# Patient Record
Sex: Female | Born: 1935 | Race: White | Hispanic: No | Smoking: Former smoker
Health system: Southern US, Community
[De-identification: ages and names within clinical notes are randomized; demographics above are authoritative.]

## PROBLEM LIST (undated history)

## (undated) DIAGNOSIS — C801 Malignant (primary) neoplasm, unspecified: Secondary | ICD-10-CM

## (undated) DIAGNOSIS — M1711 Unilateral primary osteoarthritis, right knee: Secondary | ICD-10-CM

## (undated) DIAGNOSIS — M1990 Unspecified osteoarthritis, unspecified site: Secondary | ICD-10-CM

## (undated) DIAGNOSIS — J45909 Unspecified asthma, uncomplicated: Secondary | ICD-10-CM

## (undated) DIAGNOSIS — R05 Cough: Secondary | ICD-10-CM

## (undated) DIAGNOSIS — K5790 Diverticulosis of intestine, part unspecified, without perforation or abscess without bleeding: Secondary | ICD-10-CM

## (undated) DIAGNOSIS — G4730 Sleep apnea, unspecified: Secondary | ICD-10-CM

## (undated) DIAGNOSIS — K589 Irritable bowel syndrome without diarrhea: Secondary | ICD-10-CM

## (undated) DIAGNOSIS — M1712 Unilateral primary osteoarthritis, left knee: Secondary | ICD-10-CM

## (undated) DIAGNOSIS — H269 Unspecified cataract: Secondary | ICD-10-CM

## (undated) DIAGNOSIS — M47816 Spondylosis without myelopathy or radiculopathy, lumbar region: Secondary | ICD-10-CM

## (undated) DIAGNOSIS — R053 Chronic cough: Secondary | ICD-10-CM

## (undated) DIAGNOSIS — C8300 Small cell B-cell lymphoma, unspecified site: Secondary | ICD-10-CM

## (undated) HISTORY — DX: Unilateral primary osteoarthritis, right knee: M17.11

## (undated) HISTORY — DX: Cough: R05

## (undated) HISTORY — PX: KNEE ARTHROSCOPY: SUR90

## (undated) HISTORY — DX: Spondylosis without myelopathy or radiculopathy, lumbar region: M47.816

## (undated) HISTORY — DX: Unilateral primary osteoarthritis, left knee: M17.12

## (undated) HISTORY — PX: TONSILLECTOMY AND ADENOIDECTOMY: SUR1326

## (undated) HISTORY — DX: Unspecified asthma, uncomplicated: J45.909

## (undated) HISTORY — DX: Unspecified osteoarthritis, unspecified site: M19.90

## (undated) HISTORY — DX: Chronic cough: R05.3

## (undated) HISTORY — PX: EYE SURGERY: SHX253

## (undated) HISTORY — DX: Irritable bowel syndrome, unspecified: K58.9

## (undated) HISTORY — DX: Diverticulosis of intestine, part unspecified, without perforation or abscess without bleeding: K57.90

## (undated) HISTORY — DX: Small cell B-cell lymphoma, unspecified site: C83.00

## (undated) HISTORY — DX: Unspecified cataract: H26.9

---

## 1982-08-03 HISTORY — PX: ABDOMINAL HYSTERECTOMY: SHX81

## 2002-08-03 DIAGNOSIS — C8300 Small cell B-cell lymphoma, unspecified site: Secondary | ICD-10-CM

## 2002-08-03 HISTORY — DX: Small cell B-cell lymphoma, unspecified site: C83.00

## 2011-08-26 DIAGNOSIS — IMO0002 Reserved for concepts with insufficient information to code with codable children: Secondary | ICD-10-CM | POA: Diagnosis not present

## 2011-08-26 DIAGNOSIS — IMO0001 Reserved for inherently not codable concepts without codable children: Secondary | ICD-10-CM | POA: Diagnosis not present

## 2011-09-09 DIAGNOSIS — IMO0001 Reserved for inherently not codable concepts without codable children: Secondary | ICD-10-CM | POA: Diagnosis not present

## 2011-09-09 DIAGNOSIS — IMO0002 Reserved for concepts with insufficient information to code with codable children: Secondary | ICD-10-CM | POA: Diagnosis not present

## 2011-10-01 DIAGNOSIS — E7800 Pure hypercholesterolemia, unspecified: Secondary | ICD-10-CM | POA: Diagnosis not present

## 2011-10-01 DIAGNOSIS — R32 Unspecified urinary incontinence: Secondary | ICD-10-CM | POA: Diagnosis not present

## 2011-10-01 DIAGNOSIS — I10 Essential (primary) hypertension: Secondary | ICD-10-CM | POA: Diagnosis not present

## 2011-10-01 DIAGNOSIS — C8299 Follicular lymphoma, unspecified, extranodal and solid organ sites: Secondary | ICD-10-CM | POA: Diagnosis not present

## 2011-10-12 DIAGNOSIS — M25569 Pain in unspecified knee: Secondary | ICD-10-CM | POA: Diagnosis not present

## 2011-10-12 DIAGNOSIS — M19049 Primary osteoarthritis, unspecified hand: Secondary | ICD-10-CM | POA: Diagnosis not present

## 2011-10-12 DIAGNOSIS — IMO0002 Reserved for concepts with insufficient information to code with codable children: Secondary | ICD-10-CM | POA: Diagnosis not present

## 2011-10-19 DIAGNOSIS — IMO0002 Reserved for concepts with insufficient information to code with codable children: Secondary | ICD-10-CM | POA: Diagnosis not present

## 2011-10-19 DIAGNOSIS — IMO0001 Reserved for inherently not codable concepts without codable children: Secondary | ICD-10-CM | POA: Diagnosis not present

## 2011-10-21 DIAGNOSIS — M25569 Pain in unspecified knee: Secondary | ICD-10-CM | POA: Diagnosis not present

## 2011-10-21 DIAGNOSIS — IMO0002 Reserved for concepts with insufficient information to code with codable children: Secondary | ICD-10-CM | POA: Diagnosis not present

## 2011-10-21 DIAGNOSIS — M79609 Pain in unspecified limb: Secondary | ICD-10-CM | POA: Diagnosis not present

## 2011-10-21 DIAGNOSIS — Z87898 Personal history of other specified conditions: Secondary | ICD-10-CM | POA: Diagnosis not present

## 2011-10-21 DIAGNOSIS — M25869 Other specified joint disorders, unspecified knee: Secondary | ICD-10-CM | POA: Diagnosis not present

## 2011-10-22 DIAGNOSIS — IMO0002 Reserved for concepts with insufficient information to code with codable children: Secondary | ICD-10-CM | POA: Diagnosis not present

## 2011-10-22 DIAGNOSIS — M23302 Other meniscus derangements, unspecified lateral meniscus, unspecified knee: Secondary | ICD-10-CM | POA: Diagnosis not present

## 2011-10-22 DIAGNOSIS — M2240 Chondromalacia patellae, unspecified knee: Secondary | ICD-10-CM | POA: Diagnosis not present

## 2011-10-26 DIAGNOSIS — M23329 Other meniscus derangements, posterior horn of medial meniscus, unspecified knee: Secondary | ICD-10-CM | POA: Diagnosis not present

## 2011-10-30 DIAGNOSIS — IMO0002 Reserved for concepts with insufficient information to code with codable children: Secondary | ICD-10-CM | POA: Diagnosis not present

## 2011-10-30 DIAGNOSIS — K219 Gastro-esophageal reflux disease without esophagitis: Secondary | ICD-10-CM | POA: Diagnosis not present

## 2011-10-30 DIAGNOSIS — J449 Chronic obstructive pulmonary disease, unspecified: Secondary | ICD-10-CM | POA: Diagnosis not present

## 2011-10-30 DIAGNOSIS — S83289A Other tear of lateral meniscus, current injury, unspecified knee, initial encounter: Secondary | ICD-10-CM | POA: Diagnosis not present

## 2011-10-30 DIAGNOSIS — Z87891 Personal history of nicotine dependence: Secondary | ICD-10-CM | POA: Diagnosis not present

## 2011-10-30 DIAGNOSIS — Z79899 Other long term (current) drug therapy: Secondary | ICD-10-CM | POA: Diagnosis not present

## 2011-10-30 DIAGNOSIS — G4733 Obstructive sleep apnea (adult) (pediatric): Secondary | ICD-10-CM | POA: Diagnosis not present

## 2011-10-30 DIAGNOSIS — M2240 Chondromalacia patellae, unspecified knee: Secondary | ICD-10-CM | POA: Diagnosis not present

## 2011-10-30 DIAGNOSIS — I10 Essential (primary) hypertension: Secondary | ICD-10-CM | POA: Diagnosis not present

## 2011-10-30 DIAGNOSIS — E119 Type 2 diabetes mellitus without complications: Secondary | ICD-10-CM | POA: Diagnosis not present

## 2011-10-30 DIAGNOSIS — Z888 Allergy status to other drugs, medicaments and biological substances status: Secondary | ICD-10-CM | POA: Diagnosis not present

## 2011-10-30 DIAGNOSIS — Z8701 Personal history of pneumonia (recurrent): Secondary | ICD-10-CM | POA: Diagnosis not present

## 2011-10-30 DIAGNOSIS — I4510 Unspecified right bundle-branch block: Secondary | ICD-10-CM | POA: Diagnosis not present

## 2011-10-30 DIAGNOSIS — Z882 Allergy status to sulfonamides status: Secondary | ICD-10-CM | POA: Diagnosis not present

## 2011-10-30 DIAGNOSIS — Z9221 Personal history of antineoplastic chemotherapy: Secondary | ICD-10-CM | POA: Diagnosis not present

## 2011-10-30 DIAGNOSIS — Z7982 Long term (current) use of aspirin: Secondary | ICD-10-CM | POA: Diagnosis not present

## 2011-10-30 DIAGNOSIS — H269 Unspecified cataract: Secondary | ICD-10-CM | POA: Diagnosis not present

## 2011-10-30 DIAGNOSIS — C8589 Other specified types of non-Hodgkin lymphoma, extranodal and solid organ sites: Secondary | ICD-10-CM | POA: Diagnosis not present

## 2011-10-30 DIAGNOSIS — B029 Zoster without complications: Secondary | ICD-10-CM | POA: Diagnosis not present

## 2011-11-06 DIAGNOSIS — S83289A Other tear of lateral meniscus, current injury, unspecified knee, initial encounter: Secondary | ICD-10-CM | POA: Diagnosis not present

## 2011-11-06 DIAGNOSIS — M23349 Other meniscus derangements, anterior horn of lateral meniscus, unspecified knee: Secondary | ICD-10-CM | POA: Diagnosis not present

## 2011-11-06 DIAGNOSIS — IMO0002 Reserved for concepts with insufficient information to code with codable children: Secondary | ICD-10-CM | POA: Diagnosis not present

## 2011-11-06 DIAGNOSIS — Z6841 Body Mass Index (BMI) 40.0 and over, adult: Secondary | ICD-10-CM | POA: Diagnosis not present

## 2011-11-06 DIAGNOSIS — M2240 Chondromalacia patellae, unspecified knee: Secondary | ICD-10-CM | POA: Diagnosis not present

## 2011-11-06 DIAGNOSIS — M23329 Other meniscus derangements, posterior horn of medial meniscus, unspecified knee: Secondary | ICD-10-CM | POA: Diagnosis not present

## 2011-11-06 DIAGNOSIS — I10 Essential (primary) hypertension: Secondary | ICD-10-CM | POA: Diagnosis not present

## 2011-11-06 DIAGNOSIS — J449 Chronic obstructive pulmonary disease, unspecified: Secondary | ICD-10-CM | POA: Diagnosis not present

## 2011-11-06 DIAGNOSIS — G4733 Obstructive sleep apnea (adult) (pediatric): Secondary | ICD-10-CM | POA: Diagnosis not present

## 2011-11-06 DIAGNOSIS — M23359 Other meniscus derangements, posterior horn of lateral meniscus, unspecified knee: Secondary | ICD-10-CM | POA: Diagnosis not present

## 2011-11-07 DIAGNOSIS — I10 Essential (primary) hypertension: Secondary | ICD-10-CM | POA: Diagnosis not present

## 2011-11-07 DIAGNOSIS — S83289A Other tear of lateral meniscus, current injury, unspecified knee, initial encounter: Secondary | ICD-10-CM | POA: Diagnosis not present

## 2011-11-07 DIAGNOSIS — G4733 Obstructive sleep apnea (adult) (pediatric): Secondary | ICD-10-CM | POA: Diagnosis not present

## 2011-11-07 DIAGNOSIS — IMO0002 Reserved for concepts with insufficient information to code with codable children: Secondary | ICD-10-CM | POA: Diagnosis not present

## 2011-11-07 DIAGNOSIS — J449 Chronic obstructive pulmonary disease, unspecified: Secondary | ICD-10-CM | POA: Diagnosis not present

## 2011-11-07 DIAGNOSIS — M2240 Chondromalacia patellae, unspecified knee: Secondary | ICD-10-CM | POA: Diagnosis not present

## 2011-11-09 DIAGNOSIS — Z4789 Encounter for other orthopedic aftercare: Secondary | ICD-10-CM | POA: Diagnosis not present

## 2011-11-09 DIAGNOSIS — Z9981 Dependence on supplemental oxygen: Secondary | ICD-10-CM | POA: Diagnosis not present

## 2011-11-09 DIAGNOSIS — C8589 Other specified types of non-Hodgkin lymphoma, extranodal and solid organ sites: Secondary | ICD-10-CM | POA: Diagnosis not present

## 2011-11-09 DIAGNOSIS — IMO0001 Reserved for inherently not codable concepts without codable children: Secondary | ICD-10-CM | POA: Diagnosis not present

## 2011-11-09 DIAGNOSIS — M1710 Unilateral primary osteoarthritis, unspecified knee: Secondary | ICD-10-CM | POA: Diagnosis not present

## 2011-11-11 DIAGNOSIS — M1710 Unilateral primary osteoarthritis, unspecified knee: Secondary | ICD-10-CM | POA: Diagnosis not present

## 2011-11-11 DIAGNOSIS — C8589 Other specified types of non-Hodgkin lymphoma, extranodal and solid organ sites: Secondary | ICD-10-CM | POA: Diagnosis not present

## 2011-11-11 DIAGNOSIS — Z4789 Encounter for other orthopedic aftercare: Secondary | ICD-10-CM | POA: Diagnosis not present

## 2011-11-11 DIAGNOSIS — Z9981 Dependence on supplemental oxygen: Secondary | ICD-10-CM | POA: Diagnosis not present

## 2011-11-11 DIAGNOSIS — IMO0001 Reserved for inherently not codable concepts without codable children: Secondary | ICD-10-CM | POA: Diagnosis not present

## 2011-11-13 DIAGNOSIS — Z87891 Personal history of nicotine dependence: Secondary | ICD-10-CM | POA: Diagnosis not present

## 2011-11-13 DIAGNOSIS — S8990XA Unspecified injury of unspecified lower leg, initial encounter: Secondary | ICD-10-CM | POA: Diagnosis not present

## 2011-11-13 DIAGNOSIS — Z87898 Personal history of other specified conditions: Secondary | ICD-10-CM | POA: Diagnosis not present

## 2011-11-13 DIAGNOSIS — E538 Deficiency of other specified B group vitamins: Secondary | ICD-10-CM | POA: Diagnosis not present

## 2011-11-13 DIAGNOSIS — R0609 Other forms of dyspnea: Secondary | ICD-10-CM | POA: Diagnosis not present

## 2011-11-13 DIAGNOSIS — A399 Meningococcal infection, unspecified: Secondary | ICD-10-CM | POA: Diagnosis not present

## 2011-11-13 DIAGNOSIS — R0902 Hypoxemia: Secondary | ICD-10-CM | POA: Diagnosis not present

## 2011-11-13 DIAGNOSIS — Z4789 Encounter for other orthopedic aftercare: Secondary | ICD-10-CM | POA: Diagnosis not present

## 2011-11-13 DIAGNOSIS — M1990 Unspecified osteoarthritis, unspecified site: Secondary | ICD-10-CM | POA: Diagnosis present

## 2011-11-13 DIAGNOSIS — E785 Hyperlipidemia, unspecified: Secondary | ICD-10-CM | POA: Diagnosis present

## 2011-11-13 DIAGNOSIS — Z90710 Acquired absence of both cervix and uterus: Secondary | ICD-10-CM | POA: Diagnosis not present

## 2011-11-13 DIAGNOSIS — K219 Gastro-esophageal reflux disease without esophagitis: Secondary | ICD-10-CM | POA: Diagnosis present

## 2011-11-13 DIAGNOSIS — R0989 Other specified symptoms and signs involving the circulatory and respiratory systems: Secondary | ICD-10-CM | POA: Diagnosis not present

## 2011-11-13 DIAGNOSIS — I10 Essential (primary) hypertension: Secondary | ICD-10-CM | POA: Diagnosis present

## 2011-11-13 DIAGNOSIS — G9340 Encephalopathy, unspecified: Secondary | ICD-10-CM | POA: Diagnosis not present

## 2011-11-13 DIAGNOSIS — E559 Vitamin D deficiency, unspecified: Secondary | ICD-10-CM | POA: Diagnosis present

## 2011-11-13 DIAGNOSIS — Z8614 Personal history of Methicillin resistant Staphylococcus aureus infection: Secondary | ICD-10-CM | POA: Diagnosis not present

## 2011-11-13 DIAGNOSIS — R4182 Altered mental status, unspecified: Secondary | ICD-10-CM | POA: Diagnosis not present

## 2011-11-13 DIAGNOSIS — M1710 Unilateral primary osteoarthritis, unspecified knee: Secondary | ICD-10-CM | POA: Diagnosis not present

## 2011-11-13 DIAGNOSIS — R319 Hematuria, unspecified: Secondary | ICD-10-CM | POA: Diagnosis present

## 2011-11-13 DIAGNOSIS — G4733 Obstructive sleep apnea (adult) (pediatric): Secondary | ICD-10-CM | POA: Diagnosis present

## 2011-11-13 DIAGNOSIS — Z9981 Dependence on supplemental oxygen: Secondary | ICD-10-CM | POA: Diagnosis not present

## 2011-11-13 DIAGNOSIS — Z96659 Presence of unspecified artificial knee joint: Secondary | ICD-10-CM | POA: Diagnosis not present

## 2011-11-13 DIAGNOSIS — R5381 Other malaise: Secondary | ICD-10-CM | POA: Diagnosis not present

## 2011-11-13 DIAGNOSIS — M25569 Pain in unspecified knee: Secondary | ICD-10-CM | POA: Diagnosis not present

## 2011-11-13 DIAGNOSIS — Z86010 Personal history of colonic polyps: Secondary | ICD-10-CM | POA: Diagnosis not present

## 2011-11-13 DIAGNOSIS — IMO0001 Reserved for inherently not codable concepts without codable children: Secondary | ICD-10-CM | POA: Diagnosis not present

## 2011-11-13 DIAGNOSIS — R5383 Other fatigue: Secondary | ICD-10-CM | POA: Diagnosis not present

## 2011-11-13 DIAGNOSIS — R509 Fever, unspecified: Secondary | ICD-10-CM | POA: Diagnosis not present

## 2011-11-13 DIAGNOSIS — L4050 Arthropathic psoriasis, unspecified: Secondary | ICD-10-CM | POA: Diagnosis present

## 2011-11-13 DIAGNOSIS — J189 Pneumonia, unspecified organism: Secondary | ICD-10-CM | POA: Diagnosis not present

## 2011-11-13 DIAGNOSIS — Z9079 Acquired absence of other genital organ(s): Secondary | ICD-10-CM | POA: Diagnosis not present

## 2011-11-13 DIAGNOSIS — C8589 Other specified types of non-Hodgkin lymphoma, extranodal and solid organ sites: Secondary | ICD-10-CM | POA: Diagnosis not present

## 2011-11-17 DIAGNOSIS — H547 Unspecified visual loss: Secondary | ICD-10-CM | POA: Diagnosis not present

## 2011-11-17 DIAGNOSIS — I10 Essential (primary) hypertension: Secondary | ICD-10-CM | POA: Diagnosis not present

## 2011-11-17 DIAGNOSIS — R269 Unspecified abnormalities of gait and mobility: Secondary | ICD-10-CM | POA: Diagnosis not present

## 2011-11-17 DIAGNOSIS — M6281 Muscle weakness (generalized): Secondary | ICD-10-CM | POA: Diagnosis not present

## 2011-11-17 DIAGNOSIS — J189 Pneumonia, unspecified organism: Secondary | ICD-10-CM | POA: Diagnosis not present

## 2011-11-17 DIAGNOSIS — R32 Unspecified urinary incontinence: Secondary | ICD-10-CM | POA: Diagnosis not present

## 2011-11-17 DIAGNOSIS — H9190 Unspecified hearing loss, unspecified ear: Secondary | ICD-10-CM | POA: Diagnosis not present

## 2011-11-17 DIAGNOSIS — M1710 Unilateral primary osteoarthritis, unspecified knee: Secondary | ICD-10-CM | POA: Diagnosis not present

## 2011-11-18 DIAGNOSIS — M6281 Muscle weakness (generalized): Secondary | ICD-10-CM | POA: Diagnosis not present

## 2011-11-18 DIAGNOSIS — J189 Pneumonia, unspecified organism: Secondary | ICD-10-CM | POA: Diagnosis not present

## 2011-11-18 DIAGNOSIS — R269 Unspecified abnormalities of gait and mobility: Secondary | ICD-10-CM | POA: Diagnosis not present

## 2011-11-18 DIAGNOSIS — I10 Essential (primary) hypertension: Secondary | ICD-10-CM | POA: Diagnosis not present

## 2011-11-18 DIAGNOSIS — R32 Unspecified urinary incontinence: Secondary | ICD-10-CM | POA: Diagnosis not present

## 2011-11-18 DIAGNOSIS — M1710 Unilateral primary osteoarthritis, unspecified knee: Secondary | ICD-10-CM | POA: Diagnosis not present

## 2011-11-19 DIAGNOSIS — Z79899 Other long term (current) drug therapy: Secondary | ICD-10-CM | POA: Diagnosis not present

## 2011-11-19 DIAGNOSIS — R609 Edema, unspecified: Secondary | ICD-10-CM | POA: Diagnosis not present

## 2011-11-20 DIAGNOSIS — M1710 Unilateral primary osteoarthritis, unspecified knee: Secondary | ICD-10-CM | POA: Diagnosis not present

## 2011-11-20 DIAGNOSIS — M6281 Muscle weakness (generalized): Secondary | ICD-10-CM | POA: Diagnosis not present

## 2011-11-20 DIAGNOSIS — I10 Essential (primary) hypertension: Secondary | ICD-10-CM | POA: Diagnosis not present

## 2011-11-20 DIAGNOSIS — R269 Unspecified abnormalities of gait and mobility: Secondary | ICD-10-CM | POA: Diagnosis not present

## 2011-11-20 DIAGNOSIS — R32 Unspecified urinary incontinence: Secondary | ICD-10-CM | POA: Diagnosis not present

## 2011-11-20 DIAGNOSIS — J189 Pneumonia, unspecified organism: Secondary | ICD-10-CM | POA: Diagnosis not present

## 2011-11-23 DIAGNOSIS — I10 Essential (primary) hypertension: Secondary | ICD-10-CM | POA: Diagnosis not present

## 2011-11-23 DIAGNOSIS — R32 Unspecified urinary incontinence: Secondary | ICD-10-CM | POA: Diagnosis not present

## 2011-11-23 DIAGNOSIS — M6281 Muscle weakness (generalized): Secondary | ICD-10-CM | POA: Diagnosis not present

## 2011-11-23 DIAGNOSIS — J189 Pneumonia, unspecified organism: Secondary | ICD-10-CM | POA: Diagnosis not present

## 2011-11-23 DIAGNOSIS — M1710 Unilateral primary osteoarthritis, unspecified knee: Secondary | ICD-10-CM | POA: Diagnosis not present

## 2011-11-23 DIAGNOSIS — R269 Unspecified abnormalities of gait and mobility: Secondary | ICD-10-CM | POA: Diagnosis not present

## 2011-11-25 DIAGNOSIS — I10 Essential (primary) hypertension: Secondary | ICD-10-CM | POA: Diagnosis not present

## 2011-11-25 DIAGNOSIS — R269 Unspecified abnormalities of gait and mobility: Secondary | ICD-10-CM | POA: Diagnosis not present

## 2011-11-25 DIAGNOSIS — J189 Pneumonia, unspecified organism: Secondary | ICD-10-CM | POA: Diagnosis not present

## 2011-11-25 DIAGNOSIS — R32 Unspecified urinary incontinence: Secondary | ICD-10-CM | POA: Diagnosis not present

## 2011-11-25 DIAGNOSIS — M1710 Unilateral primary osteoarthritis, unspecified knee: Secondary | ICD-10-CM | POA: Diagnosis not present

## 2011-11-25 DIAGNOSIS — M6281 Muscle weakness (generalized): Secondary | ICD-10-CM | POA: Diagnosis not present

## 2011-11-26 DIAGNOSIS — Z09 Encounter for follow-up examination after completed treatment for conditions other than malignant neoplasm: Secondary | ICD-10-CM | POA: Diagnosis not present

## 2011-11-26 DIAGNOSIS — M25569 Pain in unspecified knee: Secondary | ICD-10-CM | POA: Diagnosis not present

## 2011-11-27 DIAGNOSIS — M1710 Unilateral primary osteoarthritis, unspecified knee: Secondary | ICD-10-CM | POA: Diagnosis not present

## 2011-11-27 DIAGNOSIS — R269 Unspecified abnormalities of gait and mobility: Secondary | ICD-10-CM | POA: Diagnosis not present

## 2011-11-27 DIAGNOSIS — R32 Unspecified urinary incontinence: Secondary | ICD-10-CM | POA: Diagnosis not present

## 2011-11-27 DIAGNOSIS — I10 Essential (primary) hypertension: Secondary | ICD-10-CM | POA: Diagnosis not present

## 2011-11-27 DIAGNOSIS — J189 Pneumonia, unspecified organism: Secondary | ICD-10-CM | POA: Diagnosis not present

## 2011-11-27 DIAGNOSIS — M6281 Muscle weakness (generalized): Secondary | ICD-10-CM | POA: Diagnosis not present

## 2011-11-30 DIAGNOSIS — M6281 Muscle weakness (generalized): Secondary | ICD-10-CM | POA: Diagnosis not present

## 2011-11-30 DIAGNOSIS — J189 Pneumonia, unspecified organism: Secondary | ICD-10-CM | POA: Diagnosis not present

## 2011-11-30 DIAGNOSIS — I10 Essential (primary) hypertension: Secondary | ICD-10-CM | POA: Diagnosis not present

## 2011-11-30 DIAGNOSIS — R269 Unspecified abnormalities of gait and mobility: Secondary | ICD-10-CM | POA: Diagnosis not present

## 2011-11-30 DIAGNOSIS — R32 Unspecified urinary incontinence: Secondary | ICD-10-CM | POA: Diagnosis not present

## 2011-11-30 DIAGNOSIS — M1710 Unilateral primary osteoarthritis, unspecified knee: Secondary | ICD-10-CM | POA: Diagnosis not present

## 2011-12-01 DIAGNOSIS — R269 Unspecified abnormalities of gait and mobility: Secondary | ICD-10-CM | POA: Diagnosis not present

## 2011-12-01 DIAGNOSIS — M6281 Muscle weakness (generalized): Secondary | ICD-10-CM | POA: Diagnosis not present

## 2011-12-01 DIAGNOSIS — I10 Essential (primary) hypertension: Secondary | ICD-10-CM | POA: Diagnosis not present

## 2011-12-01 DIAGNOSIS — R32 Unspecified urinary incontinence: Secondary | ICD-10-CM | POA: Diagnosis not present

## 2011-12-01 DIAGNOSIS — J189 Pneumonia, unspecified organism: Secondary | ICD-10-CM | POA: Diagnosis not present

## 2011-12-01 DIAGNOSIS — M1710 Unilateral primary osteoarthritis, unspecified knee: Secondary | ICD-10-CM | POA: Diagnosis not present

## 2011-12-02 DIAGNOSIS — M1710 Unilateral primary osteoarthritis, unspecified knee: Secondary | ICD-10-CM | POA: Diagnosis not present

## 2011-12-02 DIAGNOSIS — J189 Pneumonia, unspecified organism: Secondary | ICD-10-CM | POA: Diagnosis not present

## 2011-12-02 DIAGNOSIS — M6281 Muscle weakness (generalized): Secondary | ICD-10-CM | POA: Diagnosis not present

## 2011-12-02 DIAGNOSIS — R269 Unspecified abnormalities of gait and mobility: Secondary | ICD-10-CM | POA: Diagnosis not present

## 2011-12-02 DIAGNOSIS — R32 Unspecified urinary incontinence: Secondary | ICD-10-CM | POA: Diagnosis not present

## 2011-12-02 DIAGNOSIS — I10 Essential (primary) hypertension: Secondary | ICD-10-CM | POA: Diagnosis not present

## 2011-12-03 DIAGNOSIS — I10 Essential (primary) hypertension: Secondary | ICD-10-CM | POA: Diagnosis not present

## 2011-12-03 DIAGNOSIS — D649 Anemia, unspecified: Secondary | ICD-10-CM | POA: Diagnosis not present

## 2011-12-03 DIAGNOSIS — J189 Pneumonia, unspecified organism: Secondary | ICD-10-CM | POA: Diagnosis not present

## 2011-12-04 DIAGNOSIS — R32 Unspecified urinary incontinence: Secondary | ICD-10-CM | POA: Diagnosis not present

## 2011-12-04 DIAGNOSIS — R269 Unspecified abnormalities of gait and mobility: Secondary | ICD-10-CM | POA: Diagnosis not present

## 2011-12-04 DIAGNOSIS — J189 Pneumonia, unspecified organism: Secondary | ICD-10-CM | POA: Diagnosis not present

## 2011-12-04 DIAGNOSIS — M1710 Unilateral primary osteoarthritis, unspecified knee: Secondary | ICD-10-CM | POA: Diagnosis not present

## 2011-12-04 DIAGNOSIS — I10 Essential (primary) hypertension: Secondary | ICD-10-CM | POA: Diagnosis not present

## 2011-12-04 DIAGNOSIS — M6281 Muscle weakness (generalized): Secondary | ICD-10-CM | POA: Diagnosis not present

## 2011-12-05 DIAGNOSIS — J189 Pneumonia, unspecified organism: Secondary | ICD-10-CM | POA: Diagnosis not present

## 2011-12-05 DIAGNOSIS — M1710 Unilateral primary osteoarthritis, unspecified knee: Secondary | ICD-10-CM | POA: Diagnosis not present

## 2011-12-05 DIAGNOSIS — I10 Essential (primary) hypertension: Secondary | ICD-10-CM | POA: Diagnosis not present

## 2011-12-05 DIAGNOSIS — R269 Unspecified abnormalities of gait and mobility: Secondary | ICD-10-CM | POA: Diagnosis not present

## 2011-12-05 DIAGNOSIS — M6281 Muscle weakness (generalized): Secondary | ICD-10-CM | POA: Diagnosis not present

## 2011-12-05 DIAGNOSIS — R32 Unspecified urinary incontinence: Secondary | ICD-10-CM | POA: Diagnosis not present

## 2011-12-08 DIAGNOSIS — M6281 Muscle weakness (generalized): Secondary | ICD-10-CM | POA: Diagnosis not present

## 2011-12-08 DIAGNOSIS — J189 Pneumonia, unspecified organism: Secondary | ICD-10-CM | POA: Diagnosis not present

## 2011-12-08 DIAGNOSIS — R269 Unspecified abnormalities of gait and mobility: Secondary | ICD-10-CM | POA: Diagnosis not present

## 2011-12-08 DIAGNOSIS — M1710 Unilateral primary osteoarthritis, unspecified knee: Secondary | ICD-10-CM | POA: Diagnosis not present

## 2011-12-08 DIAGNOSIS — I10 Essential (primary) hypertension: Secondary | ICD-10-CM | POA: Diagnosis not present

## 2011-12-08 DIAGNOSIS — R32 Unspecified urinary incontinence: Secondary | ICD-10-CM | POA: Diagnosis not present

## 2011-12-10 DIAGNOSIS — I10 Essential (primary) hypertension: Secondary | ICD-10-CM | POA: Diagnosis not present

## 2011-12-10 DIAGNOSIS — M6281 Muscle weakness (generalized): Secondary | ICD-10-CM | POA: Diagnosis not present

## 2011-12-10 DIAGNOSIS — M1710 Unilateral primary osteoarthritis, unspecified knee: Secondary | ICD-10-CM | POA: Diagnosis not present

## 2011-12-10 DIAGNOSIS — J189 Pneumonia, unspecified organism: Secondary | ICD-10-CM | POA: Diagnosis not present

## 2011-12-10 DIAGNOSIS — R32 Unspecified urinary incontinence: Secondary | ICD-10-CM | POA: Diagnosis not present

## 2011-12-10 DIAGNOSIS — R269 Unspecified abnormalities of gait and mobility: Secondary | ICD-10-CM | POA: Diagnosis not present

## 2011-12-14 DIAGNOSIS — M6281 Muscle weakness (generalized): Secondary | ICD-10-CM | POA: Diagnosis not present

## 2011-12-14 DIAGNOSIS — M1710 Unilateral primary osteoarthritis, unspecified knee: Secondary | ICD-10-CM | POA: Diagnosis not present

## 2011-12-14 DIAGNOSIS — J189 Pneumonia, unspecified organism: Secondary | ICD-10-CM | POA: Diagnosis not present

## 2011-12-14 DIAGNOSIS — R32 Unspecified urinary incontinence: Secondary | ICD-10-CM | POA: Diagnosis not present

## 2011-12-14 DIAGNOSIS — R269 Unspecified abnormalities of gait and mobility: Secondary | ICD-10-CM | POA: Diagnosis not present

## 2011-12-14 DIAGNOSIS — I10 Essential (primary) hypertension: Secondary | ICD-10-CM | POA: Diagnosis not present

## 2011-12-15 DIAGNOSIS — M1710 Unilateral primary osteoarthritis, unspecified knee: Secondary | ICD-10-CM | POA: Diagnosis not present

## 2011-12-15 DIAGNOSIS — R269 Unspecified abnormalities of gait and mobility: Secondary | ICD-10-CM | POA: Diagnosis not present

## 2011-12-15 DIAGNOSIS — M6281 Muscle weakness (generalized): Secondary | ICD-10-CM | POA: Diagnosis not present

## 2011-12-15 DIAGNOSIS — R32 Unspecified urinary incontinence: Secondary | ICD-10-CM | POA: Diagnosis not present

## 2011-12-15 DIAGNOSIS — J189 Pneumonia, unspecified organism: Secondary | ICD-10-CM | POA: Diagnosis not present

## 2011-12-15 DIAGNOSIS — I10 Essential (primary) hypertension: Secondary | ICD-10-CM | POA: Diagnosis not present

## 2011-12-17 DIAGNOSIS — M1710 Unilateral primary osteoarthritis, unspecified knee: Secondary | ICD-10-CM | POA: Diagnosis not present

## 2011-12-17 DIAGNOSIS — I10 Essential (primary) hypertension: Secondary | ICD-10-CM | POA: Diagnosis not present

## 2011-12-17 DIAGNOSIS — R32 Unspecified urinary incontinence: Secondary | ICD-10-CM | POA: Diagnosis not present

## 2011-12-17 DIAGNOSIS — M6281 Muscle weakness (generalized): Secondary | ICD-10-CM | POA: Diagnosis not present

## 2011-12-17 DIAGNOSIS — J189 Pneumonia, unspecified organism: Secondary | ICD-10-CM | POA: Diagnosis not present

## 2011-12-17 DIAGNOSIS — R269 Unspecified abnormalities of gait and mobility: Secondary | ICD-10-CM | POA: Diagnosis not present

## 2011-12-22 DIAGNOSIS — M6281 Muscle weakness (generalized): Secondary | ICD-10-CM | POA: Diagnosis not present

## 2011-12-22 DIAGNOSIS — R32 Unspecified urinary incontinence: Secondary | ICD-10-CM | POA: Diagnosis not present

## 2011-12-22 DIAGNOSIS — M1710 Unilateral primary osteoarthritis, unspecified knee: Secondary | ICD-10-CM | POA: Diagnosis not present

## 2011-12-22 DIAGNOSIS — J189 Pneumonia, unspecified organism: Secondary | ICD-10-CM | POA: Diagnosis not present

## 2011-12-22 DIAGNOSIS — I10 Essential (primary) hypertension: Secondary | ICD-10-CM | POA: Diagnosis not present

## 2011-12-22 DIAGNOSIS — R269 Unspecified abnormalities of gait and mobility: Secondary | ICD-10-CM | POA: Diagnosis not present

## 2011-12-24 DIAGNOSIS — M6281 Muscle weakness (generalized): Secondary | ICD-10-CM | POA: Diagnosis not present

## 2011-12-24 DIAGNOSIS — R269 Unspecified abnormalities of gait and mobility: Secondary | ICD-10-CM | POA: Diagnosis not present

## 2011-12-24 DIAGNOSIS — J189 Pneumonia, unspecified organism: Secondary | ICD-10-CM | POA: Diagnosis not present

## 2011-12-24 DIAGNOSIS — M1710 Unilateral primary osteoarthritis, unspecified knee: Secondary | ICD-10-CM | POA: Diagnosis not present

## 2011-12-24 DIAGNOSIS — I10 Essential (primary) hypertension: Secondary | ICD-10-CM | POA: Diagnosis not present

## 2011-12-24 DIAGNOSIS — R32 Unspecified urinary incontinence: Secondary | ICD-10-CM | POA: Diagnosis not present

## 2011-12-29 DIAGNOSIS — M1710 Unilateral primary osteoarthritis, unspecified knee: Secondary | ICD-10-CM | POA: Diagnosis not present

## 2011-12-29 DIAGNOSIS — R32 Unspecified urinary incontinence: Secondary | ICD-10-CM | POA: Diagnosis not present

## 2011-12-29 DIAGNOSIS — M6281 Muscle weakness (generalized): Secondary | ICD-10-CM | POA: Diagnosis not present

## 2011-12-29 DIAGNOSIS — I10 Essential (primary) hypertension: Secondary | ICD-10-CM | POA: Diagnosis not present

## 2011-12-29 DIAGNOSIS — J189 Pneumonia, unspecified organism: Secondary | ICD-10-CM | POA: Diagnosis not present

## 2011-12-29 DIAGNOSIS — R269 Unspecified abnormalities of gait and mobility: Secondary | ICD-10-CM | POA: Diagnosis not present

## 2011-12-31 DIAGNOSIS — J189 Pneumonia, unspecified organism: Secondary | ICD-10-CM | POA: Diagnosis not present

## 2011-12-31 DIAGNOSIS — M6281 Muscle weakness (generalized): Secondary | ICD-10-CM | POA: Diagnosis not present

## 2011-12-31 DIAGNOSIS — M1710 Unilateral primary osteoarthritis, unspecified knee: Secondary | ICD-10-CM | POA: Diagnosis not present

## 2011-12-31 DIAGNOSIS — I10 Essential (primary) hypertension: Secondary | ICD-10-CM | POA: Diagnosis not present

## 2011-12-31 DIAGNOSIS — R269 Unspecified abnormalities of gait and mobility: Secondary | ICD-10-CM | POA: Diagnosis not present

## 2011-12-31 DIAGNOSIS — R32 Unspecified urinary incontinence: Secondary | ICD-10-CM | POA: Diagnosis not present

## 2012-01-01 DIAGNOSIS — C8299 Follicular lymphoma, unspecified, extranodal and solid organ sites: Secondary | ICD-10-CM | POA: Diagnosis not present

## 2012-01-05 DIAGNOSIS — J189 Pneumonia, unspecified organism: Secondary | ICD-10-CM | POA: Diagnosis not present

## 2012-01-05 DIAGNOSIS — R609 Edema, unspecified: Secondary | ICD-10-CM | POA: Diagnosis not present

## 2012-01-12 DIAGNOSIS — Z803 Family history of malignant neoplasm of breast: Secondary | ICD-10-CM | POA: Diagnosis not present

## 2012-01-12 DIAGNOSIS — Z1231 Encounter for screening mammogram for malignant neoplasm of breast: Secondary | ICD-10-CM | POA: Diagnosis not present

## 2012-01-15 DIAGNOSIS — H259 Unspecified age-related cataract: Secondary | ICD-10-CM | POA: Diagnosis not present

## 2012-01-15 DIAGNOSIS — H52209 Unspecified astigmatism, unspecified eye: Secondary | ICD-10-CM | POA: Diagnosis not present

## 2012-02-22 DIAGNOSIS — D485 Neoplasm of uncertain behavior of skin: Secondary | ICD-10-CM | POA: Diagnosis not present

## 2012-02-22 DIAGNOSIS — Z85828 Personal history of other malignant neoplasm of skin: Secondary | ICD-10-CM | POA: Diagnosis not present

## 2012-02-22 DIAGNOSIS — I789 Disease of capillaries, unspecified: Secondary | ICD-10-CM | POA: Diagnosis not present

## 2012-02-22 DIAGNOSIS — C44319 Basal cell carcinoma of skin of other parts of face: Secondary | ICD-10-CM | POA: Diagnosis not present

## 2012-03-14 DIAGNOSIS — C44319 Basal cell carcinoma of skin of other parts of face: Secondary | ICD-10-CM | POA: Diagnosis not present

## 2012-03-28 DIAGNOSIS — H65499 Other chronic nonsuppurative otitis media, unspecified ear: Secondary | ICD-10-CM | POA: Diagnosis not present

## 2012-03-29 DIAGNOSIS — M1710 Unilateral primary osteoarthritis, unspecified knee: Secondary | ICD-10-CM | POA: Diagnosis not present

## 2012-03-29 DIAGNOSIS — M25569 Pain in unspecified knee: Secondary | ICD-10-CM | POA: Diagnosis not present

## 2012-03-31 DIAGNOSIS — C8299 Follicular lymphoma, unspecified, extranodal and solid organ sites: Secondary | ICD-10-CM | POA: Diagnosis not present

## 2012-04-13 ENCOUNTER — Telehealth: Payer: Self-pay | Admitting: Oncology

## 2012-04-13 NOTE — Telephone Encounter (Signed)
LVOM for pt return call abt appt with Dr. Truett Perna

## 2012-04-21 DIAGNOSIS — I10 Essential (primary) hypertension: Secondary | ICD-10-CM | POA: Diagnosis not present

## 2012-04-21 DIAGNOSIS — J40 Bronchitis, not specified as acute or chronic: Secondary | ICD-10-CM | POA: Diagnosis not present

## 2012-05-05 ENCOUNTER — Telehealth: Payer: Self-pay | Admitting: Oncology

## 2012-05-05 NOTE — Telephone Encounter (Signed)
C/D 05/05/12 for appt 06/17/12

## 2012-06-14 DIAGNOSIS — H906 Mixed conductive and sensorineural hearing loss, bilateral: Secondary | ICD-10-CM | POA: Diagnosis not present

## 2012-06-14 DIAGNOSIS — H7290 Unspecified perforation of tympanic membrane, unspecified ear: Secondary | ICD-10-CM | POA: Diagnosis not present

## 2012-06-16 ENCOUNTER — Telehealth: Payer: Self-pay | Admitting: Oncology

## 2012-06-16 NOTE — Telephone Encounter (Signed)
LVOM for pt to return call in re to r/s NP appt.

## 2012-06-17 ENCOUNTER — Ambulatory Visit: Payer: Self-pay | Admitting: Oncology

## 2012-06-17 ENCOUNTER — Ambulatory Visit: Payer: Self-pay

## 2012-06-17 ENCOUNTER — Telehealth: Payer: Self-pay | Admitting: Oncology

## 2012-06-17 DIAGNOSIS — M1710 Unilateral primary osteoarthritis, unspecified knee: Secondary | ICD-10-CM | POA: Diagnosis not present

## 2012-06-17 DIAGNOSIS — IMO0002 Reserved for concepts with insufficient information to code with codable children: Secondary | ICD-10-CM | POA: Diagnosis not present

## 2012-06-17 NOTE — Telephone Encounter (Signed)
S/W pt in re NP appt 11/26 @ 1:30 w/Dr. Truett Perna Calendar mailed out.

## 2012-06-21 DIAGNOSIS — R0602 Shortness of breath: Secondary | ICD-10-CM | POA: Diagnosis not present

## 2012-06-21 DIAGNOSIS — D649 Anemia, unspecified: Secondary | ICD-10-CM | POA: Diagnosis not present

## 2012-06-21 DIAGNOSIS — R609 Edema, unspecified: Secondary | ICD-10-CM | POA: Diagnosis not present

## 2012-06-23 DIAGNOSIS — M1710 Unilateral primary osteoarthritis, unspecified knee: Secondary | ICD-10-CM | POA: Diagnosis not present

## 2012-06-23 DIAGNOSIS — M25569 Pain in unspecified knee: Secondary | ICD-10-CM | POA: Diagnosis not present

## 2012-06-23 DIAGNOSIS — M6281 Muscle weakness (generalized): Secondary | ICD-10-CM | POA: Diagnosis not present

## 2012-06-23 DIAGNOSIS — R269 Unspecified abnormalities of gait and mobility: Secondary | ICD-10-CM | POA: Diagnosis not present

## 2012-06-27 ENCOUNTER — Encounter: Payer: Self-pay | Admitting: *Deleted

## 2012-06-27 ENCOUNTER — Ambulatory Visit: Payer: Self-pay

## 2012-06-27 ENCOUNTER — Ambulatory Visit: Payer: Self-pay | Admitting: Oncology

## 2012-06-27 DIAGNOSIS — R0602 Shortness of breath: Secondary | ICD-10-CM | POA: Diagnosis not present

## 2012-06-28 ENCOUNTER — Ambulatory Visit (HOSPITAL_BASED_OUTPATIENT_CLINIC_OR_DEPARTMENT_OTHER): Payer: Medicare Other | Admitting: Lab

## 2012-06-28 ENCOUNTER — Ambulatory Visit: Payer: Medicare Other

## 2012-06-28 ENCOUNTER — Ambulatory Visit (HOSPITAL_BASED_OUTPATIENT_CLINIC_OR_DEPARTMENT_OTHER): Payer: Medicare Other | Admitting: Oncology

## 2012-06-28 ENCOUNTER — Encounter: Payer: Self-pay | Admitting: Oncology

## 2012-06-28 VITALS — BP 136/70 | HR 79 | Temp 97.7°F | Resp 22 | Ht 66.5 in | Wt 292.6 lb

## 2012-06-28 DIAGNOSIS — R609 Edema, unspecified: Secondary | ICD-10-CM

## 2012-06-28 DIAGNOSIS — M1710 Unilateral primary osteoarthritis, unspecified knee: Secondary | ICD-10-CM

## 2012-06-28 DIAGNOSIS — C880 Waldenstrom macroglobulinemia: Secondary | ICD-10-CM | POA: Diagnosis not present

## 2012-06-28 DIAGNOSIS — G4730 Sleep apnea, unspecified: Secondary | ICD-10-CM

## 2012-06-28 DIAGNOSIS — C8589 Other specified types of non-Hodgkin lymphoma, extranodal and solid organ sites: Secondary | ICD-10-CM

## 2012-06-28 NOTE — Progress Notes (Signed)
[facility] New Patient Consult   Referring MD: Azalee Weimer 78 y.o.  03-16-1931    Reason for Referral: Low-grade non-Hodgkin's lymphoma     HPI: She reports developing a left neck mass in 2004. 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 She was found to have stage IV disease based on bone marrow involvement with low-grade lymphoma. She was initially observed and had progressive disease in the left submandibular gland. She was treated with rituximab for 4 doses between July 2005 and August 2005. There was no significant response. She was subsequently treated with CVP/rituximab in 2007, additional rituximab in 2009, and fludarabine for rituximab resistance in August of 2009 with a transient response.  She was referred to Dr. Greggory Stallion at [facility] and treatment was recommended with pentostatin, Cytoxan, and rituximab. She was treated with this regimen between March of 2010 and may of 2010 for 5 cycles. She developed significant fatigue and a high serum [name] level and elevated serum viscosity.  There is been a durable clinical response following the chemotherapy in 2010. There is a persistently elevated serum M spike and mildly elevated serum viscosity in the 2.0-2.2 range. She remains asymptomatic aside from chronic fatigue.  Ms. Hannay recently relocated to [facility] and now resides with her husband at [facility] home. She denies fever, night sweats, and new palpable lymphadenopathy. There is persistent slight fullness in the left submandibular region. She reports developing diffuse "edema" over the past few weeks. She has been evaluated by cardiology and was placed on furosemide with improvement.  Past Medical History  Diagnosis Date  . Malignant lymphoplasmacytic lymphoma 2004  . Hypertension   . Arthritis-left knee, now followed by Dr. Lequita Halt   . Hypercholesteremia   . IBS (irritable bowel syndrome)   . Diverticulosis of intestine     H/O   .      G0 P0  .     Pneumonia in April 2013  .     Sleep apnea-maintained on CPAP  Past Surgical History  Procedure Date  . Tonsillectomy and adenoidectomy childhood  . Abdominal hysterectomy 1984    TAH    .     Left knee arthroscopy                                                                                       April 2013  Family history: Her sister had breast cancer at age 92. A maternal aunt had breast cancer at age 6 and died at age 17 of breast cancer. No other family history of cancer.  Current outpatient prescriptions:aspirin 81 MG tablet, Take 81 mg by mouth daily., Disp: , Rfl: ;  budesonide-formoterol (SYMBICORT) 80-4.5 MCG/ACT inhaler, Inhale 2 puffs into the lungs 2 (two) times daily., Disp: , Rfl: ;  cyanocobalamin 1000 MCG tablet, Take 100 mcg by mouth daily., Disp: , Rfl: ;  fluticasone (FLONASE) 50 MCG/ACT nasal spray, Place 1 spray into the nose 2 (two) times daily. Each nostril, Disp: , Rfl:  hydrochlorothiazide (HYDRODIURIL) 25 MG tablet, Take 25 mg by mouth daily., Disp: , Rfl: ;  Multiple Vitamin (MULTIVITAMIN) capsule, Take 1  capsule by mouth daily. With Vitamin D 4000, Disp: , Rfl: ;  oxybutynin (DITROPAN) 5 MG tablet, Take 5 mg by mouth 2 (two) times daily., Disp: , Rfl: ;  potassium chloride (K-DUR) 10 MEQ tablet, Take 10 mEq by mouth daily., Disp: , Rfl:  Probiotic Product (PROBIOTIC DAILY PO), Take 1 capsule by mouth daily., Disp: , Rfl:   Allergies:  Allergies  Allergen Reactions  . Codeine Other (See Comments)    Crazy-nightmares  . Erythromycin Other (See Comments)    GI upset  . Sulfa Antibiotics Other (See Comments)    GI upset    Social History: She lives with her husband at [facility], she is a homemaker, she quit smoking cigarettes in 1981, rare alcohol use, she was transfused following chemotherapy, no risk factor for HIV or  ROS:   Positives include: Chronic exertional dyspnea, cough productive of a yellow sputum for the past 10 years, chronic  diarrhea occurring every 4-5 days, recent leg edema-improved with furosemide  A complete ROS was otherwise negative.  Physical Exam:  Blood pressure 136/70, pulse 79, temperature 97.7 F (36.5 C), temperature source Oral, resp. rate 22, height 5' 6.5" (1.689 m), weight 292 lb 9.6 oz (132.722 kg).  HEENT: Oropharynx without visible mass, neck without mass Lungs: Clear bilaterally Cardiac: Regular rate and rhythm Abdomen: No hepatosplenomegaly  Vascular: Trace edema at the left greater than right lower leg Lymph nodes: No supraclavicular, axillary, or inguinal nodes. Slight prominence of the left compared the right submandibular gland and slight fullness posterior to the left subareolar gland without a discrete palpable lymph nodes Neurologic: Alert and oriented, the motor exam appears intact throughout Skin: No rash    LAB:  CBC 03/31/2012-hemoglobin 11, platelets 348,000, white count 7.7, ANC 5.1   06/21/2012-hemoglobin 11.2, platelet 230,000, white count 3.4   CMP 03/31/2012-BUN 19, creatinine 0.8, total protein 9.1, albumin 4.2, LDH 399 (454-098) May 31st 2013-[name] 2730, serum M spike 1.7 immunofixation with an [name] monoclonal kappa protein, serum viscosity 2.0 (1.6-1.9)   Assessment/Plan:   1. Low grade B-cell non-Hodgkin's lymphoma, initially diagnosed in 2004 as a low-grade marginal zone lymphoma and most recently termed as lymphoplasmacytic lymphoma based on a high serum viscosity and an [name] kappa serum M spike.    -Treated with multiple systemic therapies, most recently pentostatin/Cytoxan/rituximab in 2010  2. Left knee arthritis-now followed by Dr.Aluisio  3. Sleep apnea  4. Recent "edema" improved with furosemide, now being evaluated by cardiology   Disposition:   There is no clinical evidence for progression of the non-Hodgkin's lymphoma. Ms. Bowdoin appears asymptomatic from the lymphoma at present. We will continue an observation approach. We obtained an [name]  level and serum viscosity today. She will return for an office and lab visit in 4 months.  Ms. Vanamburg will contact us in the interim for new symptoms.  Kline Bulthuis 06/28/2012, 6:39 PM

## 2012-06-28 NOTE — Progress Notes (Signed)
Checked in new pt with no financial concerns. °

## 2012-07-01 ENCOUNTER — Telehealth: Payer: Self-pay | Admitting: *Deleted

## 2012-07-01 NOTE — Telephone Encounter (Signed)
Stable at 2770 Was 2730 in May

## 2012-07-01 NOTE — Telephone Encounter (Signed)
Message copied by Wandalee Ferdinand on Fri Jul 01, 2012  3:22 PM ------      Message from: Thornton Papas B      Created: Wed Jun 29, 2012  8:03 PM       Please call patient, [name] is stable

## 2012-07-02 LAB — IGM: IgM, Serum: 2770 mg/dL — ABNORMAL HIGH (ref 52–322)

## 2012-07-02 LAB — VISCOSITY, SERUM: Viscosity, Serum: 2.1 rel to H2O — ABNORMAL HIGH (ref 1.5–1.9)

## 2012-07-05 DIAGNOSIS — R269 Unspecified abnormalities of gait and mobility: Secondary | ICD-10-CM | POA: Diagnosis not present

## 2012-07-05 DIAGNOSIS — M25569 Pain in unspecified knee: Secondary | ICD-10-CM | POA: Diagnosis not present

## 2012-07-05 DIAGNOSIS — M6281 Muscle weakness (generalized): Secondary | ICD-10-CM | POA: Diagnosis not present

## 2012-07-05 DIAGNOSIS — M1710 Unilateral primary osteoarthritis, unspecified knee: Secondary | ICD-10-CM | POA: Diagnosis not present

## 2012-07-06 DIAGNOSIS — IMO0002 Reserved for concepts with insufficient information to code with codable children: Secondary | ICD-10-CM | POA: Diagnosis not present

## 2012-07-06 DIAGNOSIS — M1710 Unilateral primary osteoarthritis, unspecified knee: Secondary | ICD-10-CM | POA: Diagnosis not present

## 2012-07-08 DIAGNOSIS — M6281 Muscle weakness (generalized): Secondary | ICD-10-CM | POA: Diagnosis not present

## 2012-07-08 DIAGNOSIS — M25569 Pain in unspecified knee: Secondary | ICD-10-CM | POA: Diagnosis not present

## 2012-07-08 DIAGNOSIS — M1710 Unilateral primary osteoarthritis, unspecified knee: Secondary | ICD-10-CM | POA: Diagnosis not present

## 2012-07-08 DIAGNOSIS — R269 Unspecified abnormalities of gait and mobility: Secondary | ICD-10-CM | POA: Diagnosis not present

## 2012-07-12 DIAGNOSIS — R269 Unspecified abnormalities of gait and mobility: Secondary | ICD-10-CM | POA: Diagnosis not present

## 2012-07-12 DIAGNOSIS — M25569 Pain in unspecified knee: Secondary | ICD-10-CM | POA: Diagnosis not present

## 2012-07-12 DIAGNOSIS — M1710 Unilateral primary osteoarthritis, unspecified knee: Secondary | ICD-10-CM | POA: Diagnosis not present

## 2012-07-12 DIAGNOSIS — M6281 Muscle weakness (generalized): Secondary | ICD-10-CM | POA: Diagnosis not present

## 2012-07-13 DIAGNOSIS — M1710 Unilateral primary osteoarthritis, unspecified knee: Secondary | ICD-10-CM | POA: Diagnosis not present

## 2012-07-13 DIAGNOSIS — IMO0002 Reserved for concepts with insufficient information to code with codable children: Secondary | ICD-10-CM | POA: Diagnosis not present

## 2012-07-15 DIAGNOSIS — M6281 Muscle weakness (generalized): Secondary | ICD-10-CM | POA: Diagnosis not present

## 2012-07-15 DIAGNOSIS — M1710 Unilateral primary osteoarthritis, unspecified knee: Secondary | ICD-10-CM | POA: Diagnosis not present

## 2012-07-15 DIAGNOSIS — R269 Unspecified abnormalities of gait and mobility: Secondary | ICD-10-CM | POA: Diagnosis not present

## 2012-07-15 DIAGNOSIS — M25569 Pain in unspecified knee: Secondary | ICD-10-CM | POA: Diagnosis not present

## 2012-07-21 DIAGNOSIS — IMO0002 Reserved for concepts with insufficient information to code with codable children: Secondary | ICD-10-CM | POA: Diagnosis not present

## 2012-07-21 DIAGNOSIS — M1710 Unilateral primary osteoarthritis, unspecified knee: Secondary | ICD-10-CM | POA: Diagnosis not present

## 2012-08-04 DIAGNOSIS — M25569 Pain in unspecified knee: Secondary | ICD-10-CM | POA: Diagnosis not present

## 2012-08-04 DIAGNOSIS — R269 Unspecified abnormalities of gait and mobility: Secondary | ICD-10-CM | POA: Diagnosis not present

## 2012-08-04 DIAGNOSIS — M1710 Unilateral primary osteoarthritis, unspecified knee: Secondary | ICD-10-CM | POA: Diagnosis not present

## 2012-08-04 DIAGNOSIS — M6281 Muscle weakness (generalized): Secondary | ICD-10-CM | POA: Diagnosis not present

## 2012-08-08 DIAGNOSIS — M6281 Muscle weakness (generalized): Secondary | ICD-10-CM | POA: Diagnosis not present

## 2012-08-08 DIAGNOSIS — M25569 Pain in unspecified knee: Secondary | ICD-10-CM | POA: Diagnosis not present

## 2012-08-08 DIAGNOSIS — R269 Unspecified abnormalities of gait and mobility: Secondary | ICD-10-CM | POA: Diagnosis not present

## 2012-08-08 DIAGNOSIS — M1710 Unilateral primary osteoarthritis, unspecified knee: Secondary | ICD-10-CM | POA: Diagnosis not present

## 2012-08-11 DIAGNOSIS — M6281 Muscle weakness (generalized): Secondary | ICD-10-CM | POA: Diagnosis not present

## 2012-08-11 DIAGNOSIS — M1710 Unilateral primary osteoarthritis, unspecified knee: Secondary | ICD-10-CM | POA: Diagnosis not present

## 2012-08-11 DIAGNOSIS — M25569 Pain in unspecified knee: Secondary | ICD-10-CM | POA: Diagnosis not present

## 2012-08-11 DIAGNOSIS — R269 Unspecified abnormalities of gait and mobility: Secondary | ICD-10-CM | POA: Diagnosis not present

## 2012-08-15 DIAGNOSIS — M25569 Pain in unspecified knee: Secondary | ICD-10-CM | POA: Diagnosis not present

## 2012-08-15 DIAGNOSIS — M6281 Muscle weakness (generalized): Secondary | ICD-10-CM | POA: Diagnosis not present

## 2012-08-15 DIAGNOSIS — M1710 Unilateral primary osteoarthritis, unspecified knee: Secondary | ICD-10-CM | POA: Diagnosis not present

## 2012-08-15 DIAGNOSIS — R269 Unspecified abnormalities of gait and mobility: Secondary | ICD-10-CM | POA: Diagnosis not present

## 2012-08-18 DIAGNOSIS — M6281 Muscle weakness (generalized): Secondary | ICD-10-CM | POA: Diagnosis not present

## 2012-08-18 DIAGNOSIS — M1710 Unilateral primary osteoarthritis, unspecified knee: Secondary | ICD-10-CM | POA: Diagnosis not present

## 2012-08-18 DIAGNOSIS — M25569 Pain in unspecified knee: Secondary | ICD-10-CM | POA: Diagnosis not present

## 2012-08-18 DIAGNOSIS — R269 Unspecified abnormalities of gait and mobility: Secondary | ICD-10-CM | POA: Diagnosis not present

## 2012-08-22 DIAGNOSIS — M25569 Pain in unspecified knee: Secondary | ICD-10-CM | POA: Diagnosis not present

## 2012-08-22 DIAGNOSIS — M6281 Muscle weakness (generalized): Secondary | ICD-10-CM | POA: Diagnosis not present

## 2012-08-22 DIAGNOSIS — M1710 Unilateral primary osteoarthritis, unspecified knee: Secondary | ICD-10-CM | POA: Diagnosis not present

## 2012-08-22 DIAGNOSIS — R269 Unspecified abnormalities of gait and mobility: Secondary | ICD-10-CM | POA: Diagnosis not present

## 2012-08-25 DIAGNOSIS — N318 Other neuromuscular dysfunction of bladder: Secondary | ICD-10-CM | POA: Diagnosis not present

## 2012-08-25 DIAGNOSIS — Z1331 Encounter for screening for depression: Secondary | ICD-10-CM | POA: Diagnosis not present

## 2012-08-25 DIAGNOSIS — R197 Diarrhea, unspecified: Secondary | ICD-10-CM | POA: Diagnosis not present

## 2012-08-25 DIAGNOSIS — I10 Essential (primary) hypertension: Secondary | ICD-10-CM | POA: Diagnosis not present

## 2012-09-01 DIAGNOSIS — IMO0002 Reserved for concepts with insufficient information to code with codable children: Secondary | ICD-10-CM | POA: Diagnosis not present

## 2012-09-01 DIAGNOSIS — M1710 Unilateral primary osteoarthritis, unspecified knee: Secondary | ICD-10-CM | POA: Diagnosis not present

## 2012-09-13 DIAGNOSIS — M79609 Pain in unspecified limb: Secondary | ICD-10-CM | POA: Diagnosis not present

## 2012-09-19 DIAGNOSIS — M79609 Pain in unspecified limb: Secondary | ICD-10-CM | POA: Diagnosis not present

## 2012-09-22 DIAGNOSIS — M79609 Pain in unspecified limb: Secondary | ICD-10-CM | POA: Diagnosis not present

## 2012-09-26 DIAGNOSIS — J45909 Unspecified asthma, uncomplicated: Secondary | ICD-10-CM | POA: Diagnosis not present

## 2012-09-26 DIAGNOSIS — R509 Fever, unspecified: Secondary | ICD-10-CM | POA: Diagnosis not present

## 2012-09-26 DIAGNOSIS — R059 Cough, unspecified: Secondary | ICD-10-CM | POA: Diagnosis not present

## 2012-09-26 DIAGNOSIS — R05 Cough: Secondary | ICD-10-CM | POA: Diagnosis not present

## 2012-10-05 DIAGNOSIS — R197 Diarrhea, unspecified: Secondary | ICD-10-CM | POA: Diagnosis not present

## 2012-10-20 ENCOUNTER — Telehealth: Payer: Self-pay | Admitting: Oncology

## 2012-10-20 NOTE — Telephone Encounter (Signed)
Gave pt appt for lab and MD on 10/24/12

## 2012-10-24 ENCOUNTER — Other Ambulatory Visit (HOSPITAL_BASED_OUTPATIENT_CLINIC_OR_DEPARTMENT_OTHER): Payer: Medicare Other | Admitting: Lab

## 2012-10-24 ENCOUNTER — Ambulatory Visit (HOSPITAL_BASED_OUTPATIENT_CLINIC_OR_DEPARTMENT_OTHER): Payer: Medicare Other | Admitting: Oncology

## 2012-10-24 ENCOUNTER — Telehealth: Payer: Self-pay | Admitting: Oncology

## 2012-10-24 VITALS — BP 138/78 | HR 78 | Temp 96.0°F | Resp 18 | Ht 66.5 in | Wt 290.2 lb

## 2012-10-24 DIAGNOSIS — C880 Waldenstrom macroglobulinemia: Secondary | ICD-10-CM

## 2012-10-24 DIAGNOSIS — M1710 Unilateral primary osteoarthritis, unspecified knee: Secondary | ICD-10-CM

## 2012-10-24 DIAGNOSIS — C8589 Other specified types of non-Hodgkin lymphoma, extranodal and solid organ sites: Secondary | ICD-10-CM

## 2012-10-24 DIAGNOSIS — G4730 Sleep apnea, unspecified: Secondary | ICD-10-CM

## 2012-10-24 LAB — CBC WITH DIFFERENTIAL/PLATELET
BASO%: 0.5 % (ref 0.0–2.0)
Basophils Absolute: 0 10*3/uL (ref 0.0–0.1)
EOS%: 2.2 % (ref 0.0–7.0)
Eosinophils Absolute: 0.1 10*3/uL (ref 0.0–0.5)
HCT: 32.2 % — ABNORMAL LOW (ref 34.8–46.6)
HGB: 11.3 g/dL — ABNORMAL LOW (ref 11.6–15.9)
LYMPH%: 35.9 % (ref 14.0–49.7)
MCH: 31.3 pg (ref 25.1–34.0)
MCHC: 35 g/dL (ref 31.5–36.0)
MCV: 89.3 fL (ref 79.5–101.0)
MONO#: 0.2 10*3/uL (ref 0.1–0.9)
MONO%: 4.1 % (ref 0.0–14.0)
NEUT#: 2.5 10*3/uL (ref 1.5–6.5)
NEUT%: 57.3 % (ref 38.4–76.8)
Platelets: 226 10*3/uL (ref 145–400)
RBC: 3.61 10*6/uL — ABNORMAL LOW (ref 3.70–5.45)
RDW: 18 % — ABNORMAL HIGH (ref 11.2–14.5)
WBC: 4.4 10*3/uL (ref 3.9–10.3)
lymph#: 1.6 10*3/uL (ref 0.9–3.3)

## 2012-10-24 LAB — COMPREHENSIVE METABOLIC PANEL (CC13)
ALT: 15 U/L (ref 0–55)
AST: 13 U/L (ref 5–34)
Albumin: 3.2 g/dL — ABNORMAL LOW (ref 3.5–5.0)
Alkaline Phosphatase: 74 U/L (ref 40–150)
BUN: 12.6 mg/dL (ref 7.0–26.0)
CO2: 26 mEq/L (ref 22–29)
Calcium: 9.7 mg/dL (ref 8.4–10.4)
Chloride: 107 mEq/L (ref 98–107)
Creatinine: 0.8 mg/dL (ref 0.6–1.1)
Glucose: 125 mg/dl — ABNORMAL HIGH (ref 70–99)
Potassium: 3.8 mEq/L (ref 3.5–5.1)
Sodium: 143 mEq/L (ref 136–145)
Total Bilirubin: 0.72 mg/dL (ref 0.20–1.20)
Total Protein: 7.8 g/dL (ref 6.4–8.3)

## 2012-10-24 NOTE — Telephone Encounter (Signed)
gv and printed app schedule for Sept

## 2012-10-24 NOTE — Progress Notes (Signed)
   [facility]    OFFICE PROGRESS NOTE   INTERVAL HISTORY:   She returns as scheduled. She reports an episode of "bronchitis "approximately 2 weeks ago treated by Dr. Pete Glatter. Mild exertional dyspnea and malaise. No fever, night sweats, or palpable lymph nodes. The left knee arthritis has progressed.  Objective:  Vital signs in last 24 hours:  Blood pressure 138/78, pulse 78, temperature 96 F (35.6 C), temperature source Oral, resp. rate 18, height 5' 6.5" (1.689 m), weight 290 lb 3.2 oz (131.634 kg).    HEENT: Neck without mass, oropharynx without visible mass, slight prominence of the left compared to the right submandibular gland Lymphatics: No cervical, supraclavicular, axillary, or inguinal node Resp: End inspiratory coarse rhonchi at the left posterior base, no respiratory distress Cardio: Regular rate and rhythm GI: No hepatosplenomegaly Vascular: No leg edema   Lab Results:  Lab Results  Component Value Date   WBC 4.4 10/24/2012   HGB 11.3* 10/24/2012   HCT 32.2* 10/24/2012   MCV 89.3 10/24/2012   PLT 226 10/24/2012   ANC 2.5    Medications: I have reviewed the patient's current medications.  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 -Treated with multiple systemic therapies, most recently pentostatin/Cytoxan/rituximab in 2010   2. Left knee arthritis-now followed by Dr.Aluisio   3. Sleep apnea   Disposition:  There is no clinical evidence for progression of the non-Hodgkin's lymphoma. The hemoglobin is stable. We will followup on the serum [name] level and serum viscosity from today. She will return for an office and lab visit in 6 months. Ms. Brasington will contact us in the interim for new symptoms.   Thornton Papas, MD  10/24/2012  2:26  PM

## 2012-10-26 LAB — VISCOSITY, SERUM: Viscosity, Serum: 2.1 rel to H2O — ABNORMAL HIGH (ref 1.5–1.9)

## 2012-10-26 LAB — IGM: IgM, Serum: 2940 mg/dL — ABNORMAL HIGH (ref 52–322)

## 2012-10-31 ENCOUNTER — Telehealth: Payer: Self-pay | Admitting: *Deleted

## 2012-10-31 NOTE — Telephone Encounter (Signed)
Pt's husband notified of results and confirmed f/u appt 04/27/13.  Stated he would inform wife of results.

## 2012-10-31 NOTE — Telephone Encounter (Signed)
Message copied by Gala Romney on Mon Oct 31, 2012  4:06 PM ------      Message from: Ladene Artist      Created: Fri Oct 28, 2012  6:23 PM       Please call patient [name] and viscosity are stable, f/u as scheduled ------

## 2012-11-03 DIAGNOSIS — IMO0002 Reserved for concepts with insufficient information to code with codable children: Secondary | ICD-10-CM | POA: Diagnosis not present

## 2012-11-03 DIAGNOSIS — M1710 Unilateral primary osteoarthritis, unspecified knee: Secondary | ICD-10-CM | POA: Diagnosis not present

## 2012-11-29 DIAGNOSIS — D539 Nutritional anemia, unspecified: Secondary | ICD-10-CM | POA: Diagnosis not present

## 2012-11-29 DIAGNOSIS — R059 Cough, unspecified: Secondary | ICD-10-CM | POA: Diagnosis not present

## 2012-11-29 DIAGNOSIS — R5383 Other fatigue: Secondary | ICD-10-CM | POA: Diagnosis not present

## 2012-11-29 DIAGNOSIS — R5381 Other malaise: Secondary | ICD-10-CM | POA: Diagnosis not present

## 2012-11-29 DIAGNOSIS — Z79899 Other long term (current) drug therapy: Secondary | ICD-10-CM | POA: Diagnosis not present

## 2012-11-29 DIAGNOSIS — R05 Cough: Secondary | ICD-10-CM | POA: Diagnosis not present

## 2012-12-12 DIAGNOSIS — H2510 Age-related nuclear cataract, unspecified eye: Secondary | ICD-10-CM | POA: Diagnosis not present

## 2012-12-12 DIAGNOSIS — H5200 Hypermetropia, unspecified eye: Secondary | ICD-10-CM | POA: Diagnosis not present

## 2012-12-12 DIAGNOSIS — H33059 Total retinal detachment, unspecified eye: Secondary | ICD-10-CM | POA: Diagnosis not present

## 2012-12-14 ENCOUNTER — Telehealth: Payer: Self-pay | Admitting: Internal Medicine

## 2012-12-14 ENCOUNTER — Ambulatory Visit (INDEPENDENT_AMBULATORY_CARE_PROVIDER_SITE_OTHER): Payer: Medicare Other | Admitting: Internal Medicine

## 2012-12-14 ENCOUNTER — Encounter: Payer: Self-pay | Admitting: *Deleted

## 2012-12-14 VITALS — BP 140/70 | HR 78 | Ht 66.0 in | Wt 285.0 lb

## 2012-12-14 DIAGNOSIS — R059 Cough, unspecified: Secondary | ICD-10-CM | POA: Diagnosis not present

## 2012-12-14 DIAGNOSIS — R05 Cough: Secondary | ICD-10-CM | POA: Diagnosis not present

## 2012-12-14 DIAGNOSIS — R053 Chronic cough: Secondary | ICD-10-CM

## 2012-12-14 NOTE — Patient Instructions (Addendum)
Sign release to get records from [facility]  For office notes, pft Have full PFT breathing test I will talk to Dr Truett Perna about getting just chest ct or chest ct with abdominal CT followup after tests

## 2012-12-14 NOTE — Telephone Encounter (Signed)
Hi Brad  I am doing CT chest for cough in this patient Do you want CT abdomen asl well?  Thanks  Dr. Kalman Shan, M.D., [facility].C.P Pulmonary and Critical Care Medicine Staff Physician [place] [place] [facility]  Pulmonary and Critical Care Pager: [phone], If no answer or between  15:00h - 7:00h: call [phone]  12/14/2012 11:44 AM

## 2012-12-14 NOTE — Progress Notes (Signed)
Subjective:    Patient ID: Natasha Ellis, female    DOB: 1930-12-24, 79 y.o.   MRN: [id number]  PCP Ginette Otto, MD  HPI  Body mass index is 46.02 kg/(m^2).  reports that she quit smoking about 33 years ago. Her smoking use included Cigarettes. She has a 25 pack-year smoking history. She has never used smokeless tobacco.  IOV 12/14/2012  79 year old female.    CHronic cough x 11 years. Insidous onset. COugh worsenend in spring and fall. Quality - has sputum that is variable color yellow and white. Cough is mild but sometimes severe. When bad she is throwing up and gasping for breath. STable course. No associated tickle, gag, clearing of throat. THere is associatd post nasal drip but no acid reflux   Also, dyspnea. Insidious onet. Few to several months. Noticed it after she quit oxgen in sept 2013 when she moved from [place] to [place]. Was using only nocutrnal oxygen.        Cough relevnt hx  #Sinus/ENT/Allergy hx  - reports chronic post nasal drip - has seen ENT for lymphoma in neck  Many years ago but not for cough - denies hx of pollent allergies with sneezin but says she coughs inn pollen time - takes nasal steroids prn  #GI/GERD  - denies GERD  #Misc - Low grage B cell diagnosed 2004. S/p chemo last 3 years ago at [place]. As of 2014: in [place]   #Pulmonary hx  - Pulmonary nodules: IN 2008 and 2009 and 2011 - small stable nodules per CT report [facility]  - OSA - diagnosed sleep apnea . Using CPAP. But got rid of overnight oxygen in sept and an 2013 when she moved to [place] due to logistics   - hx of PFTs n [place]; does not know details  - OF note, she is on symbicort for a diagnosis of "chronic bronchitis"  #Exposure  Smoing:  reports that she quit smoking about 33 years ago. Her smoking use included Cigarettes. She has a 25 pack-year smoking history. She has never used smokeless tobacco.   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 Moved [place] in  Sept 2013   Dr Gretta Cool Reflux Symptom Index (> 13-15 suggestive of LPR cough) 0 -> 5  =  none ->severe problem 12/14/2012   Hoarseness or problem with voice 2  Clearing  Of Throat 2  Excess throat mucus or feeling of post nasal drip 4  Difficulty swallowing food, liquid or tablets 0  Cough after eating or lying down 0  Breathing difficulties or choking episodes 1  Troublesome or annoying cough 4  Sensation of something sticking in throat or lump in throat 0  Heartburn, chest pain, indigestion, or stomach acid coming up 0  TOTAL 13     Past Medical History  Diagnosis Date  . Malignant lymphoplasmacytic lymphoma 2004  . Hypertension   . Arthritis   . Hypercholesteremia   . IBS (irritable bowel syndrome)   . Diverticulosis of intestine     H/O  . DJD (degenerative joint disease) of lumbar spine   . Left knee DJD   . Right knee DJD   . Chronic cough      No family history on file.   History   Social History  . Marital Status: Married    Spouse Name: N/A    Number of Children: N/A  . Years of Education: N/A   Occupational History  . Not on file.  Social History Main Topics  . Smoking status: Former Smoker -- 1.00 packs/day for 25 years    Types: Cigarettes    Quit date: 08/04/1979  . Smokeless tobacco: Never Used  . Alcohol Use: Not on file  . Drug Use: Not on file  . Sexually Active: Not on file   Other Topics Concern  . Not on file   Social History Narrative  . No narrative on file     Allergies  Allergen Reactions  . Codeine Other (See Comments)    Crazy-nightmares  . Erythromycin Other (See Comments)    GI upset  . Lisinopril Cough  . Oxybutynin   . Sulfa Antibiotics Other (See Comments)    GI upset     Outpatient Prescriptions Prior to Visit  Medication Sig Dispense Refill  . aspirin 81 MG tablet Take 81 mg by mouth daily.      . budesonide-formoterol (SYMBICORT) 80-4.5 MCG/ACT inhaler Inhale 2 puffs into the lungs 2 (two) times daily.       . cholestyramine (QUESTRAN) 4 GM/DOSE powder Take 4 g by mouth daily.      . cyanocobalamin 1000 MCG tablet Take 100 mcg by mouth daily.      . fluticasone (FLONASE) 50 MCG/ACT nasal spray Place 1 spray into the nose 2 (two) times daily. Each nostril      . hydrochlorothiazide (HYDRODIURIL) 25 MG tablet Take 25 mg by mouth daily.      . Multiple Vitamin (MULTIVITAMIN) capsule Take 1 capsule by mouth daily. With Vitamin D 4000      . potassium chloride (K-DUR) 10 MEQ tablet Take 10 mEq by mouth daily.      . solifenacin (VESICARE) 5 MG tablet Take 5 mg by mouth daily.       No facility-administered medications prior to visit.      Review of Systems  Constitutional: Negative for fever and unexpected weight change.  HENT: Positive for dental problem. Negative for ear pain, nosebleeds, congestion, sore throat, rhinorrhea, sneezing, trouble swallowing, postnasal drip and sinus pressure.   Eyes: Negative for redness and itching.  Respiratory: Positive for cough and shortness of breath. Negative for chest tightness and wheezing.   Cardiovascular: Negative for palpitations and leg swelling.  Gastrointestinal: Negative for nausea and vomiting.  Genitourinary: Negative for dysuria.  Musculoskeletal: Positive for myalgias, joint swelling and arthralgias.  Skin: Negative for rash.  Neurological: Negative for headaches.  Hematological: Does not bruise/bleed easily.  Psychiatric/Behavioral: Negative for dysphoric mood. The patient is not nervous/anxious.        Objective:   Physical Exam  Vitals reviewed. Constitutional: She is oriented to person, place, and time. She appears well-developed and well-nourished. No distress.  Body mass index is 46.02 kg/(m^2).   HENT:  Head: Normocephalic and atraumatic.  Right Ear: External ear normal.  Left Ear: External ear normal.  Mouth/Throat: Oropharynx is clear and moist. No oropharyngeal exudate.  Eyes: Conjunctivae and EOM are normal. Pupils are  equal, round, and reactive to light. Right eye exhibits no discharge. Left eye exhibits no discharge. No scleral icterus.  Neck: Normal range of motion. Neck supple. No JVD present. No tracheal deviation present. No thyromegaly present.  Cardiovascular: Normal rate, regular rhythm, normal heart sounds and intact distal pulses.  Exam reveals no gallop and no friction rub.   No murmur heard. Pulmonary/Chest: Effort normal and breath sounds normal. No respiratory distress. She has no wheezes. She has no rales. She exhibits no tenderness.  Abdominal: Soft.  Bowel sounds are normal. She exhibits no distension and no mass. There is no tenderness. There is no rebound and no guarding.  Musculoskeletal: Normal range of motion. She exhibits no edema and no tenderness.  Lymphadenopathy:    She has no cervical adenopathy.  Neurological: She is alert and oriented to person, place, and time. She has normal reflexes. No cranial nerve deficit. She exhibits normal muscle tone. Coordination normal.  Skin: Skin is warm and dry. No rash noted. She is not diaphoretic. No erythema. No pallor.  Psychiatric: She has a normal mood and affect. Her behavior is normal. Judgment and thought content normal.          Assessment & Plan:

## 2012-12-18 NOTE — Telephone Encounter (Signed)
No. Not unless she has GI symptoms.  Thanks,  [employer]

## 2012-12-19 ENCOUNTER — Ambulatory Visit (INDEPENDENT_AMBULATORY_CARE_PROVIDER_SITE_OTHER): Payer: Medicare Other | Admitting: Internal Medicine

## 2012-12-19 DIAGNOSIS — R0602 Shortness of breath: Secondary | ICD-10-CM | POA: Diagnosis not present

## 2012-12-19 LAB — PULMONARY FUNCTION TEST
DL/VA % pred: 64 %
DL/VA: 3.23 ml/min/mmHg/L
DLCO unc % pred: 56 %
DLCO unc: 15.29 ml/min/mmHg
FEF 25-75 Post: 1.1 L/sec
FEF 25-75 Pre: 1.12 L/sec
FEF2575-%Change-Post: -1 %
FEF2575-%Pred-Post: 71 %
FEF2575-%Pred-Pre: 72 %
FEV1-%Change-Post: 0 %
FEV1-%Pred-Post: 84 %
FEV1-%Pred-Pre: 84 %
FEV1-Post: 1.8 L
FEV1-Pre: 1.81 L
FEV1FVC-%Change-Post: 3 %
FEV1FVC-%Pred-Pre: 96 %
FEV6-%Change-Post: -3 %
FEV6-%Pred-Post: 89 %
FEV6-%Pred-Pre: 91 %
FEV6-Post: 2.43 L
FEV6-Pre: 2.5 L
FEV6FVC-%Change-Post: 1 %
FEV6FVC-%Pred-Post: 104 %
FEV6FVC-%Pred-Pre: 103 %
FVC-%Change-Post: -4 %
FVC-%Pred-Post: 84 %
FVC-%Pred-Pre: 88 %
FVC-Post: 2.43 L
Post FEV1/FVC ratio: 74 %
Post FEV6/FVC ratio: 100 %
Pre FEV1/FVC ratio: 71 %
Pre FEV6/FVC Ratio: 99 %
RV % pred: 102 %
RV: 2.54 L
TLC % pred: 89 %
TLC: 4.8 L

## 2012-12-19 NOTE — Progress Notes (Signed)
PFT done today. 

## 2012-12-19 NOTE — Telephone Encounter (Signed)
She does not have gi symptioms but given her hx of lymphoma she was wondering if you would want ct abdomen for lymphoma. If not, I will just get a chest ct  Thanks  Dr. Kalman Shan, M.D., [facility].C.P Pulmonary and Critical Care Medicine Staff Physician [place] [place] Pulmonary and Critical Care Pager: [phone], If no answer or between  15:00h - 7:00h: call [phone]  12/19/2012 3:42 PM

## 2012-12-22 ENCOUNTER — Other Ambulatory Visit: Payer: Self-pay | Admitting: Geriatric Medicine

## 2012-12-22 DIAGNOSIS — Z0000 Encounter for general adult medical examination without abnormal findings: Secondary | ICD-10-CM | POA: Diagnosis not present

## 2012-12-22 DIAGNOSIS — D649 Anemia, unspecified: Secondary | ICD-10-CM | POA: Diagnosis not present

## 2012-12-22 DIAGNOSIS — Z1331 Encounter for screening for depression: Secondary | ICD-10-CM | POA: Diagnosis not present

## 2012-12-26 ENCOUNTER — Telehealth: Payer: Self-pay | Admitting: Internal Medicine

## 2012-12-26 DIAGNOSIS — R053 Chronic cough: Secondary | ICD-10-CM

## 2012-12-26 DIAGNOSIS — R942 Abnormal results of pulmonary function studies: Secondary | ICD-10-CM

## 2012-12-26 DIAGNOSIS — R05 Cough: Secondary | ICD-10-CM

## 2012-12-26 NOTE — Telephone Encounter (Signed)
pft 12/19/12 shows isolated reduced diffusion which is difficult in o2 transfer across lung to blood. Unclear why. Best to sort out with ct chest.  Let her know. Ordered ct  Also, I d/w Dr Truett Perna her oncologist who feels CT abdomen needed only if abdominal symptoms   Dr. Kalman Shan, M.D., [facility].C.P Pulmonary and Critical Care Medicine Staff Physician [facility] [place] Pulmonary and Critical Care Pager: [phone], If no answer or between  15:00h - 7:00h: call [phone]  12/26/2012 11:40 AM

## 2012-12-28 DIAGNOSIS — Z1211 Encounter for screening for malignant neoplasm of colon: Secondary | ICD-10-CM | POA: Diagnosis not present

## 2012-12-29 ENCOUNTER — Other Ambulatory Visit: Payer: Medicare Other

## 2012-12-30 ENCOUNTER — Ambulatory Visit (INDEPENDENT_AMBULATORY_CARE_PROVIDER_SITE_OTHER)
Admission: RE | Admit: 2012-12-30 | Discharge: 2012-12-30 | Disposition: A | Discharge status: Home / Self Care | Payer: Medicare Other | Source: Ambulatory Visit | Attending: Internal Medicine | Admitting: Internal Medicine

## 2012-12-30 DIAGNOSIS — R059 Cough, unspecified: Secondary | ICD-10-CM

## 2012-12-30 DIAGNOSIS — R942 Abnormal results of pulmonary function studies: Secondary | ICD-10-CM

## 2012-12-30 DIAGNOSIS — R05 Cough: Secondary | ICD-10-CM | POA: Diagnosis not present

## 2012-12-30 DIAGNOSIS — R053 Chronic cough: Secondary | ICD-10-CM

## 2013-01-01 ENCOUNTER — Telehealth: Payer: Self-pay | Admitting: Internal Medicine

## 2013-01-01 DIAGNOSIS — I2510 Atherosclerotic heart disease of native coronary artery without angina pectoris: Secondary | ICD-10-CM

## 2013-01-01 DIAGNOSIS — R05 Cough: Secondary | ICD-10-CM | POA: Insufficient documentation

## 2013-01-01 DIAGNOSIS — R053 Chronic cough: Secondary | ICD-10-CM | POA: Insufficient documentation

## 2013-01-01 DIAGNOSIS — I2584 Coronary atherosclerosis due to calcified coronary lesion: Secondary | ICD-10-CM

## 2013-01-01 NOTE — Telephone Encounter (Signed)
I reviewed CT scan of the chest 12/30/2012. Several abnormalities   1. she has evidence of some chronic inflammation in the lungs. I really need CD-ROM of CT scan from [place] and pulmonary notes from [place]. Please have the patient get them to me.   2. she also has calcium deposits in the blood vessels of the heart. So please refer her to [facility] cardiology. I have done an order  3. give followup to see me after pulmonary function test that I ordered at last visit  4. At time of followup I do need the [facility] records    Dr. Kalman Shan, M.D., [facility].C.P Pulmonary and Critical Care Medicine Staff Physician [place] [facility] [place] Pulmonary and Critical Care Pager: [phone], If no answer or between  15:00h - 7:00h: call [phone]  01/01/2013 9:38 PM

## 2013-01-01 NOTE — Assessment & Plan Note (Signed)
Sign release to get records from [facility]  For office notes, pft Have full PFT breathing test I will talk to Dr Sherrill about getting just chest ct or chest ct with abdominal CT followup after tests 

## 2013-01-03 DIAGNOSIS — I10 Essential (primary) hypertension: Secondary | ICD-10-CM | POA: Diagnosis not present

## 2013-01-03 DIAGNOSIS — R0989 Other specified symptoms and signs involving the circulatory and respiratory systems: Secondary | ICD-10-CM | POA: Diagnosis not present

## 2013-01-03 DIAGNOSIS — R0609 Other forms of dyspnea: Secondary | ICD-10-CM | POA: Diagnosis not present

## 2013-01-03 DIAGNOSIS — I2510 Atherosclerotic heart disease of native coronary artery without angina pectoris: Secondary | ICD-10-CM | POA: Diagnosis not present

## 2013-01-03 NOTE — Telephone Encounter (Signed)
[email or web address].Abdulkadir Emmanuel, CMA  

## 2013-01-04 NOTE — Telephone Encounter (Signed)
Spoke with pt and notified of recs per MR She verbalized understanding  She has already had PFT and I have set her up for f/u with MR for 02/02/13 at 3:45 At that time she can sign a release so we can obtain her records from [facility]

## 2013-01-04 NOTE — Telephone Encounter (Signed)
Patient is returning Jennifer's call. [phone]

## 2013-01-04 NOTE — Telephone Encounter (Signed)
Pt called back again and asks that she be called back before 4:30 today as she lives in a retirement home and will be going to dinner by then. Natasha Ellis

## 2013-01-05 DIAGNOSIS — H2589 Other age-related cataract: Secondary | ICD-10-CM | POA: Diagnosis not present

## 2013-01-05 DIAGNOSIS — H2510 Age-related nuclear cataract, unspecified eye: Secondary | ICD-10-CM | POA: Diagnosis not present

## 2013-01-09 ENCOUNTER — Encounter: Payer: Self-pay | Admitting: Internal Medicine

## 2013-01-11 DIAGNOSIS — H9209 Otalgia, unspecified ear: Secondary | ICD-10-CM | POA: Diagnosis not present

## 2013-01-11 DIAGNOSIS — R0989 Other specified symptoms and signs involving the circulatory and respiratory systems: Secondary | ICD-10-CM | POA: Diagnosis not present

## 2013-01-11 DIAGNOSIS — K219 Gastro-esophageal reflux disease without esophagitis: Secondary | ICD-10-CM | POA: Diagnosis not present

## 2013-01-11 DIAGNOSIS — R0609 Other forms of dyspnea: Secondary | ICD-10-CM | POA: Diagnosis not present

## 2013-01-11 DIAGNOSIS — I2510 Atherosclerotic heart disease of native coronary artery without angina pectoris: Secondary | ICD-10-CM | POA: Diagnosis not present

## 2013-01-11 DIAGNOSIS — I10 Essential (primary) hypertension: Secondary | ICD-10-CM | POA: Diagnosis not present

## 2013-01-13 NOTE — Telephone Encounter (Signed)
Why are you sendinng this back to me ? I sent you phone note on 01/01/13 on which you acted and gave patient Ct results. If this was a computer kick back to me you can sign and close it off  Dr. Kalman Shan, M.D., [facility].C.P Pulmonary and Critical Care Medicine Staff Physician [place] [facility] [place] Pulmonary and Critical Care Pager: [phone], If no answer or between  15:00h - 7:00h: call [phone]  01/13/2013 4:00 PM

## 2013-01-13 NOTE — Telephone Encounter (Signed)
Pt advised of PFT results. Please advise of CT results. Carron Curie, CMA

## 2013-01-13 NOTE — Telephone Encounter (Signed)
LMTCBx2. Pt had CT done on 12-30-12, but still need to advise of PFT results I will send message to MR to get CT results in the meantime Please advise on CT results. Carron Curie, CMA

## 2013-01-16 ENCOUNTER — Telehealth: Payer: Self-pay | Admitting: Internal Medicine

## 2013-01-16 ENCOUNTER — Ambulatory Visit (INDEPENDENT_AMBULATORY_CARE_PROVIDER_SITE_OTHER): Payer: Medicare Other | Admitting: Internal Medicine

## 2013-01-16 ENCOUNTER — Encounter: Payer: Self-pay | Admitting: Internal Medicine

## 2013-01-16 VITALS — BP 130/78 | HR 87 | Temp 98.1°F | Ht 66.0 in | Wt 283.2 lb

## 2013-01-16 DIAGNOSIS — R05 Cough: Secondary | ICD-10-CM

## 2013-01-16 DIAGNOSIS — R0989 Other specified symptoms and signs involving the circulatory and respiratory systems: Secondary | ICD-10-CM

## 2013-01-16 DIAGNOSIS — R0609 Other forms of dyspnea: Secondary | ICD-10-CM

## 2013-01-16 DIAGNOSIS — R059 Cough, unspecified: Secondary | ICD-10-CM | POA: Diagnosis not present

## 2013-01-16 DIAGNOSIS — J849 Interstitial pulmonary disease, unspecified: Secondary | ICD-10-CM

## 2013-01-16 DIAGNOSIS — R0600 Dyspnea, unspecified: Secondary | ICD-10-CM

## 2013-01-16 DIAGNOSIS — R053 Chronic cough: Secondary | ICD-10-CM

## 2013-01-16 NOTE — Patient Instructions (Addendum)
#  shortness of breath  - will test for oxygen levels at night on room air while using CPAP  #cough  - I will wait to hear from Dr Natasha Ellis  #Followup - keep 02/02/13 visit  - wait to hear more from me

## 2013-01-16 NOTE — Progress Notes (Signed)
Subjective:    Patient ID: Natasha Ellis, female    DOB: Sep 14, 1930, 79 y.o.   MRN: [id number] PCP Ginette Otto, MD  HPI Body mass index is 46.02 kg/(m^2).  reports that she quit smoking about 33 years ago. Her smoking use included Cigarettes. She has a 25 pack-year smoking history. She has never used smokeless tobacco.  IOV 12/14/2012  79 year old female.    CHronic cough x 11 years. Insidous onset. COugh worsenend in spring and fall. Quality - has sputum that is variable color yellow and white. Cough is mild but sometimes severe. When bad she is throwing up and gasping for breath. STable course. No associated tickle, gag, clearing of throat. THere is associatd post nasal drip but no acid reflux   Also, dyspnea. Insidious onet. Few to several months. Noticed it after she quit oxgen in sept 2013 when she moved from [place]. Was using only nocutrnal oxygen.     Cough relevnt hx  #Sinus/ENT/Allergy hx  - reports chronic post nasal drip - has seen ENT for lymphoma in neck  Many years ago but not for cough - denies hx of pollent allergies with sneezin but says she coughs inn pollen time - takes nasal steroids prn  #GI/GERD  - denies GERD - vehmently denies aspiration  #Misc - Low grage B cell diagnosed 2004. S/p chemo last 3 years ago at [place]. As of 2014: in [place]   #Pulmonary hx  - Pulmonary nodules: IN 2008 and 2009 and 2011 - small stable nodules per CT report at [facility]  - OSA - diagnosed sleep apnea . Using CPAP. But got rid of overnight oxygen in sept and an 2013 when she moved to [place] due to logistics   - hx of PFTs [place] [facility]; does not know details  - OF note, she is on symbicort for a diagnosis of "chronic bronchitis"  #Exposure  Smoing:  reports that she quit smoking about 33 years ago. Her smoking use included Cigarettes. She has a 25 pack-year smoking history. She has never used smokeless tobacco.   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 [place]oved  [place] in Sept 2013   Dr Gretta Cool Reflux Symptom Index (> 13-15 suggestive of LPR cough) 0 -> 5  =  none ->severe problem 12/14/2012   Hoarseness or problem with voice 2  Clearing  Of Throat 2  Excess throat mucus or feeling of post nasal drip 4  Difficulty swallowing food, liquid or tablets 0  Cough after eating or lying down 0  Breathing difficulties or choking episodes 1  Troublesome or annoying cough 4  Sensation of something sticking in throat or lump in throat 0  Heartburn, chest pain, indigestion, or stomach acid coming up 0  TOTAL 13    REC Sign release to get records from [facility]  For office notes, pft Have full PFT breathing test I will talk to Dr Truett Perna about getting just chest ct or chest ct with abdominal CT followup after tests    PFT 12/19/12  - Normal spiro, lung function but DLCO 56%. Will get CT chest   Telephoine call EndMay/Early June 2014:   reviewed CT scan of the chest 12/30/2012. Several abnormalities   1. she has evidence of some chronic inflammation in the lungs. I really need CD-ROM of CT scan from [place] and pulmonary notes from [place]. Please have the patient get them to me.   2. she also has calcium deposits in the blood  vessels of the heart. So please refer her to cardiology. I have done an order  3. At time of followup I do need the [facility] records     OV 01/16/2013  Acute vsiit.   - Says dyspnea getting worse. Dyspnea present for years (last vist she told me months). Was stable. ASt last OV cough was dominant symptoms but last few weeks dyspnea worse and has become dominant symptoms. Now class 2-3 exertiona. RElieved with ret. No orthopnea and  No pnd. REpotedly had cardiac stress test with Dr Mendel Ryder and normal (last few weeks). No recent fever  but did have these in April 2014.  Of note, in feb 2014 she gave up PT at the [facility] home due to lack of energy. Dyspnea is associated with fatigue. In terms of  fatigue: hgb 11gm% in March 2014 and started on Iron. She is on Vit D supplementation and she reports normal thyroid function  In terms of cough: still the same. She does have periodic yellow sputum just as before. Cough x 11 years. She is hesitant to have bronch because of pripor experience when she got MRSA infection in2009 after bronch per report. Denies aspiration  Walk test Walking desaturation test on 01/16/2013 185 feet x 3 laps:  Did NOT desaturate. Able to barely do Only 1 LAP and then stopped due to dyspnea.  Rest pulse ox was 97%, final pulse ox was 95%. HR response 91/min at rest to 106/min at peak exertion.  I spoke to her on 01/25/2013 after getting info from DR Jacquelyne Balint at [facility]: patient he says had normal scan last few years. Patient now continues to have dyspnea though handled cataract surgery well few weeks ago and is scheduled for another one tomorrow [phone]    Review of Systems  Constitutional: Negative for fever and unexpected weight change.  HENT: Positive for postnasal drip. Negative for ear pain, nosebleeds, congestion, sore throat, rhinorrhea, sneezing, trouble swallowing, dental problem and sinus pressure.   Eyes: Negative for redness and itching.  Respiratory: Positive for cough, shortness of breath and wheezing. Negative for chest tightness.   Cardiovascular: Positive for leg swelling. Negative for palpitations.  Gastrointestinal: Negative for nausea and vomiting.  Genitourinary: Negative for dysuria.  Musculoskeletal: Negative for joint swelling.  Skin: Negative for rash.  Neurological: Positive for headaches.  Hematological: Does not bruise/bleed easily.  Psychiatric/Behavioral: Negative for dysphoric mood. The patient is not nervous/anxious.        Objective:   Physical Exam  Physical Exam  Vitals reviewed. Constitutional: She is oriented to person, place, and time. She appears well-developed and well-nourished. No distress.  Body mass index is 46.02  kg/(m^2).   HENT:  Head: Normocephalic and atraumatic.  Right Ear: External ear normal.  Left Ear: External ear normal.  Mouth/Throat: Oropharynx is clear and moist. No oropharyngeal exudate.  Eyes: Conjunctivae and EOM are normal. Pupils are equal, round, and reactive to light. Right eye exhibits no discharge. Left eye exhibits no discharge. No scleral icterus.  Neck: Normal range of motion. Neck supple. No JVD present. No tracheal deviation present. No thyromegaly present.  Cardiovascular: Normal rate, regular rhythm, normal heart sounds and intact distal pulses.  Exam reveals no gallop and no friction rub.   No murmur heard. Pulmonary/Chest: Effort normal and breath sounds normal. No respiratory distress. She has no wheezes. She has no rales. She exhibits no tenderness.  Abdominal: Soft. Bowel sounds are normal. She exhibits no distension and no mass. There is no  tenderness. There is no rebound and no guarding.  Musculoskeletal: Normal range of motion. She exhibits no edema and no tenderness.  Lymphadenopathy:    She has no cervical adenopathy.  Neurological: She is alert and oriented to person, place, and time. She has normal reflexes. No cranial nerve deficit. She exhibits normal muscle tone. Coordination normal.  Skin: Skin is warm and dry. No rash noted. She is not diaphoretic. No erythema. No pallor.  Psychiatric: She has a normal mood and affect. Her behavior is normal. Judgment and thought content normal.             Assessment & Plan:

## 2013-01-16 NOTE — Telephone Encounter (Signed)
Spoke with pt. appt scheduled at 4. Nothing further was needed

## 2013-01-16 NOTE — Telephone Encounter (Signed)
Is she willing to see Lucius Conn another MD this week ? I would love to see her but schedule is overbooked already.  Might open up for Friday but not sure right now. My preference is she be seen by another MD tthis week. I think is important she be seen this week next 1-2 days however  Dr. Kalman Shan, M.D., [facility].C.P Pulmonary and Critical Care Medicine Staff Physician [facility] Pulmonary and Critical Care Pager: [phone], If no answer or between  15:00h - 7:00h: call [phone]  01/16/2013 11:54 AM

## 2013-01-16 NOTE — Telephone Encounter (Signed)
Talk to Philipp Deputy and see if you can overbook her towards end of day. No double booking before 3pm    Dr. Kalman Shan, M.D., [facility].C.P Pulmonary and Critical Care Medicine Staff Physician [facility]monary and Critical Care Pager: [phone], If no answer or between  15:00h - 7:00h: call [phone]  01/16/2013 12:33 PM

## 2013-01-16 NOTE — Telephone Encounter (Signed)
Patient has been scheduled with TP 01/18/13 at 10a--asks to check with MR one more time... Patient stressed that she is very uneasy about seeing TP and would like MR to really consider seeing her today if at all possible. I have explained to her that she MR is booked up but she is requesting to be seen by her regular physician that knows her and not TP. Pt states that if she has to be seen by TP then she will do that but wanted MR to know that she would much rather see him.   Please advise if there is anything you can work out. Thanks.

## 2013-01-16 NOTE — Telephone Encounter (Signed)
Pt c/o increased sob with very little exertion. Denies all other symptoms. Pt reports that Albuterol is not working at all to help give relief to symptoms.  Patient is wanting to be seen either today or at some point this week--explained to pt that MR is booked up today but that she could see another physician in our practice.  Upcoming appt with MR 02/02/13 at 345p but patient does not feel she can wait that long. Patient would like to know Dr Deeann Cree recs or see him today it AT ALL POSSIBLE.  Please advise Dr Marchelle Gearing. Thanks.

## 2013-01-18 ENCOUNTER — Ambulatory Visit: Payer: Medicare Other | Admitting: Adult Health

## 2013-01-23 ENCOUNTER — Telehealth: Payer: Self-pay | Admitting: Internal Medicine

## 2013-01-23 ENCOUNTER — Encounter: Payer: Self-pay | Admitting: Internal Medicine

## 2013-01-23 ENCOUNTER — Other Ambulatory Visit: Payer: Self-pay

## 2013-01-23 DIAGNOSIS — Z1231 Encounter for screening mammogram for malignant neoplasm of breast: Secondary | ICD-10-CM

## 2013-01-23 NOTE — Telephone Encounter (Signed)
Called, spoke with pt. Order was placed for ONO on RA with cpap on 01/16/13. Pt hasn't heard anything yet about having this test done. I have called Melissa with [facility] to check on this.

## 2013-01-24 NOTE — Telephone Encounter (Signed)
Pt is calling back again from  [phone]

## 2013-01-24 NOTE — Telephone Encounter (Signed)
Natasha Ellis is checking on this and will call back

## 2013-01-24 NOTE — Telephone Encounter (Signed)
i called Natasha Ellis and she stated she will have tc right back. Will await call back

## 2013-01-25 ENCOUNTER — Telehealth: Payer: Self-pay | Admitting: Internal Medicine

## 2013-01-25 ENCOUNTER — Other Ambulatory Visit: Payer: Self-pay | Admitting: *Deleted

## 2013-01-25 DIAGNOSIS — R05 Cough: Secondary | ICD-10-CM

## 2013-01-25 DIAGNOSIS — R059 Cough, unspecified: Secondary | ICD-10-CM

## 2013-01-25 DIAGNOSIS — R0600 Dyspnea, unspecified: Secondary | ICD-10-CM | POA: Insufficient documentation

## 2013-01-25 MED ORDER — LEVOFLOXACIN 500 MG PO TABS: 500.0000 mg | ORAL_TABLET | Freq: Every day | ORAL | Status: DC

## 2013-01-25 NOTE — Assessment & Plan Note (Signed)
Suspect cough is due to pulmonary infiltrates. She is denying aspiration or infectious symptoms so I really do not know what these infitlrates are. I gather ct few years ago at [facility] was normal. So this infiltrate is new relatively. I d/w her on 01/25/2013 and agreed on empiric levaquin and autoimmune workup and rOv

## 2013-01-25 NOTE — Assessment & Plan Note (Signed)
Probably multifactorial from obesity, pulmnar infiltrate, and deconditioning. Will see how this improves with Rx plan in cough section

## 2013-01-25 NOTE — Telephone Encounter (Signed)
Order received and signed by MR and was faxed back to [facility]

## 2013-01-25 NOTE — Telephone Encounter (Signed)
Natasha Ellis states that the order for ONO was received, but they require MR to sign another form before they will do the test Will await the fax and then give to JC to have MR sign

## 2013-01-25 NOTE — Telephone Encounter (Signed)
Spoke with pt and notified of recs per MR She verbalized understanding Rx was sent to pharm and pt to come in this wk for labs

## 2013-01-25 NOTE — Telephone Encounter (Signed)
Sending to triage but you can send this to [facility] for actioin today. Spoke tp patient  Send in    take levaquin 500mg  once daily  X 5 days  I have ordered auutoimmune lab work, please have her do that   Dr. Kalman Shan, M.D., [facility].C.P Pulmonary and Critical Care Medicine Staff Physician [facility] Pulmonary and Critical Care Pager: [phone], If no answer or between  15:00h - 7:00h: call [phone]  01/25/2013 10:26 AM

## 2013-01-26 DIAGNOSIS — H2589 Other age-related cataract: Secondary | ICD-10-CM | POA: Diagnosis not present

## 2013-01-26 DIAGNOSIS — H2510 Age-related nuclear cataract, unspecified eye: Secondary | ICD-10-CM | POA: Diagnosis not present

## 2013-01-30 ENCOUNTER — Ambulatory Visit: Payer: Medicare Other

## 2013-01-30 DIAGNOSIS — R0989 Other specified symptoms and signs involving the circulatory and respiratory systems: Secondary | ICD-10-CM | POA: Diagnosis not present

## 2013-01-30 DIAGNOSIS — R05 Cough: Secondary | ICD-10-CM | POA: Diagnosis not present

## 2013-01-30 DIAGNOSIS — J849 Interstitial pulmonary disease, unspecified: Secondary | ICD-10-CM

## 2013-01-30 DIAGNOSIS — R0600 Dyspnea, unspecified: Secondary | ICD-10-CM

## 2013-01-30 DIAGNOSIS — R059 Cough, unspecified: Secondary | ICD-10-CM | POA: Diagnosis not present

## 2013-01-30 DIAGNOSIS — R053 Chronic cough: Secondary | ICD-10-CM

## 2013-01-30 DIAGNOSIS — J8410 Pulmonary fibrosis, unspecified: Secondary | ICD-10-CM | POA: Diagnosis not present

## 2013-01-30 DIAGNOSIS — R0609 Other forms of dyspnea: Secondary | ICD-10-CM | POA: Diagnosis not present

## 2013-01-30 LAB — CBC WITH DIFFERENTIAL/PLATELET
Basophils Absolute: 0 10*3/uL (ref 0.0–0.1)
Basophils Relative: 0.4 % (ref 0.0–3.0)
Eosinophils Absolute: 0.2 10*3/uL (ref 0.0–0.7)
Eosinophils Relative: 3 % (ref 0.0–5.0)
HCT: 32.4 % — ABNORMAL LOW (ref 36.0–46.0)
Hemoglobin: 11.3 g/dL — ABNORMAL LOW (ref 12.0–15.0)
Lymphocytes Relative: 36.1 % (ref 12.0–46.0)
Lymphs Abs: 2.1 10*3/uL (ref 0.7–4.0)
MCHC: 34.8 g/dL (ref 30.0–36.0)
MCV: 90.3 fl (ref 78.0–100.0)
Monocytes Absolute: 0.3 10*3/uL (ref 0.1–1.0)
Monocytes Relative: 4.7 % (ref 3.0–12.0)
Neutro Abs: 3.3 10*3/uL (ref 1.4–7.7)
Neutrophils Relative %: 55.8 % (ref 43.0–77.0)
Platelets: 313 10*3/uL (ref 150.0–400.0)
RBC: 3.59 Mil/uL — ABNORMAL LOW (ref 3.87–5.11)
RDW: 17.2 % — ABNORMAL HIGH (ref 11.5–14.6)
WBC: 5.9 10*3/uL (ref 4.5–10.5)

## 2013-01-30 LAB — BASIC METABOLIC PANEL
BUN: 16 mg/dL (ref 6–23)
CO2: 25 mEq/L (ref 19–32)
Calcium: 9.8 mg/dL (ref 8.4–10.5)
Chloride: 104 mEq/L (ref 96–112)
Creatinine, Ser: 0.9 mg/dL (ref 0.4–1.2)
GFR: 68.42 mL/min (ref >60.00)
Glucose, Bld: 95 mg/dL (ref 70–99)
Potassium: 3.9 mEq/L (ref 3.5–5.1)
Sodium: 140 mEq/L (ref 135–145)

## 2013-01-30 LAB — CK: Total CK: 21 U/L (ref 7–177)

## 2013-01-30 LAB — SEDIMENTATION RATE: Sed Rate: 17 mm/hr (ref 0–22)

## 2013-01-31 LAB — RHEUMATOID FACTOR: Rhuematoid fact SerPl-aCnc: <10 IU/mL (ref <=14)

## 2013-01-31 LAB — ANCA SCREEN W REFLEX TITER
Atypical p-ANCA Screen: NEGATIVE
c-ANCA Screen: NEGATIVE
p-ANCA Screen: NEGATIVE

## 2013-01-31 LAB — ANA: Anti Nuclear Antibody(ANA): NEGATIVE

## 2013-01-31 LAB — CYCLIC CITRUL PEPTIDE ANTIBODY, IGG: Cyclic Citrullin Peptide Ab: <2 U/mL (ref 0.0–5.0)

## 2013-02-01 LAB — ALDOLASE: Aldolase: 4.4 U/L (ref <=8.1)

## 2013-02-02 ENCOUNTER — Ambulatory Visit (INDEPENDENT_AMBULATORY_CARE_PROVIDER_SITE_OTHER): Payer: Medicare Other | Admitting: Internal Medicine

## 2013-02-02 ENCOUNTER — Ambulatory Visit (INDEPENDENT_AMBULATORY_CARE_PROVIDER_SITE_OTHER)
Admission: RE | Admit: 2013-02-02 | Discharge: 2013-02-02 | Disposition: A | Discharge status: Home / Self Care | Payer: Medicare Other | Source: Ambulatory Visit | Attending: Internal Medicine | Admitting: Internal Medicine

## 2013-02-02 ENCOUNTER — Encounter: Payer: Self-pay | Admitting: Internal Medicine

## 2013-02-02 VITALS — BP 140/68 | HR 71 | Temp 97.7°F | Ht 66.0 in | Wt 285.0 lb

## 2013-02-02 DIAGNOSIS — R05 Cough: Secondary | ICD-10-CM

## 2013-02-02 DIAGNOSIS — R0602 Shortness of breath: Secondary | ICD-10-CM | POA: Diagnosis not present

## 2013-02-02 DIAGNOSIS — R059 Cough, unspecified: Secondary | ICD-10-CM | POA: Diagnosis not present

## 2013-02-02 DIAGNOSIS — R0989 Other specified symptoms and signs involving the circulatory and respiratory systems: Secondary | ICD-10-CM | POA: Diagnosis not present

## 2013-02-02 DIAGNOSIS — R053 Chronic cough: Secondary | ICD-10-CM

## 2013-02-02 DIAGNOSIS — J988 Other specified respiratory disorders: Secondary | ICD-10-CM | POA: Diagnosis not present

## 2013-02-02 DIAGNOSIS — R0600 Dyspnea, unspecified: Secondary | ICD-10-CM

## 2013-02-02 DIAGNOSIS — R0609 Other forms of dyspnea: Secondary | ICD-10-CM | POA: Diagnosis not present

## 2013-02-02 DIAGNOSIS — J398 Other specified diseases of upper respiratory tract: Secondary | ICD-10-CM | POA: Diagnosis not present

## 2013-02-02 NOTE — Progress Notes (Signed)
Subjective:    Patient ID: Natasha Ellis, female    DOB: 1930/09/05, 79 y.o.   MRN: [id number] PCP Ginette Otto, MD  HPI  Body mass index is 46.02 kg/(m^2).  reports that she quit smoking about 33 years ago. Her smoking use included Cigarettes. She has a 25 pack-year smoking history. She has never used smokeless tobacco.  IOV 12/14/2012  79 year old female.    CHronic cough x 11 years. Insidous onset. COugh worsenend in spring and fall. Quality - has sputum that is variable color yellow and white. Cough is mild but sometimes severe. When bad she is throwing up and gasping for breath. STable course. No associated tickle, gag, clearing of throat. THere is associatd post nasal drip but no acid reflux   Also, dyspnea. Insidious onet. Few to several months. Noticed it after she quit oxgen in sept 2013 when she moved from [place]. Was using only nocutrnal oxygen.     Cough relevnt hx  #Sinus/ENT/Allergy hx  - reports chronic post nasal drip - has seen ENT for lymphoma in neck  Many years ago but not for cough - denies hx of pollent allergies with sneezin but says she coughs inn pollen time - takes nasal steroids prn  #GI/GERD  - denies GERD - vehmently denies aspiration  #Misc - Low grage B cell diagnosed 2004. S/p chemo last 3 years ago at [place]. As of 2014: in [place]   #Pulmonary hx  - Pulmonary nodules: IN 2008 and 2009 and 2011 - small stable nodules per CT report at [facility]  - OSA - diagnosed sleep apnea . Using CPAP. But got rid of overnight oxygen in sept and an 2013 when she moved to [place] due to logistics   - hx of PFTs n [facility]; does not know details  - OF note, she is on symbicort for a diagnosis of "chronic bronchitis"  #Exposure  Smoing:  reports that she quit smoking about 33 years ago. Her smoking use included Cigarettes. She has a 25 pack-year smoking history. She has never used smokeless tobacco.   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  [place]oved [place] in Sept 2013     REC Sign release to get records from [facility]  For office notes, pft Have full PFT breathing test I will talk to Dr Truett Perna about getting just chest ct or chest ct with abdominal CT followup after tests    PFT 12/19/12  - Normal spiro, lung function but DLCO 56%. Will get CT chest   Telephoine call EndMay/Early June 2014:   reviewed CT scan of the chest 12/30/2012. Several abnormalities   1. she has evidence of some chronic inflammation in the lungs. I really need CD-ROM of CT scan from [place] and pulmonary notes from [place]. Please have the patient get them to me.   2. she also has calcium deposits in the blood vessels of the heart. So please refer her to cardiology. I have done an order  3. At time of followup I do need the [facility] records      Acute vsiit. 01/16/13  - Says dyspnea getting worse. Dyspnea present for years (last vist she told me months). Was stable. ASt last OV cough was dominant symptoms but last few weeks dyspnea worse and has become dominant symptoms. Now class 2-3 exertiona. RElieved with ret. No orthopnea and  No pnd. REpotedly had cardiac stress test with Dr Mendel Ryder and normal (last few weeks). No recent fever  but did have these in April 2014.  Of note, in feb 2014 she gave up PT at the [facility] home due to lack of energy. Dyspnea is associated with fatigue. In terms of fatigue: hgb 11gm% in March 2014 and started on Iron. She is on Vit D supplementation and she reports normal thyroid function  In terms of cough: still the same. She does have periodic yellow sputum just as before. Cough x 11 years. She is hesitant to have bronch because of pripor experience when she got MRSA infection in2009 after bronch per report. Denies aspiration  Walk test Walking desaturation test on6/16/14  185 feet x 3 laps:  Did NOT desaturate. Able to barely do Only 1 LAP and then stopped due to dyspnea.  Rest pulse ox was  97%, final pulse ox was 95%. HR response 91/min at rest to 106/min at peak exertion.  EMail from Dr Jacquelyne Balint at [facility] 01/25/13   -spirometry - mostly saw her for her OSA and recurrent bronchitis;   - last seen by myself in 2012;   - was hospitalized in April 2014 for 3 days due to pneumonia and managed by her PCP following knee surgery.  CXR was clear by June 2013.  - She had normal spirometry in 2012 - do not believe we did PFTs as dyspnea was never a prominent complaint.  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  I had maintained her on symbicort as it appeared to control the episodic bronchospasm.  However, she is probably at risk for opportunistic infections.    - Last CT chest was 2011 and did not reveal any parenchymal changes except for minimal scarring of LLL.    - She  is a remote tobacco user  I spoke to her on 01/25/13  after getting info from DR Jacquelyne Balint at [facility]:  he says she  had normal scan last few years. Patient now continues to have dyspnea though handled cataract surgery well few weeks ago and is scheduled for another one tomorrow 01/2613  REC   #shortness of breath  - will test for oxygen levels at night on room air while using CPAP  #cough  - I will wait to hear from Dr Jacquelyne Balint  #Followup - keep 02/02/13 visit  - wait to hear more from me   Telephone call 01/25/13 Tke levaquin 500mg  once daily  X 5 days. I have ordered auutoimmune lab work, please have her do that     OV 02/02/2013 FU cough and dyspnea  - since antibiotic Rx cough and dyspnea 25% better but still with residual symptoms. SHe is freustrated by her symptomatology. Objectively no change in RSI cough score. Labs 01/30/13:  SEd rate 17. Autoimmune panel - negative. ONO test is still pending.    CXR today  Dg Chest 2 View  02/02/2013   *RADIOLOGY REPORT*  Clinical Data: History of chronic coughing and  pulmonary infiltrates.  CHEST - 2 VIEW  Comparison: 09/26/2012.  CT 12/30/2012.  Findings: Cardiac silhouette is upper range of normal size. Ectasia and nonaneurysmal calcification of the thoracic aorta are seen. Mediastinal and hilar contours appear stable.  There is chronic elevation of the right hemidiaphragm with slight increased markings in the right medial base.  No consolidation or pleural effusion is seen.  Tiny area of vague nodularity projects over the anterior aspect of the right third rib on the PA image appears unchanged. This could reflect vessel  on end or a small granuloma.  There is some central peribronchial thickening with slight increase in perihilar markings.  Flattening of the diaphragm on lateral images seen consistent L of hyperinflation.  Minimal degenerative spondylosis is present.  There is also syndesmophyte formation in the spine.  There is osteopenic appearance of bones. Haziness over costophrenic angle region on PA image most likely is secondary to superimposed soft tissues.  IMPRESSION: Flattening of diaphragm on lateral image with overall hyperinflation configuration.  Chronic elevation of right hemidiaphragm with slight chronic increased markings in right medial base.  Stable tiny area of nodularity over anterior aspect of right third rib.  Central peribronchial thickening.  Slight increased perihilar markings.  No consolidation or pleural effusion is seen.   Original Report Authenticated By: Onalee Hua Call       Dr Gretta Cool Reflux Symptom Index (> 13-15 suggestive of LPR cough) 0 -> 5  =  none ->severe problem 12/14/2012  02/02/2013   Hoarseness or problem with voice 2 0  Clearing  Of Throat 2 0  Excess throat mucus or feeling of post nasal drip 4 4  Difficulty swallowing food, liquid or tablets 0 0  Cough after eating or lying down 0 2  Breathing difficulties or choking episodes 1 0  Troublesome or annoying cough 4 4  Sensation of something sticking in throat or lump in  throat 0 4  Heartburn, chest pain, indigestion, or stomach acid coming up 0 0  TOTAL 13 14       Kouffman Reflux v Neurogenic Cough Differentiator Reflux 02/02/2013   Do you awaken from a sound sleep coughing violently?                            With trouble breathing? Yes  Do you have choking episodes when you cannot  Get enough air, gasping for air ?              Yes  Do you usually cough when you lie down into  The bed, or when you just lie down to rest ?                          no  Do you usually cough after meals or eating?         no  Do you cough when (or after) you bend over?    Yes  GERD SCORE  3  Kouffman Reflux v Neurogenic Cough Differentiator Neurogenic  Do you more-or-less cough all day long? yes  Does change of temperature make you cough? no  Does laughing or chuckling cause you to cough? yes  Do fumes (perfume, automobile fumes, burned  Toast, etc.,) cause you to cough ?      Cannot smell  Does speaking, singing, or talking on the phone cause you to cough   ?               yes  Neurogenic/Airway score 3     Review of Systems  Constitutional: Negative for fever and unexpected weight change.  HENT: Positive for postnasal drip. Negative for ear pain, nosebleeds, congestion, sore throat, rhinorrhea, sneezing, trouble swallowing, dental problem and sinus pressure.   Eyes: Negative for redness and itching.  Respiratory: Negative for cough, chest tightness, shortness of breath and wheezing.   Cardiovascular: Positive for leg swelling. Negative for palpitations.  Gastrointestinal: Negative for nausea and vomiting.  Genitourinary: Negative for  dysuria.  Musculoskeletal: Negative for joint swelling.  Skin: Negative for rash.  Neurological: Negative for headaches.  Hematological: Does not bruise/bleed easily.  Psychiatric/Behavioral: Negative for dysphoric mood. The patient is not nervous/anxious.        Objective:   Physical Exam   Physical Exam  Vitals  reviewed. Constitutional: She is oriented to person, place, and time. She appears well-developed and well-nourished. No distress.  Body mass index is 46.02 kg/(m^2).   HENT:  Head: Normocephalic and atraumatic.  Right Ear: External ear normal.  Left Ear: External ear normal.  Mouth/Throat: Oropharynx is clear and moist. No oropharyngeal exudate.  Eyes: Conjunctivae and EOM are normal. Pupils are equal, round, and reactive to light. Right eye exhibits no discharge. Left eye exhibits no discharge. No scleral icterus.  Neck: Normal range of motion. Neck supple. No JVD present. No tracheal deviation present. No thyromegaly present.  Cardiovascular: Normal rate, regular rhythm, normal heart sounds and intact distal pulses.  Exam reveals no gallop and no friction rub.   No murmur heard. Pulmonary/Chest: Effort normal and breath sounds normal. No respiratory distress. She has no wheezes. She has ? rales. She exhibits no tenderness.  Abdominal: Soft. Bowel sounds are normal. She exhibits no distension and no mass. There is no tenderness. There is no rebound and no guarding.  Musculoskeletal: Normal range of motion. She exhibits no edema and no tenderness.  Lymphadenopathy:    She has no cervical adenopathy.  Neurological: She is alert and oriented to person, place, and time. She has normal reflexes. No cranial nerve deficit. She exhibits normal muscle tone. Coordination normal.  Skin: Skin is warm and dry. No rash noted. She is not diaphoretic. No erythema. No pallor.  Psychiatric: She has a normal mood and affect. Her behavior is normal. Judgment and thought content normal.             Assessment & Plan:

## 2013-02-02 NOTE — Patient Instructions (Addendum)
Pleaes have CXR today; will call next week with results Depending on cxr results have to time next scan of chest Sign release to get records from Dr Roselee Nova Fax; [phone] Meanwile, try sample Pulmicort 1 puff twice daily for your cough Will await ONO results Follwoup depending on xray results

## 2013-02-03 NOTE — Assessment & Plan Note (Signed)
Nuc med stress test per her hx negative. She does not desat with exertion. Suspet obesity/deconditoning playing a role. However, cannot proceed with dyspnea workup without ensuring infitlrates clear up. FO now will aawai ONO on rooma ir

## 2013-02-03 NOTE — Assessment & Plan Note (Addendum)
Cough some better after Rx of infiltrates with antibiotics. Cannot make headway into Rx of long standing one decade old chronic cough without clarance of infiltrates. Unclear cause for infiltrate. ESR and autoimmune negative. She denies aspiration. Clinically responded somewhat to antibiotics. CXR today not revealing. Will trial pulmicort and repeat scan in a fe wmonths and reassess

## 2013-02-06 ENCOUNTER — Ambulatory Visit
Admission: RE | Admit: 2013-02-06 | Discharge: 2013-02-06 | Disposition: A | Discharge status: Home / Self Care | Payer: Medicare Other | Source: Ambulatory Visit

## 2013-02-06 DIAGNOSIS — Z1231 Encounter for screening mammogram for malignant neoplasm of breast: Secondary | ICD-10-CM

## 2013-02-06 LAB — SJOGRENS SYNDROME-A EXTRACTABLE NUCLEAR ANTIBODY: SSA (Ro) (ENA) Antibody, IgG: <1 AI (ref <1.0)

## 2013-02-06 LAB — MPO/PR-3 (ANCA) ANTIBODIES
Myeloperoxidase Abs: <1 AI (ref <1.0)
Serine Protease 3: <1 AI (ref <1.0)

## 2013-02-06 LAB — SJOGRENS SYNDROME-B EXTRACTABLE NUCLEAR ANTIBODY: SSB (La) (ENA) Antibody, IgG: <1 AI (ref <1.0)

## 2013-02-06 LAB — ANTI-SCLERODERMA ANTIBODY: Scleroderma (Scl-70) (ENA) Antibody, IgG: <1 AI (ref <1.0)

## 2013-02-07 ENCOUNTER — Telehealth: Payer: Self-pay | Admitting: Internal Medicine

## 2013-02-07 DIAGNOSIS — J189 Pneumonia, unspecified organism: Secondary | ICD-10-CM

## 2013-02-07 NOTE — Telephone Encounter (Signed)
Let her know that cxr early July 2014 did not reveal much in way of pneumonia. So she needs to finish CT scan mid-aug 2014 and then see me. Please set up fu. I have ordered CT   Dr. Kalman Shan, M.D., [facility].C.P Pulmonary and Critical Care Medicine Staff Physician [facility] [place] [facility] and Critical [facility] Pager: [phone], If no answer or between  15:00h - 7:00h: call [phone]  02/07/2013 1:29 PM

## 2013-02-07 NOTE — Telephone Encounter (Signed)
Returning call can be reached at [phone].Natasha Ellis

## 2013-02-07 NOTE — Telephone Encounter (Signed)
lmomtcb x1 for pt 

## 2013-02-07 NOTE — Telephone Encounter (Signed)
LM with a female to have pt call back.

## 2013-02-08 NOTE — Telephone Encounter (Signed)
Spoke with pt and given MR recommendations to have repeat CT in mid August and cxr results from July.  Pt verbalized understanding and will wait to hear form [facility] regarding date of Ct.  PT also wants to let MR know that she tried Pulmicort for 4 days but started having eye swelling and cough became worse.  She went back to Symbicort 80.

## 2013-02-08 NOTE — Telephone Encounter (Signed)
Ok that is fine to go back to symbicort   Dr. Kalman Shan, M.D., [facility].C.P Pulmonary and Critical Care Medicine Staff Physician [place] [facility] [place] [facility] and Critical Care Pager: [phone], If no answer or between  15:00h - 7:00h: call [phone]  02/08/2013 11:18 AM

## 2013-02-15 ENCOUNTER — Telehealth: Payer: Self-pay | Admitting: Internal Medicine

## 2013-02-15 DIAGNOSIS — G4733 Obstructive sleep apnea (adult) (pediatric): Secondary | ICD-10-CM

## 2013-02-15 NOTE — Telephone Encounter (Signed)
ONO 02/02/13 shows pulse ox < 88% fro 5h and out of total test tme of 7h.SEVERE DESATS.  She needs to a sleep Doc. Who is hers? She is using CPAP without o2 and this is not correct  Please refer her to one of our sleep docs. I think this is why she is fatigued  Dr. Kalman Shan, M.D., [facility].C.P Pulmonary and Critical Care Medicine Staff Physician [place] [facility] [place] [facility]monary and Critical Care Pager: [phone], If no answer or between  15:00h - 7:00h: call [phone]  02/15/2013 5:44 PM

## 2013-02-21 ENCOUNTER — Telehealth: Payer: Self-pay | Admitting: Internal Medicine

## 2013-02-21 DIAGNOSIS — I10 Essential (primary) hypertension: Secondary | ICD-10-CM | POA: Diagnosis not present

## 2013-02-21 DIAGNOSIS — C4491 Basal cell carcinoma of skin, unspecified: Secondary | ICD-10-CM | POA: Diagnosis not present

## 2013-02-21 DIAGNOSIS — R05 Cough: Secondary | ICD-10-CM | POA: Diagnosis not present

## 2013-02-21 DIAGNOSIS — R350 Frequency of micturition: Secondary | ICD-10-CM | POA: Diagnosis not present

## 2013-02-21 DIAGNOSIS — D649 Anemia, unspecified: Secondary | ICD-10-CM | POA: Diagnosis not present

## 2013-02-21 DIAGNOSIS — R059 Cough, unspecified: Secondary | ICD-10-CM | POA: Diagnosis not present

## 2013-02-21 DIAGNOSIS — R509 Fever, unspecified: Secondary | ICD-10-CM | POA: Diagnosis not present

## 2013-02-21 NOTE — Telephone Encounter (Signed)
rec'd records from [facility]. Forward 8 pages to Dr. Marchelle Gearing

## 2013-02-22 ENCOUNTER — Ambulatory Visit: Payer: Medicare Other | Admitting: Cardiology

## 2013-02-22 NOTE — Telephone Encounter (Signed)
I spoke with the pt and advised of results. Pt wants to speak with her PCP before setting up a sleep doc appt. Do you want me to go ahead and order the pt oxygen? She had her ONO on 02-02-13 so we will need to order within 30 days. Please advise. Carron Curie, CMA

## 2013-02-23 DIAGNOSIS — R509 Fever, unspecified: Secondary | ICD-10-CM | POA: Diagnosis not present

## 2013-03-01 NOTE — Telephone Encounter (Signed)
Pt has an appt with sleep doctor, Dr. Earl Gala at [facility].  I advised the pt that I will send order for oxygen to [facility]. Carron Curie, CMA

## 2013-03-01 NOTE — Telephone Encounter (Signed)
Pt states that she hasn't rec'd O2 concentrator for her cpap.  Pt states this was ordered last week.  Pt states that [facility] is really awful when trying to get things sent to her.  Pt asks for [place] to help & can be reached at [phone].  Antionette Fairy

## 2013-03-01 NOTE — Telephone Encounter (Signed)
Change home care to [facility] or [facility]  I still think she needs to sleep doc regardless of what her pcp Ginette Otto, MD says. She is welcome to check with him though   Dr. Kalman Shan, M.D., [facility].C.P Pulmonary and Critical Care Medicine Staff Physician [place] [facility] [place] Pulmonary and Critical Care Pager: [phone], If no answer or between  15:00h - 7:00h: call [phone]  03/01/2013 3:34 PM

## 2013-03-02 ENCOUNTER — Encounter: Payer: Self-pay | Admitting: Internal Medicine

## 2013-03-02 ENCOUNTER — Encounter: Payer: Self-pay | Admitting: Cardiology

## 2013-03-02 DIAGNOSIS — G4733 Obstructive sleep apnea (adult) (pediatric): Secondary | ICD-10-CM | POA: Diagnosis not present

## 2013-03-03 ENCOUNTER — Telehealth: Payer: Self-pay | Admitting: Internal Medicine

## 2013-03-03 NOTE — Telephone Encounter (Signed)
Order faxed to [facility] per patient request for o2. Pt was on o2 when she lived in [place] with [facility]. Pt moved to [facility] on 04/08/12 and has not used o2 since she has been here. Pt was on o2 with [facility] for 31 months and has reached the capped out period with Medicare, therefore, [facility] will not be able to pick pt's o2 order up because of the medicare issue.  Called [facility] since they supplied patient with o2 in [place]. Since patient has been in [place] since 04/08/12 and hasn't used o2 with her CPAP,  according to [facility], Medicare requires that patient will have to re-qualify for o2 with CPAP by sending patient to have a CPAP Titration Study in the sleep lab and have sleep lab titrate patient on cpap and show that even with cpap pt will need o2. Once this has been done, o2 can be re-started, however, pt must continue to obtain o2 from [facility].  Called and spoke with patient and she stated that she saw a sleep physician yesterday at [facility], Dr. Theressa Millard. Pt stated that she would contact him and advise him of the above and to order her a cpap titration study to re-qualify her for the o2. Advised patient to call me if she had any other questions. [name]

## 2013-03-03 NOTE — Telephone Encounter (Signed)
Per Bjorn Loser will need send to MR as an FYI for the process

## 2013-03-08 ENCOUNTER — Other Ambulatory Visit: Payer: Self-pay

## 2013-03-20 ENCOUNTER — Other Ambulatory Visit: Payer: Medicare Other

## 2013-03-20 DIAGNOSIS — D235 Other benign neoplasm of skin of trunk: Secondary | ICD-10-CM | POA: Diagnosis not present

## 2013-03-20 DIAGNOSIS — L570 Actinic keratosis: Secondary | ICD-10-CM | POA: Diagnosis not present

## 2013-03-20 DIAGNOSIS — D485 Neoplasm of uncertain behavior of skin: Secondary | ICD-10-CM | POA: Diagnosis not present

## 2013-03-20 DIAGNOSIS — D1801 Hemangioma of skin and subcutaneous tissue: Secondary | ICD-10-CM | POA: Diagnosis not present

## 2013-03-20 DIAGNOSIS — Z85828 Personal history of other malignant neoplasm of skin: Secondary | ICD-10-CM | POA: Diagnosis not present

## 2013-03-23 DIAGNOSIS — G4733 Obstructive sleep apnea (adult) (pediatric): Secondary | ICD-10-CM | POA: Diagnosis not present

## 2013-03-30 ENCOUNTER — Ambulatory Visit (INDEPENDENT_AMBULATORY_CARE_PROVIDER_SITE_OTHER): Payer: Medicare Other | Admitting: Internal Medicine

## 2013-03-30 ENCOUNTER — Telehealth: Payer: Self-pay | Admitting: Internal Medicine

## 2013-03-30 ENCOUNTER — Encounter: Payer: Self-pay | Admitting: Internal Medicine

## 2013-03-30 ENCOUNTER — Ambulatory Visit (INDEPENDENT_AMBULATORY_CARE_PROVIDER_SITE_OTHER)
Admission: RE | Admit: 2013-03-30 | Discharge: 2013-03-30 | Disposition: A | Discharge status: Home / Self Care | Payer: Medicare Other | Source: Ambulatory Visit | Attending: Internal Medicine | Admitting: Internal Medicine

## 2013-03-30 ENCOUNTER — Other Ambulatory Visit (INDEPENDENT_AMBULATORY_CARE_PROVIDER_SITE_OTHER): Payer: Medicare Other

## 2013-03-30 VITALS — BP 122/72 | HR 83 | Temp 98.2°F | Ht 67.0 in | Wt 284.6 lb

## 2013-03-30 DIAGNOSIS — R0989 Other specified symptoms and signs involving the circulatory and respiratory systems: Secondary | ICD-10-CM

## 2013-03-30 DIAGNOSIS — R0609 Other forms of dyspnea: Secondary | ICD-10-CM | POA: Diagnosis not present

## 2013-03-30 DIAGNOSIS — J479 Bronchiectasis, uncomplicated: Secondary | ICD-10-CM | POA: Diagnosis not present

## 2013-03-30 DIAGNOSIS — R0600 Dyspnea, unspecified: Secondary | ICD-10-CM

## 2013-03-30 DIAGNOSIS — J189 Pneumonia, unspecified organism: Secondary | ICD-10-CM | POA: Diagnosis not present

## 2013-03-30 LAB — BRAIN NATRIURETIC PEPTIDE: Pro B Natriuretic peptide (BNP): 70 pg/mL (ref 0.0–100.0)

## 2013-03-30 MED ORDER — FUROSEMIDE 20 MG PO TABS: 20.0000 mg | ORAL_TABLET | Freq: Every day | ORAL | Status: DC

## 2013-03-30 NOTE — Progress Notes (Signed)
Subjective:    Patient ID: Natasha Ellis, female    DOB: [birth year], 79 y.o.   MRN: [id number]  HPI Body mass index is 46.02 kg/(m^2).  reports that she quit smoking about 33 years ago. Her smoking use included Cigarettes. She has a 25 pack-year smoking history. She has never used smokeless tobacco.  IOV 12/14/2012  79 year old female.    CHronic cough x 11 years. Insidous onset. COugh worsenend in spring and fall. Quality - has sputum that is variable color yellow and white. Cough is mild but sometimes severe. When bad she is throwing up and gasping for breath. STable course. No associated tickle, gag, clearing of throat. THere is associatd post nasal drip but no acid reflux   Also, dyspnea. Insidious onet. Few to several months. Noticed it after she quit oxgen in sept 2013 when she moved from [place] to [place]. Was using only nocutrnal oxygen.     Cough relevnt hx  #Sinus/ENT/Allergy hx  - reports chronic post nasal drip - has seen ENT for lymphoma in neck  Many years ago but not for cough - denies hx of pollent allergies with sneezin but says she coughs inn pollen time - takes nasal steroids prn  #GI/GERD  - denies GERD - vehmently denies aspiration  #Misc - [place] gr[place] B cell diagnosed 2004. S/p chemo last 3 years ago at [facility]. As of 2014: in [place]   #Pulmonary hx  - Pulmonary nodules: IN 2008 and 2009 and 2011 - small stable nodules per CT report at [facility]  - OSA - diagnosed sleep apnea . Using CPAP. But got rid of overnight oxygen in sept and an 2013 when she moved to [place] due to logistics   - hx of PFTs n [place]; does not know details  - OF note, she is on symbicort for a diagnosis of "chronic bronchitis"  #Exposure  Smoing:  reports that she quit smoking about 33 years ago. Her smoking use included Cigarettes. She has a 25 pack-year smoking history. She has never used smokeless tobacco.   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 Moved from [place] in Sept  2013     REC Sign release to get records from [facility]  For office notes, pft Have full PFT breathing test I will talk to Dr Truett Perna about getting just chest ct or chest ct with abdominal CT followup after tests    PFT 12/19/12  - Normal spiro, lung function but DLCO 56%. Will get CT chest   Telephoine call EndMay/Early June 2014:   reviewed CT scan of the chest 12/30/2012. Several abnormalities   1. she has evidence of some chronic inflammation in the lungs. I really need CD-ROM of CT scan from [place] and pulmonary notes from [place]. Please have the patient get them to me.   2. she also has calcium deposits in the blood vessels of the heart. So please refer her to cardiology. I have done an order  3. At time of followup I do need the [facility] records      Acute vsiit. 01/16/13  - Says dyspnea getting worse. Dyspnea present for years (last vist she told me months). Was stable. ASt last OV cough was dominant symptoms but last few weeks dyspnea worse and has become dominant symptoms. Now class 2-3 exertiona. RElieved with ret. No orthopnea and  No pnd. REpotedly had cardiac stress test with Dr Mendel Ryder and normal (last few weeks). No recent fever  but did have these  in April 2014.  Of note, in feb 2014 she gave up PT at the [facility] home due to lack of energy. Dyspnea is associated with fatigue. In terms of fatigue: hgb 11gm% in March 2014 and started on Iron. She is on Vit D supplementation and she reports normal thyroid function  In terms of cough: still the same. She does have periodic yellow sputum just as before. Cough x 11 years. She is hesitant to have bronch because of pripor experience when she got MRSA infection in2009 after bronch per report. Denies aspiration  Walk test Walking desaturation test on6/16/14  185 feet x 3 laps:  Did NOT desaturate. Able to barely do Only 1 LAP and then stopped due to dyspnea.  Rest pulse ox was 97%, final pulse ox was 95%.  HR response 91/min at rest to 106/min at peak exertion.  EMail from Dr Jacquelyne Balint at [facility] 01/25/13   -spirometry - mostly saw her for her OSA and recurrent bronchitis;   - last seen by myself in 2012;   - was hospitalized in April 2014 for 3 days due to pneumonia and managed by her PCP following knee surgery.  CXR was clear by June 2013.  - She had normal spirometry in 2012 - do not believe we did PFTs as dyspnea was never a prominent complaint.  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  I had maintained her on symbicort as it appeared to control the episodic bronchospasm.  However, she is probably at risk for opportunistic infections.    - Last CT chest was 2011 and did not reveal any parenchymal changes except for minimal scarring of LLL.    - She  is a remote tobacco user  I spoke to her on 01/25/13  after getting info from DR Jacquelyne Balint at [facility]:  he says she  had normal scan last few years. Patient now continues to have dyspnea though handled cataract surgery well few weeks ago and is scheduled for another one tomorrow 01/2613  REC   #shortness of breath  - will test for oxygen levels at night on room air while using CPAP  #cough  - I will wait to hear from Dr Jacquelyne Balint  #Followup - keep 02/02/13 visit  - wait to hear more from me   Telephone call 01/25/13 Tke levaquin 500mg  once daily  X 5 days. I have ordered auutoimmune lab work, please have her do that     OV 02/02/2013 FU cough and dyspnea  - since antibiotic Rx cough and dyspnea 25% better but still with residual symptoms. SHe is freustrated by her symptomatology. Objectively no change in RSI cough score. Labs 01/30/13:  SEd rate 17. Autoimmune panel - negative. ONO test is still pending.    CXR today  Dg Chest 2 View  02/02/2013   *RADIOLOGY REPORT*  Clinical Data: History of chronic coughing and pulmonary infiltrates.  CHEST - 2  VIEW  Comparison: 09/26/2012.  CT 12/30/2012.  Findings: Cardiac silhouette is upper range of normal size. Ectasia and nonaneurysmal calcification of the thoracic aorta are seen. Mediastinal and hilar contours appear stable.  There is chronic elevation of the right hemidiaphragm with slight increased markings in the right medial base.  No consolidation or pleural effusion is seen.  Tiny area of vague nodularity projects over the anterior aspect of the right third rib on the PA image appears unchanged. This could reflect vessel on end or a  small granuloma.  There is some central peribronchial thickening with slight increase in perihilar markings.  Flattening of the diaphragm on lateral images seen consistent L of hyperinflation.  Minimal degenerative spondylosis is present.  There is also syndesmophyte formation in the spine.  There is osteopenic appearance of bones. Haziness over costophrenic angle region on PA image most likely is secondary to superimposed soft tissues.  IMPRESSION: Flattening of diaphragm on lateral image with overall hyperinflation configuration.  Chronic elevation of right hemidiaphragm with slight chronic increased markings in right medial base.  Stable tiny area of nodularity over anterior aspect of right third rib.  Central peribronchial thickening.  Slight increased perihilar markings.  No consolidation or pleural effusion is seen.   Original Report Authenticated By: Onalee Hua Call    REC Please have CXR today; will call next week with results Depending on cxr results have to time next scan of chest Sign release to get records from Dr Roselee Nova Fax; [phone] [facility], try sample Pulmicort 1 puff twice daily for your cough Will await ONO results [place] depending on xray results    OV 03/30/2013  She still complaining of persistent dyspnea. is worse. Dyspnea is class III. Walking from walkin 10 feet  brings on dyspnea. She had CT scan of the chest today and this shows  significant clearing of her infiltrates compared to May 2014. She's really suppressed with this result because her dyspnea is worse. She's having chronic cough without any change. She's having mild low sputum that is without any change. Since last visit she has registered with Dr. Earl Gala sleep specialist and oxygen has been added nocturnal CPAP. She is due to start her oxygen tomorrow. She is full of her sense of doom and gloom because of worsening dyspnea. Denies any associated chest pain or fever. No change in chronic venous stasis edema. Denies any hemoptysis or pleuritic chest pain  Review of Systems  Constitutional: Negative for fever and unexpected weight change.  HENT: Negative for ear pain, nosebleeds, congestion, sore throat, rhinorrhea, sneezing, trouble swallowing, dental problem, postnasal drip and sinus pressure.   Eyes: Negative for redness and itching.  Respiratory: Positive for cough and shortness of breath. Negative for chest tightness and wheezing.   Cardiovascular: Positive for leg swelling. Negative for palpitations.  Gastrointestinal: Negative for nausea and vomiting.  Genitourinary: Negative for dysuria.  Musculoskeletal: Negative for joint swelling.  Skin: Negative for rash.  Neurological: Negative for headaches.  Hematological: Does not bruise/bleed easily.  Psychiatric/Behavioral: Negative for dysphoric mood. The patient is not nervous/anxious.        Objective:   Physical Exam  Vitals reviewed. Constitutional: She is oriented to person, place, and time. She appears well-developed and well-nourished. No distress.  Body mass index is 44.56 kg/(m^2). Sitting in wheel chair Flat affect  HENT:  Head: Normocephalic and atraumatic.  Right Ear: External ear normal.  Left Ear: External ear normal.  Mouth/Throat: Oropharynx is clear and moist. No oropharyngeal exudate.  Eyes: Conjunctivae and EOM are normal. Pupils are equal, round, and reactive to light. Right eye  exhibits no discharge. Left eye exhibits no discharge. No scleral icterus.  Neck: Normal range of motion. Neck supple. No JVD present. No tracheal deviation present. No thyromegaly present.  Cardiovascular: Normal rate, regular rhythm, normal heart sounds and intact distal pulses.  Exam reveals no gallop and no friction rub.   No murmur heard. Pulmonary/Chest: Effort normal and breath sounds normal. No respiratory distress. She has no wheezes. She has no  rales. She exhibits no tenderness.  Abdominal: Soft. Bowel sounds are normal. She exhibits no distension and no mass. There is no tenderness. There is no rebound and no guarding.  Musculoskeletal: Normal range of motion. She exhibits no edema and no tenderness.  Chronic venous stasis edema Negative homan  Lymphadenopathy:    She has no cervical adenopathy.  Neurological: She is alert and oriented to person, place, and time. She has normal reflexes. No cranial nerve deficit. She exhibits normal muscle tone. Coordination normal.  Skin: Skin is warm and dry. No rash noted. She is not diaphoretic. No erythema. No pallor.  Psychiatric: She has a normal mood and affect. Her behavior is normal. Judgment and thought content normal.          Assessment & Plan:

## 2013-03-30 NOTE — Patient Instructions (Addendum)
Do blood test D-dimer and BNP IF d-dimer is high, will get VQ scan Start lasix 20mg  once daily in addition to other medications Return mid-next week to report to my NP  - will do blood test chemistries at followup

## 2013-03-30 NOTE — Telephone Encounter (Signed)
xxxx 

## 2013-03-30 NOTE — Telephone Encounter (Signed)
Spoke with the pt She is requesting ct results She is c/o SOB much worse since last month when she was seen and is concerned OV with MR today and 3:30 pm

## 2013-03-30 NOTE — Assessment & Plan Note (Signed)
She still has persistent class III dyspnea despite improvement in her pulmonary infiltrates on CT scan of the chest compared to May 2014. Differential diagnoses at this stage suggests obesity with deconditioning and diastolic dysfunction as the most likely cause of dyspnea. Pulmonary embolism is a low likelihood possibility.  And we'll get BNP and a d-dimer test. If the d-dimer is positive we'll get a VQ scan  I will add Lasix therapy to her hydrochlorothiazide  She should see my nurse practitioner in a week and at the time of followup should get her chemistry check  Of note, we discussed the role of pulmonary stress testing but due to her arthritis she feels that she cannot do this test  > 50% of this > 25 min visit spent in face to face counseling (15 min visit converted to 25 min)

## 2013-03-31 ENCOUNTER — Telehealth: Payer: Self-pay | Admitting: Internal Medicine

## 2013-03-31 DIAGNOSIS — R7989 Other specified abnormal findings of blood chemistry: Secondary | ICD-10-CM

## 2013-03-31 LAB — D-DIMER, QUANTITATIVE (NOT AT ARMC): D-Dimer, Quant: 0.84 ug/mL-FEU — ABNORMAL HIGH (ref 0.00–0.48)

## 2013-03-31 NOTE — Telephone Encounter (Signed)
Called and spoke with patient and advised patient that we were unable to get scan scheduled today, that we will call on Tues to schedule. Pt states that she will be over this way on Thurs. 04/06/13 at 10:45 to see Natasha Ellis and wants to try to get this scheduled sometime Thurs. Wed is not good for the patient. [name]

## 2013-03-31 NOTE — Telephone Encounter (Signed)
lmomtcb x1 for pt  Orders have been placed and will send to [facility] as well

## 2013-03-31 NOTE — Telephone Encounter (Signed)
Attempted to schedule, [facility] Scheduling Dept closed at 3:30 today. Unable to get this scheduled. Will try back on Tues. [name]

## 2013-03-31 NOTE — Telephone Encounter (Signed)
D dimer high  Arrange for VQ scan (CXR and VQ) and Dopler venous leg to rule out PE/DVT. Indicaton  - dyspnea.   Triage pls set it up for early next week or have JC do it   Dr. Kalman Shan, M.D., [facility].C.P Pulmonary and Critical Care Medicine Staff Physician [place] [facility] [place] Pulmonary and Critical Care Pager: [phone], If no answer or between  15:00h - 7:00h: call [phone]  03/31/2013 12:51 PM

## 2013-04-04 ENCOUNTER — Other Ambulatory Visit: Payer: Self-pay | Admitting: Internal Medicine

## 2013-04-04 DIAGNOSIS — R0600 Dyspnea, unspecified: Secondary | ICD-10-CM

## 2013-04-04 NOTE — Telephone Encounter (Signed)
appt 04/06/13 vq scan Tobe Sos

## 2013-04-06 ENCOUNTER — Ambulatory Visit (INDEPENDENT_AMBULATORY_CARE_PROVIDER_SITE_OTHER): Payer: Medicare Other | Admitting: Adult Health

## 2013-04-06 ENCOUNTER — Ambulatory Visit (HOSPITAL_COMMUNITY)
Admission: RE | Admit: 2013-04-06 | Discharge: 2013-04-06 | Disposition: A | Discharge status: Home / Self Care | Payer: Medicare Other | Source: Ambulatory Visit | Attending: Internal Medicine | Admitting: Internal Medicine

## 2013-04-06 ENCOUNTER — Encounter: Payer: Self-pay | Admitting: Adult Health

## 2013-04-06 ENCOUNTER — Encounter (INDEPENDENT_AMBULATORY_CARE_PROVIDER_SITE_OTHER): Payer: Medicare Other

## 2013-04-06 ENCOUNTER — Other Ambulatory Visit (INDEPENDENT_AMBULATORY_CARE_PROVIDER_SITE_OTHER): Payer: Medicare Other

## 2013-04-06 VITALS — BP 110/56 | HR 83 | Temp 97.0°F | Ht 67.0 in | Wt 282.6 lb

## 2013-04-06 DIAGNOSIS — R609 Edema, unspecified: Secondary | ICD-10-CM | POA: Diagnosis not present

## 2013-04-06 DIAGNOSIS — R799 Abnormal finding of blood chemistry, unspecified: Secondary | ICD-10-CM | POA: Diagnosis not present

## 2013-04-06 DIAGNOSIS — Z8572 Personal history of non-Hodgkin lymphomas: Secondary | ICD-10-CM | POA: Diagnosis not present

## 2013-04-06 DIAGNOSIS — M79609 Pain in unspecified limb: Secondary | ICD-10-CM | POA: Diagnosis not present

## 2013-04-06 DIAGNOSIS — R05 Cough: Secondary | ICD-10-CM | POA: Diagnosis not present

## 2013-04-06 DIAGNOSIS — I10 Essential (primary) hypertension: Secondary | ICD-10-CM | POA: Insufficient documentation

## 2013-04-06 DIAGNOSIS — R0989 Other specified symptoms and signs involving the circulatory and respiratory systems: Secondary | ICD-10-CM

## 2013-04-06 DIAGNOSIS — R0609 Other forms of dyspnea: Secondary | ICD-10-CM

## 2013-04-06 DIAGNOSIS — R0600 Dyspnea, unspecified: Secondary | ICD-10-CM

## 2013-04-06 DIAGNOSIS — I700 Atherosclerosis of aorta: Secondary | ICD-10-CM | POA: Insufficient documentation

## 2013-04-06 DIAGNOSIS — R0602 Shortness of breath: Secondary | ICD-10-CM | POA: Diagnosis not present

## 2013-04-06 DIAGNOSIS — R059 Cough, unspecified: Secondary | ICD-10-CM | POA: Insufficient documentation

## 2013-04-06 DIAGNOSIS — J42 Unspecified chronic bronchitis: Secondary | ICD-10-CM | POA: Diagnosis not present

## 2013-04-06 DIAGNOSIS — R7989 Other specified abnormal findings of blood chemistry: Secondary | ICD-10-CM

## 2013-04-06 LAB — BASIC METABOLIC PANEL
BUN: 16 mg/dL (ref 6–23)
CO2: 30 mEq/L (ref 19–32)
Calcium: 9.7 mg/dL (ref 8.4–10.5)
Chloride: 103 mEq/L (ref 96–112)
Creatinine, Ser: 0.8 mg/dL (ref 0.4–1.2)
GFR: 73.35 mL/min (ref >60.00)
Glucose, Bld: 106 mg/dL — ABNORMAL HIGH (ref 70–99)
Potassium: 3.3 mEq/L — ABNORMAL LOW (ref 3.5–5.1)
Sodium: 139 mEq/L (ref 135–145)

## 2013-04-06 MED ORDER — TECHNETIUM TC 99M DIETHYLENETRIAME-PENTAACETIC ACID
41.7000 | Freq: Once | INTRAVENOUS | Status: AC | PRN
  Administered 2013-04-06: 41.7 via INTRAVENOUS

## 2013-04-06 MED ORDER — TECHNETIUM TO 99M ALBUMIN AGGREGATED
5.0000 | Freq: Once | INTRAVENOUS | Status: AC | PRN
  Administered 2013-04-06: 5 via INTRAVENOUS

## 2013-04-06 NOTE — Progress Notes (Signed)
Subjective:    Patient ID: Natasha Ellis, female    DOB: Dec 05, 1930, 79 y.o.   MRN: [id number]  HPI Body mass index is 46.02 kg/(m^2).  reports that she quit smoking about 33 years ago. Her smoking use included Cigarettes. She has a 25 pack-year smoking history. She has never used smokeless tobacco.  IOV 12/14/2012  79 year old female.  CHronic cough x 11 years. Insidous onset. COugh worsenend in spring and fall. Quality - has sputum that is variable color yellow and white. Cough is mild but sometimes severe. When bad she is throwing up and gasping for breath. STable course. No associated tickle, gag, clearing of throat. THere is associatd post nasal drip but no acid reflux   Also, dyspnea. Insidious onet. Few to several months. Noticed it after she quit oxgen in sept 2013 when she moved from [place] to [place]. Was using only nocutrnal oxygen.     Cough relevnt hx  #Sinus/ENT/Allergy hx  - reports chronic post nasal drip - has seen ENT for lymphoma in neck  Many years ago but not for cough - denies hx of pollent allergies with sneezin but says she coughs inn pollen time - takes nasal steroids prn  #GI/GERD  - denies GERD - vehmently denies aspiration  #Misc - Low grage B cell diagnosed 2004. S/p chemo last 3 years ago at [place]. As of 2014: in [place]   #Pulmonary hx  - Pulmonary nodules: IN 2008 and 2009 and 2011 - small stable nodules per CT report at [facility]  - OSA - diagnosed sleep apnea . Using CPAP. But got rid of overnight oxygen in sept and an 2013 when she moved to [place] due to logistics   - hx of PFTs n [place]; does not know details  - OF note, she is on symbicort for a diagnosis of "chronic bronchitis"  #Exposure  Smoing:  reports that she quit smoking about 33 years ago. Her smoking use included Cigarettes. She has a 25 pack-year smoking history. She has never used smokeless tobacco.   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 Moved from [place] in Sept  2013     REC Sign release to get records from [facility]  For office notes, pft Have full PFT breathing test I will talk to Dr Truett Perna about getting just chest ct or chest ct with abdominal CT followup after tests    PFT 12/19/12  - Normal spiro, lung function but DLCO 56%. Will get CT chest   Telephoine call EndMay/Early June 2014:   reviewed CT scan of the chest 12/30/2012. Several abnormalities   1. she has evidence of some chronic inflammation in the lungs. I really need CD-ROM of CT scan from [place] and pulmonary notes from [place]. Please have the patient get them to me.   2. she also has calcium deposits in the blood vessels of the heart. So please refer her to cardiology. I have done an order  3. At time of followup I do need the [facility] records      Acute vsiit. 01/16/13  - Says dyspnea getting worse. Dyspnea present for years (last vist she told me months). Was stable. ASt last OV cough was dominant symptoms but last few weeks dyspnea worse and has become dominant symptoms. Now class 2-3 exertiona. RElieved with ret. No orthopnea and  No pnd. REpotedly had cardiac stress test with Dr Mendel Ryder and normal (last few weeks). No recent fever  but did have these in April  2014.  Of note, in feb 2014 she gave up PT at the [facility] home due to lack of energy. Dyspnea is associated with fatigue. In terms of fatigue: hgb 11gm% in March 2014 and started on Iron. She is on Vit D supplementation and she reports normal thyroid function  In terms of cough: still the same. She does have periodic yellow sputum just as before. Cough x 11 years. She is hesitant to have bronch because of pripor experience when she got MRSA infection in2009 after bronch per report. Denies aspiration  Walk test Walking desaturation test on6/16/14  185 feet x 3 laps:  Did NOT desaturate. Able to barely do Only 1 LAP and then stopped due to dyspnea.  Rest pulse ox was 97%, final pulse ox was 95%.  HR response 91/min at rest to 106/min at peak exertion.  EMail from Dr Jacquelyne Balint at [facility] 01/25/13   -spirometry - mostly saw her for her OSA and recurrent bronchitis;   - last seen by myself in 2012;   - was hospitalized in April 2014 for 3 days due to pneumonia and managed by her PCP following knee surgery.  CXR was clear by June 2013.  - She had normal spirometry in 2012 - do not believe we did PFTs as dyspnea was never a prominent complaint.  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  I had maintained her on symbicort as it appeared to control the episodic bronchospasm.  However, she is probably at risk for opportunistic infections.    - Last CT chest was 2011 and did not reveal any parenchymal changes except for minimal scarring of LLL.    - She  is a remote tobacco user  I spoke to her on 01/25/13  after getting info from DR Jacquelyne Balint at [facility]:  he says she  had normal scan last few years. Patient now continues to have dyspnea though handled cataract surgery well few weeks ago and is scheduled for another one tomorrow 01/2613  REC   #shortness of breath  - will test for oxygen levels at night on room air while using CPAP  #cough  - I will wait to hear from Dr Jacquelyne Balint  #Followup - keep 02/02/13 visit  - wait to hear more from me   Telephone call 01/25/13 Tke levaquin 500mg  once daily  X 5 days. I have ordered auutoimmune lab work, please have her do that     OV 02/02/2013 FU cough and dyspnea  - since antibiotic Rx cough and dyspnea 25% better but still with residual symptoms. SHe is freustrated by her symptomatology. Objectively no change in RSI cough score. Labs 01/30/13:  SEd rate 17. Autoimmune panel - negative. ONO test is still pending.    CXR today  Dg Chest 2 View  02/02/2013   *RADIOLOGY REPORT*  Clinical Data: History of chronic coughing and pulmonary infiltrates.  CHEST - 2  VIEW  Comparison: 09/26/2012.  CT 12/30/2012.  Findings: Cardiac silhouette is upper range of normal size. Ectasia and nonaneurysmal calcification of the thoracic aorta are seen. Mediastinal and hilar contours appear stable.  There is chronic elevation of the right hemidiaphragm with slight increased markings in the right medial base.  No consolidation or pleural effusion is seen.  Tiny area of vague nodularity projects over the anterior aspect of the right third rib on the PA image appears unchanged. This could reflect vessel on end or a small granuloma.  There is some central peribronchial thickening with slight increase in perihilar markings.  Flattening of the diaphragm on lateral images seen consistent L of hyperinflation.  Minimal degenerative spondylosis is present.  There is also syndesmophyte formation in the spine.  There is osteopenic appearance of bones. Haziness over costophrenic angle region on PA image most likely is secondary to superimposed soft tissues.  IMPRESSION: Flattening of diaphragm on lateral image with overall hyperinflation configuration.  Chronic elevation of right hemidiaphragm with slight chronic increased markings in right medial base.  Stable tiny area of nodularity over anterior aspect of right third rib.  Central peribronchial thickening.  Slight increased perihilar markings.  No consolidation or pleural effusion is seen.   Original Report Authenticated By: Onalee Hua Call    REC Please have CXR today; will call next week with results Depending on cxr results have to time next scan of chest Sign release to get records from Dr Roselee Nova Fax; [phone] [facility], try sample Pulmicort 1 puff twice daily for your cough Will await ONO results [place] depending on xray results    OV 03/30/2013  She still complaining of persistent dyspnea. is worse. Dyspnea is class III. Walking from walkin 10 feet  brings on dyspnea. She had CT scan of the chest today and this shows  significant clearing of her infiltrates compared to May 2014. She's really suppressed with this result because her dyspnea is worse. She's having chronic cough without any change. She's having mild low sputum that is without any change. Since last visit she has registered with Dr. Earl Gala sleep specialist and oxygen has been added nocturnal CPAP. She is due to start her oxygen tomorrow. She is full of her sense of doom and gloom because of worsening dyspnea. Denies any associated chest pain or fever. No change in chronic venous stasis edema. Denies any hemoptysis or pleuritic chest pain >>rx lasix 20mg  daily , VQ scan ordered and BNP was nml   04/06/2013 Follow up  Returns for 1 week follow up . Seen last ov with persistent DOE. Started on Lasix 20mg  daily . Labs revealed nml BNP. D Dimer was slightly elevated.  Weight is down 2 lbs with less leg swelling . But no change in dyspnea level. Has DOE with minimal actiivty   She has a VQ scan and doppler set up today.  No n/v/, fever, increased cough, chest pain .  Remains on symbicort Twice daily  .     Review of Systems  Constitutional: Negative for fever and unexpected weight change.  HENT: Negative for ear pain, nosebleeds, congestion, sore throat, rhinorrhea, sneezing, trouble swallowing, dental problem, postnasal drip and sinus pressure.   Eyes: Negative for redness and itching.  Respiratory: Positive for cough and shortness of breath. Negative for chest tightness and wheezing.   Cardiovascular: Positive for leg swelling. Negative for palpitations.  Gastrointestinal: Negative for nausea and vomiting.  Genitourinary: Negative for dysuria.  Musculoskeletal: Negative for joint swelling.  Skin: Negative for rash.  Neurological: Negative for headaches.  Hematological: Does not bruise/bleed easily.  Psychiatric/Behavioral: Negative for dysphoric mood. The patient is not nervous/anxious.        Objective:   Physical Exam  Vitals  reviewed. Constitutional: She is oriented to person, place, and time. She appears well-developed and obese  No distress.  HENT:  Head: Normocephalic and atraumatic.  Right Ear: External ear normal.  Left Ear: External ear normal.  Mouth/Throat: Oropharynx is clear and moist. No oropharyngeal exudate.  Eyes: Conjunctivae and EOM  are normal. Pupils are equal, round, and reactive to light. Right eye exhibits no discharge. Left eye exhibits no discharge. No scleral icterus.  Neck: Normal range of motion. Neck supple. No JVD present. No tracheal deviation present. No thyromegaly present.  Cardiovascular: Normal rate, regular rhythm, normal heart sounds and intact distal pulses.  Exam reveals no gallop and no friction rub.   No murmur heard. Pulmonary/Chest: Effort normal and breath sounds normal. No respiratory distress. She has no wheezes. She has no rales. She exhibits no tenderness.  Abdominal: Soft. Bowel sounds are normal. She exhibits no distension and no mass. There is no tenderness. There is no rebound and no guarding.  Musculoskeletal: Normal range of motion. She exhibits no edema and no tenderness.  Chronic venous stasis edema Negative homan  Lymphadenopathy:    She has no cervical adenopathy.  Neurological: She is alert and oriented to person, place, and time. She has normal reflexes. No cranial nerve deficit. She exhibits normal muscle tone. Coordination normal.  Skin: Skin is warm and dry. No rash noted. She is not diaphoretic. No erythema. No pallor.  Psychiatric: She has a normal mood and affect. Her behavior is normal. Judgment and thought content normal.          Assessment & Plan:

## 2013-04-06 NOTE — Patient Instructions (Addendum)
Continue on current regimen .  Follow up for VQ scan and doppler today as planned  I will call with lab results.  follow up Dr. Marchelle Gearing in 4-6 weeks and As needed

## 2013-04-06 NOTE — Assessment & Plan Note (Signed)
Undergoing workup today with VQ scan , CXR and doppler.  BNP nml .  No significant change with diuresis   Plan  Follow up for VQ, doppler and cxr today as planned  Cont on current regimen  Check bmet today .

## 2013-04-19 ENCOUNTER — Telehealth: Payer: Self-pay | Admitting: Internal Medicine

## 2013-04-19 NOTE — Telephone Encounter (Signed)
Natasha Ellis  Let her know VQ scan 04/07/13 is very low prob for PE which means highly unlikely PE is cause of dyspnea. So, I still do not know why she is dyspneic. She told me she had echo and stress test at [facility] cards by Dr Katrinka Blazing; could you please double check on that and get me Dr Katrinka Blazing latest notes and echo and stress test report  Thanks  Dr. Kalman Shan, M.D., [facility].C.P Pulmonary and Critical Care Medicine Staff Physician [facility] Pager: [phone], If no answer or between  15:00h - 7:00h: call [phone]  04/19/2013 5:27 AM

## 2013-04-20 ENCOUNTER — Encounter (HOSPITAL_COMMUNITY): Payer: Self-pay

## 2013-04-20 ENCOUNTER — Emergency Department (HOSPITAL_COMMUNITY)
Admission: EM | Admit: 2013-04-20 | Discharge: 2013-04-20 | Disposition: A | Discharge status: Home / Self Care | Payer: Medicare Other | Attending: Emergency Medicine | Admitting: Emergency Medicine

## 2013-04-20 ENCOUNTER — Ambulatory Visit (INDEPENDENT_AMBULATORY_CARE_PROVIDER_SITE_OTHER): Payer: Medicare Other | Admitting: Emergency Medicine

## 2013-04-20 ENCOUNTER — Emergency Department (HOSPITAL_COMMUNITY): Payer: Medicare Other

## 2013-04-20 VITALS — BP 170/100 | HR 89 | Temp 97.3°F | Resp 24 | Ht 66.5 in | Wt 282.0 lb

## 2013-04-20 DIAGNOSIS — Z7982 Long term (current) use of aspirin: Secondary | ICD-10-CM | POA: Insufficient documentation

## 2013-04-20 DIAGNOSIS — Z8669 Personal history of other diseases of the nervous system and sense organs: Secondary | ICD-10-CM | POA: Insufficient documentation

## 2013-04-20 DIAGNOSIS — R350 Frequency of micturition: Secondary | ICD-10-CM

## 2013-04-20 DIAGNOSIS — M129 Arthropathy, unspecified: Secondary | ICD-10-CM | POA: Insufficient documentation

## 2013-04-20 DIAGNOSIS — M545 Low back pain, unspecified: Secondary | ICD-10-CM | POA: Diagnosis not present

## 2013-04-20 DIAGNOSIS — M549 Dorsalgia, unspecified: Secondary | ICD-10-CM | POA: Diagnosis not present

## 2013-04-20 DIAGNOSIS — Z87898 Personal history of other specified conditions: Secondary | ICD-10-CM | POA: Diagnosis not present

## 2013-04-20 DIAGNOSIS — Z8639 Personal history of other endocrine, nutritional and metabolic disease: Secondary | ICD-10-CM | POA: Insufficient documentation

## 2013-04-20 DIAGNOSIS — Z87891 Personal history of nicotine dependence: Secondary | ICD-10-CM | POA: Insufficient documentation

## 2013-04-20 DIAGNOSIS — I10 Essential (primary) hypertension: Secondary | ICD-10-CM | POA: Insufficient documentation

## 2013-04-20 DIAGNOSIS — R6889 Other general symptoms and signs: Secondary | ICD-10-CM | POA: Diagnosis not present

## 2013-04-20 DIAGNOSIS — R0602 Shortness of breath: Secondary | ICD-10-CM | POA: Diagnosis not present

## 2013-04-20 DIAGNOSIS — IMO0001 Reserved for inherently not codable concepts without codable children: Secondary | ICD-10-CM | POA: Diagnosis not present

## 2013-04-20 DIAGNOSIS — Z862 Personal history of diseases of the blood and blood-forming organs and certain disorders involving the immune mechanism: Secondary | ICD-10-CM | POA: Insufficient documentation

## 2013-04-20 DIAGNOSIS — M2550 Pain in unspecified joint: Secondary | ICD-10-CM | POA: Diagnosis not present

## 2013-04-20 DIAGNOSIS — K5730 Diverticulosis of large intestine without perforation or abscess without bleeding: Secondary | ICD-10-CM | POA: Diagnosis not present

## 2013-04-20 DIAGNOSIS — K589 Irritable bowel syndrome without diarrhea: Secondary | ICD-10-CM | POA: Insufficient documentation

## 2013-04-20 DIAGNOSIS — R112 Nausea with vomiting, unspecified: Secondary | ICD-10-CM | POA: Diagnosis not present

## 2013-04-20 DIAGNOSIS — K8020 Calculus of gallbladder without cholecystitis without obstruction: Secondary | ICD-10-CM | POA: Diagnosis not present

## 2013-04-20 DIAGNOSIS — Z79899 Other long term (current) drug therapy: Secondary | ICD-10-CM | POA: Insufficient documentation

## 2013-04-20 DIAGNOSIS — J45901 Unspecified asthma with (acute) exacerbation: Secondary | ICD-10-CM | POA: Insufficient documentation

## 2013-04-20 LAB — COMPREHENSIVE METABOLIC PANEL
ALT: 17 U/L (ref 0–35)
AST: 15 U/L (ref 0–37)
Albumin: 3.5 g/dL (ref 3.5–5.2)
Alkaline Phosphatase: 75 U/L (ref 39–117)
BUN: 14 mg/dL (ref 6–23)
CO2: 25 mEq/L (ref 19–32)
Calcium: 9.6 mg/dL (ref 8.4–10.5)
Chloride: 100 mEq/L (ref 96–112)
Creatinine, Ser: 0.71 mg/dL (ref 0.50–1.10)
GFR calc Af Amer: >90 mL/min (ref >90)
GFR calc non Af Amer: 79 mL/min — ABNORMAL LOW (ref >90)
Glucose, Bld: 110 mg/dL — ABNORMAL HIGH (ref 70–99)
Potassium: 3.8 mEq/L (ref 3.5–5.1)
Sodium: 135 mEq/L (ref 135–145)
Total Bilirubin: 0.9 mg/dL (ref 0.3–1.2)
Total Protein: 8.6 g/dL — ABNORMAL HIGH (ref 6.0–8.3)

## 2013-04-20 LAB — POCT UA - MICROSCOPIC ONLY
Casts, Ur, LPF, POC: NEGATIVE
Crystals, Ur, HPF, POC: NEGATIVE
Mucus, UA: NEGATIVE
Yeast, UA: NEGATIVE

## 2013-04-20 LAB — POCT URINALYSIS DIPSTICK
Bilirubin, UA: NEGATIVE
Blood, UA: NEGATIVE
Glucose, UA: NEGATIVE
Ketones, UA: NEGATIVE
Leukocytes, UA: NEGATIVE
Nitrite, UA: NEGATIVE
Protein, UA: 100
Spec Grav, UA: >=1.03
Urobilinogen, UA: 0.2
pH, UA: 6.5

## 2013-04-20 LAB — LIPASE, BLOOD: Lipase: 25 U/L (ref 11–59)

## 2013-04-20 LAB — LACTIC ACID, PLASMA: Lactic Acid, Venous: 0.8 mmol/L (ref 0.5–2.2)

## 2013-04-20 MED ORDER — MORPHINE SULFATE 4 MG/ML IJ SOLN
4.0000 mg | Freq: Once | INTRAMUSCULAR | Status: AC
  Administered 2013-04-20: 4 mg via INTRAVENOUS
  Filled 2013-04-20: qty 1

## 2013-04-20 MED ORDER — ONDANSETRON HCL 4 MG/2ML IJ SOLN
4.0000 mg | Freq: Once | INTRAMUSCULAR | Status: AC
  Administered 2013-04-20: 4 mg via INTRAVENOUS
  Filled 2013-04-20: qty 2

## 2013-04-20 MED ORDER — SODIUM CHLORIDE 0.9 % IV BOLUS (SEPSIS)
1000.0000 mL | Freq: Once | INTRAVENOUS | Status: AC
  Administered 2013-04-20: 1000 mL via INTRAVENOUS

## 2013-04-20 MED ORDER — OXYCODONE-ACETAMINOPHEN 5-325 MG PO TABS: 2.0000 | ORAL_TABLET | Freq: Four times a day (QID) | ORAL | Status: DC | PRN

## 2013-04-20 MED ORDER — DIAZEPAM 5 MG PO TABS: 5.0000 mg | ORAL_TABLET | Freq: Two times a day (BID) | ORAL | Status: DC

## 2013-04-20 MED ORDER — HYDROMORPHONE HCL PF 1 MG/ML IJ SOLN
0.5000 mg | Freq: Once | INTRAMUSCULAR | Status: AC
  Administered 2013-04-20: 0.5 mg via INTRAVENOUS
  Filled 2013-04-20: qty 1

## 2013-04-20 MED ORDER — DIAZEPAM 5 MG PO TABS
5.0000 mg | ORAL_TABLET | Freq: Once | ORAL | Status: AC
  Administered 2013-04-20: 5 mg via ORAL
  Filled 2013-04-20: qty 1

## 2013-04-20 MED ORDER — ONDANSETRON HCL 4 MG PO TABS: 4.0000 mg | ORAL_TABLET | Freq: Four times a day (QID) | ORAL | Status: DC

## 2013-04-20 NOTE — Telephone Encounter (Signed)
I spoke with the pt and advised of results. She states she had some testing done with Dr. Katrinka Blazing but was not sure on exactly what it was so I will call and inquire. Carron Curie, CMA  I called and requested records. Will await fax. Carron Curie, CMA

## 2013-04-20 NOTE — Progress Notes (Signed)
[facility] [address] [place] [address] [phone]  Date:  04/20/2013   Name:  Natasha Ellis   DOB:  04/17/1931   MRN:  [id number]  PCP:  Ginette Otto, MD    Chief Complaint: Urinary Frequency and Back Pain   History of Present Illness:  Natasha Ellis is a 79 y.o. very pleasant female patient who presents with the following:  Sudden onset of low back pain while seated at her computer.  Pain is not radiating and is quite severe.  No history of injury or overuse.  No associated neuro symptoms.  No history of prior back pain. Describes the pain as sharp and located in the l5 region.  Has increased shortness of breath.  Nauseated and vomited in the waiting room.  Poor po intake today.  Had nocturia last night which is very unusual for her.  She has no nausea or vomiting.  No history of AAA.  Non smoker.  No history of kidney stones.  No hematuria.  Pain is not lateralized.  No improvement with over the counter medications or other home remedies. Denies other complaint or health concern today.   Patient Active Problem List   Diagnosis Date Noted  . Dyspnea 01/25/2013  . Chronic cough 01/01/2013    Past Medical History  Diagnosis Date  . Malignant lymphoplasmacytic lymphoma 2004  . Hypertension   . Arthritis   . Hypercholesteremia   . IBS (irritable bowel syndrome)   . Diverticulosis of intestine     H/O  . DJD (degenerative joint disease) of lumbar spine   . Left knee DJD   . Right knee DJD   . Chronic cough   . Asthma   . Cataract     Past Surgical History  Procedure Laterality Date  . Tonsillectomy and adenoidectomy  childhood  . Abdominal hysterectomy  1984    TAH    History  Substance Use Topics  . Smoking status: Former Smoker -- 1.00 packs/day for 25 years    Types: Cigarettes    Quit date: 08/04/1979  . Smokeless tobacco: Never Used  . Alcohol Use: No    History reviewed. No pertinent family history.  Allergies   Allergen Reactions  . Codeine Other (See Comments)    Crazy-nightmares  . Erythromycin Other (See Comments)    GI upset  . Lisinopril Cough  . Oxybutynin   . Sulfa Antibiotics Other (See Comments)    GI upset    Medication list has been reviewed and updated.  Current Outpatient Prescriptions on File Prior to Visit  Medication Sig Dispense Refill  . aspirin 81 MG tablet Take 81 mg by mouth daily.      . budesonide-formoterol (SYMBICORT) 80-4.5 MCG/ACT inhaler Inhale 2 puffs into the lungs 2 (two) times daily.      . cholecalciferol (VITAMIN D) 1000 UNITS tablet Take 1,000 Units by mouth 3 (three) times daily.      . cholestyramine (QUESTRAN) 4 GM/DOSE powder Take 4 g by mouth daily as needed.       . cyanocobalamin 1000 MCG tablet Take 100 mcg by mouth daily.      . ferrous sulfate 325 (65 FE) MG tablet Take 325 mg by mouth daily with breakfast.      . fluticasone (FLONASE) 50 MCG/ACT nasal spray Place 1 spray into the nose 2 (two) times daily as needed. Each nostril      . hydrochlorothiazide (HYDRODIURIL) 25 MG tablet Take  25 mg by mouth daily.      . Multiple Vitamin (MULTIVITAMIN) capsule Take 1 capsule by mouth daily. With Vitamin D 4000      . potassium chloride (K-DUR) 10 MEQ tablet Take 10 mEq by mouth daily.      . furosemide (LASIX) 20 MG tablet Take 1 tablet (20 mg total) by mouth daily.  30 tablet  3   No current facility-administered medications on file prior to visit.    Review of Systems:  As per HPI, otherwise negative.    Physical Examination: Filed Vitals:   04/20/13 1717  BP: 170/100  Pulse: 89  Temp: 97.3 F (36.3 C)  Resp: 24   Filed Vitals:   04/20/13 1717  Height: 5' 6.5" (1.689 m)  Weight: 282 lb (127.914 kg)   Body mass index is 44.84 kg/(m^2). Ideal Body Weight: Weight in (lb) to have BMI = 25: 156.9  GEN: very obese, restless, marked distress.  SOB laying down. Non-toxic, A & O x 3 HEENT: Atraumatic, Normocephalic. Neck supple. No  masses, No LAD. Ears and Nose: No external deformity. CV: RRR, No M/G/R. No JVD. No thrill. No extra heart sounds. PULM: CTA B, no wheezes, crackles, rhonchi. No retractions. No resp. distress. No accessory muscle use. ABD: S, NT, ND, +BS. No rebound. No HSM. EXTR: No c/c/e NEURO Normal gait.  PSYCH: Normally interactive. Conversant. Not depressed or anxious appearing.  Calm demeanor.    Assessment and Plan: Back pain Consider kidney stone, AAA. Too restless and uncooperative to evaluate  To ER.   Signed,  Phillips Odor, MD   Results for orders placed in visit on 04/20/13  POCT URINALYSIS DIPSTICK      Result Value Range   Color, UA yellow     Clarity, UA clear     Glucose, UA neg     Bilirubin, UA neg     Ketones, UA neg     Spec Grav, UA >=1.030     Blood, UA neg     pH, UA 6.5     Protein, UA 100     Urobilinogen, UA 0.2     Nitrite, UA neg     Leukocytes, UA Negative    POCT UA - MICROSCOPIC ONLY      Result Value Range   WBC, Ur, HPF, POC 1-4     RBC, urine, microscopic 2-5     Bacteria, U Microscopic trace     Mucus, UA neg     Epithelial cells, urine per micros 2-6     Crystals, Ur, HPF, POC neg     Casts, Ur, LPF, POC neg     Yeast, UA neg

## 2013-04-20 NOTE — ED Notes (Signed)
Bed: GN56 Expected date: 04/20/13 Expected time: 6:44 PM Means of arrival: Ambulance Comments: Low back pain/ hypertension

## 2013-04-20 NOTE — ED Provider Notes (Signed)
CSN: [id number]     Arrival date & time 04/20/13  1855 History   First MD Initiated Contact with Patient 04/20/13 1909     Chief Complaint  Patient presents with  . Back Pain  . increased urinary frequency    (Consider location/radiation/quality/duration/timing/severity/associated sxs/prior Treatment) HPI Comments: Patient is an 79 year old female history of hypertension, arthritis, hypercholesterolemia, IBS, diverticulosis, degenerative joint disease, asthma, and cataracts presents today with 24 hours of low back pain. She describes the pain as severe, but cannot give quality to the pain. She has tried many different positions, none of which relieve her pain at all. She admits to urinary frequency stating that she got up to urinate 9 times last night. She denies dysuria or urinary urgency. She has no history of AAA. She does not smoke. Per EMS she was hypertensive at 220/120, but in the emergency department her blood pressure was 184/94. She denies headache or weakness. She reports that she feels like she has been getting increasingly more short of breath over the past 3 months.   Patient is a 79 y.o. female presenting with back pain. The history is provided by the patient. No language interpreter was used.  Back Pain Associated symptoms: no abdominal pain and no fever     Past Medical History  Diagnosis Date  . Malignant lymphoplasmacytic lymphoma 2004  . Hypertension   . Arthritis   . Hypercholesteremia   . IBS (irritable bowel syndrome)   . Diverticulosis of intestine     H/O  . DJD (degenerative joint disease) of lumbar spine   . Left knee DJD   . Right knee DJD   . Chronic cough   . Asthma   . Cataract    Past Surgical History  Procedure Laterality Date  . Tonsillectomy and adenoidectomy  childhood  . Abdominal hysterectomy  1984    TAH   History reviewed. No pertinent family history. History  Substance Use Topics  . Smoking status: Former Smoker -- 1.00 packs/day for  25 years    Types: Cigarettes    Quit date: 08/04/1979  . Smokeless tobacco: Never Used  . Alcohol Use: No   OB History   Grav Para Term Preterm Abortions TAB SAB Ect Mult Living                 Review of Systems  Constitutional: Negative for fever and chills.  Respiratory: Positive for shortness of breath.   Gastrointestinal: Positive for nausea and vomiting. Negative for abdominal pain.  Genitourinary: Positive for frequency.  Musculoskeletal: Positive for myalgias, back pain and arthralgias.  All other systems reviewed and are negative.    Allergies  Codeine; Erythromycin; Lisinopril; Oxybutynin; and Sulfa antibiotics  Home Medications   Current Outpatient Rx  Name  Route  Sig  Dispense  Refill  . aspirin 81 MG tablet   Oral   Take 81 mg by mouth daily.         . budesonide-formoterol (SYMBICORT) 80-4.5 MCG/ACT inhaler   Inhalation   Inhale 2 puffs into the lungs 2 (two) times daily.         . cholecalciferol (VITAMIN D) 1000 UNITS tablet   Oral   Take 1,000 Units by mouth 3 (three) times daily.         . cholestyramine (QUESTRAN) 4 GM/DOSE powder   Oral   Take 4 g by mouth daily as needed.          . cyanocobalamin 1000 MCG tablet  Oral   Take 100 mcg by mouth daily.         . ferrous sulfate 325 (65 FE) MG tablet   Oral   Take 325 mg by mouth daily with breakfast.         . fluticasone (FLONASE) 50 MCG/ACT nasal spray   Nasal   Place 1 spray into the nose 2 (two) times daily as needed. Each nostril         . furosemide (LASIX) 20 MG tablet   Oral   Take 1 tablet (20 mg total) by mouth daily.   30 tablet   3   . hydrochlorothiazide (HYDRODIURIL) 25 MG tablet   Oral   Take 25 mg by mouth daily.         . Multiple Vitamin (MULTIVITAMIN) capsule   Oral   Take 1 capsule by mouth daily. With Vitamin D 4000         . potassium chloride (K-DUR) 10 MEQ tablet   Oral   Take 10 mEq by mouth daily.          BP 147/66  Pulse 72   Temp(Src) 98.1 F (36.7 C) (Oral)  Resp 16  SpO2 93% Physical Exam  Nursing note and vitals reviewed. Constitutional: She is oriented to person, place, and time. She appears well-developed and well-nourished. She appears distressed.  HENT:  Head: Normocephalic and atraumatic.  Right Ear: External ear normal.  Left Ear: External ear normal.  Nose: Nose normal.  Mouth/Throat: Oropharynx is clear and moist.  Eyes: Conjunctivae are normal.  Neck: Normal range of motion.  Cardiovascular: Normal rate, regular rhythm, normal heart sounds, intact distal pulses and normal pulses.   Pulses:      Dorsalis pedis pulses are 2+ on the right side, and 2+ on the left side.  Pulmonary/Chest: Effort normal and breath sounds normal. No stridor. No respiratory distress. She has no wheezes. She has no rales.  Abdominal: Soft. Bowel sounds are normal. She exhibits no distension. There is no tenderness. There is no rebound and no guarding.  No pulsatile mass  Musculoskeletal: Normal range of motion.  Tender to palpation along the lumbar spine. No deformity, step-off.  Neurological: She is alert and oriented to person, place, and time. She has normal strength.  Skin: Skin is warm and dry. She is not diaphoretic. No erythema.  Psychiatric: She has a normal mood and affect. Her behavior is normal.    ED Course  Procedures (including critical care time) Labs Review Labs Reviewed  CBC - Abnormal; Notable for the following:    RBC 3.08 (*)    Hemoglobin 11.0 (*)    HCT 29.4 (*)    MCH 35.7 (*)    RDW 23.0 (*)    All other components within normal limits  COMPREHENSIVE METABOLIC PANEL - Abnormal; Notable for the following:    Glucose, Bld 110 (*)    Total Protein 8.6 (*)    GFR calc non Af Amer 79 (*)    All other components within normal limits  LACTIC ACID, PLASMA  LIPASE, BLOOD   Imaging Review Ct Abdomen Pelvis Wo Contrast  04/20/2013   CLINICAL DATA:  Sudden onset of low back pain.  Increased urinary frequency. History of lymphoma.  EXAM: CT ABDOMEN AND PELVIS WITHOUT CONTRAST  TECHNIQUE: Multidetector CT imaging of the abdomen and pelvis was performed following the standard protocol without intravenous contrast.  COMPARISON:  No priors.  FINDINGS: Lung Bases: Extensive bronchial wall thickening throughout  the lower lobes of the lungs bilaterally and the inferior segment of the lingula, where there is some thickening of the peribronchovascular interstitium. Atherosclerotic calcifications within the left main, left anterior descending, left circumflex and right coronary arteries.  Abdomen/Pelvis: Calcified gallstones lying dependently within the gallbladder. No findings to suggest acute cholecystitis at this time. The unenhanced appearance of the liver, pancreas, spleen, right adrenal gland and bilateral kidneys is unremarkable. Within the left adrenal gland there is a 2.0 cm fatty attenuation lesion compatible with an adrenal myelolipoma, similar compared to priors. No abnormal calcifications within the collecting system of either kidney, along the course of either ureter, or within the lumen of the urinary bladder to suggest urinary tract calculi at this time. No hydroureteronephrosis or perinephric stranding to suggest urinary tract obstruction at this time.  Numerous colonic diverticulae noted, most pronounced in the region of the proximal sigmoid colon, without surrounding inflammatory changes to suggest an acute diverticulitis at this time. No significant volume of ascites. No pneumoperitoneum. No pathologic distention of small bowel. No definite pathologic lymphadenopathy identified within the abdomen or pelvis. Status post hysterectomy. Ovaries are not confidently identified may be surgically absent or atrophic.  Musculoskeletal: There are no aggressive appearing lytic or blastic lesions noted in the visualized portions of the skeleton.  IMPRESSION: 1. No acute findings in the abdomen or  pelvis to account for the patient's symptoms. 2. Colonic diverticulosis without evidence to suggest acute diverticulitis at this time. 3. Cholelithiasis without evidence to suggest acute cholecystitis at this time. 4. Extensive atherosclerosis, including left main and 3 vessel coronary artery disease. 5. Probable scarring throughout the lung bases bilaterally related to prior infections. 6. 2.1 cm fatty attenuation left adrenal nodule compatible with a myelolipoma. 7. Additional incidental findings, as above.   Electronically Signed   By: Trudie Reed M.D.   On: 04/20/2013 23:06    MDM   1. Back pain    Patient an 79 year old female who presents with back pain. She was sent to the emergency department from urgent care to rule out aneurysm and kidney stone. A UA was done at urgent care which showed no infection. Labs today are unremarkable. CT of the abdomen and pelvis showed diverticulosis without evidence of diverticulitis, cholelithiasis without evidence of cholecystitis, extensive atherosclerosis, probable scarring throughout the lung bases, and 2.1 cm fatty attenuation left adrenal nodule. These results were discussed with the patient. There is no evidence of aneurysm or dissection. No evidence of stone. No compression fracture. The patient responded well to 0.5 mg of Dilaudid in the emergency department. She will be discharged home with pain medicine instructed followup with her primary care physician. Return instructions given. Vital signs stable for discharge. Dr. Jeraldine Loots evaluated this patient and agrees with plan. Patient / Family / Caregiver informed of clinical course, understand medical decision-making process, and agree with plan.     Mora Bellman, PA-C 04/21/13 0001

## 2013-04-20 NOTE — ED Notes (Signed)
Per EMS, Pt c/o sudden low back pain x 1 day and increased urinary frequency.  Pain score 8/10.  EMS sts Pt was "sent from an Urgent Care to be assessed for kidney stones or an aneurysm."  Pt is hypertensive at 220/130.  A & Ox4.

## 2013-04-21 LAB — CBC
HCT: 29.4 % — ABNORMAL LOW (ref 36.0–46.0)
Hemoglobin: 11 g/dL — ABNORMAL LOW (ref 12.0–15.0)
MCH: 35.7 pg — ABNORMAL HIGH (ref 26.0–34.0)
MCHC: 34.6 g/dL (ref 30.0–36.0)
MCV: 95.5 fL (ref 78.0–100.0)
Platelets: 211 10*3/uL (ref 150–400)
RBC: 3.08 MIL/uL — ABNORMAL LOW (ref 3.87–5.11)
RDW: 23 % — ABNORMAL HIGH (ref 11.5–15.5)
WBC: 6.6 10*3/uL (ref 4.0–10.5)

## 2013-04-21 NOTE — Telephone Encounter (Signed)
Results received and placed on MR desk to review.  Carron Curie, CMA

## 2013-04-24 NOTE — ED Provider Notes (Signed)
  This was a shared visit with a mid-level provided (NP or PA).  Throughout the patient's course I was available for consultation/collaboration.  I saw the ECG (if appropriate), relevant labs and studies - I agree with the interpretation.  On my exam the patient was in no distress.  She was, however, quite uncomfortable appearing.  With her Abdominal and back pain, CT     was performed.  This was reassuring, and the patient had substantial improvement in her condition with analgesics, fluids, and was discharged in stable condition.     Gerhard Munch, MD 04/24/13 [phone]

## 2013-04-26 NOTE — Telephone Encounter (Signed)
Reviewed echo 06/27/12 - shows LVH and grade 2 diastolic dysfunction with LVEF at 65%  Stress test 01/13/13 - post stress EF 77%. PErfusion defect c/w breast artifact. Otherwise normal  THerefore, I think she is dyspneic due to obesity, decodnitioning and diastolic dysfunction. Have her come into discuss   Dr. Kalman Shan, M.D., [facility].C.P Pulmonary and Critical Care Medicine Staff Physician [facility] Pager: [phone], If no answer or between  15:00h - 7:00h: call [phone]  04/26/2013 4:48 PM

## 2013-04-27 ENCOUNTER — Other Ambulatory Visit (HOSPITAL_COMMUNITY): Payer: Self-pay | Admitting: Geriatric Medicine

## 2013-04-27 ENCOUNTER — Other Ambulatory Visit: Payer: Medicare Other | Admitting: Lab

## 2013-04-27 ENCOUNTER — Ambulatory Visit: Payer: Medicare Other | Admitting: Oncology

## 2013-04-27 DIAGNOSIS — M545 Low back pain, unspecified: Secondary | ICD-10-CM

## 2013-04-27 DIAGNOSIS — M5416 Radiculopathy, lumbar region: Secondary | ICD-10-CM

## 2013-04-27 DIAGNOSIS — M549 Dorsalgia, unspecified: Secondary | ICD-10-CM | POA: Diagnosis not present

## 2013-04-27 DIAGNOSIS — K5900 Constipation, unspecified: Secondary | ICD-10-CM | POA: Diagnosis not present

## 2013-04-28 ENCOUNTER — Emergency Department (HOSPITAL_COMMUNITY): Payer: Medicare Other

## 2013-04-28 ENCOUNTER — Ambulatory Visit (HOSPITAL_COMMUNITY)
Admission: RE | Admit: 2013-04-28 | Discharge: 2013-04-28 | Disposition: A | Discharge status: Home / Self Care | Payer: Medicare Other | Source: Ambulatory Visit | Attending: Geriatric Medicine | Admitting: Geriatric Medicine

## 2013-04-28 ENCOUNTER — Other Ambulatory Visit: Payer: Self-pay

## 2013-04-28 ENCOUNTER — Encounter (HOSPITAL_COMMUNITY): Payer: Self-pay | Admitting: *Deleted

## 2013-04-28 ENCOUNTER — Observation Stay (HOSPITAL_COMMUNITY)
Admission: EM | Admit: 2013-04-28 | Discharge: 2013-05-01 | Payer: Medicare Other | Attending: Internal Medicine | Admitting: Internal Medicine

## 2013-04-28 DIAGNOSIS — Z9989 Dependence on other enabling machines and devices: Secondary | ICD-10-CM

## 2013-04-28 DIAGNOSIS — K5730 Diverticulosis of large intestine without perforation or abscess without bleeding: Secondary | ICD-10-CM | POA: Insufficient documentation

## 2013-04-28 DIAGNOSIS — R0602 Shortness of breath: Secondary | ICD-10-CM | POA: Diagnosis not present

## 2013-04-28 DIAGNOSIS — M5416 Radiculopathy, lumbar region: Secondary | ICD-10-CM

## 2013-04-28 DIAGNOSIS — R05 Cough: Secondary | ICD-10-CM | POA: Diagnosis not present

## 2013-04-28 DIAGNOSIS — Z8673 Personal history of transient ischemic attack (TIA), and cerebral infarction without residual deficits: Secondary | ICD-10-CM | POA: Insufficient documentation

## 2013-04-28 DIAGNOSIS — Z90710 Acquired absence of both cervix and uterus: Secondary | ICD-10-CM | POA: Insufficient documentation

## 2013-04-28 DIAGNOSIS — G4733 Obstructive sleep apnea (adult) (pediatric): Secondary | ICD-10-CM

## 2013-04-28 DIAGNOSIS — I2510 Atherosclerotic heart disease of native coronary artery without angina pectoris: Secondary | ICD-10-CM | POA: Insufficient documentation

## 2013-04-28 DIAGNOSIS — R059 Cough, unspecified: Secondary | ICD-10-CM | POA: Diagnosis not present

## 2013-04-28 DIAGNOSIS — M545 Low back pain, unspecified: Secondary | ICD-10-CM | POA: Insufficient documentation

## 2013-04-28 DIAGNOSIS — I771 Stricture of artery: Secondary | ICD-10-CM | POA: Insufficient documentation

## 2013-04-28 DIAGNOSIS — Z87891 Personal history of nicotine dependence: Secondary | ICD-10-CM | POA: Diagnosis not present

## 2013-04-28 DIAGNOSIS — M549 Dorsalgia, unspecified: Secondary | ICD-10-CM | POA: Diagnosis not present

## 2013-04-28 DIAGNOSIS — G589 Mononeuropathy, unspecified: Secondary | ICD-10-CM | POA: Insufficient documentation

## 2013-04-28 DIAGNOSIS — E785 Hyperlipidemia, unspecified: Secondary | ICD-10-CM | POA: Diagnosis not present

## 2013-04-28 DIAGNOSIS — M5380 Other specified dorsopathies, site unspecified: Secondary | ICD-10-CM | POA: Insufficient documentation

## 2013-04-28 DIAGNOSIS — R4182 Altered mental status, unspecified: Secondary | ICD-10-CM

## 2013-04-28 DIAGNOSIS — Q762 Congenital spondylolisthesis: Secondary | ICD-10-CM | POA: Insufficient documentation

## 2013-04-28 DIAGNOSIS — Z87898 Personal history of other specified conditions: Secondary | ICD-10-CM | POA: Diagnosis not present

## 2013-04-28 DIAGNOSIS — M47817 Spondylosis without myelopathy or radiculopathy, lumbosacral region: Secondary | ICD-10-CM | POA: Diagnosis not present

## 2013-04-28 DIAGNOSIS — I10 Essential (primary) hypertension: Secondary | ICD-10-CM | POA: Diagnosis not present

## 2013-04-28 DIAGNOSIS — J45909 Unspecified asthma, uncomplicated: Secondary | ICD-10-CM | POA: Insufficient documentation

## 2013-04-28 DIAGNOSIS — K219 Gastro-esophageal reflux disease without esophagitis: Secondary | ICD-10-CM | POA: Insufficient documentation

## 2013-04-28 DIAGNOSIS — G459 Transient cerebral ischemic attack, unspecified: Principal | ICD-10-CM | POA: Insufficient documentation

## 2013-04-28 DIAGNOSIS — I672 Cerebral atherosclerosis: Secondary | ICD-10-CM | POA: Insufficient documentation

## 2013-04-28 DIAGNOSIS — Z7982 Long term (current) use of aspirin: Secondary | ICD-10-CM | POA: Insufficient documentation

## 2013-04-28 DIAGNOSIS — G319 Degenerative disease of nervous system, unspecified: Secondary | ICD-10-CM | POA: Insufficient documentation

## 2013-04-28 DIAGNOSIS — M48061 Spinal stenosis, lumbar region without neurogenic claudication: Secondary | ICD-10-CM | POA: Diagnosis not present

## 2013-04-28 DIAGNOSIS — I6381 Other cerebral infarction due to occlusion or stenosis of small artery: Secondary | ICD-10-CM | POA: Diagnosis present

## 2013-04-28 LAB — COMPREHENSIVE METABOLIC PANEL
ALT: 29 U/L (ref 0–35)
AST: 38 U/L — ABNORMAL HIGH (ref 0–37)
Albumin: 3.5 g/dL (ref 3.5–5.2)
Alkaline Phosphatase: 72 U/L (ref 39–117)
BUN: 27 mg/dL — ABNORMAL HIGH (ref 6–23)
CO2: 25 mEq/L (ref 19–32)
Calcium: 10.2 mg/dL (ref 8.4–10.5)
Chloride: 93 mEq/L — ABNORMAL LOW (ref 96–112)
Creatinine, Ser: 0.9 mg/dL (ref 0.50–1.10)
GFR calc Af Amer: 68 mL/min — ABNORMAL LOW (ref >90)
GFR calc non Af Amer: 59 mL/min — ABNORMAL LOW (ref >90)
Glucose, Bld: 108 mg/dL — ABNORMAL HIGH (ref 70–99)
Potassium: 3.9 mEq/L (ref 3.5–5.1)
Sodium: 132 mEq/L — ABNORMAL LOW (ref 135–145)
Total Bilirubin: 1 mg/dL (ref 0.3–1.2)
Total Protein: 8.7 g/dL — ABNORMAL HIGH (ref 6.0–8.3)

## 2013-04-28 LAB — CBC
HCT: 36.5 % (ref 36.0–46.0)
Hemoglobin: 12.3 g/dL (ref 12.0–15.0)
MCH: 29.1 pg (ref 26.0–34.0)
MCHC: 33.7 g/dL (ref 30.0–36.0)
MCV: 86.3 fL (ref 78.0–100.0)
Platelets: 302 10*3/uL (ref 150–400)
RBC: 4.23 MIL/uL (ref 3.87–5.11)
WBC: 9.6 10*3/uL (ref 4.0–10.5)

## 2013-04-28 LAB — URINALYSIS, ROUTINE W REFLEX MICROSCOPIC
Glucose, UA: NEGATIVE mg/dL
Hgb urine dipstick: NEGATIVE
Ketones, ur: 40 mg/dL — AB
Leukocytes, UA: NEGATIVE
Nitrite: NEGATIVE
Protein, ur: 100 mg/dL — AB
Specific Gravity, Urine: 1.035 — ABNORMAL HIGH (ref 1.005–1.030)
Urobilinogen, UA: 1 mg/dL (ref 0.0–1.0)
pH: 6 (ref 5.0–8.0)

## 2013-04-28 LAB — DIFFERENTIAL
Lymphocytes Relative: 25 % (ref 12–46)
Lymphs Abs: 2.4 10*3/uL (ref 0.7–4.0)
Monocytes Absolute: 0.6 10*3/uL (ref 0.1–1.0)
Monocytes Relative: 6 % (ref 3–12)
Neutro Abs: 6.6 10*3/uL (ref 1.7–7.7)
Neutrophils Relative %: 69 % (ref 43–77)

## 2013-04-28 LAB — PROTIME-INR
INR: 1.17 (ref 0.00–1.49)
Prothrombin Time: 14.7 seconds (ref 11.6–15.2)

## 2013-04-28 LAB — APTT: aPTT: 26 seconds (ref 24–37)

## 2013-04-28 LAB — POCT I-STAT TROPONIN I: Troponin i, poc: 0.04 ng/mL (ref 0.00–0.08)

## 2013-04-28 LAB — RAPID URINE DRUG SCREEN, HOSP PERFORMED
Amphetamines: NOT DETECTED
Barbiturates: NOT DETECTED
Benzodiazepines: POSITIVE — AB
Cocaine: NOT DETECTED
Opiates: NOT DETECTED
Tetrahydrocannabinol: NOT DETECTED

## 2013-04-28 LAB — GLUCOSE, CAPILLARY: Glucose-Capillary: 105 mg/dL — ABNORMAL HIGH (ref 70–99)

## 2013-04-28 LAB — URINE MICROSCOPIC-ADD ON

## 2013-04-28 LAB — ETHANOL: Alcohol, Ethyl (B): <11 mg/dL (ref 0–11)

## 2013-04-28 LAB — TROPONIN I: Troponin I: <0.3 ng/mL (ref <0.30)

## 2013-04-28 NOTE — ED Provider Notes (Signed)
CSN: [id number]     Arrival date & time 04/28/13  1944 History   First MD Initiated Contact with Patient 04/28/13 2012     Chief Complaint  Patient presents with  . Aphasia  . Altered Mental Status   (Consider location/radiation/quality/duration/timing/severity/associated sxs/prior Treatment) HPI Patient presents with her daughter who relays concerns. Essentially it seems as though over the past days the patient has had intermittent episodes of speech difficulty, disorientation. No report of new falls, fever, cough, vomiting, diarrhea, rash. The patient acknowledges the concerns of the daughter, states that she does not feel right, states that she is having difficulty finding words. No new pain. The patient does have chronic pain in multiple areas, and had MRI earlier today for low back pain. Today, the speech seemed to be more persistently bad approximately 8 hours ago. Patient's daughter states that over the past week the patient has had her medications changed significantly, with dosing of both benzodiazepine and narcotic changed.  Past Medical History  Diagnosis Date  . Malignant lymphoplasmacytic lymphoma 2004  . Hypertension   . Arthritis   . Hypercholesteremia   . IBS (irritable bowel syndrome)   . Diverticulosis of intestine     H/O  . DJD (degenerative joint disease) of lumbar spine   . Left knee DJD   . Right knee DJD   . Chronic cough   . Asthma   . Cataract    Past Surgical History  Procedure Laterality Date  . Tonsillectomy and adenoidectomy  childhood  . Abdominal hysterectomy  1984    TAH   No family history on file. History  Substance Use Topics  . Smoking status: Former Smoker -- 1.00 packs/day for 25 years    Types: Cigarettes    Quit date: 08/04/1979  . Smokeless tobacco: Never Used  . Alcohol Use: No   OB History   Grav Para Term Preterm Abortions TAB SAB Ect Mult Living                 Review of Systems  Constitutional:       Per HPI,  otherwise negative  HENT:       Per HPI, otherwise negative  Respiratory:       Per HPI, otherwise negative  Cardiovascular:       Per HPI, otherwise negative  Gastrointestinal: Negative for vomiting.  Endocrine:       Negative aside from HPI  Genitourinary:       Neg aside from HPI   Musculoskeletal:       Per HPI, otherwise negative  Skin: Negative.   Neurological: Positive for weakness and light-headedness. Negative for syncope and facial asymmetry.    Allergies  Codeine; Erythromycin; Lisinopril; Oxybutynin; and Sulfa antibiotics  Home Medications   Current Outpatient Rx  Name  Route  Sig  Dispense  Refill  . aspirin 81 MG tablet   Oral   Take 81 mg by mouth at bedtime.          . budesonide-formoterol (SYMBICORT) 80-4.5 MCG/ACT inhaler   Inhalation   Inhale 2 puffs into the lungs 2 (two) times daily.         . cholecalciferol (VITAMIN D) 1000 UNITS tablet   Oral   Take 1,000 Units by mouth daily.          . cholestyramine (QUESTRAN) 4 GM/DOSE powder   Oral   Take 4 g by mouth daily as needed.          Marland Kitchen  cyanocobalamin 1000 MCG tablet   Oral   Take 100 mcg by mouth daily.         . diazepam (VALIUM) 5 MG tablet   Oral   Take 1 tablet (5 mg total) by mouth 2 (two) times daily.   10 tablet   0   . ferrous sulfate 325 (65 FE) MG tablet   Oral   Take 325 mg by mouth daily with breakfast.         . fluticasone (FLONASE) 50 MCG/ACT nasal spray   Nasal   Place 1 spray into the nose 2 (two) times daily as needed. Each nostril         . furosemide (LASIX) 20 MG tablet   Oral   Take 1 tablet (20 mg total) by mouth daily.   30 tablet   3   . hydrochlorothiazide (HYDRODIURIL) 25 MG tablet   Oral   Take 25 mg by mouth daily.         . Multiple Vitamin (MULTIVITAMIN) capsule   Oral   Take 1 capsule by mouth daily. With Vitamin D 4000         . ondansetron (ZOFRAN) 4 MG tablet   Oral   Take 1 tablet (4 mg total) by mouth every 6 (six)  hours.   12 tablet   0   . oxyCODONE-acetaminophen (PERCOCET/ROXICET) 5-325 MG per tablet   Oral   Take 2 tablets by mouth every 6 (six) hours as needed for pain.   12 tablet   0   . potassium chloride (K-DUR) 10 MEQ tablet   Oral   Take 10 mEq by mouth daily.          BP 146/69  Pulse 82  Temp(Src) 98.4 F (36.9 C) (Oral)  SpO2 94% Physical Exam  Nursing note and vitals reviewed. Constitutional: She is oriented to person, place, and time. She appears well-developed and well-nourished.  Listless elderly, obese female  HENT:  Head: Normocephalic and atraumatic.  Eyes: Conjunctivae and EOM are normal.  Cardiovascular: Normal rate and regular rhythm.   Pulmonary/Chest: Effort normal and breath sounds normal. No stridor. No respiratory distress.  Abdominal: She exhibits no distension.  Musculoskeletal: She exhibits no edema.  Neurological: She is alert and oriented to person, place, and time. She displays no atrophy and no tremor. No cranial nerve deficit or sensory deficit. She exhibits normal muscle tone. She displays no seizure activity. Coordination normal.  Skin: Skin is warm and dry.  Psychiatric: She has a normal mood and affect. Her speech is delayed. Her speech is not rapid and/or pressured and not slurred. She is slowed.    ED Course  Procedures (including critical care time) Labs Review Labs Reviewed  PROTIME-INR  APTT  ETHANOL  CBC  DIFFERENTIAL  COMPREHENSIVE METABOLIC PANEL  TROPONIN I  URINE RAPID DRUG SCREEN (HOSP PERFORMED)  URINALYSIS, ROUTINE W REFLEX MICROSCOPIC   pulse oximetry 99% room air normal  Imaging Review Mr Lumbar Spine Wo Contrast  04/28/2013   CLINICAL DATA:  79 year old female with severe low back pain radiating to both lower extremities.  EXAM: MRI LUMBAR SPINE WITHOUT CONTRAST  TECHNIQUE: Multiplanar, multisequence MR imaging was performed. No intravenous contrast was administered.  COMPARISON:  CT Abdomen and Pelvis 04/20/2013.  Chest CT 03/30/2013.  FINDINGS: Eleven pairs of ribs demonstrated on 03/30/2013, and absent ribs at T12. By this numbering system, the S1 level is sacralized but there is a full size S1-S2 disc space. The level  of mild grade 1 anterolisthesis with prominent vacuum disc phenomena on 04/20/2013 is L4-L5 (identical vacuum phenomena identified today on series 6, image 7). Correlation with radiographs is recommended prior to any operative intervention.  Stable vertebral height and alignment. Mild anterolisthesis of L4 on L5 measures about 4 mm. No marrow edema or evidence of acute osseous abnormality.  Visualized lower thoracic spinal cord is normal with conus medularis at L1-L2.  Stable visualized abdominal viscera. Visualized paraspinal soft tissues are within normal limits.  T11-T12: Mild posterior element hypertrophy. No definite stenosis.  T12-L1: Anterior disc osteophyte complex. No stenosis.  L1-L2: Mild disc bulge. No significant stenosis.  L2-L3: Mild circumferential disc bulge. Mild epidural lipomatosis. No significant stenosis.  L3-L4: Mild circumferential disc bulge. Moderate facet and ligament flavum hypertrophy. In conjunction with epidural lipomatosis there is mild to moderate spinal stenosis and right greater than left L3 foraminal stenosis.  L4-L5: Grade 1 spondylolisthesis. Mild to moderate circumferential disc bulge. Moderate to severe facet and moderate ligament flavum hypertrophy. Moderate spinal stenosis (series 7, image 17). Mild to moderate right greater than left L4 foraminal stenosis.  L5-S1: Mild circumferential disc osteophyte complex eccentric to the right. Chronic moderate to severe facet hypertrophy on the right, bilateral facet joint ankylosis suspected and better demonstrated on 04/20/2013. No significant stenosis. There is a small left foraminal annular tear of the disc (series 4, image 11).  S1-S2: Full size disc space. No stenosis. Incidental S2-S3 level small Tarlov cyst.  IMPRESSION:  1. Transitional anatomy, with numbering system on this study confirmed by Previous CT chest and CT abdomen and pelvis. Correlation with radiographs is recommended prior to any operative intervention.  2. Mild grade 1 spondylolisthesis at L4-L5 with disc and facet degeneration resulting in moderate spinal stenosis and mild to moderate right greater than left foraminal stenosis.  3. Multifactorial mild to moderate L3-L4 spinal and right greater than left foraminal stenosis.   Electronically Signed   By: Augusto Gamble M.D.   On: 04/28/2013 14:32   Review of the patient's chart demonstrates that today's MRI was largely reassuring.  Additionally, the patient was seen one week ago, and I reviewed that encounter.  10:50 PM No current speech difficulty. Labs reviewed with the patient and her daughter.  Patient will be admitted for further evaluation and management. MDM  No diagnosis found. This elderly female presents with intermittent speech difficulties today's, and intermittent disorientation. Patient has multiple medical problems, and has recently changed medication, including benzodiazepine and narcotics.  Thus, this is a possible etiology, but with her advanced age, CT evidence of prior strokes, no current antiplatelet therapy, there is some concern for TIA.  Patient was admitted for further evaluation and management.    Gerhard Munch, MD 04/28/13 [phone]

## 2013-04-28 NOTE — Telephone Encounter (Signed)
[id number].Jennifer Castillo, CMA  

## 2013-04-29 ENCOUNTER — Observation Stay (HOSPITAL_COMMUNITY): Payer: Medicare Other

## 2013-04-29 DIAGNOSIS — R4182 Altered mental status, unspecified: Secondary | ICD-10-CM | POA: Diagnosis not present

## 2013-04-29 DIAGNOSIS — I10 Essential (primary) hypertension: Secondary | ICD-10-CM

## 2013-04-29 DIAGNOSIS — J45909 Unspecified asthma, uncomplicated: Secondary | ICD-10-CM | POA: Diagnosis not present

## 2013-04-29 DIAGNOSIS — Z9989 Dependence on other enabling machines and devices: Secondary | ICD-10-CM | POA: Diagnosis present

## 2013-04-29 DIAGNOSIS — G459 Transient cerebral ischemic attack, unspecified: Secondary | ICD-10-CM | POA: Diagnosis present

## 2013-04-29 DIAGNOSIS — M549 Dorsalgia, unspecified: Secondary | ICD-10-CM

## 2013-04-29 DIAGNOSIS — K219 Gastro-esophageal reflux disease without esophagitis: Secondary | ICD-10-CM

## 2013-04-29 DIAGNOSIS — G4733 Obstructive sleep apnea (adult) (pediatric): Secondary | ICD-10-CM | POA: Diagnosis present

## 2013-04-29 DIAGNOSIS — E785 Hyperlipidemia, unspecified: Secondary | ICD-10-CM | POA: Diagnosis present

## 2013-04-29 DIAGNOSIS — I6381 Other cerebral infarction due to occlusion or stenosis of small artery: Secondary | ICD-10-CM | POA: Diagnosis present

## 2013-04-29 LAB — LIPID PANEL
Cholesterol: 126 mg/dL (ref 0–200)
HDL: 45 mg/dL (ref >39)
LDL Cholesterol: 62 mg/dL (ref 0–99)
Total CHOL/HDL Ratio: 2.8 RATIO
Triglycerides: 95 mg/dL (ref <150)
VLDL: 19 mg/dL (ref 0–40)

## 2013-04-29 LAB — TSH: TSH: 1.027 u[IU]/mL (ref 0.350–4.500)

## 2013-04-29 LAB — HEMOGLOBIN A1C
Hgb A1c MFr Bld: 5.6 % (ref <5.7)
Mean Plasma Glucose: 114 mg/dL (ref <117)

## 2013-04-29 LAB — GLUCOSE, CAPILLARY
Glucose-Capillary: 83 mg/dL (ref 70–99)
Glucose-Capillary: 81 mg/dL (ref 70–99)
Glucose-Capillary: 80 mg/dL (ref 70–99)
Glucose-Capillary: 85 mg/dL (ref 70–99)

## 2013-04-29 LAB — VITAMIN B12: Vitamin B-12: 1933 pg/mL — ABNORMAL HIGH (ref 211–911)

## 2013-04-29 MED ORDER — HEPARIN SODIUM (PORCINE) 5000 UNIT/ML IJ SOLN
5000.0000 [IU] | Freq: Three times a day (TID) | INTRAMUSCULAR | Status: DC
  Administered 2013-04-29 – 2013-05-01 (×8): 5000 [IU] via SUBCUTANEOUS
  Filled 2013-04-29 (×10): qty 1

## 2013-04-29 MED ORDER — INFLUENZA VAC SPLIT QUAD 0.5 ML IM SUSP
0.5000 mL | INTRAMUSCULAR | Status: AC
  Administered 2013-04-30: 0.5 mL via INTRAMUSCULAR
  Filled 2013-04-29 (×2): qty 0.5

## 2013-04-29 MED ORDER — POTASSIUM CHLORIDE ER 10 MEQ PO TBCR
20.0000 meq | EXTENDED_RELEASE_TABLET | Freq: Every day | ORAL | Status: DC
  Administered 2013-04-29 – 2013-05-01 (×3): 20 meq via ORAL
  Filled 2013-04-29 (×3): qty 2

## 2013-04-29 MED ORDER — BUDESONIDE-FORMOTEROL FUMARATE 80-4.5 MCG/ACT IN AERO
2.0000 | INHALATION_SPRAY | Freq: Two times a day (BID) | RESPIRATORY_TRACT | Status: DC
  Administered 2013-04-29 – 2013-05-01 (×5): 2 via RESPIRATORY_TRACT
  Filled 2013-04-29: qty 6.9

## 2013-04-29 MED ORDER — FUROSEMIDE 20 MG PO TABS
20.0000 mg | ORAL_TABLET | Freq: Every day | ORAL | Status: DC
  Administered 2013-04-29 – 2013-05-01 (×3): 20 mg via ORAL
  Filled 2013-04-29 (×3): qty 1

## 2013-04-29 MED ORDER — VITAMIN B-12 1000 MCG PO TABS
1000.0000 ug | ORAL_TABLET | Freq: Every day | ORAL | Status: DC
  Administered 2013-04-29: 1000 ug via ORAL
  Filled 2013-04-29: qty 1

## 2013-04-29 MED ORDER — CLOPIDOGREL BISULFATE 75 MG PO TABS
75.0000 mg | ORAL_TABLET | Freq: Every day | ORAL | Status: DC
  Administered 2013-04-29 – 2013-05-01 (×3): 75 mg via ORAL
  Filled 2013-04-29 (×4): qty 1

## 2013-04-29 MED ORDER — PANTOPRAZOLE SODIUM 40 MG PO TBEC
40.0000 mg | DELAYED_RELEASE_TABLET | Freq: Every day | ORAL | Status: DC
  Administered 2013-04-29 – 2013-05-01 (×3): 40 mg via ORAL
  Filled 2013-04-29 (×3): qty 1

## 2013-04-29 MED ORDER — SODIUM CHLORIDE 0.9 % IJ SOLN
3.0000 mL | Freq: Two times a day (BID) | INTRAMUSCULAR | Status: DC
  Administered 2013-04-29 – 2013-05-01 (×4): 3 mL via INTRAVENOUS

## 2013-04-29 MED ORDER — FLUTICASONE PROPIONATE 50 MCG/ACT NA SUSP
1.0000 | Freq: Every day | NASAL | Status: DC
Start: 2013-04-29 — End: 2013-05-01
  Administered 2013-04-29 – 2013-05-01 (×3): 1 via NASAL
  Filled 2013-04-29: qty 16

## 2013-04-29 MED ORDER — ACETAMINOPHEN 650 MG RE SUPP
650.0000 mg | Freq: Four times a day (QID) | RECTAL | Status: DC | PRN
  Administered 2013-04-29 (×3): 650 mg via RECTAL
  Filled 2013-04-29 (×3): qty 1

## 2013-04-29 NOTE — Evaluation (Signed)
Occupational Therapy Evaluation Patient Details Name: Natasha Ellis MRN: [id number] DOB: [birth year] Today's Date: 04/29/2013 Time: 8295-6213 OT Time Calculation (min): 22 min  OT Assessment / Plan / Recommendation History of present illness from H&P 9/27: "Natasha Ellis is a 79 y.o. 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 Patient reports that for the last week she had been very restless do to acute lower back pain and that she has found some difficulty finding words. According to the daughter who was at bedside patient was also having intermittent episodes of AMS, confusion and inability to to her usual ADL's. Patient has been recently started on Valium to help with her lower back pain and muscle spasms. Patient denies any chest pain, shortness of breath, fever, chills, nausea/vomiting, abdominal pain, melena, hematochezia or any other acute complaints. Patient was last seen normal around 7:30 PM on 04/27/13. Given to that her symptoms were intermittent no medical attention was seek until 9/26 evening. In the ED patient CT scan did not reveal any acute intracranial abnormalities, but demonstrated wall lacunar infarction. Due to the concerns for TIA/stroke like symptoms triad hospitalist has been called to admit the patient for further evaluation and treatment.   Clinical Impression   This 79 yo female admitted with above. Back pain has been new onset, the only thing the daughter can think of that she might have done was the day before this started she was using a non-electric sweeper pushing it around her house. Pt in too much pain to be able to take care of herself, will need SNF before she can go back to Independent living.    OT Assessment  Patient needs continued OT Services    Follow  Up Recommendations  SNF    Barriers to Discharge Decreased caregiver support    Equipment Recommendations  None recommended by OT       Frequency  Min 2X/week    Precautions / Restrictions Precautions Precautions: Fall Restrictions Weight Bearing Restrictions: No   Pertinent Vitals/Pain 8/10 Bil LEs from buttocks down legs with ambulation (RLE worse than LLE)    ADL  Eating/Feeding: Independent Where Assessed - Eating/Feeding: Chair Grooming: Set up;Supervision/safety Where Assessed - Grooming: Supported sitting Upper Body Bathing: Set up;Supervision/safety Where Assessed - Upper Body Bathing: Supported sitting Lower Body Bathing: +1 Total assistance Where Assessed - Lower Body Bathing: Supported sit to stand Upper Body Dressing: Minimal assistance Where Assessed - Upper Body Dressing: Supported sitting Lower Body Dressing: +1 Total assistance Where Assessed - Lower Body Dressing: Supported sit to Pharmacist, hospital: Minimal Dentist Method: Sit to Barista:  (recliner>to bathroom door and back to recliner) Toileting - Clothing Manipulation and Hygiene: +1 Total assistance Where Assessed - Toileting Clothing Manipulation and Hygiene: Sit to stand from 3-in-1 or toilet Equipment Used: Gait belt;Rolling walker Transfers/Ambulation Related to ADLs: Mod A sit to stand, min A stand to sit, min A with ambulation short distance with RW ADL Comments: Usually turns sideways on bed to don socks. Per daughter she ususally removes her socks by using the other foot and then picks them up with her reacher    OT Diagnosis: Generalized weakness;Acute pain  OT Problem List: Decreased strength;Obesity;Pain;Impaired balance (sitting and/or standing);Decreased activity tolerance;Decreased knowledge of use of DME or AE OT Treatment Interventions: Self-care/ADL training;Patient/family education;DME and/or AE instruction;Balance  training;Therapeutic  activities   OT Goals(Current goals can be found in the care plan section) Acute Rehab OT Goals OT Goal Formulation: With patient Time For Goal Achievement: 05/13/13 Potential to Achieve Goals: Good  Visit Information  Last OT Received On: 04/29/13 Assistance Needed: +1 History of Present Illness: from H&P 9/27: "Natasha Ellis is a 79 y.o. 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 Patient reports that for the last week she had been very restless do to acute lower back pain and that she has found some difficulty finding words. According to the daughter who was at bedside patient was also having intermittent episodes of AMS, confusion and inability to to her usual ADL's. Patient has been recently started on Valium to help with her lower back pain and muscle spasms. Patient denies any chest pain, shortness of breath, fever, chills, nausea/vomiting, abdominal pain, melena, hematochezia or any other acute complaints. Patient was last seen normal around 7:30 PM on 04/27/13. Given to that her symptoms were intermittent no medical attention was seek until 9/26 evening. In the ED patient CT scan did not reveal any acute intracranial abnormalities, but demonstrated wall lacunar infarction. Due to the concerns for TIA/stroke like symptoms triad hospitalist has been called to admit the patient for further evaluation and treatment.       Prior Functioning     Home Living Family/patient expects to be discharged to:: Private residence Living Arrangements: Spouse/significant other (early dementia) Available Help at Discharge: Family;Available 24 hours/day Type of Home: House Home Access: Level entry Home Layout: One level Home Equipment: Walker - 4 wheels;Cane - single point Lexicographer) Additional  Comments: Lives in Independent home at [place]. drives to dining area. Lives with husband, who she assists due to early dementia/alzheimers. She has had more difficulty getting up out of any chair in her home for past two weeks since this onset of low back pain.  Prior Function Level of Independence: Independent Comments: uses cane for past year and half since L knee scope in April 2013. Last 2 weeks have had mroe difficulty than usual.  Communication Communication: HOH Dominant Hand: Right         Vision/Perception Vision - History Patient Visual Report: No change from baseline   Cognition  Cognition Arousal/Alertness: Awake/alert Behavior During Therapy: WFL for tasks assessed/performed Overall Cognitive Status: Within Functional Limits for tasks assessed    Extremity/Trunk Assessment Upper Extremity Assessment Upper Extremity Assessment: Generalized weakness     Mobility Bed Mobility Details for Bed Mobility Assistance: Pt up in recliner upon arrival Transfers Transfers: Sit to Stand;Stand to Sit Sit to Stand: 3: Mod assist;With upper extremity assist;With armrests;From chair/3-in-1 Stand to Sit: 3: Mod assist;With upper extremity assist;With armrests;To chair/3-in-1 Details for Transfer Assistance: VCs for safe hand placememt           End of Session OT - End of Session Equipment Utilized During Treatment: Gait belt;Rolling walker Activity Tolerance: Patient limited by fatigue;Patient limited by pain Patient left: in chair;with call bell/phone within reach;with family/visitor present   Functional Assessment Tool Used: Clincal observation Functional Limitation: Self care Self Care Current Status (W0981): At least 80 percent but less than 100 percent impaired, limited or restricted Self Care Goal Status (X9147): At least 40 percent but less than 60 percent impaired, limited or restricted   Evette Georges [phone] 04/29/2013, 3:54  PM

## 2013-04-29 NOTE — Evaluation (Signed)
Physical Therapy Evaluation Patient Details Name: Natasha Ellis MRN: [id number] DOB: [birth year] Today's Date: 04/29/2013 Time: 1478-2956 PT Time Calculation (min): 37 min  PT Assessment / Plan / Recommendation History of Present Illness  from H&P 9/27: "Natasha Ellis is a 79 y.o. 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 Patient reports that for the last week she had been very restless do to acute lower back pain and that she has found some difficulty finding words. According to the daughter who was at bedside patient was also having intermittent episodes of AMS, confusion and inability to to her usual ADL's. Patient has been recently started on Valium to help with her lower back pain and muscle spasms. Patient denies any chest pain, shortness of breath, fever, chills, nausea/vomiting, abdominal pain, melena, hematochezia or any other acute complaints. Patient was last seen normal around 7:30 PM on 04/27/13. Given to that her symptoms were intermittent no medical attention was seek until 9/26 evening. In the ED patient CT scan did not reveal any acute intracranial abnormalities, but demonstrated wall lacunar infarction. Due to the concerns for TIA/stroke like symptoms triad hospitalist has been called to admit the patient for further evaluation and treatment.  Clinical Impression  Pt presents with generalized weakness, great difficulty with sit to stand and great pain in Low back area with bed mobility, supine flat lying . Pt reports pain is not radiating down either side, just central to Low back area, but has had increased difficulty with moving since this pain. Reports no significant event to cause this (ie, no fall, or back sprain moment) . She did not seem to have difficulty with  speech/language during my assessment, but remembered she did a few days ago. She also reported being at the computer and not remembering how to log on one day.   She presents with generalized weakness of LES and low back pain causing mobility (specifically sit to stand) very difficult and challenging. Pt to benefit from continued therapy here and after acute care to get her back to independent ability with a a cane. May need to go to SNF area at [facility] prior to returning to her [facility] independent apartment. Will see how pt progresses here.      PT Assessment  Patient needs continued PT services    Follow Up Recommendations  SNF    Does the patient have the potential to tolerate intense rehabilitation      Barriers to Discharge        Equipment Recommendations  None recommended by PT (pt states she has RW )    Recommendations for Other Services     Frequency Min 3X/week    Precautions / Restrictions Restrictions Weight Bearing Restrictions: No   Pertinent Vitals/Pain LB pain improved with sitting EOB and in recliner to "tolerable pain level", but when supine on back in bed, pain was much higher, pt not reporting by number.        Mobility  Bed Mobility Bed Mobility: Sit to Supine;Supine to Sit;Sitting - Scoot to Edge of Bed Supine to Sit: 3: Mod assist;With rails;HOB elevated Sitting - Scoot to Edge of Bed: 3: Mod assist Sit to Supine: 3: Mod assist;HOB elevated;With rail Details for Bed Mobility Assistance: pt with great difficulty scooting hips in bed in either direction and with getting LEs back in bed for sit  to supine.  Transfers Transfers: Sit to Stand;Stand to Sit Sit to Stand: 3: Mod assist;From elevated surface;With upper extremity assist;From chair/3-in-1;From bed Stand to Sit: 4: Min assist;With upper extremity assist;With armrests;To chair/3-in-1;To bed Details for Transfer Assistance: PT had to do multiple attempts for sit to stand )3x) with assist  inorder to rise to stadning. Great difficulty. From recliner seemed to be a little easier.  Ambulation/Gait Ambulation/Gait Assistance: 4: Min assist Ambulation Distance (Feet): 4 Feet Assistive device: Rolling walker Ambulation/Gait Assistance Details: took few steps to chair and then back to bed . With wide stande very unsure gait pattern, using RW greatly.     Exercises     PT Diagnosis: Difficulty walking;Generalized weakness  PT Problem List: Decreased strength;Decreased mobility;Decreased knowledge of use of DME;Decreased activity tolerance PT Treatment Interventions: DME instruction;Gait training;Functional mobility training;Therapeutic activities;Therapeutic exercise;Patient/family education     PT Goals(Current goals can be found in the care plan section) Acute Rehab PT Goals Patient Stated Goal: to get home and get stronger.  PT Goal Formulation: With patient Time For Goal Achievement: 05/13/13 Potential to Achieve Goals: Good  Visit Information  Last PT Received On: 04/29/13 Assistance Needed: +1 (may need +2 for walking with f/u chair) History of Present Illness: from H&P 9/27: "Natasha Ellis is a 79 y.o. 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 Patient reports that for the last week she had been very restless do to acute lower back pain and that she has found some difficulty finding words. According to the daughter who was at bedside patient was also having intermittent episodes of AMS, confusion and inability to to her usual ADL's. Patient has been recently started on Valium to help with her lower back pain and muscle spasms. Patient denies any chest pain, shortness of breath, fever, chills, nausea/vomiting, abdominal pain, melena, hematochezia or any other acute  complaints. Patient was last seen normal around 7:30 PM on 04/27/13. Given to that her symptoms were intermittent no medical attention was seek until 9/26 evening. In the ED patient CT scan did not reveal any acute intracranial abnormalities, but demonstrated wall lacunar infarction. Due to the concerns for TIA/stroke like symptoms triad hospitalist has been called to admit the patient for further evaluation and treatment.       Prior Functioning  Home Living Family/patient expects to be discharged to:: Unsure (she lives in indep. apt at [facility], aware may need more) Additional Comments: Lives in Independent home at [place]. drives to dining area. Lives with husband, who she assists due to early dementia/alzheimers. She has had more difficulty getting up out of any chair in her home for past two weeks since this onset of low back pain.  Prior Function Level of Independence: Independent Comments: uses cane for past year and half since L knee scope in April 2013. Last 2 weeks have had mroe difficulty than usual.  Communication Communication: HOH;No difficulties (dtr took hearing aides home due to expensive. )    Cognition  Cognition Arousal/Alertness: Awake/alert Behavior During Therapy: WFL for tasks assessed/performed Overall Cognitive Status: Within Functional Limits for tasks assessed    Extremity/Trunk Assessment Lower Extremity Assessment Lower Extremity Assessment: Generalized weakness (but all able to resist resistance)   Balance    End of Session PT - End of Session Equipment Utilized During Treatment: Gait belt Activity Tolerance: Patient tolerated treatment  well (Felt better with LB pain in recliner, but ECO needed pt in b) Patient left: in bed (back in bed for eco) Nurse Communication: Mobility status  GP Functional Assessment Tool Used: clinical judgement Functional Limitation: Mobility: Walking and moving around Mobility: Walking and Moving Around Current Status  (W0981): At least 40 percent but less than 60 percent impaired, limited or restricted Mobility: Walking and Moving Around Goal Status [phone]): At least 1 percent but less than 20 percent impaired, limited or restricted   Marella Bile 04/29/2013, 9:52 AM Marella Bile, PT Pager: [phone] 04/29/2013

## 2013-04-29 NOTE — Progress Notes (Signed)
Pt is refusing CPAP for tonight. Pt wears CPAP at home but with nasal pillows. She does not want to wear our mask she would like her home mask. Pt family said they would bring her mask in for her to wear tomorrow.

## 2013-04-29 NOTE — Progress Notes (Signed)
*  PRELIMINARY RESULTS* Vascular Ultrasound Carotid Duplex (Doppler) has been completed.  Preliminary findings: Bilateral:  1-39% ICA stenosis.  Vertebral artery flow is antegrade.      [name], Natasha Ellis 04/29/2013, 8:53 AM

## 2013-04-29 NOTE — H&P (Signed)
[facility] History and Physical  Natasha Ellis NFA:[id number] DOB: 12/01/1930 DOA: 04/28/2013  Referring physician: Dr. Jeraldine Loots PCP: Ginette Otto, MD  Specialists: Dr. Katrinka Blazing (cardiology)  Chief Complaint: Aphasia, stroke like symptoms  HPI: Natasha Ellis is a 79 y.o. 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 Patient reports that for the last week she had been very restless do to acute lower back pain and that she has found some difficulty finding words. According to the daughter who was at bedside patient was also having intermittent episodes of AMS, confusion and inability to to her usual ADL's. Patient has been recently started on Valium to help with her lower back pain and muscle spasms. Patient denies any chest pain, shortness of breath, fever, chills, nausea/vomiting, abdominal pain, melena, hematochezia or any other acute complaints. Patient was last seen normal around 7:30 PM on 04/27/13. Given to that her symptoms were intermittent no medical attention was seek until 9/26 evening. In the ED patient CT scan did not reveal any acute intracranial abnormalities, but demonstrated wall lacunar infarction. Due to the concerns for TIA/stroke like symptoms [facility] has been called to admit the patient for further evaluation and treatment.   Review of Systems:  Negative except as otherwise mentioned on history of present illness.  Past Medical History  Diagnosis Date  . Malignant lymphoplasmacytic lymphoma 2004  . Hypertension   . Arthritis   . Hypercholesteremia   . IBS (irritable bowel syndrome)   . Diverticulosis of intestine     H/O  . DJD (degenerative joint disease) of lumbar spine   . Left knee DJD   . Right knee DJD   . Chronic cough   . Asthma   .  Cataract    Past Surgical History  Procedure Laterality Date  . Tonsillectomy and adenoidectomy  childhood  . Abdominal hysterectomy  1984    TAH   Social History:  reports that she quit smoking about 33 years ago. Her smoking use included Cigarettes. She has a 25 pack-year smoking history. She has never used smokeless tobacco. She reports that she does not drink alcohol or use illicit drugs. Patient lives at the [facility] home with her husband (who has dementia), require some mild assistance with activities of daily living at baseline; but both of them [place].  Allergies  Allergen Reactions  . Codeine Other (See Comments)    Crazy-nightmares  . Erythromycin Other (See Comments)    GI upset  . Lisinopril Cough  . Oxybutynin   . Sulfa Antibiotics Other (See Comments)    GI upset   Family history: Better patient's daughter hypertension was the main problem. Patient unable to contribute any further family medical problems.  Prior to Admission medications   Medication Sig Start Date End Date Taking? Authorizing Provider  acetaminophen (TYLENOL) 500 MG tablet Take 500 mg by mouth every 6 (six) hours as needed for pain.   Yes Historical Provider, MD  aspirin 81 MG tablet Take 81 mg by mouth at bedtime.    Yes Historical Provider, MD  budesonide-formoterol (SYMBICORT) 80-4.5 MCG/ACT inhaler Inhale 2 puffs into the lungs 2 (two) times daily.   Yes Historical Provider, MD  cholecalciferol (VITAMIN D) 1000 UNITS tablet Take 1,000 Units by mouth daily.    Yes Historical Provider, MD  cholestyramine (  QUESTRAN) 4 GM/DOSE powder Take 4 g by mouth daily as needed.    Yes Historical Provider, MD  cyanocobalamin 1000 MCG tablet Take 100 mcg by mouth daily.   Yes Historical Provider, MD  ferrous sulfate 325 (65 FE) MG tablet Take 325 mg by mouth daily with breakfast.   Yes Historical Provider, MD  fluticasone (FLONASE) 50 MCG/ACT nasal spray Place 1  spray into the nose 2 (two) times daily as needed. Each nostril   Yes Historical Provider, MD  furosemide (LASIX) 20 MG tablet Take 1 tablet (20 mg total) by mouth daily. 03/30/13  Yes Kalman Shan, MD  ibuprofen (ADVIL,MOTRIN) 200 MG tablet Take 200 mg by mouth every 6 (six) hours as needed for pain.   Yes Historical Provider, MD  Multiple Vitamin (MULTIVITAMIN) capsule Take 1 capsule by mouth daily. With Vitamin D 4000   Yes Historical Provider, MD  potassium chloride (K-DUR) 10 MEQ tablet Take 10 mEq by mouth daily.   Yes Historical Provider, MD   Physical Exam: Filed Vitals:   04/28/13 2012  BP: 146/69  Pulse: 82  Temp: 98.4 F (36.9 C)     General:  No acute distress, alert, awake and oriented x2 (daughter reports that the patient mentation has improve in comparison to her mental status prior to visiting the ED), afebrile, cooperative to examination. Patient was appropriate on her answers but me sitting some details specifically regarding date and timing; she also was taking her time before answering and when asked about reason for that, she said she had some difficulty finding the right words.  Eyes: PERRLA, no nystagmus, extraocular muscles intact  ENT: tongue midline, no uvula deviation, no facial droop; Moist mucous membranes, no erythema or exudate inside her mouth. No ears or nostrils drainage  Neck: supple, no bruits, no JVD  Cardiovascular: S1 and S2, no rubs, no gallops, no murmurs  Respiratory: good air movement bilaterally, no wheezing, no crackles  Abdomen: soft, nontender, nondistended, positive bowel sounds  Skin: no rash, no petechiae.  Musculoskeletal: no joint swelling or erythema, lower extremities with trace edema bilaterally  Psychiatric:  No SI, no hallucinations  Neurologic: Cranial nerves grossly intact, patient alert, awake oriented x2; no pronation drift, upper extremities with 5 out of 5 bilateral strength and good hand grips; right lower  extremity 3-4/5 (according to patient has been going on since she had her acute back pain about week prior to admission;  On the other hand daughter reports that is worse today) and left lower extremity 4/5 (due to poor effort). Normal finger to nose, preserved light touch and pinprick sensation.  Labs on Admission:  Basic Metabolic Panel:  Recent Labs Lab 04/28/13 2017  NA 132*  K 3.9  CL 93*  CO2 25  GLUCOSE 108*  BUN 27*  CREATININE 0.90  CALCIUM 10.2   Liver Function Tests:  Recent Labs Lab 04/28/13 2017  AST 38*  ALT 29  ALKPHOS 72  BILITOT 1.0  PROT 8.7*  ALBUMIN 3.5   CBC:  Recent Labs Lab 04/28/13 2017  WBC 9.6  NEUTROABS 6.6  HGB 12.3  HCT 36.5  MCV 86.3  PLT 302   Cardiac Enzymes:  Recent Labs Lab 04/28/13 2017  TROPONINI <0.30    BNP (last 3 results)  Recent Labs  03/30/13 1631  PROBNP 70.0   CBG:  Recent Labs Lab 04/28/13 2153  GLUCAP 105*    Radiological Exams on Admission: Ct Head Wo Contrast  04/28/2013   CLINICAL DATA:  Altered mental status. Slurred speech.  EXAM: CT HEAD WITHOUT CONTRAST  TECHNIQUE: Contiguous axial images were obtained from the base of the skull through the vertex without intravenous contrast.  COMPARISON:  None.  FINDINGS: The ventricles are normal in configuration. There is ventricular and sulcal enlargement reflecting moderate atrophy. No hydrocephalus.  No parenchymal masses or mass effect. There is a focal area of hypoattenuation along the anterior superior right base a ganglia consistent with an old lacune infarct. Mild periventricular white matter hypoattenuation is noted consistent with chronic microvascular ischemic change. There is no evidence of a recent transcortical infarct.  There are no extra-axial masses or abnormal fluid collections.  There is no intracranial hemorrhage.  Mild maxillary and moderate ethmoid and inferior right frontal sinus mucosal thickening is noted with mild sphenoid sinus mucosal  thickening associated with some dependent secretions. The mastoid air cells are clear.  IMPRESSION: 1. No acute intracranial abnormalities. 2. 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 3. Sinus disease with mucosal thickening involving all sinus cavities.   Electronically Signed   By: Amie Portland   On: 04/28/2013 21:10   Mr Lumbar Spine Wo Contrast  04/28/2013   CLINICAL DATA:  79 year old female with severe low back pain radiating to both lower extremities.  EXAM: MRI LUMBAR SPINE WITHOUT CONTRAST  TECHNIQUE: Multiplanar, multisequence MR imaging was performed. No intravenous contrast was administered.  COMPARISON:  CT Abdomen and Pelvis 04/20/2013. Chest CT 03/30/2013.  FINDINGS: Eleven pairs of ribs demonstrated on 03/30/2013, and absent ribs at T12. By this numbering system, the S1 level is sacralized but there is a full size S1-S2 disc space. The level of mild grade 1 anterolisthesis with prominent vacuum disc phenomena on 04/20/2013 is L4-L5 (identical vacuum phenomena identified today on series 6, image 7). Correlation with radiographs is recommended prior to any operative intervention.  Stable vertebral height and alignment. Mild anterolisthesis of L4 on L5 measures about 4 mm. No marrow edema or evidence of acute osseous abnormality.  Visualized lower thoracic spinal cord is normal with conus medularis at L1-L2.  Stable visualized abdominal viscera. Visualized paraspinal soft tissues are within normal limits.  T11-T12: Mild posterior element hypertrophy. No definite stenosis.  T12-L1: Anterior disc osteophyte complex. No stenosis.  L1-L2: Mild disc bulge. No significant stenosis.  L2-L3: Mild circumferential disc bulge. Mild epidural lipomatosis. No significant stenosis.  L3-L4: Mild circumferential disc bulge. Moderate facet and ligament flavum hypertrophy. In conjunction with epidural lipomatosis there is mild to moderate spinal stenosis and right greater than  left L3 foraminal stenosis.  L4-L5: Grade 1 spondylolisthesis. Mild to moderate circumferential disc bulge. Moderate to severe facet and moderate ligament flavum hypertrophy. Moderate spinal stenosis (series 7, image 17). Mild to moderate right greater than left L4 foraminal stenosis.  L5-S1: Mild circumferential disc osteophyte complex eccentric to the right. Chronic moderate to severe facet hypertrophy on the right, bilateral facet joint ankylosis suspected and better demonstrated on 04/20/2013. No significant stenosis. There is a small left foraminal annular tear of the disc (series 4, image 11).  S1-S2: Full size disc space. No stenosis. Incidental S2-S3 level small Tarlov cyst.  IMPRESSION: 1. Transitional anatomy, with numbering system on this study confirmed by Previous CT chest and CT abdomen and pelvis. Correlation with radiographs is recommended prior to any operative intervention.  2. Mild grade 1 spondylolisthesis at L4-L5 with disc and facet degeneration resulting in moderate spinal stenosis and mild to moderate right greater than left foraminal stenosis.  3. Multifactorial mild to moderate L3-L4 spinal and right greater than left foraminal stenosis.   Electronically Signed   By: Augusto Gamble M.D.   On: 04/28/2013 14:32   Assessment/Plan 1-strokelike symptoms/TIA (transient ischemic attack): Patient with intermittent episodes of dysarthria, aphasia and altered mental status. Risk factors for stroke includes hypertension, hyperlipidemia -Patient will be admitted to telemetry, -Will check 2-D echo, carotid Dopplers, MRI and also TSH, B12 levels -For secondary prevention will substitute aspirating by Plavix -Neurology has been consulted -Will check A1c and lipid panel  2-HTN (hypertension): Currently stable. Will continue home regimen (Lasix).  3-HLD (hyperlipidemia): Currently not using any stockings. Will check lipid panel and based on results will determine appropriate therapy.  4-old Lacunar  infarction: Found on CT at the moment of admission. Age and no recall any symptoms that could be associated with her lacunar infarction. Most like secondary to mitral vascular disease -Continue risk factors modification -Aspirin switch to Plavix -Stroke workup as mentioned above.  5-OSA on CPAP: Continue CPAP at bedtime -When necessary oxygen supplementation throughout the day as needed.  6-GERD (gastroesophageal reflux disease): Continue PPI.  7-Back pain: Patient reports significant improvement after acute therapy with ibuprofen and Tylenol. Will continue when necessary Tylenol and ask physical therapy/occupational therapy to evaluate and provide further recommendations and the patient.  8-Asthma: Currently without wheezing or complaints of shortness of breath. -Continue Symbicort.  DVT prophylaxis: Heparin.   Dr. Leroy Kennedy (neuro-hospitalist)  Code Status: Full Family Communication: daughter at bedside Disposition Plan:observation, telemetry, LOS < 2 midnights  Time spent: 50 minutes  Natasha Ellis [facility] Pager [phone]  If 7PM-7AM, please contact night-coverage [email or web address] Password [facility] 04/29/2013, 12:44 AM

## 2013-04-29 NOTE — Progress Notes (Signed)
*  PRELIMINARY RESULTS* Echocardiogram 2D Echocardiogram has been performed.  Nestor Ramp M 04/29/2013, 11:08 AM

## 2013-04-29 NOTE — Progress Notes (Signed)
I have seen and assessed patient and I agree Dr. Ozella Almond assessment and plan. Carotid Dopplers with no significant ICA stenosis. 2-D echo is pending. MRI of the head was negative. Some clinical improvement oral who is at bedside. Continue Plavix. Continue secondary risk factor modification. Neurology following.

## 2013-04-29 NOTE — Consult Note (Signed)
NEURO HOSPITALIST CONSULT NOTE    Reason for Consult: speech/language difficulty, confusion  HPI:                                                                                                                                          Natasha Ellis is an 79 y.o. female with a past medical history significant for HTN, hypercholesterolemia, obstructive sleep apnea, DJD lumbar spine, arthritis, asthma, admitted to [facility] due to altered mental status and language difficulty. She stated that according to her daughter " I am not not talking right as I used to do". She is currently experiencing significant low back pain which makes this evaluation little bit difficult, but she said that for the past week her speech has been off an on slurred and many times she knows what she wants to say but can not find the right word and thus can not expressed herself appropriately. Family is not at bedside at this moment, but according to her hospital records this has been more noticeable to her daughter who also reported episodes of confusion and trouble performing her ADL. She has been recently started on valium to try to help with her back pain, but doesn't take narcotics and there has been not fever, infection, or falls. Denies HA, vertigo, double vision, difficulty swallowing, or visual impairment. CT brain during this admission revealed no acute abnormality.  Past Medical History  Diagnosis Date  . Malignant lymphoplasmacytic lymphoma 2004  . Hypertension   . Arthritis   . Hypercholesteremia   . IBS (irritable bowel syndrome)   . Diverticulosis of intestine     H/O  . DJD (degenerative joint disease) of lumbar spine   . Left knee DJD   . Right knee DJD   . Chronic cough   . Asthma   . Cataract     Past Surgical History  Procedure Laterality Date  . Tonsillectomy and adenoidectomy  childhood  . Abdominal hysterectomy  1984    TAH    No family history on file.   Social  History:  reports that she quit smoking about 33 years ago. Her smoking use included Cigarettes. She has a 25 pack-year smoking history. She has never used smokeless tobacco. She reports that she does not drink alcohol or use illicit drugs.  Allergies  Allergen Reactions  . Codeine Other (See Comments)    Crazy-nightmares  . Erythromycin Other (See Comments)    GI upset  . Lisinopril Cough  . Oxybutynin   . Sulfa Antibiotics Other (See Comments)    GI upset    MEDICATIONS:  I have reviewed the patient's current medications.   ROS:                                                                                                                                       History obtained from the patient and chart review.  General ROS: negative for - chills, fatigue, fever, night sweats, weight gain or weight loss Psychological ROS: negative for - behavioral disorder, hallucinations, memory difficulties, mood swings or suicidal ideation Ophthalmic ROS: negative for - blurry vision, double vision, eye pain or loss of vision ENT ROS: negative for - epistaxis, nasal discharge, oral lesions, sore throat, tinnitus or vertigo Allergy and Immunology ROS: negative for - hives or itchy/watery eyes Hematological and Lymphatic ROS: negative for - bleeding problems, bruising or swollen lymph nodes Endocrine ROS: negative for - galactorrhea, hair pattern changes, polydipsia/polyuria or temperature intolerance Respiratory ROS: negative for - cough, hemoptysis, shortness of breath or wheezing Cardiovascular ROS: negative for - chest pain, dyspnea on exertion, edema or irregular heartbeat Gastrointestinal ROS: negative for - abdominal pain, diarrhea, hematemesis, nausea/vomiting or stool incontinence Genito-Urinary ROS: negative for - dysuria, hematuria, incontinence or urinary  frequency/urgency Musculoskeletal ROS: negative for - joint swelling or muscular weakness. Chronic low back pain. Neurological ROS: as noted in HPI Dermatological ROS: negative for rash and skin lesion changes   Physical exam: pleasant female in moderate distress due to low back pain. Blood pressure 124/61, pulse 78, temperature 98.2 F (36.8 C), temperature source Oral, resp. rate 19, SpO2 98.00%. Head: normocephalic. Neck: supple, no bruits, no JVD. Cardiac: no murmurs. Lungs: clear. Abdomen: soft, no tender, no mass. Extremities: no edema.   Neurologic Examination: limited due to severe low back pain                                                                                       Mental Status: Alert, oriented, thought content appropriate.  Speech fluent without evidence of aphasia.  Able to follow 3 step commands without difficulty. Cranial Nerves: II: Discs flat bilaterally; Visual fields grossly normal, pupils equal, round, reactive to light and accommodation III,IV, VI: ptosis not present, extra-ocular motions intact bilaterally V,VII: smile symmetric, facial light touch sensation normal bilaterally VIII: hearing normal bilaterally IX,X: gag reflex present XI: bilateral shoulder shrug XII: midline tongue extension Motor: Pain limits full muscle testing, but she seems to be able to move all limbs symmetrically Tone and bulk:normal tone throughout; no atrophy noted Sensory: Pinprick and light touch intact throughout, bilaterally Deep Tendon Reflexes:  Right: Upper Extremity   Left:  Upper extremity   biceps (C-5 to C-6) 2/4   biceps (C-5 to C-6) 2/4 tricep (C7) 2/4    triceps (C7) 2/4 Brachioradialis (C6) 2/4  Brachioradialis (C6) 2/4  Lower Extremity Lower Extremity  quadriceps (L-2 to L-4) 2/4   quadriceps (L-2 to L-4) 2/4 Achilles (S1) 2/4   Achilles (S1) 2/4  Plantars: Right: downgoing   Left: downgoing Cerebellar: normal finger-to-nose,  Can not test  heel-to-shin test due to LB pain. Gait:  Unable to test CV: pulses palpable throughout    No results found for this basename: cbc, bmp, coags, chol, tri, ldl, hga1c    Results for orders placed during the hospital encounter of 04/28/13 (from the past 48 hour(s))  ETHANOL     Status: None   Collection Time    04/28/13  8:17 PM      Result Value Range   Alcohol, Ethyl (B) <11  0 - 11 mg/dL   Comment:            LOWEST DETECTABLE LIMIT FOR     SERUM ALCOHOL IS 11 mg/dL     FOR MEDICAL PURPOSES ONLY  PROTIME-INR     Status: None   Collection Time    04/28/13  8:17 PM      Result Value Range   Prothrombin Time 14.7  11.6 - 15.2 seconds   INR 1.17  0.00 - 1.49  APTT     Status: None   Collection Time    04/28/13  8:17 PM      Result Value Range   aPTT 26  24 - 37 seconds  CBC     Status: None   Collection Time    04/28/13  8:17 PM      Result Value Range   WBC 9.6  4.0 - 10.5 K/uL   Comment: CORRECTED ON 09/26 AT 2117: PREVIOUSLY REPORTED AS 9.2   RBC 4.23  3.87 - 5.11 MIL/uL   Hemoglobin 12.3  12.0 - 15.0 g/dL   HCT 16.1  09.6 - 04.5 %   MCV 86.3  78.0 - 100.0 fL   MCH 29.1  26.0 - 34.0 pg   MCHC 33.7  30.0 - 36.0 g/dL   Comment: CORRECTED FOR INTERFERING SUBSTANCE     PRE-WARMING TECHNIQUE USED   RDW NOT CALCULATED  11.5 - 15.5 %   Platelets 302  150 - 400 K/uL   Comment: CORRECTED ON 09/26 AT 2117: PREVIOUSLY REPORTED AS 277  DIFFERENTIAL     Status: None   Collection Time    04/28/13  8:17 PM      Result Value Range   Neutrophils Relative % 69  43 - 77 %   Neutro Abs 6.6  1.7 - 7.7 K/uL   Lymphocytes Relative 25  12 - 46 %   Lymphs Abs 2.4  0.7 - 4.0 K/uL   Monocytes Relative 6  3 - 12 %   Monocytes Absolute 0.6  0.1 - 1.0 K/uL   Smear Review MORPHOLOGY UNREMARKABLE    COMPREHENSIVE METABOLIC PANEL     Status: Abnormal   Collection Time    04/28/13  8:17 PM      Result Value Range   Sodium 132 (*) 135 - 145 mEq/L   Potassium 3.9  3.5 - 5.1 mEq/L    Chloride 93 (*) 96 - 112 mEq/L   CO2 25  19 - 32 mEq/L   Glucose, Bld 108 (*) 70 - 99 mg/dL   BUN 27 (*)  6 - 23 mg/dL   Creatinine, Ser 9.60  0.50 - 1.10 mg/dL   Calcium 45.4  8.4 - 09.8 mg/dL   Total Protein 8.7 (*) 6.0 - 8.3 g/dL   Albumin 3.5  3.5 - 5.2 g/dL   AST 38 (*) 0 - 37 U/L   ALT 29  0 - 35 U/L   Alkaline Phosphatase 72  39 - 117 U/L   Total Bilirubin 1.0  0.3 - 1.2 mg/dL   GFR calc non Af Amer 59 (*) >90 mL/min   GFR calc Af Amer 68 (*) >90 mL/min   Comment: (NOTE)     The eGFR has been calculated using the CKD EPI equation.     This calculation has not been validated in all clinical situations.     eGFR's persistently <90 mL/min signify possible Chronic Kidney     Disease.  TROPONIN I     Status: None   Collection Time    04/28/13  8:17 PM      Result Value Range   Troponin I <0.30  <0.30 ng/mL   Comment:            Due to the release kinetics of cTnI,     a negative result within the first hours     of the onset of symptoms does not rule out     myocardial infarction with certainty.     If myocardial infarction is still suspected,     repeat the test at appropriate intervals.  POCT I-STAT TROPONIN I     Status: None   Collection Time    04/28/13  9:14 PM      Result Value Range   Troponin i, poc 0.04  0.00 - 0.08 ng/mL   Comment 3            Comment: Due to the release kinetics of cTnI,     a negative result within the first hours     of the onset of symptoms does not rule out     myocardial infarction with certainty.     If myocardial infarction is still suspected,     repeat the test at appropriate intervals.  GLUCOSE, CAPILLARY     Status: Abnormal   Collection Time    04/28/13  9:53 PM      Result Value Range   Glucose-Capillary 105 (*) 70 - 99 mg/dL  URINE RAPID DRUG SCREEN (HOSP PERFORMED)     Status: Abnormal   Collection Time    04/28/13 10:02 PM      Result Value Range   Opiates NONE DETECTED  NONE DETECTED   Cocaine NONE DETECTED  NONE  DETECTED   Benzodiazepines POSITIVE (*) NONE DETECTED   Amphetamines NONE DETECTED  NONE DETECTED   Tetrahydrocannabinol NONE DETECTED  NONE DETECTED   Barbiturates NONE DETECTED  NONE DETECTED   Comment:            DRUG SCREEN FOR MEDICAL PURPOSES     ONLY.  IF CONFIRMATION IS NEEDED     FOR ANY PURPOSE, NOTIFY LAB     WITHIN 5 DAYS.                LOWEST DETECTABLE LIMITS     FOR URINE DRUG SCREEN     Drug Class       Cutoff (ng/mL)     Amphetamine      1000     Barbiturate      200  Benzodiazepine   200     Tricyclics       300     Opiates          300     Cocaine          300     THC              50  URINALYSIS, ROUTINE W REFLEX MICROSCOPIC     Status: Abnormal   Collection Time    04/28/13 10:02 PM      Result Value Range   Color, Urine AMBER (*) YELLOW   Comment: BIOCHEMICALS MAY BE AFFECTED BY COLOR   APPearance CLOUDY (*) CLEAR   Specific Gravity, Urine 1.035 (*) 1.005 - 1.030   pH 6.0  5.0 - 8.0   Glucose, UA NEGATIVE  NEGATIVE mg/dL   Hgb urine dipstick NEGATIVE  NEGATIVE   Bilirubin Urine MODERATE (*) NEGATIVE   Ketones, ur 40 (*) NEGATIVE mg/dL   Protein, ur 161 (*) NEGATIVE mg/dL   Urobilinogen, UA 1.0  0.0 - 1.0 mg/dL   Nitrite NEGATIVE  NEGATIVE   Leukocytes, UA NEGATIVE  NEGATIVE  URINE MICROSCOPIC-ADD ON     Status: Abnormal   Collection Time    04/28/13 10:02 PM      Result Value Range   Squamous Epithelial / LPF FEW (*) RARE   WBC, UA 0-2  <3 WBC/hpf   RBC / HPF 0-2  <3 RBC/hpf   Crystals CA OXALATE CRYSTALS (*) NEGATIVE   Urine-Other MUCOUS PRESENT      Ct Head Wo Contrast  04/28/2013   CLINICAL DATA:  Altered mental status. Slurred speech.  EXAM: CT HEAD WITHOUT CONTRAST  TECHNIQUE: Contiguous axial images were obtained from the base of the skull through the vertex without intravenous contrast.  COMPARISON:  None.  FINDINGS: The ventricles are normal in configuration. There is ventricular and sulcal enlargement reflecting moderate atrophy. No  hydrocephalus.  No parenchymal masses or mass effect. There is a focal area of hypoattenuation along the anterior superior right base a ganglia consistent with an old lacune infarct. Mild periventricular white matter hypoattenuation is noted consistent with chronic microvascular ischemic change. There is no evidence of a recent transcortical infarct.  There are no extra-axial masses or abnormal fluid collections.  There is no intracranial hemorrhage.  Mild maxillary and moderate ethmoid and inferior right frontal sinus mucosal thickening is noted with mild sphenoid sinus mucosal thickening associated with some dependent secretions. The mastoid air cells are clear.  IMPRESSION: 1. No acute intracranial abnormalities. 2. 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 3. Sinus disease with mucosal thickening involving all sinus cavities.   Electronically Signed   By: Amie Portland   On: 04/28/2013 21:10   Mr Lumbar Spine Wo Contrast  04/28/2013   CLINICAL DATA:  79 year old female with severe low back pain radiating to both lower extremities.  EXAM: MRI LUMBAR SPINE WITHOUT CONTRAST  TECHNIQUE: Multiplanar, multisequence MR imaging was performed. No intravenous contrast was administered.  COMPARISON:  CT Abdomen and Pelvis 04/20/2013. Chest CT 03/30/2013.  FINDINGS: Eleven pairs of ribs demonstrated on 03/30/2013, and absent ribs at T12. By this numbering system, the S1 level is sacralized but there is a full size S1-S2 disc space. The level of mild grade 1 anterolisthesis with prominent vacuum disc phenomena on 04/20/2013 is L4-L5 (identical vacuum phenomena identified today on series 6, image 7). Correlation with radiographs is recommended prior to any operative  intervention.  Stable vertebral height and alignment. Mild anterolisthesis of L4 on L5 measures about 4 mm. No marrow edema or evidence of acute osseous abnormality.  Visualized lower thoracic spinal cord is normal with  conus medularis at L1-L2.  Stable visualized abdominal viscera. Visualized paraspinal soft tissues are within normal limits.  T11-T12: Mild posterior element hypertrophy. No definite stenosis.  T12-L1: Anterior disc osteophyte complex. No stenosis.  L1-L2: Mild disc bulge. No significant stenosis.  L2-L3: Mild circumferential disc bulge. Mild epidural lipomatosis. No significant stenosis.  L3-L4: Mild circumferential disc bulge. Moderate facet and ligament flavum hypertrophy. In conjunction with epidural lipomatosis there is mild to moderate spinal stenosis and right greater than left L3 foraminal stenosis.  L4-L5: Grade 1 spondylolisthesis. Mild to moderate circumferential disc bulge. Moderate to severe facet and moderate ligament flavum hypertrophy. Moderate spinal stenosis (series 7, image 17). Mild to moderate right greater than left L4 foraminal stenosis.  L5-S1: Mild circumferential disc osteophyte complex eccentric to the right. Chronic moderate to severe facet hypertrophy on the right, bilateral facet joint ankylosis suspected and better demonstrated on 04/20/2013. No significant stenosis. There is a small left foraminal annular tear of the disc (series 4, image 11).  S1-S2: Full size disc space. No stenosis. Incidental S2-S3 level small Tarlov cyst.  IMPRESSION: 1. Transitional anatomy, with numbering system on this study confirmed by Previous CT chest and CT abdomen and pelvis. Correlation with radiographs is recommended prior to any operative intervention.  2. Mild grade 1 spondylolisthesis at L4-L5 with disc and facet degeneration resulting in moderate spinal stenosis and mild to moderate right greater than left foraminal stenosis.  3. Multifactorial mild to moderate L3-L4 spinal and right greater than left foraminal stenosis.   Electronically Signed   By: Augusto Gamble M.D.   On: 04/28/2013 14:32     Assessment/Plan: 79 y/o with HTN, hyperlipidemia, and recent onset episodic speech/language dysfunction  associated with episodic confusion. Non focal neuro-exam and CT brain without evidence of acute abnormality. Differential include TIA, cognitive dysfunction with episodic language/speech impairment, and less likely ictal dysphasia. Agree with TIA work and further neurological testing depending on these results. Continue plavix. Will follow up.   Wyatt Portela, MD [facility] [phone]  04/29/2013, 5:54 AM

## 2013-04-30 ENCOUNTER — Telehealth: Payer: Self-pay | Admitting: Internal Medicine

## 2013-04-30 DIAGNOSIS — M549 Dorsalgia, unspecified: Secondary | ICD-10-CM | POA: Diagnosis not present

## 2013-04-30 DIAGNOSIS — R4182 Altered mental status, unspecified: Secondary | ICD-10-CM | POA: Diagnosis not present

## 2013-04-30 DIAGNOSIS — I10 Essential (primary) hypertension: Secondary | ICD-10-CM | POA: Diagnosis not present

## 2013-04-30 DIAGNOSIS — G459 Transient cerebral ischemic attack, unspecified: Secondary | ICD-10-CM | POA: Diagnosis not present

## 2013-04-30 LAB — BASIC METABOLIC PANEL
BUN: 19 mg/dL (ref 6–23)
CO2: 26 mEq/L (ref 19–32)
Calcium: 9.2 mg/dL (ref 8.4–10.5)
Chloride: 97 mEq/L (ref 96–112)
Creatinine, Ser: 0.75 mg/dL (ref 0.50–1.10)
GFR calc Af Amer: >90 mL/min (ref >90)
GFR calc non Af Amer: 78 mL/min — ABNORMAL LOW (ref >90)
Glucose, Bld: 90 mg/dL (ref 70–99)
Potassium: 3.6 mEq/L (ref 3.5–5.1)
Sodium: 133 mEq/L — ABNORMAL LOW (ref 135–145)

## 2013-04-30 LAB — GLUCOSE, CAPILLARY
Glucose-Capillary: 87 mg/dL (ref 70–99)
Glucose-Capillary: 84 mg/dL (ref 70–99)
Glucose-Capillary: 94 mg/dL (ref 70–99)
Glucose-Capillary: 101 mg/dL — ABNORMAL HIGH (ref 70–99)

## 2013-04-30 MED ORDER — IBUPROFEN 600 MG PO TABS
600.0000 mg | ORAL_TABLET | Freq: Four times a day (QID) | ORAL | Status: DC | PRN
  Administered 2013-04-30 – 2013-05-01 (×3): 600 mg via ORAL
  Filled 2013-04-30 (×5): qty 1

## 2013-04-30 MED ORDER — AMLODIPINE BESYLATE 5 MG PO TABS
5.0000 mg | ORAL_TABLET | Freq: Every day | ORAL | Status: DC
  Administered 2013-04-30 – 2013-05-01 (×2): 5 mg via ORAL
  Filled 2013-04-30 (×2): qty 1

## 2013-04-30 MED ORDER — ACETAMINOPHEN 325 MG PO TABS
650.0000 mg | ORAL_TABLET | ORAL | Status: DC | PRN
  Administered 2013-04-30: 650 mg via ORAL
  Filled 2013-04-30: qty 2

## 2013-04-30 NOTE — Progress Notes (Signed)
Subjective: Patient has had no further recurrence of symptoms.  At baseline.  On Plavix.  Objective: Current vital signs: BP 138/64  Pulse 81  Temp(Src) 98.2 F (36.8 C) (Oral)  Resp 20  Ht 5\' 6"  (1.676 m)  Wt 122 kg (268 lb 15.4 oz)  BMI 43.43 kg/m2  SpO2 97% Vital signs in last 24 hours: Temp:  [98 F (36.7 C)-98.4 F (36.9 C)] 98.2 F (36.8 C) (09/28 1417) Pulse Rate:  [72-81] 81 (09/28 1417) Resp:  [18-20] 20 (09/28 1417) BP: (138-153)/(62-79) 138/64 mmHg (09/28 1417) SpO2:  [95 %-97 %] 97 % (09/28 1417)  Intake/Output from previous day: 09/27 0701 - 09/28 0700 In: 403 [P.O.:400; I.V.:3] Out: 625 [Urine:625] Intake/Output this shift: Total I/O In: -  Out: 400 [Urine:400] Nutritional status: Cardiac  Neurologic Exam: Mental Status:  Alert, oriented, thought content appropriate. Speech fluent without evidence of aphasia. Able to follow 3 step commands without difficulty.  Cranial Nerves:  II: Discs flat bilaterally; Visual fields grossly normal, pupils equal, round, reactive to light and accommodation  III,IV, VI: ptosis not present, extra-ocular motions intact bilaterally  V,VII: smile symmetric, facial light touch sensation normal bilaterally  VIII: hearing normal bilaterally  IX,X: gag reflex present  XI: bilateral shoulder shrug  XII: midline tongue extension  Motor:  Lifts all extremities against gravity Sensory: Pinprick and light touch intact throughout, bilaterally  Deep Tendon Reflexes:  2+ throughout Plantars:  Right: downgoing  Left: downgoing   Lab Results: Basic Metabolic Panel:  Recent Labs Lab 04/28/13 2017 04/30/13 0550  NA 132* 133*  K 3.9 3.6  CL 93* 97  CO2 25 26  GLUCOSE 108* 90  BUN 27* 19  CREATININE 0.90 0.75  CALCIUM 10.2 9.2    Liver Function Tests:  Recent Labs Lab 04/28/13 2017  AST 38*  ALT 29  ALKPHOS 72  BILITOT 1.0  PROT 8.7*  ALBUMIN 3.5   No results found for this basename: LIPASE, AMYLASE,  in the  last 168 hours No results found for this basename: AMMONIA,  in the last 168 hours  CBC:  Recent Labs Lab 04/28/13 2017  WBC 9.6  NEUTROABS 6.6  HGB 12.3  HCT 36.5  MCV 86.3  PLT 302    Cardiac Enzymes:  Recent Labs Lab 04/28/13 2017  TROPONINI <0.30    Lipid Panel:  Recent Labs Lab 04/29/13 0516  CHOL 126  TRIG 95  HDL 45  CHOLHDL 2.8  VLDL 19  LDLCALC 62    CBG:  Recent Labs Lab 04/29/13 1148 04/29/13 1729 04/29/13 2044 04/30/13 0728 04/30/13 1205  GLUCAP 81 80 85 87 84    Microbiology: No results found for this or any previous visit.  Coagulation Studies:  Recent Labs  04/28/13 2017  LABPROT 14.7  INR 1.17    Imaging: Ct Head Wo Contrast  04/28/2013   CLINICAL DATA:  Altered mental status. Slurred speech.  EXAM: CT HEAD WITHOUT CONTRAST  TECHNIQUE: Contiguous axial images were obtained from the base of the skull through the vertex without intravenous contrast.  COMPARISON:  None.  FINDINGS: The ventricles are normal in configuration. There is ventricular and sulcal enlargement reflecting moderate atrophy. No hydrocephalus.  No parenchymal masses or mass effect. There is a focal area of hypoattenuation along the anterior superior right base a ganglia consistent with an old lacune infarct. Mild periventricular white matter hypoattenuation is noted consistent with chronic microvascular ischemic change. There is no evidence of a recent transcortical infarct.  There  are no extra-axial masses or abnormal fluid collections.  There is no intracranial hemorrhage.  Mild maxillary and moderate ethmoid and inferior right frontal sinus mucosal thickening is noted with mild sphenoid sinus mucosal thickening associated with some dependent secretions. The mastoid air cells are clear.  IMPRESSION: 1. No acute intracranial abnormalities. 2. 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 3. Sinus disease with mucosal thickening  involving all sinus cavities.   Electronically Signed   By: Amie Portland   On: 04/28/2013 21:10   Mr Natasha Ellis Head Wo Contrast  04/29/2013   CLINICAL DATA:  79 year old female with pain. Transient ischemic attack. Altered mental status.  EXAM: MRI HEAD WITHOUT CONTRAST  MRA HEAD WITHOUT CONTRAST  TECHNIQUE: Multiplanar, multiecho pulse sequences of the brain and surrounding structures were obtained without intravenous contrast. Angiographic images of the head were obtained using MRA technique without contrast.  COMPARISON:  Head CT without contrast 04/28/2013.  FINDINGS: MRI HEAD FINDINGS  No restricted diffusion to suggest acute infarction. No midline shift, mass effect, evidence of mass lesion, ventriculomegaly, extra-axial collection or acute intracranial hemorrhage. Cervicomedullary junction and pituitary are within normal limits. Negative visualized cervical spine. Major intracranial vascular flow voids are preserved.  Small chronic lacunar infarct right caudate nucleus. Superimposed small chronic cortically based infarcts in the right superior frontal gyrus (series 5, image 23). Elsewhere only mild nonspecific periventricular white matter T2 and FLAIR hyperintensity. Negative brainstem and cerebellum.  Postoperative changes to the globes. Paranasal sinus inflammatory changes, favor a component of chronic sinusitis. Mastoids are clear and visualized internal auditory structures appear normal. Negative scalp soft tissues. Normal bone marrow signal.  MRA HEAD FINDINGS  Antegrade flow in the posterior circulation with codominant distal vertebral arteries. Normal PICA origins. Patent vertebrobasilar junction with mild fenestration. Tortuous basilar artery with suspected atherosclerosis but no stenosis. Motion artifact at the basilar tip. SCA and right PCA origins are normal. Fetal type left PCA origin. Bilateral PCA branches within normal limits. Right posterior communicating artery diminutive or absent.  Tortuous  distal cervical ICAs. Antegrade flow in both ICA siphons. No ICA stenosis. Normal left posterior communicating artery origin. Patent carotid termini.  Mild motion artifact at the carotid termini. MCA and ACA origins within normal limits. Mildly dominant right ACA A1 segment. Anterior communicating artery within normal limits. Degraded detail of bilateral MCA and ACA branches due to motion. Visualized bilateral MCA and ACA branches are grossly normal.  IMPRESSION: MRI HEAD IMPRESSION  1. No acute intracranial abnormality.  2.  Chronic right MCA territory ischemia.  3. Paranasal sinus inflammatory changes, likely chronic to a degree.  MRA HEAD IMPRESSION  Mild for age intracranial atherosclerosis. Motion artifact affecting detail of the MCA and ACA branches. No intracranial stenosis or major branch occlusion identified.   Electronically Signed   By: Augusto Gamble M.D.   On: 04/29/2013 11:48   Mri Brain Without Contrast  04/29/2013   CLINICAL DATA:  79 year old female with pain. Transient ischemic attack. Altered mental status.  EXAM: MRI HEAD WITHOUT CONTRAST  MRA HEAD WITHOUT CONTRAST  TECHNIQUE: Multiplanar, multiecho pulse sequences of the brain and surrounding structures were obtained without intravenous contrast. Angiographic images of the head were obtained using MRA technique without contrast.  COMPARISON:  Head CT without contrast 04/28/2013.  FINDINGS: MRI HEAD FINDINGS  No restricted diffusion to suggest acute infarction. No midline shift, mass effect, evidence of mass lesion, ventriculomegaly, extra-axial collection or acute intracranial hemorrhage. Cervicomedullary junction and pituitary are within  normal limits. Negative visualized cervical spine. Major intracranial vascular flow voids are preserved.  Small chronic lacunar infarct right caudate nucleus. Superimposed small chronic cortically based infarcts in the right superior frontal gyrus (series 5, image 23). Elsewhere only mild nonspecific  periventricular white matter T2 and FLAIR hyperintensity. Negative brainstem and cerebellum.  Postoperative changes to the globes. Paranasal sinus inflammatory changes, favor a component of chronic sinusitis. Mastoids are clear and visualized internal auditory structures appear normal. Negative scalp soft tissues. Normal bone marrow signal.  MRA HEAD FINDINGS  Antegrade flow in the posterior circulation with codominant distal vertebral arteries. Normal PICA origins. Patent vertebrobasilar junction with mild fenestration. Tortuous basilar artery with suspected atherosclerosis but no stenosis. Motion artifact at the basilar tip. SCA and right PCA origins are normal. Fetal type left PCA origin. Bilateral PCA branches within normal limits. Right posterior communicating artery diminutive or absent.  Tortuous distal cervical ICAs. Antegrade flow in both ICA siphons. No ICA stenosis. Normal left posterior communicating artery origin. Patent carotid termini.  Mild motion artifact at the carotid termini. MCA and ACA origins within normal limits. Mildly dominant right ACA A1 segment. Anterior communicating artery within normal limits. Degraded detail of bilateral MCA and ACA branches due to motion. Visualized bilateral MCA and ACA branches are grossly normal.  IMPRESSION: MRI HEAD IMPRESSION  1. No acute intracranial abnormality.  2.  Chronic right MCA territory ischemia.  3. Paranasal sinus inflammatory changes, likely chronic to a degree.  MRA HEAD IMPRESSION  Mild for age intracranial atherosclerosis. Motion artifact affecting detail of the MCA and ACA branches. No intracranial stenosis or major branch occlusion identified.   Electronically Signed   By: Augusto Gamble M.D.   On: 04/29/2013 11:48    Medications:  I have reviewed the patient's current medications. Scheduled: . amLODipine  5 mg Oral Daily  . budesonide-formoterol  2 puff Inhalation BID  . clopidogrel  75 mg Oral Q breakfast  . fluticasone  1 spray Each  Nare Daily  . furosemide  20 mg Oral Daily  . heparin  5,000 Units Subcutaneous Q8H  . pantoprazole  40 mg Oral Q1200  . potassium chloride  20 mEq Oral Daily  . sodium chloride  3 mL Intravenous Q12H    Assessment/Plan: Patient at baseline.  MRI of the brain reviewed and shows no acute changes.  TIA suspected.  Patient now on Plavix and tolerating well.  Carotid doppler shows no hemodynamically significant stenosis.  Echocardiogram is pending.    Recommendations: 1.  Continue Plavix 2.  Will follow up results of echocardiogram 3.  Therapy    LOS: 2 days   Thana Farr, MD [facility] [phone] 04/30/2013  3:14 PM

## 2013-04-30 NOTE — Progress Notes (Signed)
[facility] PROGRESS NOTE  Natasha Ellis ZOX:[id number] DOB: 1931-01-24 DOA: 04/28/2013 PCP: Ginette Otto, MD  Assessment/Plan: #1 altered mental status/probable TIA Patient had presented with an expressive aphasia. Clinical improvement. CT head negative. MRI of the head negative. Carotid Dopplers with no significant ICA stenosis preliminary readings. 2-D echo is pending. Patient was changed from aspirin to Plavix. Continue Plavix for secondary stroke prevention. PT/OT. Risk factor modification.  #2 hypertension Continue home regimen Lasix. Start low-dose Norvasc 5 mg daily.  #3 hyperlipidemia LDL is 62. Follow.  #4 old CVA Seen on CT an MRI of the head. Patient has been switched to Plavix. Continue risk factor modification.  #5 gastroesophageal reflux disease PPI.  #6 obstructive sleep apnea on CPAP CPAP each bedtime.  #7 back pain Continue ibuprofen and Tylenol as needed. PT/OT.  #8 asthma Stable. Continue Symbicort.  #9 prophylaxis Heparin for DVT prophylaxis. Protonix for GI prophylaxis.   Code Status: Full Family Communication: Updated patient, daughter, husband at bedside. Disposition Plan: Skilled nursing facility when medically stable.   Consultants:  Neurology: Dr. Cyril Mourning 04/29/2013  Procedures:  2-D echo 04/29/2013  Carotid Dopplers 04/29/2013  MRI head 04/29/2013  CT head 04/28/2013  MRI of the L-spine 04/28/2013  Antibiotics:  None  HPI/Subjective: Patient is more alert. Following commands. Per family improvement in mental status.  Objective: Filed Vitals:   04/30/13 0959  BP: 150/62  Pulse: 73  Temp: 98.4 F (36.9 C)  Resp: 19    Intake/Output Summary (Last 24 hours) at 04/30/13 1338 Last data filed at 04/30/13 1200  Gross per 24 hour  Intake    403 ml  Output    850 ml  Net   -447 ml   Filed Weights   04/28/13 2012 04/29/13 1430  Weight: 127.914 kg (282 lb) 122 kg (268 lb 15.4 oz)    Exam:   General:   NAD  Cardiovascular: RRR  Respiratory: CTAB  Abdomen: Soft/NT/ND/+BS  Musculoskeletal: 4/5 bue STRENGTH, 4/5 ble STRENGTH  Data Reviewed: Basic Metabolic Panel:  Recent Labs Lab 04/28/13 2017 04/30/13 0550  NA 132* 133*  K 3.9 3.6  CL 93* 97  CO2 25 26  GLUCOSE 108* 90  BUN 27* 19  CREATININE 0.90 0.75  CALCIUM 10.2 9.2   Liver Function Tests:  Recent Labs Lab 04/28/13 2017  AST 38*  ALT 29  ALKPHOS 72  BILITOT 1.0  PROT 8.7*  ALBUMIN 3.5   No results found for this basename: LIPASE, AMYLASE,  in the last 168 hours No results found for this basename: AMMONIA,  in the last 168 hours CBC:  Recent Labs Lab 04/28/13 2017  WBC 9.6  NEUTROABS 6.6  HGB 12.3  HCT 36.5  MCV 86.3  PLT 302   Cardiac Enzymes:  Recent Labs Lab 04/28/13 2017  TROPONINI <0.30   BNP (last 3 results)  Recent Labs  03/30/13 1631  PROBNP 70.0   CBG:  Recent Labs Lab 04/29/13 1148 04/29/13 1729 04/29/13 2044 04/30/13 0728 04/30/13 1205  GLUCAP 81 80 85 87 84    No results found for this or any previous visit (from the past 240 hour(s)).   Studies: Ct Head Wo Contrast  04/28/2013   CLINICAL DATA:  Altered mental status. Slurred speech.  EXAM: CT HEAD WITHOUT CONTRAST  TECHNIQUE: Contiguous axial images were obtained from the base of the skull through the vertex without intravenous contrast.  COMPARISON:  None.  FINDINGS: The ventricles are normal in configuration. There is ventricular and  sulcal enlargement reflecting moderate atrophy. No hydrocephalus.  No parenchymal masses or mass effect. There is a focal area of hypoattenuation along the anterior superior right base a ganglia consistent with an old lacune infarct. Mild periventricular white matter hypoattenuation is noted consistent with chronic microvascular ischemic change. There is no evidence of a recent transcortical infarct.  There are no extra-axial masses or abnormal fluid collections.  There is no intracranial  hemorrhage.  Mild maxillary and moderate ethmoid and inferior right frontal sinus mucosal thickening is noted with mild sphenoid sinus mucosal thickening associated with some dependent secretions. The mastoid air cells are clear.  IMPRESSION: 1. No acute intracranial abnormalities. 2. 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 3. Sinus disease with mucosal thickening involving all sinus cavities.   Electronically Signed   By: Amie Portland   On: 04/28/2013 21:10   Mr Maxine Glenn Head Wo Contrast  04/29/2013   CLINICAL DATA:  79 year old female with pain. Transient ischemic attack. Altered mental status.  EXAM: MRI HEAD WITHOUT CONTRAST  MRA HEAD WITHOUT CONTRAST  TECHNIQUE: Multiplanar, multiecho pulse sequences of the brain and surrounding structures were obtained without intravenous contrast. Angiographic images of the head were obtained using MRA technique without contrast.  COMPARISON:  Head CT without contrast 04/28/2013.  FINDINGS: MRI HEAD FINDINGS  No restricted diffusion to suggest acute infarction. No midline shift, mass effect, evidence of mass lesion, ventriculomegaly, extra-axial collection or acute intracranial hemorrhage. Cervicomedullary junction and pituitary are within normal limits. Negative visualized cervical spine. Major intracranial vascular flow voids are preserved.  Small chronic lacunar infarct right caudate nucleus. Superimposed small chronic cortically based infarcts in the right superior frontal gyrus (series 5, image 23). Elsewhere only mild nonspecific periventricular white matter T2 and FLAIR hyperintensity. Negative brainstem and cerebellum.  Postoperative changes to the globes. Paranasal sinus inflammatory changes, favor a component of chronic sinusitis. Mastoids are clear and visualized internal auditory structures appear normal. Negative scalp soft tissues. Normal bone marrow signal.  MRA HEAD FINDINGS  Antegrade flow in the posterior circulation  with codominant distal vertebral arteries. Normal PICA origins. Patent vertebrobasilar junction with mild fenestration. Tortuous basilar artery with suspected atherosclerosis but no stenosis. Motion artifact at the basilar tip. SCA and right PCA origins are normal. Fetal type left PCA origin. Bilateral PCA branches within normal limits. Right posterior communicating artery diminutive or absent.  Tortuous distal cervical ICAs. Antegrade flow in both ICA siphons. No ICA stenosis. Normal left posterior communicating artery origin. Patent carotid termini.  Mild motion artifact at the carotid termini. MCA and ACA origins within normal limits. Mildly dominant right ACA A1 segment. Anterior communicating artery within normal limits. Degraded detail of bilateral MCA and ACA branches due to motion. Visualized bilateral MCA and ACA branches are grossly normal.  IMPRESSION: MRI HEAD IMPRESSION  1. No acute intracranial abnormality.  2.  Chronic right MCA territory ischemia.  3. Paranasal sinus inflammatory changes, likely chronic to a degree.  MRA HEAD IMPRESSION  Mild for age intracranial atherosclerosis. Motion artifact affecting detail of the MCA and ACA branches. No intracranial stenosis or major branch occlusion identified.   Electronically Signed   By: Augusto Gamble M.D.   On: 04/29/2013 11:48   Mri Brain Without Contrast  04/29/2013   CLINICAL DATA:  79 year old female with pain. Transient ischemic attack. Altered mental status.  EXAM: MRI HEAD WITHOUT CONTRAST  MRA HEAD WITHOUT CONTRAST  TECHNIQUE: Multiplanar, multiecho pulse sequences of the brain and surrounding structures were  obtained without intravenous contrast. Angiographic images of the head were obtained using MRA technique without contrast.  COMPARISON:  Head CT without contrast 04/28/2013.  FINDINGS: MRI HEAD FINDINGS  No restricted diffusion to suggest acute infarction. No midline shift, mass effect, evidence of mass lesion, ventriculomegaly, extra-axial  collection or acute intracranial hemorrhage. Cervicomedullary junction and pituitary are within normal limits. Negative visualized cervical spine. Major intracranial vascular flow voids are preserved.  Small chronic lacunar infarct right caudate nucleus. Superimposed small chronic cortically based infarcts in the right superior frontal gyrus (series 5, image 23). Elsewhere only mild nonspecific periventricular white matter T2 and FLAIR hyperintensity. Negative brainstem and cerebellum.  Postoperative changes to the globes. Paranasal sinus inflammatory changes, favor a component of chronic sinusitis. Mastoids are clear and visualized internal auditory structures appear normal. Negative scalp soft tissues. Normal bone marrow signal.  MRA HEAD FINDINGS  Antegrade flow in the posterior circulation with codominant distal vertebral arteries. Normal PICA origins. Patent vertebrobasilar junction with mild fenestration. Tortuous basilar artery with suspected atherosclerosis but no stenosis. Motion artifact at the basilar tip. SCA and right PCA origins are normal. Fetal type left PCA origin. Bilateral PCA branches within normal limits. Right posterior communicating artery diminutive or absent.  Tortuous distal cervical ICAs. Antegrade flow in both ICA siphons. No ICA stenosis. Normal left posterior communicating artery origin. Patent carotid termini.  Mild motion artifact at the carotid termini. MCA and ACA origins within normal limits. Mildly dominant right ACA A1 segment. Anterior communicating artery within normal limits. Degraded detail of bilateral MCA and ACA branches due to motion. Visualized bilateral MCA and ACA branches are grossly normal.  IMPRESSION: MRI HEAD IMPRESSION  1. No acute intracranial abnormality.  2.  Chronic right MCA territory ischemia.  3. Paranasal sinus inflammatory changes, likely chronic to a degree.  MRA HEAD IMPRESSION  Mild for age intracranial atherosclerosis. Motion artifact affecting  detail of the MCA and ACA branches. No intracranial stenosis or major branch occlusion identified.   Electronically Signed   By: Augusto Gamble M.D.   On: 04/29/2013 11:48    Scheduled Meds: . budesonide-formoterol  2 puff Inhalation BID  . clopidogrel  75 mg Oral Q breakfast  . fluticasone  1 spray Each Nare Daily  . furosemide  20 mg Oral Daily  . heparin  5,000 Units Subcutaneous Q8H  . pantoprazole  40 mg Oral Q1200  . potassium chloride  20 mEq Oral Daily  . sodium chloride  3 mL Intravenous Q12H   Continuous Infusions:   Principal Problem:   TIA (transient ischemic attack) Active Problems:   HTN (hypertension)   HLD (hyperlipidemia)   Lacunar infarction   OSA on CPAP   GERD (gastroesophageal reflux disease)   Back pain   Asthma    Time spent: > 35 mins    [facility]  [facility] Pager [phone]. If 7PM-7AM, please contact night-coverage at [email or web address], password [facility] 04/30/2013, 1:38 PM  LOS: 2 days

## 2013-04-30 NOTE — Progress Notes (Signed)
Clinical Social Work Department BRIEF PSYCHOSOCIAL ASSESSMENT 04/30/2013  Patient:  Natasha Ellis, Natasha Ellis     Account Number:  [id number]     Admit date:  04/28/2013  Clinical Social Worker:  Doroteo Glassman  Date/Time:  04/30/2013 03:56 PM  Referred by:  Physician  Date Referred:  04/30/2013 Referred for  SNF Placement   Other Referral:   Interview type:  Patient Other interview type:   Pt's daughter and husband at bedside    PSYCHOSOCIAL DATA Living Status:  HUSBAND Admitted from facility:   Level of care:   Primary support name:  Kearney Hard Primary support relationship to patient:  SPOUSE Degree of support available:   strong    CURRENT CONCERNS Current Concerns  Post-Acute Placement   Other Concerns:    SOCIAL WORK ASSESSMENT / PLAN Met with Pt to discuss d/c plan.  Pt lives at the independent living section of [facility].    Discussed with Pt PT's recommendation and how her OBS status affects d/c to SNF.  Educated Pt and family about OBS and SNF and answered questions.    Pt unsure that she wants to pay out-of-pocket for SNF and is interested to see what's available to her in the home.    CSW thanked Pt and her family for their time.    Notified RNCM.   Assessment/plan status:  Psychosocial Support/Ongoing Assessment of Needs Other assessment/ plan:   Information/referral to community resources:   n/a--Pt not interested in SNF, at this time.    PATIENT'S/FAMILY'S RESPONSE TO PLAN OF CARE: Pt and family expressed frustration regarding Pt's OBV status, however they voiced an understanding of [facility]'s policies.    Pt and family grateful to CSW for explaining the OBV status and the SNF process.    Pt and family to talk with RNCM before making a d/c decision.    Pt and family thanked CSW for time and assistance.   Providence Crosby, LCSWA Clinical Social Work [phone]

## 2013-04-30 NOTE — Care Management Note (Signed)
Cm spoke with patient at bedside with daughter and spouse present. PT recommendation for SNF. Pt admitted as OBS. Pt explained OBS admission status by CSW and CM. Pt voiced understanding of SNF being an out-of-pocket expense due to OBS status during course of hospital stay.Cm provided pt with information concerning HH services and private duty care to assist in making informed decision concerning discharge disposition. MD awaiting results of 2 D Echo prior to discharging patient.   Roxy Manns Keaston Pile,RN,MSN [phone]

## 2013-04-30 NOTE — Telephone Encounter (Signed)
Dr Frederica Kuster. THat was very thorough. I am mighty impressed. Thanks a lot. YEs, agree with your thoughts. No need for o2 for the very reason you stated.  Regarding her dyspnea: Short of a bike pulmonary stress test which she cannot do she has been investigated extensively for her dyspnea. It is my strong conclusion that it is DIASTOLIC DYSFUNCTION due to obesity, BP and OBesity and Deconditioning that is causing her dyspnea. He resting echo showed grade 2 diastolic dysfunction. WE see this a lot with our dyspnea workup and bike pulmonary stress test graphs confirm this. Literature is now talking about this as an important yet highly under-appreciated etiology for dyspnea. Patient often hear their heart is fine (which means no CAD or systolic CHF) and they have a hard time accepting this diastolic dysfunction when it comes from a pulmonary guy. I have asked her to come in so I Can explain this. Rx will include weight loss, bp control, ? Lasix and I will talk to Dr Garnette Scheuermann on this regard as well  THanks  Natasha Ellis ===View-only below this line===  ----- Message -----    From: Darnelle Bos, MD    Sent: 04/27/2013   8:13 AM      To: Kalman Shan, MD Subject: very mild hypoxemia on CPAP                    April 27, 2013  Dear Dr. Bonney Aid,  Natasha Ellis referred Natasha Ellis to me for sleep evaluation after your recommendation which was made after her nocturnal home oximetry of 03/02/13 implied severe hypoxemia on CPAP.  I have evaluated Natasha Ellis and done several tests. Below is a bullet point outline of her situation and test results. It is my conclusion that she does not need oxygen in addition to her CPAP, but I would appreciate your opinion if it is to the contrary.   2006: OSA diagnosis in [place] - we do not have those records     Subsequently developed severe pneumonia and was on home O2 for 24 hrs/day.       Improved and continued on nocturnal oxygen although no one  proved she really needed it  Late 2013: Moved to [place]      Oxygen discontinued but CPAP continued  Early 2014: developed cough; referred to you by Dr. Pete Glatter  12/30/12: CT scan of chest showed extensive micro and macronodularity throughout the lungs, concerning for atypical infectious process  02/02/13: nocturnal oximetry done at home      Patient states she was on CPAP      Sustained hypoxemia < 88%      Two periods at 1 hour and at 4 hours with O2 sats to 60's. ? REM periods  03/02/13: I saw patient and did a download of her CPAP       She is using auto adjusting CPAP (pressure range 8-15, 95Th pressure 12.5), AHI 3.0, Use 100% of nights, 10:13 hours/night   03/23/13: SPLIT night study in our lab to re-establish dx of OSA and to       confirm hypoxemia on CPAP         Unfortunately, tech did not allow adequate sleep time to establish dx of OSA per Medicare regulations, but her AHI was 11         For some reason, CPAP did not abolish her OSA adequately that night and it was an incomplete titration. Despite inadequate airway control, her O2  minimum was 84%  03/30/13: CT scan of chest showed improvement in her micro and           macronodularity  04/07/13: Home sleep test not on oxygen showed ,ild OSA (AHI 13/hr);        O2 min 81% with 35% of night < 89%  9/8 and 04/11/13: oximetry done with patient on CPAP; oximeter hooked     directly into her ResMed CPAP machine        04/10/13: Use 10:11 hours; AHI 2.4; ODI 7; O2 min 86%; time below 88% was 2 min and 12 seconds        04/11/13: Use 10:32 hours; AHI 1.8; ODI 5; O2 min 84%; time below 88% was 12 min and 14 seconds.   Based on all of this, I think her oximetry done in early July overstated her hypoxemia due to co-existent pulmonary process that is better. Now, based on two nights of oximetry done on CPAP on 04/10/13 and 04/11/13, I do not think there is enough hypoxemia on CPAP to warrant oxygen (one night with 2 minutes <88% and one night with 12  minutes <88%)  I would very much appreciate it if you would let me know if you think her persisting hypoxemia on CPAP requires supplemental oxygen. If so, we will proceed with a CPAP titration in the lab once again and hope that we get an adequate study.   I am aware that she has been in touch with you as recently as yesterday concerning her continuing dyspnea on exertion.   Thank you in advance for looking through all of this material. Feel free to call me at [phone] or respond through [facility] messaging. I do look at [facility] daily in hospital and I will see your response.  Thanks again.  Benjaman Kindler.

## 2013-05-01 DIAGNOSIS — R4182 Altered mental status, unspecified: Secondary | ICD-10-CM | POA: Diagnosis not present

## 2013-05-01 DIAGNOSIS — M549 Dorsalgia, unspecified: Secondary | ICD-10-CM | POA: Diagnosis not present

## 2013-05-01 DIAGNOSIS — G459 Transient cerebral ischemic attack, unspecified: Secondary | ICD-10-CM | POA: Diagnosis not present

## 2013-05-01 DIAGNOSIS — G4733 Obstructive sleep apnea (adult) (pediatric): Secondary | ICD-10-CM | POA: Diagnosis not present

## 2013-05-01 DIAGNOSIS — I10 Essential (primary) hypertension: Secondary | ICD-10-CM | POA: Diagnosis not present

## 2013-05-01 LAB — BASIC METABOLIC PANEL
BUN: 17 mg/dL (ref 6–23)
CO2: 24 mEq/L (ref 19–32)
Calcium: 9.5 mg/dL (ref 8.4–10.5)
Chloride: 96 mEq/L (ref 96–112)
Creatinine, Ser: 0.75 mg/dL (ref 0.50–1.10)
GFR calc Af Amer: >90 mL/min (ref >90)
GFR calc non Af Amer: 78 mL/min — ABNORMAL LOW (ref >90)
Glucose, Bld: 99 mg/dL (ref 70–99)
Potassium: 3.4 mEq/L — ABNORMAL LOW (ref 3.5–5.1)
Sodium: 133 mEq/L — ABNORMAL LOW (ref 135–145)

## 2013-05-01 LAB — GLUCOSE, CAPILLARY
Glucose-Capillary: 115 mg/dL — ABNORMAL HIGH (ref 70–99)
Glucose-Capillary: 93 mg/dL (ref 70–99)

## 2013-05-01 LAB — CLOSTRIDIUM DIFFICILE BY PCR: Toxigenic C. Difficile by PCR: NEGATIVE

## 2013-05-01 MED ORDER — CLOPIDOGREL BISULFATE 75 MG PO TABS: 75.0000 mg | ORAL_TABLET | Freq: Every day | ORAL | Status: DC

## 2013-05-01 MED ORDER — LOPERAMIDE HCL 2 MG PO CAPS: 2.0000 mg | ORAL_CAPSULE | Freq: Four times a day (QID) | ORAL | Status: DC | PRN

## 2013-05-01 MED ORDER — PANTOPRAZOLE SODIUM 40 MG PO TBEC: 40.0000 mg | DELAYED_RELEASE_TABLET | Freq: Every day | ORAL | Status: DC

## 2013-05-01 MED ORDER — AMLODIPINE BESYLATE 5 MG PO TABS: 5.0000 mg | ORAL_TABLET | Freq: Every day | ORAL | Status: DC

## 2013-05-01 NOTE — Clinical Social Work Placement (Signed)
     Clinical Social Work Department CLINICAL SOCIAL WORK PLACEMENT NOTE 05/01/2013  Patient:  Natasha Ellis, Natasha Ellis  Account Number:  [id number] Admit date:  04/28/2013  Clinical Social Worker:  Becky Sax, LCSW  Date/time:  05/01/2013 12:00 M  Clinical Social Work is seeking post-discharge placement for this patient at the following level of care:   SKILLED NURSING   (*CSW will update this form in Epic as items are completed)   05/01/2013  Patient/family provided with [facility] Department of Clinical Social Works list of facilities offering this level of care within the geographic area requested by the patient (or if unable, by the patients family).  05/01/2013  Patient/family informed of their freedom to choose among providers that offer the needed level of care, that participate in Medicare, Medicaid or managed care program needed by the patient, have an available bed and are willing to accept the patient.  05/01/2013  Patient/family informed of [facility] ownership interest in [facility], as well as of the fact that they are under no obligation to receive care at this facility.  PASARR submitted to EDS on 05/01/2013 PASARR number received from EDS on 05/01/2013  FL2 transmitted to all facilities in geographic area requested by pt/family on  05/01/2013 FL2 transmitted to all facilities within larger geographic area on   Patient informed that his/her managed care company has contracts with or will negotiate with  certain facilities, including the following:     Patient/family informed of bed offers received:  05/01/2013 Patient chooses bed at [facility] Physician recommends and patient chooses bed at    Patient to be transferred to [facility] on  05/01/2013 Patient to be transferred to facility by ptar  The following physician request were entered in Epic:   Additional Comments:

## 2013-05-01 NOTE — Care Management Note (Addendum)
    Page 1 of 2   05/01/2013     4:32:12 PM   CARE MANAGEMENT NOTE 05/01/2013  Patient:  Natasha Ellis, Natasha Ellis   Account Number:  [id number]  Date Initiated:  04/30/2013  Documentation initiated by:  DAVIS,TYMEEKA  Subjective/Objective Assessment:   79 yo female admitted with TIA. PCP: Ginette Otto, MD . Pt lives with spouse at [facility].     Action/Plan:   Home when stable   Anticipated DC Date:  05/01/2013   Anticipated DC Plan:  HOME W HOME HEALTH SERVICES  In-house referral  Clinical Social Worker      DC Planning Services  CM consult      Choice offered to / List presented to:  C-4 Adult Children        HH arranged  HH-1 RN  HH-2 PT  HH-3 OT  HH-4 NURSE'S AIDE  HH-6 SOCIAL WORKER      HH agency  [facility]   Status of service:  Completed, signed off Medicare Important Message given?   (If response is "NO", the following Medicare IM given date fields will be blank) Date Medicare IM given:   Date Additional Medicare IM given:    Discharge Disposition:  HOME W HOME HEALTH SERVICES  Per UR Regulation:  Reviewed for med. necessity/level of care/duration of stay  If discussed at Long Length of Stay Meetings, dates discussed:    Comments:  05/01/13 Katyra Tomassetti RN,BSN NCM [phone] PLEASE SEE CSW NOTE IN REFERNCE TO CONVERSATION W/DTR.MD NOTIFIED.[facility] NOTIFIED. SPOKE TO DTR ABOUT HHC AGENCY OF CHOICE.SINCE PATIENT RESIDES @[facility], DTR WANTS WHOMEVER THE FACILITY USES.TC [facility] COORDINATOR Ladell Pier TEL#[id number]/AVW#[phone]-THEY CONTRACT W/[facility]-FAXED HH ORDERS,F2F,H&P,FACE SHEET W/CONFIRMATION.DTR LINDA HESTER WANTED SCOTT @ [facility] TO CALL HER,SO I PROVIDED SCOTT W/HER C#.HE VOICED UNDERSTANDING.OT-3N1.MD NOTIFIED OF HOME 3N1 ORDER NEEDED SINCE PATIENT DID NOT HAVE ONE.PATIENT DOES ALREADY HAVE A RW.  Sharmon Leyden, RN Registered Nurse Signed CASE MANAGEMENT Care Management  Note Service date: 04/30/2013 4:17 PM Cm spoke with patient at bedside with daughter and spouse present. PT recommendation for SNF. Pt admitted as OBS. Pt explained OBS admission status by CSW and CM. Pt voiced understanding of SNF being an out-of-pocket expense due to OBS status during course of hospital stay.Cm provided pt with information concerning HH services and private duty care to assist in making informed decision concerning discharge disposition. MD awaiting results of 2 D Echo prior to discharging patient.

## 2013-05-01 NOTE — Progress Notes (Signed)
Physical Therapy Treatment Patient Details Name: Natasha Ellis MRN: [id number] DOB: August 15, 1930 Today's Date: 05/01/2013 Time: 6962-9528 PT Time Calculation (min): 27 min  PT Assessment / Plan / Recommendation  History of Present Illness     PT Comments   Pt OOB in recliner.  Amb to BR and assisted with hygiene.  Amb to recliner as pt required a sitting rest break before attempting amb in hallway. Pt demon limited activity level and fatigues quickly.  Pt also demon slight expressive aphasia (lost for words) but is pleasant and motivated.  Pt amb in hallway with c/o mild SOB.   Follow Up Recommendations  SNF     Does the patient have the potential to tolerate intense rehabilitation     Barriers to Discharge        Equipment Recommendations       Recommendations for Other Services    Frequency Min 3X/week   Progress towards PT Goals Progress towards PT goals: Progressing toward goals  Plan      Precautions / Restrictions Precautions Precautions: Fall Restrictions Weight Bearing Restrictions: No    Pertinent Vitals/Pain C/o "some" back pain    Mobility  Bed Mobility Bed Mobility: Not assessed Details for Bed Mobility Assistance: Pt OOB in recliner Transfers Transfers: Sit to Stand;Stand to Sit Sit to Stand: 3: Mod assist;With upper extremity assist;From chair/3-in-1 Stand to Sit: 3: Mod assist;With upper extremity assist;To chair/3-in-1 Details for Transfer Assistance: pt tends to use momentum to perform sit to stand.  Pt required 25% VC's on proper tech and hand placement.  Pt also requires time. Ambulation/Gait Ambulation/Gait Assistance: 4: Min assist Ambulation Distance (Feet): 50 Feet (25' x 2) Assistive device: Rolling walker Ambulation/Gait Assistance Details: Pt fatigues quickly and requires rest break between.  Pt also demon 2/4 DOE.  Amb to and from BR then required sitting rest break.  Amb in hallway.   Gait Pattern: Step-to pattern;Decreased stance time -  right Gait velocity: "I don't trust my right knee'     PT Goals (current goals can now be found in the care plan section)    Visit Information  Last PT Received On: 05/01/13    Subjective Data      Cognition       Balance     End of Session PT - End of Session Equipment Utilized During Treatment: Gait belt Activity Tolerance: Patient tolerated treatment well Patient left: in chair;with call bell/phone within reach;with chair alarm set   Felecia Shelling  PTA [facility]  Acute  Rehab Pager      [phone]

## 2013-05-01 NOTE — Progress Notes (Signed)
Occupational Therapy Treatment Patient Details Name: Natasha Ellis MRN: [id number] DOB: [birth year] Today's Date: 05/01/2013 Time: 4098-1191 OT Time Calculation (min): 24 min  OT Assessment / Plan / Recommendation  History of present illness from H&P 9/27: "Natasha Ellis is a 79 y.o. 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 Patient reports that for the last week she had been very restless do to acute lower back pain and that she has found some difficulty finding words. According to the daughter who was at bedside patient was also having intermittent episodes of AMS, confusion and inability to to her usual ADL's. Patient has been recently started on Valium to help with her lower back pain and muscle spasms. Patient denies any chest pain, shortness of breath, fever, chills, nausea/vomiting, abdominal pain, melena, hematochezia or any other acute complaints. Patient was last seen normal around 7:30 PM on 04/27/13. Given to that her symptoms were intermittent no medical attention was seek until 9/26 evening. In the ED patient CT scan did not reveal any acute intracranial abnormalities, but demonstrated wall lacunar infarction. Due to the concerns for TIA/stroke like symptoms [facility] hospitalist has been called to admit the patient for further evaluation and treatment.   OT comments  Pt states she is fatigued and didn't sleep well last night. She still requires a moderate amount of assist for functional transfers to the [facility] and mod verbal cues for safety with RW as she tends to push it too far in front of her. 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   Follow Up Recommendations  SNF;Supervision/Assistance - 24 hour    Barriers to Discharge       Equipment  Recommendations  3 in 1 bedside comode    Recommendations for Other Services    Frequency Min 2X/week   Progress towards OT Goals Progress towards OT goals: Progressing toward goals  Plan Discharge plan remains appropriate    Precautions / Restrictions Precautions Precautions: Fall Restrictions Weight Bearing Restrictions: No   Pertinent Vitals/Pain 7/10 headache. Nursing brought pain meds    ADL  Toilet Transfer: Performed;Moderate assistance Toilet Transfer Method: Sit to stand;Stand pivot Toilet Transfer Equipment: Bedside commode Toileting - Clothing Manipulation and Hygiene: Performed;Minimal assistance Where Assessed - Toileting Clothing Manipulation and Hygiene: Sit to stand from 3-in-1 or toilet Equipment Used: Rolling walker ADL Comments: Pt is very unsafe with RW despite repeated verbal cues to stay close to walker. She tends to push it too far in front and steps outside of walker. She also needs increased verbal cues for hand placement wtih transitions. Pt states she is very tired today and isnt getting much sleep. Took 2-3 attempts to stand from EOB and had to increase hieght of bed twice for her to be able to stand. she wanted to sit on commode in bathroom but once pt at commode she was unable to descend to commode with grab bar use. Transferred out of bathroom to bariatric BSC and needed increased time to sit and stand.     OT Diagnosis:    OT Problem List:   OT Treatment Interventions:     OT Goals(current goals can now be found in the care plan section)    Visit Information  Last OT Received On: 05/01/13 Assistance Needed: +1 History of Present Illness: from H&P 9/27: "  Natasha Ellis is a 79 y.o. 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 Patient reports that  for the last week she had been very restless do to acute lower back pain and that she has found some difficulty finding words. According to the daughter who was at bedside patient was also having intermittent episodes of AMS, confusion and inability to to her usual ADL's. Patient has been recently started on Valium to help with her lower back pain and muscle spasms. Patient denies any chest pain, shortness of breath, fever, chills, nausea/vomiting, abdominal pain, melena, hematochezia or any other acute complaints. Patient was last seen normal around 7:30 PM on 04/27/13. Given to that her symptoms were intermittent no medical attention was seek until 9/26 evening. In the ED patient CT scan did not reveal any acute intracranial abnormalities, but demonstrated wall lacunar infarction. Due to the concerns for TIA/stroke like symptoms triad hospitalist has been called to admit the patient for further evaluation and treatment.    Subjective Data      Prior Functioning       Cognition  Cognition Arousal/Alertness: Awake/alert Behavior During Therapy: WFL for tasks assessed/performed Overall Cognitive Status: Within Functional Limits for tasks assessed    Mobility  Bed Mobility Supine to Sit: 4: Min guard;HOB elevated;With rails;Other (comment) Sitting - Scoot to Edge of Bed: 4: Min guard Details for Bed Mobility Assistance: much increased time for supine to sit Transfers Transfers: Sit to Stand;Stand to Sit Sit to Stand: 3: Mod assist;With upper extremity assist;From chair/3-in-1 Stand to Sit: 3: Mod assist;With upper extremity assist;To chair/3-in-1 Details for Transfer Assistance: VCs for safe hand placememt    Exercises      Balance Balance Balance Assessed: Yes Dynamic Standing Balance Dynamic Standing - Level of Assistance: 4: Min assist;3: Mod assist   End of Session OT - End of Session Equipment Utilized During Treatment: Gait belt;Rolling walker Activity Tolerance: Patient limited  by fatigue;Patient limited by pain Patient left: in chair;with call bell/phone within reach;with family/visitor present;with chair alarm set  GO     [name] [phone] 05/01/2013, 11:34 AM

## 2013-05-01 NOTE — Progress Notes (Signed)
Subjective: Patient reports that her speech is improved.  The only deficits noted at this time related to speech are with recall.  Patient is on Plavix  Objective: Current vital signs: BP 130/68  Pulse 77  Temp(Src) 98 F (36.7 C) (Oral)  Resp 20  Ht 5\' 6"  (1.676 m)  Wt 122 kg (268 lb 15.4 oz)  BMI 43.43 kg/m2  SpO2 96% Vital signs in last 24 hours: Temp:  [97.7 F (36.5 C)-98.2 F (36.8 C)] 98 F (36.7 C) (09/29 1500) Pulse Rate:  [66-80] 77 (09/29 1500) Resp:  [18-20] 20 (09/29 1500) BP: (128-160)/(54-72) 130/68 mmHg (09/29 1500) SpO2:  [94 %-97 %] 96 % (09/29 1500)  Intake/Output from previous day: 09/28 0701 - 09/29 0700 In: 240 [P.O.:240] Out: 600 [Urine:600] Intake/Output this shift: Total I/O In: 560 [P.O.:560] Out: -  Nutritional status: Cardiac  Neurologic Exam: Mental Status:  Alert, oriented, thought content appropriate. Speech fluent without evidence of aphasia. Able to follow 3 step commands without difficulty.  Cranial Nerves:  II: Discs flat bilaterally; Visual fields grossly normal, pupils equal, round, reactive to light and accommodation  III,IV, VI: ptosis not present, extra-ocular motions intact bilaterally  V,VII: smile symmetric, facial light touch sensation normal bilaterally  VIII: hearing normal bilaterally  IX,X: gag reflex present  XI: bilateral shoulder shrug  XII: midline tongue extension  Motor:  Lifts all extremities against gravity  Sensory: Pinprick and light touch intact throughout, bilaterally  Deep Tendon Reflexes:  2+ throughout  Plantars:  Right: downgoing    Left: downgoing   Lab Results: Basic Metabolic Panel:  Recent Labs Lab 04/28/13 2017 04/30/13 0550 05/01/13 0445  NA 132* 133* 133*  K 3.9 3.6 3.4*  CL 93* 97 96  CO2 25 26 24   GLUCOSE 108* 90 99  BUN 27* 19 17  CREATININE 0.90 0.75 0.75  CALCIUM 10.2 9.2 9.5    Liver Function Tests:  Recent Labs Lab 04/28/13 2017  AST 38*  ALT 29  ALKPHOS 72   BILITOT 1.0  PROT 8.7*  ALBUMIN 3.5   No results found for this basename: LIPASE, AMYLASE,  in the last 168 hours No results found for this basename: AMMONIA,  in the last 168 hours  CBC:  Recent Labs Lab 04/28/13 2017  WBC 9.6  NEUTROABS 6.6  HGB 12.3  HCT 36.5  MCV 86.3  PLT 302    Cardiac Enzymes:  Recent Labs Lab 04/28/13 2017  TROPONINI <0.30    Lipid Panel:  Recent Labs Lab 04/29/13 0516  CHOL 126  TRIG 95  HDL 45  CHOLHDL 2.8  VLDL 19  LDLCALC 62    CBG:  Recent Labs Lab 04/30/13 1205 04/30/13 1714 04/30/13 2155 05/01/13 0723 05/01/13 1223  GLUCAP 84 94 101* 115* 93    Microbiology: Results for orders placed during the hospital encounter of 04/28/13  CLOSTRIDIUM DIFFICILE BY PCR     Status: None   Collection Time    05/01/13  1:25 AM      Result Value Range Status   C difficile by pcr NEGATIVE  NEGATIVE Final   Comment: Performed at [facility]    Coagulation Studies:  Recent Labs  04/28/13 2017  LABPROT 14.7  INR 1.17    Imaging: No results found.  Medications:  I have reviewed the patient's current medications. Scheduled: . amLODipine  5 mg Oral Daily  . budesonide-formoterol  2 puff Inhalation BID  . clopidogrel  75 mg Oral Q breakfast  .  fluticasone  1 spray Each Nare Daily  . furosemide  20 mg Oral Daily  . heparin  5,000 Units Subcutaneous Q8H  . pantoprazole  40 mg Oral Q1200  . potassium chloride  20 mEq Oral Daily  . sodium chloride  3 mL Intravenous Q12H    Assessment/Plan: Work up unremarkable  Echocardiogram shows no cardiac source of possible emboli.  On Plavix and tolerating without noted side effects.    Recommendations: 1.  No further neurologic intervention is recommended at this time.  If further questions arise, please call or page at that time.  Thank you for allowing neurology to participate in the care of this patient.  To follow up with neurology as an outpatient.    LOS: 3 days    Thana Farr, MD [facility] [phone] 05/01/2013  3:21 PM

## 2013-05-01 NOTE — Discharge Summary (Signed)
Physician Discharge Summary  Natasha Ellis:[id number] DOB: [birth year] DOA: 04/28/2013  PCP: Ginette Otto, MD  Admit date: 04/28/2013 Discharge date: 05/01/2013  Time spent: 65 minutes  Recommendations for Outpatient Follow-up:  1. Followup with Ginette Otto, MD in 1 week. A followup patient's blood pressure in the to be reassessed. Patient has been switched from aspirin to Plavix for secondary stroke prevention as patient had presented with probable TIA. Patient will need a basic metabolic profile done to followup on electrolytes and renal function. 2. Patient is to followup with Dr. Pearlean Brownie of neurology 2 months post discharge.  Discharge Diagnoses:  Principal Problem:   TIA (transient ischemic attack) Active Problems:   HTN (hypertension)   HLD (hyperlipidemia)   Lacunar infarction   OSA on CPAP   GERD (gastroesophageal reflux disease)   Back pain   Asthma   Discharge Condition: Stable and improved  Diet recommendation: Heart healthy  Filed Weights   04/28/13 2012 04/29/13 1430  Weight: 127.914 kg (282 lb) 122 kg (268 lb 15.4 oz)    History of present illness:  Natasha Ellis is a 79 y.o. 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 Patient reports that for the last week she had been very restless do to acute lower back pain and that she has found some difficulty finding words. According to the daughter who was at bedside patient was also having intermittent episodes of AMS, confusion and inability to to her usual ADL's. Patient has been recently started on Valium to help with her lower back pain and muscle spasms. Patient denies any chest pain, shortness of breath, fever, chills, nausea/vomiting, abdominal pain, melena, hematochezia or any other acute  complaints. Patient was last seen normal around 7:30 PM on 04/27/13. Given to that her symptoms were intermittent no medical attention was seek until 9/26 evening. In the ED patient CT scan did not reveal any acute intracranial abnormalities, but demonstrated wall lacunar infarction. Due to the concerns for TIA/stroke like symptoms [facility] has been called to admit the patient for further evaluation and treatment.   Hospital Course:  #1 altered mental status/probable TIA  Patient had presented with an expressive aphasia. Patient was admitted to the telemetry floor for stroke workup. Neurology consultation was obtained and patient was seen in consultation by Dr. Cyril Mourning. Patient improved clinically throughout the hospitalization was close to baseline by day of discharge. CT head negative. MRI of the head negative. Carotid Dopplers with no significant ICA stenosis preliminary readings. 2-D echo with EF a 55-60%. No wall motion abnormalities. No source of  Emboli. Patient was changed from aspirin to Plavix.  fasting lipid panel which was obtained came back with LDL of 62. Patient improved clinically. Patient be discharged home on Plavix for secondary stroke prevention. Patient is to followup with neurology as outpatient. #2 hypertension   During the hospitalization patient was maintained on her home regimen of  Lasix. Norvasc 5 mg daily, was added to patient's blood pressure regimen. Patient will followup with PCP as outpatient.  #3 hyperlipidemia  LDL is 62. Follow.  #4 old CVA  Seen on CT an MRI of the head. Patient has been switched to Plavix. Continue risk factor modification.  #5 gastroesophageal reflux disease  patient was maintained on a PPI.  #6 obstructive sleep apnea on CPAP  CPAP each bedtime.  #7 back  pain  Continued on ibuprofen and Tylenol as needed. PT/OT.  #8 asthma  Stable. Continued on Symbicort.      Procedures: 2-D echo 04/29/2013  Carotid Dopplers 04/29/2013  MRI  head 04/29/2013  CT head 04/28/2013  MRI of the L-spine 04/28/2013   Consultations: Neurology: Dr. Cyril Mourning 04/29/2013   Discharge Exam: Filed Vitals:   05/01/13 0959  BP: 144/70  Pulse: 72  Temp: 98 F (36.7 C)  Resp: 18    General: NAD Cardiovascular: RRR Respiratory: CTAB  Discharge Instructions      Discharge Orders   Future Orders Complete By Expires   Diet - low sodium heart healthy  As directed    Discharge instructions  As directed    Comments:     Follow up with Dr Pearlean Brownie, neurology in 2 months Follow up with Ginette Otto, MD in 1 week.   Increase activity slowly  As directed        Medication List    STOP taking these medications       aspirin 81 MG tablet      TAKE these medications       acetaminophen 500 MG tablet  Commonly known as:  TYLENOL  Take 500 mg by mouth every 6 (six) hours as needed for pain.     amLODipine 5 MG tablet  Commonly known as:  NORVASC  Take 1 tablet (5 mg total) by mouth daily.     budesonide-formoterol 80-4.5 MCG/ACT inhaler  Commonly known as:  SYMBICORT  Inhale 2 puffs into the lungs 2 (two) times daily.     cholecalciferol 1000 UNITS tablet  Commonly known as:  VITAMIN D  Take 1,000 Units by mouth daily.     cholestyramine 4 GM/DOSE powder  Commonly known as:  QUESTRAN  Take 4 g by mouth daily as needed.     clopidogrel 75 MG tablet  Commonly known as:  PLAVIX  Take 1 tablet (75 mg total) by mouth daily with breakfast.     cyanocobalamin 1000 MCG tablet  Take 100 mcg by mouth daily.     ferrous sulfate 325 (65 FE) MG tablet  Take 325 mg by mouth daily with breakfast.     fluticasone 50 MCG/ACT nasal spray  Commonly known as:  FLONASE  Place 1 spray into the nose 2 (two) times daily as needed. Each nostril     furosemide 20 MG tablet  Commonly known as:  LASIX  Take 1 tablet (20 mg total) by mouth daily.     ibuprofen 200 MG tablet  Commonly known as:  ADVIL,MOTRIN  Take 200 mg by  mouth every 6 (six) hours as needed for pain.     loperamide 2 MG capsule  Commonly known as:  IMODIUM  Take 1 capsule (2 mg total) by mouth 4 (four) times daily as needed for diarrhea or loose stools.     multivitamin capsule  Take 1 capsule by mouth daily. With Vitamin D 4000     pantoprazole 40 MG tablet  Commonly known as:  PROTONIX  Take 1 tablet (40 mg total) by mouth daily at 12 noon.     potassium chloride 10 MEQ tablet  Commonly known as:  K-DUR  Take 10 mEq by mouth daily.       Allergies  Allergen Reactions  . Codeine Other (See Comments)    Crazy-nightmares  . Erythromycin Other (See Comments)    GI upset  . Lisinopril Cough  . Oxybutynin   .  Sulfa Antibiotics Other (See Comments)    GI upset   Follow-up Information   Follow up with Ginette Otto, MD. Schedule an appointment as soon as possible for a visit in 1 week.   Specialty:  Internal Medicine   Contact information:   [address] [place] [address] [phone]       Follow up with Gates Rigg, MD. Schedule an appointment as soon as possible for a visit in 2 months.   Specialties:  Neurology, Radiology   Contact information:   [address] [place] [address] [phone]        The results of significant diagnostics from this hospitalization (including imaging, microbiology, ancillary and laboratory) are listed below for reference.    Significant Diagnostic Studies: Ct Abdomen Pelvis Wo Contrast  04/20/2013   CLINICAL DATA:  Sudden onset of low back pain. Increased urinary frequency. History of lymphoma.  EXAM: CT ABDOMEN AND PELVIS WITHOUT CONTRAST  TECHNIQUE: Multidetector CT imaging of the abdomen and pelvis was performed following the standard protocol without intravenous contrast.  COMPARISON:  No priors.  FINDINGS: Lung Bases: Extensive bronchial wall thickening throughout the lower lobes of the lungs bilaterally and the inferior segment of  the lingula, where there is some thickening of the peribronchovascular interstitium. Atherosclerotic calcifications within the left main, left anterior descending, left circumflex and right coronary arteries.  Abdomen/Pelvis: Calcified gallstones lying dependently within the gallbladder. No findings to suggest acute cholecystitis at this time. The unenhanced appearance of the liver, pancreas, spleen, right adrenal gland and bilateral kidneys is unremarkable. Within the left adrenal gland there is a 2.0 cm fatty attenuation lesion compatible with an adrenal myelolipoma, similar compared to priors. No abnormal calcifications within the collecting system of either kidney, along the course of either ureter, or within the lumen of the urinary bladder to suggest urinary tract calculi at this time. No hydroureteronephrosis or perinephric stranding to suggest urinary tract obstruction at this time.  Numerous colonic diverticulae noted, most pronounced in the region of the proximal sigmoid colon, without surrounding inflammatory changes to suggest an acute diverticulitis at this time. No significant volume of ascites. No pneumoperitoneum. No pathologic distention of small bowel. No definite pathologic lymphadenopathy identified within the abdomen or pelvis. Status post hysterectomy. Ovaries are not confidently identified may be surgically absent or atrophic.  Musculoskeletal: There are no aggressive appearing lytic or blastic lesions noted in the visualized portions of the skeleton.  IMPRESSION: 1. No acute findings in the abdomen or pelvis to account for the patient's symptoms. 2. Colonic diverticulosis without evidence to suggest acute diverticulitis at this time. 3. Cholelithiasis without evidence to suggest acute cholecystitis at this time. 4. Extensive atherosclerosis, including left main and 3 vessel coronary artery disease. 5. Probable scarring throughout the lung bases bilaterally related to prior infections. 6. 2.1  cm fatty attenuation left adrenal nodule compatible with a myelolipoma. 7. Additional incidental findings, as above.   Electronically Signed   By: Trudie Reed M.D.   On: 04/20/2013 23:06   Dg Chest 2 View  04/06/2013   *RADIOLOGY REPORT*  Clinical Data: Hypertension, shortness of breath, weakness, history of lymphoplasmacytic lymphoma, hypertension  CHEST - 2 VIEW  Comparison: 02/02/2013  Findings: Upper-normal size of cardiac silhouette. Atherosclerotic calcification aorta. Pulmonary vascularity normal. Peribronchial thickening and chronic accentuation of right infrahilar markings stable. Subsegmental atelectasis versus scarring at the right base unchanged. No acute segmental infiltrate, pleural effusion or pneumothorax. Bones demineralized.  IMPRESSION: Moderate  chronic bronchitic changes with chronic subsegmental atelectasis versus scarring at right base. No acute abnormalities.   Original Report Authenticated By: Ulyses Southward, M.D.   Ct Head Wo Contrast  04/28/2013   CLINICAL DATA:  Altered mental status. Slurred speech.  EXAM: CT HEAD WITHOUT CONTRAST  TECHNIQUE: Contiguous axial images were obtained from the base of the skull through the vertex without intravenous contrast.  COMPARISON:  None.  FINDINGS: The ventricles are normal in configuration. There is ventricular and sulcal enlargement reflecting moderate atrophy. No hydrocephalus.  No parenchymal masses or mass effect. There is a focal area of hypoattenuation along the anterior superior right base a ganglia consistent with an old lacune infarct. Mild periventricular white matter hypoattenuation is noted consistent with chronic microvascular ischemic change. There is no evidence of a recent transcortical infarct.  There are no extra-axial masses or abnormal fluid collections.  There is no intracranial hemorrhage.  Mild maxillary and moderate ethmoid and inferior right frontal sinus mucosal thickening is noted with mild sphenoid sinus mucosal  thickening associated with some dependent secretions. The mastoid air cells are clear.  IMPRESSION: 1. No acute intracranial abnormalities. 2. 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 3. Sinus disease with mucosal thickening involving all sinus cavities.   Electronically Signed   By: Amie Portland   On: 04/28/2013 21:10   Mr Maxine Glenn Head Wo Contrast  04/29/2013   CLINICAL DATA:  79 year old female with pain. Transient ischemic attack. Altered mental status.  EXAM: MRI HEAD WITHOUT CONTRAST  MRA HEAD WITHOUT CONTRAST  TECHNIQUE: Multiplanar, multiecho pulse sequences of the brain and surrounding structures were obtained without intravenous contrast. Angiographic images of the head were obtained using MRA technique without contrast.  COMPARISON:  Head CT without contrast 04/28/2013.  FINDINGS: MRI HEAD FINDINGS  No restricted diffusion to suggest acute infarction. No midline shift, mass effect, evidence of mass lesion, ventriculomegaly, extra-axial collection or acute intracranial hemorrhage. Cervicomedullary junction and pituitary are within normal limits. Negative visualized cervical spine. Major intracranial vascular flow voids are preserved.  Small chronic lacunar infarct right caudate nucleus. Superimposed small chronic cortically based infarcts in the right superior frontal gyrus (series 5, image 23). Elsewhere only mild nonspecific periventricular white matter T2 and FLAIR hyperintensity. Negative brainstem and cerebellum.  Postoperative changes to the globes. Paranasal sinus inflammatory changes, favor a component of chronic sinusitis. Mastoids are clear and visualized internal auditory structures appear normal. Negative scalp soft tissues. Normal bone marrow signal.  MRA HEAD FINDINGS  Antegrade flow in the posterior circulation with codominant distal vertebral arteries. Normal PICA origins. Patent vertebrobasilar junction with mild fenestration. Tortuous basilar  artery with suspected atherosclerosis but no stenosis. Motion artifact at the basilar tip. SCA and right PCA origins are normal. Fetal type left PCA origin. Bilateral PCA branches within normal limits. Right posterior communicating artery diminutive or absent.  Tortuous distal cervical ICAs. Antegrade flow in both ICA siphons. No ICA stenosis. Normal left posterior communicating artery origin. Patent carotid termini.  Mild motion artifact at the carotid termini. MCA and ACA origins within normal limits. Mildly dominant right ACA A1 segment. Anterior communicating artery within normal limits. Degraded detail of bilateral MCA and ACA branches due to motion. Visualized bilateral MCA and ACA branches are grossly normal.  IMPRESSION: MRI HEAD IMPRESSION  1. No acute intracranial abnormality.  2.  Chronic right MCA territory ischemia.  3. Paranasal sinus inflammatory changes, likely chronic to a degree.  MRA HEAD IMPRESSION  Mild for age  intracranial atherosclerosis. Motion artifact affecting detail of the MCA and ACA branches. No intracranial stenosis or major branch occlusion identified.   Electronically Signed   By: Augusto Gamble M.D.   On: 04/29/2013 11:48   Mri Brain Without Contrast  04/29/2013   CLINICAL DATA:  79 year old female with pain. Transient ischemic attack. Altered mental status.  EXAM: MRI HEAD WITHOUT CONTRAST  MRA HEAD WITHOUT CONTRAST  TECHNIQUE: Multiplanar, multiecho pulse sequences of the brain and surrounding structures were obtained without intravenous contrast. Angiographic images of the head were obtained using MRA technique without contrast.  COMPARISON:  Head CT without contrast 04/28/2013.  FINDINGS: MRI HEAD FINDINGS  No restricted diffusion to suggest acute infarction. No midline shift, mass effect, evidence of mass lesion, ventriculomegaly, extra-axial collection or acute intracranial hemorrhage. Cervicomedullary junction and pituitary are within normal limits. Negative visualized cervical  spine. Major intracranial vascular flow voids are preserved.  Small chronic lacunar infarct right caudate nucleus. Superimposed small chronic cortically based infarcts in the right superior frontal gyrus (series 5, image 23). Elsewhere only mild nonspecific periventricular white matter T2 and FLAIR hyperintensity. Negative brainstem and cerebellum.  Postoperative changes to the globes. Paranasal sinus inflammatory changes, favor a component of chronic sinusitis. Mastoids are clear and visualized internal auditory structures appear normal. Negative scalp soft tissues. Normal bone marrow signal.  MRA HEAD FINDINGS  Antegrade flow in the posterior circulation with codominant distal vertebral arteries. Normal PICA origins. Patent vertebrobasilar junction with mild fenestration. Tortuous basilar artery with suspected atherosclerosis but no stenosis. Motion artifact at the basilar tip. SCA and right PCA origins are normal. Fetal type left PCA origin. Bilateral PCA branches within normal limits. Right posterior communicating artery diminutive or absent.  Tortuous distal cervical ICAs. Antegrade flow in both ICA siphons. No ICA stenosis. Normal left posterior communicating artery origin. Patent carotid termini.  Mild motion artifact at the carotid termini. MCA and ACA origins within normal limits. Mildly dominant right ACA A1 segment. Anterior communicating artery within normal limits. Degraded detail of bilateral MCA and ACA branches due to motion. Visualized bilateral MCA and ACA branches are grossly normal.  IMPRESSION: MRI HEAD IMPRESSION  1. No acute intracranial abnormality.  2.  Chronic right MCA territory ischemia.  3. Paranasal sinus inflammatory changes, likely chronic to a degree.  MRA HEAD IMPRESSION  Mild for age intracranial atherosclerosis. Motion artifact affecting detail of the MCA and ACA branches. No intracranial stenosis or major branch occlusion identified.   Electronically Signed   By: Augusto Gamble M.D.    On: 04/29/2013 11:48   Mr Lumbar Spine Wo Contrast  04/28/2013   CLINICAL DATA:  79 year old female with severe low back pain radiating to both lower extremities.  EXAM: MRI LUMBAR SPINE WITHOUT CONTRAST  TECHNIQUE: Multiplanar, multisequence MR imaging was performed. No intravenous contrast was administered.  COMPARISON:  CT Abdomen and Pelvis 04/20/2013. Chest CT 03/30/2013.  FINDINGS: Eleven pairs of ribs demonstrated on 03/30/2013, and absent ribs at T12. By this numbering system, the S1 level is sacralized but there is a full size S1-S2 disc space. The level of mild grade 1 anterolisthesis with prominent vacuum disc phenomena on 04/20/2013 is L4-L5 (identical vacuum phenomena identified today on series 6, image 7). Correlation with radiographs is recommended prior to any operative intervention.  Stable vertebral height and alignment. Mild anterolisthesis of L4 on L5 measures about 4 mm. No marrow edema or evidence of acute osseous abnormality.  Visualized lower thoracic spinal cord is normal with conus medularis  at L1-L2.  Stable visualized abdominal viscera. Visualized paraspinal soft tissues are within normal limits.  T11-T12: Mild posterior element hypertrophy. No definite stenosis.  T12-L1: Anterior disc osteophyte complex. No stenosis.  L1-L2: Mild disc bulge. No significant stenosis.  L2-L3: Mild circumferential disc bulge. Mild epidural lipomatosis. No significant stenosis.  L3-L4: Mild circumferential disc bulge. Moderate facet and ligament flavum hypertrophy. In conjunction with epidural lipomatosis there is mild to moderate spinal stenosis and right greater than left L3 foraminal stenosis.  L4-L5: Grade 1 spondylolisthesis. Mild to moderate circumferential disc bulge. Moderate to severe facet and moderate ligament flavum hypertrophy. Moderate spinal stenosis (series 7, image 17). Mild to moderate right greater than left L4 foraminal stenosis.  L5-S1: Mild circumferential disc osteophyte complex  eccentric to the right. Chronic moderate to severe facet hypertrophy on the right, bilateral facet joint ankylosis suspected and better demonstrated on 04/20/2013. No significant stenosis. There is a small left foraminal annular tear of the disc (series 4, image 11).  S1-S2: Full size disc space. No stenosis. Incidental S2-S3 level small Tarlov cyst.  IMPRESSION: 1. Transitional anatomy, with numbering system on this study confirmed by Previous CT chest and CT abdomen and pelvis. Correlation with radiographs is recommended prior to any operative intervention.  2. Mild grade 1 spondylolisthesis at L4-L5 with disc and facet degeneration resulting in moderate spinal stenosis and mild to moderate right greater than left foraminal stenosis.  3. Multifactorial mild to moderate L3-L4 spinal and right greater than left foraminal stenosis.   Electronically Signed   By: Augusto Gamble M.D.   On: 04/28/2013 14:32   Nm Pulmonary Per & Vent  04/06/2013   *RADIOLOGY REPORT*  Clinical Data: Chronic cough, evaluate for pulmonary embolism  NUCLEAR MEDICINE VENTILATION - PERFUSION LUNG SCAN  Technique: Ventilatory images were obtained using aerosolized technetium DTPA.  Perfusion images were obtained in multiple projections after intravenous injection of Tc-73m MAA.  Radiopharmaceuticals:  41.7 mCi aerosolized technetium DTPA and 5 mCi Tc-55m MAA.  Comparison:  Chest radiograph - 04/06/2013; chest CT - 03/30/2013; 12/30/2012  Findings:  Review of chest radiograph performed earlier same day demonstrates borderline enlarged cardiac silhouette.  There is minimal bibasilar heterogeneous opacities, right greater than left.  There is mild diffuse slightly nodular thickening of the interstitium.  The lungs appear mildly hyperexpanded.  No pleural effusion or pneumothorax. No evidence of edema.  Ventilatory images:  There is relative mottled ventilation of the bilateral lungs with clumping about the bilateral pulmonary hila, right greater than  left.  Ingested radiotracer is seen within the mouth and hypopharynx as well as the esophagus and stomach.  Perfusion images: There is relative homogeneous distribution of injected radiotracer without discrete mismatched segmental or subsegmental filling defect to suggest pulmonary embolism.  IMPRESSION: Pulmonary embolism absent (very low probability of pulmonary embolism).   Original Report Authenticated By: Tacey Ruiz, MD    Microbiology: Recent Results (from the past 240 hour(s))  CLOSTRIDIUM DIFFICILE BY PCR     Status: None   Collection Time    05/01/13  1:25 AM      Result Value Range Status   C difficile by pcr NEGATIVE  NEGATIVE Final   Comment: Performed at [facility]     Labs: Basic Metabolic Panel:  Recent Labs Lab 04/28/13 2017 04/30/13 0550 05/01/13 0445  NA 132* 133* 133*  K 3.9 3.6 3.4*  CL 93* 97 96  CO2 25 26 24   GLUCOSE 108* 90 99  BUN 27* 19  17  CREATININE 0.90 0.75 0.75  CALCIUM 10.2 9.2 9.5   Liver Function Tests:  Recent Labs Lab 04/28/13 2017  AST 38*  ALT 29  ALKPHOS 72  BILITOT 1.0  PROT 8.7*  ALBUMIN 3.5   No results found for this basename: LIPASE, AMYLASE,  in the last 168 hours No results found for this basename: AMMONIA,  in the last 168 hours CBC:  Recent Labs Lab 04/28/13 2017  WBC 9.6  NEUTROABS 6.6  HGB 12.3  HCT 36.5  MCV 86.3  PLT 302   Cardiac Enzymes:  Recent Labs Lab 04/28/13 2017  TROPONINI <0.30   BNP: BNP (last 3 results)  Recent Labs  03/30/13 1631  PROBNP 70.0   CBG:  Recent Labs Lab 04/30/13 1205 04/30/13 1714 04/30/13 2155 05/01/13 0723 05/01/13 1223  GLUCAP 84 94 101* 115* 93       Signed:  Moyses Pavey  [facility] 05/01/2013, 2:47 PM

## 2013-05-01 NOTE — Progress Notes (Signed)
Patient has been having multiple bowel movements tonight. C. Diff lab was ordered under protocol to rule it out. Patient was placed on brown contact until c. Diff can be ruled out. Will continue to monitor patient.

## 2013-05-01 NOTE — Progress Notes (Signed)
Patient discharged to  [facility] transported by daughter via personal car; discharge packet, prepared by CSW and given to patient's daughter for facility. Patient alert and oriented, denies any distress. Skin intact, no wound noted. Transported to the car by staff via wheelchair.

## 2013-05-01 NOTE — Progress Notes (Signed)
CSW spoke with patient's daughter. She is agreeable for patient to go to [facility] even though CSW explained AT LENGTH that patient is observation status and that medicare would not pay for snf. Daughter states that she is painfully aware of that and that they will deal with that when needed. Patient is cleared for discharge. Packet copied and placed in [place]. Patient's daughter will transport patient.  Brenyn Petrey C. Lizeth Bencosme MSW, LCSW [phone]

## 2013-05-02 NOTE — Telephone Encounter (Signed)
Lt daughter know that admission was dyspnea due to obesity and hypertension reusultin physical deconditioning resulting in diastolic dysfunction which means heart muscle does not relax well afte pumpin blood out and worse  under exertion . THis is why she is short ob breath. In the short run, BP cpntrol and physical therapy needed. In long run weight loss  Give fu in 1-2 months  Dr. Kalman Shan, M.D., [facility].C.P Pulmonary and Critical Care Medicine Staff Physician [facility] Pager: [phone], If no answer or between  15:00h - 7:00h: call [phone]  05/02/2013 2:42 PM

## 2013-05-02 NOTE — Telephone Encounter (Signed)
Pt's daughter Bonita Quin returned call Advised that MR reviewed past results and his recommendations to follow up to discuss in further detail Per Bonita Quin, pt was recently discharged from the hospital (9/29) and is currently staying at [facility] for rehab so it may be a while before pt can follow up Will forward to MR as Lorain Childes

## 2013-05-02 NOTE — Telephone Encounter (Signed)
lmomtcb x2 for pt 

## 2013-05-03 DIAGNOSIS — I69998 Other sequelae following unspecified cerebrovascular disease: Secondary | ICD-10-CM | POA: Diagnosis not present

## 2013-05-03 DIAGNOSIS — I10 Essential (primary) hypertension: Secondary | ICD-10-CM | POA: Diagnosis not present

## 2013-05-03 DIAGNOSIS — R609 Edema, unspecified: Secondary | ICD-10-CM | POA: Diagnosis not present

## 2013-05-03 DIAGNOSIS — M6281 Muscle weakness (generalized): Secondary | ICD-10-CM | POA: Diagnosis not present

## 2013-05-03 DIAGNOSIS — R279 Unspecified lack of coordination: Secondary | ICD-10-CM | POA: Diagnosis not present

## 2013-05-03 DIAGNOSIS — G459 Transient cerebral ischemic attack, unspecified: Secondary | ICD-10-CM | POA: Diagnosis not present

## 2013-05-03 NOTE — Telephone Encounter (Signed)
Reminder has been placed to have pt f/u in 2 months if pt is able. Carron Curie, CMA

## 2013-05-04 DIAGNOSIS — I69998 Other sequelae following unspecified cerebrovascular disease: Secondary | ICD-10-CM | POA: Diagnosis not present

## 2013-05-04 DIAGNOSIS — I10 Essential (primary) hypertension: Secondary | ICD-10-CM | POA: Diagnosis not present

## 2013-05-04 DIAGNOSIS — M6281 Muscle weakness (generalized): Secondary | ICD-10-CM | POA: Diagnosis not present

## 2013-05-04 DIAGNOSIS — R609 Edema, unspecified: Secondary | ICD-10-CM | POA: Diagnosis not present

## 2013-05-04 DIAGNOSIS — R279 Unspecified lack of coordination: Secondary | ICD-10-CM | POA: Diagnosis not present

## 2013-05-04 DIAGNOSIS — G459 Transient cerebral ischemic attack, unspecified: Secondary | ICD-10-CM | POA: Diagnosis not present

## 2013-05-05 DIAGNOSIS — R609 Edema, unspecified: Secondary | ICD-10-CM | POA: Diagnosis not present

## 2013-05-05 DIAGNOSIS — M6281 Muscle weakness (generalized): Secondary | ICD-10-CM | POA: Diagnosis not present

## 2013-05-05 DIAGNOSIS — I10 Essential (primary) hypertension: Secondary | ICD-10-CM | POA: Diagnosis not present

## 2013-05-05 DIAGNOSIS — R279 Unspecified lack of coordination: Secondary | ICD-10-CM | POA: Diagnosis not present

## 2013-05-05 DIAGNOSIS — G459 Transient cerebral ischemic attack, unspecified: Secondary | ICD-10-CM | POA: Diagnosis not present

## 2013-05-05 DIAGNOSIS — I69998 Other sequelae following unspecified cerebrovascular disease: Secondary | ICD-10-CM | POA: Diagnosis not present

## 2013-05-08 DIAGNOSIS — G459 Transient cerebral ischemic attack, unspecified: Secondary | ICD-10-CM | POA: Diagnosis not present

## 2013-05-08 DIAGNOSIS — R609 Edema, unspecified: Secondary | ICD-10-CM | POA: Diagnosis not present

## 2013-05-08 DIAGNOSIS — I69998 Other sequelae following unspecified cerebrovascular disease: Secondary | ICD-10-CM | POA: Diagnosis not present

## 2013-05-08 DIAGNOSIS — M6281 Muscle weakness (generalized): Secondary | ICD-10-CM | POA: Diagnosis not present

## 2013-05-08 DIAGNOSIS — R279 Unspecified lack of coordination: Secondary | ICD-10-CM | POA: Diagnosis not present

## 2013-05-08 DIAGNOSIS — I10 Essential (primary) hypertension: Secondary | ICD-10-CM | POA: Diagnosis not present

## 2013-05-09 DIAGNOSIS — G459 Transient cerebral ischemic attack, unspecified: Secondary | ICD-10-CM | POA: Diagnosis not present

## 2013-05-09 DIAGNOSIS — M6281 Muscle weakness (generalized): Secondary | ICD-10-CM | POA: Diagnosis not present

## 2013-05-09 DIAGNOSIS — R279 Unspecified lack of coordination: Secondary | ICD-10-CM | POA: Diagnosis not present

## 2013-05-09 DIAGNOSIS — I10 Essential (primary) hypertension: Secondary | ICD-10-CM | POA: Diagnosis not present

## 2013-05-09 DIAGNOSIS — R609 Edema, unspecified: Secondary | ICD-10-CM | POA: Diagnosis not present

## 2013-05-09 DIAGNOSIS — I69998 Other sequelae following unspecified cerebrovascular disease: Secondary | ICD-10-CM | POA: Diagnosis not present

## 2013-05-10 DIAGNOSIS — I10 Essential (primary) hypertension: Secondary | ICD-10-CM | POA: Diagnosis not present

## 2013-05-10 DIAGNOSIS — G459 Transient cerebral ischemic attack, unspecified: Secondary | ICD-10-CM | POA: Diagnosis not present

## 2013-05-10 DIAGNOSIS — M6281 Muscle weakness (generalized): Secondary | ICD-10-CM | POA: Diagnosis not present

## 2013-05-10 DIAGNOSIS — R609 Edema, unspecified: Secondary | ICD-10-CM | POA: Diagnosis not present

## 2013-05-10 DIAGNOSIS — R279 Unspecified lack of coordination: Secondary | ICD-10-CM | POA: Diagnosis not present

## 2013-05-10 DIAGNOSIS — I69998 Other sequelae following unspecified cerebrovascular disease: Secondary | ICD-10-CM | POA: Diagnosis not present

## 2013-05-11 DIAGNOSIS — M6281 Muscle weakness (generalized): Secondary | ICD-10-CM | POA: Diagnosis not present

## 2013-05-11 DIAGNOSIS — I69998 Other sequelae following unspecified cerebrovascular disease: Secondary | ICD-10-CM | POA: Diagnosis not present

## 2013-05-11 DIAGNOSIS — G459 Transient cerebral ischemic attack, unspecified: Secondary | ICD-10-CM | POA: Diagnosis not present

## 2013-05-11 DIAGNOSIS — R609 Edema, unspecified: Secondary | ICD-10-CM | POA: Diagnosis not present

## 2013-05-11 DIAGNOSIS — I10 Essential (primary) hypertension: Secondary | ICD-10-CM | POA: Diagnosis not present

## 2013-05-11 DIAGNOSIS — R279 Unspecified lack of coordination: Secondary | ICD-10-CM | POA: Diagnosis not present

## 2013-05-12 DIAGNOSIS — G459 Transient cerebral ischemic attack, unspecified: Secondary | ICD-10-CM | POA: Diagnosis not present

## 2013-05-12 DIAGNOSIS — M6281 Muscle weakness (generalized): Secondary | ICD-10-CM | POA: Diagnosis not present

## 2013-05-12 DIAGNOSIS — R279 Unspecified lack of coordination: Secondary | ICD-10-CM | POA: Diagnosis not present

## 2013-05-12 DIAGNOSIS — I69998 Other sequelae following unspecified cerebrovascular disease: Secondary | ICD-10-CM | POA: Diagnosis not present

## 2013-05-12 DIAGNOSIS — R609 Edema, unspecified: Secondary | ICD-10-CM | POA: Diagnosis not present

## 2013-05-12 DIAGNOSIS — I10 Essential (primary) hypertension: Secondary | ICD-10-CM | POA: Diagnosis not present

## 2013-05-15 DIAGNOSIS — R279 Unspecified lack of coordination: Secondary | ICD-10-CM | POA: Diagnosis not present

## 2013-05-15 DIAGNOSIS — M6281 Muscle weakness (generalized): Secondary | ICD-10-CM | POA: Diagnosis not present

## 2013-05-15 DIAGNOSIS — I10 Essential (primary) hypertension: Secondary | ICD-10-CM | POA: Diagnosis not present

## 2013-05-15 DIAGNOSIS — R609 Edema, unspecified: Secondary | ICD-10-CM | POA: Diagnosis not present

## 2013-05-15 DIAGNOSIS — G459 Transient cerebral ischemic attack, unspecified: Secondary | ICD-10-CM | POA: Diagnosis not present

## 2013-05-15 DIAGNOSIS — I69998 Other sequelae following unspecified cerebrovascular disease: Secondary | ICD-10-CM | POA: Diagnosis not present

## 2013-05-15 DIAGNOSIS — K5900 Constipation, unspecified: Secondary | ICD-10-CM | POA: Diagnosis not present

## 2013-05-16 DIAGNOSIS — I69998 Other sequelae following unspecified cerebrovascular disease: Secondary | ICD-10-CM | POA: Diagnosis not present

## 2013-05-16 DIAGNOSIS — R609 Edema, unspecified: Secondary | ICD-10-CM | POA: Diagnosis not present

## 2013-05-16 DIAGNOSIS — R279 Unspecified lack of coordination: Secondary | ICD-10-CM | POA: Diagnosis not present

## 2013-05-16 DIAGNOSIS — M6281 Muscle weakness (generalized): Secondary | ICD-10-CM | POA: Diagnosis not present

## 2013-05-16 DIAGNOSIS — I10 Essential (primary) hypertension: Secondary | ICD-10-CM | POA: Diagnosis not present

## 2013-05-16 DIAGNOSIS — G459 Transient cerebral ischemic attack, unspecified: Secondary | ICD-10-CM | POA: Diagnosis not present

## 2013-05-17 DIAGNOSIS — I69998 Other sequelae following unspecified cerebrovascular disease: Secondary | ICD-10-CM | POA: Diagnosis not present

## 2013-05-17 DIAGNOSIS — M6281 Muscle weakness (generalized): Secondary | ICD-10-CM | POA: Diagnosis not present

## 2013-05-17 DIAGNOSIS — I10 Essential (primary) hypertension: Secondary | ICD-10-CM | POA: Diagnosis not present

## 2013-05-17 DIAGNOSIS — R279 Unspecified lack of coordination: Secondary | ICD-10-CM | POA: Diagnosis not present

## 2013-05-17 DIAGNOSIS — G459 Transient cerebral ischemic attack, unspecified: Secondary | ICD-10-CM | POA: Diagnosis not present

## 2013-05-17 DIAGNOSIS — R609 Edema, unspecified: Secondary | ICD-10-CM | POA: Diagnosis not present

## 2013-05-18 DIAGNOSIS — R279 Unspecified lack of coordination: Secondary | ICD-10-CM | POA: Diagnosis not present

## 2013-05-18 DIAGNOSIS — I10 Essential (primary) hypertension: Secondary | ICD-10-CM | POA: Diagnosis not present

## 2013-05-18 DIAGNOSIS — G459 Transient cerebral ischemic attack, unspecified: Secondary | ICD-10-CM | POA: Diagnosis not present

## 2013-05-18 DIAGNOSIS — I69998 Other sequelae following unspecified cerebrovascular disease: Secondary | ICD-10-CM | POA: Diagnosis not present

## 2013-05-18 DIAGNOSIS — R609 Edema, unspecified: Secondary | ICD-10-CM | POA: Diagnosis not present

## 2013-05-18 DIAGNOSIS — M6281 Muscle weakness (generalized): Secondary | ICD-10-CM | POA: Diagnosis not present

## 2013-05-19 DIAGNOSIS — I69998 Other sequelae following unspecified cerebrovascular disease: Secondary | ICD-10-CM | POA: Diagnosis not present

## 2013-05-19 DIAGNOSIS — G459 Transient cerebral ischemic attack, unspecified: Secondary | ICD-10-CM | POA: Diagnosis not present

## 2013-05-19 DIAGNOSIS — R609 Edema, unspecified: Secondary | ICD-10-CM | POA: Diagnosis not present

## 2013-05-19 DIAGNOSIS — R279 Unspecified lack of coordination: Secondary | ICD-10-CM | POA: Diagnosis not present

## 2013-05-19 DIAGNOSIS — M6281 Muscle weakness (generalized): Secondary | ICD-10-CM | POA: Diagnosis not present

## 2013-05-19 DIAGNOSIS — I10 Essential (primary) hypertension: Secondary | ICD-10-CM | POA: Diagnosis not present

## 2013-05-22 DIAGNOSIS — R059 Cough, unspecified: Secondary | ICD-10-CM | POA: Diagnosis not present

## 2013-05-22 DIAGNOSIS — M6281 Muscle weakness (generalized): Secondary | ICD-10-CM | POA: Diagnosis not present

## 2013-05-22 DIAGNOSIS — R279 Unspecified lack of coordination: Secondary | ICD-10-CM | POA: Diagnosis not present

## 2013-05-22 DIAGNOSIS — R609 Edema, unspecified: Secondary | ICD-10-CM | POA: Diagnosis not present

## 2013-05-22 DIAGNOSIS — R05 Cough: Secondary | ICD-10-CM | POA: Diagnosis not present

## 2013-05-22 DIAGNOSIS — I69998 Other sequelae following unspecified cerebrovascular disease: Secondary | ICD-10-CM | POA: Diagnosis not present

## 2013-05-22 DIAGNOSIS — I10 Essential (primary) hypertension: Secondary | ICD-10-CM | POA: Diagnosis not present

## 2013-05-22 DIAGNOSIS — G459 Transient cerebral ischemic attack, unspecified: Secondary | ICD-10-CM | POA: Diagnosis not present

## 2013-05-23 DIAGNOSIS — R609 Edema, unspecified: Secondary | ICD-10-CM | POA: Diagnosis not present

## 2013-05-23 DIAGNOSIS — I10 Essential (primary) hypertension: Secondary | ICD-10-CM | POA: Diagnosis not present

## 2013-05-23 DIAGNOSIS — R279 Unspecified lack of coordination: Secondary | ICD-10-CM | POA: Diagnosis not present

## 2013-05-23 DIAGNOSIS — M6281 Muscle weakness (generalized): Secondary | ICD-10-CM | POA: Diagnosis not present

## 2013-05-23 DIAGNOSIS — G459 Transient cerebral ischemic attack, unspecified: Secondary | ICD-10-CM | POA: Diagnosis not present

## 2013-05-23 DIAGNOSIS — I69998 Other sequelae following unspecified cerebrovascular disease: Secondary | ICD-10-CM | POA: Diagnosis not present

## 2013-05-24 DIAGNOSIS — M6281 Muscle weakness (generalized): Secondary | ICD-10-CM | POA: Diagnosis not present

## 2013-05-24 DIAGNOSIS — I10 Essential (primary) hypertension: Secondary | ICD-10-CM | POA: Diagnosis not present

## 2013-05-24 DIAGNOSIS — R609 Edema, unspecified: Secondary | ICD-10-CM | POA: Diagnosis not present

## 2013-05-24 DIAGNOSIS — R279 Unspecified lack of coordination: Secondary | ICD-10-CM | POA: Diagnosis not present

## 2013-05-24 DIAGNOSIS — I69998 Other sequelae following unspecified cerebrovascular disease: Secondary | ICD-10-CM | POA: Diagnosis not present

## 2013-05-24 DIAGNOSIS — G459 Transient cerebral ischemic attack, unspecified: Secondary | ICD-10-CM | POA: Diagnosis not present

## 2013-05-25 DIAGNOSIS — M6281 Muscle weakness (generalized): Secondary | ICD-10-CM | POA: Diagnosis not present

## 2013-05-25 DIAGNOSIS — R488 Other symbolic dysfunctions: Secondary | ICD-10-CM | POA: Diagnosis not present

## 2013-05-25 DIAGNOSIS — I69998 Other sequelae following unspecified cerebrovascular disease: Secondary | ICD-10-CM | POA: Diagnosis not present

## 2013-05-25 DIAGNOSIS — G459 Transient cerebral ischemic attack, unspecified: Secondary | ICD-10-CM | POA: Diagnosis not present

## 2013-05-25 DIAGNOSIS — R262 Difficulty in walking, not elsewhere classified: Secondary | ICD-10-CM | POA: Diagnosis not present

## 2013-05-25 DIAGNOSIS — R279 Unspecified lack of coordination: Secondary | ICD-10-CM | POA: Diagnosis not present

## 2013-06-05 DIAGNOSIS — G459 Transient cerebral ischemic attack, unspecified: Secondary | ICD-10-CM | POA: Diagnosis not present

## 2013-06-05 DIAGNOSIS — M6281 Muscle weakness (generalized): Secondary | ICD-10-CM | POA: Diagnosis not present

## 2013-06-05 DIAGNOSIS — I69998 Other sequelae following unspecified cerebrovascular disease: Secondary | ICD-10-CM | POA: Diagnosis not present

## 2013-06-05 DIAGNOSIS — R279 Unspecified lack of coordination: Secondary | ICD-10-CM | POA: Diagnosis not present

## 2013-06-05 DIAGNOSIS — R262 Difficulty in walking, not elsewhere classified: Secondary | ICD-10-CM | POA: Diagnosis not present

## 2013-06-05 DIAGNOSIS — R488 Other symbolic dysfunctions: Secondary | ICD-10-CM | POA: Diagnosis not present

## 2013-06-06 DIAGNOSIS — M6281 Muscle weakness (generalized): Secondary | ICD-10-CM | POA: Diagnosis not present

## 2013-06-06 DIAGNOSIS — G459 Transient cerebral ischemic attack, unspecified: Secondary | ICD-10-CM | POA: Diagnosis not present

## 2013-06-06 DIAGNOSIS — R262 Difficulty in walking, not elsewhere classified: Secondary | ICD-10-CM | POA: Diagnosis not present

## 2013-06-06 DIAGNOSIS — I69998 Other sequelae following unspecified cerebrovascular disease: Secondary | ICD-10-CM | POA: Diagnosis not present

## 2013-06-06 DIAGNOSIS — R279 Unspecified lack of coordination: Secondary | ICD-10-CM | POA: Diagnosis not present

## 2013-06-06 DIAGNOSIS — R488 Other symbolic dysfunctions: Secondary | ICD-10-CM | POA: Diagnosis not present

## 2013-06-07 DIAGNOSIS — M6281 Muscle weakness (generalized): Secondary | ICD-10-CM | POA: Diagnosis not present

## 2013-06-07 DIAGNOSIS — R262 Difficulty in walking, not elsewhere classified: Secondary | ICD-10-CM | POA: Diagnosis not present

## 2013-06-07 DIAGNOSIS — G459 Transient cerebral ischemic attack, unspecified: Secondary | ICD-10-CM | POA: Diagnosis not present

## 2013-06-07 DIAGNOSIS — R488 Other symbolic dysfunctions: Secondary | ICD-10-CM | POA: Diagnosis not present

## 2013-06-07 DIAGNOSIS — I69998 Other sequelae following unspecified cerebrovascular disease: Secondary | ICD-10-CM | POA: Diagnosis not present

## 2013-06-07 DIAGNOSIS — R279 Unspecified lack of coordination: Secondary | ICD-10-CM | POA: Diagnosis not present

## 2013-06-08 DIAGNOSIS — R059 Cough, unspecified: Secondary | ICD-10-CM | POA: Diagnosis not present

## 2013-06-08 DIAGNOSIS — R609 Edema, unspecified: Secondary | ICD-10-CM | POA: Diagnosis not present

## 2013-06-08 DIAGNOSIS — I2510 Atherosclerotic heart disease of native coronary artery without angina pectoris: Secondary | ICD-10-CM | POA: Diagnosis not present

## 2013-06-08 DIAGNOSIS — I10 Essential (primary) hypertension: Secondary | ICD-10-CM | POA: Diagnosis not present

## 2013-06-08 DIAGNOSIS — R05 Cough: Secondary | ICD-10-CM | POA: Diagnosis not present

## 2013-06-08 DIAGNOSIS — Z79899 Other long term (current) drug therapy: Secondary | ICD-10-CM | POA: Diagnosis not present

## 2013-06-08 DIAGNOSIS — E7800 Pure hypercholesterolemia, unspecified: Secondary | ICD-10-CM | POA: Diagnosis not present

## 2013-06-09 DIAGNOSIS — R488 Other symbolic dysfunctions: Secondary | ICD-10-CM | POA: Diagnosis not present

## 2013-06-09 DIAGNOSIS — G459 Transient cerebral ischemic attack, unspecified: Secondary | ICD-10-CM | POA: Diagnosis not present

## 2013-06-09 DIAGNOSIS — I69998 Other sequelae following unspecified cerebrovascular disease: Secondary | ICD-10-CM | POA: Diagnosis not present

## 2013-06-09 DIAGNOSIS — R262 Difficulty in walking, not elsewhere classified: Secondary | ICD-10-CM | POA: Diagnosis not present

## 2013-06-09 DIAGNOSIS — R279 Unspecified lack of coordination: Secondary | ICD-10-CM | POA: Diagnosis not present

## 2013-06-09 DIAGNOSIS — M6281 Muscle weakness (generalized): Secondary | ICD-10-CM | POA: Diagnosis not present

## 2013-06-13 DIAGNOSIS — G459 Transient cerebral ischemic attack, unspecified: Secondary | ICD-10-CM | POA: Diagnosis not present

## 2013-06-13 DIAGNOSIS — R488 Other symbolic dysfunctions: Secondary | ICD-10-CM | POA: Diagnosis not present

## 2013-06-13 DIAGNOSIS — I69998 Other sequelae following unspecified cerebrovascular disease: Secondary | ICD-10-CM | POA: Diagnosis not present

## 2013-06-13 DIAGNOSIS — R262 Difficulty in walking, not elsewhere classified: Secondary | ICD-10-CM | POA: Diagnosis not present

## 2013-06-13 DIAGNOSIS — M6281 Muscle weakness (generalized): Secondary | ICD-10-CM | POA: Diagnosis not present

## 2013-06-13 DIAGNOSIS — R279 Unspecified lack of coordination: Secondary | ICD-10-CM | POA: Diagnosis not present

## 2013-06-14 ENCOUNTER — Other Ambulatory Visit: Payer: Self-pay | Admitting: *Deleted

## 2013-06-16 DIAGNOSIS — R279 Unspecified lack of coordination: Secondary | ICD-10-CM | POA: Diagnosis not present

## 2013-06-16 DIAGNOSIS — G459 Transient cerebral ischemic attack, unspecified: Secondary | ICD-10-CM | POA: Diagnosis not present

## 2013-06-16 DIAGNOSIS — R488 Other symbolic dysfunctions: Secondary | ICD-10-CM | POA: Diagnosis not present

## 2013-06-16 DIAGNOSIS — M6281 Muscle weakness (generalized): Secondary | ICD-10-CM | POA: Diagnosis not present

## 2013-06-16 DIAGNOSIS — R262 Difficulty in walking, not elsewhere classified: Secondary | ICD-10-CM | POA: Diagnosis not present

## 2013-06-16 DIAGNOSIS — I69998 Other sequelae following unspecified cerebrovascular disease: Secondary | ICD-10-CM | POA: Diagnosis not present

## 2013-06-20 DIAGNOSIS — H6120 Impacted cerumen, unspecified ear: Secondary | ICD-10-CM | POA: Diagnosis not present

## 2013-06-21 ENCOUNTER — Telehealth: Payer: Self-pay | Admitting: Oncology

## 2013-06-21 NOTE — Telephone Encounter (Signed)
Gave pt appt for lab and MD 12/26th r/s from september due to hospitalizations

## 2013-06-27 DIAGNOSIS — I69998 Other sequelae following unspecified cerebrovascular disease: Secondary | ICD-10-CM | POA: Diagnosis not present

## 2013-06-27 DIAGNOSIS — R488 Other symbolic dysfunctions: Secondary | ICD-10-CM | POA: Diagnosis not present

## 2013-06-27 DIAGNOSIS — M6281 Muscle weakness (generalized): Secondary | ICD-10-CM | POA: Diagnosis not present

## 2013-06-27 DIAGNOSIS — G459 Transient cerebral ischemic attack, unspecified: Secondary | ICD-10-CM | POA: Diagnosis not present

## 2013-06-27 DIAGNOSIS — R262 Difficulty in walking, not elsewhere classified: Secondary | ICD-10-CM | POA: Diagnosis not present

## 2013-06-27 DIAGNOSIS — R279 Unspecified lack of coordination: Secondary | ICD-10-CM | POA: Diagnosis not present

## 2013-06-30 DIAGNOSIS — M6281 Muscle weakness (generalized): Secondary | ICD-10-CM | POA: Diagnosis not present

## 2013-06-30 DIAGNOSIS — R262 Difficulty in walking, not elsewhere classified: Secondary | ICD-10-CM | POA: Diagnosis not present

## 2013-06-30 DIAGNOSIS — R279 Unspecified lack of coordination: Secondary | ICD-10-CM | POA: Diagnosis not present

## 2013-06-30 DIAGNOSIS — R488 Other symbolic dysfunctions: Secondary | ICD-10-CM | POA: Diagnosis not present

## 2013-06-30 DIAGNOSIS — I69998 Other sequelae following unspecified cerebrovascular disease: Secondary | ICD-10-CM | POA: Diagnosis not present

## 2013-06-30 DIAGNOSIS — G459 Transient cerebral ischemic attack, unspecified: Secondary | ICD-10-CM | POA: Diagnosis not present

## 2013-07-05 DIAGNOSIS — M1710 Unilateral primary osteoarthritis, unspecified knee: Secondary | ICD-10-CM | POA: Diagnosis not present

## 2013-07-06 DIAGNOSIS — G4733 Obstructive sleep apnea (adult) (pediatric): Secondary | ICD-10-CM | POA: Diagnosis not present

## 2013-07-12 DIAGNOSIS — M1710 Unilateral primary osteoarthritis, unspecified knee: Secondary | ICD-10-CM | POA: Diagnosis not present

## 2013-07-19 DIAGNOSIS — M1710 Unilateral primary osteoarthritis, unspecified knee: Secondary | ICD-10-CM | POA: Diagnosis not present

## 2013-07-28 ENCOUNTER — Other Ambulatory Visit (HOSPITAL_BASED_OUTPATIENT_CLINIC_OR_DEPARTMENT_OTHER): Payer: Medicare Other

## 2013-07-28 ENCOUNTER — Ambulatory Visit (HOSPITAL_BASED_OUTPATIENT_CLINIC_OR_DEPARTMENT_OTHER): Payer: Medicare Other | Admitting: Oncology

## 2013-07-28 ENCOUNTER — Telehealth: Payer: Self-pay | Admitting: Oncology

## 2013-07-28 VITALS — BP 133/68 | HR 77 | Temp 97.0°F | Resp 18 | Ht 66.0 in | Wt 269.9 lb

## 2013-07-28 DIAGNOSIS — D649 Anemia, unspecified: Secondary | ICD-10-CM

## 2013-07-28 DIAGNOSIS — C8589 Other specified types of non-Hodgkin lymphoma, extranodal and solid organ sites: Secondary | ICD-10-CM

## 2013-07-28 LAB — CBC WITH DIFFERENTIAL/PLATELET
BASO%: 0.4 % (ref 0.0–2.0)
Basophils Absolute: 0 10*3/uL (ref 0.0–0.1)
EOS%: 1.3 % (ref 0.0–7.0)
Eosinophils Absolute: 0.1 10*3/uL (ref 0.0–0.5)
HCT: 30.6 % — ABNORMAL LOW (ref 34.8–46.6)
HGB: 10.3 g/dL — ABNORMAL LOW (ref 11.6–15.9)
LYMPH%: 43.1 % (ref 14.0–49.7)
MCH: 31.2 pg (ref 25.1–34.0)
MCHC: 33.8 g/dL (ref 31.5–36.0)
MCV: 92.1 fL (ref 79.5–101.0)
MONO#: 0.2 10*3/uL (ref 0.1–0.9)
MONO%: 5.1 % (ref 0.0–14.0)
NEUT#: 1.9 10*3/uL (ref 1.5–6.5)
NEUT%: 50.1 % (ref 38.4–76.8)
Platelets: 207 10*3/uL (ref 145–400)
RBC: 3.32 10*6/uL — ABNORMAL LOW (ref 3.70–5.45)
RDW: 17.2 % — ABNORMAL HIGH (ref 11.2–14.5)
WBC: 3.9 10*3/uL (ref 3.9–10.3)
lymph#: 1.7 10*3/uL (ref 0.9–3.3)

## 2013-07-28 LAB — COMPREHENSIVE METABOLIC PANEL (CC13)
ALT: 12 U/L (ref 0–55)
AST: 18 U/L (ref 5–34)
Albumin: 3.3 g/dL — ABNORMAL LOW (ref 3.5–5.0)
Alkaline Phosphatase: 71 U/L (ref 40–150)
Anion Gap: 10 mEq/L (ref 3–11)
BUN: 15.1 mg/dL (ref 7.0–26.0)
CO2: 24 mEq/L (ref 22–29)
Calcium: 9.4 mg/dL (ref 8.4–10.4)
Chloride: 108 mEq/L (ref 98–109)
Creatinine: 0.9 mg/dL (ref 0.6–1.1)
Glucose: 85 mg/dl (ref 70–140)
Potassium: 4.3 mEq/L (ref 3.5–5.1)
Sodium: 142 mEq/L (ref 136–145)
Total Bilirubin: 0.64 mg/dL (ref 0.20–1.20)
Total Protein: 8.1 g/dL (ref 6.4–8.3)

## 2013-07-28 NOTE — Telephone Encounter (Signed)
gv and printed appt sched and avs for pt for June 2015  °

## 2013-07-28 NOTE — Progress Notes (Signed)
   [facility]    OFFICE PROGRESS NOTE   INTERVAL HISTORY:   She returns for scheduled followup of non-Hodgkin's lymphoma. She feels well at present. She was admitted in September with an altered mental status and was diagnosed with a "TIA ". She said she had a flare of spinal stenosis prior to this hospital admission that required medical therapy. She believes medications caused the altered mental status.  Good appetite. She lost weight while staying in an assisted living area with her husband. No change in the left anterior neck lymph node.  Objective:  Vital signs in last 24 hours:  Blood pressure 133/68, pulse 77, temperature 97 F (36.1 C), temperature source Oral, resp. rate 18, height 5\' 6"  (1.676 m), weight 269 lb 14.4 oz (122.426 kg).    HEENT: Oropharynx without visible mass Lymphatics: Soft 1 cm left high anterior cervical node, less than 1 cm nodular prominence of the left submandibular gland. no other cervical, supraclavicular, axillary, or inguinal nodes Resp: Lungs clear bilaterally Cardio: Regular rate and rhythm GI: No hepatosplenomegaly Vascular: 1+ pitting edema at the low leg bilaterally   Lab Results:  Lab Results  Component Value Date   WBC 3.9 07/28/2013   HGB 10.3* 07/28/2013   HCT 30.6* 07/28/2013   MCV 92.1 07/28/2013   PLT 207 07/28/2013   NEUTROABS 1.9 07/28/2013   ANC 1.9  [name] on 10/24/2012-2940 Serum viscosity on 10/24/2012-2.1  Medications: I have reviewed the patient's current medications.  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 -Treated with multiple systemic therapies, most recently pentostatin/Cytoxan/rituximab in 2010  2. Left knee arthritis-now followed by Dr.Aluisio  3. Sleep apnea  4. Anemia-chronic, the  hemoglobin is slightly lower today-she reports the hemoglobin has been measured in this range in the past   Disposition:  No clinical evidence for progression of the non-Hodgkin's lymphoma. We will followup on the [name] level from today. She will contact us for new symptoms. Ms. Josephson will return for an office and lab visit in 6 months   Thornton Papas, MD  07/28/2013  11:35 AM

## 2013-07-31 LAB — VISCOSITY, SERUM: Viscosity, Serum: 2.2 rel to H2O — ABNORMAL HIGH (ref 1.5–1.9)

## 2013-07-31 LAB — IGM: IgM, Serum: 3670 mg/dL — ABNORMAL HIGH (ref 52–322)

## 2013-08-02 ENCOUNTER — Other Ambulatory Visit: Payer: Self-pay | Admitting: *Deleted

## 2013-08-02 ENCOUNTER — Encounter: Payer: Self-pay | Admitting: *Deleted

## 2013-08-02 DIAGNOSIS — C8589 Other specified types of non-Hodgkin lymphoma, extranodal and solid organ sites: Secondary | ICD-10-CM

## 2013-08-04 ENCOUNTER — Telehealth: Payer: Self-pay | Admitting: Oncology

## 2013-08-04 NOTE — Telephone Encounter (Signed)
, °

## 2013-08-10 DIAGNOSIS — M549 Dorsalgia, unspecified: Secondary | ICD-10-CM | POA: Diagnosis not present

## 2013-08-10 DIAGNOSIS — I10 Essential (primary) hypertension: Secondary | ICD-10-CM | POA: Diagnosis not present

## 2013-08-10 DIAGNOSIS — R609 Edema, unspecified: Secondary | ICD-10-CM | POA: Diagnosis not present

## 2013-08-10 DIAGNOSIS — R05 Cough: Secondary | ICD-10-CM | POA: Diagnosis not present

## 2013-08-10 DIAGNOSIS — R059 Cough, unspecified: Secondary | ICD-10-CM | POA: Diagnosis not present

## 2013-09-12 ENCOUNTER — Ambulatory Visit (INDEPENDENT_AMBULATORY_CARE_PROVIDER_SITE_OTHER): Payer: Medicare Other | Admitting: Internal Medicine

## 2013-09-12 ENCOUNTER — Encounter: Payer: Self-pay | Admitting: Internal Medicine

## 2013-09-12 VITALS — BP 122/82 | HR 78 | Ht 66.5 in | Wt 265.4 lb

## 2013-09-12 DIAGNOSIS — R053 Chronic cough: Secondary | ICD-10-CM

## 2013-09-12 DIAGNOSIS — R059 Cough, unspecified: Secondary | ICD-10-CM | POA: Diagnosis not present

## 2013-09-12 DIAGNOSIS — R05 Cough: Secondary | ICD-10-CM | POA: Diagnosis not present

## 2013-09-12 MED ORDER — GABAPENTIN 300 MG PO CAPS: ORAL_CAPSULE | ORAL | Status: DC

## 2013-09-12 NOTE — Patient Instructions (Addendum)
Cough is multi-factorial with cough neuropathy or irritable larynx or LPR cough playing a role If you want to improve your cough, need to follow below with 100% compliance  #For sinus  -  take generic fluticasone inhaler 2 squirts each nostril daily - netti pot daily at night  #FOr possible lung disease causing cough  - conitnue symbicort for now    #FOr LPR cough / irritable larync  - take 3 days off and observe complete voice rest  - refer to speech therapy Mr Natasha Ellis - Take gabapentin 300mg once daily x 5 days, then 300mg twice daily x 5 days, then 300mg three times daily to continue. If this makes you too sleepy or drowsy call us and we will cut your medication dosing down  #fOllowup  4 weeks with NP Natasha Ellis or myself   cough score at followup 

## 2013-09-12 NOTE — Assessment & Plan Note (Signed)
Cough is multi-factorial with cough neuropathy or irritable larynx or LPR cough playing a role If you want to improve your cough, need to follow below with 100% compliance  #For sinus  -  take generic fluticasone inhaler 2 squirts each nostril daily - netti pot daily at night  #FOr possible lung disease causing cough  - conitnue symbicort for now    #FOr LPR cough / irritable larync  - take 3 days off and observe complete voice rest  - refer to speech therapy Mr Garald Balding - Take gabapentin 300mg  once daily x 5 days, then 300mg  twice daily x 5 days, then 300mg  three times daily to continue. If this makes you too sleepy or drowsy call us and we will cut your medication dosing down  #fOllowup  4 weeks with NP Tammy or myself   cough score at followup

## 2013-09-12 NOTE — Progress Notes (Signed)
Subjective:    Patient ID: Natasha Ellis, female    DOB: [birth year], 80 y.o.   MRN: [id number]  HPI    reports that she quit smoking about 33 years ago. Her smoking use included Cigarettes. She has a 25 pack-year smoking history. She has never used smokeless tobacco.  IOV 12/14/2012  80 year old female.  CHronic cough x 11 years. Insidous onset. COugh worsenend in spring and fall. Quality - has sputum that is variable color yellow and white. Cough is mild but sometimes severe. When bad she is throwing up and gasping for breath. STable course. No associated tickle, gag, clearing of throat. THere is associatd post nasal drip but no acid reflux   Also, dyspnea. Insidious onet. Few to several months. Noticed it after she quit oxgen in sept 2013 when she moved from [place] to [place]. Was using only nocutrnal oxygen.     Cough relevnt hx  #Sinus/ENT/Allergy hx  - reports chronic post nasal drip - has seen ENT for lymphoma in neck  Many years ago but not for cough - denies hx of pollent allergies with sneezin but says she coughs inn pollen time - takes nasal steroids prn  #GI/GERD  - denies GERD - vehmently denies aspiration  #Misc - [place] gr[place] B cell diagnosed 2004. S/p chemo last 3 years ago at [place]. As of 2014: in [place]   #Pulmonary hx  - Pulmonary nodules: IN 2008 and 2009 and 2011 - small stable nodules per CT report at [facility]  - OSA - diagnosed sleep apnea . Using CPAP. But got rid of overnight oxygen in sept and an 2013 when she moved to [place] due to logistics   - hx of PFTs n [facility]; does not know details  - OF note, she is on symbicort for a diagnosis of "chronic bronchitis"  #Exposure  Smoing:  reports that she quit smoking about 33 years ago. Her smoking use included Cigarettes. She has a 25 pack-year smoking history. She has never used smokeless tobacco.   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 Moved from [place] to [place] in Sept 2013     REC Sign release to get  records from [facility]  For office notes, pft Have full PFT breathing test I will talk to Dr Benay Spice about getting just chest ct or chest ct with abdominal CT followup after tests    PFT 12/19/12  - Normal spiro, lung function but DLCO 56%. Will get CT chest   Telephoine call EndMay/Early June 2014:   reviewed CT scan of the chest 12/30/2012. Several abnormalities   1. she has evidence of some chronic inflammation in the lungs. I really need CD-ROM of CT scan from [place] and pulmonary notes from [place]. Please have the patient get them to me.   2. she also has calcium deposits in the blood vessels of the heart. So please refer her to cardiology. I have done an order  3. At time of followup I do need the [facility] records      Acute vsiit. 01/16/13  - Says dyspnea getting worse. Dyspnea present for years (last vist she told me months). Was stable. ASt last OV cough was dominant symptoms but last few weeks dyspnea worse and has become dominant symptoms. Now class 2-3 exertiona. RElieved with ret. No orthopnea and  No pnd. REpotedly had cardiac stress test with Dr Linard Millers and normal (last few weeks). No recent fever  but did have these in April 2014.  Of note, in feb 2014 she gave up PT at the [facility] home due to lack of energy. Dyspnea is associated with fatigue. In terms of fatigue: hgb 11gm% in March 2014 and started on Iron. She is on Vit D supplementation and she reports normal thyroid function  In terms of cough: still the same. She does have periodic yellow sputum just as before. Cough x 11 years. She is hesitant to have bronch because of pripor experience when she got MRSA infection in2009 after bronch per report. Denies aspiration  Walk test Walking desaturation test on6/16/14  185 feet x 3 laps:  Did NOT desaturate. Able to barely do Only 1 LAP and then stopped due to dyspnea.  Rest pulse ox was 97%, final pulse ox was 95%. HR response 91/min at rest to 106/min at  peak exertion.  EMail from Dr Adam Phenix at [facility] 01/25/13   -spirometry - mostly saw her for her OSA and recurrent bronchitis;   - last seen by myself in 2012;   - was hospitalized in April 2014 for 3 days due to pneumonia and managed by her PCP following knee surgery.  CXR was clear by June 2013.  - She had normal spirometry in 2012 - do not believe we did PFTs as dyspnea was never a prominent complaint.  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  I had maintained her on symbicort as it appeared to control the episodic bronchospasm.  However, she is probably at risk for opportunistic infections.    - Last CT chest was 2011 and did not reveal any parenchymal changes except for minimal scarring of LLL.    - She  is a remote tobacco user  I spoke to her on 01/25/13  after getting info from [place] at [facility]:  he says she  had normal scan last few years. Patient now continues to have dyspnea though handled cataract surgery well few weeks ago and is scheduled for another one tomorrow 01/2613  REC   #shortness of breath  - will test for oxygen levels at night on room air while using CPAP  #cough  - I will wait to hear from Dr Adam Phenix  #Followup - keep 02/02/13 visit  - wait to hear more from me   Telephone call 01/25/13 Tke levaquin 568m once daily  X 5 days. I have ordered auutoimmune lab work, please have her do that     OV 02/02/2013 FU cough and dyspnea  - since antibiotic Rx cough and dyspnea 25% better but still with residual symptoms. SHe is freustrated by her symptomatology. Objectively no change in RSI cough score. Labs 01/30/13:  SEd rate 17. Autoimmune panel - negative. ONO test is still pending.    CXR today  Dg Chest 2 View  02/02/2013   *RADIOLOGY REPORT*  Clinical Data: History of chronic coughing and pulmonary infiltrates.  CHEST - 2 VIEW  Comparison: 09/26/2012.  CT  12/30/2012.  Findings: Cardiac silhouette is upper range of normal size. Ectasia and nonaneurysmal calcification of the thoracic aorta are seen. Mediastinal and hilar contours appear stable.  There is chronic elevation of the right hemidiaphragm with slight increased markings in the right medial base.  No consolidation or pleural effusion is seen.  Tiny area of vague nodularity projects over the anterior aspect of the right third rib on the PA image appears unchanged. This could reflect vessel on end or a small granuloma.  There  is some central peribronchial thickening with slight increase in perihilar markings.  Flattening of the diaphragm on lateral images seen consistent L of hyperinflation.  Minimal degenerative spondylosis is present.  There is also syndesmophyte formation in the spine.  There is osteopenic appearance of bones. Haziness over costophrenic angle region on PA image most likely is secondary to superimposed soft tissues.  IMPRESSION: Flattening of diaphragm on lateral image with overall hyperinflation configuration.  Chronic elevation of right hemidiaphragm with slight chronic increased markings in right medial base.  Stable tiny area of nodularity over anterior aspect of right third rib.  Central peribronchial thickening.  Slight increased perihilar markings.  No consolidation or pleural effusion is seen.   Original Report Authenticated By: Shanon Brow Call    REC Please have CXR today; will call next week with results Depending on cxr results have to time next scan of chest Sign release to get records from Dr Court Joy Fax; [phone] [facility], try sample Pulmicort 1 puff twice daily for your cough Will await ONO results [place] depending on xray results    OV 03/30/2013  She still complaining of persistent dyspnea. is worse. Dyspnea is class III. Walking from walkin 10 feet  brings on dyspnea. She had CT scan of the chest today and this shows significant clearing of her  infiltrates compared to May 2014. She's really suppressed with this result because her dyspnea is worse. She's having chronic cough without any change. She's having mild low sputum that is without any change. Since last visit she has registered with Dr. Maxwell Caul sleep specialist and oxygen has been added nocturnal CPAP. She is due to start her oxygen tomorrow. She is full of her sense of doom and gloom because of worsening dyspnea. Denies any associated chest pain or fever. No change in chronic venous stasis edema. Denies any hemoptysis or pleuritic chest pain >>rx lasix 60m daily , VQ scan ordered and BNP was nml   04/06/2013 Follow up  Returns for 1 week follow up . Seen last ov with persistent DOE. Started on Lasix 266mdaily . Labs revealed nml BNP. D Dimer was slightly elevated.  Weight is down 2 lbs with less leg swelling . But no change in dyspnea level. Has DOE with minimal actiivty   She has a VQ scan and doppler set up today.  No n/v/, fever, increased cough, chest pain .  Remains on symbicort Twice daily  .     OV 09/12/2013  Chief Complaint  Patient presents with  . Follow-up    Productive chronic cough X 13 years with yellow to white muscous. No chest pain/tightness. Mild wheezing intermittently.     Followup visit. This visit is for chronic cough. I originally saw her in May 2014 for chronic cough then by sometime in 2014 her main problem became dyspnea which resulted in an extensive evaluation. At this point in time she tells me that her dyspnea is largely improved at weight loss and daily exercise. So she is more concerned about her chronic cough. Cough has been present for 13 years. It is present daily. Severity is moderate to severe. It is occasionally dry but mostly productive of chronic yellow mucus. There is associated postnasal drainage. Is no relief with daily Symbicort. No clear-cut aggravating or relieving factors. She does clear her throat. No change in chronic cough and  recent few weeks. No recent viral illness. RSI cough score is 14 and reflects irritable larynx syndrome.Not interested in zpak or prednisone for cough  Past medical history reviewed: She is seen oncology and in her lymphoma is felt to be under control. Though she admits postnasal drainage he denies acid reflux and she not on therapy for the same. She has not been compliant with Flonase for nasal drainage or Nettie pot    Dr Lorenza Cambridge Reflux Symptom Index (> 13-15 suggestive of LPR cough) 0 -> 5  =  none ->severe problem  Hoarseness of problem with voice 1  Clearing  Of Throat 0  Excess throat mucus or feeling of post nasal drip 4  Difficulty swallowing food, liquid or tablets 1  Cough after eating or lying down 3  Breathing difficulties or choking episodes 0  Troublesome or annoying cough 5  Sensation of something sticking in throat or lump in throat 0  Heartburn, chest pain, indigestion, or stomach acid coming up 0  TOTAL 14     Review of Systems  Constitutional: Negative for fever and unexpected weight change.  HENT: Positive for congestion and postnasal drip. Negative for dental problem, ear pain, nosebleeds, rhinorrhea, sinus pressure, sneezing, sore throat and trouble swallowing.   Eyes: Negative for redness and itching.  Respiratory: Positive for cough, shortness of breath and wheezing. Negative for chest tightness.   Cardiovascular: Negative for chest pain, palpitations and leg swelling.  Gastrointestinal: Negative for nausea and vomiting.  Genitourinary: Negative for dysuria.  Musculoskeletal: Negative for joint swelling.  Skin: Negative for rash.  Neurological: Negative for headaches.  Hematological: Does not bruise/bleed easily.  Psychiatric/Behavioral: Negative for dysphoric mood. The patient is not nervous/anxious.        Objective:   Physical Exam  Vitals reviewed. Constitutional: She is oriented to person, place, and time. She appears well-developed and  well-nourished. No distress.  obese  HENT:  Head: Normocephalic and atraumatic.  Right Ear: External ear normal.  Left Ear: External ear normal.  Mouth/Throat: Oropharynx is clear and moist. No oropharyngeal exudate.  Eyes: Conjunctivae and EOM are normal. Pupils are equal, round, and reactive to light. Right eye exhibits no discharge. Left eye exhibits no discharge. No scleral icterus.  Neck: Normal range of motion. Neck supple. No JVD present. No tracheal deviation present. No thyromegaly present.  Cardiovascular: Normal rate, regular rhythm, normal heart sounds and intact distal pulses.  Exam reveals no gallop and no friction rub.   No murmur heard. Pulmonary/Chest: Effort normal and breath sounds normal. No respiratory distress. She has no wheezes. She has no rales. She exhibits no tenderness.  Coarse lungs  Abdominal: Soft. Bowel sounds are normal. She exhibits no distension and no mass. There is no tenderness. There is no rebound and no guarding.  Musculoskeletal: Normal range of motion. She exhibits no edema and no tenderness.  Uses cane  Lymphadenopathy:    She has no cervical adenopathy.  Neurological: She is alert and oriented to person, place, and time. She has normal reflexes. No cranial nerve deficit. She exhibits normal muscle tone. Coordination normal.  Skin: Skin is warm and dry. No rash noted. She is not diaphoretic. No erythema. No pallor.  Psychiatric: She has a normal mood and affect. Her behavior is normal. Judgment and thought content normal.  Fl;at affect          Assessment & Plan:

## 2013-09-14 ENCOUNTER — Ambulatory Visit: Payer: Medicare Other | Attending: Internal Medicine

## 2013-09-15 ENCOUNTER — Telehealth: Payer: Self-pay | Admitting: Internal Medicine

## 2013-09-15 NOTE — Telephone Encounter (Signed)
Called and spoke with pt. She reports she met with Dawayne Cirri on Thursday. She reports she is not going to do what MR recommended at this time bc her husband passed away couple weeks ago and she is having several visitors a day. She will call when she is ready to get started on treatment and do what M,R recommends. She also cancelled pending appt at this time and will r/s when ready. She does not want a call back. I advised will forward to MR as an [place]

## 2013-09-20 DIAGNOSIS — L820 Inflamed seborrheic keratosis: Secondary | ICD-10-CM | POA: Diagnosis not present

## 2013-09-20 DIAGNOSIS — L570 Actinic keratosis: Secondary | ICD-10-CM | POA: Diagnosis not present

## 2013-09-20 DIAGNOSIS — D1801 Hemangioma of skin and subcutaneous tissue: Secondary | ICD-10-CM | POA: Diagnosis not present

## 2013-09-20 DIAGNOSIS — Z85828 Personal history of other malignant neoplasm of skin: Secondary | ICD-10-CM | POA: Diagnosis not present

## 2013-10-11 DIAGNOSIS — C44721 Squamous cell carcinoma of skin of unspecified lower limb, including hip: Secondary | ICD-10-CM | POA: Diagnosis not present

## 2013-10-16 ENCOUNTER — Ambulatory Visit: Payer: Medicare Other | Admitting: Internal Medicine

## 2013-10-25 DIAGNOSIS — C44721 Squamous cell carcinoma of skin of unspecified lower limb, including hip: Secondary | ICD-10-CM | POA: Diagnosis not present

## 2013-10-30 ENCOUNTER — Other Ambulatory Visit (HOSPITAL_BASED_OUTPATIENT_CLINIC_OR_DEPARTMENT_OTHER): Payer: Medicare Other

## 2013-10-30 DIAGNOSIS — C8589 Other specified types of non-Hodgkin lymphoma, extranodal and solid organ sites: Secondary | ICD-10-CM

## 2013-10-30 DIAGNOSIS — C880 Waldenstrom macroglobulinemia: Secondary | ICD-10-CM | POA: Diagnosis not present

## 2013-10-30 LAB — CBC WITH DIFFERENTIAL/PLATELET
BASO%: 0.6 % (ref 0.0–2.0)
Basophils Absolute: 0 10*3/uL (ref 0.0–0.1)
EOS%: 2.1 % (ref 0.0–7.0)
Eosinophils Absolute: 0.1 10*3/uL (ref 0.0–0.5)
HCT: 32.9 % — ABNORMAL LOW (ref 34.8–46.6)
HGB: 10.8 g/dL — ABNORMAL LOW (ref 11.6–15.9)
LYMPH%: 47 % (ref 14.0–49.7)
MCH: 28.4 pg (ref 25.1–34.0)
MCHC: 32.8 g/dL (ref 31.5–36.0)
MCV: 86.6 fL (ref 79.5–101.0)
MONO#: 0.2 10*3/uL (ref 0.1–0.9)
MONO%: 5.5 % (ref 0.0–14.0)
NEUT#: 2 10*3/uL (ref 1.5–6.5)
NEUT%: 44.8 % (ref 38.4–76.8)
Platelets: 253 10*3/uL (ref 145–400)
RBC: 3.8 10*6/uL (ref 3.70–5.45)
RDW: 16.8 % — ABNORMAL HIGH (ref 11.2–14.5)
WBC: 4.5 10*3/uL (ref 3.9–10.3)
lymph#: 2.1 10*3/uL (ref 0.9–3.3)

## 2013-11-01 ENCOUNTER — Telehealth: Payer: Self-pay | Admitting: *Deleted

## 2013-11-01 NOTE — Telephone Encounter (Signed)
Message copied by Brien Few on Wed Nov 01, 2013  3:28 PM ------      Message from: Ladell Pier      Created: Tue Oct 31, 2013  9:12 PM       Please call patient, [name] and hb are stable, f/u as scheduled ------

## 2013-11-01 NOTE — Telephone Encounter (Signed)
Called pt with lab results, [name] and HGB are stable. She voiced understanding. Next appt confirmed.

## 2013-11-03 LAB — IGM: IgM, Serum: 3750 mg/dL — ABNORMAL HIGH (ref 52–322)

## 2013-11-03 LAB — VISCOSITY, SERUM: Viscosity, Serum: 2.6 rel to H2O — ABNORMAL HIGH (ref 1.5–1.9)

## 2013-11-14 DIAGNOSIS — C44721 Squamous cell carcinoma of skin of unspecified lower limb, including hip: Secondary | ICD-10-CM | POA: Diagnosis not present

## 2013-12-01 ENCOUNTER — Telehealth: Payer: Self-pay | Admitting: *Deleted

## 2013-12-01 NOTE — Telephone Encounter (Addendum)
Called pt with lab results. She reports temp of 101.4 yesterday with a cough. Cough is productive yellow sputum. Pt reports cough is chronic. Reports feeling short of breath after a bout of coughing. Started erythromycin that she had on hand from her oncologist in [place]. Reviewed with Dr. Benay Spice: Go to ED for evaluation if you develop dyspnea, shaking chills or if fever is persistent. Pt voiced understanding.

## 2013-12-01 NOTE — Telephone Encounter (Signed)
Message copied by Brien Few on Fri Dec 01, 2013  3:52 PM ------      Message from: [name] B      Created: Thu Nov 30, 2013  9:16 PM       Please call patient, [name] and viscosity are slightly higher, can continue observation if she feels well, f/u as scheduled ------

## 2013-12-04 ENCOUNTER — Other Ambulatory Visit: Payer: Self-pay | Admitting: Geriatric Medicine

## 2013-12-04 ENCOUNTER — Ambulatory Visit
Admission: RE | Admit: 2013-12-04 | Discharge: 2013-12-04 | Disposition: A | Discharge status: Home / Self Care | Payer: Medicare Other | Source: Ambulatory Visit | Attending: Geriatric Medicine | Admitting: Geriatric Medicine

## 2013-12-04 DIAGNOSIS — J45909 Unspecified asthma, uncomplicated: Secondary | ICD-10-CM

## 2013-12-04 DIAGNOSIS — I10 Essential (primary) hypertension: Secondary | ICD-10-CM | POA: Diagnosis not present

## 2013-12-04 DIAGNOSIS — J479 Bronchiectasis, uncomplicated: Secondary | ICD-10-CM | POA: Diagnosis not present

## 2013-12-11 ENCOUNTER — Telehealth: Payer: Self-pay | Admitting: Oncology

## 2013-12-11 ENCOUNTER — Telehealth: Payer: Self-pay | Admitting: *Deleted

## 2013-12-11 NOTE — Telephone Encounter (Signed)
S/w the pt and she is aware of her appt tomorrow at 2:45pm

## 2013-12-11 NOTE — Telephone Encounter (Signed)
Message from pt reporting a "bad flare up of lymphoma, my neck is humongous." Requesting to be worked in for visit today. Reviewed with Dr. Benay Spice: Will work in for appt. Called pt with appt for 5/12. Instructed her to report to ED if swelling causes dyspnea or trouble swallowing. She voiced understanding, stated she is having mild dyspnea at rest. Instructed her to call EMS if this worsens, pt voiced understanding. Dr. Benay Spice aware.

## 2013-12-12 ENCOUNTER — Telehealth: Payer: Self-pay | Admitting: Oncology

## 2013-12-12 ENCOUNTER — Ambulatory Visit (HOSPITAL_BASED_OUTPATIENT_CLINIC_OR_DEPARTMENT_OTHER): Payer: Medicare Other | Admitting: Nurse Practitioner

## 2013-12-12 VITALS — BP 151/65 | HR 75 | Temp 98.6°F | Resp 22 | Ht 66.5 in | Wt 261.3 lb

## 2013-12-12 DIAGNOSIS — R609 Edema, unspecified: Secondary | ICD-10-CM

## 2013-12-12 DIAGNOSIS — C8589 Other specified types of non-Hodgkin lymphoma, extranodal and solid organ sites: Secondary | ICD-10-CM

## 2013-12-12 NOTE — Telephone Encounter (Signed)
Gave pt appt for ML and MD for june 2015

## 2013-12-12 NOTE — Progress Notes (Addendum)
  [facility] OFFICE PROGRESS NOTE   Diagnosis:  Low-grade B-cell non-Hodgkin's lymphoma.  INTERVAL HISTORY:   Natasha Ellis returns prior to scheduled followup. She reports onset of "tenderness" at the left jaw on 12/09/2013. The following day she noted swelling. She has been applying ice and taking ibuprofen. She has noted some improvement in the swelling over the past day. No fevers or sweats. No shaking chills. She denies dysphagia. She has dyspnea on exertion. No chest pain. About 2 weeks ago she was diagnosed with "asthmatic bronchitis" and completed a course of prednisone and a Z-Pak.  Objective:  Vital signs in last 24 hours:  Blood pressure 151/65, pulse 75, temperature 98.6 F (37 C), temperature source Oral, resp. rate 22, height 5' 6.5" (1.689 m), weight 261 lb 4.8 oz (118.525 kg), SpO2 98.00%.    HEENT: Soft fullness left buccal mucosa. No mass lesion within the oral cavity. Left preauricular edema with mild erythema, mild tenderness. Lymphatics: No palpable cervical, supraclavicular, axillary or inguinal lymph nodes. Resp: Faint wheezes upper lung fields. Expiratory rhonchi at the lung bases. No respiratory distress. Cardio: Regular cardiac rhythm. GI: Soft and nontender. No organomegaly. Vascular: No leg edema.     Lab Results:  Lab Results  Component Value Date   WBC 4.5 10/30/2013   HGB 10.8* 10/30/2013   HCT 32.9* 10/30/2013   MCV 86.6 10/30/2013   PLT 253 10/30/2013   NEUTROABS 2.0 10/30/2013   10/30/2013-[name] 3750, serum viscosity 2.6 Imaging:  No results found.  Medications: I have reviewed the patient's current medications.  Assessment/Plan: 1. 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 Treated with multiple systemic therapies most recently  pentostatin/Cytoxan/rituximab in 2010. 2. Left knee arthritis. Followed by Dr. Wynelle Link. 3. Sleep apnea. 4. Anemia. Chronic. 5. Question left parotitis.   Disposition: Natasha Ellis presents with edema and pain over the left parotid gland. Question viral parotitis. The edema has improved over the past 24 hours. She is afebrile and does not appear acutely ill.  If the edema worsens or does not resolve we will proceed with radiographic evaluation with a CT scan to evaluate for adenopathy.  She will return for a followup visit on 12/22/2013. She understands to contact the office with progression of current symptoms or onset of new symptoms.   Patient seen with Dr. Benay Spice. 25 minutes were spent face-to-face at today's visit with the majority of that time involved in counseling/coordination of care.    Owens Shark ANP/GNP-BC   12/12/2013  4:41 PM  This was a shared visit with Ned Card.  Natasha Ellis was interviewed and examined. She had a recent upper respiratory infection and now has swelling of the left side of the face and left parotid. No discrete mass or lymph node. The swelling may be related to the recent infection or less likely progression of lymphoma. She will contact us for increased swelling or dyspnea.  Julieanne Manson, M.D.

## 2013-12-22 ENCOUNTER — Ambulatory Visit (HOSPITAL_BASED_OUTPATIENT_CLINIC_OR_DEPARTMENT_OTHER): Payer: Medicare Other | Admitting: Nurse Practitioner

## 2013-12-22 VITALS — BP 128/69 | HR 81 | Temp 97.6°F | Resp 18 | Ht 66.0 in | Wt 257.9 lb

## 2013-12-22 DIAGNOSIS — C8590 Non-Hodgkin lymphoma, unspecified, unspecified site: Secondary | ICD-10-CM

## 2013-12-22 DIAGNOSIS — C8589 Other specified types of non-Hodgkin lymphoma, extranodal and solid organ sites: Secondary | ICD-10-CM | POA: Diagnosis not present

## 2013-12-22 NOTE — Progress Notes (Signed)
  [facility] OFFICE PROGRESS NOTE   Diagnosis:  Low-grade B-cell non-Hodgkin's lymphoma.  INTERVAL HISTORY:   Ms. Sant returns as scheduled. 4-5 days ago she noted obvious improvement in the left facial swelling. The pain she was experiencing has resolved. She denies fevers or sweats. She continues to have wheezing and dyspnea with significant exertion.  Objective:  Vital signs in last 24 hours:  Blood pressure 128/69, pulse 81, temperature 97.6 F (36.4 C), temperature source Oral, resp. rate 18, height 5\' 6"  (1.676 m), weight 257 lb 14.4 oz (116.983 kg).    HEENT: No thrush. Previous left preauricular edema/erythema has resolved. Nontender. Slight asymmetry left anterior neck. Lymphatics: No palpable cervical, supraclavicular lymph nodes. Resp: Expiratory wheezes posteriorly. Cardio: Regular cardiac rhythm. GI: Abdomen soft and nontender. Vascular: No leg edema.    Lab Results:  Lab Results  Component Value Date   WBC 4.5 10/30/2013   HGB 10.8* 10/30/2013   HCT 32.9* 10/30/2013   MCV 86.6 10/30/2013   PLT 253 10/30/2013   NEUTROABS 2.0 10/30/2013    Imaging:  No results found.  Medications: I have reviewed the patient's current medications.  Assessment/Plan: 1. 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 Treated with multiple systemic therapies most recently pentostatin/Cytoxan/rituximab in 2010. 2. Left knee arthritis. Followed by Dr. Wynelle Link. 3. Sleep apnea. 4. Anemia. Chronic. 5. Question left parotitis 12/12/2013 presenting with left facial and parotid edema.   Disposition: She appears stable. The left facial/parotid edema and associated tenderness has resolved.  She will return as scheduled on 01/22/2014. She will contact the office in the interim with any  problems.  Plan reviewed with Dr. Benay Spice.    Owens Shark ANP/GNP-BC   12/22/2013  1:18 PM

## 2013-12-26 DIAGNOSIS — Z85828 Personal history of other malignant neoplasm of skin: Secondary | ICD-10-CM | POA: Diagnosis not present

## 2013-12-26 DIAGNOSIS — D485 Neoplasm of uncertain behavior of skin: Secondary | ICD-10-CM | POA: Diagnosis not present

## 2013-12-28 DIAGNOSIS — R197 Diarrhea, unspecified: Secondary | ICD-10-CM | POA: Diagnosis not present

## 2013-12-28 DIAGNOSIS — R109 Unspecified abdominal pain: Secondary | ICD-10-CM | POA: Diagnosis not present

## 2013-12-28 DIAGNOSIS — Z79899 Other long term (current) drug therapy: Secondary | ICD-10-CM | POA: Diagnosis not present

## 2013-12-28 DIAGNOSIS — Z1331 Encounter for screening for depression: Secondary | ICD-10-CM | POA: Diagnosis not present

## 2013-12-28 DIAGNOSIS — Z0000 Encounter for general adult medical examination without abnormal findings: Secondary | ICD-10-CM | POA: Diagnosis not present

## 2013-12-28 DIAGNOSIS — D649 Anemia, unspecified: Secondary | ICD-10-CM | POA: Diagnosis not present

## 2014-01-03 DIAGNOSIS — Z961 Presence of intraocular lens: Secondary | ICD-10-CM | POA: Diagnosis not present

## 2014-01-09 DIAGNOSIS — H906 Mixed conductive and sensorineural hearing loss, bilateral: Secondary | ICD-10-CM | POA: Diagnosis not present

## 2014-01-09 DIAGNOSIS — H7200 Central perforation of tympanic membrane, unspecified ear: Secondary | ICD-10-CM | POA: Diagnosis not present

## 2014-01-22 ENCOUNTER — Ambulatory Visit: Payer: Medicare Other | Admitting: Oncology

## 2014-01-22 ENCOUNTER — Telehealth: Payer: Self-pay | Admitting: Oncology

## 2014-01-22 ENCOUNTER — Other Ambulatory Visit: Payer: Medicare Other

## 2014-01-22 NOTE — Telephone Encounter (Signed)
returned pt call to r/s missed appt...lvm with new d.t....mailed pt new appt sched and letter

## 2014-02-07 DIAGNOSIS — M1710 Unilateral primary osteoarthritis, unspecified knee: Secondary | ICD-10-CM | POA: Diagnosis not present

## 2014-02-07 DIAGNOSIS — M899 Disorder of bone, unspecified: Secondary | ICD-10-CM | POA: Diagnosis not present

## 2014-02-07 DIAGNOSIS — M949 Disorder of cartilage, unspecified: Secondary | ICD-10-CM | POA: Diagnosis not present

## 2014-02-07 DIAGNOSIS — Z780 Asymptomatic menopausal state: Secondary | ICD-10-CM | POA: Diagnosis not present

## 2014-02-12 ENCOUNTER — Other Ambulatory Visit: Payer: Self-pay

## 2014-02-12 DIAGNOSIS — L299 Pruritus, unspecified: Secondary | ICD-10-CM | POA: Diagnosis not present

## 2014-02-12 DIAGNOSIS — M899 Disorder of bone, unspecified: Secondary | ICD-10-CM | POA: Diagnosis not present

## 2014-02-12 DIAGNOSIS — Z6841 Body Mass Index (BMI) 40.0 and over, adult: Secondary | ICD-10-CM | POA: Diagnosis not present

## 2014-02-12 DIAGNOSIS — Z1231 Encounter for screening mammogram for malignant neoplasm of breast: Secondary | ICD-10-CM

## 2014-02-12 DIAGNOSIS — M949 Disorder of cartilage, unspecified: Secondary | ICD-10-CM | POA: Diagnosis not present

## 2014-02-14 DIAGNOSIS — M1710 Unilateral primary osteoarthritis, unspecified knee: Secondary | ICD-10-CM | POA: Diagnosis not present

## 2014-02-19 ENCOUNTER — Telehealth: Payer: Self-pay | Admitting: Oncology

## 2014-02-19 NOTE — Telephone Encounter (Signed)
Pt cld lft vm to confirm next apt, lft msg for pt advising of  apt and will be sending copy of schedule in the mail.Marland Kitchen..KJ

## 2014-02-20 DIAGNOSIS — Z85828 Personal history of other malignant neoplasm of skin: Secondary | ICD-10-CM | POA: Diagnosis not present

## 2014-02-21 DIAGNOSIS — M1710 Unilateral primary osteoarthritis, unspecified knee: Secondary | ICD-10-CM | POA: Diagnosis not present

## 2014-02-27 ENCOUNTER — Ambulatory Visit: Payer: Medicare Other | Admitting: Oncology

## 2014-02-27 ENCOUNTER — Other Ambulatory Visit: Payer: Medicare Other

## 2014-03-05 ENCOUNTER — Other Ambulatory Visit (HOSPITAL_BASED_OUTPATIENT_CLINIC_OR_DEPARTMENT_OTHER): Payer: Medicare Other

## 2014-03-05 DIAGNOSIS — C8589 Other specified types of non-Hodgkin lymphoma, extranodal and solid organ sites: Secondary | ICD-10-CM

## 2014-03-05 LAB — COMPREHENSIVE METABOLIC PANEL (CC13)
ALT: 16 U/L (ref 0–55)
AST: 17 U/L (ref 5–34)
Albumin: 3 g/dL — ABNORMAL LOW (ref 3.5–5.0)
Alkaline Phosphatase: 78 U/L (ref 40–150)
Anion Gap: 6 mEq/L (ref 3–11)
BUN: 11.6 mg/dL (ref 7.0–26.0)
CO2: 27 mEq/L (ref 22–29)
Calcium: 9 mg/dL (ref 8.4–10.4)
Chloride: 109 mEq/L (ref 98–109)
Creatinine: 0.8 mg/dL (ref 0.6–1.1)
Glucose: 98 mg/dl (ref 70–140)
Potassium: 3.8 mEq/L (ref 3.5–5.1)
Sodium: 142 mEq/L (ref 136–145)
Total Bilirubin: 0.68 mg/dL (ref 0.20–1.20)
Total Protein: 8.1 g/dL (ref 6.4–8.3)

## 2014-03-05 LAB — CBC WITH DIFFERENTIAL/PLATELET
BASO%: 0.7 % (ref 0.0–2.0)
Basophils Absolute: 0 10*3/uL (ref 0.0–0.1)
EOS%: 1.7 % (ref 0.0–7.0)
Eosinophils Absolute: 0.1 10*3/uL (ref 0.0–0.5)
HCT: 31.5 % — ABNORMAL LOW (ref 34.8–46.6)
HGB: 10.3 g/dL — ABNORMAL LOW (ref 11.6–15.9)
LYMPH%: 47.4 % (ref 14.0–49.7)
MCH: 28.8 pg (ref 25.1–34.0)
MCHC: 32.7 g/dL (ref 31.5–36.0)
MCV: 88.1 fL (ref 79.5–101.0)
MONO#: 0.2 10*3/uL (ref 0.1–0.9)
MONO%: 5.5 % (ref 0.0–14.0)
NEUT#: 1.7 10*3/uL (ref 1.5–6.5)
NEUT%: 44.7 % (ref 38.4–76.8)
Platelets: 216 10*3/uL (ref 145–400)
RBC: 3.58 10*6/uL — ABNORMAL LOW (ref 3.70–5.45)
RDW: 16.9 % — ABNORMAL HIGH (ref 11.2–14.5)
WBC: 3.8 10*3/uL — ABNORMAL LOW (ref 3.9–10.3)
lymph#: 1.8 10*3/uL (ref 0.9–3.3)

## 2014-03-07 LAB — VISCOSITY, SERUM: Viscosity, Serum: 2.5 rel to H2O — ABNORMAL HIGH (ref 1.5–1.9)

## 2014-03-07 LAB — IGM: IgM, Serum: 3810 mg/dL — ABNORMAL HIGH (ref 52–322)

## 2014-03-08 ENCOUNTER — Telehealth: Payer: Self-pay | Admitting: *Deleted

## 2014-03-08 NOTE — Telephone Encounter (Signed)
Message copied by Brien Few on Thu Mar 08, 2014  8:48 AM ------      Message from: [name] B      Created: Wed Mar 07, 2014  8:26 PM       Please call patient, [name] slightly higher, other labs stable, f/u as scheduled      Call for new symptoms ------

## 2014-03-08 NOTE — Telephone Encounter (Signed)
Pt returned call, lab results given. She voiced understanding, knows to call office for new symptoms.

## 2014-03-13 ENCOUNTER — Ambulatory Visit
Admission: RE | Admit: 2014-03-13 | Discharge: 2014-03-13 | Disposition: A | Discharge status: Home / Self Care | Payer: Medicare Other | Source: Ambulatory Visit

## 2014-03-13 DIAGNOSIS — Z1231 Encounter for screening mammogram for malignant neoplasm of breast: Secondary | ICD-10-CM | POA: Diagnosis not present

## 2014-03-16 DIAGNOSIS — H5710 Ocular pain, unspecified eye: Secondary | ICD-10-CM | POA: Diagnosis not present

## 2014-03-29 ENCOUNTER — Encounter: Payer: Self-pay | Admitting: *Deleted

## 2014-04-18 ENCOUNTER — Telehealth: Payer: Self-pay | Admitting: *Deleted

## 2014-04-18 NOTE — Telephone Encounter (Signed)
Pt called to say she has been increasingly fatigued over the last 2-3 weeks. She had a physical with Dr Felipa Eth in August and everything was fine. She is wondering if she needs to have repeat labs.

## 2014-04-19 ENCOUNTER — Other Ambulatory Visit: Payer: Self-pay | Admitting: *Deleted

## 2014-04-19 ENCOUNTER — Telehealth: Payer: Self-pay | Admitting: Oncology

## 2014-04-19 DIAGNOSIS — C8588 Other specified types of non-Hodgkin lymphoma, lymph nodes of multiple sites: Secondary | ICD-10-CM

## 2014-04-19 DIAGNOSIS — C880 Waldenstrom macroglobulinemia: Secondary | ICD-10-CM | POA: Insufficient documentation

## 2014-04-19 NOTE — Telephone Encounter (Signed)
per pof to sch pt appt-cld & left a message for time & date for appt

## 2014-04-26 ENCOUNTER — Other Ambulatory Visit (HOSPITAL_BASED_OUTPATIENT_CLINIC_OR_DEPARTMENT_OTHER): Payer: Medicare Other

## 2014-04-26 ENCOUNTER — Other Ambulatory Visit: Payer: Self-pay | Admitting: *Deleted

## 2014-04-26 ENCOUNTER — Encounter: Payer: Self-pay | Admitting: Oncology

## 2014-04-26 ENCOUNTER — Ambulatory Visit (HOSPITAL_BASED_OUTPATIENT_CLINIC_OR_DEPARTMENT_OTHER): Payer: Medicare Other | Admitting: Oncology

## 2014-04-26 ENCOUNTER — Encounter: Payer: Self-pay | Admitting: *Deleted

## 2014-04-26 VITALS — BP 134/61 | HR 75 | Temp 98.1°F | Resp 20 | Ht 66.0 in | Wt 262.7 lb

## 2014-04-26 DIAGNOSIS — D649 Anemia, unspecified: Secondary | ICD-10-CM

## 2014-04-26 DIAGNOSIS — C8589 Other specified types of non-Hodgkin lymphoma, extranodal and solid organ sites: Secondary | ICD-10-CM

## 2014-04-26 DIAGNOSIS — D539 Nutritional anemia, unspecified: Secondary | ICD-10-CM

## 2014-04-26 DIAGNOSIS — R5381 Other malaise: Secondary | ICD-10-CM | POA: Diagnosis not present

## 2014-04-26 DIAGNOSIS — G4730 Sleep apnea, unspecified: Secondary | ICD-10-CM | POA: Diagnosis not present

## 2014-04-26 DIAGNOSIS — C880 Waldenstrom macroglobulinemia: Secondary | ICD-10-CM

## 2014-04-26 DIAGNOSIS — R5383 Other fatigue: Secondary | ICD-10-CM

## 2014-04-26 DIAGNOSIS — C8588 Other specified types of non-Hodgkin lymphoma, lymph nodes of multiple sites: Secondary | ICD-10-CM

## 2014-04-26 DIAGNOSIS — R5382 Chronic fatigue, unspecified: Secondary | ICD-10-CM

## 2014-04-26 DIAGNOSIS — R599 Enlarged lymph nodes, unspecified: Secondary | ICD-10-CM | POA: Diagnosis not present

## 2014-04-26 DIAGNOSIS — E259 Adrenogenital disorder, unspecified: Secondary | ICD-10-CM

## 2014-04-26 LAB — COMPREHENSIVE METABOLIC PANEL (CC13)
ALT: 7 U/L (ref 0–55)
AST: 11 U/L (ref 5–34)
Albumin: 3 g/dL — ABNORMAL LOW (ref 3.5–5.0)
Alkaline Phosphatase: 74 U/L (ref 40–150)
Anion Gap: 8 mEq/L (ref 3–11)
BUN: 14.3 mg/dL (ref 7.0–26.0)
CO2: 27 mEq/L (ref 22–29)
Calcium: 9.5 mg/dL (ref 8.4–10.4)
Chloride: 109 mEq/L (ref 98–109)
Creatinine: 0.8 mg/dL (ref 0.6–1.1)
Glucose: 100 mg/dl (ref 70–140)
Potassium: 4 mEq/L (ref 3.5–5.1)
Sodium: 144 mEq/L (ref 136–145)
Total Bilirubin: 0.69 mg/dL (ref 0.20–1.20)
Total Protein: 8.6 g/dL — ABNORMAL HIGH (ref 6.4–8.3)

## 2014-04-26 LAB — CBC WITH DIFFERENTIAL/PLATELET
BASO%: 0.4 % (ref 0.0–2.0)
Basophils Absolute: 0 10*3/uL (ref 0.0–0.1)
EOS%: 1.8 % (ref 0.0–7.0)
Eosinophils Absolute: 0.1 10*3/uL (ref 0.0–0.5)
HCT: 31.8 % — ABNORMAL LOW (ref 34.8–46.6)
HGB: 10.5 g/dL — ABNORMAL LOW (ref 11.6–15.9)
LYMPH%: 47.3 % (ref 14.0–49.7)
MCH: 30 pg (ref 25.1–34.0)
MCHC: 32.9 g/dL (ref 31.5–36.0)
MCV: 91.1 fL (ref 79.5–101.0)
MONO#: 0.2 10*3/uL (ref 0.1–0.9)
MONO%: 5.1 % (ref 0.0–14.0)
NEUT#: 1.6 10*3/uL (ref 1.5–6.5)
NEUT%: 45.4 % (ref 38.4–76.8)
Platelets: 229 10*3/uL (ref 145–400)
RBC: 3.48 10*6/uL — ABNORMAL LOW (ref 3.70–5.45)
RDW: 16.3 % — ABNORMAL HIGH (ref 11.2–14.5)
WBC: 3.4 10*3/uL — ABNORMAL LOW (ref 3.9–10.3)
lymph#: 1.6 10*3/uL (ref 0.9–3.3)

## 2014-04-26 NOTE — Patient Instructions (Signed)
Please provide office with copy of Advanced Directive to next visit to be scanned into chart.

## 2014-04-26 NOTE — Progress Notes (Signed)
Insurance does not cover vitamin B12 level except 1/year and last was done on 04/29/13. Result at that time was high at 1933. OK to check at next visit per MD. Dx: 281.9 Sent MyChart message to patient.

## 2014-04-26 NOTE — Progress Notes (Signed)
  [facility] OFFICE PROGRESS NOTE   Diagnosis: Waldenstrm's macroglobulinemia  INTERVAL HISTORY:   She returns prior to a scheduled visit. Ms. Cumbo complains of increased malaise over the past several months. Stable exertional dyspnea. No fever or infection. No change in the left submandibular gland nodule.  Objective:  Vital signs in last 24 hours:  Blood pressure 134/61, pulse 75, temperature 98.1 F (36.7 C), temperature source Oral, resp. rate 20, height 5\' 6"  (1.676 m), weight 262 lb 11.2 oz (119.16 kg).    HEENT: 1 cm prominence at the left submandibular gland, oropharynx without visible mass-difficult to visualize the) Lymphatics: No cervical, supra-clavicular, axillary, or inguinal nodes Resp: End inspiratory rhonchi at the left posterior base, no respiratory distress Cardio: Regular rate and rhythm GI: No hepatosplenomegaly, nontender, no mass Vascular: No leg edema  Lab Results:  Lab Results  Component Value Date   WBC 3.4* 04/26/2014   HGB 10.5* 04/26/2014   HCT 31.8* 04/26/2014   MCV 91.1 04/26/2014   PLT 229 04/26/2014   NEUTROABS 1.6 04/26/2014    03/06/1999 HEENT: Serum viscosity 2.5, [name] 3810   Imaging:  No results found.  Medications: I have reviewed the patient's current medications.  Assessment/Plan: 1. 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 Treated with multiple systemic therapies most recently pentostatin/Cytoxan/rituximab in 2010. 2. Left knee arthritis. Followed by Dr. Wynelle Link. 3. Sleep apnea. 4. Anemia. Chronic. 5. Question left parotitis 12/12/2013 presenting with left facial and parotid edema.   Disposition:  She complains of increased malaise. There is no clinical evidence for progression of the lymphoma. The [name] level was slightly higher  last month with a stable serum viscosity. The hemoglobin is slightly lower over the past year.  We will followup on the [name] level and serum viscosity from today. I recommend continued observation for the lymphoma. She will return as scheduled 06/26/2014.  She reports being up-to-date on the influenza and pneumococcal vaccines.  I suspect the malaise is related to comorbid conditions including sleep apnea. We will consider a trial of systemic therapy for low-grade lymphoma if she develops progressive anemia and a further rise in the [name] level.  [name], MD  04/26/2014  11:45 AM

## 2014-04-27 ENCOUNTER — Telehealth: Payer: Self-pay | Admitting: Oncology

## 2014-04-27 NOTE — Telephone Encounter (Signed)
, °

## 2014-04-30 LAB — VISCOSITY, SERUM: Viscosity, Serum: 2.5 rel to H2O — ABNORMAL HIGH (ref 1.5–1.9)

## 2014-04-30 LAB — IGM: IgM, Serum: 4240 mg/dL — ABNORMAL HIGH (ref 52–322)

## 2014-05-07 ENCOUNTER — Telehealth: Payer: Self-pay | Admitting: *Deleted

## 2014-05-07 NOTE — Telephone Encounter (Signed)
Message copied by Tania Ade on Mon May 07, 2014  9:34 AM ------      Message from: [name] B      Created: Fri May 04, 2014  4:32 PM       Please call patient, [name] is drifting higher, serum viscosity is stable, I think we should consider starting systemic therapy if she continue to have fatigue.            Schedule office next few weeks sherrill or lisa to discuss options ------

## 2014-05-07 NOTE — Telephone Encounter (Signed)
Made Natasha Ellis aware that serum viscosity is stable and [name] is higher. MD suggests she be seen in few weeks to discuss treatment options-may need to consider systemic therapy if she is still fatigued. She understands and agrees. POF to scheduler. She needs to avoid Tues/Thurs afternoons.

## 2014-05-09 ENCOUNTER — Telehealth: Payer: Self-pay | Admitting: Oncology

## 2014-05-09 NOTE — Telephone Encounter (Signed)
Pt confirmed MD visit per 10/05 POF, pt wanted to avoid Tues/thurs pm, 11/02 @12 :15 was already taken same day sent msg to MD requesting new D/T pt called back stated she wanted to avoid same time and also these dates 12th, 14th 21st, and 30th., spk w/Tanya and the only thing we could find was 10/23 @11 :45 for a 30 min, pt confirmed ok......Marland Kitchen KJ

## 2014-05-11 ENCOUNTER — Telehealth: Payer: Self-pay | Admitting: Oncology

## 2014-05-11 NOTE — Telephone Encounter (Signed)
Close encounter 

## 2014-05-11 NOTE — Telephone Encounter (Signed)
Lft msg for pt confirming MD visit r/s per GBS, mailed updated sch to pt.... KJ

## 2014-05-12 ENCOUNTER — Encounter: Payer: Self-pay | Admitting: Oncology

## 2014-05-19 ENCOUNTER — Encounter: Payer: Self-pay | Admitting: Oncology

## 2014-05-21 ENCOUNTER — Other Ambulatory Visit: Payer: Self-pay | Admitting: *Deleted

## 2014-05-21 ENCOUNTER — Telehealth: Payer: Self-pay | Admitting: *Deleted

## 2014-05-21 DIAGNOSIS — C880 Waldenstrom macroglobulinemia: Secondary | ICD-10-CM

## 2014-05-21 NOTE — Telephone Encounter (Signed)
Per Dr. Benay Spice : Will see her on 10/21 at 0830 with labs prior. Called patient and instructed her to come in at 0800 on Wednesday for lab/OV>

## 2014-05-23 ENCOUNTER — Other Ambulatory Visit: Payer: Medicare Other

## 2014-05-23 ENCOUNTER — Other Ambulatory Visit (HOSPITAL_BASED_OUTPATIENT_CLINIC_OR_DEPARTMENT_OTHER): Payer: Medicare Other

## 2014-05-23 ENCOUNTER — Ambulatory Visit (HOSPITAL_BASED_OUTPATIENT_CLINIC_OR_DEPARTMENT_OTHER): Payer: Medicare Other | Admitting: Oncology

## 2014-05-23 ENCOUNTER — Telehealth: Payer: Self-pay | Admitting: Oncology

## 2014-05-23 ENCOUNTER — Telehealth: Payer: Self-pay | Admitting: *Deleted

## 2014-05-23 VITALS — BP 143/71 | HR 71 | Temp 97.5°F | Resp 20 | Ht 66.0 in | Wt 261.0 lb

## 2014-05-23 DIAGNOSIS — C880 Waldenstrom macroglobulinemia: Secondary | ICD-10-CM

## 2014-05-23 DIAGNOSIS — D6489 Other specified anemias: Secondary | ICD-10-CM | POA: Diagnosis not present

## 2014-05-23 LAB — COMPREHENSIVE METABOLIC PANEL (CC13)
ALT: 14 U/L (ref 0–55)
AST: 14 U/L (ref 5–34)
Albumin: 3 g/dL — ABNORMAL LOW (ref 3.5–5.0)
Alkaline Phosphatase: 76 U/L (ref 40–150)
Anion Gap: 8 mEq/L (ref 3–11)
BUN: 19 mg/dL (ref 7.0–26.0)
CO2: 25 mEq/L (ref 22–29)
Calcium: 9.6 mg/dL (ref 8.4–10.4)
Chloride: 110 mEq/L — ABNORMAL HIGH (ref 98–109)
Creatinine: 0.8 mg/dL (ref 0.6–1.1)
Glucose: 106 mg/dl (ref 70–140)
Potassium: 3.9 mEq/L (ref 3.5–5.1)
Sodium: 142 mEq/L (ref 136–145)
Total Bilirubin: 0.83 mg/dL (ref 0.20–1.20)
Total Protein: 8.8 g/dL — ABNORMAL HIGH (ref 6.4–8.3)

## 2014-05-23 LAB — CBC WITH DIFFERENTIAL/PLATELET
BASO%: 0.4 % (ref 0.0–2.0)
Basophils Absolute: 0 10*3/uL (ref 0.0–0.1)
EOS%: 3 % (ref 0.0–7.0)
Eosinophils Absolute: 0.2 10*3/uL (ref 0.0–0.5)
HCT: 29.5 % — ABNORMAL LOW (ref 34.8–46.6)
HGB: 10.6 g/dL — ABNORMAL LOW (ref 11.6–15.9)
LYMPH%: 50.7 % — ABNORMAL HIGH (ref 14.0–49.7)
MCH: 35.5 pg — ABNORMAL HIGH (ref 25.1–34.0)
MCHC: 35.9 g/dL (ref 31.5–36.0)
MCV: 98.7 fL (ref 79.5–101.0)
MONO#: 0.3 10*3/uL (ref 0.1–0.9)
MONO%: 5.4 % (ref 0.0–14.0)
NEUT#: 2.2 10*3/uL (ref 1.5–6.5)
NEUT%: 40.5 % (ref 38.4–76.8)
Platelets: 209 10*3/uL (ref 145–400)
RBC: 2.99 10*6/uL — ABNORMAL LOW (ref 3.70–5.45)
WBC: 5.4 10*3/uL (ref 3.9–10.3)
lymph#: 2.7 10*3/uL (ref 0.9–3.3)

## 2014-05-23 NOTE — Progress Notes (Signed)
  [facility] OFFICE PROGRESS NOTE   Diagnosis: Waldenstrm's macroglobulinemia  INTERVAL HISTORY:   Natasha Ellis returns prior to a scheduled visit. She complains of progressive malaise. No fever or night sweats. She has exertional dyspnea.  Objective:  Vital signs in last 24 hours:  Blood pressure 143/71, pulse 71, temperature 97.5 F (36.4 C), temperature source Oral, resp. rate 20, height 5\' 6"  (1.676 m), weight 261 lb (118.389 kg), SpO2 97.00%.    HEENT: Neck without mass, 1 cm prominence at the left submandibular gland. Lymphatics: No cervical, supraclavicular, axillary, or inguinal nodes Resp: End inspiratory rhonchi at the left base. Scattered end inspiratory coarse rhonchi elsewhere. No respiratory distress. Cardio: Regular rate and rhythm GI: No hepatomegaly, no mass Vascular: No leg edema Neuro: Alert and oriented, follows commands, the motor exam appears grossly intact       Lab Results:  Lab Results  Component Value Date   WBC 5.4 05/23/2014   HGB 10.6* 05/23/2014   HCT 29.5* 05/23/2014   MCV 98.7 05/23/2014   PLT 209 05/23/2014   NEUTROABS 2.2 05/23/2014   04/26/2014-[name] 4240, serum viscosity 2.5, creatinine 0.8  Medications: I have reviewed the patient's current medications.  Assessment/Plan: 1. 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 Treated with multiple systemic therapies most recently pentostatin/Cytoxan/rituximab in 2010. 2. Left knee arthritis. Followed by Dr. Wynelle Link. 3. Sleep apnea. 4. Anemia. Chronic. 5. Question left parotitis 12/12/2013 presenting with left facial and parotid edema.  Disposition:  She complains of progressive malaise. The [name] is higher and the hemoglobin is slightly lower. I suspect her symptoms are related to progression of the  Waldenstrm's macroglobulinemia. We decided to begin treatment for the Waldenstrm's. She understands it is possible for malaise is related to another etiology such as chronic pulmonary disease.  She was last treated with pentostatin, Cytoxan, and rituximab in 2010. I recommend a trial of bendamustine/rituximab. We reviewed the potential toxicities associated with bendamustine including the chance for nausea/vomiting, mucositis, diarrhea, and hematologic toxicity. We discussed the allergic reaction, infection risk, neutropenia, and leukoencephalopathy seen with rituximab. She agrees to proceed. She will attend a chemotherapy teaching class today. We discussed treatment with Velcade or ibrutinib if she does not respond to the bendamustine/rituximab.  Natasha Ellis will be scheduled for a first cycle of bendamustine/rituximab 05/30/2014. She will return for an office visit and nadir CBC on 06/13/2014.  Approximately 40 minutes were spent with patient today. The majority of the time was used for counseling and coordination of care.  [name], MD  05/23/2014  9:21 AM

## 2014-05-23 NOTE — Telephone Encounter (Signed)
gv pt appt schedule for oct/nov. per 10/21 pof 11/4 lab should be moved to 11/11. message to BS that tx started 10/29 due to no availability 10/28.

## 2014-05-23 NOTE — Telephone Encounter (Signed)
Per staff message and POF I have scheduled appts. Advised scheduler of appts. JMW  

## 2014-05-25 ENCOUNTER — Ambulatory Visit: Payer: Medicare Other | Admitting: Oncology

## 2014-05-25 LAB — PROTEIN ELECTROPHORESIS, SERUM
Albumin ELP: 43.3 % — ABNORMAL LOW (ref 55.8–66.1)
Alpha-1-Globulin: 4.4 % (ref 2.9–4.9)
Alpha-2-Globulin: 9.5 % (ref 7.1–11.8)
Beta 2: 2.6 % — ABNORMAL LOW (ref 3.2–6.5)
Beta Globulin: 4.7 % (ref 4.7–7.2)
Gamma Globulin: 35.5 % — ABNORMAL HIGH (ref 11.1–18.8)
M-Spike, %: 2.79 g/dL
Total Protein, Serum Electrophoresis: 8.6 g/dL — ABNORMAL HIGH (ref 6.0–8.3)

## 2014-05-25 LAB — VISCOSITY, SERUM: Viscosity, Serum: 2.6 rel to H2O — ABNORMAL HIGH (ref 1.5–1.9)

## 2014-05-25 LAB — IGM: IgM, Serum: 4860 mg/dL — ABNORMAL HIGH (ref 52–322)

## 2014-05-27 ENCOUNTER — Other Ambulatory Visit: Payer: Self-pay | Admitting: Oncology

## 2014-05-29 ENCOUNTER — Telehealth: Payer: Self-pay | Admitting: *Deleted

## 2014-05-29 ENCOUNTER — Telehealth: Payer: Self-pay | Admitting: Oncology

## 2014-05-29 NOTE — Telephone Encounter (Signed)
Per 10/27 POF labs added pt is aware..... KJ

## 2014-05-29 NOTE — Telephone Encounter (Signed)
Hepatitis serologies were not drawn 10/21. Pt was not sent back to lab. Will need these drawn prior to Rituxan tx. Called pt, she voiced understanding. She is unable to come today. Requests 10/28 at [place]. Orders entered for lab appt.

## 2014-05-30 ENCOUNTER — Other Ambulatory Visit (HOSPITAL_BASED_OUTPATIENT_CLINIC_OR_DEPARTMENT_OTHER): Payer: Medicare Other

## 2014-05-30 ENCOUNTER — Encounter: Payer: Self-pay | Admitting: General Practice

## 2014-05-30 DIAGNOSIS — C8800 Waldenstrom macroglobulinemia not having achieved remission: Secondary | ICD-10-CM

## 2014-05-30 LAB — CBC WITH DIFFERENTIAL/PLATELET
BASO%: 0.2 % (ref 0.0–2.0)
Basophils Absolute: 0 10*3/uL (ref 0.0–0.1)
EOS%: 1.7 % (ref 0.0–7.0)
Eosinophils Absolute: 0.1 10*3/uL (ref 0.0–0.5)
HCT: 32.1 % — ABNORMAL LOW (ref 34.8–46.6)
HGB: 10.3 g/dL — ABNORMAL LOW (ref 11.6–15.9)
LYMPH%: 41.7 % (ref 14.0–49.7)
MCH: 27.3 pg (ref 25.1–34.0)
MCHC: 32 g/dL (ref 31.5–36.0)
MCV: 85.5 fL (ref 79.5–101.0)
MONO#: 0.2 10*3/uL (ref 0.1–0.9)
MONO%: 5.7 % (ref 0.0–14.0)
NEUT#: 1.8 10*3/uL (ref 1.5–6.5)
NEUT%: 50.7 % (ref 38.4–76.8)
Platelets: 201 10*3/uL (ref 145–400)
RBC: 3.76 10*6/uL (ref 3.70–5.45)
RDW: 16.1 % — ABNORMAL HIGH (ref 11.2–14.5)
WBC: 3.5 10*3/uL — ABNORMAL LOW (ref 3.9–10.3)
lymph#: 1.5 10*3/uL (ref 0.9–3.3)

## 2014-05-30 LAB — BASIC METABOLIC PANEL (CC13)
Anion Gap: 7 mEq/L (ref 3–11)
BUN: 12.8 mg/dL (ref 7.0–26.0)
CO2: 24 mEq/L (ref 22–29)
Calcium: 9 mg/dL (ref 8.4–10.4)
Chloride: 109 mEq/L (ref 98–109)
Creatinine: 0.8 mg/dL (ref 0.6–1.1)
Glucose: 118 mg/dl (ref 70–140)
Potassium: 4 mEq/L (ref 3.5–5.1)
Sodium: 140 mEq/L (ref 136–145)

## 2014-05-30 LAB — HEPATITIS B SURFACE ANTIBODY,QUALITATIVE

## 2014-05-30 LAB — HEPATITIS C ANTIBODY: HCV Ab: NEGATIVE

## 2014-05-30 LAB — HEPATITIS B CORE ANTIBODY, TOTAL: Hep B Core Total Ab: NONREACTIVE

## 2014-05-30 NOTE — Progress Notes (Signed)
Met Ms Sheets and daughter [name] in chemo class, introducing [facility] team and resources.  Pt/family used personal visit time to share and process emotional concerns, support system, and personal coping tools.  Pt/family aware of ongoing chaplain availability, but please also page as needs arise:  [phone].  Thank you.  [place], [place]

## 2014-05-31 ENCOUNTER — Ambulatory Visit (HOSPITAL_BASED_OUTPATIENT_CLINIC_OR_DEPARTMENT_OTHER): Payer: Medicare Other | Admitting: Nurse Practitioner

## 2014-05-31 ENCOUNTER — Other Ambulatory Visit: Payer: Self-pay | Admitting: *Deleted

## 2014-05-31 ENCOUNTER — Encounter: Payer: Self-pay | Admitting: Nurse Practitioner

## 2014-05-31 ENCOUNTER — Ambulatory Visit (HOSPITAL_BASED_OUTPATIENT_CLINIC_OR_DEPARTMENT_OTHER): Payer: Medicare Other

## 2014-05-31 VITALS — BP 121/62 | HR 81 | Temp 98.6°F | Resp 18 | Ht 66.0 in

## 2014-05-31 DIAGNOSIS — C880 Waldenstrom macroglobulinemia: Secondary | ICD-10-CM

## 2014-05-31 DIAGNOSIS — T7840XA Allergy, unspecified, initial encounter: Secondary | ICD-10-CM | POA: Diagnosis not present

## 2014-05-31 DIAGNOSIS — Z5111 Encounter for antineoplastic chemotherapy: Secondary | ICD-10-CM

## 2014-05-31 DIAGNOSIS — Z5112 Encounter for antineoplastic immunotherapy: Secondary | ICD-10-CM

## 2014-05-31 MED ORDER — DEXAMETHASONE SODIUM PHOSPHATE 10 MG/ML IJ SOLN
10.0000 mg | Freq: Once | INTRAMUSCULAR | Status: AC
  Administered 2014-05-31: 10 mg via INTRAVENOUS

## 2014-05-31 MED ORDER — SODIUM CHLORIDE 0.9 % IV SOLN
Freq: Once | INTRAVENOUS | Status: AC
  Administered 2014-05-31: 09:00:00 via INTRAVENOUS

## 2014-05-31 MED ORDER — FAMOTIDINE IN NACL 20-0.9 MG/50ML-% IV SOLN
20.0000 mg | Freq: Once | INTRAVENOUS | Status: AC | PRN
  Administered 2014-05-31: 20 mg via INTRAVENOUS

## 2014-05-31 MED ORDER — MEPERIDINE HCL 25 MG/ML IJ SOLN
25.0000 mg | Freq: Once | INTRAMUSCULAR | Status: AC
  Administered 2014-05-31: 25 mg via INTRAVENOUS

## 2014-05-31 MED ORDER — SODIUM CHLORIDE 0.9 % IV SOLN
Freq: Once | INTRAVENOUS | Status: AC | PRN
  Administered 2014-05-31: 11:00:00 via INTRAVENOUS

## 2014-05-31 MED ORDER — SODIUM CHLORIDE 0.9 % IV SOLN
70.0000 mg/m2 | Freq: Once | INTRAVENOUS | Status: AC
  Administered 2014-05-31: 162 mg via INTRAVENOUS
  Filled 2014-05-31: qty 1.8

## 2014-05-31 MED ORDER — ACETAMINOPHEN 325 MG PO TABS
ORAL_TABLET | ORAL | Status: AC
  Filled 2014-05-31: qty 2

## 2014-05-31 MED ORDER — METHYLPREDNISOLONE SODIUM SUCC 125 MG IJ SOLR
125.0000 mg | Freq: Once | INTRAMUSCULAR | Status: AC | PRN
  Administered 2014-05-31: 125 mg via INTRAVENOUS

## 2014-05-31 MED ORDER — DIPHENHYDRAMINE HCL 25 MG PO CAPS
ORAL_CAPSULE | ORAL | Status: AC
  Filled 2014-05-31: qty 1

## 2014-05-31 MED ORDER — ONDANSETRON 8 MG/NS 50 ML IVPB
INTRAVENOUS | Status: AC
  Filled 2014-05-31: qty 8

## 2014-05-31 MED ORDER — SODIUM CHLORIDE 0.9 % IV SOLN
375.0000 mg/m2 | Freq: Once | INTRAVENOUS | Status: AC
  Administered 2014-05-31: 900 mg via INTRAVENOUS
  Filled 2014-05-31: qty 90

## 2014-05-31 MED ORDER — PROCHLORPERAZINE MALEATE 5 MG PO TABS: 5.0000 mg | ORAL_TABLET | Freq: Four times a day (QID) | ORAL | Status: DC | PRN

## 2014-05-31 MED ORDER — LORAZEPAM 1 MG PO TABS
0.5000 mg | ORAL_TABLET | Freq: Once | ORAL | Status: AC
  Administered 2014-05-31: 0.5 mg via ORAL

## 2014-05-31 MED ORDER — DIPHENHYDRAMINE HCL 50 MG/ML IJ SOLN
25.0000 mg | Freq: Once | INTRAMUSCULAR | Status: AC | PRN
  Administered 2014-05-31: 11:00:00 via INTRAVENOUS

## 2014-05-31 MED ORDER — DIPHENHYDRAMINE HCL 25 MG PO CAPS
50.0000 mg | ORAL_CAPSULE | Freq: Once | ORAL | Status: AC
  Administered 2014-05-31: 25 mg via ORAL

## 2014-05-31 MED ORDER — ONDANSETRON 8 MG/50ML IVPB (CHCC)
8.0000 mg | Freq: Once | INTRAVENOUS | Status: AC
  Administered 2014-05-31: 8 mg via INTRAVENOUS

## 2014-05-31 MED ORDER — LORAZEPAM 1 MG PO TABS
ORAL_TABLET | ORAL | Status: AC
  Filled 2014-05-31: qty 1

## 2014-05-31 MED ORDER — DEXAMETHASONE SODIUM PHOSPHATE 10 MG/ML IJ SOLN
INTRAMUSCULAR | Status: AC
  Filled 2014-05-31: qty 1

## 2014-05-31 MED ORDER — MEPERIDINE HCL 25 MG/ML IJ SOLN
INTRAMUSCULAR | Status: AC
  Filled 2014-05-31: qty 1

## 2014-05-31 MED ORDER — ACETAMINOPHEN 325 MG PO TABS
650.0000 mg | ORAL_TABLET | Freq: Once | ORAL | Status: AC
  Administered 2014-05-31: 650 mg via ORAL

## 2014-05-31 NOTE — Assessment & Plan Note (Signed)
Patient initiated cycle 1, day 1 of her Rituxan/been lasting chemotherapy regimen.  She did experience a hypersensitivity reaction to the Rituxan; which was managed per the hypersensitivity protocol.  Patient was able to complete her chemotherapy today; and has plans to return tomorrow for cycle 1, day 2 of the same regimen.  Patient will return on 06/13/2014 for labs and a followup visit as well.

## 2014-05-31 NOTE — Patient Instructions (Signed)
[facility] Discharge Instructions for Patients Receiving Chemotherapy  Today you received the following chemotherapy agents Rituxan/Treanda.  To help prevent nausea and vomiting after your treatment, we encourage you to take your nausea medication as directed.    If you develop nausea and vomiting that is not controlled by your nausea medication, call the clinic.   BELOW ARE SYMPTOMS THAT SHOULD BE REPORTED IMMEDIATELY:  *FEVER GREATER THAN 100.5 F  *CHILLS WITH OR WITHOUT FEVER  NAUSEA AND VOMITING THAT IS NOT CONTROLLED WITH YOUR NAUSEA MEDICATION  *UNUSUAL SHORTNESS OF BREATH  *UNUSUAL BRUISING OR BLEEDING  TENDERNESS IN MOUTH AND THROAT WITH OR WITHOUT PRESENCE OF ULCERS  *URINARY PROBLEMS  *BOWEL PROBLEMS  UNUSUAL RASH Items with * indicate a potential emergency and should be followed up as soon as possible.  Feel free to call the clinic you have any questions or concerns. The clinic phone number is [phone].  Rituximab injection What is this medicine? RITUXIMAB (ri TUX i mab) is a monoclonal antibody. This medicine changes the way the body's immune system works. It is used commonly to treat non-Hodgkin's lymphoma and other conditions. In cancer cells, this drug targets a specific protein within cancer cells and stops the cancer cells from growing. It is also used to treat rhuematoid arthritis (RA). In RA, this medicine slow the inflammatory process and help reduce joint pain and swelling. This medicine is often used with other cancer or arthritis medications. This medicine may be used for other purposes; ask your health care provider or pharmacist if you have questions. COMMON BRAND NAME(S): Rituxan What should I tell my health care provider before I take this medicine? They need to know if you have any of these conditions: -blood disorders -heart disease -history of hepatitis B -infection (especially a virus infection such as chickenpox, cold  sores, or herpes) -irregular heartbeat -kidney disease -lung or breathing disease, like asthma -lupus -an unusual or allergic reaction to rituximab, mouse proteins, other medicines, foods, dyes, or preservatives -pregnant or trying to get pregnant -breast-feeding How should I use this medicine? This medicine is for infusion into a vein. It is administered in a hospital or clinic by a specially trained health care professional. A special MedGuide will be given to you by the pharmacist with each prescription and refill. Be sure to read this information carefully each time. Talk to your pediatrician regarding the use of this medicine in children. This medicine is not approved for use in children. Overdosage: If you think you have taken too much of this medicine contact a poison control center or emergency room at once. NOTE: This medicine is only for you. Do not share this medicine with others. What if I miss a dose? It is important not to miss a dose. Call your doctor or health care professional if you are unable to keep an appointment. What may interact with this medicine? -cisplatin -medicines for blood pressure -some other medicines for arthritis -vaccines This list may not describe all possible interactions. Give your health care provider a list of all the medicines, herbs, non-prescription drugs, or dietary supplements you use. Also tell them if you smoke, drink alcohol, or use illegal drugs. Some items may interact with your medicine. What should I watch for while using this medicine? Report any side effects that you notice during your treatment right away, such as changes in your breathing, fever, chills, dizziness or lightheadedness. These effects are more common with the first dose. Visit your prescriber or  health care professional for checks on your progress. You will need to have regular blood work. Report any other side effects. The side effects of this medicine can continue after  you finish your treatment. Continue your course of treatment even though you feel ill unless your doctor tells you to stop. Call your doctor or health care professional for advice if you get a fever, chills or sore throat, or other symptoms of a cold or flu. Do not treat yourself. This drug decreases your body's ability to fight infections. Try to avoid being around people who are sick. This medicine may increase your risk to bruise or bleed. Call your doctor or health care professional if you notice any unusual bleeding. Be careful brushing and flossing your teeth or using a toothpick because you may get an infection or bleed more easily. If you have any dental work done, tell your dentist you are receiving this medicine. Avoid taking products that contain aspirin, acetaminophen, ibuprofen, naproxen, or ketoprofen unless instructed by your doctor. These medicines may hide a fever. Do not become pregnant while taking this medicine. Women should inform their doctor if they wish to become pregnant or think they might be pregnant. There is a potential for serious side effects to an unborn child. Talk to your health care professional or pharmacist for more information. Do not breast-feed an infant while taking this medicine. What side effects may I notice from receiving this medicine? Side effects that you should report to your doctor or health care professional as soon as possible: -allergic reactions like skin rash, itching or hives, swelling of the face, lips, or tongue -low blood counts - this medicine may decrease the number of white blood cells, red blood cells and platelets. You may be at increased risk for infections and bleeding. -signs of infection - fever or chills, cough, sore throat, pain or difficulty passing urine -signs of decreased platelets or bleeding - bruising, pinpoint red spots on the skin, black, tarry stools, blood in the urine -signs of decreased red blood cells - unusually weak or  tired, fainting spells, lightheadedness -breathing problems -confused, not responsive -chest pain -fast, irregular heartbeat -feeling faint or lightheaded, falls -mouth sores -redness, blistering, peeling or loosening of the skin, including inside the mouth -stomach pain -swelling of the ankles, feet, or hands -trouble passing urine or change in the amount of urine Side effects that usually do not require medical attention (report to your doctor or other health care professional if they continue or are bothersome): -anxiety -headache -loss of appetite -muscle aches -nausea -night sweats This list may not describe all possible side effects. Call your doctor for medical advice about side effects. You may report side effects to FDA at 1-800-FDA-1088. Where should I keep my medicine? This drug is given in a hospital or clinic and will not be stored at home. NOTE: This sheet is a summary. It may not cover all possible information. If you have questions about this medicine, talk to your doctor, pharmacist, or health care provider.  2015, Elsevier/Gold Standard. (2008-03-19 14:04:59)  Bendamustine Injection What is this medicine? BENDAMUSTINE (BEN da MUS teen) is a chemotherapy drug. It is used to treat chronic lymphocytic leukemia and non-Hodgkin lymphoma. This medicine may be used for other purposes; ask your health care provider or pharmacist if you have questions. COMMON BRAND NAME(S): Treanda What should I tell my health care provider before I take this medicine? They need to know if you have any of these conditions: -  kidney disease -liver disease -an unusual or allergic reaction to bendamustine, mannitol, other medicines, foods, dyes, or preservatives -pregnant or trying to get pregnant -breast-feeding How should I use this medicine? This medicine is for infusion into a vein. It is given by a health care professional in a hospital or clinic setting. Talk to your pediatrician  regarding the use of this medicine in children. Special care may be needed. Overdosage: If you think you have taken too much of this medicine contact a poison control center or emergency room at once. NOTE: This medicine is only for you. Do not share this medicine with others. What if I miss a dose? It is important not to miss your dose. Call your doctor or health care professional if you are unable to keep an appointment. What may interact with this medicine? Do not take this medicine with any of the following medications: -clozapine This medicine may also interact with the following medications: -atazanavir -cimetidine -ciprofloxacin -enoxacin -fluvoxamine -medicines for seizures like carbamazepine and phenobarbital -mexiletine -rifampin -tacrine -thiabendazole -zileuton This list may not describe all possible interactions. Give your health care provider a list of all the medicines, herbs, non-prescription drugs, or dietary supplements you use. Also tell them if you smoke, drink alcohol, or use illegal drugs. Some items may interact with your medicine. What should I watch for while using this medicine? Your condition will be monitored carefully while you are receiving this medicine. This drug may make you feel generally unwell. This is not uncommon, as chemotherapy can affect healthy cells as well as cancer cells. Report any side effects. Continue your course of treatment even though you feel ill unless your doctor tells you to stop. Call your doctor or health care professional for advice if you get a fever, chills or sore throat, or other symptoms of a cold or flu. Do not treat yourself. This drug decreases your body's ability to fight infections. Try to avoid being around people who are sick. This medicine may increase your risk to bruise or bleed. Call your doctor or health care professional if you notice any unusual bleeding. Be careful brushing and flossing your teeth or using a  toothpick because you may get an infection or bleed more easily. If you have any dental work done, tell your dentist you are receiving this medicine. Avoid taking products that contain aspirin, acetaminophen, ibuprofen, naproxen, or ketoprofen unless instructed by your doctor. These medicines may hide a fever. Do not become pregnant while taking this medicine. Women should inform their doctor if they wish to become pregnant or think they might be pregnant. There is a potential for serious side effects to an unborn child. Men should inform their doctors if they wish to father a child. This medicine may lower sperm counts. Talk to your health care professional or pharmacist for more information. Do not breast-feed an infant while taking this medicine. What side effects may I notice from receiving this medicine? Side effects that you should report to your doctor or health care professional as soon as possible: -allergic reactions like skin rash, itching or hives, swelling of the face, lips, or tongue -low blood counts - this medicine may decrease the number of white blood cells, red blood cells and platelets. You may be at increased risk for infections and bleeding. -signs of infection - fever or chills, cough, sore throat, pain or difficulty passing urine -signs of decreased platelets or bleeding - bruising, pinpoint red spots on the skin, black, tarry stools, blood  in the urine -signs of decreased red blood cells - unusually weak or tired, fainting spells, lightheadedness -trouble passing urine or change in the amount of urine Side effects that usually do not require medical attention (report to your doctor or health care professional if they continue or are bothersome): -diarrhea This list may not describe all possible side effects. Call your doctor for medical advice about side effects. You may report side effects to FDA at 1-800-FDA-1088. Where should I keep my medicine? This drug is given in a  hospital or clinic and will not be stored at home. NOTE: This sheet is a summary. It may not cover all possible information. If you have questions about this medicine, talk to your doctor, pharmacist, or health care provider.  2015, Elsevier/Gold Standard. (2011-10-16 14:15:47)

## 2014-05-31 NOTE — Progress Notes (Signed)
SYMPTOM MANAGEMENT CLINIC   HPI: Natasha Ellis 80 y.o. female diagnosed with Waldenstrom's macroglobulinemia.  Here today to initiate Rituxan/bendamustine chemotherapy regimen.    Patient presented today to receive her for cycle of Rituxan/bendamustine chemotherapy regimen.  She experienced a hypersensitivity reaction to the Rituxan portion of her chemotherapy which consisted of rigors and nausea/vomiting.  Patient received Solu-Medrol, Benadryl, Pepcid, Zofran, and Demerol per protocol.  Hypersensitivity reaction was managed in all symptoms resolved.  Patient was able to complete her chemotherapy today.  All vital signs remained stable throughout.  Hypersensitivity Reaction    CURRENT THERAPY: Upcoming Treatment Dates - NON-HODGKINS LYMPHOMA Rituximab D1 / Bendamustine D1,2 q28d Days with orders from any treatment category:  06/01/2014      SCHEDULING COMMUNICATION      ondansetron (ZOFRAN) IVPB 8 mg      dexamethasone (DECADRON) injection 10 mg      bendamustine (TREANDA) 162 mg in sodium chloride 0.9 % 500 mL chemo infusion      sodium chloride 0.9 % injection 10 mL      heparin lock flush 100 unit/mL      heparin lock flush 100 unit/mL      alteplase (CATHFLO ACTIVASE) injection 2 mg      sodium chloride 0.9 % injection 3 mL      0.9 %  sodium chloride infusion 06/28/2014      SCHEDULING COMMUNICATION      ondansetron (ZOFRAN) IVPB 8 mg      dexamethasone (DECADRON) injection 10 mg      acetaminophen (TYLENOL) tablet 650 mg      diphenhydrAMINE (BENADRYL) capsule 50 mg      riTUXimab (RITUXAN) 900 mg in sodium chloride 0.9 % 250 mL (2.6471 mg/mL) chemo infusion      bendamustine (TREANDA) 162 mg in sodium chloride 0.9 % 500 mL chemo infusion      sodium chloride 0.9 % injection 10 mL      heparin lock flush 100 unit/mL      heparin lock flush 100 unit/mL      alteplase (CATHFLO ACTIVASE) injection 2 mg      sodium chloride 0.9 % injection 3 mL      diphenhydrAMINE  (BENADRYL) injection 25 mg      famotidine (PEPCID) IVPB 20 mg      0.9 %  sodium chloride infusion      methylPREDNISolone sodium succinate (SOLU-MEDROL) 125 mg/2 mL injection 125 mg      EPINEPHrine (ADRENALIN) 0.1 MG/ML injection 0.25 mg      EPINEPHrine (ADRENALIN) 0.1 MG/ML injection 0.25 mg      EPINEPHrine (ADRENALIN) injection 0.5 mg      EPINEPHrine (ADRENALIN) injection 0.5 mg      diphenhydrAMINE (BENADRYL) injection 50 mg      albuterol (PROVENTIL) (2.5 MG/3ML) 0.083% nebulizer solution 2.5 mg      0.9 %  sodium chloride infusion      TREATMENT CONDITIONS 06/29/2014      SCHEDULING COMMUNICATION      ondansetron (ZOFRAN) IVPB 8 mg      dexamethasone (DECADRON) injection 10 mg      bendamustine (TREANDA) 162 mg in sodium chloride 0.9 % 500 mL chemo infusion      sodium chloride 0.9 % injection 10 mL      heparin lock flush 100 unit/mL      heparin lock flush 100 unit/mL      alteplase (CATHFLO ACTIVASE) injection 2 mg  sodium chloride 0.9 % injection 3 mL      0.9 %  sodium chloride infusion    ROS  Past Medical History  Diagnosis Date  . Malignant lymphoplasmacytic lymphoma 2004  . Hypertension   . Arthritis   . Hypercholesteremia   . IBS (irritable bowel syndrome)   . Diverticulosis of intestine     H/O  . DJD (degenerative joint disease) of lumbar spine   . Left knee DJD   . Right knee DJD   . Chronic cough   . Asthma   . Cataract     Past Surgical History  Procedure Laterality Date  . Tonsillectomy and adenoidectomy  childhood  . Abdominal hysterectomy  1984    TAH    has Chronic cough; Dyspnea; HTN (hypertension); HLD (hyperlipidemia); Lacunar infarction; OSA on CPAP; GERD (gastroesophageal reflux disease); TIA (transient ischemic attack); Back pain; Asthma; Waldenstrom's macroglobulinemia; and Hypersensitivity reaction on her problem list.     is allergic to codeine; erythromycin; lisinopril; oxybutynin; and sulfa antibiotics.    Medication  List       This list is accurate as of: 05/31/14  4:14 PM.  Always use your most recent med list.               acetaminophen 500 MG tablet  Commonly known as:  TYLENOL  Take 500 mg by mouth every 6 (six) hours as needed for pain.     cholecalciferol 1000 UNITS tablet  Commonly known as:  VITAMIN D  Take 1,000 Units by mouth daily.     cholestyramine 4 GM/DOSE powder  Commonly known as:  QUESTRAN  Take 4 g by mouth daily as needed.     clopidogrel 75 MG tablet  Commonly known as:  PLAVIX  Take 1 tablet (75 mg total) by mouth daily with breakfast.     cyanocobalamin 1000 MCG tablet  Take 100 mcg by mouth daily.     fluticasone 50 MCG/ACT nasal spray  Commonly known as:  FLONASE  Place 1 spray into the nose 2 (two) times daily as needed. Each nostril     furosemide 20 MG tablet  Commonly known as:  LASIX  Take 1 tablet (20 mg total) by mouth daily.     ibuprofen 200 MG tablet  Commonly known as:  ADVIL,MOTRIN  Take 200 mg by mouth every 6 (six) hours as needed for pain.     loperamide 2 MG capsule  Commonly known as:  IMODIUM  Take 1 capsule (2 mg total) by mouth 4 (four) times daily as needed for diarrhea or loose stools.     multivitamin capsule  Take 1 capsule by mouth daily. With Vitamin D 4000     potassium chloride 10 MEQ tablet  Commonly known as:  K-DUR  Take 10 mEq by mouth daily.     prochlorperazine 5 MG tablet  Commonly known as:  COMPAZINE  Take 1 tablet (5 mg total) by mouth every 6 (six) hours as needed for nausea or vomiting.         PHYSICAL EXAMINATION  Vitals: BP 121/62, HR 81, temp 98.6  Physical Exam  Nursing note and vitals reviewed. Constitutional: She is oriented to person, place, and time and well-developed, well-nourished, and in no distress.  HENT:  Head: Normocephalic and atraumatic.  Eyes: Conjunctivae and EOM are normal. Pupils are equal, round, and reactive to light. No scleral icterus.  Neck: Normal range of motion.    Pulmonary/Chest: Effort normal. No respiratory  distress.  Musculoskeletal: Normal range of motion.  Neurological: She is alert and oriented to person, place, and time. Gait normal.  Skin: Skin is warm and dry.  Psychiatric: Affect normal.    LABORATORY DATA:. Appointment on 05/30/2014  Component Date Value Ref Range Status  . Hep B S Ab 05/30/2014 INDETER* NEGATIVE Final   Unable to determine if anti-HBs is present at levels consistent withimmunity.  Patient's immune status should be further assessed byconsidering other clinical information.Result repeated and verified.  . Hep B Core Total Ab 05/30/2014 NON REACTIVE  NON REACTIVE Final  . HCV Ab 05/30/2014 NEGATIVE  NEGATIVE Final   Comment:  Effective June 18, 2014, Hepatitis C Antibody (test code 23620)will be revised to automatically reflex to the Hepatitis C Viral RNA,Quantitative, Real-Time PCR assay if the antibody screening result isReactive. This action is being taken to ensure                           that the CDC/USPSTFrecommended HCV diagnostic algorithm with the appropriate test reflexneeded for accurate interpretation is followed. As of June 18, 2014 the name of the test code 23620 will beHepatitis C Antibody with Reflex to HCV RNA,                           Quantitative, Real-TimePCR. This change will also be reflected in standard and customprofiles that currently include test code 23620.   Marland Kitchen WBC 05/30/2014 3.5* 3.9 - 10.3 10e3/uL Final  . NEUT# 05/30/2014 1.8  1.5 - 6.5 10e3/uL Final  . HGB 05/30/2014 10.3* 11.6 - 15.9 g/dL Final  . HCT 05/30/2014 32.1* 34.8 - 46.6 % Final  . Platelets 05/30/2014 201  145 - 400 10e3/uL Final  . MCV 05/30/2014 85.5  79.5 - 101.0 fL Final  . MCH 05/30/2014 27.3  25.1 - 34.0 pg Final  . MCHC 05/30/2014 32.0  31.5 - 36.0 g/dL Final  . RBC 05/30/2014 3.76  3.70 - 5.45 10e6/uL Final  . RDW 05/30/2014 16.1* 11.2 - 14.5 % Final  . lymph# 05/30/2014 1.5  0.9 - 3.3 10e3/uL Final  . MONO#  05/30/2014 0.2  0.1 - 0.9 10e3/uL Final  . Eosinophils Absolute 05/30/2014 0.1  0.0 - 0.5 10e3/uL Final  . Basophils Absolute 05/30/2014 0.0  0.0 - 0.1 10e3/uL Final  . NEUT% 05/30/2014 50.7  38.4 - 76.8 % Final  . LYMPH% 05/30/2014 41.7  14.0 - 49.7 % Final  . MONO% 05/30/2014 5.7  0.0 - 14.0 % Final  . EOS% 05/30/2014 1.7  0.0 - 7.0 % Final  . BASO% 05/30/2014 0.2  0.0 - 2.0 % Final  . Sodium 05/30/2014 140  136 - 145 mEq/L Final  . Potassium 05/30/2014 4.0  3.5 - 5.1 mEq/L Final  . Chloride 05/30/2014 109  98 - 109 mEq/L Final  . CO2 05/30/2014 24  22 - 29 mEq/L Final  . Glucose 05/30/2014 118  70 - 140 mg/dl Final  . BUN 05/30/2014 12.8  7.0 - 26.0 mg/dL Final  . Creatinine 05/30/2014 0.8  0.6 - 1.1 mg/dL Final  . Calcium 05/30/2014 9.0  8.4 - 10.4 mg/dL Final  . Anion Gap 05/30/2014 7  3 - 11 mEq/L Final     RADIOGRAPHIC STUDIES: No results found.  ASSESSMENT/PLAN:    Waldenstrom's macroglobulinemia  Assessment & Plan Patient initiated cycle 1, day 1 of her Rituxan/been lasting chemotherapy regimen.  She did  experience a hypersensitivity reaction to the Rituxan; which was managed per the hypersensitivity protocol.  Patient was able to complete her chemotherapy today; and has plans to return tomorrow for cycle 1, day 2 of the same regimen.  Patient will return on 06/13/2014 for labs and a followup visit as well.   Hypersensitivity reaction  Assessment & Plan Patient presented today for cycle 1, day 1 of her Rituxan/been lasting chemotherapy regimen.  She experienced a hypersensitivity reaction to the Rituxan infusion; which consisted of rigors and nausea/vomiting.  Patient received Solu-Medrol 125 mg IV, Benadryl 25 mg IV, Pepcid 20 mg IV, and Zofran 8 mg IV.  Patient also received Demerol 25 mg for rigors.  All hypersensitivity reaction symptoms resolved; and patient was able to complete her chemotherapy today.   Patient stated understanding of all instructions; and was in  agreement with this plan of care. The patient knows to call the clinic with any problems, questions or concerns.   Review/collaboration with Dr. Benay Spice regarding all aspects of patient's visit today.   Total time spent with patient was 40 minutes;  with greater than 80 percent of that time spent in face to face counseling regarding her symptoms, frequent monitoring of patient while in the infusion area, and coordination of care and follow up.  Disclaimer: This note was dictated with voice recognition software. Similar sounding words can inadvertently be transcribed and may not be corrected upon review.   Drue Second, NP 05/31/2014

## 2014-05-31 NOTE — Progress Notes (Signed)
1035 Patient complains of chills, shortness of breath, nausea and presented with rigors. Rituxan stopped, vital signs taken, Selena Lesser NP notified and emergency medication given. Patient received 1L normal saline, 25 mg benadryl, 20 mg pepcid, 125 mg solumedrol, 8 mg zofran, and 25 mg demerol given. Rituxan restarted 1135.

## 2014-05-31 NOTE — Assessment & Plan Note (Signed)
Patient presented today for cycle 1, day 1 of her Rituxan/been lasting chemotherapy regimen.  She experienced a hypersensitivity reaction to the Rituxan infusion; which consisted of rigors and nausea/vomiting.  Patient received Solu-Medrol 125 mg IV, Benadryl 25 mg IV, Pepcid 20 mg IV, and Zofran 8 mg IV.  Patient also received Demerol 25 mg for rigors.  All hypersensitivity reaction symptoms resolved; and patient was able to complete her chemotherapy today.

## 2014-06-01 ENCOUNTER — Ambulatory Visit (HOSPITAL_BASED_OUTPATIENT_CLINIC_OR_DEPARTMENT_OTHER): Payer: Medicare Other

## 2014-06-01 VITALS — BP 137/62 | HR 73 | Temp 97.6°F | Resp 19

## 2014-06-01 DIAGNOSIS — C880 Waldenstrom macroglobulinemia: Secondary | ICD-10-CM | POA: Diagnosis not present

## 2014-06-01 DIAGNOSIS — Z5111 Encounter for antineoplastic chemotherapy: Secondary | ICD-10-CM

## 2014-06-01 MED ORDER — ACETAMINOPHEN 325 MG PO TABS
ORAL_TABLET | ORAL | Status: AC
  Filled 2014-06-01: qty 2

## 2014-06-01 MED ORDER — SODIUM CHLORIDE 0.9 % IV SOLN
Freq: Once | INTRAVENOUS | Status: AC
  Administered 2014-06-01: 10:00:00 via INTRAVENOUS

## 2014-06-01 MED ORDER — SODIUM CHLORIDE 0.9 % IV SOLN
70.0000 mg/m2 | Freq: Once | INTRAVENOUS | Status: AC
  Administered 2014-06-01: 162 mg via INTRAVENOUS
  Filled 2014-06-01: qty 1.8

## 2014-06-01 MED ORDER — DEXAMETHASONE SODIUM PHOSPHATE 10 MG/ML IJ SOLN
10.0000 mg | Freq: Once | INTRAMUSCULAR | Status: AC
  Administered 2014-06-01: 10 mg via INTRAVENOUS

## 2014-06-01 MED ORDER — DEXAMETHASONE SODIUM PHOSPHATE 10 MG/ML IJ SOLN
INTRAMUSCULAR | Status: AC
  Filled 2014-06-01: qty 1

## 2014-06-01 MED ORDER — ONDANSETRON 8 MG/NS 50 ML IVPB
INTRAVENOUS | Status: AC
  Filled 2014-06-01: qty 8

## 2014-06-01 MED ORDER — ONDANSETRON 8 MG/50ML IVPB (CHCC)
8.0000 mg | Freq: Once | INTRAVENOUS | Status: AC
  Administered 2014-06-01: 8 mg via INTRAVENOUS

## 2014-06-01 MED ORDER — ACETAMINOPHEN 325 MG PO TABS
650.0000 mg | ORAL_TABLET | Freq: Once | ORAL | Status: AC
  Administered 2014-06-01: 650 mg via ORAL

## 2014-06-01 NOTE — Patient Instructions (Signed)
[facility] Discharge Instructions for Patients Receiving Chemotherapy  Today you received the following chemotherapy agents: Treanda.  To help prevent nausea and vomiting after your treatment, we encourage you to take your nausea medication as prescribed.   If you develop nausea and vomiting that is not controlled by your nausea medication, call the clinic.   BELOW ARE SYMPTOMS THAT SHOULD BE REPORTED IMMEDIATELY:  *FEVER GREATER THAN 100.5 F  *CHILLS WITH OR WITHOUT FEVER  NAUSEA AND VOMITING THAT IS NOT CONTROLLED WITH YOUR NAUSEA MEDICATION  *UNUSUAL SHORTNESS OF BREATH  *UNUSUAL BRUISING OR BLEEDING  TENDERNESS IN MOUTH AND THROAT WITH OR WITHOUT PRESENCE OF ULCERS  *URINARY PROBLEMS  *BOWEL PROBLEMS  UNUSUAL RASH Items with * indicate a potential emergency and should be followed up as soon as possible.  Feel free to call the clinic you have any questions or concerns. The clinic phone number is [phone].    

## 2014-06-01 NOTE — Progress Notes (Signed)
Returned for day #2 treatment today without adverse event.

## 2014-06-04 ENCOUNTER — Ambulatory Visit: Payer: Medicare Other | Admitting: Oncology

## 2014-06-04 ENCOUNTER — Telehealth: Payer: Self-pay | Admitting: *Deleted

## 2014-06-04 NOTE — Telephone Encounter (Signed)
PT. HAS BEEN VERY FATIGUED. Sunday SHE HAD A TEMPERATURE OF 99.5, WHEEZING, AND A PRODUCTIVE COUGH WITH YELLOW SPUTUM. PT. HAD A ZPAC AT HOME. SHE TOOK TWO TABLETS ON Sunday AND ON TABLET TODAY. PT. IS FEELING BETTER. SHE IS FORCING FLUIDS AND EATING SMALL MEALS. SLIGHT NAUSEA BUT NO VOMITING. NO DIARRHEA. PT. HAD A GOOD BOWEL MOVEMENT LAST NIGHT. NO MOUTH ISSUES. SHE WILL CALL THIS OFFICE IF ANY PROBLEMS OR CONCERNS ARISE.

## 2014-06-12 ENCOUNTER — Other Ambulatory Visit: Payer: Self-pay | Admitting: Nurse Practitioner

## 2014-06-12 DIAGNOSIS — C880 Waldenstrom macroglobulinemia: Secondary | ICD-10-CM

## 2014-06-13 ENCOUNTER — Other Ambulatory Visit (HOSPITAL_BASED_OUTPATIENT_CLINIC_OR_DEPARTMENT_OTHER): Payer: Medicare Other

## 2014-06-13 ENCOUNTER — Telehealth: Payer: Self-pay | Admitting: Oncology

## 2014-06-13 ENCOUNTER — Ambulatory Visit (HOSPITAL_BASED_OUTPATIENT_CLINIC_OR_DEPARTMENT_OTHER): Payer: Medicare Other | Admitting: Nurse Practitioner

## 2014-06-13 VITALS — BP 117/91 | HR 76 | Temp 98.0°F | Resp 18 | Ht 66.0 in | Wt 253.0 lb

## 2014-06-13 DIAGNOSIS — C8800 Waldenstrom macroglobulinemia not having achieved remission: Secondary | ICD-10-CM

## 2014-06-13 DIAGNOSIS — C8300 Small cell B-cell lymphoma, unspecified site: Secondary | ICD-10-CM | POA: Diagnosis not present

## 2014-06-13 DIAGNOSIS — D649 Anemia, unspecified: Secondary | ICD-10-CM | POA: Diagnosis not present

## 2014-06-13 LAB — CBC WITH DIFFERENTIAL/PLATELET
BASO%: 1 % (ref 0.0–2.0)
Basophils Absolute: 0 10*3/uL (ref 0.0–0.1)
EOS%: 3.3 % (ref 0.0–7.0)
Eosinophils Absolute: 0 10*3/uL (ref 0.0–0.5)
HCT: 29.6 % — ABNORMAL LOW (ref 34.8–46.6)
HGB: 9.8 g/dL — ABNORMAL LOW (ref 11.6–15.9)
LYMPH%: 24 % (ref 14.0–49.7)
MCH: 29.2 pg (ref 25.1–34.0)
MCHC: 33 g/dL (ref 31.5–36.0)
MCV: 88.3 fL (ref 79.5–101.0)
MONO#: 0.3 10*3/uL (ref 0.1–0.9)
MONO%: 19.5 % — ABNORMAL HIGH (ref 0.0–14.0)
NEUT#: 0.7 10*3/uL — ABNORMAL LOW (ref 1.5–6.5)
NEUT%: 52.2 % (ref 38.4–76.8)
Platelets: 327 10*3/uL (ref 145–400)
RBC: 3.35 10*6/uL — ABNORMAL LOW (ref 3.70–5.45)
RDW: 16.2 % — ABNORMAL HIGH (ref 11.2–14.5)
WBC: 1.4 10*3/uL — ABNORMAL LOW (ref 3.9–10.3)
lymph#: 0.3 10*3/uL — ABNORMAL LOW (ref 0.9–3.3)

## 2014-06-13 MED ORDER — AZITHROMYCIN 250 MG PO TABS: ORAL_TABLET | ORAL | Status: DC

## 2014-06-13 MED ORDER — ALBUTEROL SULFATE HFA 108 (90 BASE) MCG/ACT IN AERS: 1.0000 | INHALATION_SPRAY | Freq: Four times a day (QID) | RESPIRATORY_TRACT | Status: DC | PRN

## 2014-06-13 NOTE — Progress Notes (Addendum)
  [facility] OFFICE PROGRESS NOTE   Diagnosis:  Waldenstrm's macroglobulinemia  INTERVAL HISTORY:   Ms. Hamme returns as scheduled. She completed cycle 1 bendamustine/Rituxan beginning 05/31/2014. She became excessively fatigued following treatment. The fatigue improved over about one week. She was experiencing cold symptoms at that time as well including a cough and congestion. She had a low-grade fever. She completed a Z-Pak. Appetite was poor. All symptoms are better. She reports having a reaction to the Rituxan with hard shaking chills. She was ultimately able to complete the infusion.  Objective:  Vital signs in last 24 hours:  Blood pressure 117/91, pulse 76, temperature 98 F (36.7 C), temperature source Oral, resp. rate 18, height 5\' 6"  (1.676 m), weight 253 lb (114.76 kg), SpO2 100 %.    HEENT: no thrush or ulcers. Resp: lungs clear bilaterally. Cardio: regular rate and rhythm. GI: abdomen soft and nontender. No organomegaly. Vascular: no leg edema.   Lab Results:  Lab Results  Component Value Date   WBC 1.4* 06/13/2014   HGB 9.8* 06/13/2014   HCT 29.6* 06/13/2014   MCV 88.3 06/13/2014   PLT 327 06/13/2014   NEUTROABS 0.7* 06/13/2014    Imaging:  No results found.  Medications: I have reviewed the patient's current medications.  Assessment/Plan: 1. 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 Treated with multiple systemic therapies includingpentostatin/Cytoxan/rituximab in 2010.   Initiation of bendamustine/Rituxan 05/31/2014. 2. Left knee arthritis. Followed by Dr. Wynelle Link. 3. Sleep apnea. 4. Anemia. Chronic. 5. Question left parotitis 12/12/2013 presenting with left facial and parotid edema.   Disposition: Ms. Goh appears stable. She has completed one cycle  of bendamustine/Rituxan. Aside from increased fatigue she tolerated the first cycle well. She will return for labs on 06/26/2014 to include a CBC and [name]. We will see her in followup on 07/02/2014 prior to cycle 2 bendamustine/Rituxan.   She understands her white count is low. She will call with fever, chills, other signs of infection.  Patient seen with Dr. Benay Spice. 25 minutes were spent face-to-face at today's visit with the majority of the time involved in counseling/coordination of care.    Ned Card ANP/GNP-BC   06/13/2014  10:22 AM  This was a shared visit with Ned Card. She has completed 1 cycle of bendamustine/rituximab. She has developed progressive anemia/neutropenia, likely secondary to chemotherapy.  She had a hypersensitivity reaction with rituximab and was able to complete the rituximab infusion after further medication.  We will check the [name] level prior to the next cycle of bendamustine/rituximab.  Julieanne Manson, M.D.

## 2014-06-13 NOTE — Telephone Encounter (Signed)
gv and printed appt scheda nd avs fo rpt for NOV and DEC....emailed MW to add 11.30

## 2014-06-14 ENCOUNTER — Telehealth: Payer: Self-pay | Admitting: *Deleted

## 2014-06-14 NOTE — Telephone Encounter (Signed)
Per staff message and POF I have scheduled appts. Advised scheduler of appts. JMW  

## 2014-06-21 DIAGNOSIS — K130 Diseases of lips: Secondary | ICD-10-CM | POA: Diagnosis not present

## 2014-06-21 DIAGNOSIS — I10 Essential (primary) hypertension: Secondary | ICD-10-CM | POA: Diagnosis not present

## 2014-06-21 DIAGNOSIS — C8590 Non-Hodgkin lymphoma, unspecified, unspecified site: Secondary | ICD-10-CM | POA: Diagnosis not present

## 2014-06-26 ENCOUNTER — Other Ambulatory Visit (HOSPITAL_BASED_OUTPATIENT_CLINIC_OR_DEPARTMENT_OTHER): Payer: Medicare Other

## 2014-06-26 ENCOUNTER — Ambulatory Visit: Payer: Medicare Other | Admitting: Oncology

## 2014-06-26 DIAGNOSIS — D649 Anemia, unspecified: Secondary | ICD-10-CM | POA: Diagnosis not present

## 2014-06-26 DIAGNOSIS — C880 Waldenstrom macroglobulinemia: Secondary | ICD-10-CM | POA: Diagnosis not present

## 2014-06-26 DIAGNOSIS — C8300 Small cell B-cell lymphoma, unspecified site: Secondary | ICD-10-CM

## 2014-06-26 LAB — CBC WITH DIFFERENTIAL/PLATELET
BASO%: 0.8 % (ref 0.0–2.0)
Basophils Absolute: 0 10*3/uL (ref 0.0–0.1)
EOS%: 2.7 % (ref 0.0–7.0)
Eosinophils Absolute: 0.1 10*3/uL (ref 0.0–0.5)
HCT: 29.9 % — ABNORMAL LOW (ref 34.8–46.6)
HGB: 9.6 g/dL — ABNORMAL LOW (ref 11.6–15.9)
LYMPH%: 12.1 % — ABNORMAL LOW (ref 14.0–49.7)
MCH: 27.2 pg (ref 25.1–34.0)
MCHC: 32.2 g/dL (ref 31.5–36.0)
MCV: 84.7 fL (ref 79.5–101.0)
MONO#: 0.4 10*3/uL (ref 0.1–0.9)
MONO%: 8.9 % (ref 0.0–14.0)
NEUT#: 3.6 10*3/uL (ref 1.5–6.5)
NEUT%: 75.5 % (ref 38.4–76.8)
Platelets: 209 10*3/uL (ref 145–400)
RBC: 3.53 10*6/uL — ABNORMAL LOW (ref 3.70–5.45)
RDW: 18 % — ABNORMAL HIGH (ref 11.2–14.5)
WBC: 4.8 10*3/uL (ref 3.9–10.3)
lymph#: 0.6 10*3/uL — ABNORMAL LOW (ref 0.9–3.3)

## 2014-06-27 LAB — IGM: IgM, Serum: 3430 mg/dL — ABNORMAL HIGH (ref 52–322)

## 2014-06-29 ENCOUNTER — Other Ambulatory Visit: Payer: Self-pay | Admitting: Oncology

## 2014-06-29 ENCOUNTER — Telehealth: Payer: Self-pay | Admitting: *Deleted

## 2014-06-29 NOTE — Telephone Encounter (Signed)
-----   Message from Ladell Pier, MD sent at 06/29/2014  1:40 PM EST ----- Please call patient, [name] is better

## 2014-06-29 NOTE — Telephone Encounter (Signed)
Notified of decrease in [name].

## 2014-07-02 ENCOUNTER — Ambulatory Visit (HOSPITAL_BASED_OUTPATIENT_CLINIC_OR_DEPARTMENT_OTHER): Payer: Medicare Other

## 2014-07-02 ENCOUNTER — Telehealth: Payer: Self-pay | Admitting: *Deleted

## 2014-07-02 ENCOUNTER — Other Ambulatory Visit (HOSPITAL_BASED_OUTPATIENT_CLINIC_OR_DEPARTMENT_OTHER): Payer: Medicare Other

## 2014-07-02 ENCOUNTER — Telehealth: Payer: Self-pay | Admitting: Oncology

## 2014-07-02 ENCOUNTER — Ambulatory Visit (HOSPITAL_BASED_OUTPATIENT_CLINIC_OR_DEPARTMENT_OTHER): Payer: Medicare Other | Admitting: Oncology

## 2014-07-02 VITALS — BP 134/47 | HR 86 | Temp 98.4°F | Resp 20 | Ht 66.0 in | Wt 259.6 lb

## 2014-07-02 DIAGNOSIS — D649 Anemia, unspecified: Secondary | ICD-10-CM | POA: Diagnosis not present

## 2014-07-02 DIAGNOSIS — C8800 Waldenstrom macroglobulinemia not having achieved remission: Secondary | ICD-10-CM

## 2014-07-02 DIAGNOSIS — Z5111 Encounter for antineoplastic chemotherapy: Secondary | ICD-10-CM | POA: Diagnosis not present

## 2014-07-02 DIAGNOSIS — Z5112 Encounter for antineoplastic immunotherapy: Secondary | ICD-10-CM

## 2014-07-02 DIAGNOSIS — C8300 Small cell B-cell lymphoma, unspecified site: Secondary | ICD-10-CM | POA: Diagnosis not present

## 2014-07-02 DIAGNOSIS — G4730 Sleep apnea, unspecified: Secondary | ICD-10-CM

## 2014-07-02 LAB — COMPREHENSIVE METABOLIC PANEL (CC13)
ALT: 13 U/L (ref 0–55)
AST: 14 U/L (ref 5–34)
Albumin: 3.1 g/dL — ABNORMAL LOW (ref 3.5–5.0)
Alkaline Phosphatase: 73 U/L (ref 40–150)
Anion Gap: 9 mEq/L (ref 3–11)
BUN: 12 mg/dL (ref 7.0–26.0)
CO2: 23 mEq/L (ref 22–29)
Calcium: 9.3 mg/dL (ref 8.4–10.4)
Chloride: 110 mEq/L — ABNORMAL HIGH (ref 98–109)
Creatinine: 0.8 mg/dL (ref 0.6–1.1)
Glucose: 127 mg/dl (ref 70–140)
Potassium: 4 mEq/L (ref 3.5–5.1)
Sodium: 143 mEq/L (ref 136–145)
Total Bilirubin: 0.78 mg/dL (ref 0.20–1.20)
Total Protein: 8.2 g/dL (ref 6.4–8.3)

## 2014-07-02 LAB — CBC WITH DIFFERENTIAL/PLATELET
BASO%: 0.7 % (ref 0.0–2.0)
Basophils Absolute: 0 10*3/uL (ref 0.0–0.1)
EOS%: 5.8 % (ref 0.0–7.0)
Eosinophils Absolute: 0.2 10*3/uL (ref 0.0–0.5)
HCT: 28.4 % — ABNORMAL LOW (ref 34.8–46.6)
HGB: 9.7 g/dL — ABNORMAL LOW (ref 11.6–15.9)
LYMPH%: 16 % (ref 14.0–49.7)
MCH: 31.5 pg (ref 25.1–34.0)
MCHC: 34.3 g/dL (ref 31.5–36.0)
MCV: 91.7 fL (ref 79.5–101.0)
MONO#: 0.2 10*3/uL (ref 0.1–0.9)
MONO%: 8.1 % (ref 0.0–14.0)
NEUT#: 2.1 10*3/uL (ref 1.5–6.5)
NEUT%: 69.4 % (ref 38.4–76.8)
Platelets: 205 10*3/uL (ref 145–400)
RBC: 3.09 10*6/uL — ABNORMAL LOW (ref 3.70–5.45)
RDW: 17.7 % — ABNORMAL HIGH (ref 11.2–14.5)
WBC: 3 10*3/uL — ABNORMAL LOW (ref 3.9–10.3)
lymph#: 0.5 10*3/uL — ABNORMAL LOW (ref 0.9–3.3)

## 2014-07-02 MED ORDER — ONDANSETRON 8 MG/NS 50 ML IVPB
INTRAVENOUS | Status: AC
  Filled 2014-07-02: qty 8

## 2014-07-02 MED ORDER — ONDANSETRON 8 MG/50ML IVPB (CHCC)
8.0000 mg | Freq: Once | INTRAVENOUS | Status: AC
  Administered 2014-07-02: 8 mg via INTRAVENOUS

## 2014-07-02 MED ORDER — ACETAMINOPHEN 325 MG PO TABS
ORAL_TABLET | ORAL | Status: AC
  Filled 2014-07-02: qty 2

## 2014-07-02 MED ORDER — DIPHENHYDRAMINE HCL 25 MG PO CAPS
50.0000 mg | ORAL_CAPSULE | Freq: Once | ORAL | Status: AC
  Administered 2014-07-02: 50 mg via ORAL

## 2014-07-02 MED ORDER — ACETAMINOPHEN 325 MG PO TABS
650.0000 mg | ORAL_TABLET | Freq: Once | ORAL | Status: AC
  Administered 2014-07-02: 650 mg via ORAL

## 2014-07-02 MED ORDER — FAMOTIDINE IN NACL 20-0.9 MG/50ML-% IV SOLN
20.0000 mg | Freq: Two times a day (BID) | INTRAVENOUS | Status: DC
  Administered 2014-07-02: 20 mg via INTRAVENOUS

## 2014-07-02 MED ORDER — SODIUM CHLORIDE 0.9 % IV SOLN
Freq: Once | INTRAVENOUS | Status: AC
  Administered 2014-07-02: 10:00:00 via INTRAVENOUS

## 2014-07-02 MED ORDER — DEXAMETHASONE SODIUM PHOSPHATE 10 MG/ML IJ SOLN
10.0000 mg | Freq: Once | INTRAMUSCULAR | Status: AC
  Administered 2014-07-02: 10 mg via INTRAVENOUS

## 2014-07-02 MED ORDER — FAMOTIDINE IN NACL 20-0.9 MG/50ML-% IV SOLN
INTRAVENOUS | Status: AC
  Filled 2014-07-02: qty 50

## 2014-07-02 MED ORDER — DEXAMETHASONE SODIUM PHOSPHATE 10 MG/ML IJ SOLN
INTRAMUSCULAR | Status: AC
  Filled 2014-07-02: qty 1

## 2014-07-02 MED ORDER — DIPHENHYDRAMINE HCL 25 MG PO CAPS
ORAL_CAPSULE | ORAL | Status: AC
  Filled 2014-07-02: qty 2

## 2014-07-02 MED ORDER — SODIUM CHLORIDE 0.9 % IV SOLN
70.0000 mg/m2 | Freq: Once | INTRAVENOUS | Status: AC
  Administered 2014-07-02: 162 mg via INTRAVENOUS
  Filled 2014-07-02: qty 1.8

## 2014-07-02 MED ORDER — SODIUM CHLORIDE 0.9 % IV SOLN
375.0000 mg/m2 | Freq: Once | INTRAVENOUS | Status: AC
  Administered 2014-07-02: 900 mg via INTRAVENOUS
  Filled 2014-07-02: qty 90

## 2014-07-02 NOTE — Telephone Encounter (Signed)
Per staff message and POF I have scheduled appts. Advised scheduler of appts. JMW  

## 2014-07-02 NOTE — Telephone Encounter (Signed)
Gave avs & cal forDec. Sent mess to sch tx. °

## 2014-07-02 NOTE — Progress Notes (Signed)
  [facility] OFFICE PROGRESS NOTE   Diagnosis: Waldenstrm's macroglobulinemia  INTERVAL HISTORY:   She returns as scheduled. She completed a first cycle of bendamustine/rituximab on 05/31/2014. She had a hypersensitivity reaction to Rituxan that responded to medical therapy. She was able to complete the rituximab infusion. She reports a significant improvement in her energy level since completing treatment. No fever.  Objective:  Vital signs in last 24 hours:  Blood pressure 134/47, pulse 86, temperature 98.4 F (36.9 C), temperature source Oral, resp. rate 20, height 5\' 6"  (1.676 m), weight 259 lb 9.6 oz (117.754 kg), SpO2 97 %.    HEENT: No thrush or ulcers Resp: Scattered inspiratory/expiratory rhonchi, no respiratory distress Cardio: Regular rate and rhythm GI: No hepatosplenomegaly Vascular: Trace low pretibial edema bilaterally   Lab Results:  Lab Results  Component Value Date   WBC 3.0* 07/02/2014   HGB 9.7* 07/02/2014   HCT 28.4* 07/02/2014   MCV 91.7 07/02/2014   PLT 205 07/02/2014   NEUTROABS 2.1 07/02/2014   06/26/2014-[name] 3430    Medications: I have reviewed the patient's current medications.  Assessment/Plan: 1. 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 Treated with multiple systemic therapies includingpentostatin/Cytoxan/rituximab in 2010.   Cycle 1 bendamustine/Rituxan 05/31/2014. 2. Left knee arthritis. Followed by Dr. Wynelle Link. 3. Sleep apnea. 4. Anemia. Chronic. 5. Question left parotitis 12/12/2013 presenting with left facial and parotid edema.  Disposition:  She has completed one cycle of bendamustine/rituximab. She had an infusion reaction to the rituximab with cycle 1. We will add Pepcid to the premedication regimen today. The plan is to proceed  with cycle 2 bendamustine/rituximab today. Ms. Dejarnett has an improved performance status and the [name] level is lower after cycle 1.  She will return for a lab and office visit 07/24/2014. She will be scheduled for a third cycle of bendamustine/rituximab on 07/30/2014.  [name], MD  07/02/2014  9:07 AM

## 2014-07-02 NOTE — Patient Instructions (Signed)
[facility] Discharge Instructions for Patients Receiving Chemotherapy  Today you received the following chemotherapy agents Rituxan and Treanda. To help prevent nausea and vomiting after your treatment, we encourage you to take your nausea medication as prescribed.   If you develop nausea and vomiting that is not controlled by your nausea medication, call the clinic.   BELOW ARE SYMPTOMS THAT SHOULD BE REPORTED IMMEDIATELY:  *FEVER GREATER THAN 100.5 F  *CHILLS WITH OR WITHOUT FEVER  NAUSEA AND VOMITING THAT IS NOT CONTROLLED WITH YOUR NAUSEA MEDICATION  *UNUSUAL SHORTNESS OF BREATH  *UNUSUAL BRUISING OR BLEEDING  TENDERNESS IN MOUTH AND THROAT WITH OR WITHOUT PRESENCE OF ULCERS  *URINARY PROBLEMS  *BOWEL PROBLEMS  UNUSUAL RASH Items with * indicate a potential emergency and should be followed up as soon as possible.  Feel free to call the clinic you have any questions or concerns. The clinic phone number is [phone].    

## 2014-07-02 NOTE — Progress Notes (Signed)
Patient receiving Rituxan, at max rate. Pt verbalizes headache 4/10 and has been premedicated at 10 am with tylenol and benadryl. Ned Card, NP notified, OK to repeat 650 mg tylenol per NP. Verbal order obtained; medication given.

## 2014-07-03 ENCOUNTER — Ambulatory Visit (HOSPITAL_BASED_OUTPATIENT_CLINIC_OR_DEPARTMENT_OTHER): Payer: Medicare Other

## 2014-07-03 DIAGNOSIS — C880 Waldenstrom macroglobulinemia: Secondary | ICD-10-CM

## 2014-07-03 DIAGNOSIS — Z5111 Encounter for antineoplastic chemotherapy: Secondary | ICD-10-CM | POA: Diagnosis not present

## 2014-07-03 DIAGNOSIS — C8300 Small cell B-cell lymphoma, unspecified site: Secondary | ICD-10-CM | POA: Diagnosis not present

## 2014-07-03 MED ORDER — DEXAMETHASONE SODIUM PHOSPHATE 10 MG/ML IJ SOLN
10.0000 mg | Freq: Once | INTRAMUSCULAR | Status: AC
  Administered 2014-07-03: 10 mg via INTRAVENOUS

## 2014-07-03 MED ORDER — IBUPROFEN 200 MG PO TABS
400.0000 mg | ORAL_TABLET | Freq: Once | ORAL | Status: AC
  Administered 2014-07-03: 400 mg via ORAL

## 2014-07-03 MED ORDER — SODIUM CHLORIDE 0.9 % IV SOLN
Freq: Once | INTRAVENOUS | Status: AC
  Administered 2014-07-03: 09:00:00 via INTRAVENOUS

## 2014-07-03 MED ORDER — ONDANSETRON 8 MG/NS 50 ML IVPB
INTRAVENOUS | Status: AC
  Filled 2014-07-03: qty 8

## 2014-07-03 MED ORDER — IBUPROFEN 200 MG PO TABS
ORAL_TABLET | ORAL | Status: AC
  Filled 2014-07-03: qty 2

## 2014-07-03 MED ORDER — SODIUM CHLORIDE 0.9 % IV SOLN
70.0000 mg/m2 | Freq: Once | INTRAVENOUS | Status: AC
  Administered 2014-07-03: 162 mg via INTRAVENOUS
  Filled 2014-07-03: qty 1.8

## 2014-07-03 MED ORDER — DEXAMETHASONE SODIUM PHOSPHATE 10 MG/ML IJ SOLN
INTRAMUSCULAR | Status: AC
  Filled 2014-07-03: qty 1

## 2014-07-03 MED ORDER — ONDANSETRON 8 MG/50ML IVPB (CHCC)
8.0000 mg | Freq: Once | INTRAVENOUS | Status: AC
  Administered 2014-07-03: 8 mg via INTRAVENOUS

## 2014-07-03 NOTE — Progress Notes (Signed)
Pt in for day 2 of treatment today c/o mild headache.  Pt stated that she had a headache that worsened over the day with day 2 of last treatment as well.  Dr. Benay Spice aware.  Orders given for ibuprofen 400 mg PO.  Pt given ibuprofen per ordrers

## 2014-07-03 NOTE — Patient Instructions (Signed)
[facility] Discharge Instructions for Patients Receiving Chemotherapy  Today you received the following chemotherapy agents Trenda.  To help prevent nausea and vomiting after your treatment, we encourage you to take your nausea medication as directed/prescribed.   If you develop nausea and vomiting that is not controlled by your nausea medication, call the clinic.   BELOW ARE SYMPTOMS THAT SHOULD BE REPORTED IMMEDIATELY:  *FEVER GREATER THAN 100.5 F  *CHILLS WITH OR WITHOUT FEVER  NAUSEA AND VOMITING THAT IS NOT CONTROLLED WITH YOUR NAUSEA MEDICATION  *UNUSUAL SHORTNESS OF BREATH  *UNUSUAL BRUISING OR BLEEDING  TENDERNESS IN MOUTH AND THROAT WITH OR WITHOUT PRESENCE OF ULCERS  *URINARY PROBLEMS  *BOWEL PROBLEMS  UNUSUAL RASH Items with * indicate a potential emergency and should be followed up as soon as possible.  Feel free to call the clinic you have any questions or concerns. The clinic phone number is [phone].

## 2014-07-09 DIAGNOSIS — G4733 Obstructive sleep apnea (adult) (pediatric): Secondary | ICD-10-CM | POA: Diagnosis not present

## 2014-07-24 ENCOUNTER — Ambulatory Visit (HOSPITAL_BASED_OUTPATIENT_CLINIC_OR_DEPARTMENT_OTHER): Payer: Medicare Other | Admitting: Oncology

## 2014-07-24 ENCOUNTER — Telehealth: Payer: Self-pay | Admitting: Oncology

## 2014-07-24 ENCOUNTER — Ambulatory Visit (HOSPITAL_BASED_OUTPATIENT_CLINIC_OR_DEPARTMENT_OTHER): Payer: Medicare Other | Admitting: Lab

## 2014-07-24 VITALS — BP 131/83 | HR 78 | Temp 97.7°F | Resp 22 | Ht 66.0 in | Wt 257.8 lb

## 2014-07-24 DIAGNOSIS — D649 Anemia, unspecified: Secondary | ICD-10-CM

## 2014-07-24 DIAGNOSIS — C8510 Unspecified B-cell lymphoma, unspecified site: Secondary | ICD-10-CM | POA: Diagnosis not present

## 2014-07-24 DIAGNOSIS — D709 Neutropenia, unspecified: Secondary | ICD-10-CM

## 2014-07-24 DIAGNOSIS — C8800 Waldenstrom macroglobulinemia not having achieved remission: Secondary | ICD-10-CM

## 2014-07-24 LAB — COMPREHENSIVE METABOLIC PANEL (CC13)
ALT: 13 U/L (ref 0–55)
AST: 15 U/L (ref 5–34)
Albumin: 3 g/dL — ABNORMAL LOW (ref 3.5–5.0)
Alkaline Phosphatase: 78 U/L (ref 40–150)
Anion Gap: 7 mEq/L (ref 3–11)
BUN: 10.4 mg/dL (ref 7.0–26.0)
CO2: 27 mEq/L (ref 22–29)
Calcium: 9.3 mg/dL (ref 8.4–10.4)
Chloride: 107 mEq/L (ref 98–109)
Creatinine: 0.8 mg/dL (ref 0.6–1.1)
EGFR: 71 mL/min/{1.73_m2} — ABNORMAL LOW (ref >90)
Glucose: 106 mg/dl (ref 70–140)
Potassium: 3.9 mEq/L (ref 3.5–5.1)
Sodium: 142 mEq/L (ref 136–145)
Total Bilirubin: 0.71 mg/dL (ref 0.20–1.20)
Total Protein: 7.6 g/dL (ref 6.4–8.3)

## 2014-07-24 LAB — CBC WITH DIFFERENTIAL/PLATELET
BASO%: 0.9 % (ref 0.0–2.0)
Basophils Absolute: 0 10*3/uL (ref 0.0–0.1)
EOS%: 5.2 % (ref 0.0–7.0)
Eosinophils Absolute: 0.1 10*3/uL (ref 0.0–0.5)
HCT: 27.9 % — ABNORMAL LOW (ref 34.8–46.6)
HGB: 9.4 g/dL — ABNORMAL LOW (ref 11.6–15.9)
LYMPH%: 20.3 % (ref 14.0–49.7)
MCH: 30.5 pg (ref 25.1–34.0)
MCHC: 33.8 g/dL (ref 31.5–36.0)
MCV: 90.2 fL (ref 79.5–101.0)
MONO#: 0.2 10*3/uL (ref 0.1–0.9)
MONO%: 13 % (ref 0.0–14.0)
NEUT#: 1.1 10*3/uL — ABNORMAL LOW (ref 1.5–6.5)
NEUT%: 60.6 % (ref 38.4–76.8)
Platelets: 226 10*3/uL (ref 145–400)
RBC: 3.09 10*6/uL — ABNORMAL LOW (ref 3.70–5.45)
RDW: 18.6 % — ABNORMAL HIGH (ref 11.2–14.5)
WBC: 1.9 10*3/uL — ABNORMAL LOW (ref 3.9–10.3)
lymph#: 0.4 10*3/uL — ABNORMAL LOW (ref 0.9–3.3)

## 2014-07-24 NOTE — Progress Notes (Signed)
  [facility] OFFICE PROGRESS NOTE   Diagnosis: Waldenstrm's macroglobulinemia  INTERVAL HISTORY:   Natasha Ellis returns as scheduled. She completed a second cycle of bendamustine/rituximab beginning 07/02/2014. She reports increased fatigue following chemotherapy. She has chronic exertional dyspnea. She is no longer using Symbicort.  Objective:  Vital signs in last 24 hours:  Blood pressure 131/83, pulse 78, temperature 97.7 F (36.5 C), temperature source Oral, resp. rate 22, height 5\' 6"  (1.676 m), weight 257 lb 12.8 oz (116.937 kg), SpO2 96 %.    HEENT: No thrush or ulcers Resp: Scattered inspiratory rhonchi at the posterior basis with wheezing at the left posterior base, no respiratory distress at rest Cardio: Regular rate and rhythm GI: No hepatosplenomegaly Vascular: No leg edema   Lab Results:  Lab Results  Component Value Date   WBC 1.9* 07/24/2014   HGB 9.4* 07/24/2014   HCT 27.9* 07/24/2014   MCV 90.2 07/24/2014   PLT 226 07/24/2014   NEUTROABS 1.1* 07/24/2014     Medications: I have reviewed the patient's current medications.  Assessment/Plan: 1. 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 Treated with multiple systemic therapies includingpentostatin/Cytoxan/rituximab in 2010.   Cycle 1 bendamustine/Rituxan 05/31/2014.  Cycle 2 bendamustine/Rituxan 07/02/2014 2. Left knee arthritis. Followed by Dr. Wynelle Link. 3. Sleep apnea. 4. Anemia. Chronic. 5. Question left parotitis 12/12/2013 presenting with left facial and parotid edema.   Disposition:  She has completed 2 cycles of bendamustine/rituximab. The anemia has progressed and she has neutropenia today. The hematologic changes are likely related to chemotherapy. We will check a CBC when she returns for cycle 3  07/30/2014.  The exertional dyspnea is chronic and most likely related to COPD. I recommended she follow-up with Dr. Felipa Eth or pulmonary to discuss use of inhalers.  We will follow-up on the [name] level from today. If the [name] is lower the plan is to proceed with cycle 3 07/30/2014. She will be scheduled for an office visit after cycle 3.  [name], MD  07/24/2014  10:23 AM

## 2014-07-24 NOTE — Telephone Encounter (Signed)
Gave avs & cal for Jan. Sent mess to adjust tx. Also sent MD mess to verify if needs another appt.

## 2014-07-25 ENCOUNTER — Telehealth: Payer: Self-pay | Admitting: *Deleted

## 2014-07-25 ENCOUNTER — Telehealth: Payer: Self-pay | Admitting: Oncology

## 2014-07-25 NOTE — Telephone Encounter (Signed)
Confirm appt d.t for 08/14/14.

## 2014-07-25 NOTE — Telephone Encounter (Signed)
I have adjusted 1/25 appt

## 2014-07-26 LAB — PROTEIN ELECTROPHORESIS, SERUM
Albumin ELP: 46.8 % — ABNORMAL LOW (ref 55.8–66.1)
Alpha-1-Globulin: 4.6 % (ref 2.9–4.9)
Alpha-2-Globulin: 8.9 % (ref 7.1–11.8)
Beta 2: 3 % — ABNORMAL LOW (ref 3.2–6.5)
Beta Globulin: 4.7 % (ref 4.7–7.2)
Gamma Globulin: 32 % — ABNORMAL HIGH (ref 11.1–18.8)
M-Spike, %: 2.16 g/dL
Total Protein, Serum Electrophoresis: 7.4 g/dL (ref 6.0–8.3)

## 2014-07-26 LAB — IGM: IgM, Serum: 3270 mg/dL — ABNORMAL HIGH (ref 52–322)

## 2014-07-28 ENCOUNTER — Other Ambulatory Visit: Payer: Self-pay | Admitting: Oncology

## 2014-07-30 ENCOUNTER — Other Ambulatory Visit (HOSPITAL_BASED_OUTPATIENT_CLINIC_OR_DEPARTMENT_OTHER): Payer: Medicare Other

## 2014-07-30 ENCOUNTER — Ambulatory Visit (HOSPITAL_BASED_OUTPATIENT_CLINIC_OR_DEPARTMENT_OTHER): Payer: Medicare Other

## 2014-07-30 DIAGNOSIS — Z5111 Encounter for antineoplastic chemotherapy: Secondary | ICD-10-CM | POA: Diagnosis not present

## 2014-07-30 DIAGNOSIS — C8800 Waldenstrom macroglobulinemia not having achieved remission: Secondary | ICD-10-CM

## 2014-07-30 DIAGNOSIS — Z5112 Encounter for antineoplastic immunotherapy: Secondary | ICD-10-CM

## 2014-07-30 LAB — CBC WITH DIFFERENTIAL/PLATELET
BASO%: 1.2 % (ref 0.0–2.0)
Basophils Absolute: 0 10*3/uL (ref 0.0–0.1)
EOS%: 3 % (ref 0.0–7.0)
Eosinophils Absolute: 0.1 10*3/uL (ref 0.0–0.5)
HCT: UNDETERMINED % (ref 34.8–46.6)
HGB: 9.5 g/dL — ABNORMAL LOW (ref 11.6–15.9)
LYMPH%: 20.8 % (ref 14.0–49.7)
MCH: UNDETERMINED pg (ref 25.1–34.0)
MCHC: UNDETERMINED g/dL (ref 31.5–36.0)
MCV: UNDETERMINED fL (ref 79.5–101.0)
MONO#: 0.3 10*3/uL (ref 0.1–0.9)
MONO%: 9.7 % (ref 0.0–14.0)
NEUT#: 2.2 10*3/uL (ref 1.5–6.5)
NEUT%: 65.3 % (ref 38.4–76.8)
Platelets: 182 10*3/uL (ref 145–400)
RBC: UNDETERMINED 10*6/uL (ref 3.70–5.45)
RDW: UNDETERMINED % (ref 11.2–14.5)
WBC: 3.3 10*3/uL — ABNORMAL LOW (ref 3.9–10.3)
lymph#: 0.7 10*3/uL — ABNORMAL LOW (ref 0.9–3.3)
nRBC: 0 % (ref 0–0)

## 2014-07-30 MED ORDER — ACETAMINOPHEN 325 MG PO TABS
ORAL_TABLET | ORAL | Status: AC
  Filled 2014-07-30: qty 2

## 2014-07-30 MED ORDER — FAMOTIDINE IN NACL 20-0.9 MG/50ML-% IV SOLN
20.0000 mg | Freq: Two times a day (BID) | INTRAVENOUS | Status: DC
  Administered 2014-07-30: 20 mg via INTRAVENOUS

## 2014-07-30 MED ORDER — DIPHENHYDRAMINE HCL 25 MG PO CAPS
ORAL_CAPSULE | ORAL | Status: AC
  Filled 2014-07-30: qty 2

## 2014-07-30 MED ORDER — FAMOTIDINE IN NACL 20-0.9 MG/50ML-% IV SOLN
INTRAVENOUS | Status: AC
  Filled 2014-07-30: qty 50

## 2014-07-30 MED ORDER — DIPHENHYDRAMINE HCL 25 MG PO CAPS
50.0000 mg | ORAL_CAPSULE | Freq: Once | ORAL | Status: AC
  Administered 2014-07-30: 50 mg via ORAL

## 2014-07-30 MED ORDER — SODIUM CHLORIDE 0.9 % IV SOLN
Freq: Once | INTRAVENOUS | Status: AC
  Administered 2014-07-30: 10:00:00 via INTRAVENOUS

## 2014-07-30 MED ORDER — DEXAMETHASONE SODIUM PHOSPHATE 10 MG/ML IJ SOLN
10.0000 mg | Freq: Once | INTRAMUSCULAR | Status: AC
  Administered 2014-07-30: 10 mg via INTRAVENOUS

## 2014-07-30 MED ORDER — ACETAMINOPHEN 325 MG PO TABS
650.0000 mg | ORAL_TABLET | Freq: Once | ORAL | Status: AC
  Administered 2014-07-30: 650 mg via ORAL

## 2014-07-30 MED ORDER — SODIUM CHLORIDE 0.9 % IV SOLN
375.0000 mg/m2 | Freq: Once | INTRAVENOUS | Status: AC
  Administered 2014-07-30: 900 mg via INTRAVENOUS
  Filled 2014-07-30: qty 90

## 2014-07-30 MED ORDER — ONDANSETRON 8 MG/50ML IVPB (CHCC)
8.0000 mg | Freq: Once | INTRAVENOUS | Status: AC
  Administered 2014-07-30: 8 mg via INTRAVENOUS

## 2014-07-30 MED ORDER — BENDAMUSTINE HCL CHEMO INJECTION 180 MG/2ML
70.0000 mg/m2 | Freq: Once | INTRAVENOUS | Status: AC
  Administered 2014-07-30: 162 mg via INTRAVENOUS
  Filled 2014-07-30: qty 1.8

## 2014-07-30 MED ORDER — ONDANSETRON 8 MG/NS 50 ML IVPB
INTRAVENOUS | Status: AC
  Filled 2014-07-30: qty 8

## 2014-07-30 MED ORDER — DEXAMETHASONE SODIUM PHOSPHATE 10 MG/ML IJ SOLN
INTRAMUSCULAR | Status: AC
  Filled 2014-07-30: qty 1

## 2014-07-30 NOTE — Patient Instructions (Signed)
[facility] Discharge Instructions for Patients Receiving Chemotherapy  Today you received the following chemotherapy agents Rituxan and Treanda. To help prevent nausea and vomiting after your treatment, we encourage you to take your nausea medication as prescribed.   If you develop nausea and vomiting that is not controlled by your nausea medication, call the clinic.   BELOW ARE SYMPTOMS THAT SHOULD BE REPORTED IMMEDIATELY:  *FEVER GREATER THAN 100.5 F  *CHILLS WITH OR WITHOUT FEVER  NAUSEA AND VOMITING THAT IS NOT CONTROLLED WITH YOUR NAUSEA MEDICATION  *UNUSUAL SHORTNESS OF BREATH  *UNUSUAL BRUISING OR BLEEDING  TENDERNESS IN MOUTH AND THROAT WITH OR WITHOUT PRESENCE OF ULCERS  *URINARY PROBLEMS  *BOWEL PROBLEMS  UNUSUAL RASH Items with * indicate a potential emergency and should be followed up as soon as possible.  Feel free to call the clinic you have any questions or concerns. The clinic phone number is [phone].    

## 2014-07-31 ENCOUNTER — Other Ambulatory Visit: Payer: Self-pay | Admitting: *Deleted

## 2014-07-31 ENCOUNTER — Other Ambulatory Visit: Payer: Medicare Other

## 2014-07-31 ENCOUNTER — Ambulatory Visit (HOSPITAL_BASED_OUTPATIENT_CLINIC_OR_DEPARTMENT_OTHER): Payer: Medicare Other

## 2014-07-31 DIAGNOSIS — Z5111 Encounter for antineoplastic chemotherapy: Secondary | ICD-10-CM

## 2014-07-31 DIAGNOSIS — C880 Waldenstrom macroglobulinemia: Secondary | ICD-10-CM | POA: Diagnosis not present

## 2014-07-31 MED ORDER — DEXAMETHASONE SODIUM PHOSPHATE 10 MG/ML IJ SOLN
10.0000 mg | Freq: Once | INTRAMUSCULAR | Status: AC
  Administered 2014-07-31: 10 mg via INTRAVENOUS

## 2014-07-31 MED ORDER — ONDANSETRON 8 MG/50ML IVPB (CHCC)
8.0000 mg | Freq: Once | INTRAVENOUS | Status: AC
  Administered 2014-07-31: 8 mg via INTRAVENOUS

## 2014-07-31 MED ORDER — DEXAMETHASONE SODIUM PHOSPHATE 10 MG/ML IJ SOLN
INTRAMUSCULAR | Status: AC
  Filled 2014-07-31: qty 1

## 2014-07-31 MED ORDER — ONDANSETRON HCL 8 MG PO TABS: 8.0000 mg | ORAL_TABLET | Freq: Three times a day (TID) | ORAL | Status: DC | PRN

## 2014-07-31 MED ORDER — SODIUM CHLORIDE 0.9 % IV SOLN
Freq: Once | INTRAVENOUS | Status: AC
  Administered 2014-07-31: 11:00:00 via INTRAVENOUS

## 2014-07-31 MED ORDER — ONDANSETRON 8 MG/NS 50 ML IVPB
INTRAVENOUS | Status: AC
  Filled 2014-07-31: qty 8

## 2014-07-31 MED ORDER — BENDAMUSTINE HCL CHEMO INJECTION 180 MG/2ML
70.0000 mg/m2 | Freq: Once | INTRAVENOUS | Status: AC
  Administered 2014-07-31: 162 mg via INTRAVENOUS
  Filled 2014-07-31: qty 1.8

## 2014-07-31 NOTE — Patient Instructions (Signed)
[facility] Discharge Instructions for Patients Receiving Chemotherapy  Today you received the following chemotherapy agents:Treanda.  To help prevent nausea and vomiting after your treatment, we encourage you to take your nausea medication: Compazine 5 mg every 6 hours as needed.   If you develop nausea and vomiting that is not controlled by your nausea medication, call the clinic.   BELOW ARE SYMPTOMS THAT SHOULD BE REPORTED IMMEDIATELY:  *FEVER GREATER THAN 100.5 F  *CHILLS WITH OR WITHOUT FEVER  NAUSEA AND VOMITING THAT IS NOT CONTROLLED WITH YOUR NAUSEA MEDICATION  *UNUSUAL SHORTNESS OF BREATH  *UNUSUAL BRUISING OR BLEEDING  TENDERNESS IN MOUTH AND THROAT WITH OR WITHOUT PRESENCE OF ULCERS  *URINARY PROBLEMS  *BOWEL PROBLEMS  UNUSUAL RASH Items with * indicate a potential emergency and should be followed up as soon as possible.  Feel free to call the clinic you have any questions or concerns. The clinic phone number is [phone].

## 2014-08-10 ENCOUNTER — Telehealth: Payer: Self-pay | Admitting: *Deleted

## 2014-08-10 ENCOUNTER — Telehealth: Payer: Self-pay | Admitting: Nurse Practitioner

## 2014-08-10 DIAGNOSIS — C880 Waldenstrom macroglobulinemia: Secondary | ICD-10-CM

## 2014-08-10 NOTE — Telephone Encounter (Signed)
Call from pt reporting N/V, diarrhea and weakness. Compazine is effective. Using Imodium. Has had 4 loose stools in 24 hours. Pt also reports temp of 99.8. Offered pt symptom management visit, she declined. Requests to see NP with lab visit on 08/14/14. Reviewed with Dr. Benay Spice: Have pt report to ED for shaking chills or fever.

## 2014-08-10 NOTE — Telephone Encounter (Signed)
S/w pt confirming labs/ov with CB per 01/08 POF..... KJ

## 2014-08-14 ENCOUNTER — Ambulatory Visit (HOSPITAL_BASED_OUTPATIENT_CLINIC_OR_DEPARTMENT_OTHER): Payer: Medicare Other | Admitting: Nurse Practitioner

## 2014-08-14 ENCOUNTER — Other Ambulatory Visit (HOSPITAL_BASED_OUTPATIENT_CLINIC_OR_DEPARTMENT_OTHER): Payer: Medicare Other

## 2014-08-14 VITALS — BP 143/57 | HR 81 | Temp 96.8°F | Resp 18 | Ht 66.0 in | Wt 258.4 lb

## 2014-08-14 DIAGNOSIS — D709 Neutropenia, unspecified: Secondary | ICD-10-CM

## 2014-08-14 DIAGNOSIS — R112 Nausea with vomiting, unspecified: Secondary | ICD-10-CM

## 2014-08-14 DIAGNOSIS — K068 Other specified disorders of gingiva and edentulous alveolar ridge: Secondary | ICD-10-CM | POA: Diagnosis not present

## 2014-08-14 DIAGNOSIS — C8800 Waldenstrom macroglobulinemia not having achieved remission: Secondary | ICD-10-CM

## 2014-08-14 LAB — CBC WITH DIFFERENTIAL/PLATELET
BASO%: 0.9 % (ref 0.0–2.0)
Basophils Absolute: 0 10*3/uL (ref 0.0–0.1)
EOS%: 6.2 % (ref 0.0–7.0)
Eosinophils Absolute: 0.1 10*3/uL (ref 0.0–0.5)
HCT: 26.2 % — ABNORMAL LOW (ref 34.8–46.6)
HGB: 9.2 g/dL — ABNORMAL LOW (ref 11.6–15.9)
LYMPH%: 23.7 % (ref 14.0–49.7)
MCH: 31.8 pg (ref 25.1–34.0)
MCHC: 35.1 g/dL (ref 31.5–36.0)
MCV: 90.7 fL (ref 79.5–101.0)
MONO#: 0.3 10*3/uL (ref 0.1–0.9)
MONO%: 14.7 % — ABNORMAL HIGH (ref 0.0–14.0)
NEUT#: 1.2 10*3/uL — ABNORMAL LOW (ref 1.5–6.5)
NEUT%: 54.5 % (ref 38.4–76.8)
Platelets: 194 10*3/uL (ref 145–400)
RBC: 2.89 10*6/uL — ABNORMAL LOW (ref 3.70–5.45)
RDW: 21 % — ABNORMAL HIGH (ref 11.2–14.5)
WBC: 2.1 10*3/uL — ABNORMAL LOW (ref 3.9–10.3)
lymph#: 0.5 10*3/uL — ABNORMAL LOW (ref 0.9–3.3)
nRBC: 0 % (ref 0–0)

## 2014-08-14 LAB — COMPREHENSIVE METABOLIC PANEL (CC13)
ALT: 10 U/L (ref 0–55)
AST: 18 U/L (ref 5–34)
Albumin: 3.1 g/dL — ABNORMAL LOW (ref 3.5–5.0)
Alkaline Phosphatase: 71 U/L (ref 40–150)
Anion Gap: 8 mEq/L (ref 3–11)
BUN: 12.2 mg/dL (ref 7.0–26.0)
CO2: 26 mEq/L (ref 22–29)
Calcium: 9 mg/dL (ref 8.4–10.4)
Chloride: 108 mEq/L (ref 98–109)
Creatinine: 0.8 mg/dL (ref 0.6–1.1)
EGFR: 71 mL/min/{1.73_m2} — ABNORMAL LOW (ref >90)
Glucose: 122 mg/dl (ref 70–140)
Potassium: 3.9 mEq/L (ref 3.5–5.1)
Sodium: 142 mEq/L (ref 136–145)
Total Bilirubin: 0.87 mg/dL (ref 0.20–1.20)
Total Protein: 7 g/dL (ref 6.4–8.3)

## 2014-08-14 LAB — TECHNOLOGIST REVIEW

## 2014-08-15 ENCOUNTER — Encounter: Payer: Self-pay | Admitting: Nurse Practitioner

## 2014-08-15 ENCOUNTER — Telehealth: Payer: Self-pay | Admitting: *Deleted

## 2014-08-15 ENCOUNTER — Telehealth: Payer: Self-pay | Admitting: Nurse Practitioner

## 2014-08-15 DIAGNOSIS — R112 Nausea with vomiting, unspecified: Secondary | ICD-10-CM | POA: Insufficient documentation

## 2014-08-15 DIAGNOSIS — D709 Neutropenia, unspecified: Secondary | ICD-10-CM | POA: Insufficient documentation

## 2014-08-15 DIAGNOSIS — K068 Other specified disorders of gingiva and edentulous alveolar ridge: Secondary | ICD-10-CM | POA: Insufficient documentation

## 2014-08-15 NOTE — Assessment & Plan Note (Signed)
Patient received cycle 3 of her Rituxan/bendamustine chemotherapy regimen on 07/30/2014.  She is scheduled for cycle 4 of the same regimen on 08/27/2014.  Patient has requested to move up her marker labs.  Advised patient would schedule marker labs to be drawn on Monday, 08/20/2014 per her request.

## 2014-08-15 NOTE — Telephone Encounter (Signed)
Called patient for follow-up for [employer]. Patient states that she is still having diarrhea but took Imodium and has not had any more since she took it. States that she is doing better. Advised patient of appt with Dr. Benna Dunks tomorrow at 9 am. Patient verbalized understanding.

## 2014-08-15 NOTE — Assessment & Plan Note (Signed)
Patient reports having some nausea and vomiting from Thursday through Sunday this past week.  She states that all nausea/vomiting have since resolved.  She also had a low-grade, intermittent fever at times.  Patient is afebrile today on exam.  Since patient experienced these symptoms approximately 5-7 days following chemotherapy; is quite possible that the symptoms were related to a mild viral illness.  Advised patient to let us know if she experiences any worsening symptoms whatsoever.

## 2014-08-15 NOTE — Progress Notes (Signed)
Called patient's dentist Dr. Benna Dunks # [phone] in regards to patient's dental issues; and was able to obtain a follow-up appointment for further evaluation/management for Thursday generally 14th 2016 at 9:00.  [facility] nurse will call the patient to inform of this appointment.

## 2014-08-15 NOTE — Assessment & Plan Note (Signed)
Patient is complaining of some mouth pain.  On careful exam-patient's left lower back teeth do appear with some gum inflammation and mild tenderness.  Patient states that her left lower back tooth has and pulled; and the tooth adjacent broke off/fell out on 07/27/2014.  Patient states that she had plans to follow-up with her dentist; but has not done so as of yet.  Advised patient would assist by calling patient's dentist Dr.Millsaps in hopes of obtaining a follow-up appointment for sometime this week.  Patient refused prophylactic antibiotics; stating that she preferred to wait on a dental appointment prior to initiating any antibiotic therapy since she is very sensitive to most antibiotics.

## 2014-08-15 NOTE — Telephone Encounter (Signed)
, °

## 2014-08-15 NOTE — Telephone Encounter (Signed)
Faxed 1/12 office note and labs to Dr. Benna Dunks' office.

## 2014-08-15 NOTE — Assessment & Plan Note (Signed)
Patient is slightly neutropenic with an ANC of 1.2 today.  Once again reviewed all neutropenia guidelines with both patient and her family today.

## 2014-08-15 NOTE — Progress Notes (Signed)
will   SYMPTOM MANAGEMENT CLINIC   HPI: Natasha Ellis 81 y.o. female diagnosed with Waldenstrm's macroglobulinemia.  Currently undergoing Rituxan/bendamustine chemotherapy regimen.  Patient called the cancer Center today requesting urgent care visit.  Patient reports having some nausea/vomiting/intermittent fevers from Thursday to Sunday of last week.  She states that all symptoms have since resolved.  She denied feeling dehydrated today.  Patient is complaining of some mild lower left gum/dental pain.  Patient states her left lower back tooth has been pulled in the past; but that the tooth adjacent broken fell out on Christmas Day.  She has not yet followed up with her dentist for further evaluation and treatment.  She denies any recent fevers or chills.   HPI  CURRENT THERAPY: Upcoming Treatment Dates - NON-HODGKINS LYMPHOMA Rituximab D1 / Bendamustine D1,2 q28d Days with orders from any treatment category:  08/27/2014      SCHEDULING COMMUNICATION      famotidine (PEPCID) IVPB 20 mg      ondansetron (ZOFRAN) IVPB 8 mg      dexamethasone (DECADRON) injection 10 mg      acetaminophen (TYLENOL) tablet 650 mg      diphenhydrAMINE (BENADRYL) capsule 50 mg      riTUXimab (RITUXAN) 900 mg in sodium chloride 0.9 % 250 mL (2.6471 mg/mL) chemo infusion      bendamustine (TREANDA) 162 mg in sodium chloride 0.9 % 500 mL chemo infusion      sodium chloride 0.9 % injection 10 mL      heparin lock flush 100 unit/mL      heparin lock flush 100 unit/mL      alteplase (CATHFLO ACTIVASE) injection 2 mg      sodium chloride 0.9 % injection 3 mL      diphenhydrAMINE (BENADRYL) injection 25 mg      famotidine (PEPCID) IVPB 20 mg      0.9 %  sodium chloride infusion      methylPREDNISolone sodium succinate (SOLU-MEDROL) 125 mg/2 mL injection 125 mg      EPINEPHrine (ADRENALIN) 0.1 MG/ML injection 0.25 mg      EPINEPHrine (ADRENALIN) 0.1 MG/ML injection 0.25 mg      EPINEPHrine (ADRENALIN) injection  0.5 mg      EPINEPHrine (ADRENALIN) injection 0.5 mg      diphenhydrAMINE (BENADRYL) injection 50 mg      albuterol (PROVENTIL) (2.5 MG/3ML) 0.083% nebulizer solution 2.5 mg      0.9 %  sodium chloride infusion      TREATMENT CONDITIONS 08/28/2014      SCHEDULING COMMUNICATION      ondansetron (ZOFRAN) IVPB 8 mg      dexamethasone (DECADRON) injection 10 mg      bendamustine (TREANDA) 162 mg in sodium chloride 0.9 % 500 mL chemo infusion      sodium chloride 0.9 % injection 10 mL      heparin lock flush 100 unit/mL      heparin lock flush 100 unit/mL      alteplase (CATHFLO ACTIVASE) injection 2 mg      sodium chloride 0.9 % injection 3 mL      0.9 %  sodium chloride infusion    ROS  Past Medical History  Diagnosis Date  . Malignant lymphoplasmacytic lymphoma 2004  . Hypertension   . Arthritis   . Hypercholesteremia   . IBS (irritable bowel syndrome)   . Diverticulosis of intestine     H/O  . DJD (degenerative joint disease) of lumbar  spine   . Left knee DJD   . Right knee DJD   . Chronic cough   . Asthma   . Cataract     Past Surgical History  Procedure Laterality Date  . Tonsillectomy and adenoidectomy  childhood  . Abdominal hysterectomy  1984    TAH    has Chronic cough; Dyspnea; HTN (hypertension); HLD (hyperlipidemia); Lacunar infarction; OSA on CPAP; GERD (gastroesophageal reflux disease); TIA (transient ischemic attack); Back pain; Asthma; Waldenstrom's macroglobulinemia; Hypersensitivity reaction; Nausea with vomiting; Pain in gums; and Neutropenia on her problem list.     is allergic to codeine; erythromycin; lisinopril; oxybutynin; and sulfa antibiotics.    Medication List       This list is accurate as of: 08/14/14 11:59 PM.  Always use your most recent med list.               acetaminophen 500 MG tablet  Commonly known as:  TYLENOL  Take 500 mg by mouth every 6 (six) hours as needed for pain.     albuterol 108 (90 BASE) MCG/ACT inhaler    Commonly known as:  PROVENTIL HFA;VENTOLIN HFA  Inhale 1-2 puffs into the lungs every 6 (six) hours as needed for wheezing or shortness of breath.     alendronate 10 MG tablet  Commonly known as:  FOSAMAX  Take 10 mg by mouth once a week. Take with a full glass of water on an empty stomach.     cholecalciferol 1000 UNITS tablet  Commonly known as:  VITAMIN D  Take 1,000 Units by mouth daily.     cholestyramine 4 GM/DOSE powder  Commonly known as:  QUESTRAN  Take 4 g by mouth daily as needed.     clopidogrel 75 MG tablet  Commonly known as:  PLAVIX  Take 1 tablet (75 mg total) by mouth daily with breakfast.     fluticasone 50 MCG/ACT nasal spray  Commonly known as:  FLONASE  Place 1 spray into the nose 2 (two) times daily as needed. Each nostril     furosemide 20 MG tablet  Commonly known as:  LASIX  Take 1 tablet (20 mg total) by mouth daily.     ibuprofen 200 MG tablet  Commonly known as:  ADVIL,MOTRIN  Take 200 mg by mouth every 6 (six) hours as needed for pain.     loperamide 2 MG capsule  Commonly known as:  IMODIUM  Take 1 capsule (2 mg total) by mouth 4 (four) times daily as needed for diarrhea or loose stools.     multivitamin capsule  Take 1 capsule by mouth daily. With Vitamin D 4000     ondansetron 8 MG tablet  Commonly known as:  ZOFRAN  Take 1 tablet (8 mg total) by mouth every 8 (eight) hours as needed for nausea or vomiting.     potassium chloride 10 MEQ tablet  Commonly known as:  K-DUR  Take 10 mEq by mouth daily.     prochlorperazine 5 MG tablet  Commonly known as:  COMPAZINE  Take 1 tablet (5 mg total) by mouth every 6 (six) hours as needed for nausea or vomiting.         PHYSICAL EXAMINATION  Blood pressure 143/57, pulse 81, temperature 96.8 F (36 C), temperature source Oral, resp. rate 18, height $RemoveBe'5\' 6"'bakIkhNcx$  (1.676 m), weight 258 lb 6.4 oz (117.209 kg), SpO2 97 %.  Physical Exam  Constitutional: She is oriented to person, place, and time and  well-developed,  well-nourished, and in no distress.  HENT:  Head: Normocephalic and atraumatic.  Mouth/Throat: Oropharynx is clear and moist.  Left lower back tooth missing; but does appear to still have a root intact.  Left lower tooth adjacent back tooth appears broken off to the gum.  Surrounding gum tissue with slight erythema and edema.  Questionable mild tenderness on exam.  Eyes: Conjunctivae and EOM are normal. Pupils are equal, round, and reactive to light. Right eye exhibits no discharge. Left eye exhibits no discharge. No scleral icterus.  Neck: Normal range of motion. Neck supple. No JVD present. No tracheal deviation present. No thyromegaly present.  Cardiovascular: Normal rate, regular rhythm, normal heart sounds and intact distal pulses.   Pulmonary/Chest: Effort normal and breath sounds normal. No respiratory distress. She has no wheezes. She has no rales. She exhibits no tenderness.  Abdominal: Soft. Bowel sounds are normal. She exhibits no distension and no mass. There is no tenderness. There is no rebound and no guarding.  Musculoskeletal: Normal range of motion. She exhibits no edema or tenderness.  Lymphadenopathy:    She has no cervical adenopathy.  Neurological: She is alert and oriented to person, place, and time. Gait normal.  Skin: Skin is warm and dry. No rash noted. No erythema.  Psychiatric: Affect normal.  Nursing note and vitals reviewed.   LABORATORY DATA:. Appointment on 08/14/2014  Component Date Value Ref Range Status  . WBC 08/14/2014 2.1* 3.9 - 10.3 10e3/uL Final  . NEUT# 08/14/2014 1.2* 1.5 - 6.5 10e3/uL Final  . HGB 08/14/2014 9.2* 11.6 - 15.9 g/dL Final  . HCT 19/01/1725 26.2* 34.8 - 46.6 % Final  . Platelets 08/14/2014 194  145 - 400 10e3/uL Final  . MCV 08/14/2014 90.7  79.5 - 101.0 fL Final  . MCH 08/14/2014 31.8  25.1 - 34.0 pg Final  . MCHC 08/14/2014 35.1  31.5 - 36.0 g/dL Final  . RBC 91/36/4062 2.89* 3.70 - 5.45 10e6/uL Final  . RDW  08/14/2014 21.0* 11.2 - 14.5 % Final  . lymph# 08/14/2014 0.5* 0.9 - 3.3 10e3/uL Final  . MONO# 08/14/2014 0.3  0.1 - 0.9 10e3/uL Final  . Eosinophils Absolute 08/14/2014 0.1  0.0 - 0.5 10e3/uL Final  . Basophils Absolute 08/14/2014 0.0  0.0 - 0.1 10e3/uL Final  . NEUT% 08/14/2014 54.5  38.4 - 76.8 % Final  . LYMPH% 08/14/2014 23.7  14.0 - 49.7 % Final  . MONO% 08/14/2014 14.7* 0.0 - 14.0 % Final  . EOS% 08/14/2014 6.2  0.0 - 7.0 % Final  . BASO% 08/14/2014 0.9  0.0 - 2.0 % Final  . nRBC 08/14/2014 0  0 - 0 % Final  . Sodium 08/14/2014 142  136 - 145 mEq/L Final  . Potassium 08/14/2014 3.9  3.5 - 5.1 mEq/L Final  . Chloride 08/14/2014 108  98 - 109 mEq/L Final  . CO2 08/14/2014 26  22 - 29 mEq/L Final  . Glucose 08/14/2014 122  70 - 140 mg/dl Final  . BUN 55/29/8471 12.2  7.0 - 26.0 mg/dL Final  . Creatinine 15/65/9644 0.8  0.6 - 1.1 mg/dL Final  . Total Bilirubin 08/14/2014 0.87  0.20 - 1.20 mg/dL Final  . Alkaline Phosphatase 08/14/2014 71  40 - 150 U/L Final  . AST 08/14/2014 18  5 - 34 U/L Final  . ALT 08/14/2014 10  0 - 55 U/L Final  . Total Protein 08/14/2014 7.0  6.4 - 8.3 g/dL Final  . Albumin 36/67/2862 3.1* 3.5 - 5.0  g/dL Final  . Calcium 08/14/2014 9.0  8.4 - 10.4 mg/dL Final  . Anion Gap 08/14/2014 8  3 - 11 mEq/L Final  . EGFR 08/14/2014 71* >90 ml/min/1.73 m2 Final   eGFR is calculated using the CKD-EPI Creatinine Equation (2009)  . Technologist Review 08/14/2014 cbc performed on warmed sample   Final     RADIOGRAPHIC STUDIES: No results found.  ASSESSMENT/PLAN:    Nausea with vomiting Patient reports having some nausea and vomiting from Thursday through Sunday this past week.  She states that all nausea/vomiting have since resolved.  She also had a low-grade, intermittent fever at times.  Patient is afebrile today on exam.  Since patient experienced these symptoms approximately 5-7 days following chemotherapy; is quite possible that the symptoms were related to a  mild viral illness.  Advised patient to let us know if she experiences any worsening symptoms whatsoever.   Neutropenia Patient is slightly neutropenic with an ANC of 1.2 today.  Once again reviewed all neutropenia guidelines with both patient and her family today.     Pain in gums Patient is complaining of some mouth pain.  On careful exam-patient's left lower back teeth do appear with some gum inflammation and mild tenderness.  Patient states that her left lower back tooth has and pulled; and the tooth adjacent broke off/fell out on 07/27/2014.  Patient states that she had plans to follow-up with her dentist; but has not done so as of yet.  Advised patient would assist by calling patient's dentist Dr.Millsaps in hopes of obtaining a follow-up appointment for sometime this week.  Patient refused prophylactic antibiotics; stating that she preferred to wait on a dental appointment prior to initiating any antibiotic therapy since she is very sensitive to most antibiotics.     Waldenstrom's macroglobulinemia Patient received cycle 3 of her Rituxan/bendamustine chemotherapy regimen on 07/30/2014.  She is scheduled for cycle 4 of the same regimen on 08/27/2014.  Patient has requested to move up her marker labs.  Advised patient would schedule marker labs to be drawn on Monday, 08/20/2014 per her request.   Patient stated understanding of all instructions; and was in agreement with this plan of care. The patient knows to call the clinic with any problems, questions or concerns.   Review/collaboration with Dr. Benay Spice regarding all aspects of patient's visit today.   Total time spent with patient was 25 minutes;  with greater than 75 percent of that time spent in face to face counseling regarding her symptoms and coordination of care and follow up.  Disclaimer: This note was dictated with voice recognition software. Similar sounding words can inadvertently be transcribed and may not be corrected  upon review.   Drue Second, NP 08/15/2014

## 2014-08-16 ENCOUNTER — Telehealth: Payer: Self-pay | Admitting: *Deleted

## 2014-08-16 ENCOUNTER — Telehealth: Payer: Self-pay | Admitting: Nurse Practitioner

## 2014-08-16 NOTE — Telephone Encounter (Signed)
Spoke at length with patient's dentist Dr. Benna Dunks regarding need for possible extraction of 2 lower left back teeth that may be a source of infection.  Reviewed patient's lab results from 08/14/2014.  Also reviewed med list; and noted that patient does take Fosamax on a weekly basis.  Patient also has Plavix on her medication list as well.  Patient is allergic to both erythromycin and sulfa.  Dr. Benna Dunks advised that he was planning to consult an oral surgeon in regards to the splenic care regarding patient's dental procedures.  Advised Dr. Pablo Lawrence could possibly hold chemotherapy for an additional week to allow for further dental healing if needed.  Dr. Benna Dunks stated that he would call if he has any further questions.

## 2014-08-16 NOTE — Telephone Encounter (Signed)
Patient has #2 teeth that are a source of infection. Asking if there is a window of time when it would be safe to pull them? Also would zithromycin antibiotic be OK for her? He can be reached anytime today except from 1-2 pm (lunch).

## 2014-08-20 ENCOUNTER — Other Ambulatory Visit (HOSPITAL_BASED_OUTPATIENT_CLINIC_OR_DEPARTMENT_OTHER): Payer: Medicare Other

## 2014-08-20 DIAGNOSIS — C880 Waldenstrom macroglobulinemia: Secondary | ICD-10-CM | POA: Diagnosis not present

## 2014-08-20 DIAGNOSIS — C8300 Small cell B-cell lymphoma, unspecified site: Secondary | ICD-10-CM | POA: Diagnosis not present

## 2014-08-22 ENCOUNTER — Other Ambulatory Visit: Payer: Self-pay | Admitting: Nurse Practitioner

## 2014-08-22 ENCOUNTER — Telehealth: Payer: Self-pay | Admitting: *Deleted

## 2014-08-22 LAB — PROTEIN ELECTROPHORESIS, SERUM
Albumin ELP: 49.4 % — ABNORMAL LOW (ref 55.8–66.1)
Alpha-1-Globulin: 4.9 % (ref 2.9–4.9)
Alpha-2-Globulin: 9 % (ref 7.1–11.8)
Beta 2: 3.1 % — ABNORMAL LOW (ref 3.2–6.5)
Beta Globulin: 4.9 % (ref 4.7–7.2)
Gamma Globulin: 28.7 % — ABNORMAL HIGH (ref 11.1–18.8)
M-Spike, %: 1.84 g/dL
Total Protein, Serum Electrophoresis: 7.2 g/dL (ref 6.0–8.3)

## 2014-08-22 LAB — IGM: IgM, Serum: 2930 mg/dL — ABNORMAL HIGH (ref 52–322)

## 2014-08-22 NOTE — Telephone Encounter (Signed)
Pt called back stating "I was bundled up in a lot of clothes and blanket and took off all of that and my temp is 99.6; and I really want to wait before going to ED if possible"  Instructed pt that if shaking chills later tonight and fever goes up; to go to ED tonight.  Pt verbalized understanding and states "I will do that for sure"  Selena Lesser, NP notified.

## 2014-08-22 NOTE — Telephone Encounter (Signed)
Pt called states "I have a fever 100.4; what do I need to do?"  Pt reports she had shaking chills earlier this afternoon but not now and X1 diarrhea "but that's not unusual for me"  Pt is scheduled to have two teeth pulled 08/23/14; currently on Z-pack.  After discussion with Michel Harrow, NP; instructed pt to go to the ED for further evaluation of fever.  Pt. Verbalized understanding and states she will go.

## 2014-08-26 ENCOUNTER — Other Ambulatory Visit: Payer: Self-pay | Admitting: Oncology

## 2014-08-27 ENCOUNTER — Telehealth: Payer: Self-pay | Admitting: *Deleted

## 2014-08-27 ENCOUNTER — Telehealth: Payer: Self-pay | Admitting: Oncology

## 2014-08-27 ENCOUNTER — Ambulatory Visit (HOSPITAL_BASED_OUTPATIENT_CLINIC_OR_DEPARTMENT_OTHER): Payer: Medicare Other | Admitting: Oncology

## 2014-08-27 ENCOUNTER — Ambulatory Visit: Payer: Medicare Other

## 2014-08-27 ENCOUNTER — Encounter: Payer: Self-pay | Admitting: Oncology

## 2014-08-27 ENCOUNTER — Other Ambulatory Visit (HOSPITAL_BASED_OUTPATIENT_CLINIC_OR_DEPARTMENT_OTHER): Payer: Medicare Other

## 2014-08-27 VITALS — BP 121/53 | HR 84 | Temp 98.0°F | Resp 19 | Ht 66.0 in | Wt 256.8 lb

## 2014-08-27 DIAGNOSIS — C880 Waldenstrom macroglobulinemia: Secondary | ICD-10-CM

## 2014-08-27 LAB — CBC WITH DIFFERENTIAL/PLATELET
BASO%: 1 % (ref 0.0–2.0)
Basophils Absolute: 0 10*3/uL (ref 0.0–0.1)
EOS%: 7.6 % — ABNORMAL HIGH (ref 0.0–7.0)
Eosinophils Absolute: 0.2 10*3/uL (ref 0.0–0.5)
HCT: 29.6 % — ABNORMAL LOW (ref 34.8–46.6)
HGB: 9.6 g/dL — ABNORMAL LOW (ref 11.6–15.9)
LYMPH%: 24 % (ref 14.0–49.7)
MCH: 27 pg (ref 25.1–34.0)
MCHC: 32.3 g/dL (ref 31.5–36.0)
MCV: 83.7 fL (ref 79.5–101.0)
MONO#: 0.4 10*3/uL (ref 0.1–0.9)
MONO%: 16.5 % — ABNORMAL HIGH (ref 0.0–14.0)
NEUT#: 1.1 10*3/uL — ABNORMAL LOW (ref 1.5–6.5)
NEUT%: 50.9 % (ref 38.4–76.8)
Platelets: 265 10*3/uL (ref 145–400)
RBC: 3.54 10*6/uL — ABNORMAL LOW (ref 3.70–5.45)
RDW: 17.9 % — ABNORMAL HIGH (ref 11.2–14.5)
WBC: 2.2 10*3/uL — ABNORMAL LOW (ref 3.9–10.3)
lymph#: 0.5 10*3/uL — ABNORMAL LOW (ref 0.9–3.3)

## 2014-08-27 LAB — COMPREHENSIVE METABOLIC PANEL (CC13)
ALT: 14 U/L (ref 0–55)
AST: 16 U/L (ref 5–34)
Albumin: 3.2 g/dL — ABNORMAL LOW (ref 3.5–5.0)
Alkaline Phosphatase: 79 U/L (ref 40–150)
Anion Gap: 8 mEq/L (ref 3–11)
BUN: 10.7 mg/dL (ref 7.0–26.0)
CO2: 25 mEq/L (ref 22–29)
Calcium: 9 mg/dL (ref 8.4–10.4)
Chloride: 109 mEq/L (ref 98–109)
Creatinine: 0.8 mg/dL (ref 0.6–1.1)
EGFR: 68 mL/min/{1.73_m2} — ABNORMAL LOW (ref >90)
Glucose: 113 mg/dl (ref 70–140)
Potassium: 4.1 mEq/L (ref 3.5–5.1)
Sodium: 143 mEq/L (ref 136–145)
Total Bilirubin: 0.64 mg/dL (ref 0.20–1.20)
Total Protein: 7.5 g/dL (ref 6.4–8.3)

## 2014-08-27 NOTE — Telephone Encounter (Signed)
gv and printed appt sched and avs for pt for Feb and March....sed added tx....emailed MW to add tx for 2.1

## 2014-08-27 NOTE — Progress Notes (Signed)
  [facility] OFFICE PROGRESS NOTE   Diagnosis: Waldenstrm's macroglobulinemia  INTERVAL HISTORY:   Ms. Hendler returns as scheduled. She completed a third cycle of bendamustine/rituximab beginning 07/02/2014. She reports increased fatigue following chemotherapy. She has chronic exertional dyspnea. She is no longer using Symbicort. Had nausea after the last cycle of chemo which has now resolved. She was also having mouth pain with intermittent low-grade fevers. She has been seen by her dentist who removed a bruits of a broken tooth. Her pain has now resolved and she is no longer having low-grade fevers.  Objective:  Vital signs in last 24 hours:  Blood pressure 121/53, pulse 84, temperature 98 F (36.7 C), temperature source Oral, resp. rate 19, height 5\' 6"  (1.676 m), weight 256 lb 12.8 oz (116.484 kg), SpO2 96 %.    HEENT: No thrush or ulcers Resp: Scattered inspiratory rhonchi at the posterior basis with wheezing at the left posterior base, no respiratory distress at rest Cardio: Regular rate and rhythm GI: No hepatosplenomegaly Vascular: No leg edema   Lab Results:  Lab Results  Component Value Date   WBC 2.2* 08/27/2014   HGB 9.6* 08/27/2014   HCT 29.6* 08/27/2014   MCV 83.7 08/27/2014   PLT 265 08/27/2014   NEUTROABS 1.1* 08/27/2014     Medications: I have reviewed the patient's current medications.  Assessment/Plan: 1. 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 Treated with multiple systemic therapies includingpentostatin/Cytoxan/rituximab in 2010.   Cycle 1 bendamustine/Rituxan 05/31/2014.  Cycle 2 bendamustine/Rituxan 07/02/2014 2. Left knee arthritis. Followed by Dr. Wynelle Link. 3. Sleep apnea. 4. Anemia. Chronic. 5. Question left parotitis 12/12/2013 presenting with  left facial and parotid edema.   Disposition:  She has completed 3 cycles of bendamustine/rituximab. ANC today is 1.1. Counts have been reviewed with Dr. Benay Spice and we will hold her chemotherapy today. Recheck counts in one week and proceed with cycle 4 at that time if her counts recover. Consider Neulasta if her counts do not improve.  [name] level is trending downward on chemotherapy. We will recheck this again in 1 week prior to cycle 5. She will be scheduled for an office visit with the start of cycle 5..  Autie Vasudevan, [place], DNP, AGPCNP-BC, AOCNP  08/27/2014  12:58 PM

## 2014-08-27 NOTE — Telephone Encounter (Signed)
s.w. pt dtr and confirmed change time on 2.1.....ok and aware

## 2014-08-27 NOTE — Telephone Encounter (Signed)
Per staff message and POF I have scheduled appts. Advised scheduler of appts. JMW  

## 2014-08-28 ENCOUNTER — Ambulatory Visit: Payer: Medicare Other

## 2014-09-03 ENCOUNTER — Other Ambulatory Visit: Payer: Self-pay | Admitting: *Deleted

## 2014-09-03 ENCOUNTER — Telehealth: Payer: Self-pay | Admitting: Oncology

## 2014-09-03 ENCOUNTER — Other Ambulatory Visit (HOSPITAL_BASED_OUTPATIENT_CLINIC_OR_DEPARTMENT_OTHER): Payer: Medicare Other

## 2014-09-03 ENCOUNTER — Telehealth: Payer: Self-pay | Admitting: *Deleted

## 2014-09-03 ENCOUNTER — Ambulatory Visit: Payer: Medicare Other

## 2014-09-03 ENCOUNTER — Other Ambulatory Visit: Payer: Medicare Other

## 2014-09-03 DIAGNOSIS — C880 Waldenstrom macroglobulinemia: Secondary | ICD-10-CM

## 2014-09-03 LAB — CBC WITH DIFFERENTIAL/PLATELET
BASO%: 1.2 % (ref 0.0–2.0)
Basophils Absolute: 0 10*3/uL (ref 0.0–0.1)
EOS%: 7.5 % — ABNORMAL HIGH (ref 0.0–7.0)
Eosinophils Absolute: 0.2 10*3/uL (ref 0.0–0.5)
HCT: 29.8 % — ABNORMAL LOW (ref 34.8–46.6)
HGB: 10 g/dL — ABNORMAL LOW (ref 11.6–15.9)
LYMPH%: 25.6 % (ref 14.0–49.7)
MCH: 29.4 pg (ref 25.1–34.0)
MCHC: 33.4 g/dL (ref 31.5–36.0)
MCV: 88 fL (ref 79.5–101.0)
MONO#: 0.2 10*3/uL (ref 0.1–0.9)
MONO%: 10.6 % (ref 0.0–14.0)
NEUT#: 1.2 10*3/uL — ABNORMAL LOW (ref 1.5–6.5)
NEUT%: 55.1 % (ref 38.4–76.8)
Platelets: 238 10*3/uL (ref 145–400)
RBC: 3.39 10*6/uL — ABNORMAL LOW (ref 3.70–5.45)
RDW: 18.2 % — ABNORMAL HIGH (ref 11.2–14.5)
WBC: 2.2 10*3/uL — ABNORMAL LOW (ref 3.9–10.3)
lymph#: 0.6 10*3/uL — ABNORMAL LOW (ref 0.9–3.3)

## 2014-09-03 LAB — COMPREHENSIVE METABOLIC PANEL (CC13)
ALT: 16 U/L (ref 0–55)
AST: 16 U/L (ref 5–34)
Albumin: 3.3 g/dL — ABNORMAL LOW (ref 3.5–5.0)
Alkaline Phosphatase: 78 U/L (ref 40–150)
Anion Gap: 11 mEq/L (ref 3–11)
BUN: 11.9 mg/dL (ref 7.0–26.0)
CO2: 24 mEq/L (ref 22–29)
Calcium: 9.1 mg/dL (ref 8.4–10.4)
Chloride: 110 mEq/L — ABNORMAL HIGH (ref 98–109)
Creatinine: 0.8 mg/dL (ref 0.6–1.1)
EGFR: 72 mL/min/{1.73_m2} — ABNORMAL LOW (ref >90)
Glucose: 100 mg/dl (ref 70–140)
Potassium: 4.2 mEq/L (ref 3.5–5.1)
Sodium: 145 mEq/L (ref 136–145)
Total Bilirubin: 0.65 mg/dL (ref 0.20–1.20)
Total Protein: 7.3 g/dL (ref 6.4–8.3)

## 2014-09-03 NOTE — Telephone Encounter (Signed)
Pt confirmed labs/ov per 02/01 POF, gave pt AVS.... KJ, sent msg to add chemo and r/s this weeks and wk of 02/29 chemo... KJ

## 2014-09-03 NOTE — Progress Notes (Signed)
Pt entered clinic for labs and treatment. Abs Netu  Noted at 1.2. Labs  Discussed with Dr. Benay Spice. Pt is to return next week for labs and possible treatment. Pt  Taken to scheduling for new appointments for next week.

## 2014-09-03 NOTE — Telephone Encounter (Signed)
Patient confirm change for 02/08 lab closer to treatment schedule.

## 2014-09-03 NOTE — Telephone Encounter (Signed)
Per staff message and POF I have scheduled appts. Advised scheduler of appts and to move labs. JMW  

## 2014-09-04 ENCOUNTER — Ambulatory Visit: Payer: Medicare Other

## 2014-09-10 ENCOUNTER — Ambulatory Visit (HOSPITAL_BASED_OUTPATIENT_CLINIC_OR_DEPARTMENT_OTHER): Payer: Medicare Other

## 2014-09-10 ENCOUNTER — Other Ambulatory Visit: Payer: Medicare Other

## 2014-09-10 ENCOUNTER — Other Ambulatory Visit (HOSPITAL_BASED_OUTPATIENT_CLINIC_OR_DEPARTMENT_OTHER): Payer: Medicare Other

## 2014-09-10 DIAGNOSIS — Z5111 Encounter for antineoplastic chemotherapy: Secondary | ICD-10-CM | POA: Diagnosis not present

## 2014-09-10 DIAGNOSIS — Z5112 Encounter for antineoplastic immunotherapy: Secondary | ICD-10-CM

## 2014-09-10 DIAGNOSIS — C8300 Small cell B-cell lymphoma, unspecified site: Secondary | ICD-10-CM

## 2014-09-10 DIAGNOSIS — C880 Waldenstrom macroglobulinemia: Secondary | ICD-10-CM

## 2014-09-10 LAB — COMPREHENSIVE METABOLIC PANEL (CC13)
ALT: 14 U/L (ref 0–55)
AST: 18 U/L (ref 5–34)
Albumin: 3.2 g/dL — ABNORMAL LOW (ref 3.5–5.0)
Alkaline Phosphatase: 81 U/L (ref 40–150)
Anion Gap: 11 mEq/L (ref 3–11)
BUN: 12.6 mg/dL (ref 7.0–26.0)
CO2: 24 mEq/L (ref 22–29)
Calcium: 9 mg/dL (ref 8.4–10.4)
Chloride: 109 mEq/L (ref 98–109)
Creatinine: 0.8 mg/dL (ref 0.6–1.1)
EGFR: 70 mL/min/{1.73_m2} — ABNORMAL LOW (ref >90)
Glucose: 93 mg/dl (ref 70–140)
Potassium: 4.1 mEq/L (ref 3.5–5.1)
Sodium: 144 mEq/L (ref 136–145)
Total Bilirubin: 0.66 mg/dL (ref 0.20–1.20)
Total Protein: 7.4 g/dL (ref 6.4–8.3)

## 2014-09-10 LAB — CBC WITH DIFFERENTIAL/PLATELET
BASO%: 0.6 % (ref 0.0–2.0)
Basophils Absolute: 0 10*3/uL (ref 0.0–0.1)
EOS%: 7.4 % — ABNORMAL HIGH (ref 0.0–7.0)
Eosinophils Absolute: 0.2 10*3/uL (ref 0.0–0.5)
HCT: 31.4 % — ABNORMAL LOW (ref 34.8–46.6)
HGB: 10.1 g/dL — ABNORMAL LOW (ref 11.6–15.9)
LYMPH%: 26.6 % (ref 14.0–49.7)
MCH: 27.2 pg (ref 25.1–34.0)
MCHC: 32.1 g/dL (ref 31.5–36.0)
MCV: 84.8 fL (ref 79.5–101.0)
MONO#: 0.3 10*3/uL (ref 0.1–0.9)
MONO%: 11.7 % (ref 0.0–14.0)
NEUT#: 1.4 10*3/uL — ABNORMAL LOW (ref 1.5–6.5)
NEUT%: 53.7 % (ref 38.4–76.8)
Platelets: 242 10*3/uL (ref 145–400)
RBC: 3.71 10*6/uL (ref 3.70–5.45)
RDW: 18.6 % — ABNORMAL HIGH (ref 11.2–14.5)
WBC: 2.5 10*3/uL — ABNORMAL LOW (ref 3.9–10.3)
lymph#: 0.7 10*3/uL — ABNORMAL LOW (ref 0.9–3.3)

## 2014-09-10 MED ORDER — DEXAMETHASONE SODIUM PHOSPHATE 10 MG/ML IJ SOLN
INTRAMUSCULAR | Status: AC
  Filled 2014-09-10: qty 1

## 2014-09-10 MED ORDER — FAMOTIDINE IN NACL 20-0.9 MG/50ML-% IV SOLN
20.0000 mg | Freq: Two times a day (BID) | INTRAVENOUS | Status: DC
  Administered 2014-09-10 (×2): 20 mg via INTRAVENOUS

## 2014-09-10 MED ORDER — FAMOTIDINE IN NACL 20-0.9 MG/50ML-% IV SOLN
INTRAVENOUS | Status: AC
  Filled 2014-09-10: qty 50

## 2014-09-10 MED ORDER — DIPHENHYDRAMINE HCL 25 MG PO CAPS
ORAL_CAPSULE | ORAL | Status: AC
  Filled 2014-09-10: qty 2

## 2014-09-10 MED ORDER — DEXAMETHASONE SODIUM PHOSPHATE 10 MG/ML IJ SOLN
10.0000 mg | Freq: Once | INTRAMUSCULAR | Status: AC
  Administered 2014-09-10: 10 mg via INTRAVENOUS

## 2014-09-10 MED ORDER — DIPHENHYDRAMINE HCL 25 MG PO CAPS
50.0000 mg | ORAL_CAPSULE | Freq: Once | ORAL | Status: AC
  Administered 2014-09-10: 50 mg via ORAL

## 2014-09-10 MED ORDER — SODIUM CHLORIDE 0.9 % IV SOLN
375.0000 mg/m2 | Freq: Once | INTRAVENOUS | Status: AC
  Administered 2014-09-10: 900 mg via INTRAVENOUS
  Filled 2014-09-10: qty 90

## 2014-09-10 MED ORDER — ACETAMINOPHEN 325 MG PO TABS
650.0000 mg | ORAL_TABLET | Freq: Once | ORAL | Status: AC
  Administered 2014-09-10: 650 mg via ORAL

## 2014-09-10 MED ORDER — BENDAMUSTINE HCL CHEMO INJECTION 180 MG/2ML
70.0000 mg/m2 | Freq: Once | INTRAVENOUS | Status: AC
  Administered 2014-09-10: 162 mg via INTRAVENOUS
  Filled 2014-09-10: qty 1.8

## 2014-09-10 MED ORDER — ONDANSETRON 8 MG/NS 50 ML IVPB
INTRAVENOUS | Status: AC
  Filled 2014-09-10: qty 8

## 2014-09-10 MED ORDER — ONDANSETRON 8 MG/50ML IVPB (CHCC)
8.0000 mg | Freq: Once | INTRAVENOUS | Status: AC
  Administered 2014-09-10: 8 mg via INTRAVENOUS

## 2014-09-10 MED ORDER — ACETAMINOPHEN 325 MG PO TABS
ORAL_TABLET | ORAL | Status: AC
  Filled 2014-09-10: qty 2

## 2014-09-10 MED ORDER — SODIUM CHLORIDE 0.9 % IV SOLN
Freq: Once | INTRAVENOUS | Status: AC
  Administered 2014-09-10: 13:00:00 via INTRAVENOUS

## 2014-09-10 NOTE — Progress Notes (Signed)
Per Dr.Sherrill, proceed with treatment. Aware ANC is 1.4

## 2014-09-10 NOTE — Patient Instructions (Addendum)
[facility] Discharge Instructions for Patients Receiving Chemotherapy  Today you received the following chemotherapy agents Rituxan and Treanda. To help prevent nausea and vomiting after your treatment, we encourage you to take your nausea medication as prescribed.   If you develop nausea and vomiting that is not controlled by your nausea medication, call the clinic.   BELOW ARE SYMPTOMS THAT SHOULD BE REPORTED IMMEDIATELY:  *FEVER GREATER THAN 100.5 F  *CHILLS WITH OR WITHOUT FEVER  NAUSEA AND VOMITING THAT IS NOT CONTROLLED WITH YOUR NAUSEA MEDICATION  *UNUSUAL SHORTNESS OF BREATH  *UNUSUAL BRUISING OR BLEEDING  TENDERNESS IN MOUTH AND THROAT WITH OR WITHOUT PRESENCE OF ULCERS  *URINARY PROBLEMS  *BOWEL PROBLEMS  UNUSUAL RASH Items with * indicate a potential emergency and should be followed up as soon as possible.  Feel free to call the clinic you have any questions or concerns. The clinic phone number is [phone].    

## 2014-09-11 ENCOUNTER — Other Ambulatory Visit: Payer: Self-pay | Admitting: *Deleted

## 2014-09-11 ENCOUNTER — Telehealth: Payer: Self-pay | Admitting: Oncology

## 2014-09-11 ENCOUNTER — Ambulatory Visit (HOSPITAL_BASED_OUTPATIENT_CLINIC_OR_DEPARTMENT_OTHER): Payer: Medicare Other

## 2014-09-11 DIAGNOSIS — C8300 Small cell B-cell lymphoma, unspecified site: Secondary | ICD-10-CM | POA: Diagnosis not present

## 2014-09-11 DIAGNOSIS — Z5111 Encounter for antineoplastic chemotherapy: Secondary | ICD-10-CM | POA: Diagnosis not present

## 2014-09-11 DIAGNOSIS — C880 Waldenstrom macroglobulinemia: Secondary | ICD-10-CM

## 2014-09-11 MED ORDER — DEXAMETHASONE SODIUM PHOSPHATE 10 MG/ML IJ SOLN
INTRAMUSCULAR | Status: AC
  Filled 2014-09-11: qty 1

## 2014-09-11 MED ORDER — ONDANSETRON 8 MG/NS 50 ML IVPB
INTRAVENOUS | Status: AC
  Filled 2014-09-11: qty 8

## 2014-09-11 MED ORDER — BENDAMUSTINE HCL CHEMO INJECTION 180 MG/2ML
70.0000 mg/m2 | Freq: Once | INTRAVENOUS | Status: AC
  Administered 2014-09-11: 162 mg via INTRAVENOUS
  Filled 2014-09-11: qty 1.8

## 2014-09-11 MED ORDER — SODIUM CHLORIDE 0.9 % IV SOLN
Freq: Once | INTRAVENOUS | Status: AC
  Administered 2014-09-11: 14:00:00 via INTRAVENOUS

## 2014-09-11 MED ORDER — ONDANSETRON 8 MG/50ML IVPB (CHCC)
8.0000 mg | Freq: Once | INTRAVENOUS | Status: AC
  Administered 2014-09-11: 8 mg via INTRAVENOUS

## 2014-09-11 MED ORDER — DEXAMETHASONE SODIUM PHOSPHATE 10 MG/ML IJ SOLN
10.0000 mg | Freq: Once | INTRAMUSCULAR | Status: AC
  Administered 2014-09-11: 10 mg via INTRAVENOUS

## 2014-09-11 NOTE — Telephone Encounter (Signed)
per pof to sch lab-cld & left pt a message w/time & date of sch

## 2014-09-11 NOTE — Patient Instructions (Signed)
[facility] Discharge Instructions for Patients Receiving Chemotherapy  Today you received the following chemotherapy agents: Treanda.  To help prevent nausea and vomiting after your treatment, we encourage you to take your nausea medication:  As directed.   If you develop nausea and vomiting that is not controlled by your nausea medication, call the clinic.   BELOW ARE SYMPTOMS THAT SHOULD BE REPORTED IMMEDIATELY:  *FEVER GREATER THAN 100.5 F  *CHILLS WITH OR WITHOUT FEVER  NAUSEA AND VOMITING THAT IS NOT CONTROLLED WITH YOUR NAUSEA MEDICATION  *UNUSUAL SHORTNESS OF BREATH  *UNUSUAL BRUISING OR BLEEDING  TENDERNESS IN MOUTH AND THROAT WITH OR WITHOUT PRESENCE OF ULCERS  *URINARY PROBLEMS  *BOWEL PROBLEMS  UNUSUAL RASH Items with * indicate a potential emergency and should be followed up as soon as possible.  Feel free to call the clinic you have any questions or concerns. The clinic phone number is [phone].

## 2014-09-17 ENCOUNTER — Telehealth: Payer: Self-pay | Admitting: *Deleted

## 2014-09-17 NOTE — Telephone Encounter (Signed)
Received voice message from patient stating,"I had a fever, chills and diarrhea Friday and Saturday night. Nothing on Sunday. Today my temperature is 98.0. I just wanted to report my symptoms as I was told to do." I asked patient if she would like to come in today and have labs checked but she said "no." She is drinking and eating fine. Feels fine. She denied aches, pain, fevers, nausea and diarrhea. I instructed patient to call [facility] if she wanted her labs checked before next week, 09/25/14, she has a scheduled lab appointment already. Patient verbalized understanding.

## 2014-09-19 ENCOUNTER — Inpatient Hospital Stay (HOSPITAL_COMMUNITY)
Admission: EM | Admit: 2014-09-19 | Discharge: 2014-09-22 | DRG: 193 | Disposition: A | Discharge status: Home / Self Care | Payer: Medicare Other | Attending: Internal Medicine | Admitting: Internal Medicine

## 2014-09-19 ENCOUNTER — Encounter (HOSPITAL_COMMUNITY): Payer: Self-pay | Admitting: Emergency Medicine

## 2014-09-19 ENCOUNTER — Emergency Department (HOSPITAL_COMMUNITY): Payer: Medicare Other

## 2014-09-19 DIAGNOSIS — G4733 Obstructive sleep apnea (adult) (pediatric): Secondary | ICD-10-CM | POA: Diagnosis present

## 2014-09-19 DIAGNOSIS — C880 Waldenstrom macroglobulinemia: Secondary | ICD-10-CM | POA: Diagnosis present

## 2014-09-19 DIAGNOSIS — R0602 Shortness of breath: Secondary | ICD-10-CM | POA: Diagnosis not present

## 2014-09-19 DIAGNOSIS — E876 Hypokalemia: Secondary | ICD-10-CM | POA: Diagnosis present

## 2014-09-19 DIAGNOSIS — Z87891 Personal history of nicotine dependence: Secondary | ICD-10-CM

## 2014-09-19 DIAGNOSIS — R059 Cough, unspecified: Secondary | ICD-10-CM

## 2014-09-19 DIAGNOSIS — Z6841 Body Mass Index (BMI) 40.0 and over, adult: Secondary | ICD-10-CM

## 2014-09-19 DIAGNOSIS — J189 Pneumonia, unspecified organism: Secondary | ICD-10-CM | POA: Diagnosis not present

## 2014-09-19 DIAGNOSIS — R404 Transient alteration of awareness: Secondary | ICD-10-CM | POA: Diagnosis not present

## 2014-09-19 DIAGNOSIS — Z79899 Other long term (current) drug therapy: Secondary | ICD-10-CM | POA: Diagnosis not present

## 2014-09-19 DIAGNOSIS — Z8673 Personal history of transient ischemic attack (TIA), and cerebral infarction without residual deficits: Secondary | ICD-10-CM

## 2014-09-19 DIAGNOSIS — R509 Fever, unspecified: Secondary | ICD-10-CM

## 2014-09-19 DIAGNOSIS — R05 Cough: Secondary | ICD-10-CM

## 2014-09-19 DIAGNOSIS — J9811 Atelectasis: Secondary | ICD-10-CM | POA: Diagnosis not present

## 2014-09-19 DIAGNOSIS — C8590 Non-Hodgkin lymphoma, unspecified, unspecified site: Secondary | ICD-10-CM | POA: Diagnosis present

## 2014-09-19 DIAGNOSIS — R197 Diarrhea, unspecified: Secondary | ICD-10-CM | POA: Diagnosis not present

## 2014-09-19 DIAGNOSIS — Z8579 Personal history of other malignant neoplasms of lymphoid, hematopoietic and related tissues: Secondary | ICD-10-CM

## 2014-09-19 DIAGNOSIS — J9600 Acute respiratory failure, unspecified whether with hypoxia or hypercapnia: Secondary | ICD-10-CM | POA: Diagnosis present

## 2014-09-19 DIAGNOSIS — Y95 Nosocomial condition: Secondary | ICD-10-CM | POA: Diagnosis present

## 2014-09-19 DIAGNOSIS — R918 Other nonspecific abnormal finding of lung field: Secondary | ICD-10-CM | POA: Diagnosis not present

## 2014-09-19 DIAGNOSIS — D630 Anemia in neoplastic disease: Secondary | ICD-10-CM | POA: Diagnosis present

## 2014-09-19 DIAGNOSIS — R531 Weakness: Secondary | ICD-10-CM | POA: Diagnosis not present

## 2014-09-19 HISTORY — DX: Sleep apnea, unspecified: G47.30

## 2014-09-19 HISTORY — DX: Malignant (primary) neoplasm, unspecified: C80.1

## 2014-09-19 LAB — URINALYSIS, ROUTINE W REFLEX MICROSCOPIC
Bilirubin Urine: NEGATIVE
Glucose, UA: NEGATIVE mg/dL
Hgb urine dipstick: NEGATIVE
Ketones, ur: NEGATIVE mg/dL
Leukocytes, UA: NEGATIVE
Nitrite: NEGATIVE
Protein, ur: NEGATIVE mg/dL
Specific Gravity, Urine: 1.01 (ref 1.005–1.030)
Urobilinogen, UA: 0.2 mg/dL (ref 0.0–1.0)
pH: 7 (ref 5.0–8.0)

## 2014-09-19 LAB — COMPREHENSIVE METABOLIC PANEL
ALT: 18 U/L (ref 0–35)
AST: 22 U/L (ref 0–37)
Albumin: 3.6 g/dL (ref 3.5–5.2)
Alkaline Phosphatase: 79 U/L (ref 39–117)
Anion gap: 10 (ref 5–15)
BUN: 10 mg/dL (ref 6–23)
CO2: 25 mmol/L (ref 19–32)
Calcium: 9.3 mg/dL (ref 8.4–10.5)
Chloride: 100 mmol/L (ref 96–112)
Creatinine, Ser: 0.78 mg/dL (ref 0.50–1.10)
GFR calc Af Amer: 88 mL/min — ABNORMAL LOW (ref >90)
GFR calc non Af Amer: 76 mL/min — ABNORMAL LOW (ref >90)
Glucose, Bld: 108 mg/dL — ABNORMAL HIGH (ref 70–99)
Potassium: 4 mmol/L (ref 3.5–5.1)
Sodium: 135 mmol/L (ref 135–145)
Total Bilirubin: 1.3 mg/dL — ABNORMAL HIGH (ref 0.3–1.2)
Total Protein: 7.9 g/dL (ref 6.0–8.3)

## 2014-09-19 LAB — CBC WITH DIFFERENTIAL/PLATELET
Basophils Absolute: 0 10*3/uL (ref 0.0–0.1)
Basophils Relative: 0 % (ref 0–1)
Eosinophils Absolute: 0.1 10*3/uL (ref 0.0–0.7)
Eosinophils Relative: 2 % (ref 0–5)
HCT: 28.3 % — ABNORMAL LOW (ref 36.0–46.0)
Hemoglobin: 10.3 g/dL — ABNORMAL LOW (ref 12.0–15.0)
Lymphocytes Relative: 7 % — ABNORMAL LOW (ref 12–46)
Lymphs Abs: 0.4 10*3/uL — ABNORMAL LOW (ref 0.7–4.0)
MCH: 34.6 pg — ABNORMAL HIGH (ref 26.0–34.0)
MCHC: 36.4 g/dL — ABNORMAL HIGH (ref 30.0–36.0)
MCV: 95 fL (ref 78.0–100.0)
Monocytes Absolute: 0.3 10*3/uL (ref 0.1–1.0)
Monocytes Relative: 5 % (ref 3–12)
Neutro Abs: 5.6 10*3/uL (ref 1.7–7.7)
Neutrophils Relative %: 86 % — ABNORMAL HIGH (ref 43–77)
Platelets: 215 10*3/uL (ref 150–400)
RBC: 2.98 MIL/uL — ABNORMAL LOW (ref 3.87–5.11)
RDW: 21.3 % — ABNORMAL HIGH (ref 11.5–15.5)
WBC: 6.4 10*3/uL (ref 4.0–10.5)

## 2014-09-19 LAB — I-STAT CG4 LACTIC ACID, ED: Lactic Acid, Venous: 1.82 mmol/L (ref 0.5–2.0)

## 2014-09-19 MED ORDER — VANCOMYCIN HCL IN DEXTROSE 1-5 GM/200ML-% IV SOLN
1000.0000 mg | Freq: Once | INTRAVENOUS | Status: AC
  Administered 2014-09-20: 1000 mg via INTRAVENOUS
  Filled 2014-09-19: qty 200

## 2014-09-19 MED ORDER — SODIUM CHLORIDE 0.9 % IV SOLN
INTRAVENOUS | Status: DC
Start: 2014-09-19 — End: 2014-09-20

## 2014-09-19 MED ORDER — PIPERACILLIN-TAZOBACTAM 3.375 G IVPB 30 MIN
3.3750 g | Freq: Once | INTRAVENOUS | Status: AC
  Administered 2014-09-19: 3.375 g via INTRAVENOUS
  Filled 2014-09-19: qty 50

## 2014-09-19 NOTE — ED Notes (Signed)
Brought in by EMS from home with complaints of fever and generalized weakness.  Pt reported that she has Lymphoma--- has had her chemo last Monday and yesterday.  Pt reported that she has been having fever today.

## 2014-09-19 NOTE — ED Notes (Signed)
Bed: WG95 Expected date:  Expected time:  Means of arrival:  Comments: EMS 81 yo female from home/cancer patient/fever since this past weekend/generalized weakness

## 2014-09-19 NOTE — ED Provider Notes (Signed)
CSN: [id number]     Arrival date & time 09/19/14  2127 History   First MD Initiated Contact with Patient 09/19/14 2137     Chief Complaint  Patient presents with  . Fever  . Weakness     (Consider location/radiation/quality/duration/timing/severity/associated sxs/prior Treatment) Patient is a 81 y.o. female presenting with fever and weakness. The history is provided by the patient.  Fever Associated symptoms: chills, cough and diarrhea   Associated symptoms: no chest pain, no confusion, no dysuria, no headaches, no rash, no sore throat and no vomiting   Weakness Associated symptoms include shortness of breath. Pertinent negatives include no chest pain, no abdominal pain and no headaches.  pt with hx lymphoma, s/p 4th round chemotherapy 9 days ago, presents w fever/101, and shaking chills intermittently for the past 3-4 days. Symptoms persistent. +non productive cough/chest congestion. +sob. No cp. No sore throat, runny nose, or other uri c/o. No headache. No neck pain or stiffness. Denies abd pain or nv. Had few episodes diarrhea each of the past few days. Denies recent abx use. No dysuria or gu c/o. No rash/cellulitis.  No lines/ports. No known ill contacts.       Past Medical History  Diagnosis Date  . Cancer     Lymphoma   Past Surgical History  Procedure Laterality Date  . Abdominal hysterectomy     History reviewed. No pertinent family history. History  Substance Use Topics  . Smoking status: Former Research scientist (life sciences)  . Smokeless tobacco: Never Used  . Alcohol Use: No   OB History    No data available     Review of Systems  Constitutional: Positive for fever and chills.  HENT: Negative for sore throat.   Eyes: Negative for redness.  Respiratory: Positive for cough and shortness of breath.   Cardiovascular: Negative for chest pain and leg swelling.  Gastrointestinal: Positive for diarrhea. Negative for vomiting and abdominal pain.  Endocrine: Negative for polyuria.   Genitourinary: Negative for dysuria and flank pain.  Musculoskeletal: Negative for back pain and neck pain.  Skin: Negative for rash.  Neurological: Positive for weakness. Negative for headaches.  Hematological: Does not bruise/bleed easily.  Psychiatric/Behavioral: Negative for confusion.      Allergies  Lisinopril  Home Medications   Prior to Admission medications   Medication Sig Start Date End Date Taking? Authorizing Provider  albuterol (PROVENTIL HFA;VENTOLIN HFA) 108 (90 BASE) MCG/ACT inhaler Inhale 1 puff into the lungs every 6 (six) hours as needed for wheezing or shortness of breath.   Yes Historical Provider, MD  alendronate (FOSAMAX) 10 MG tablet Take 10 mg by mouth once a week. Take with a full glass of water on an empty stomach.  Sunday   Yes Historical Provider, MD  cholecalciferol (VITAMIN D) 1000 UNITS tablet Take 1,000 Units by mouth daily.   Yes Historical Provider, MD  cholestyramine Lucrezia Starch) 4 G packet Take 4 g by mouth daily.   Yes Historical Provider, MD  clopidogrel (PLAVIX) 75 MG tablet Take 75 mg by mouth daily.   Yes Historical Provider, MD  fluticasone (FLONASE) 50 MCG/ACT nasal spray Place 1 spray into both nostrils daily.   Yes Historical Provider, MD  furosemide (LASIX) 20 MG tablet Take 20 mg by mouth daily.   Yes Historical Provider, MD  ibuprofen (ADVIL,MOTRIN) 200 MG tablet Take 200 mg by mouth every 6 (six) hours as needed for moderate pain.   Yes Historical Provider, MD  loperamide (IMODIUM) 2 MG capsule Take 2 mg by  mouth as needed for diarrhea or loose stools.   Yes Historical Provider, MD  Multiple Vitamin (MULTIVITAMIN WITH MINERALS) TABS tablet Take 1 tablet by mouth daily.   Yes Historical Provider, MD  ondansetron (ZOFRAN) 8 MG tablet Take 8 mg by mouth every 8 (eight) hours as needed for nausea or vomiting.   Yes Historical Provider, MD  potassium chloride (K-DUR) 10 MEQ tablet Take 10 mEq by mouth daily.   Yes Historical Provider, MD   [place]   Yes Historical Provider, MD  prochlorperazine (COMPAZINE) 5 MG tablet Take 5 mg by mouth every 6 (six) hours as needed for nausea or vomiting.   Yes Historical Provider, MD   BP 165/79 mmHg  Pulse 93  Temp(Src) 99.9 F (37.7 C) (Oral)  Resp 22  Ht 5\' 6"  (1.676 m)  Wt 255 lb (115.667 kg)  BMI 41.18 kg/m2  SpO2 97% Physical Exam  Constitutional: She is oriented to person, place, and time. She appears well-developed and well-nourished. No distress.  HENT:  Nose: Nose normal.  Mouth/Throat: Oropharynx is clear and moist.  Eyes: Conjunctivae are normal. No scleral icterus.  Neck: Neck supple. No tracheal deviation present.  No stiffness or rigidity  Cardiovascular: Normal rate, regular rhythm, normal heart sounds and intact distal pulses.  Exam reveals no gallop and no friction rub.   No murmur heard. Pulmonary/Chest: Effort normal. No respiratory distress. She has rales.  Rhonchi/rales left lower lobe  Abdominal: Soft. Normal appearance and bowel sounds are normal. She exhibits no distension and no mass. There is no tenderness. There is no rebound and no guarding.  Genitourinary:  No cva tenderness  Musculoskeletal: She exhibits no edema or tenderness.  Neurological: She is alert and oriented to person, place, and time.  Skin: Skin is warm and dry. No rash noted. She is not diaphoretic.  Psychiatric: She has a normal mood and affect.  Nursing note and vitals reviewed.   ED Course  Procedures (including critical care time) Labs Review  Results for orders placed or performed during the hospital encounter of 09/19/14  CBC WITH DIFFERENTIAL  Result Value Ref Range   WBC 6.4 4.0 - 10.5 K/uL   RBC 2.98 (L) 3.87 - 5.11 MIL/uL   Hemoglobin 10.3 (L) 12.0 - 15.0 g/dL   HCT 28.3 (L) 36.0 - 46.0 %   MCV 95.0 78.0 - 100.0 fL   MCH 34.6 (H) 26.0 - 34.0 pg   MCHC 36.4 (H) 30.0 - 36.0 g/dL   RDW 21.3 (H) 11.5 - 15.5 %   Platelets 215 150 - 400 K/uL    Neutrophils Relative % 86 (H) 43 - 77 %   Lymphocytes Relative 7 (L) 12 - 46 %   Monocytes Relative 5 3 - 12 %   Eosinophils Relative 2 0 - 5 %   Basophils Relative 0 0 - 1 %   Neutro Abs 5.6 1.7 - 7.7 K/uL   Lymphs Abs 0.4 (L) 0.7 - 4.0 K/uL   Monocytes Absolute 0.3 0.1 - 1.0 K/uL   Eosinophils Absolute 0.1 0.0 - 0.7 K/uL   Basophils Absolute 0.0 0.0 - 0.1 K/uL   WBC Morphology TOXIC GRANULATION   Comprehensive metabolic panel  Result Value Ref Range   Sodium 135 135 - 145 mmol/L   Potassium 4.0 3.5 - 5.1 mmol/L   Chloride 100 96 - 112 mmol/L   CO2 25 19 - 32 mmol/L   Glucose, Bld 108 (H) 70 - 99 mg/dL  BUN 10 6 - 23 mg/dL   Creatinine, Ser 0.78 0.50 - 1.10 mg/dL   Calcium 9.3 8.4 - 10.5 mg/dL   Total Protein 7.9 6.0 - 8.3 g/dL   Albumin 3.6 3.5 - 5.2 g/dL   AST 22 0 - 37 U/L   ALT 18 0 - 35 U/L   Alkaline Phosphatase 79 39 - 117 U/L   Total Bilirubin 1.3 (H) 0.3 - 1.2 mg/dL   GFR calc non Af Amer 76 (L) >90 mL/min   GFR calc Af Amer 88 (L) >90 mL/min   Anion gap 10 5 - 15  Urinalysis with microscopic  Result Value Ref Range   Color, Urine YELLOW YELLOW   APPearance CLEAR CLEAR   Specific Gravity, Urine 1.010 1.005 - 1.030   pH 7.0 5.0 - 8.0   Glucose, UA NEGATIVE NEGATIVE mg/dL   Hgb urine dipstick NEGATIVE NEGATIVE   Bilirubin Urine NEGATIVE NEGATIVE   Ketones, ur NEGATIVE NEGATIVE mg/dL   Protein, ur NEGATIVE NEGATIVE mg/dL   Urobilinogen, UA 0.2 0.0 - 1.0 mg/dL   Nitrite NEGATIVE NEGATIVE   Leukocytes, UA NEGATIVE NEGATIVE  I-Stat CG4 Lactic Acid, ED  Result Value Ref Range   Lactic Acid, Venous 1.82 0.5 - 2.0 mmol/L   Dg Chest Port 1 View  09/19/2014   CLINICAL DATA:  Fever 24 hours  EXAM: PORTABLE CHEST - 1 VIEW  COMPARISON:  None.  FINDINGS: There is mild linear left base opacity which may be atelectatic. The lungs are otherwise clear. No large effusions are evident. Heart size is normal. Hilar and mediastinal contours are unremarkable.  IMPRESSION: Mild  linear left base atelectasis   Electronically Signed   By: Andreas Newport M.D.   On: 09/19/2014 22:15         EKG Interpretation   Date/Time:  Wednesday September 19 2014 21:36:01 EST Ventricular Rate:  94 PR Interval:    QRS Duration: 120 QT Interval:  352 QTC Calculation: 440 R Axis:   30 Text Interpretation:  Sinus rhythm Right bundle branch block No previous  tracing Confirmed by Ashok Cordia  MD, Lennette Bihari ([id number]) on 09/19/2014 9:42:10 PM      MDM   Iv ns bolus. Labs. Cultures.  Pt given empiric abx per suspected sepsis protocol.  Cxr. Ua.  Reviewed nursing notes and prior charts for additional history.   Pt with left lower lobe rales/rhonchi on exam. Suspect pna.  Hospitalists consulted for admission.  Recheck pt feels mildly improved.    Mirna Mires, MD 09/19/14 [phone]

## 2014-09-20 ENCOUNTER — Encounter (HOSPITAL_COMMUNITY): Payer: Self-pay | Admitting: Internal Medicine

## 2014-09-20 ENCOUNTER — Inpatient Hospital Stay (HOSPITAL_COMMUNITY): Payer: Medicare Other

## 2014-09-20 DIAGNOSIS — J189 Pneumonia, unspecified organism: Principal | ICD-10-CM

## 2014-09-20 DIAGNOSIS — R509 Fever, unspecified: Secondary | ICD-10-CM

## 2014-09-20 DIAGNOSIS — Z8673 Personal history of transient ischemic attack (TIA), and cerebral infarction without residual deficits: Secondary | ICD-10-CM

## 2014-09-20 DIAGNOSIS — Z8579 Personal history of other malignant neoplasms of lymphoid, hematopoietic and related tissues: Secondary | ICD-10-CM

## 2014-09-20 DIAGNOSIS — G4733 Obstructive sleep apnea (adult) (pediatric): Secondary | ICD-10-CM | POA: Diagnosis present

## 2014-09-20 DIAGNOSIS — R197 Diarrhea, unspecified: Secondary | ICD-10-CM

## 2014-09-20 LAB — COMPREHENSIVE METABOLIC PANEL
ALT: 16 U/L (ref 0–35)
AST: 15 U/L (ref 0–37)
Albumin: 3.2 g/dL — ABNORMAL LOW (ref 3.5–5.2)
Alkaline Phosphatase: 65 U/L (ref 39–117)
Anion gap: 7 (ref 5–15)
BUN: 10 mg/dL (ref 6–23)
CO2: 25 mmol/L (ref 19–32)
Calcium: 8.6 mg/dL (ref 8.4–10.5)
Chloride: 104 mmol/L (ref 96–112)
Creatinine, Ser: 0.77 mg/dL (ref 0.50–1.10)
GFR calc Af Amer: 89 mL/min — ABNORMAL LOW (ref >90)
GFR calc non Af Amer: 77 mL/min — ABNORMAL LOW (ref >90)
Glucose, Bld: 102 mg/dL — ABNORMAL HIGH (ref 70–99)
Potassium: 3.4 mmol/L — ABNORMAL LOW (ref 3.5–5.1)
Sodium: 136 mmol/L (ref 135–145)
Total Bilirubin: 1.3 mg/dL — ABNORMAL HIGH (ref 0.3–1.2)
Total Protein: 6.8 g/dL (ref 6.0–8.3)

## 2014-09-20 LAB — CBC WITH DIFFERENTIAL/PLATELET
Basophils Absolute: 0 10*3/uL (ref 0.0–0.1)
Basophils Relative: 0 % (ref 0–1)
Eosinophils Absolute: 0.1 10*3/uL (ref 0.0–0.7)
Eosinophils Relative: 2 % (ref 0–5)
HCT: 27.7 % — ABNORMAL LOW (ref 36.0–46.0)
Hemoglobin: 9.1 g/dL — ABNORMAL LOW (ref 12.0–15.0)
Lymphocytes Relative: 6 % — ABNORMAL LOW (ref 12–46)
Lymphs Abs: 0.4 10*3/uL — ABNORMAL LOW (ref 0.7–4.0)
MCH: 28.2 pg (ref 26.0–34.0)
MCHC: 32.9 g/dL (ref 30.0–36.0)
MCV: 85.8 fL (ref 78.0–100.0)
Monocytes Absolute: 0.5 10*3/uL (ref 0.1–1.0)
Monocytes Relative: 7 % (ref 3–12)
Neutro Abs: 5.6 10*3/uL (ref 1.7–7.7)
Neutrophils Relative %: 85 % — ABNORMAL HIGH (ref 43–77)
Platelets: 194 10*3/uL (ref 150–400)
RBC: 3.23 MIL/uL — ABNORMAL LOW (ref 3.87–5.11)
RDW: 16.7 % — ABNORMAL HIGH (ref 11.5–15.5)
WBC: 6.6 10*3/uL (ref 4.0–10.5)

## 2014-09-20 LAB — INFLUENZA PANEL BY PCR (TYPE A & B)
H1N1 flu by pcr: NOT DETECTED
Influenza A By PCR: NEGATIVE
Influenza B By PCR: NEGATIVE

## 2014-09-20 LAB — D-DIMER, QUANTITATIVE: D-Dimer, Quant: 0.86 ug/mL-FEU — ABNORMAL HIGH (ref 0.00–0.48)

## 2014-09-20 LAB — BRAIN NATRIURETIC PEPTIDE: B Natriuretic Peptide: 135.4 pg/mL — ABNORMAL HIGH (ref 0.0–100.0)

## 2014-09-20 LAB — CLOSTRIDIUM DIFFICILE BY PCR: Toxigenic C. Difficile by PCR: NEGATIVE

## 2014-09-20 LAB — TSH: TSH: 1.047 u[IU]/mL (ref 0.350–4.500)

## 2014-09-20 MED ORDER — ACETAMINOPHEN 325 MG PO TABS: 650.0000 mg | ORAL_TABLET | Freq: Four times a day (QID) | ORAL | Status: DC | PRN

## 2014-09-20 MED ORDER — IOHEXOL 350 MG/ML SOLN
100.0000 mL | Freq: Once | INTRAVENOUS | Status: AC | PRN
  Administered 2014-09-20: 100 mL via INTRAVENOUS

## 2014-09-20 MED ORDER — ALBUTEROL SULFATE (2.5 MG/3ML) 0.083% IN NEBU: 2.5000 mg | INHALATION_SOLUTION | Freq: Four times a day (QID) | RESPIRATORY_TRACT | Status: DC | PRN

## 2014-09-20 MED ORDER — LEVALBUTEROL HCL 1.25 MG/0.5ML IN NEBU
1.2500 mg | INHALATION_SOLUTION | Freq: Four times a day (QID) | RESPIRATORY_TRACT | Status: DC
  Administered 2014-09-20 – 2014-09-21 (×6): 1.25 mg via RESPIRATORY_TRACT
  Filled 2014-09-20 (×9): qty 0.5

## 2014-09-20 MED ORDER — CLOPIDOGREL BISULFATE 75 MG PO TABS
75.0000 mg | ORAL_TABLET | Freq: Every day | ORAL | Status: DC
Start: 2014-09-20 — End: 2014-09-22
  Administered 2014-09-20 – 2014-09-22 (×3): 75 mg via ORAL
  Filled 2014-09-20 (×4): qty 1

## 2014-09-20 MED ORDER — ONDANSETRON HCL 4 MG PO TABS: 4.0000 mg | ORAL_TABLET | Freq: Four times a day (QID) | ORAL | Status: DC | PRN

## 2014-09-20 MED ORDER — ENSURE COMPLETE PO LIQD
237.0000 mL | Freq: Two times a day (BID) | ORAL | Status: DC
  Administered 2014-09-20 – 2014-09-22 (×4): 237 mL via ORAL

## 2014-09-20 MED ORDER — METHYLPREDNISOLONE SODIUM SUCC 40 MG IJ SOLR
40.0000 mg | Freq: Two times a day (BID) | INTRAMUSCULAR | Status: DC
  Administered 2014-09-20 – 2014-09-21 (×3): 40 mg via INTRAVENOUS
  Filled 2014-09-20 (×4): qty 1

## 2014-09-20 MED ORDER — ONDANSETRON HCL 4 MG/2ML IJ SOLN: 4.0000 mg | Freq: Four times a day (QID) | INTRAMUSCULAR | Status: DC | PRN

## 2014-09-20 MED ORDER — PIPERACILLIN-TAZOBACTAM 3.375 G IVPB
3.3750 g | Freq: Three times a day (TID) | INTRAVENOUS | Status: DC
  Filled 2014-09-20: qty 50

## 2014-09-20 MED ORDER — SODIUM CHLORIDE 0.9 % IV SOLN
INTRAVENOUS | Status: DC
  Administered 2014-09-20: 75 mL/h via INTRAVENOUS

## 2014-09-20 MED ORDER — ACETAMINOPHEN 650 MG RE SUPP: 650.0000 mg | Freq: Four times a day (QID) | RECTAL | Status: DC | PRN

## 2014-09-20 MED ORDER — ENOXAPARIN SODIUM 40 MG/0.4ML ~~LOC~~ SOLN
40.0000 mg | SUBCUTANEOUS | Status: DC
  Administered 2014-09-20 – 2014-09-22 (×3): 40 mg via SUBCUTANEOUS
  Filled 2014-09-20 (×3): qty 0.4

## 2014-09-20 MED ORDER — VANCOMYCIN HCL IN DEXTROSE 1-5 GM/200ML-% IV SOLN
1000.0000 mg | Freq: Two times a day (BID) | INTRAVENOUS | Status: DC
  Administered 2014-09-20 – 2014-09-22 (×5): 1000 mg via INTRAVENOUS
  Filled 2014-09-20 (×6): qty 200

## 2014-09-20 MED ORDER — DEXTROSE 5 % IV SOLN
2.0000 g | Freq: Three times a day (TID) | INTRAVENOUS | Status: DC
  Administered 2014-09-20 – 2014-09-22 (×7): 2 g via INTRAVENOUS
  Filled 2014-09-20 (×8): qty 2

## 2014-09-20 MED ORDER — ALBUTEROL SULFATE HFA 108 (90 BASE) MCG/ACT IN AERS: 1.0000 | INHALATION_SPRAY | Freq: Four times a day (QID) | RESPIRATORY_TRACT | Status: DC | PRN

## 2014-09-20 MED ORDER — FLUTICASONE PROPIONATE 50 MCG/ACT NA SUSP
1.0000 | Freq: Every day | NASAL | Status: DC
  Administered 2014-09-21 – 2014-09-22 (×2): 1 via NASAL
  Filled 2014-09-20: qty 16

## 2014-09-20 MED ORDER — GUAIFENESIN ER 600 MG PO TB12
600.0000 mg | ORAL_TABLET | Freq: Two times a day (BID) | ORAL | Status: DC
  Administered 2014-09-20 – 2014-09-22 (×5): 600 mg via ORAL
  Filled 2014-09-20 (×6): qty 1

## 2014-09-20 MED ORDER — ALBUTEROL SULFATE (2.5 MG/3ML) 0.083% IN NEBU: 2.5000 mg | INHALATION_SOLUTION | RESPIRATORY_TRACT | Status: DC | PRN

## 2014-09-20 MED ORDER — POTASSIUM CHLORIDE CRYS ER 20 MEQ PO TBCR
40.0000 meq | EXTENDED_RELEASE_TABLET | Freq: Once | ORAL | Status: AC
  Administered 2014-09-20: 40 meq via ORAL
  Filled 2014-09-20: qty 2

## 2014-09-20 MED ORDER — CHOLESTYRAMINE 4 G PO PACK
4.0000 g | PACK | Freq: Every day | ORAL | Status: DC
  Administered 2014-09-20 – 2014-09-22 (×2): 4 g via ORAL
  Filled 2014-09-20 (×3): qty 1

## 2014-09-20 MED ORDER — GUAIFENESIN 100 MG/5ML PO SYRP
200.0000 mg | ORAL_SOLUTION | ORAL | Status: DC | PRN
  Filled 2014-09-20: qty 10

## 2014-09-20 NOTE — Progress Notes (Signed)
Patient ID: Natasha Ellis, female   DOB: [birth year], 81 y.o.   MRN: [id number]  [facility] PROGRESS NOTE  Natasha Ellis KGM:[id number] DOB: 01-21-1931 DOA: 09/19/2014 PCP: No primary care provider on file.   Brief narrative:    81 y.o. female with known history of lymphoma/Waldenstrm's macroglobulinemia, on chemotherapy, last chemotherapy was last week present to [facility] emergency department with main concern of progressively worsening fevers and chills, chronic cough productive of yellow phlegm, dyspnea on exertion that progressed to dyspnea at rest over the past 24 hours. In addition patient reports several days duration of nonbloody and watery diarrhea which patient explained is typical after her chemotherapy. She denied nausea and vomiting. In the emergency department T = 103F. patient admitted for further evaluation. Started on broad spectrum antibiotics vancomycin and Maxipime to cover for presumptive HCAP.  Assessment/Plan:    Principal Problem:   Acute respiratory failure - Unclear etiology but certainly concerning for developing pneumonia - CT chest angiogram negative for pulmonary emboli but confirming bilateral lower lobes infiltrates - T since arrival to the floor 99.9 F - Continue broad-spectrum antibiotics that were started in emergency department and narrow down as clinically indicated - follow up on respiratory viral panel, sputum culture, urine Legionella, strep pneumo - Provide bronchodilators scheduled and as needed, Mucinex 600 mg twice a day, Robitussin as needed - Placed on Solu-Medrol due to mild wheezing and taper down as clinically indicated Active Problems:   Hypokalemia - Mild, supplement and repeat BMP in the morning   OSA (obstructive sleep apnea) - Provide CPAP at nighttime    History of lymphoma - Will alert oncologist of patient's admission   Anemia of chronic disease, lymphoma - No signs of active bleeding, continue to monitor   Morbid obesity -  Body mass index is 41.18    DVT prophylaxis  - Lovenox subcutaneous     Code Status: Full.  Family Communication:  plan of care discussed with the patient Disposition Plan: Home when stable.   IV access:  Peripheral IV  Procedures and diagnostic studies:    Ct Angio Chest Pe W/cm &/or Wo Cm  09/20/2014   No evidence pulmonary emboli.  Bilateral lower lobe infiltrates are seen.     Dg Chest Port 1 View  09/19/2014    Mild linear left base atelectasis     Medical Consultants:  None   Other Consultants:  None   IAnti-Infectives:   Vancomycin 09/20/2014  Zosyn 09/20/2014  Faye Ramsay, MD  [facility] Pager [phone]  If 7PM-7AM, please contact night-coverage [email or web address] Password [email or web address] 09/20/2014, 11:10 AM   LOS: 1 day   HPI/Subjective: No events overnight.   Objective: Filed Vitals:   09/19/14 2355 09/20/14 0004 09/20/14 0145 09/20/14 0538  BP: 153/66 153/68 147/57 141/50  Pulse: 87 86 84 81  Temp: 99.3 F (37.4 C)  99.8 F (37.7 C) 99.2 F (37.3 C)  TempSrc: Oral Oral Oral Oral  Resp: 22 22 20 18   Height:      Weight:      SpO2: 94% 94% 96% 96%    Intake/Output Summary (Last 24 hours) at 09/20/14 1110 Last data filed at 09/20/14 0904  Gross per 24 hour  Intake  252.5 ml  Output    950 ml  Net -697.5 ml    Exam:   General:  Pt is alert, follows commands appropriately, not in acute distress  Cardiovascular: Regular rate and rhythm, S1/S2, no murmurs, no rubs, no gallops  Respiratory: Coarse breath  sounds bilaterally with rhonchi at bases, mild expiratory wheezing   Abdomen: Soft, non tender, non distended, bowel sounds present, no guarding  Extremities: pulses DP and PT palpable bilaterally  Neuro: Grossly nonfocal  Data Reviewed: Basic Metabolic Panel:  Recent Labs Lab 09/19/14 2152 09/20/14 0305  NA 135 136  K 4.0 3.4*  CL 100 104  CO2 25 25  GLUCOSE 108* 102*  BUN 10 10  CREATININE 0.78 0.77  CALCIUM 9.3 8.6   Liver Function  Tests:  Recent Labs Lab 09/19/14 2152 09/20/14 0305  AST 22 15  ALT 18 16  ALKPHOS 79 65  BILITOT 1.3* 1.3*  PROT 7.9 6.8  ALBUMIN 3.6 3.2*   CBC:  Recent Labs Lab 09/19/14 2152 09/20/14 0305  WBC 6.4 6.6  NEUTROABS 5.6 5.6  HGB 10.3* 9.1*  HCT 28.3* 27.7*  MCV 95.0 85.8  PLT 215 194   Recent Results (from the past 240 hour(s))  Culture, blood (routine x 2)     Status: None (Preliminary result)   Collection Time: 09/19/14  9:52 PM  Result Value Ref Range Status   Specimen Description BLOOD LEFT ARM  Final   Special Requests BOTTLES DRAWN AEROBIC AND ANAEROBIC 5CC  Final   Culture   Final           BLOOD CULTURE RECEIVED NO GROWTH TO DATE CULTURE WILL BE HELD FOR 5 DAYS BEFORE ISSUING A FINAL NEGATIVE REPORT Performed at [facility]    Report Status PENDING  Incomplete  Culture, blood (routine x 2)     Status: None (Preliminary result)   Collection Time: 09/19/14 10:20 PM  Result Value Ref Range Status   Specimen Description BLOOD RIGHT ANTECUBITAL  Final   Special Requests BOTTLES DRAWN AEROBIC AND ANAEROBIC 5ML  Final   Culture   Final           BLOOD CULTURE RECEIVED NO GROWTH TO DATE CULTURE WILL BE HELD FOR 5 DAYS BEFORE ISSUING A FINAL NEGATIVE REPORT Performed at [facility]    Report Status PENDING  Incomplete     Scheduled Meds: . ceFEPime (MAXIPIME) IV  2 g Intravenous 3 times per day  . cholestyramine  4 g Oral Daily  . clopidogrel  75 mg Oral Daily  . enoxaparin (LOVENOX) injection  40 mg Subcutaneous Q24H  . feeding supplement (ENSURE COMPLETE)  237 mL Oral BID BM  . fluticasone  1 spray Each Nare Daily  . vancomycin  1,000 mg Intravenous Q12H   Continuous Infusions: . sodium chloride 75 mL/hr (09/20/14 0350)

## 2014-09-20 NOTE — Progress Notes (Signed)
INITIAL NUTRITION ASSESSMENT  DOCUMENTATION CODES Per approved criteria  -Morbid Obesity   INTERVENTION: - Ensure Complete po BID, each supplement provides 350 kcal and 13 grams of protein - RD will continue to monitor  NUTRITION DIAGNOSIS: Inadequate oral intake related to lymphoma as evidenced by reported poor po intake.   Goal: Pt to meet >/= 90% of their estimated nutrition needs   Monitor:  Weight trend, po intake, acceptance of supplements, labs  Reason for Assessment: Malnutrition Screening Tool  81 y.o. female  Admitting Dx: Fever  ASSESSMENT: 81 y.o. female with known history of lymphoma/Waldenstrm's macroglobulinemia, on chemotherapy last chemotherapy was last week presents to the ER because of fever and chills.   - Pt with no recent weight loss.  - She reports a poor appetite for the past several days.  - Pt ate bites of scrambled eggs and bacon, a blueberry muffin and applesauce for breakfast this morning.  - Pt likes Ensure. Will send BID to increase po intake.  - Labs reviewed. K low - No signs of fat or muscle wasting  Height: Ht Readings from Last 1 Encounters:  09/19/14 5\' 6"  (1.676 m)    Weight: Wt Readings from Last 1 Encounters:  09/19/14 255 lb (115.667 kg)    Ideal Body Weight: 59.3 kg  % Ideal Body Weight: 195%  Wt Readings from Last 10 Encounters:  09/19/14 255 lb (115.667 kg)    BMI:  Body mass index is 41.18 kg/(m^2).  Estimated Nutritional Needs: Kcal: 1800-2000 Protein: 90-100 g Fluid: 1.8-2.0 L/day  Skin: Intact  Diet Order: Diet regular  EDUCATION NEEDS: -Education needs addressed   Intake/Output Summary (Last 24 hours) at 09/20/14 1237 Last data filed at 09/20/14 0904  Gross per 24 hour  Intake  252.5 ml  Output    950 ml  Net -697.5 ml    Last BM: 2/18   Labs:   Recent Labs Lab 09/19/14 2152 09/20/14 0305  NA 135 136  K 4.0 3.4*  CL 100 104  CO2 25 25  BUN 10 10  CREATININE 0.78 0.77  CALCIUM  9.3 8.6  GLUCOSE 108* 102*    CBG (last 3)  No results for input(s): GLUCAP in the last 72 hours.  Scheduled Meds: . ceFEPime (MAXIPIME) IV  2 g Intravenous 3 times per day  . cholestyramine  4 g Oral Daily  . clopidogrel  75 mg Oral Daily  . enoxaparin (LOVENOX) injection  40 mg Subcutaneous Q24H  . feeding supplement (ENSURE COMPLETE)  237 mL Oral BID BM  . fluticasone  1 spray Each Nare Daily  . guaiFENesin  600 mg Oral BID  . levalbuterol  1.25 mg Nebulization 4 times per day  . methylPREDNISolone (SOLU-MEDROL) injection  40 mg Intravenous Q12H  . vancomycin  1,000 mg Intravenous Q12H    Continuous Infusions: . sodium chloride 50 mL/hr at 09/20/14 1225    Past Medical History  Diagnosis Date  . Sleep apnea   . Cancer     Lymphoma    Past Surgical History  Procedure Laterality Date  . Abdominal hysterectomy      Laurette Schimke MS, RD, LDN

## 2014-09-20 NOTE — Progress Notes (Addendum)
ANTIBIOTIC CONSULT NOTE - INITIAL  Pharmacy Consult for Vancomycin and Zosyn(d/c), cefepime Indication: HCAP  Allergies  Allergen Reactions  . Lisinopril     unknown    Patient Measurements: Height: 5\' 6"  (167.6 cm) Weight: 255 lb (115.667 kg) IBW/kg (Calculated) : 59.3 Adjusted Body Weight:   Vital Signs: Temp: 99.8 F (37.7 C) (02/18 0145) Temp Source: Oral (02/18 0145) BP: 147/57 mmHg (02/18 0145) Pulse Rate: 84 (02/18 0145) Intake/Output from previous day:   Intake/Output from this shift:    Labs:  Recent Labs  09/19/14 2152  WBC 6.4  HGB 10.3*  PLT 215  CREATININE 0.78   Estimated Creatinine Clearance: 71.3 mL/min (by C-G formula based on Cr of 0.78). No results for input(s): VANCOTROUGH, VANCOPEAK, VANCORANDOM, GENTTROUGH, GENTPEAK, GENTRANDOM, TOBRATROUGH, TOBRAPEAK, TOBRARND, AMIKACINPEAK, AMIKACINTROU, AMIKACIN in the last 72 hours.   Microbiology: No results found for this or any previous visit (from the past 720 hour(s)).  Medical History: Past Medical History  Diagnosis Date  . Cancer     Lymphoma  . Sleep apnea     Medications:  Anti-infectives    Start     Dose/Rate Route Frequency Ordered Stop   09/20/14 0800  vancomycin (VANCOCIN) IVPB 1000 mg/200 mL premix     1,000 mg 200 mL/hr over 60 Minutes Intravenous Every 12 hours 09/20/14 0215     09/20/14 0600  piperacillin-tazobactam (ZOSYN) IVPB 3.375 g     3.375 g 12.5 mL/hr over 240 Minutes Intravenous 3 times per day 09/20/14 0215     09/19/14 2300  piperacillin-tazobactam (ZOSYN) IVPB 3.375 g     3.375 g 100 mL/hr over 30 Minutes Intravenous  Once 09/19/14 2247 09/20/14 0002   09/19/14 2300  vancomycin (VANCOCIN) IVPB 1000 mg/200 mL premix     1,000 mg 200 mL/hr over 60 Minutes Intravenous  Once 09/19/14 2247 09/20/14 0103     Assessment: Patient with fever and weakness.  Pharmacy to dose Vancomycin and Zosyn for HCAP.  First dose of antibiotics already given in ED.    Goal of  Therapy:  Vancomycin trough level 15-20 mcg/ml  Zosyn based on renal function  Cefepime dosed based on patient weight and renal function   Plan:  Measure antibiotic drug levels at steady state Follow up culture results Vancomycin 1gm iv q12hr  Zosyn 3.375g IV Q8H infused over 4hrs.   Tyler Deis, Shea Stakes [place] 09/20/2014,2:15 AM  D/c zosyn Add cefepime 2gm iv q8hr

## 2014-09-20 NOTE — H&P (Signed)
[facility] History and Physical  Natasha Ellis TIR:[id number] DOB: 05-31-1931 DOA: 09/19/2014  Referring physician: ER physician. PCP: No primary care provider on file.  Chief Complaint: Fever and chills.  HPI: Natasha Ellis is a 81 y.o. female with known history of lymphoma/Waldenstrm's macroglobulinemia, on chemotherapy last chemotherapy was last week presents to the ER because of fever and chills. Patient states that over the last 3 days patient had at least 3 episodes of fever and chills. Patient also has been having increasing shortness of breath from baseline with cough. Patient also has diarrhea which patient states is usually associated with her chemotherapy. Denies any nausea vomiting abdominal pain. Patient states that she has recorded fever up to 103F. UA was unremarkable and chest x-ray shows left-sided left atelectasis. Patient has been treated on empiric antibiotics and admitted for further management of her fever.   Review of Systems: As presented in the history of presenting illness, rest negative.  Past Medical History  Diagnosis Date  . Cancer     Lymphoma  . Sleep apnea    Past Surgical History  Procedure Laterality Date  . Abdominal hysterectomy     Social History:  reports that she has quit smoking. She has never used smokeless tobacco. She reports that she does not drink alcohol or use illicit drugs. Where does patient live home. Can patient participate in ADLs? Yes.  Allergies  Allergen Reactions  . Lisinopril     unknown    Family History:  Family History  Problem Relation Age of Onset  . Stroke Father       Prior to Admission medications   Medication Sig Start Date End Date Taking? Authorizing Provider  albuterol (PROVENTIL HFA;VENTOLIN HFA) 108 (90 BASE) MCG/ACT inhaler Inhale 1 puff into the lungs every 6 (six) hours as needed for wheezing or shortness of breath.   Yes Historical Provider, MD  alendronate (FOSAMAX) 10 MG tablet Take 10 mg by  mouth once a week. Take with a full glass of water on an empty stomach.  Sunday   Yes Historical Provider, MD  cholecalciferol (VITAMIN D) 1000 UNITS tablet Take 1,000 Units by mouth daily.   Yes Historical Provider, MD  cholestyramine Lucrezia Starch) 4 G packet Take 4 g by mouth daily.   Yes Historical Provider, MD  clopidogrel (PLAVIX) 75 MG tablet Take 75 mg by mouth daily.   Yes Historical Provider, MD  fluticasone (FLONASE) 50 MCG/ACT nasal spray Place 1 spray into both nostrils daily.   Yes Historical Provider, MD  furosemide (LASIX) 20 MG tablet Take 20 mg by mouth daily.   Yes Historical Provider, MD  ibuprofen (ADVIL,MOTRIN) 200 MG tablet Take 200 mg by mouth every 6 (six) hours as needed for moderate pain.   Yes Historical Provider, MD  loperamide (IMODIUM) 2 MG capsule Take 2 mg by mouth as needed for diarrhea or loose stools.   Yes Historical Provider, MD  Multiple Vitamin (MULTIVITAMIN WITH MINERALS) TABS tablet Take 1 tablet by mouth daily.   Yes Historical Provider, MD  ondansetron (ZOFRAN) 8 MG tablet Take 8 mg by mouth every 8 (eight) hours as needed for nausea or vomiting.   Yes Historical Provider, MD  potassium chloride (K-DUR) 10 MEQ tablet Take 10 mEq by mouth daily.   Yes Historical Provider, MD  [place]   Yes Historical Provider, MD  prochlorperazine (COMPAZINE) 5 MG tablet Take 5 mg by mouth every 6 (six) hours as needed for nausea or vomiting.  Yes Historical Provider, MD    Physical Exam: Filed Vitals:   09/19/14 2136 09/19/14 2255 09/19/14 2355 09/20/14 0004  BP: 165/79  153/66 153/68  Pulse: 93  87 86  Temp: 99.9 F (37.7 C)  99.3 F (37.4 C)   TempSrc: Oral  Oral Oral  Resp: 22  22 22   Height:  5\' 6"  (1.676 m)    Weight:  115.667 kg (255 lb)    SpO2: 97%  94% 94%     General:  Well-developed and nourished.  Eyes: Anicteric no pallor.  ENT: No discharge from the ears eyes nose and mouth.  Neck: No mass felt no neck  rigidity.  Cardiovascular: S1-S2 heard.  Respiratory: No rhonchi or crepitations.  Abdomen: Soft nontender bowel sounds present.  Skin: No rash.  Musculoskeletal: No edema.  Psychiatric: Appears normal.  Neurologic: Alert awake oriented to time place and person. Moves all extremities.  Labs on Admission:  Basic Metabolic Panel:  Recent Labs Lab 09/19/14 2152  NA 135  K 4.0  CL 100  CO2 25  GLUCOSE 108*  BUN 10  CREATININE 0.78  CALCIUM 9.3   Liver Function Tests:  Recent Labs Lab 09/19/14 2152  AST 22  ALT 18  ALKPHOS 79  BILITOT 1.3*  PROT 7.9  ALBUMIN 3.6   No results for input(s): LIPASE, AMYLASE in the last 168 hours. No results for input(s): AMMONIA in the last 168 hours. CBC:  Recent Labs Lab 09/19/14 2152  WBC 6.4  NEUTROABS 5.6  HGB 10.3*  HCT 28.3*  MCV 95.0  PLT 215   Cardiac Enzymes: No results for input(s): CKTOTAL, CKMB, CKMBINDEX, TROPONINI in the last 168 hours.  BNP (last 3 results) No results for input(s): BNP in the last 8760 hours.  ProBNP (last 3 results) No results for input(s): PROBNP in the last 8760 hours.  CBG: No results for input(s): GLUCAP in the last 168 hours.  Radiological Exams on Admission: Dg Chest Port 1 View  09/19/2014   CLINICAL DATA:  Fever 24 hours  EXAM: PORTABLE CHEST - 1 VIEW  COMPARISON:  None.  FINDINGS: There is mild linear left base opacity which may be atelectatic. The lungs are otherwise clear. No large effusions are evident. Heart size is normal. Hilar and mediastinal contours are unremarkable.  IMPRESSION: Mild linear left base atelectasis   Electronically Signed   By: Andreas Newport M.D.   On: 09/19/2014 22:15    EKG: Independently reviewed. Normal sinus rhythm with RBBB.  Assessment/Plan Principal Problem:   Fever Active Problems:   HCAP (healthcare-associated pneumonia)   OSA (obstructive sleep apnea)   History of lymphoma   History of TIA (transient ischemic  attack)   1. Fever and chills - source not clear. Patient does have some cough and shortness of breath so pneumonia is in the differentials. At this time blood cultures have been sent and patient has been placed on empiric vancomycin and cefepime. Check influenza PCR. 2. Shortness of breath - patient stated that she has been getting increasingly short of breath over the last few days more than her baseline. I have ordered a BNP and d-dimer. If d-dimer is elevated we will get CT abdomen of the chest to rule out PE. 3. OSA on C Pap per respiratory. 4. History of TIA on Plavix. 5. Chronic anemia - follow CBC. 6. Lymphoma on chemotherapy per oncologist.   DVT Prophylaxis Lovenox.  Code Status: Full code.  Family Communication: Patient's daughter at the bedside.  Disposition Plan: Admit to inpatient.    Raymir Frommelt N. [facility] Pager [phone].  If 7PM-7AM, please contact night-coverage [email or web address] Password [facility] 09/20/2014, 12:57 AM

## 2014-09-20 NOTE — Progress Notes (Signed)
Pt places herself on/off of CPAP QHS.  Pt is using her home equipment.  Machine is plugged into red outlet and Pt has a bottle of sterile water to fill the humidity chamber with (she requested that she fill it herself).

## 2014-09-21 LAB — CBC
HCT: 28.2 % — ABNORMAL LOW (ref 36.0–46.0)
Hemoglobin: 9.3 g/dL — ABNORMAL LOW (ref 12.0–15.0)
MCH: 29.5 pg (ref 26.0–34.0)
MCHC: 33 g/dL (ref 30.0–36.0)
MCV: 89.5 fL (ref 78.0–100.0)
Platelets: 207 10*3/uL (ref 150–400)
RBC: 3.15 MIL/uL — ABNORMAL LOW (ref 3.87–5.11)
RDW: 16.7 % — ABNORMAL HIGH (ref 11.5–15.5)
WBC: 4.8 10*3/uL (ref 4.0–10.5)

## 2014-09-21 LAB — URINE CULTURE: Colony Count: 25000

## 2014-09-21 LAB — BASIC METABOLIC PANEL
Anion gap: 8 (ref 5–15)
BUN: 12 mg/dL (ref 6–23)
CO2: 24 mmol/L (ref 19–32)
Calcium: 9.3 mg/dL (ref 8.4–10.5)
Chloride: 110 mmol/L (ref 96–112)
Creatinine, Ser: 0.67 mg/dL (ref 0.50–1.10)
GFR calc Af Amer: >90 mL/min (ref >90)
GFR calc non Af Amer: 80 mL/min — ABNORMAL LOW (ref >90)
Glucose, Bld: 149 mg/dL — ABNORMAL HIGH (ref 70–99)
Potassium: 4.2 mmol/L (ref 3.5–5.1)
Sodium: 142 mmol/L (ref 135–145)

## 2014-09-21 LAB — LEGIONELLA ANTIGEN, URINE

## 2014-09-21 LAB — STREP PNEUMONIAE URINARY ANTIGEN: Strep Pneumo Urinary Antigen: NEGATIVE

## 2014-09-21 LAB — EXPECTORATED SPUTUM ASSESSMENT W GRAM STAIN, RFLX TO RESP C

## 2014-09-21 MED ORDER — METHYLPREDNISOLONE SODIUM SUCC 40 MG IJ SOLR
40.0000 mg | Freq: Every day | INTRAMUSCULAR | Status: DC
  Administered 2014-09-22: 40 mg via INTRAVENOUS
  Filled 2014-09-21: qty 1

## 2014-09-21 NOTE — Progress Notes (Signed)
Patient ID: Natasha Ellis, female   DOB: 11/20/1930, 81 y.o.   MRN: [id number]  [facility] PROGRESS NOTE  Merla Sawka IRJ:[id number] DOB: 1931-07-01 DOA: 09/19/2014 PCP: No primary care provider on file.   Brief narrative:    81 y.o. female with known history of lymphoma/Waldenstrm's macroglobulinemia, on chemotherapy, last chemotherapy was last week present to [facility] emergency department with main concern of progressively worsening fevers and chills, chronic cough productive of yellow phlegm, dyspnea on exertion that progressed to dyspnea at rest over the past 24 hours. In addition patient reports several days duration of nonbloody and watery diarrhea which patient explained is typical after her chemotherapy. She denied nausea and vomiting. In the emergency department T = 103F. patient admitted for further evaluation. Started on broad spectrum antibiotics vancomycin and Maxipime to cover for presumptive HCAP.  Assessment/Plan:   Principal Problem:  Acute respiratory failure - secondary to PNA  - CT chest angiogram negative for pulmonary emboli but confirming bilateral lower lobes infiltrates - Continue broad-spectrum antibiotics that were started in emergency department and narrow down as clinically indicated - follow up on respiratory viral panel, sputum culture, urine Legionella, strep pneumo - Provide bronchodilators scheduled and as needed, Mucinex 600 mg twice a day, Robitussin as needed - taper down solumedrol  Active Problems:  Hypokalemia - supplemented and WNL this AM  OSA (obstructive sleep apnea) - Provide CPAP at nighttime   History of lymphoma - alerted oncologist of patient's admission  Anemia of chronic disease, lymphoma - No signs of active bleeding, continue to monitor  Morbid obesity - Body mass index is 41.18   DVT prophylaxis  - Lovenox subcutaneous   Code Status: Full.  Family Communication:  plan of care discussed with the  patient Disposition Plan: Home when stable.   IV access:  Peripheral IV  Procedures and diagnostic studies:    Ct Angio Chest Pe W/cm &/or Wo Cm 09/20/2014 No evidence pulmonary emboli. Bilateral lower lobe infiltrates are seen.   Dg Chest Port 1 View 09/19/2014 Mild linear left base atelectasis   Medical Consultants:  None   Other Consultants:  None   IAnti-Infectives:   Vancomycin 2/18 --> Zosyn 2/18 -->  Faye Ramsay, MD  [facility] Pager [phone]  If 7PM-7AM, please contact night-coverage [email or web address] Password [email or web address] 09/21/2014, 6:30 PM   LOS: 2 days   HPI/Subjective: No events overnight.   Objective: Filed Vitals:   09/20/14 2126 09/21/14 0529 09/21/14 0830 09/21/14 1415  BP: 130/58 161/63  136/56  Pulse: 78 78  78  Temp: 98 F (36.7 C) 97.5 F (36.4 C)  97.6 F (36.4 C)  TempSrc: Oral Oral  Oral  Resp: 18 18  16   Height:      Weight:      SpO2: 97% 98% 95% 98%    Intake/Output Summary (Last 24 hours) at 09/21/14 1830 Last data filed at 09/21/14 1826  Gross per 24 hour  Intake   1720 ml  Output   2301 ml  Net   -581 ml    Exam:   General:  Pt is alert, follows commands appropriately, not in acute distress  Cardiovascular: Regular rate and rhythm, S1/S2, no murmurs, no rubs, no gallops  Respiratory: Clear to auscultation bilaterally, no wheezing, no crackles, no rhonchi  Abdomen: Soft, non tender, non distended, bowel sounds present, no guarding  Extremities: No edema, pulses DP and PT palpable bilaterally  Neuro: Grossly nonfocal  Data Reviewed: Basic Metabolic Panel:  Recent Labs Lab 09/19/14  2152 09/20/14 0305 09/21/14 0510  NA 135 136 142  K 4.0 3.4* 4.2  CL 100 104 110  CO2 25 25 24   GLUCOSE 108* 102* 149*  BUN 10 10 12   CREATININE 0.78 0.77 0.67  CALCIUM 9.3 8.6 9.3   Liver Function Tests:  Recent Labs Lab 09/19/14 2152 09/20/14 0305  AST 22 15  ALT 18 16  ALKPHOS 79 65  BILITOT 1.3* 1.3*  PROT 7.9  6.8  ALBUMIN 3.6 3.2*   No results for input(s): LIPASE, AMYLASE in the last 168 hours. No results for input(s): AMMONIA in the last 168 hours. CBC:  Recent Labs Lab 09/19/14 2152 09/20/14 0305 09/21/14 0510  WBC 6.4 6.6 4.8  NEUTROABS 5.6 5.6  --   HGB 10.3* 9.1* 9.3*  HCT 28.3* 27.7* 28.2*  MCV 95.0 85.8 89.5  PLT 215 194 207   Cardiac Enzymes: No results for input(s): CKTOTAL, CKMB, CKMBINDEX, TROPONINI in the last 168 hours. BNP: Invalid input(s): POCBNP CBG: No results for input(s): GLUCAP in the last 168 hours.  Recent Results (from the past 240 hour(s))  Culture, blood (routine x 2)     Status: None (Preliminary result)   Collection Time: 09/19/14  9:52 PM  Result Value Ref Range Status   Specimen Description BLOOD LEFT ARM  Final   Special Requests BOTTLES DRAWN AEROBIC AND ANAEROBIC 5CC  Final   Culture   Final           BLOOD CULTURE RECEIVED NO GROWTH TO DATE CULTURE WILL BE HELD FOR 5 DAYS BEFORE ISSUING A FINAL NEGATIVE REPORT Performed at [facility]    Report Status PENDING  Incomplete  Urine culture     Status: None   Collection Time: 09/19/14 10:10 PM  Result Value Ref Range Status   Specimen Description URINE, CLEAN CATCH  Final   Special Requests NONE  Final   Colony Count   Final    25,000 COLONIES/ML Performed at [facility]    Culture   Final    Multiple bacterial morphotypes present, none predominant. Suggest appropriate recollection if clinically indicated. Performed at [facility]    Report Status 09/21/2014 FINAL  Final  Culture, blood (routine x 2)     Status: None (Preliminary result)   Collection Time: 09/19/14 10:20 PM  Result Value Ref Range Status   Specimen Description BLOOD RIGHT ANTECUBITAL  Final   Special Requests BOTTLES DRAWN AEROBIC AND ANAEROBIC 5ML  Final   Culture   Final           BLOOD CULTURE RECEIVED NO GROWTH TO DATE CULTURE WILL BE HELD FOR 5 DAYS BEFORE ISSUING A FINAL NEGATIVE  REPORT Performed at [facility]    Report Status PENDING  Incomplete  Clostridium Difficile by PCR     Status: None   Collection Time: 09/20/14  9:14 AM  Result Value Ref Range Status   C difficile by pcr NEGATIVE NEGATIVE Final    Comment: Performed at [facility]  Culture, expectorated sputum-assessment     Status: None   Collection Time: 09/21/14  1:10 PM  Result Value Ref Range Status   Specimen Description SPUTUM  Final   Special Requests NONE  Final   Sputum evaluation   Final    THIS SPECIMEN IS ACCEPTABLE. RESPIRATORY CULTURE REPORT TO FOLLOW.   Report Status 09/21/2014 FINAL  Final     Scheduled Meds: . ceFEPime (MAXIPIME) IV  2 g Intravenous 3 times  per day  . cholestyramine  4 g Oral Daily  . clopidogrel  75 mg Oral Daily  . enoxaparin (LOVENOX) injection  40 mg Subcutaneous Q24H  . feeding supplement (ENSURE COMPLETE)  237 mL Oral BID BM  . fluticasone  1 spray Each Nare Daily  . guaiFENesin  600 mg Oral BID  . levalbuterol  1.25 mg Nebulization 4 times per day  . methylPREDNISolone (SOLU-MEDROL) injection  40 mg Intravenous Q12H  . vancomycin  1,000 mg Intravenous Q12H   Continuous Infusions:

## 2014-09-21 NOTE — Progress Notes (Signed)
Pt places herself on and off of CPAP QHS.  Pt is using her home equipment.  Pt requested this night that I check the water level and fill it if needed.  Sterile water was added to the humidity chamber. Machine plugged into red outlet.  Pt informed to request RT should she need anything.

## 2014-09-22 LAB — BASIC METABOLIC PANEL
Anion gap: 6 (ref 5–15)
BUN: 18 mg/dL (ref 6–23)
CO2: 25 mmol/L (ref 19–32)
Calcium: 8.9 mg/dL (ref 8.4–10.5)
Chloride: 109 mmol/L (ref 96–112)
Creatinine, Ser: 0.73 mg/dL (ref 0.50–1.10)
GFR calc Af Amer: >90 mL/min (ref >90)
GFR calc non Af Amer: 78 mL/min — ABNORMAL LOW (ref >90)
Glucose, Bld: 95 mg/dL (ref 70–99)
Potassium: 3.9 mmol/L (ref 3.5–5.1)
Sodium: 140 mmol/L (ref 135–145)

## 2014-09-22 LAB — CBC
HCT: 26.1 % — ABNORMAL LOW (ref 36.0–46.0)
Hemoglobin: 8.6 g/dL — ABNORMAL LOW (ref 12.0–15.0)
MCH: 30 pg (ref 26.0–34.0)
MCHC: 33 g/dL (ref 30.0–36.0)
MCV: 90.9 fL (ref 78.0–100.0)
Platelets: 205 10*3/uL (ref 150–400)
RBC: 2.87 MIL/uL — ABNORMAL LOW (ref 3.87–5.11)
RDW: 16.8 % — ABNORMAL HIGH (ref 11.5–15.5)
WBC: 7 10*3/uL (ref 4.0–10.5)

## 2014-09-22 MED ORDER — LEVALBUTEROL HCL 1.25 MG/0.5ML IN NEBU
1.2500 mg | INHALATION_SOLUTION | Freq: Two times a day (BID) | RESPIRATORY_TRACT | Status: DC
  Administered 2014-09-22: 1.25 mg via RESPIRATORY_TRACT
  Filled 2014-09-22 (×3): qty 0.5

## 2014-09-22 MED ORDER — DEXTROSE 5 % IV SOLN
2.0000 g | Freq: Two times a day (BID) | INTRAVENOUS | Status: DC
  Filled 2014-09-22: qty 2

## 2014-09-22 MED ORDER — LEVOFLOXACIN 500 MG PO TABS: 500.0000 mg | ORAL_TABLET | Freq: Every day | ORAL | Status: DC

## 2014-09-22 MED ORDER — PREDNISONE 10 MG PO TABS: ORAL_TABLET | ORAL | Status: DC

## 2014-09-22 MED ORDER — GUAIFENESIN 100 MG/5ML PO SYRP: 200.0000 mg | ORAL_SOLUTION | ORAL | Status: DC | PRN

## 2014-09-22 MED ORDER — ALBUTEROL SULFATE (2.5 MG/3ML) 0.083% IN NEBU: 2.5000 mg | INHALATION_SOLUTION | RESPIRATORY_TRACT | Status: DC | PRN

## 2014-09-22 NOTE — Progress Notes (Signed)
Medicare Important Message given? YES  Date Medicare IM given:  09/22/2014 Medicare IM given by: Jonnie Finner

## 2014-09-22 NOTE — Progress Notes (Signed)
Assessment unchanged. Pt and daughter verbalized understanding of dc instruction through teach back. Scripts x 4 given as provided by MD. Nebulizer machine recently arrived for pt to have at home for neb tx's per MD order. Discharged via wc to front entrance to meet awaiting vehicle to carry home. Accompanied by NT and daughter.

## 2014-09-22 NOTE — Discharge Summary (Signed)
Physician Discharge Summary  Natasha Ellis KTG:[id number] DOB: 05-01-1931 DOA: 09/19/2014  PCP: No primary care provider on file.  Admit date: 09/19/2014 Discharge date: 09/22/2014  Recommendations for Outpatient Follow-up:  1. Pt will need to follow up with PCP in 2-3 weeks post discharge 2. Please obtain BMP to evaluate electrolytes and kidney function 3. Please also check CBC to evaluate Hg and Hct levels 4. levaquin for 7 days 5. Prednisone taper over the next 5 days   Discharge Diagnoses:  Principal Problem:   Fever Active Problems:   HCAP (healthcare-associated pneumonia)   OSA (obstructive sleep apnea)   History of lymphoma   History of TIA (transient ischemic attack)  Discharge Condition: Stable  Diet recommendation: Heart healthy diet discussed in details     Brief narrative:    81 y.o. female with known history of lymphoma/Waldenstrm's macroglobulinemia, on chemotherapy, last chemotherapy was last week present to [facility] emergency department with main concern of progressively worsening fevers and chills, chronic cough productive of yellow phlegm, dyspnea on exertion that progressed to dyspnea at rest over the past 24 hours. In addition patient reports several days duration of nonbloody and watery diarrhea which patient explained is typical after her chemotherapy. She denied nausea and vomiting. In the emergency department T = 103F. patient admitted for further evaluation. Started on broad spectrum antibiotics vancomycin and Maxipime to cover for presumptive HCAP.  Assessment/Plan:   Principal Problem:  Acute respiratory failure - secondary to PNA  - CT chest angiogram negative for pulmonary emboli but confirming bilateral lower lobes infiltrates - Continued broad-spectrum antibiotics that were started in emergency department and will narrow down upon discharge to Levaquin as pt wants to go home today  - Provide bronchodilators scheduled and as needed, Mucinex  600 mg twice a day, Robitussin as needed - also continue prednisone taper pack upon discharge  Active Problems:  Hypokalemia - supplemented  OSA (obstructive sleep apnea) - Provide CPAP at nighttime   History of lymphoma - alerted oncologist of patient's admission  Anemia of chronic disease, lymphoma - No signs of active bleeding, continue to monitor  Morbid obesity - Body mass index is 41.18   Code Status: Full.  Family Communication: plan of care discussed with the patient Disposition Plan: Home  IV access:  Peripheral IV  Procedures and diagnostic studies:   Ct Angio Chest Pe W/cm &/or Wo Cm 09/20/2014 No evidence pulmonary emboli. Bilateral lower lobe infiltrates are seen.   Dg Chest Port 1 View 09/19/2014 Mild linear left base atelectasis   Medical Consultants:  None   Other Consultants:  None   IAnti-Infectives:   Vancomycin 2/18 --> 2/20 Zosyn 2/18 --> 2/20 Levaquin upon discharge      Discharge Exam: Filed Vitals:   09/22/14 0840  BP: 158/73  Pulse: 71  Temp: 97.8 F (36.6 C)  Resp:    Filed Vitals:   09/21/14 2135 09/22/14 0412 09/22/14 0840 09/22/14 0906  BP: 156/72 152/72 158/73   Pulse: 79 67 71   Temp: 97.9 F (36.6 C) 97.8 F (36.6 C) 97.8 F (36.6 C)   TempSrc: Oral Oral Oral   Resp: 18 18    Height:      Weight:      SpO2: 97% 99% 98% 94%    General: Pt is alert, follows commands appropriately, not in acute distress Cardiovascular: Regular rate and rhythm,  no rubs, no gallops Respiratory: Clear to auscultation bilaterally, no wheezing, no crackles, no rhonchi Abdominal: Soft, non tender,  non distended, bowel sounds +, no guarding Extremities: no edema, no cyanosis, pulses palpable bilaterally DP and PT Neuro: Grossly nonfocal  Discharge Instructions  Discharge Instructions    Diet - low sodium heart healthy    Complete by:  As directed      Increase activity slowly    Complete by:  As  directed             Medication List    TAKE these medications        albuterol 108 (90 BASE) MCG/ACT inhaler  Commonly known as:  PROVENTIL HFA;VENTOLIN HFA  Inhale 1 puff into the lungs every 6 (six) hours as needed for wheezing or shortness of breath.     albuterol (2.5 MG/3ML) 0.083% nebulizer solution  Commonly known as:  PROVENTIL  Take 3 mLs (2.5 mg total) by nebulization every 4 (four) hours as needed for wheezing or shortness of breath.     alendronate 10 MG tablet  Commonly known as:  FOSAMAX  Take 10 mg by mouth once a week. Take with a full glass of water on an empty stomach.  Sunday     cholecalciferol 1000 UNITS tablet  Commonly known as:  VITAMIN D  Take 1,000 Units by mouth daily.     cholestyramine 4 G packet  Commonly known as:  QUESTRAN  Take 4 g by mouth daily.     clopidogrel 75 MG tablet  Commonly known as:  PLAVIX  Take 75 mg by mouth daily.     fluticasone 50 MCG/ACT nasal spray  Commonly known as:  FLONASE  Place 1 spray into both nostrils daily.     furosemide 20 MG tablet  Commonly known as:  LASIX  Take 20 mg by mouth daily.     guaifenesin 100 MG/5ML syrup  Commonly known as:  ROBITUSSIN  Take 10 mLs (200 mg total) by mouth every 4 (four) hours as needed for congestion.     ibuprofen 200 MG tablet  Commonly known as:  ADVIL,MOTRIN  Take 200 mg by mouth every 6 (six) hours as needed for moderate pain.     levofloxacin 500 MG tablet  Commonly known as:  LEVAQUIN  Take 1 tablet (500 mg total) by mouth daily.     loperamide 2 MG capsule  Commonly known as:  IMODIUM  Take 2 mg by mouth as needed for diarrhea or loose stools.     multivitamin with minerals Tabs tablet  Take 1 tablet by mouth daily.     ondansetron 8 MG tablet  Commonly known as:  ZOFRAN  Take 8 mg by mouth every 8 (eight) hours as needed for nausea or vomiting.     potassium chloride 10 MEQ tablet  Commonly known as:  K-DUR  Take 10 mEq by mouth daily.      predniSONE 10 MG tablet  Commonly known as:  DELTASONE  Take 50 mg tablet today and taper down by 10 mg daily until completed     PRESCRIPTION MEDICATION  [facility] CHEMO     prochlorperazine 5 MG tablet  Commonly known as:  COMPAZINE  Take 5 mg by mouth every 6 (six) hours as needed for nausea or vomiting.           Follow-up Information    Follow up with Mathews Argyle, MD.   Specialty:  Internal Medicine   Contact information:   [place] [employer] [address] [place] [address] [phone]       Follow  up with Faye Ramsay, MD.   Specialty:  Internal Medicine   Why:  As needed call my cell phone [phone]   Contact information:   [address] [place] [address] [phone]        The results of significant diagnostics from this hospitalization (including imaging, microbiology, ancillary and laboratory) are listed below for reference.     Microbiology: Recent Results (from the past 240 hour(s))  Culture, blood (routine x 2)     Status: None (Preliminary result)   Collection Time: 09/19/14  9:52 PM  Result Value Ref Range Status   Specimen Description BLOOD LEFT ARM  Final   Special Requests BOTTLES DRAWN AEROBIC AND ANAEROBIC 5CC  Final   Culture   Final           BLOOD CULTURE RECEIVED NO GROWTH TO DATE CULTURE WILL BE HELD FOR 5 DAYS BEFORE ISSUING A FINAL NEGATIVE REPORT Performed at [facility]    Report Status PENDING  Incomplete  Urine culture     Status: None   Collection Time: 09/19/14 10:10 PM  Result Value Ref Range Status   Specimen Description URINE, CLEAN CATCH  Final   Special Requests NONE  Final   Colony Count   Final    25,000 COLONIES/ML Performed at [facility]    Culture   Final    Multiple bacterial morphotypes present, none predominant. Suggest appropriate recollection if clinically indicated. Performed at [facility]    Report Status 09/21/2014 FINAL  Final  Culture,  blood (routine x 2)     Status: None (Preliminary result)   Collection Time: 09/19/14 10:20 PM  Result Value Ref Range Status   Specimen Description BLOOD RIGHT ANTECUBITAL  Final   Special Requests BOTTLES DRAWN AEROBIC AND ANAEROBIC 5ML  Final   Culture   Final           BLOOD CULTURE RECEIVED NO GROWTH TO DATE CULTURE WILL BE HELD FOR 5 DAYS BEFORE ISSUING A FINAL NEGATIVE REPORT Performed at [facility]    Report Status PENDING  Incomplete  Clostridium Difficile by PCR     Status: None   Collection Time: 09/20/14  9:14 AM  Result Value Ref Range Status   C difficile by pcr NEGATIVE NEGATIVE Final    Comment: Performed at [facility]  Culture, expectorated sputum-assessment     Status: None   Collection Time: 09/21/14  1:10 PM  Result Value Ref Range Status   Specimen Description SPUTUM  Final   Special Requests NONE  Final   Sputum evaluation   Final    THIS SPECIMEN IS ACCEPTABLE. RESPIRATORY CULTURE REPORT TO FOLLOW.   Report Status 09/21/2014 FINAL  Final  Culture, respiratory (NON-Expectorated)     Status: None (Preliminary result)   Collection Time: 09/21/14  1:32 PM  Result Value Ref Range Status   Specimen Description SPUTUM  Final   Special Requests NONE  Final   Gram Stain   Final    FEW WBC PRESENT,BOTH PMN AND MONONUCLEAR RARE SQUAMOUS EPITHELIAL CELLS PRESENT NO ORGANISMS SEEN Performed at [facility]    Culture PENDING  Incomplete   Report Status PENDING  Incomplete     Labs: Basic Metabolic Panel:  Recent Labs Lab 09/19/14 2152 09/20/14 0305 09/21/14 0510 09/22/14 0540  NA 135 136 142 140  K 4.0 3.4* 4.2 3.9  CL 100 104 110 109  CO2 25 25 24 25   GLUCOSE  108* 102* 149* 95  BUN 10 10 12 18   CREATININE 0.78 0.77 0.67 0.73  CALCIUM 9.3 8.6 9.3 8.9   Liver Function Tests:  Recent Labs Lab 09/19/14 2152 09/20/14 0305  AST 22 15  ALT 18 16  ALKPHOS 79 65  BILITOT 1.3* 1.3*  PROT 7.9 6.8  ALBUMIN 3.6 3.2*   No  results for input(s): LIPASE, AMYLASE in the last 168 hours. No results for input(s): AMMONIA in the last 168 hours. CBC:  Recent Labs Lab 09/19/14 2152 09/20/14 0305 09/21/14 0510 09/22/14 0540  WBC 6.4 6.6 4.8 7.0  NEUTROABS 5.6 5.6  --   --   HGB 10.3* 9.1* 9.3* 8.6*  HCT 28.3* 27.7* 28.2* 26.1*  MCV 95.0 85.8 89.5 90.9  PLT 215 194 207 205    BNP (last 3 results)  Recent Labs  09/20/14 0305  BNP 135.4*    SIGNED: Time coordinating discharge: Over 30 minutes  Faye Ramsay, MD  [facility] 09/22/2014, 12:01 PM Pager [phone]  If 7PM-7AM, please contact night-coverage [email or web address] Password [email or web address]

## 2014-09-22 NOTE — Progress Notes (Signed)
CARE MANAGEMENT NOTE 09/22/2014  Patient:  Natasha Ellis,Natasha Ellis   Account Number:  [id number]  Date Initiated:  09/20/2014  Documentation initiated by:  Sunday Spillers  Subjective/Objective Assessment:   81 yo female admitted with fever, questionable PNA. PTA lived at home with daughter.     Action/Plan:   Home when stable   Anticipated DC Date:  09/23/2014   Anticipated DC Plan:  [place]  CM consult      Choice offered to / List presented to:             Status of service:  Completed, signed off Medicare Important Message given?  YES (If response is "NO", the following Medicare IM given date fields will be blank) Date Medicare IM given:  09/22/2014 Medicare IM given by:  [facility] Date Additional Medicare IM given:   Additional Medicare IM given by:    Discharge Disposition:  HOME/SELF CARE  Per UR Regulation:  Reviewed for med. necessity/level of care/duration of stay  If discussed at [place] of Stay Meetings, dates discussed:    Comments:  09/22/2014 1305 NCM contacted [facility] for neb machine for home. NCM explained there maybe a delay in deliver of neb machine to room per DME rep. Faxed Rx to [facility] so they can be ready when pt goes to pick up from pharmacy. Jonnie Finner RN CCM Case Mgmt phone [phone]

## 2014-09-22 NOTE — Progress Notes (Signed)
ANTIBIOTIC CONSULT NOTE - FOLLOW UP  Pharmacy Consult for Vancomycin, Cefepime Indication: Pneumonia  Allergies  Allergen Reactions  . Lisinopril     unknown    Patient Measurements: Height: 5\' 6"  (167.6 cm) Weight: 255 lb (115.667 kg) IBW/kg (Calculated) : 59.3  Vital Signs: Temp: 97.8 F (36.6 C) (02/20 0840) Temp Source: Oral (02/20 0840) BP: 158/73 mmHg (02/20 0840) Pulse Rate: 71 (02/20 0840) Intake/Output from previous day: 02/19 0701 - 02/20 0700 In: 870 [P.O.:870] Out: 1351 [Urine:1350; Stool:1] Intake/Output from this shift: Total I/O In: 240 [P.O.:240] Out: 250 [Urine:250]  Labs:  Recent Labs  09/20/14 0305 09/21/14 0510 09/22/14 0540  WBC 6.6 4.8 7.0  HGB 9.1* 9.3* 8.6*  PLT 194 207 205  CREATININE 0.77 0.67 0.73   Estimated Creatinine Clearance: 71.3 mL/min (by C-G formula based on Cr of 0.73). No results for input(s): VANCOTROUGH, VANCOPEAK, VANCORANDOM, GENTTROUGH, GENTPEAK, GENTRANDOM, TOBRATROUGH, TOBRAPEAK, TOBRARND, AMIKACINPEAK, AMIKACINTROU, AMIKACIN in the last 72 hours.   Microbiology: Recent Results (from the past 720 hour(s))  Culture, blood (routine x 2)     Status: None (Preliminary result)   Collection Time: 09/19/14  9:52 PM  Result Value Ref Range Status   Specimen Description BLOOD LEFT ARM  Final   Special Requests BOTTLES DRAWN AEROBIC AND ANAEROBIC 5CC  Final   Culture   Final           BLOOD CULTURE RECEIVED NO GROWTH TO DATE CULTURE WILL BE HELD FOR 5 DAYS BEFORE ISSUING A FINAL NEGATIVE REPORT Performed at [facility]    Report Status PENDING  Incomplete  Urine culture     Status: None   Collection Time: 09/19/14 10:10 PM  Result Value Ref Range Status   Specimen Description URINE, CLEAN CATCH  Final   Special Requests NONE  Final   Colony Count   Final    25,000 COLONIES/ML Performed at [facility]    Culture   Final    Multiple bacterial morphotypes present, none predominant. Suggest  appropriate recollection if clinically indicated. Performed at [facility]    Report Status 09/21/2014 FINAL  Final  Culture, blood (routine x 2)     Status: None (Preliminary result)   Collection Time: 09/19/14 10:20 PM  Result Value Ref Range Status   Specimen Description BLOOD RIGHT ANTECUBITAL  Final   Special Requests BOTTLES DRAWN AEROBIC AND ANAEROBIC 5ML  Final   Culture   Final           BLOOD CULTURE RECEIVED NO GROWTH TO DATE CULTURE WILL BE HELD FOR 5 DAYS BEFORE ISSUING A FINAL NEGATIVE REPORT Performed at [facility]    Report Status PENDING  Incomplete  Clostridium Difficile by PCR     Status: None   Collection Time: 09/20/14  9:14 AM  Result Value Ref Range Status   C difficile by pcr NEGATIVE NEGATIVE Final    Comment: Performed at [facility]  Culture, expectorated sputum-assessment     Status: None   Collection Time: 09/21/14  1:10 PM  Result Value Ref Range Status   Specimen Description SPUTUM  Final   Special Requests NONE  Final   Sputum evaluation   Final    THIS SPECIMEN IS ACCEPTABLE. RESPIRATORY CULTURE REPORT TO FOLLOW.   Report Status 09/21/2014 FINAL  Final  Culture, respiratory (NON-Expectorated)     Status: None (Preliminary result)   Collection Time: 09/21/14  1:32 PM  Result Value Ref Range Status  Specimen Description SPUTUM  Final   Special Requests NONE  Final   Gram Stain   Final    FEW WBC PRESENT,BOTH PMN AND MONONUCLEAR RARE SQUAMOUS EPITHELIAL CELLS PRESENT NO ORGANISMS SEEN Performed at [facility]    Culture PENDING  Incomplete   Report Status PENDING  Incomplete    Assessment: 21 y/oF who [facility] for fever, generalized weakness, SOB, chills, cough, and diarrhea. Hx of lymphoma (last chemo was 2/16). Pharmacy has been consulted to dose Vancomycin and Cefepime for sepsis secondary to HCAP.   2/18>> Zosyn >> 2/18 2/18>> Vancomycin >> 2/18>> Cefepime >>  Tmax: Afebrile WBCs:  WNL at 7 Renal: SCr 0.73, CrCl ~71 CG,  62 N  2/17 blood x 2: NGTD 2/17 urine: multiple bacterial morphotypes present, none predominant 2/18 C. Diff PCR: negative 2/19 sputum: pending  Goal of Therapy:  Vancomycin trough level 15-20 mcg/ml  Appropriate antibiotic dosing for renal function and indication Eradication of infection  Plan:   Change Cefepime to 2g IV q12h, as patient not neutropenic.  Continue current Vancomycin regimen.  Check Vancomycin trough level prior to tonight's dose.  Follow-up renal function, cultures, clinical course, length of therapy.    Lindell Spar, PharmD, BCPS Pager: [phone] 09/22/2014 11:14 AM

## 2014-09-22 NOTE — Discharge Instructions (Signed)

## 2014-09-23 LAB — CULTURE, RESPIRATORY

## 2014-09-23 LAB — CULTURE, RESPIRATORY W GRAM STAIN: Culture: NORMAL

## 2014-09-25 ENCOUNTER — Other Ambulatory Visit (HOSPITAL_BASED_OUTPATIENT_CLINIC_OR_DEPARTMENT_OTHER): Payer: Medicare Other

## 2014-09-25 DIAGNOSIS — C8800 Waldenstrom macroglobulinemia not having achieved remission: Secondary | ICD-10-CM

## 2014-09-25 LAB — CBC WITH DIFFERENTIAL/PLATELET
BASO%: 0.6 % (ref 0.0–2.0)
Basophils Absolute: 0 10*3/uL (ref 0.0–0.1)
EOS%: 1.8 % (ref 0.0–7.0)
Eosinophils Absolute: 0.1 10*3/uL (ref 0.0–0.5)
HCT: 27.4 % — ABNORMAL LOW (ref 34.8–46.6)
HGB: 9.9 g/dL — ABNORMAL LOW (ref 11.6–15.9)
LYMPH%: 13.1 % — ABNORMAL LOW (ref 14.0–49.7)
MCH: 33.8 pg (ref 25.1–34.0)
MCHC: 36.1 g/dL — ABNORMAL HIGH (ref 31.5–36.0)
MCV: 93.5 fL (ref 79.5–101.0)
MONO#: 0.4 10*3/uL (ref 0.1–0.9)
MONO%: 12.2 % (ref 0.0–14.0)
NEUT#: 2.4 10*3/uL (ref 1.5–6.5)
NEUT%: 72.3 % (ref 38.4–76.8)
Platelets: 215 10*3/uL (ref 145–400)
RBC: 2.93 10*6/uL — ABNORMAL LOW (ref 3.70–5.45)
RDW: 19.6 % — ABNORMAL HIGH (ref 11.2–14.5)
WBC: 3.4 10*3/uL — ABNORMAL LOW (ref 3.9–10.3)
lymph#: 0.4 10*3/uL — ABNORMAL LOW (ref 0.9–3.3)

## 2014-09-25 LAB — CULTURE, BLOOD (ROUTINE X 2)
Culture: NO GROWTH
Culture: NO GROWTH

## 2014-09-25 LAB — COMPREHENSIVE METABOLIC PANEL (CC13)
ALT: 28 U/L (ref 0–55)
AST: 19 U/L (ref 5–34)
Albumin: 3.2 g/dL — ABNORMAL LOW (ref 3.5–5.0)
Alkaline Phosphatase: 66 U/L (ref 40–150)
Anion Gap: 9 mEq/L (ref 3–11)
BUN: 18.1 mg/dL (ref 7.0–26.0)
CO2: 27 mEq/L (ref 22–29)
Calcium: 9.6 mg/dL (ref 8.4–10.4)
Chloride: 108 mEq/L (ref 98–109)
Creatinine: 0.8 mg/dL (ref 0.6–1.1)
EGFR: 72 mL/min/{1.73_m2} — ABNORMAL LOW (ref >90)
Glucose: 95 mg/dl (ref 70–140)
Potassium: 3.6 mEq/L (ref 3.5–5.1)
Sodium: 145 mEq/L (ref 136–145)
Total Bilirubin: 0.54 mg/dL (ref 0.20–1.20)
Total Protein: 7 g/dL (ref 6.4–8.3)

## 2014-09-27 LAB — PROTEIN ELECTROPHORESIS, SERUM
Albumin ELP: 52.2 % — ABNORMAL LOW (ref 55.8–66.1)
Alpha-1-Globulin: 4.6 % (ref 2.9–4.9)
Alpha-2-Globulin: 10.5 % (ref 7.1–11.8)
Beta 2: 2.8 % — ABNORMAL LOW (ref 3.2–6.5)
Beta Globulin: 5 % (ref 4.7–7.2)
Gamma Globulin: 24.9 % — ABNORMAL HIGH (ref 11.1–18.8)
M-Spike, %: 1.55 g/dL
Total Protein, Serum Electrophoresis: 7.1 g/dL (ref 6.0–8.3)

## 2014-09-27 LAB — IGM: IgM, Serum: 2330 mg/dL — ABNORMAL HIGH (ref 52–322)

## 2014-10-01 ENCOUNTER — Other Ambulatory Visit: Payer: Medicare Other

## 2014-10-01 ENCOUNTER — Ambulatory Visit: Payer: Medicare Other | Admitting: Oncology

## 2014-10-01 ENCOUNTER — Telehealth: Payer: Self-pay | Admitting: *Deleted

## 2014-10-01 ENCOUNTER — Ambulatory Visit: Payer: Medicare Other

## 2014-10-01 NOTE — Telephone Encounter (Signed)
THIS NOTE SENT TO DR.SHERRILL, AMY Coleman, AND TANYA WHITLOCK,RN.

## 2014-10-02 ENCOUNTER — Ambulatory Visit: Payer: Medicare Other

## 2014-10-02 NOTE — Telephone Encounter (Signed)
Per Dr. Benay Spice; notified pt that [name] is better, coming down.  Pt verbalized understanding and confirmed appt for 10/08/14.

## 2014-10-05 ENCOUNTER — Other Ambulatory Visit: Payer: Self-pay | Admitting: Nurse Practitioner

## 2014-10-05 DIAGNOSIS — J189 Pneumonia, unspecified organism: Secondary | ICD-10-CM | POA: Diagnosis not present

## 2014-10-05 DIAGNOSIS — C880 Waldenstrom macroglobulinemia: Secondary | ICD-10-CM

## 2014-10-07 ENCOUNTER — Other Ambulatory Visit: Payer: Self-pay | Admitting: Oncology

## 2014-10-08 ENCOUNTER — Ambulatory Visit (HOSPITAL_BASED_OUTPATIENT_CLINIC_OR_DEPARTMENT_OTHER): Payer: Medicare Other | Admitting: Oncology

## 2014-10-08 ENCOUNTER — Telehealth: Payer: Self-pay | Admitting: Oncology

## 2014-10-08 ENCOUNTER — Other Ambulatory Visit (HOSPITAL_BASED_OUTPATIENT_CLINIC_OR_DEPARTMENT_OTHER): Payer: Medicare Other

## 2014-10-08 VITALS — BP 152/53 | HR 79 | Temp 98.3°F | Resp 19 | Ht 66.0 in | Wt 256.6 lb

## 2014-10-08 DIAGNOSIS — D649 Anemia, unspecified: Secondary | ICD-10-CM

## 2014-10-08 DIAGNOSIS — J189 Pneumonia, unspecified organism: Secondary | ICD-10-CM | POA: Diagnosis not present

## 2014-10-08 DIAGNOSIS — D701 Agranulocytosis secondary to cancer chemotherapy: Secondary | ICD-10-CM

## 2014-10-08 DIAGNOSIS — C880 Waldenstrom macroglobulinemia: Secondary | ICD-10-CM

## 2014-10-08 DIAGNOSIS — C8510 Unspecified B-cell lymphoma, unspecified site: Secondary | ICD-10-CM | POA: Diagnosis not present

## 2014-10-08 DIAGNOSIS — E875 Hyperkalemia: Secondary | ICD-10-CM | POA: Diagnosis not present

## 2014-10-08 LAB — CBC WITH DIFFERENTIAL/PLATELET
BASO%: 0.8 % (ref 0.0–2.0)
Basophils Absolute: 0 10*3/uL (ref 0.0–0.1)
EOS%: 4 % (ref 0.0–7.0)
Eosinophils Absolute: 0.1 10*3/uL (ref 0.0–0.5)
HCT: 30.6 % — ABNORMAL LOW (ref 34.8–46.6)
HGB: 9.9 g/dL — ABNORMAL LOW (ref 11.6–15.9)
LYMPH%: 21.4 % (ref 14.0–49.7)
MCH: 27.3 pg (ref 25.1–34.0)
MCHC: 32.2 g/dL (ref 31.5–36.0)
MCV: 84.7 fL (ref 79.5–101.0)
MONO#: 0.2 10*3/uL (ref 0.1–0.9)
MONO%: 10.6 % (ref 0.0–14.0)
NEUT#: 1.1 10*3/uL — ABNORMAL LOW (ref 1.5–6.5)
NEUT%: 63.2 % (ref 38.4–76.8)
Platelets: 185 10*3/uL (ref 145–400)
RBC: 3.62 10*6/uL — ABNORMAL LOW (ref 3.70–5.45)
RDW: 17.9 % — ABNORMAL HIGH (ref 11.2–14.5)
WBC: 1.7 10*3/uL — ABNORMAL LOW (ref 3.9–10.3)
lymph#: 0.4 10*3/uL — ABNORMAL LOW (ref 0.9–3.3)

## 2014-10-08 NOTE — Telephone Encounter (Signed)
gv adn printed appt sched anda vs for pt for March/april and May....sed added tx.

## 2014-10-08 NOTE — Progress Notes (Signed)
  [facility] OFFICE PROGRESS NOTE   Diagnosis: Waldenstrm's macroglobulinemia  INTERVAL HISTORY:   Natasha Ellis returns as scheduled. She completed cycle 4 bendamustine/rituximab beginning 09/10/2014. She reports malaise following chemotherapy. She was admitted with a fever" pneumonia "09/19/2014. These symptoms have resolved. Her overall energy level has improved since beginning the current chemotherapy regimen.  Objective:  Vital signs in last 24 hours:  Blood pressure 152/53, pulse 79, temperature 98.3 F (36.8 C), temperature source Oral, resp. rate 19, height 5\' 6"  (1.676 m), weight 256 lb 9.6 oz (116.393 kg), SpO2 97 %.    HEENT: No thrush or ulcers Lymphatics: No cervical or supra-clavicular nodes Resp: Scattered inspiratory rhonchi, no respiratory distress Cardio: Regular rhythm and rhythm GI: No hepatosplenomegaly, nontender Vascular: No leg edema     Lab Results:  Lab Results  Component Value Date   WBC 1.7* 10/08/2014   HGB 9.9* 10/08/2014   HCT 30.6* 10/08/2014   MCV 84.7 10/08/2014   PLT 185 10/08/2014   NEUTROABS 1.1* 10/08/2014   09/25/2014-serum M spike 1.55, [name] 2330  Medications: I have reviewed the patient's current medications.  Assessment/Plan: 1. 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 Treated with multiple systemic therapies includingpentostatin/Cytoxan/rituximab in 2010.   Cycle 1 bendamustine/Rituxan 05/31/2014.  Cycle 2 bendamustine/Rituxan 07/02/2014  Cycle 3 bendamustine/rituximab 07/30/2014  Cycle 4 bendamustine/rituximab 09/10/2014 2. Left knee arthritis. Followed by Dr. Wynelle Link. 3. Sleep apnea. 4. Anemia. Chronic. 5. Question left parotitis 12/12/2013 presenting with left facial and parotid edema. 6. Admission with pneumonia February  2016 7. Neutropenia secondary to chemotherapy   Disposition:  Natasha Ellis appears well. She has completed 4 cycles of bendamustine/rituximab. The serum [name] level and serum M spike continue to improve. Her overall performance status has improved since beginning chemotherapy.  We decided to hold chemotherapy this week secondary to the mild neutropenia. She would like to schedule the next cycle of bendamustine/rituximab for 10/22/2014. Her sister will be visiting for several weeks in April. The final planned cycle of chemotherapy will be scheduled for 12/03/2014. I will follow-up on records from the February hospital admission and ask medical records to merge her hospital charts.  [name], MD  10/08/2014  10:10 AM

## 2014-10-21 ENCOUNTER — Other Ambulatory Visit: Payer: Self-pay | Admitting: Oncology

## 2014-10-22 ENCOUNTER — Encounter: Payer: Self-pay | Admitting: Oncology

## 2014-10-22 ENCOUNTER — Ambulatory Visit (HOSPITAL_BASED_OUTPATIENT_CLINIC_OR_DEPARTMENT_OTHER): Payer: Medicare Other

## 2014-10-22 ENCOUNTER — Other Ambulatory Visit (HOSPITAL_BASED_OUTPATIENT_CLINIC_OR_DEPARTMENT_OTHER): Payer: Medicare Other

## 2014-10-22 DIAGNOSIS — Z5112 Encounter for antineoplastic immunotherapy: Secondary | ICD-10-CM | POA: Diagnosis not present

## 2014-10-22 DIAGNOSIS — C8510 Unspecified B-cell lymphoma, unspecified site: Secondary | ICD-10-CM

## 2014-10-22 DIAGNOSIS — Z5111 Encounter for antineoplastic chemotherapy: Secondary | ICD-10-CM

## 2014-10-22 DIAGNOSIS — C880 Waldenstrom macroglobulinemia: Secondary | ICD-10-CM

## 2014-10-22 LAB — CBC WITH DIFFERENTIAL/PLATELET
BASO%: 0.8 % (ref 0.0–2.0)
Basophils Absolute: 0 10*3/uL (ref 0.0–0.1)
EOS%: 3.1 % (ref 0.0–7.0)
Eosinophils Absolute: 0.1 10*3/uL (ref 0.0–0.5)
HCT: 29.8 % — ABNORMAL LOW (ref 34.8–46.6)
HGB: 9.9 g/dL — ABNORMAL LOW (ref 11.6–15.9)
LYMPH%: 27.2 % (ref 14.0–49.7)
MCH: 28.9 pg (ref 25.1–34.0)
MCHC: 33.3 g/dL (ref 31.5–36.0)
MCV: 86.8 fL (ref 79.5–101.0)
MONO#: 0.2 10*3/uL (ref 0.1–0.9)
MONO%: 9.5 % (ref 0.0–14.0)
NEUT#: 1.5 10*3/uL (ref 1.5–6.5)
NEUT%: 59.4 % (ref 38.4–76.8)
Platelets: 186 10*3/uL (ref 145–400)
RBC: 3.44 10*6/uL — ABNORMAL LOW (ref 3.70–5.45)
RDW: 17.5 % — ABNORMAL HIGH (ref 11.2–14.5)
WBC: 2.5 10*3/uL — ABNORMAL LOW (ref 3.9–10.3)
lymph#: 0.7 10*3/uL — ABNORMAL LOW (ref 0.9–3.3)

## 2014-10-22 MED ORDER — DIPHENHYDRAMINE HCL 25 MG PO CAPS
ORAL_CAPSULE | ORAL | Status: AC
  Filled 2014-10-22: qty 2

## 2014-10-22 MED ORDER — ACETAMINOPHEN 325 MG PO TABS
650.0000 mg | ORAL_TABLET | Freq: Once | ORAL | Status: AC
  Administered 2014-10-22: 650 mg via ORAL

## 2014-10-22 MED ORDER — ACETAMINOPHEN 325 MG PO TABS
ORAL_TABLET | ORAL | Status: AC
  Filled 2014-10-22: qty 2

## 2014-10-22 MED ORDER — FAMOTIDINE IN NACL 20-0.9 MG/50ML-% IV SOLN
20.0000 mg | Freq: Two times a day (BID) | INTRAVENOUS | Status: DC
  Administered 2014-10-22: 20 mg via INTRAVENOUS

## 2014-10-22 MED ORDER — SODIUM CHLORIDE 0.9 % IV SOLN
Freq: Once | INTRAVENOUS | Status: AC
  Administered 2014-10-22: 11:00:00 via INTRAVENOUS
  Filled 2014-10-22: qty 4

## 2014-10-22 MED ORDER — FAMOTIDINE IN NACL 20-0.9 MG/50ML-% IV SOLN
INTRAVENOUS | Status: AC
  Filled 2014-10-22: qty 50

## 2014-10-22 MED ORDER — DIPHENHYDRAMINE HCL 25 MG PO CAPS
50.0000 mg | ORAL_CAPSULE | Freq: Once | ORAL | Status: AC
  Administered 2014-10-22: 50 mg via ORAL

## 2014-10-22 MED ORDER — BENDAMUSTINE HCL CHEMO INJECTION 180 MG/2ML
70.0000 mg/m2 | Freq: Once | INTRAVENOUS | Status: AC
  Administered 2014-10-22: 162 mg via INTRAVENOUS
  Filled 2014-10-22: qty 1.8

## 2014-10-22 MED ORDER — SODIUM CHLORIDE 0.9 % IV SOLN
Freq: Once | INTRAVENOUS | Status: AC
  Administered 2014-10-22: 10:00:00 via INTRAVENOUS

## 2014-10-22 MED ORDER — SODIUM CHLORIDE 0.9 % IV SOLN
375.0000 mg/m2 | Freq: Once | INTRAVENOUS | Status: AC
  Administered 2014-10-22: 900 mg via INTRAVENOUS
  Filled 2014-10-22: qty 90

## 2014-10-22 NOTE — Patient Instructions (Signed)
[facility] Discharge Instructions for Patients Receiving Chemotherapy  Today you received the following chemotherapy agents: Rituxin and Treanda.  To help prevent nausea and vomiting after your treatment, we encourage you to take your nausea medication: Compazine 5 mg every 6 hours as needed. Zofran 8 mg every 8 hours as needed,   If you develop nausea and vomiting that is not controlled by your nausea medication, call the clinic.   BELOW ARE SYMPTOMS THAT SHOULD BE REPORTED IMMEDIATELY:  *FEVER GREATER THAN 100.5 F  *CHILLS WITH OR WITHOUT FEVER  NAUSEA AND VOMITING THAT IS NOT CONTROLLED WITH YOUR NAUSEA MEDICATION  *UNUSUAL SHORTNESS OF BREATH  *UNUSUAL BRUISING OR BLEEDING  TENDERNESS IN MOUTH AND THROAT WITH OR WITHOUT PRESENCE OF ULCERS  *URINARY PROBLEMS  *BOWEL PROBLEMS  UNUSUAL RASH Items with * indicate a potential emergency and should be followed up as soon as possible.  Feel free to call the clinic you have any questions or concerns. The clinic phone number is [phone].  Please show the [place] at check-in to the Emergency Department and triage nurse.

## 2014-10-22 NOTE — Progress Notes (Signed)
Noted that CMEt not obtained this am. Notified Dr. Benay Spice and he states that no cmet is needed prior to today's chemo. Pharmacy made aware.

## 2014-10-23 ENCOUNTER — Ambulatory Visit (HOSPITAL_BASED_OUTPATIENT_CLINIC_OR_DEPARTMENT_OTHER): Payer: Medicare Other

## 2014-10-23 DIAGNOSIS — C8330 Diffuse large B-cell lymphoma, unspecified site: Secondary | ICD-10-CM

## 2014-10-23 DIAGNOSIS — Z5111 Encounter for antineoplastic chemotherapy: Secondary | ICD-10-CM | POA: Diagnosis not present

## 2014-10-23 DIAGNOSIS — C880 Waldenstrom macroglobulinemia: Secondary | ICD-10-CM

## 2014-10-23 MED ORDER — SODIUM CHLORIDE 0.9 % IV SOLN
Freq: Once | INTRAVENOUS | Status: AC
  Administered 2014-10-23: 10:00:00 via INTRAVENOUS
  Filled 2014-10-23: qty 4

## 2014-10-23 MED ORDER — SODIUM CHLORIDE 0.9 % IV SOLN
Freq: Once | INTRAVENOUS | Status: AC
  Administered 2014-10-23: 10:00:00 via INTRAVENOUS

## 2014-10-23 MED ORDER — SODIUM CHLORIDE 0.9 % IV SOLN
70.0000 mg/m2 | Freq: Once | INTRAVENOUS | Status: AC
  Administered 2014-10-23: 162 mg via INTRAVENOUS
  Filled 2014-10-23: qty 1.8

## 2014-10-23 MED ORDER — DIPHENHYDRAMINE-APAP (SLEEP) 25-500 MG PO TABS: 1.0000 | ORAL_TABLET | Freq: Every evening | ORAL | Status: DC | PRN

## 2014-10-23 NOTE — Patient Instructions (Addendum)
Per Dr. Benay Spice try taking Benadryl or Tylenol PM tonight to help with sleep.  If this does not help call cancer center and let us know.   [facility] Discharge Instructions for Patients Receiving Chemotherapy  Today you received the following chemotherapy agents: Treanda.   To help prevent nausea and vomiting after your treatment, we encourage you to take your nausea medication as directed.    If you develop nausea and vomiting that is not controlled by your nausea medication, call the clinic.   BELOW ARE SYMPTOMS THAT SHOULD BE REPORTED IMMEDIATELY:  *FEVER GREATER THAN 100.5 F  *CHILLS WITH OR WITHOUT FEVER  NAUSEA AND VOMITING THAT IS NOT CONTROLLED WITH YOUR NAUSEA MEDICATION  *UNUSUAL SHORTNESS OF BREATH  *UNUSUAL BRUISING OR BLEEDING  TENDERNESS IN MOUTH AND THROAT WITH OR WITHOUT PRESENCE OF ULCERS  *URINARY PROBLEMS  *BOWEL PROBLEMS  UNUSUAL RASH Items with * indicate a potential emergency and should be followed up as soon as possible.  Feel free to call the clinic you have any questions or concerns. The clinic phone number is [phone].  Please show the [place] at check-in to the Emergency Department and triage nurse.

## 2014-10-23 NOTE — Progress Notes (Signed)
11:05 Pt reports mild burning sensation to IV site a few minutes ago. States burning sensation improved when she moved her hand.  Treanda infusion paused and IV site flushed with normal saline, good blood return noted, Pt denies any pain or burning at site.  Treanda infusion restarted. Pt denies and pain or burning at site at this time.  Instructed patient to notify RN if this occurs again.  Pt verbalized understanding.

## 2014-10-23 NOTE — Progress Notes (Signed)
Pt reported difficulty sleeping after treatments. Per Dr. Benay Spice: Try Tylenol PM or Benadryl for sleep. Call office if this is ineffective. Ria Comment, Infusion RN will review with pt.

## 2014-11-06 DIAGNOSIS — L570 Actinic keratosis: Secondary | ICD-10-CM | POA: Diagnosis not present

## 2014-11-06 DIAGNOSIS — L814 Other melanin hyperpigmentation: Secondary | ICD-10-CM | POA: Diagnosis not present

## 2014-11-06 DIAGNOSIS — L821 Other seborrheic keratosis: Secondary | ICD-10-CM | POA: Diagnosis not present

## 2014-11-06 DIAGNOSIS — D225 Melanocytic nevi of trunk: Secondary | ICD-10-CM | POA: Diagnosis not present

## 2014-11-06 DIAGNOSIS — D1801 Hemangioma of skin and subcutaneous tissue: Secondary | ICD-10-CM | POA: Diagnosis not present

## 2014-11-19 ENCOUNTER — Other Ambulatory Visit (HOSPITAL_BASED_OUTPATIENT_CLINIC_OR_DEPARTMENT_OTHER): Payer: Medicare Other

## 2014-11-19 DIAGNOSIS — C8330 Diffuse large B-cell lymphoma, unspecified site: Secondary | ICD-10-CM | POA: Diagnosis present

## 2014-11-19 DIAGNOSIS — C880 Waldenstrom macroglobulinemia: Secondary | ICD-10-CM | POA: Diagnosis not present

## 2014-11-19 LAB — COMPREHENSIVE METABOLIC PANEL (CC13)
ALT: 19 U/L (ref 0–55)
AST: 19 U/L (ref 5–34)
Albumin: 3.4 g/dL — ABNORMAL LOW (ref 3.5–5.0)
Alkaline Phosphatase: 70 U/L (ref 40–150)
Anion Gap: 10 mEq/L (ref 3–11)
BUN: 12.3 mg/dL (ref 7.0–26.0)
CO2: 23 mEq/L (ref 22–29)
Calcium: 9.1 mg/dL (ref 8.4–10.4)
Chloride: 110 mEq/L — ABNORMAL HIGH (ref 98–109)
Creatinine: 0.8 mg/dL (ref 0.6–1.1)
EGFR: 70 mL/min/{1.73_m2} — ABNORMAL LOW (ref >90)
Glucose: 88 mg/dl (ref 70–140)
Potassium: 3.9 mEq/L (ref 3.5–5.1)
Sodium: 143 mEq/L (ref 136–145)
Total Bilirubin: 0.82 mg/dL (ref 0.20–1.20)
Total Protein: 7.1 g/dL (ref 6.4–8.3)

## 2014-11-19 LAB — CBC WITH DIFFERENTIAL/PLATELET
BASO%: 0.4 % (ref 0.0–2.0)
Basophils Absolute: 0 10*3/uL (ref 0.0–0.1)
EOS%: 4 % (ref 0.0–7.0)
Eosinophils Absolute: 0.1 10*3/uL (ref 0.0–0.5)
HCT: 29.8 % — ABNORMAL LOW (ref 34.8–46.6)
HGB: 10.5 g/dL — ABNORMAL LOW (ref 11.6–15.9)
LYMPH%: 17.2 % (ref 14.0–49.7)
MCH: 32.6 pg (ref 25.1–34.0)
MCHC: 35.2 g/dL (ref 31.5–36.0)
MCV: 92.5 fL (ref 79.5–101.0)
MONO#: 0.3 10*3/uL (ref 0.1–0.9)
MONO%: 12.8 % (ref 0.0–14.0)
NEUT#: 1.6 10*3/uL (ref 1.5–6.5)
NEUT%: 65.6 % (ref 38.4–76.8)
Platelets: 178 10*3/uL (ref 145–400)
RBC: 3.22 10*6/uL — ABNORMAL LOW (ref 3.70–5.45)
RDW: 18.5 % — ABNORMAL HIGH (ref 11.2–14.5)
WBC: 2.5 10*3/uL — ABNORMAL LOW (ref 3.9–10.3)
lymph#: 0.4 10*3/uL — ABNORMAL LOW (ref 0.9–3.3)
nRBC: 0 % (ref 0–0)

## 2014-11-21 LAB — PROTEIN ELECTROPHORESIS, SERUM
Abnormal Protein Band1: 1.4 g/dL
Albumin ELP: 3.8 g/dL (ref 3.8–4.8)
Alpha-1-Globulin: 0.3 g/dL (ref 0.2–0.3)
Alpha-2-Globulin: 0.7 g/dL (ref 0.5–0.9)
Beta 2: 0.2 g/dL (ref 0.2–0.5)
Beta Globulin: 0.4 g/dL (ref 0.4–0.6)
Gamma Globulin: 1.6 g/dL (ref 0.8–1.7)
Total Protein, Serum Electrophoresis: 6.9 g/dL (ref 6.1–8.1)

## 2014-11-21 LAB — IGM: IgM, Serum: 2060 mg/dL — ABNORMAL HIGH (ref 52–322)

## 2014-12-02 ENCOUNTER — Other Ambulatory Visit: Payer: Self-pay | Admitting: Oncology

## 2014-12-03 ENCOUNTER — Telehealth: Payer: Self-pay | Admitting: Oncology

## 2014-12-03 ENCOUNTER — Other Ambulatory Visit (HOSPITAL_BASED_OUTPATIENT_CLINIC_OR_DEPARTMENT_OTHER): Payer: Medicare Other

## 2014-12-03 ENCOUNTER — Ambulatory Visit (HOSPITAL_BASED_OUTPATIENT_CLINIC_OR_DEPARTMENT_OTHER): Payer: Medicare Other | Admitting: Oncology

## 2014-12-03 ENCOUNTER — Ambulatory Visit: Payer: Medicare Other

## 2014-12-03 VITALS — BP 152/61 | HR 90 | Temp 98.2°F | Resp 18 | Ht 66.0 in | Wt 257.2 lb

## 2014-12-03 DIAGNOSIS — D649 Anemia, unspecified: Secondary | ICD-10-CM

## 2014-12-03 DIAGNOSIS — D701 Agranulocytosis secondary to cancer chemotherapy: Secondary | ICD-10-CM

## 2014-12-03 DIAGNOSIS — C8510 Unspecified B-cell lymphoma, unspecified site: Secondary | ICD-10-CM

## 2014-12-03 DIAGNOSIS — C8800 Waldenstrom macroglobulinemia not having achieved remission: Secondary | ICD-10-CM

## 2014-12-03 LAB — COMPREHENSIVE METABOLIC PANEL (CC13)
ALT: 16 U/L (ref 0–55)
AST: 15 U/L (ref 5–34)
Albumin: 3.4 g/dL — ABNORMAL LOW (ref 3.5–5.0)
Alkaline Phosphatase: 84 U/L (ref 40–150)
Anion Gap: 10 mEq/L (ref 3–11)
BUN: 11.9 mg/dL (ref 7.0–26.0)
CO2: 23 mEq/L (ref 22–29)
Calcium: 9.4 mg/dL (ref 8.4–10.4)
Chloride: 109 mEq/L (ref 98–109)
Creatinine: 0.8 mg/dL (ref 0.6–1.1)
EGFR: 70 mL/min/{1.73_m2} — ABNORMAL LOW (ref >90)
Glucose: 89 mg/dl (ref 70–140)
Potassium: 4.2 mEq/L (ref 3.5–5.1)
Sodium: 143 mEq/L (ref 136–145)
Total Bilirubin: 0.97 mg/dL (ref 0.20–1.20)
Total Protein: 7.3 g/dL (ref 6.4–8.3)

## 2014-12-03 LAB — CBC WITH DIFFERENTIAL/PLATELET
BASO%: 1.1 % (ref 0.0–2.0)
Basophils Absolute: 0 10*3/uL (ref 0.0–0.1)
EOS%: 5.6 % (ref 0.0–7.0)
Eosinophils Absolute: 0.1 10*3/uL (ref 0.0–0.5)
HCT: 28.6 % — ABNORMAL LOW (ref 34.8–46.6)
HGB: 10.3 g/dL — ABNORMAL LOW (ref 11.6–15.9)
LYMPH%: 18.2 % (ref 14.0–49.7)
MCH: 32.1 pg (ref 25.1–34.0)
MCHC: 36 g/dL (ref 31.5–36.0)
MCV: 89.3 fL (ref 79.5–101.0)
MONO#: 0.2 10*3/uL (ref 0.1–0.9)
MONO%: 12 % (ref 0.0–14.0)
NEUT#: 1.2 10*3/uL — ABNORMAL LOW (ref 1.5–6.5)
NEUT%: 63.1 % (ref 38.4–76.8)
Platelets: 224 10*3/uL (ref 145–400)
RBC: 3.2 10*6/uL — ABNORMAL LOW (ref 3.70–5.45)
RDW: 16.7 % — ABNORMAL HIGH (ref 11.2–14.5)
WBC: 1.8 10*3/uL — ABNORMAL LOW (ref 3.9–10.3)
lymph#: 0.3 10*3/uL — ABNORMAL LOW (ref 0.9–3.3)

## 2014-12-03 NOTE — Telephone Encounter (Signed)
Gave avs report and appointments for May and June.

## 2014-12-03 NOTE — Progress Notes (Signed)
  [place] OFFICE PROGRESS NOTE   Diagnosis: Non-Hodgkin's lymphoma  INTERVAL HISTORY:   Natasha Ellis returns as scheduled. She completed cycle 5 bendamustine/rituximab beginning 10/22/2014. She reports tolerating the chemotherapy well. She has malaise following chemotherapy. Natasha Ellis developed "chills "approximately one week ago and again on 11/30/2014. She had a fever of 100 and a productive cough on 11/30/2014. She began azithromycin. The fever and cough have resolved. No shortness of breath.    Objective:  Vital signs in last 24 hours:  Blood pressure 152/61, pulse 90, temperature 98.2 F (36.8 C), temperature source Oral, resp. rate 18, height 5\' 6"  (1.676 m), weight 257 lb 3.2 oz (116.665 kg), SpO2 96 %.    HEENT: No thrush or ulcers Lymphatics: No cervical or supraclavicular nodes, slight nodularity of the left submandibular gland Resp: Scattered inspiratory/expiratory wheezes, rhonchi at the left base, no respiratory distress Cardio: Regular rate and rhythm GI: No hepatosplenomegaly Vascular: No leg edema   Lab Results:  Lab Results  Component Value Date   WBC 1.8* 12/03/2014   HGB 10.3* 12/03/2014   HCT 28.6* 12/03/2014   MCV 89.3 12/03/2014   PLT 224 12/03/2014   NEUTROABS 1.2* 12/03/2014   11/19/2014-[name] 2060, serum M spike 1.4   Medications: I have reviewed the patient's current medications.  Assessment/Plan: 1. 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 Treated with multiple systemic therapies includingpentostatin/Cytoxan/rituximab in 2010.   Cycle 1 bendamustine/Rituxan 05/31/2014.  Cycle 2 bendamustine/Rituxan 07/02/2014  Cycle 3 bendamustine/rituximab 07/30/2014  Cycle 4 bendamustine/rituximab 09/10/2014  Cycle 5 bendamustine/rituximab  10/22/2014 2. Left knee arthritis. Followed by Dr. Wynelle Link. 3. Sleep apnea. 4. Anemia. Chronic. 5. Question left parotitis 12/12/2013 presenting with left facial and parotid edema. 6. Admission with pneumonia February 2016 7. Neutropenia secondary to chemotherapy   Disposition:  Natasha Ellis continues to tolerate the bendamustine/rituximab well. The [name] level and serum M spike are lower. Her overall performance status appears improved compared to pre-chemotherapy. She has mild neutropenia today. She appears to be recovering from an upper respiratory infection. I have a low clinical suspicion for pneumonia. He decided to delay the final planned cycle of bendamustine/rituximab for one week. She will return for a CBC prior to chemotherapy next week.  Natasha Ellis will be scheduled for an office visit and repeat [name] level in 6 weeks.  [name], MD  12/03/2014  9:42 AM

## 2014-12-04 ENCOUNTER — Ambulatory Visit: Payer: Medicare Other

## 2014-12-10 ENCOUNTER — Ambulatory Visit (HOSPITAL_BASED_OUTPATIENT_CLINIC_OR_DEPARTMENT_OTHER): Payer: Medicare Other

## 2014-12-10 ENCOUNTER — Other Ambulatory Visit (HOSPITAL_BASED_OUTPATIENT_CLINIC_OR_DEPARTMENT_OTHER): Payer: Medicare Other

## 2014-12-10 VITALS — BP 132/57 | HR 68 | Temp 97.9°F | Resp 18

## 2014-12-10 DIAGNOSIS — Z5112 Encounter for antineoplastic immunotherapy: Secondary | ICD-10-CM

## 2014-12-10 DIAGNOSIS — C880 Waldenstrom macroglobulinemia: Secondary | ICD-10-CM

## 2014-12-10 DIAGNOSIS — Z5111 Encounter for antineoplastic chemotherapy: Secondary | ICD-10-CM

## 2014-12-10 DIAGNOSIS — C8510 Unspecified B-cell lymphoma, unspecified site: Secondary | ICD-10-CM | POA: Diagnosis present

## 2014-12-10 LAB — CBC WITH DIFFERENTIAL/PLATELET
BASO%: 0.3 % (ref 0.0–2.0)
Basophils Absolute: 0 10*3/uL (ref 0.0–0.1)
EOS%: 4.2 % (ref 0.0–7.0)
Eosinophils Absolute: 0.1 10*3/uL (ref 0.0–0.5)
HCT: 27.4 % — ABNORMAL LOW (ref 34.8–46.6)
HGB: 9.7 g/dL — ABNORMAL LOW (ref 11.6–15.9)
LYMPH%: 19.3 % (ref 14.0–49.7)
MCH: 32.2 pg (ref 25.1–34.0)
MCHC: 35.4 g/dL (ref 31.5–36.0)
MCV: 91 fL (ref 79.5–101.0)
MONO#: 0.2 10*3/uL (ref 0.1–0.9)
MONO%: 7.7 % (ref 0.0–14.0)
NEUT#: 2.1 10*3/uL (ref 1.5–6.5)
NEUT%: 68.5 % (ref 38.4–76.8)
Platelets: 255 10*3/uL (ref 145–400)
RBC: 3.01 10*6/uL — ABNORMAL LOW (ref 3.70–5.45)
RDW: 16.3 % — ABNORMAL HIGH (ref 11.2–14.5)
WBC: 3.1 10*3/uL — ABNORMAL LOW (ref 3.9–10.3)
lymph#: 0.6 10*3/uL — ABNORMAL LOW (ref 0.9–3.3)
nRBC: 0 % (ref 0–0)

## 2014-12-10 MED ORDER — ACETAMINOPHEN 325 MG PO TABS
650.0000 mg | ORAL_TABLET | Freq: Once | ORAL | Status: AC
  Administered 2014-12-10: 650 mg via ORAL

## 2014-12-10 MED ORDER — SODIUM CHLORIDE 0.9 % IV SOLN
Freq: Once | INTRAVENOUS | Status: AC
  Administered 2014-12-10: 13:00:00 via INTRAVENOUS
  Filled 2014-12-10: qty 4

## 2014-12-10 MED ORDER — DIPHENHYDRAMINE HCL 25 MG PO CAPS
ORAL_CAPSULE | ORAL | Status: AC
  Filled 2014-12-10: qty 2

## 2014-12-10 MED ORDER — ACETAMINOPHEN 325 MG PO TABS
ORAL_TABLET | ORAL | Status: AC
  Filled 2014-12-10: qty 2

## 2014-12-10 MED ORDER — SODIUM CHLORIDE 0.9 % IV SOLN
Freq: Once | INTRAVENOUS | Status: AC
  Administered 2014-12-10: 13:00:00 via INTRAVENOUS

## 2014-12-10 MED ORDER — FAMOTIDINE IN NACL 20-0.9 MG/50ML-% IV SOLN
INTRAVENOUS | Status: AC
  Filled 2014-12-10: qty 50

## 2014-12-10 MED ORDER — DIPHENHYDRAMINE HCL 25 MG PO CAPS
50.0000 mg | ORAL_CAPSULE | Freq: Once | ORAL | Status: AC
  Administered 2014-12-10: 50 mg via ORAL

## 2014-12-10 MED ORDER — SODIUM CHLORIDE 0.9 % IV SOLN
375.0000 mg/m2 | Freq: Once | INTRAVENOUS | Status: AC
  Administered 2014-12-10: 900 mg via INTRAVENOUS
  Filled 2014-12-10: qty 90

## 2014-12-10 MED ORDER — BENDAMUSTINE HCL CHEMO INJECTION 180 MG/2ML
70.0000 mg/m2 | Freq: Once | INTRAVENOUS | Status: AC
  Administered 2014-12-10: 162 mg via INTRAVENOUS
  Filled 2014-12-10: qty 1.8

## 2014-12-10 MED ORDER — FAMOTIDINE IN NACL 20-0.9 MG/50ML-% IV SOLN
20.0000 mg | Freq: Two times a day (BID) | INTRAVENOUS | Status: DC
  Administered 2014-12-10: 20 mg via INTRAVENOUS

## 2014-12-10 NOTE — Patient Instructions (Signed)
[facility] Discharge Instructions for Patients Receiving Chemotherapy  Today you received the following chemotherapy agents Rituxan and Treanda.  To help prevent nausea and vomiting after your treatment, we encourage you to take your nausea medication as prescribed.   If you develop nausea and vomiting that is not controlled by your nausea medication, call the clinic.   BELOW ARE SYMPTOMS THAT SHOULD BE REPORTED IMMEDIATELY:  *FEVER GREATER THAN 100.5 F  *CHILLS WITH OR WITHOUT FEVER  NAUSEA AND VOMITING THAT IS NOT CONTROLLED WITH YOUR NAUSEA MEDICATION  *UNUSUAL SHORTNESS OF BREATH  *UNUSUAL BRUISING OR BLEEDING  TENDERNESS IN MOUTH AND THROAT WITH OR WITHOUT PRESENCE OF ULCERS  *URINARY PROBLEMS  *BOWEL PROBLEMS  UNUSUAL RASH Items with * indicate a potential emergency and should be followed up as soon as possible.  Feel free to call the clinic you have any questions or concerns. The clinic phone number is [phone].  Please show the [facility] at check-in to the Emergency Department and triage nurse.

## 2014-12-11 ENCOUNTER — Ambulatory Visit (HOSPITAL_BASED_OUTPATIENT_CLINIC_OR_DEPARTMENT_OTHER): Payer: Medicare Other

## 2014-12-11 VITALS — BP 120/59 | HR 62 | Temp 98.0°F | Resp 18

## 2014-12-11 DIAGNOSIS — Z5111 Encounter for antineoplastic chemotherapy: Secondary | ICD-10-CM | POA: Diagnosis present

## 2014-12-11 DIAGNOSIS — C880 Waldenstrom macroglobulinemia: Secondary | ICD-10-CM

## 2014-12-11 DIAGNOSIS — C8510 Unspecified B-cell lymphoma, unspecified site: Secondary | ICD-10-CM | POA: Diagnosis not present

## 2014-12-11 MED ORDER — SODIUM CHLORIDE 0.9 % IV SOLN
Freq: Once | INTRAVENOUS | Status: AC
  Administered 2014-12-11: 13:00:00 via INTRAVENOUS
  Filled 2014-12-11: qty 4

## 2014-12-11 MED ORDER — SODIUM CHLORIDE 0.9 % IV SOLN
Freq: Once | INTRAVENOUS | Status: AC
  Administered 2014-12-11: 12:00:00 via INTRAVENOUS

## 2014-12-11 MED ORDER — SODIUM CHLORIDE 0.9 % IV SOLN
70.0000 mg/m2 | Freq: Once | INTRAVENOUS | Status: AC
  Administered 2014-12-11: 162 mg via INTRAVENOUS
  Filled 2014-12-11: qty 1.8

## 2014-12-11 NOTE — Patient Instructions (Signed)
Natasha Ellis Discharge Instructions for Patients Receiving Chemotherapy  Today you received the following chemotherapy agents TREANDA To help prevent nausea and vomiting after your treatment, we encourage you to take your nausea medication as prescribed.   If you develop nausea and vomiting that is not controlled by your nausea medication, call the clinic.   BELOW ARE SYMPTOMS THAT SHOULD BE REPORTED IMMEDIATELY:  *FEVER GREATER THAN 100.5 F  *CHILLS WITH OR WITHOUT FEVER  NAUSEA AND VOMITING THAT IS NOT CONTROLLED WITH YOUR NAUSEA MEDICATION  *UNUSUAL SHORTNESS OF BREATH  *UNUSUAL BRUISING OR BLEEDING  TENDERNESS IN MOUTH AND THROAT WITH OR WITHOUT PRESENCE OF ULCERS  *URINARY PROBLEMS  *BOWEL PROBLEMS  UNUSUAL RASH Items with * indicate a potential emergency and should be followed up as soon as possible.  Feel free to call the clinic you have any questions or concerns. The clinic phone number is [phone].  Please show the [facility] at check-in to the Emergency Department and triage nurse.

## 2015-01-10 DIAGNOSIS — I10 Essential (primary) hypertension: Secondary | ICD-10-CM | POA: Diagnosis not present

## 2015-01-10 DIAGNOSIS — Z0000 Encounter for general adult medical examination without abnormal findings: Secondary | ICD-10-CM | POA: Diagnosis not present

## 2015-01-10 DIAGNOSIS — Z1389 Encounter for screening for other disorder: Secondary | ICD-10-CM | POA: Diagnosis not present

## 2015-01-10 DIAGNOSIS — M25562 Pain in left knee: Secondary | ICD-10-CM | POA: Diagnosis not present

## 2015-01-10 DIAGNOSIS — C8510 Unspecified B-cell lymphoma, unspecified site: Secondary | ICD-10-CM | POA: Diagnosis not present

## 2015-01-10 DIAGNOSIS — Z6841 Body Mass Index (BMI) 40.0 and over, adult: Secondary | ICD-10-CM | POA: Diagnosis not present

## 2015-01-17 ENCOUNTER — Other Ambulatory Visit (HOSPITAL_BASED_OUTPATIENT_CLINIC_OR_DEPARTMENT_OTHER): Payer: Medicare Other

## 2015-01-17 ENCOUNTER — Telehealth: Payer: Self-pay | Admitting: Oncology

## 2015-01-17 ENCOUNTER — Ambulatory Visit (HOSPITAL_BASED_OUTPATIENT_CLINIC_OR_DEPARTMENT_OTHER): Payer: Medicare Other | Admitting: Oncology

## 2015-01-17 VITALS — BP 157/98 | HR 80 | Temp 98.0°F | Resp 18 | Ht 66.0 in | Wt 257.6 lb

## 2015-01-17 DIAGNOSIS — C880 Waldenstrom macroglobulinemia: Secondary | ICD-10-CM

## 2015-01-17 DIAGNOSIS — D649 Anemia, unspecified: Secondary | ICD-10-CM | POA: Diagnosis not present

## 2015-01-17 DIAGNOSIS — D702 Other drug-induced agranulocytosis: Secondary | ICD-10-CM | POA: Diagnosis not present

## 2015-01-17 DIAGNOSIS — C8300 Small cell B-cell lymphoma, unspecified site: Secondary | ICD-10-CM

## 2015-01-17 LAB — CBC WITH DIFFERENTIAL/PLATELET
BASO%: 1 % (ref 0.0–2.0)
Basophils Absolute: 0 10*3/uL (ref 0.0–0.1)
EOS%: 4.2 % (ref 0.0–7.0)
Eosinophils Absolute: 0.1 10*3/uL (ref 0.0–0.5)
HCT: 29.4 % — ABNORMAL LOW (ref 34.8–46.6)
HGB: 9.8 g/dL — ABNORMAL LOW (ref 11.6–15.9)
LYMPH%: 25.2 % (ref 14.0–49.7)
MCH: 28.5 pg (ref 25.1–34.0)
MCHC: 33.3 g/dL (ref 31.5–36.0)
MCV: 85.4 fL (ref 79.5–101.0)
MONO#: 0.2 10*3/uL (ref 0.1–0.9)
MONO%: 10.4 % (ref 0.0–14.0)
NEUT#: 1 10*3/uL — ABNORMAL LOW (ref 1.5–6.5)
NEUT%: 59.2 % (ref 38.4–76.8)
Platelets: 228 10*3/uL (ref 145–400)
RBC: 3.45 10*6/uL — ABNORMAL LOW (ref 3.70–5.45)
RDW: 16.7 % — ABNORMAL HIGH (ref 11.2–14.5)
WBC: 1.7 10*3/uL — ABNORMAL LOW (ref 3.9–10.3)
lymph#: 0.4 10*3/uL — ABNORMAL LOW (ref 0.9–3.3)

## 2015-01-17 LAB — COMPREHENSIVE METABOLIC PANEL (CC13)
ALT: 18 U/L (ref 0–55)
AST: 20 U/L (ref 5–34)
Albumin: 3.3 g/dL — ABNORMAL LOW (ref 3.5–5.0)
Alkaline Phosphatase: 70 U/L (ref 40–150)
Anion Gap: 6 mEq/L (ref 3–11)
BUN: 12.3 mg/dL (ref 7.0–26.0)
CO2: 29 mEq/L (ref 22–29)
Calcium: 9.2 mg/dL (ref 8.4–10.4)
Chloride: 110 mEq/L — ABNORMAL HIGH (ref 98–109)
Creatinine: 0.8 mg/dL (ref 0.6–1.1)
EGFR: 68 mL/min/{1.73_m2} — ABNORMAL LOW (ref >90)
Glucose: 96 mg/dl (ref 70–140)
Potassium: 3.9 mEq/L (ref 3.5–5.1)
Sodium: 145 mEq/L (ref 136–145)
Total Bilirubin: 0.82 mg/dL (ref 0.20–1.20)
Total Protein: 6.9 g/dL (ref 6.4–8.3)

## 2015-01-17 NOTE — Progress Notes (Signed)
  [place] OFFICE PROGRESS NOTE   Diagnosis: Waldenstrm's macroglobulinemia  INTERVAL HISTORY:   Natasha Ellis returns as scheduled. She completed cycle 6 of bendamustine/rituximab beginning on 12/10/2014. She reports feeling well. She has port intention and plans to have a tooth removed later today.  Objective:  Vital signs in last 24 hours:  Blood pressure 157/98, pulse 80, temperature 98 F (36.7 C), temperature source Oral, resp. rate 18, height 5\' 6"  (1.676 m), weight 257 lb 9.6 oz (116.847 kg), SpO2 96 %. repeat manual blood pressure 146/66    HEENT: No thrush or ulcers Lymphatics: No cervical or supraclavicular nodes Resp: A few coarse rhonchi at the posterior bases, no respiratory distress Cardio: Regular rate and rhythm GI: No hepatosplenomegaly Vascular: No leg edema   Lab Results:  Lab Results  Component Value Date   WBC 1.7* 01/17/2015   HGB 9.8* 01/17/2015   HCT 29.4* 01/17/2015   MCV 85.4 01/17/2015   PLT 228 01/17/2015   NEUTROABS 1.0* 01/17/2015   11/19/2014-[name] 2060  Medications: I have reviewed the patient's current medications.  Assessment/Plan: 1. 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 Treated with multiple systemic therapies includingpentostatin/Cytoxan/rituximab in 2010.   Cycle 1 bendamustine/Rituxan 05/31/2014.  Cycle 2 bendamustine/Rituxan 07/02/2014  Cycle 3 bendamustine/rituximab 07/30/2014  Cycle 4 bendamustine/rituximab 09/10/2014  Cycle 5 bendamustine/rituximab 10/22/2014  Cycle 6 bendamustine/rituximab 12/10/2014 2. Left knee arthritis. Followed by Dr. Wynelle Link. 3. Sleep apnea. 4. Anemia. Chronic. 5. Question left parotitis 12/12/2013 presenting with left facial and parotid edema. 6. Admission with pneumonia February  2016 7. Neutropenia secondary to chemotherapy     Disposition:  She has completed 6 cycles of bendamustine/rituximab. Her clinical status has improved and the [name] is lower. We will follow-up on the [name] level today. Hopefully the anemia will improve over the next few months. She will return for a lab visit in one month and an office visit in 3 months.  She has mild neutropenia second to chemotherapy. Natasha Ellis will seek medical attention for a fever or symptoms of an infection.  [name], MD  01/17/2015  12:31 PM

## 2015-01-17 NOTE — Telephone Encounter (Signed)
Gave adn printed appt sched and avs for pt for July and Sept

## 2015-01-21 LAB — PROTEIN ELECTROPHORESIS, SERUM
Abnormal Protein Band1: 1.3 g/dL
Albumin ELP: 3.6 g/dL — ABNORMAL LOW (ref 3.8–4.8)
Alpha-1-Globulin: 0.3 g/dL (ref 0.2–0.3)
Alpha-2-Globulin: 0.7 g/dL (ref 0.5–0.9)
Beta 2: 0.2 g/dL (ref 0.2–0.5)
Beta Globulin: 0.4 g/dL (ref 0.4–0.6)
Gamma Globulin: 1.6 g/dL (ref 0.8–1.7)
Total Protein, Serum Electrophoresis: 6.8 g/dL (ref 6.1–8.1)

## 2015-01-21 LAB — IGM: IgM, Serum: 2260 mg/dL — ABNORMAL HIGH (ref 52–322)

## 2015-01-31 DIAGNOSIS — H906 Mixed conductive and sensorineural hearing loss, bilateral: Secondary | ICD-10-CM | POA: Diagnosis not present

## 2015-01-31 DIAGNOSIS — H7203 Central perforation of tympanic membrane, bilateral: Secondary | ICD-10-CM | POA: Diagnosis not present

## 2015-01-31 DIAGNOSIS — H60501 Unspecified acute noninfective otitis externa, right ear: Secondary | ICD-10-CM | POA: Diagnosis not present

## 2015-02-06 DIAGNOSIS — M1712 Unilateral primary osteoarthritis, left knee: Secondary | ICD-10-CM | POA: Diagnosis not present

## 2015-02-13 DIAGNOSIS — M1712 Unilateral primary osteoarthritis, left knee: Secondary | ICD-10-CM | POA: Diagnosis not present

## 2015-02-14 ENCOUNTER — Other Ambulatory Visit (HOSPITAL_BASED_OUTPATIENT_CLINIC_OR_DEPARTMENT_OTHER): Payer: Medicare Other

## 2015-02-14 DIAGNOSIS — C880 Waldenstrom macroglobulinemia: Secondary | ICD-10-CM

## 2015-02-14 LAB — CBC WITH DIFFERENTIAL/PLATELET
BASO%: 0.5 % (ref 0.0–2.0)
Basophils Absolute: 0 10*3/uL (ref 0.0–0.1)
EOS%: 3.5 % (ref 0.0–7.0)
Eosinophils Absolute: 0.1 10*3/uL (ref 0.0–0.5)
HCT: 30 % — ABNORMAL LOW (ref 34.8–46.6)
HGB: 10.2 g/dL — ABNORMAL LOW (ref 11.6–15.9)
LYMPH%: 16.7 % (ref 14.0–49.7)
MCH: 29.4 pg (ref 25.1–34.0)
MCHC: 34.1 g/dL (ref 31.5–36.0)
MCV: 86.1 fL (ref 79.5–101.0)
MONO#: 0.3 10*3/uL (ref 0.1–0.9)
MONO%: 10.2 % (ref 0.0–14.0)
NEUT#: 2 10*3/uL (ref 1.5–6.5)
NEUT%: 69.1 % (ref 38.4–76.8)
Platelets: 239 10*3/uL (ref 145–400)
RBC: 3.49 10*6/uL — ABNORMAL LOW (ref 3.70–5.45)
RDW: 17.1 % — ABNORMAL HIGH (ref 11.2–14.5)
WBC: 2.9 10*3/uL — ABNORMAL LOW (ref 3.9–10.3)
lymph#: 0.5 10*3/uL — ABNORMAL LOW (ref 0.9–3.3)

## 2015-02-15 LAB — IGM: IgM, Serum: 1990 mg/dL — ABNORMAL HIGH (ref 52–322)

## 2015-02-20 DIAGNOSIS — M1712 Unilateral primary osteoarthritis, left knee: Secondary | ICD-10-CM | POA: Diagnosis not present

## 2015-02-21 DIAGNOSIS — L0390 Cellulitis, unspecified: Secondary | ICD-10-CM | POA: Diagnosis not present

## 2015-02-26 DIAGNOSIS — R609 Edema, unspecified: Secondary | ICD-10-CM | POA: Diagnosis not present

## 2015-04-02 ENCOUNTER — Ambulatory Visit
Admission: RE | Admit: 2015-04-02 | Discharge: 2015-04-02 | Disposition: A | Discharge status: Home / Self Care | Payer: Medicare Other | Source: Ambulatory Visit | Attending: Geriatric Medicine | Admitting: Geriatric Medicine

## 2015-04-02 ENCOUNTER — Other Ambulatory Visit: Payer: Self-pay | Admitting: Geriatric Medicine

## 2015-04-02 DIAGNOSIS — J40 Bronchitis, not specified as acute or chronic: Secondary | ICD-10-CM

## 2015-04-02 DIAGNOSIS — J209 Acute bronchitis, unspecified: Secondary | ICD-10-CM | POA: Diagnosis not present

## 2015-04-02 DIAGNOSIS — R05 Cough: Secondary | ICD-10-CM | POA: Diagnosis not present

## 2015-04-04 ENCOUNTER — Other Ambulatory Visit: Payer: Self-pay | Admitting: *Deleted

## 2015-04-04 ENCOUNTER — Telehealth: Payer: Self-pay | Admitting: Oncology

## 2015-04-04 ENCOUNTER — Ambulatory Visit (HOSPITAL_BASED_OUTPATIENT_CLINIC_OR_DEPARTMENT_OTHER): Payer: Medicare Other | Admitting: Oncology

## 2015-04-04 ENCOUNTER — Other Ambulatory Visit (HOSPITAL_BASED_OUTPATIENT_CLINIC_OR_DEPARTMENT_OTHER): Payer: Medicare Other

## 2015-04-04 VITALS — BP 148/55 | HR 76 | Temp 98.4°F | Resp 19 | Ht 66.0 in | Wt 256.9 lb

## 2015-04-04 DIAGNOSIS — C880 Waldenstrom macroglobulinemia: Secondary | ICD-10-CM

## 2015-04-04 DIAGNOSIS — L03116 Cellulitis of left lower limb: Secondary | ICD-10-CM

## 2015-04-04 DIAGNOSIS — C8510 Unspecified B-cell lymphoma, unspecified site: Secondary | ICD-10-CM | POA: Diagnosis not present

## 2015-04-04 DIAGNOSIS — J189 Pneumonia, unspecified organism: Secondary | ICD-10-CM

## 2015-04-04 LAB — CBC WITH DIFFERENTIAL/PLATELET
BASO%: 0.3 % (ref 0.0–2.0)
Basophils Absolute: 0 10*3/uL (ref 0.0–0.1)
EOS%: 4.3 % (ref 0.0–7.0)
Eosinophils Absolute: 0.1 10*3/uL (ref 0.0–0.5)
HCT: 27.8 % — ABNORMAL LOW (ref 34.8–46.6)
HGB: 9.4 g/dL — ABNORMAL LOW (ref 11.6–15.9)
LYMPH%: 25 % (ref 14.0–49.7)
MCH: 30.4 pg (ref 25.1–34.0)
MCHC: 33.8 g/dL (ref 31.5–36.0)
MCV: 90 fL (ref 79.5–101.0)
MONO#: 0.4 10*3/uL (ref 0.1–0.9)
MONO%: 11.1 % (ref 0.0–14.0)
NEUT#: 1.9 10*3/uL (ref 1.5–6.5)
NEUT%: 59.3 % (ref 38.4–76.8)
Platelets: 244 10*3/uL (ref 145–400)
RBC: 3.09 10*6/uL — ABNORMAL LOW (ref 3.70–5.45)
RDW: 15.8 % — ABNORMAL HIGH (ref 11.2–14.5)
WBC: 3.2 10*3/uL — ABNORMAL LOW (ref 3.9–10.3)
lymph#: 0.8 10*3/uL — ABNORMAL LOW (ref 0.9–3.3)
nRBC: 0 % (ref 0–0)

## 2015-04-04 LAB — COMPREHENSIVE METABOLIC PANEL (CC13)
ALT: 9 U/L (ref 0–55)
AST: 13 U/L (ref 5–34)
Albumin: 3.2 g/dL — ABNORMAL LOW (ref 3.5–5.0)
Alkaline Phosphatase: 74 U/L (ref 40–150)
Anion Gap: 8 mEq/L (ref 3–11)
BUN: 13.7 mg/dL (ref 7.0–26.0)
CO2: 29 mEq/L (ref 22–29)
Calcium: 9.9 mg/dL (ref 8.4–10.4)
Chloride: 108 mEq/L (ref 98–109)
Creatinine: 1 mg/dL (ref 0.6–1.1)
EGFR: 55 mL/min/{1.73_m2} — ABNORMAL LOW (ref >90)
Glucose: 98 mg/dl (ref 70–140)
Potassium: 4.3 mEq/L (ref 3.5–5.1)
Sodium: 145 mEq/L (ref 136–145)
Total Bilirubin: 0.5 mg/dL (ref 0.20–1.20)
Total Protein: 7.1 g/dL (ref 6.4–8.3)

## 2015-04-04 NOTE — Telephone Encounter (Signed)
Gave adn printed appt sched and avs for pt for OCT °

## 2015-04-04 NOTE — Progress Notes (Signed)
  [place] OFFICE PROGRESS NOTE   Diagnosis: Waldenstrm's macroglobulinemia  INTERVAL HISTORY:   [name] returns as scheduled. She reports multiple recent infections including a "ear infection ", left leg cellulitis, and a tooth infection. For several weeks she had intermittent low-grade fever and malaise. She saw Dr. Felipa Eth and was diagnosed with pneumonia 04/02/2015. She is taking Levaquin. She reports no significant dyspnea.  Objective:  Vital signs in last 24 hours:  Blood pressure 148/55, pulse 76, temperature 98.4 F (36.9 C), temperature source Oral, resp. rate 19, height 5\' 6"  (1.676 m), weight 256 lb 14.4 oz (116.529 kg), SpO2 98 %.    HEENT: No thrush Resp: Bilateral expiratory rhonchi, no respiratory distress Cardio: Regular rate and rhythm GI: No hepatosplenomegaly, nontender Vascular: No leg edema   Lab Results:  Lab Results  Component Value Date   WBC 3.2* 04/04/2015   HGB 9.4* 04/04/2015   HCT 27.8* 04/04/2015   MCV 90.0 04/04/2015   PLT 244 04/04/2015   NEUTROABS 1.9 04/04/2015   potassium 4.3, creatinine 1.0, total protein 7.1, albumin 3.2  02/14/2015-[name] 1990   Imaging:  Dg Chest 2 View  04/02/2015   CLINICAL DATA:  Bronchitis with fever and cough for 3 weeks. History of asthma, hypertension and lymphoma. Initial encounter.  EXAM: CHEST  2 VIEW  COMPARISON:  12/24/2013 and 04/06/2013.  FINDINGS: The heart size and mediastinal contours are stable. There is a small hiatal hernia and mild aortic atherosclerosis. There is a new nodular density overlapping the cardiac apex on the frontal examination, likely airspace disease within the left lower lobe. The right lung is clear. There is no pleural effusion. The bones appear unremarkable.  IMPRESSION: Left lower lobe nodularity most likely representing pneumonia in this clinical context. Followup PA and lateral chest X-ray is recommended in 3-4 weeks following trial of antibiotic therapy to  ensure resolution and exclude underlying malignancy.   Electronically Signed   By: Richardean Sale M.D.   On: 04/02/2015 16:11    Medications: I have reviewed the patient's current medications.  Assessment/Plan: 1. 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 Treated with multiple systemic therapies includingpentostatin/Cytoxan/rituximab in 2010.   Cycle 1 bendamustine/Rituxan 05/31/2014.  Cycle 2 bendamustine/Rituxan 07/02/2014  Cycle 3 bendamustine/rituximab 07/30/2014  Cycle 4 bendamustine/rituximab 09/10/2014  Cycle 5 bendamustine/rituximab 10/22/2014  Cycle 6 bendamustine/rituximab 12/10/2014 2. Left knee arthritis. Followed by Dr. Wynelle Link. 3. Sleep apnea. 4. Anemia. Chronic. 5. Question left parotitis 12/12/2013 presenting with left facial and parotid edema. 6. Admission with pneumonia February 2016 7. History of Neutropenia secondary to chemotherapy 8. Left lung pneumonia 04/02/2015   Disposition:  Ms. [name] has been diagnosed with pneumonia. She has a history of recurrent infections, likely secondary to the underlying non-Hodgkin's lymphoma, treatment with rituximab within the past year, and COPD. We will check quantitative imaging level levels.  She continues to have anemia secondary to chronic disease and Waldenstrm's macroglobulinemia. We will consider resuming treatment for the Waldenstrm's if the anemia progresses.  She will return for an office and lab visit in 6 weeks. Ms. [name] reports being up-to-date on the pneumonia vaccines. She will obtain an influenza vaccine via Dr. Felipa Eth.  [name], MD  04/04/2015  12:50 PM

## 2015-04-09 ENCOUNTER — Telehealth: Payer: Self-pay | Admitting: *Deleted

## 2015-04-09 LAB — PROTEIN ELECTROPHORESIS, SERUM
Abnormal Protein Band1: 1.1 g/dL
Albumin ELP: 3.4 g/dL — ABNORMAL LOW (ref 3.8–4.8)
Alpha-1-Globulin: 0.5 g/dL — ABNORMAL HIGH (ref 0.2–0.3)
Alpha-2-Globulin: 1 g/dL — ABNORMAL HIGH (ref 0.5–0.9)
Beta 2: 0.2 g/dL (ref 0.2–0.5)
Beta Globulin: 0.4 g/dL (ref 0.4–0.6)
Gamma Globulin: 1.3 g/dL (ref 0.8–1.7)
Total Protein, Serum Electrophoresis: 6.7 g/dL (ref 6.1–8.1)

## 2015-04-09 LAB — IGG, IGA, IGM
IgA: 8 mg/dL — ABNORMAL LOW (ref 69–380)
IgG (Immunoglobin G), Serum: 136 mg/dL — ABNORMAL LOW (ref 690–1700)
IgM, Serum: 1820 mg/dL — ABNORMAL HIGH (ref 52–322)

## 2015-04-09 NOTE — Telephone Encounter (Signed)
Called pt to make her aware of results, pt voiced understanding and appreciated call.

## 2015-04-09 NOTE — Telephone Encounter (Signed)
-----   Message from Ladell Pier, MD sent at 04/05/2015  4:39 PM EDT ----- Please call patient, [name] is stable

## 2015-05-01 ENCOUNTER — Other Ambulatory Visit: Payer: Self-pay

## 2015-05-01 DIAGNOSIS — Z1231 Encounter for screening mammogram for malignant neoplasm of breast: Secondary | ICD-10-CM

## 2015-05-03 ENCOUNTER — Ambulatory Visit
Admission: RE | Admit: 2015-05-03 | Discharge: 2015-05-03 | Disposition: A | Discharge status: Home / Self Care | Payer: Medicare Other | Source: Ambulatory Visit | Attending: Geriatric Medicine | Admitting: Geriatric Medicine

## 2015-05-03 ENCOUNTER — Other Ambulatory Visit: Payer: Self-pay | Admitting: Geriatric Medicine

## 2015-05-03 DIAGNOSIS — J209 Acute bronchitis, unspecified: Secondary | ICD-10-CM | POA: Diagnosis not present

## 2015-05-03 DIAGNOSIS — J40 Bronchitis, not specified as acute or chronic: Secondary | ICD-10-CM | POA: Diagnosis not present

## 2015-05-03 DIAGNOSIS — J189 Pneumonia, unspecified organism: Secondary | ICD-10-CM | POA: Diagnosis not present

## 2015-05-03 DIAGNOSIS — R509 Fever, unspecified: Secondary | ICD-10-CM | POA: Diagnosis not present

## 2015-05-03 DIAGNOSIS — R05 Cough: Secondary | ICD-10-CM | POA: Diagnosis not present

## 2015-05-06 DIAGNOSIS — Z79899 Other long term (current) drug therapy: Secondary | ICD-10-CM | POA: Diagnosis not present

## 2015-05-07 ENCOUNTER — Other Ambulatory Visit: Payer: Self-pay | Admitting: Geriatric Medicine

## 2015-05-07 DIAGNOSIS — R9389 Abnormal findings on diagnostic imaging of other specified body structures: Secondary | ICD-10-CM

## 2015-05-07 DIAGNOSIS — J189 Pneumonia, unspecified organism: Secondary | ICD-10-CM

## 2015-05-13 ENCOUNTER — Ambulatory Visit
Admission: RE | Admit: 2015-05-13 | Discharge: 2015-05-13 | Disposition: A | Discharge status: Home / Self Care | Payer: Medicare Other | Source: Ambulatory Visit | Attending: Geriatric Medicine | Admitting: Geriatric Medicine

## 2015-05-13 DIAGNOSIS — J189 Pneumonia, unspecified organism: Secondary | ICD-10-CM

## 2015-05-13 DIAGNOSIS — R918 Other nonspecific abnormal finding of lung field: Secondary | ICD-10-CM | POA: Diagnosis not present

## 2015-05-13 DIAGNOSIS — R9389 Abnormal findings on diagnostic imaging of other specified body structures: Secondary | ICD-10-CM

## 2015-05-13 MED ORDER — IOPAMIDOL (ISOVUE-300) INJECTION 61%: 75.0000 mL | Freq: Once | INTRAVENOUS | Status: DC | PRN

## 2015-05-16 ENCOUNTER — Other Ambulatory Visit (HOSPITAL_BASED_OUTPATIENT_CLINIC_OR_DEPARTMENT_OTHER): Payer: Medicare Other

## 2015-05-16 ENCOUNTER — Ambulatory Visit (HOSPITAL_BASED_OUTPATIENT_CLINIC_OR_DEPARTMENT_OTHER): Payer: Medicare Other | Admitting: Oncology

## 2015-05-16 ENCOUNTER — Telehealth: Payer: Self-pay | Admitting: Oncology

## 2015-05-16 VITALS — BP 169/54 | HR 88 | Temp 98.7°F | Resp 21 | Ht 66.0 in | Wt 262.2 lb

## 2015-05-16 DIAGNOSIS — C880 Waldenstrom macroglobulinemia: Secondary | ICD-10-CM

## 2015-05-16 DIAGNOSIS — D709 Neutropenia, unspecified: Secondary | ICD-10-CM | POA: Diagnosis not present

## 2015-05-16 DIAGNOSIS — J42 Unspecified chronic bronchitis: Secondary | ICD-10-CM

## 2015-05-16 LAB — CBC WITH DIFFERENTIAL/PLATELET
BASO%: 0.5 % (ref 0.0–2.0)
Basophils Absolute: 0 10*3/uL (ref 0.0–0.1)
EOS%: 3.6 % (ref 0.0–7.0)
Eosinophils Absolute: 0.1 10*3/uL (ref 0.0–0.5)
HCT: 32.7 % — ABNORMAL LOW (ref 34.8–46.6)
HGB: 10.8 g/dL — ABNORMAL LOW (ref 11.6–15.9)
LYMPH%: 14.4 % (ref 14.0–49.7)
MCH: 27.2 pg (ref 25.1–34.0)
MCHC: 32.8 g/dL (ref 31.5–36.0)
MCV: 82.8 fL (ref 79.5–101.0)
MONO#: 0.2 10*3/uL (ref 0.1–0.9)
MONO%: 9.6 % (ref 0.0–14.0)
NEUT#: 1.2 10*3/uL — ABNORMAL LOW (ref 1.5–6.5)
NEUT%: 71.9 % (ref 38.4–76.8)
Platelets: 186 10*3/uL (ref 145–400)
RBC: 3.95 10*6/uL (ref 3.70–5.45)
RDW: 17.8 % — ABNORMAL HIGH (ref 11.2–14.5)
WBC: 1.6 10*3/uL — ABNORMAL LOW (ref 3.9–10.3)
lymph#: 0.2 10*3/uL — ABNORMAL LOW (ref 0.9–3.3)

## 2015-05-16 LAB — TECHNOLOGIST REVIEW

## 2015-05-16 NOTE — Progress Notes (Signed)
[place] OFFICE PROGRESS NOTE   Diagnosis: Waldenstrm's macroglobulinemia  INTERVAL HISTORY:   Natasha Ellis returns as scheduled. She reports being diagnosed with "pneumonia "again approximately 2 weeks ago. She was prescribed Levaquin by Dr. Delfina Redwood. She had a high fever and this improved. She continues to have a productive cough. She urinates frequently and relates this to taking Lasix.  Objective:  Vital signs in last 24 hours:  Blood pressure 169/54, pulse 88, temperature 98.7 F (37.1 C), temperature source Oral, resp. rate 21, height 5\' 6"  (1.676 m), weight 262 lb 3.2 oz (118.933 kg), SpO2 99 %.    HEENT: Slight prominence of the left compared to the right submandibular gland, oropharynx without visible mass Lymphatics: No cervical, supra-clavicular, or axillary nodes Resp: Bilateral inspiratory and x-ray rhonchi at the lung bases Cardio: Regular rate and rhythm GI: No hepatosplenomegaly Vascular: No leg edema   Lab Results:  Lab Results  Component Value Date   WBC 1.6* 05/16/2015   HGB 10.8* 05/16/2015   HCT 32.7* 05/16/2015   MCV 82.8 05/16/2015   PLT 186 05/16/2015   NEUTROABS 1.2* 05/16/2015   04/04/2015: IgG 136, IgA 8, [name] 1820   Imaging:  Ct Chest W Contrast  05/13/2015  CLINICAL DATA:  Persistent left lower lobe opacity on chest radiograph with history of lymphoma and smoking EXAM: CT CHEST WITH CONTRAST TECHNIQUE: Multidetector CT imaging of the chest was performed during intravenous contrast administration. CONTRAST:  34ml isovue 300 COMPARISON:  05/03/15 FINDINGS: There is consolidation with air bronchograms focally in the medial right lung base posteriorly. Anterior to this there is mild multinodular infiltrate in the azygo- esophageal recess of the right lower lobe. There is also consolidation at the left lung base. Opacity at the left lung base appears mostly consistent with platelike atelectasis. There are no endobronchial lesions.  There is no pleural or pericardial effusion. There is no significant hilar or mediastinal adenopathy. There is calcification of the thoracic aorta as well as coronary arteries. Thyroid appears normal. Images through the upper abdomen are unremarkable. There are no acute musculoskeletal findings. IMPRESSION: Bilateral lower lobe opacities appear primarily related to atelectasis, although subtle multinodular infiltrate in the more anterior aspect of the right lower lobe suggests active inflammatory or infectious cause. Electronically Signed   By: Skipper Cliche M.D.   On: 05/13/2015 17:05    Medications: I have reviewed the patient's current medications.  Assessment/Plan: 1. 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 Treated with multiple systemic therapies includingpentostatin/Cytoxan/rituximab in 2010.   Cycle 1 bendamustine/Rituxan 05/31/2014.  Cycle 2 bendamustine/Rituxan 07/02/2014  Cycle 3 bendamustine/rituximab 07/30/2014  Cycle 4 bendamustine/rituximab 09/10/2014  Cycle 5 bendamustine/rituximab 10/22/2014  Cycle 6 bendamustine/rituximab 12/10/2014 2. Left knee arthritis. Followed by Dr. Wynelle Link. 3. Sleep apnea. 4. Anemia. Chronic. 5. Question left parotitis 12/12/2013 presenting with left facial and parotid edema. 6. Admission with pneumonia February 2016 7. History of Neutropenia secondary to chemotherapy 8. Left lung pneumonia 04/02/2015    Disposition:  There is no clinical evidence for progression of the Waldenstrm's macroglobulinemia. The anemia has improved. She has mild neutropenia, potentially related to the [facility] or recent systemic therapy. Natasha Ellis appears to have chronic bronchitis with recurrent upper respiratory infections. I suspect many of the infections or viral, though  she reports improvement with Levaquin earlier this month. I reviewed  the recent CT images with Natasha Ellis and her daughter. She is very concerned that she has persistent respiratory symptoms. I recommend a referral to pulmonary medicine.  If she has recurrent documented infections we could consider IVIG therapy since the immunoglobulin levels are low.  She will return for an office and lab visit on 07/04/2015.  [name], MD  05/16/2015  10:24 AM

## 2015-05-16 NOTE — Telephone Encounter (Signed)
Gave adn printed appt sched and avs for pt for OCT DEC adn Jan

## 2015-05-17 ENCOUNTER — Telehealth: Payer: Self-pay | Admitting: *Deleted

## 2015-05-17 LAB — IGM: IgM, Serum: 1450 mg/dL — ABNORMAL HIGH (ref 52–322)

## 2015-05-17 NOTE — Telephone Encounter (Signed)
Pt called with an update on VM: "I had explosive diarrhea last night, I had 8 BMs in a few hrs, and a fever of 100.5. Also, I'd like to know if I can have a flu shot".  I called pt back to assess how she is today; she has no fever, feels significantly better, able to eat and drink, no diarrhea today. Dr. Benay Spice made aware and states it may be due to her levaquin; also, pt may have flu shot if afebrile.  No new orders. Pt appreciated call back and voiced understanding of information. Knows to call us with any questions or concerns.

## 2015-05-21 ENCOUNTER — Emergency Department (HOSPITAL_COMMUNITY)
Admission: EM | Admit: 2015-05-21 | Discharge: 2015-05-21 | Disposition: A | Discharge status: Home / Self Care | Payer: Medicare Other | Attending: Emergency Medicine | Admitting: Emergency Medicine

## 2015-05-21 ENCOUNTER — Encounter (HOSPITAL_COMMUNITY): Payer: Self-pay | Admitting: Emergency Medicine

## 2015-05-21 ENCOUNTER — Telehealth: Payer: Self-pay | Admitting: *Deleted

## 2015-05-21 ENCOUNTER — Emergency Department (HOSPITAL_COMMUNITY): Payer: Medicare Other

## 2015-05-21 DIAGNOSIS — E669 Obesity, unspecified: Secondary | ICD-10-CM | POA: Diagnosis not present

## 2015-05-21 DIAGNOSIS — J45901 Unspecified asthma with (acute) exacerbation: Secondary | ICD-10-CM | POA: Diagnosis not present

## 2015-05-21 DIAGNOSIS — J019 Acute sinusitis, unspecified: Secondary | ICD-10-CM | POA: Diagnosis not present

## 2015-05-21 DIAGNOSIS — Z8572 Personal history of non-Hodgkin lymphomas: Secondary | ICD-10-CM | POA: Insufficient documentation

## 2015-05-21 DIAGNOSIS — Z87891 Personal history of nicotine dependence: Secondary | ICD-10-CM | POA: Insufficient documentation

## 2015-05-21 DIAGNOSIS — Z79899 Other long term (current) drug therapy: Secondary | ICD-10-CM | POA: Insufficient documentation

## 2015-05-21 DIAGNOSIS — M47816 Spondylosis without myelopathy or radiculopathy, lumbar region: Secondary | ICD-10-CM | POA: Diagnosis not present

## 2015-05-21 DIAGNOSIS — I10 Essential (primary) hypertension: Secondary | ICD-10-CM | POA: Diagnosis not present

## 2015-05-21 DIAGNOSIS — Z8669 Personal history of other diseases of the nervous system and sense organs: Secondary | ICD-10-CM | POA: Diagnosis not present

## 2015-05-21 DIAGNOSIS — Z8719 Personal history of other diseases of the digestive system: Secondary | ICD-10-CM | POA: Diagnosis not present

## 2015-05-21 DIAGNOSIS — Z7902 Long term (current) use of antithrombotics/antiplatelets: Secondary | ICD-10-CM | POA: Diagnosis not present

## 2015-05-21 DIAGNOSIS — H269 Unspecified cataract: Secondary | ICD-10-CM | POA: Insufficient documentation

## 2015-05-21 DIAGNOSIS — Z7951 Long term (current) use of inhaled steroids: Secondary | ICD-10-CM | POA: Insufficient documentation

## 2015-05-21 DIAGNOSIS — R509 Fever, unspecified: Secondary | ICD-10-CM | POA: Diagnosis not present

## 2015-05-21 LAB — CBC WITH DIFFERENTIAL/PLATELET
Basophils Absolute: 0 10*3/uL (ref 0.0–0.1)
Basophils Relative: 1 %
Eosinophils Absolute: 0 10*3/uL (ref 0.0–0.7)
Eosinophils Relative: 2 %
HCT: 28 % — ABNORMAL LOW (ref 36.0–46.0)
Hemoglobin: 9.9 g/dL — ABNORMAL LOW (ref 12.0–15.0)
Lymphocytes Relative: 24 %
Lymphs Abs: 0.5 10*3/uL — ABNORMAL LOW (ref 0.7–4.0)
MCH: 31.7 pg (ref 26.0–34.0)
MCHC: 35.4 g/dL (ref 30.0–36.0)
MCV: 89.7 fL (ref 78.0–100.0)
Monocytes Absolute: 0.3 10*3/uL (ref 0.1–1.0)
Monocytes Relative: 16 %
Neutro Abs: 1.1 10*3/uL — ABNORMAL LOW (ref 1.7–7.7)
Neutrophils Relative %: 57 %
Platelets: 184 10*3/uL (ref 150–400)
RBC: 3.12 MIL/uL — ABNORMAL LOW (ref 3.87–5.11)
RDW: 18.4 % — ABNORMAL HIGH (ref 11.5–15.5)
WBC: 1.9 10*3/uL — ABNORMAL LOW (ref 4.0–10.5)

## 2015-05-21 LAB — COMPREHENSIVE METABOLIC PANEL
ALT: 18 U/L (ref 14–54)
AST: 19 U/L (ref 15–41)
Albumin: 3.3 g/dL — ABNORMAL LOW (ref 3.5–5.0)
Alkaline Phosphatase: 87 U/L (ref 38–126)
Anion gap: 8 (ref 5–15)
BUN: 16 mg/dL (ref 6–20)
CO2: 26 mmol/L (ref 22–32)
Calcium: 8.7 mg/dL — ABNORMAL LOW (ref 8.9–10.3)
Chloride: 104 mmol/L (ref 101–111)
Creatinine, Ser: 0.88 mg/dL (ref 0.44–1.00)
GFR calc Af Amer: >60 mL/min (ref >60)
GFR calc non Af Amer: 60 mL/min — ABNORMAL LOW (ref >60)
Glucose, Bld: 93 mg/dL (ref 65–99)
Potassium: 4.3 mmol/L (ref 3.5–5.1)
Sodium: 138 mmol/L (ref 135–145)
Total Bilirubin: 1 mg/dL (ref 0.3–1.2)
Total Protein: 7 g/dL (ref 6.5–8.1)

## 2015-05-21 LAB — URINALYSIS, ROUTINE W REFLEX MICROSCOPIC
Bilirubin Urine: NEGATIVE
Glucose, UA: NEGATIVE mg/dL
Ketones, ur: NEGATIVE mg/dL
Leukocytes, UA: NEGATIVE
Nitrite: NEGATIVE
Protein, ur: NEGATIVE mg/dL
Specific Gravity, Urine: 1.008 (ref 1.005–1.030)
Urobilinogen, UA: 0.2 mg/dL (ref 0.0–1.0)
pH: 6 (ref 5.0–8.0)

## 2015-05-21 LAB — I-STAT CG4 LACTIC ACID, ED: Lactic Acid, Venous: 0.42 mmol/L — ABNORMAL LOW (ref 0.5–2.0)

## 2015-05-21 LAB — URINE MICROSCOPIC-ADD ON

## 2015-05-21 MED ORDER — ALBUTEROL SULFATE (2.5 MG/3ML) 0.083% IN NEBU
2.5000 mg | INHALATION_SOLUTION | Freq: Once | RESPIRATORY_TRACT | Status: AC
  Administered 2015-05-21: 2.5 mg via RESPIRATORY_TRACT
  Filled 2015-05-21: qty 3

## 2015-05-21 MED ORDER — AMOXICILLIN 500 MG PO CAPS: 500.0000 mg | ORAL_CAPSULE | Freq: Three times a day (TID) | ORAL | Status: DC

## 2015-05-21 MED ORDER — ALBUTEROL (5 MG/ML) CONTINUOUS INHALATION SOLN
5.0000 mg/h | INHALATION_SOLUTION | Freq: Once | RESPIRATORY_TRACT | Status: DC
  Filled 2015-05-21: qty 20

## 2015-05-21 NOTE — ED Provider Notes (Signed)
CSN: [id number]     Arrival date & time 05/21/15  1746 History   None    Chief Complaint  Patient presents with  . Fever  . Weakness     (Consider location/radiation/quality/duration/timing/severity/associated sxs/prior Treatment) Patient is a 81 y.o. female presenting with fever and weakness. The history is provided by the patient and a relative. No language interpreter was used.  Fever Associated symptoms: cough   Associated symptoms: no chest pain   Weakness Associated symptoms include coughing, a fever and weakness. Pertinent negatives include no abdominal pain or chest pain.  Ms. Fredricksen is an 81 y.o female with a history of lymphoma, hypertension, hyperlipidemia, asthma, sleep apnea and recurrent pneumonia who presents for weakness, chills, and fever (Tmax 102.0). She took DayQuil last night and ibuprofen 4 hours ago. She states she has been feeling more weak over the past few weeks and has had an intermittent fever that has been less than 100.4. She had pneumonia 3 weeks ago and was put on Levaquin. She has had multiple episodes of bronchitis which have not been treated with antibiotics. She had a CT scan done 8 days ago which showed a possible infiltrate but she was not treated at that time since her pcp thought it was mostly viral and reoccurring frequently.  She denies any chest pain, abdominal pain, nausea, vomiting, diarrhea, or dysuria.  Past Medical History  Diagnosis Date  . 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 Hypertension   . Arthritis   . Hypercholesteremia   . IBS (irritable bowel syndrome)   . Diverticulosis of intestine     H/O  . DJD (degenerative joint disease) of lumbar spine   . Left knee DJD   . Right knee DJD   . Chronic cough   . Asthma   . Cataract   . Sleep apnea   . Cancer [facility])     Lymphoma   Past Surgical History  Procedure Laterality Date  . Tonsillectomy and adenoidectomy  childhood  . Abdominal hysterectomy  1984    TAH  .  Abdominal hysterectomy     Family History  Problem Relation Age of Onset  . Stroke Father    Social History  Substance Use Topics  . Smoking status: Former Smoker -- 1.00 packs/day for 25 years    Types: Cigarettes    Quit date: 08/04/1979  . Smokeless tobacco: Never Used  . Alcohol Use: No   OB History    Gravida Para Term Preterm AB TAB SAB Ectopic Multiple Living   0 0 0 0 0 0 0 0       Review of Systems  Constitutional: Positive for fever.  Respiratory: Positive for cough.   Cardiovascular: Negative for chest pain.  Gastrointestinal: Negative for abdominal pain.  Neurological: Positive for weakness.  All other systems reviewed and are negative.     Allergies  Codeine; Erythromycin; Lisinopril; Lisinopril; Oxybutynin; and Sulfa antibiotics  Home Medications   Prior to Admission medications   Medication Sig Start Date End Date Taking? Authorizing Provider  acetaminophen (TYLENOL) 500 MG tablet Take 500 mg by mouth every 6 (six) hours as needed for pain.   Yes Historical Provider, MD  albuterol (PROVENTIL) (2.5 MG/3ML) 0.083% nebulizer solution Take 3 mLs (2.5 mg total) by nebulization every 4 (four) hours as needed for wheezing or shortness of breath. 09/22/14  Yes Theodis Blaze, MD  alendronate (FOSAMAX) 70 MG tablet Take 70 mg by mouth once a week. Sundays 10/14/14  Yes  Historical Provider, MD  cholecalciferol (VITAMIN D) 1000 UNITS tablet Take 1,000 Units by mouth daily.    Yes Historical Provider, MD  cholestyramine Lucrezia Starch) 4 GM/DOSE powder Take 4 g by mouth daily as needed.    Yes Historical Provider, MD  clopidogrel (PLAVIX) 75 MG tablet Take 1 tablet (75 mg total) by mouth daily with breakfast. 05/01/13  Yes Eugenie Filler, MD  fluticasone [facility]) 50 MCG/ACT nasal spray Place 1 spray into the nose 2 (two) times daily as needed. Each nostril   Yes Historical Provider, MD  furosemide (LASIX) 20 MG tablet Take 1 tablet (20 mg total) by mouth daily. 03/30/13  Yes  Brand Males, MD  guaifenesin (ROBITUSSIN) 100 MG/5ML syrup Take 10 mLs (200 mg total) by mouth every 4 (four) hours as needed for congestion. 09/22/14  Yes Theodis Blaze, MD  ibuprofen (ADVIL,MOTRIN) 200 MG tablet Take 200 mg by mouth every 6 (six) hours as needed for pain.   Yes Historical Provider, MD  Multiple Vitamin (MULTIVITAMIN) capsule Take 1 capsule by mouth daily. With Vitamin D 4000   Yes Historical Provider, MD  potassium chloride (K-DUR) 10 MEQ tablet Take 10 mEq by mouth daily.   Yes Historical Provider, MD  Pseudoephedrine-APAP-DM (DAYQUIL PO) Take 2 tablets by mouth once.   Yes Historical Provider, MD  albuterol (PROVENTIL HFA;VENTOLIN HFA) 108 (90 BASE) MCG/ACT inhaler Inhale 1-2 puffs into the lungs every 6 (six) hours as needed for wheezing or shortness of breath. Patient not taking: Reported on 01/17/2015 06/13/14   Owens Shark, NP  amoxicillin (AMOXIL) 500 MG capsule Take 1 capsule (500 mg total) by mouth 3 (three) times daily. 05/21/15   Hamilton Marinello Patel-Mills, PA-C  loperamide (IMODIUM) 2 MG capsule Take 1 capsule (2 mg total) by mouth 4 (four) times daily as needed for diarrhea or loose stools. Patient not taking: Reported on 05/16/2015 05/01/13   Eugenie Filler, MD   BP 149/60 mmHg  Pulse 72  Temp(Src) 97.7 F (36.5 C) (Oral)  Resp 18  Ht 5\' 6"  (1.676 m)  Wt 260 lb (117.935 kg)  BMI 41.99 kg/m2  SpO2 96% Physical Exam  Constitutional: She is oriented to person, place, and time. Vital signs are normal. She appears well-developed and well-nourished.  She appears tired.   Obese.  HENT:  Head: Normocephalic and atraumatic.  Eyes: Conjunctivae are normal.  Neck: Normal range of motion. Neck supple.  Cardiovascular: Normal rate, regular rhythm and normal heart sounds.   Pulmonary/Chest: Effort normal. She has decreased breath sounds. She has wheezes.  Decreased breath sounds right greater than left. Wheezing bilaterally. No respiratory distress or use of accessory  muscles. No drooling.  Abdominal: Soft. There is no tenderness.  Musculoskeletal: Normal range of motion.  Neurological: She is alert and oriented to person, place, and time.  Skin: Skin is warm and dry.  Psychiatric: She has a normal mood and affect.  Nursing note and vitals reviewed.   ED Course  Procedures (including critical care time) Labs Review Labs Reviewed  CBC WITH DIFFERENTIAL/PLATELET - Abnormal; Notable for the following:    WBC 1.9 (*)    RBC 3.12 (*)    Hemoglobin 9.9 (*)    HCT 28.0 (*)    RDW 18.4 (*)    Neutro Abs 1.1 (*)    Lymphs Abs 0.5 (*)    All other components within normal limits  URINALYSIS, ROUTINE W REFLEX MICROSCOPIC (NOT AT [facility]) - Abnormal; Notable for the following:  Hgb urine dipstick TRACE (*)    All other components within normal limits  COMPREHENSIVE METABOLIC PANEL - Abnormal; Notable for the following:    Calcium 8.7 (*)    Albumin 3.3 (*)    GFR calc non Af Amer 60 (*)    All other components within normal limits  I-STAT CG4 LACTIC ACID, ED - Abnormal; Notable for the following:    Lactic Acid, Venous 0.42 (*)    All other components within normal limits  URINE MICROSCOPIC-ADD ON  I-STAT CG4 LACTIC ACID, ED    Imaging Review Dg Chest 2 View  05/21/2015  CLINICAL DATA:  Wheezing and fever.  Weakness.  Recurrent pneumonia. EXAM: CHEST  2 VIEW COMPARISON:  Chest CT 05/13/2015, radiographs 05/03/2015, 04/02/2015 FINDINGS: Cardiomediastinal contours are unchanged. Increased peribronchial thickening and interstitial markings from prior exam in a diffuse pattern. Residual atelectasis or scarring at the left lung base. No new airspace disease. The small dependent consolidation in the right lower lobe on prior CT is not well evaluated radiographically. No pleural effusion or pneumothorax. No acute osseous abnormalities are seen. IMPRESSION: 1. Increased peribronchial thickening and interstitial marking from prior exams. This may reflect  pulmonary edema, atypical infection, bronchitis, or reactive airways disease such as asthma. 2. Residual atelectasis at the left lung base. Electronically Signed   By: Jeb Levering M.D.   On: 05/21/2015 18:41   I have personally reviewed and evaluated these images and lab results as part of my medical decision-making.   EKG Interpretation None      MDM   Final diagnoses:  Acute sinusitis, recurrence not specified, unspecified location   Patient presents for fever and weakness for the past couple of weeks but worse in the last few days. She was diagnosed with pneumonia 3 weeks ago and started on Levaquin. She was seen by her oncologist last week but stated that she did not feel this bad at that time. She had a CT scan of her chest on it showed bilateral lower lobe opacities in the right lower lobe that looked inflammatory or infectious. She was not put on antibiotics or steroids at that time. She says that she has declined in the past week and felt fatigued.  Her lactic acid is normal, labs are not concerning, her chest xray shows increased peribronchial thickening but is similar to her CT scan that she had done recently.  I spoke to Dr. Canary Brim who has seen and evaluated the patient.  The patient most likely has sinusitis and a viral upper respiratory infection.  She will be started on amoxicillin.  Patient and daughter agree with the plan.  I discussed return precautions and follow up. Filed Vitals:   05/21/15 2111  BP: 149/60  Pulse: 72  Temp:   Resp: 18   Medications  albuterol (PROVENTIL) (2.5 MG/3ML) 0.083% nebulizer solution 2.5 mg (2.5 mg Nebulization Given 05/21/15 2023)     Ottie Glazier, PA-C 05/21/15 2155  Alfonzo Beers, MD 05/21/15 2209

## 2015-05-21 NOTE — Telephone Encounter (Signed)
agree

## 2015-05-21 NOTE — Telephone Encounter (Signed)
TC from patient stating she is not feeling well and has a fever of 102.2. She has a h/o respiratory infections recently. Last ANC on 05/16/15 was 1.2  Advised pt to go to [facility] for evaluation. Pt agreed and has someone to take her.

## 2015-05-21 NOTE — ED Notes (Signed)
Pt states she has a hx of lymphoma and has had recurrent PNA recently and thinks she has it again as she feels generally weak, had a 102 fever today. Took ibuprofen around 2pm. Alert and oriented.

## 2015-05-21 NOTE — Discharge Instructions (Signed)
Sinusitis, Adult Follow-up with your primary care provider. Return for fever, increased shortness of breath or difficulty breathing, chest pain. Sinusitis is redness, soreness, and inflammation of the paranasal sinuses. Paranasal sinuses are air pockets within the bones of your face. They are located beneath your eyes, in the middle of your forehead, and above your eyes. In healthy paranasal sinuses, mucus is able to drain out, and air is able to circulate through them by way of your nose. However, when your paranasal sinuses are inflamed, mucus and air can become trapped. This can allow bacteria and other germs to grow and cause infection. Sinusitis can develop quickly and last only a short time (acute) or continue over a long period (chronic). Sinusitis that lasts for more than 12 weeks is considered chronic. CAUSES Causes of sinusitis include:  Allergies.  Structural abnormalities, such as displacement of the cartilage that separates your nostrils (deviated septum), which can decrease the air flow through your nose and sinuses and affect sinus drainage.  Functional abnormalities, such as when the small hairs (cilia) that line your sinuses and help remove mucus do not work properly or are not present. SIGNS AND SYMPTOMS Symptoms of acute and chronic sinusitis are the same. The primary symptoms are pain and pressure around the affected sinuses. Other symptoms include:  Upper toothache.  Earache.  Headache.  Bad breath.  Decreased sense of smell and taste.  A cough, which worsens when you are lying flat.  Fatigue.  Fever.  Thick drainage from your nose, which often is green and may contain pus (purulent).  Swelling and warmth over the affected sinuses. DIAGNOSIS Your health care provider will perform a physical exam. During your exam, your health care provider may perform any of the following to help determine if you have acute sinusitis or chronic sinusitis:  Look in your nose  for signs of abnormal growths in your nostrils (nasal polyps).  Tap over the affected sinus to check for signs of infection.  View the inside of your sinuses using an imaging device that has a light attached (endoscope). If your health care provider suspects that you have chronic sinusitis, one or more of the following tests may be recommended:  Allergy tests.  Nasal culture. A sample of mucus is taken from your nose, sent to a lab, and screened for bacteria.  Nasal cytology. A sample of mucus is taken from your nose and examined by your health care provider to determine if your sinusitis is related to an allergy. TREATMENT Most cases of acute sinusitis are related to a viral infection and will resolve on their own within 10 days. Sometimes, medicines are prescribed to help relieve symptoms of both acute and chronic sinusitis. These may include pain medicines, decongestants, nasal steroid sprays, or saline sprays. However, for sinusitis related to a bacterial infection, your health care provider will prescribe antibiotic medicines. These are medicines that will help kill the bacteria causing the infection. Rarely, sinusitis is caused by a fungal infection. In these cases, your health care provider will prescribe antifungal medicine. For some cases of chronic sinusitis, surgery is needed. Generally, these are cases in which sinusitis recurs more than 3 times per year, despite other treatments. HOME CARE INSTRUCTIONS  Drink plenty of water. Water helps thin the mucus so your sinuses can drain more easily.  Use a humidifier.  Inhale steam 3-4 times a day (for example, sit in the bathroom with the shower running).  Apply a warm, moist washcloth to your face 3-4  times a day, or as directed by your health care provider.  Use saline nasal sprays to help moisten and clean your sinuses.  Take medicines only as directed by your health care provider.  If you were prescribed either an antibiotic  or antifungal medicine, finish it all even if you start to feel better. SEEK IMMEDIATE MEDICAL CARE IF:  You have increasing pain or severe headaches.  You have nausea, vomiting, or drowsiness.  You have swelling around your face.  You have vision problems.  You have a stiff neck.  You have difficulty breathing.   This information is not intended to replace advice given to you by your health care provider. Make sure you discuss any questions you have with your health care provider.   Document Released: 07/20/2005 Document Revised: 08/10/2014 Document Reviewed: 08/04/2011 Elsevier Interactive Patient Education [employer].

## 2015-05-21 NOTE — Progress Notes (Signed)
EDCM spoke to patient at bedside.  Patient reports she lives at the [facility] and does not have any home health or dme needs at this time.  Patient reports she is able to perform her own ADL's without difficulty.  No further EDCM needs at this time.

## 2015-05-23 ENCOUNTER — Ambulatory Visit: Payer: Medicare Other

## 2015-05-24 ENCOUNTER — Ambulatory Visit: Payer: Medicare Other

## 2015-05-29 ENCOUNTER — Telehealth: Payer: Self-pay | Admitting: Oncology

## 2015-05-29 ENCOUNTER — Telehealth: Payer: Self-pay | Admitting: *Deleted

## 2015-05-29 NOTE — Telephone Encounter (Signed)
s.w. pt and advised on NOV appt....pt ok and aware °

## 2015-05-29 NOTE — Telephone Encounter (Signed)
Received message yesterday re: moving up appt to discuss IVIG treatments.  Per Dr. Benay Spice; left voice message that MD can move appt to 06/10/15 instead of 07/08/15; call back if any questions.

## 2015-06-10 ENCOUNTER — Other Ambulatory Visit: Payer: Self-pay | Admitting: *Deleted

## 2015-06-10 ENCOUNTER — Ambulatory Visit (HOSPITAL_BASED_OUTPATIENT_CLINIC_OR_DEPARTMENT_OTHER): Payer: Medicare Other | Admitting: Oncology

## 2015-06-10 ENCOUNTER — Other Ambulatory Visit (HOSPITAL_BASED_OUTPATIENT_CLINIC_OR_DEPARTMENT_OTHER): Payer: Medicare Other

## 2015-06-10 ENCOUNTER — Telehealth: Payer: Self-pay | Admitting: Internal Medicine

## 2015-06-10 ENCOUNTER — Telehealth: Payer: Self-pay | Admitting: Oncology

## 2015-06-10 VITALS — BP 137/57 | HR 73 | Temp 98.7°F | Resp 17 | Ht 66.0 in | Wt 255.9 lb

## 2015-06-10 DIAGNOSIS — R5381 Other malaise: Secondary | ICD-10-CM | POA: Diagnosis not present

## 2015-06-10 DIAGNOSIS — R05 Cough: Secondary | ICD-10-CM

## 2015-06-10 DIAGNOSIS — D509 Iron deficiency anemia, unspecified: Secondary | ICD-10-CM

## 2015-06-10 DIAGNOSIS — C8800 Waldenstrom macroglobulinemia not having achieved remission: Secondary | ICD-10-CM

## 2015-06-10 DIAGNOSIS — D649 Anemia, unspecified: Secondary | ICD-10-CM | POA: Diagnosis not present

## 2015-06-10 DIAGNOSIS — R0600 Dyspnea, unspecified: Secondary | ICD-10-CM

## 2015-06-10 DIAGNOSIS — D6489 Other specified anemias: Secondary | ICD-10-CM

## 2015-06-10 DIAGNOSIS — R053 Chronic cough: Secondary | ICD-10-CM

## 2015-06-10 LAB — COMPREHENSIVE METABOLIC PANEL (CC13)
ALT: 14 U/L (ref 0–55)
AST: 16 U/L (ref 5–34)
Albumin: 3.1 g/dL — ABNORMAL LOW (ref 3.5–5.0)
Alkaline Phosphatase: 84 U/L (ref 40–150)
Anion Gap: 8 mEq/L (ref 3–11)
BUN: 14.7 mg/dL (ref 7.0–26.0)
CO2: 26 mEq/L (ref 22–29)
Calcium: 9.4 mg/dL (ref 8.4–10.4)
Chloride: 106 mEq/L (ref 98–109)
Creatinine: 0.8 mg/dL (ref 0.6–1.1)
EGFR: 70 mL/min/{1.73_m2} — ABNORMAL LOW (ref >90)
Glucose: 86 mg/dl (ref 70–140)
Potassium: 4.2 mEq/L (ref 3.5–5.1)
Sodium: 140 mEq/L (ref 136–145)
Total Bilirubin: 0.57 mg/dL (ref 0.20–1.20)
Total Protein: 6.6 g/dL (ref 6.4–8.3)

## 2015-06-10 LAB — CBC WITH DIFFERENTIAL/PLATELET
BASO%: 0.4 % (ref 0.0–2.0)
Basophils Absolute: 0 10*3/uL (ref 0.0–0.1)
EOS%: 2.4 % (ref 0.0–7.0)
Eosinophils Absolute: 0.1 10*3/uL (ref 0.0–0.5)
HCT: 29.3 % — ABNORMAL LOW (ref 34.8–46.6)
HGB: 9.5 g/dL — ABNORMAL LOW (ref 11.6–15.9)
LYMPH%: 11.5 % — ABNORMAL LOW (ref 14.0–49.7)
MCH: 25.7 pg (ref 25.1–34.0)
MCHC: 32.6 g/dL (ref 31.5–36.0)
MCV: 79 fL — ABNORMAL LOW (ref 79.5–101.0)
MONO#: 0.2 10*3/uL (ref 0.1–0.9)
MONO%: 9.4 % (ref 0.0–14.0)
NEUT#: 1.9 10*3/uL (ref 1.5–6.5)
NEUT%: 76.3 % (ref 38.4–76.8)
Platelets: 269 10*3/uL (ref 145–400)
RBC: 3.71 10*6/uL (ref 3.70–5.45)
RDW: 18.1 % — ABNORMAL HIGH (ref 11.2–14.5)
WBC: 2.5 10*3/uL — ABNORMAL LOW (ref 3.9–10.3)
lymph#: 0.3 10*3/uL — ABNORMAL LOW (ref 0.9–3.3)

## 2015-06-10 LAB — FERRITIN CHCC: Ferritin: 411 ng/ml — ABNORMAL HIGH (ref 9–269)

## 2015-06-10 LAB — TECHNOLOGIST REVIEW

## 2015-06-10 NOTE — Telephone Encounter (Signed)
lmtcb x1 for pt. 

## 2015-06-10 NOTE — Telephone Encounter (Signed)
Gave and printed appt sched and avs for pt for DEc °

## 2015-06-10 NOTE — Progress Notes (Signed)
  [facility] OFFICE PROGRESS NOTE   Diagnosis: Waldenstrm's macroglobulinemia  INTERVAL HISTORY:   [name] returns prior to a scheduled visit. She complains of malaise. She fatigues easily. She has intermittent dyspnea. She has a cough, productive of a yellow sputum. She has intermittent fever. Natasha Ellis was seen in the emergency room 05/21/2015. She was treated with amoxicillin for sinusitis.  Objective:  Vital signs in last 24 hours:  Blood pressure 137/57, pulse 73, temperature 98.7 F (37.1 C), temperature source Oral, resp. rate 17, height 5\' 6"  (1.676 m), weight 255 lb 14.4 oz (116.075 kg), SpO2 97 %.    HEENT: No thrush. Slight nodularity at the left submandibular gland Lymphatics: No cervical, supraclavicular, axillary, or inguinal nodes Resp: Bilateral expiratory rhonchi at the upper and lower posterior chest Cardio: Regular rate and rhythm GI: No hepatosplenomegaly Vascular: No leg edema   Lab Results:  Lab Results  Component Value Date   WBC 2.5* 06/10/2015   HGB 9.5* 06/10/2015   HCT 29.3* 06/10/2015   MCV 79.0* 06/10/2015   PLT 269 06/10/2015   NEUTROABS 1.9 06/10/2015   05/16/2015-[name] 1450  Medications: I have reviewed the patient's current medications.  Assessment/Plan: 1. 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 Treated with multiple systemic therapies includingpentostatin/Cytoxan/rituximab in 2010.   Cycle 1 bendamustine/Rituxan 05/31/2014.  Cycle 2 bendamustine/Rituxan 07/02/2014  Cycle 3 bendamustine/rituximab 07/30/2014  Cycle 4 bendamustine/rituximab 09/10/2014  Cycle 5 bendamustine/rituximab 10/22/2014  Cycle 6 bendamustine/rituximab 12/10/2014 2. Left knee arthritis. Followed by Dr. Wynelle Link. 3. Sleep apnea. 4. Anemia.  Chronic. 5. Question left parotitis 12/12/2013 presenting with left facial and parotid edema. 6. Admission with pneumonia February 2016 7. History of Neutropenia secondary to chemotherapy 8. Left lung pneumonia 04/02/2015 9. Low IgG and IgA 10. CT chest 05/13/2015 with bilateral lower lung consolidation   Disposition:  Natasha Ellis complains of malaise, exertional dyspnea, and a persistent cough. I suspect her symptoms are related to chronic bronchitis or potentially an underlying pulmonary infection. There is no evidence for progression of the Waldenstrm's macroglobulinemia. The anemia is likely related to chronic disease and Waldenstrm's MacDougald anemia. The MCV is lower today. We will check a ferritin level. She will return for an office and lab visit in one month.  She asked about treated with IVIG therapy. I do not recommend IVIG therapy unless she has documented recurrent infections.  We will try to get her evaluated by pulmonary medicine soon.  [name], MD  06/10/2015  2:33 PM

## 2015-06-11 LAB — IGM: IgM, Serum: 1450 mg/dL — ABNORMAL HIGH (ref 52–322)

## 2015-06-11 NOTE — Telephone Encounter (Signed)
Called and spoke to pt. Pt has an upcoming appt with TP on 11/16, pt states she is unsure if she wants to change to RA right now. Pt states she will wait till her appt with TP to decide. Pt requesting to keep the January appt with RA until she sees TP. Nothing further needed at this time.

## 2015-06-19 ENCOUNTER — Ambulatory Visit: Payer: Medicare Other | Admitting: Adult Health

## 2015-06-19 ENCOUNTER — Other Ambulatory Visit: Payer: Medicare Other

## 2015-06-19 ENCOUNTER — Ambulatory Visit (INDEPENDENT_AMBULATORY_CARE_PROVIDER_SITE_OTHER)
Admission: RE | Admit: 2015-06-19 | Discharge: 2015-06-19 | Disposition: A | Discharge status: Home / Self Care | Payer: Medicare Other | Source: Ambulatory Visit | Attending: Pulmonary Disease | Admitting: Pulmonary Disease

## 2015-06-19 ENCOUNTER — Ambulatory Visit
Admission: RE | Admit: 2015-06-19 | Discharge: 2015-06-19 | Disposition: A | Discharge status: Home / Self Care | Payer: Medicare Other | Source: Ambulatory Visit

## 2015-06-19 ENCOUNTER — Ambulatory Visit (INDEPENDENT_AMBULATORY_CARE_PROVIDER_SITE_OTHER): Payer: Medicare Other | Admitting: Pulmonary Disease

## 2015-06-19 ENCOUNTER — Other Ambulatory Visit: Payer: Self-pay | Admitting: Pulmonary Disease

## 2015-06-19 ENCOUNTER — Encounter: Payer: Self-pay | Admitting: Pulmonary Disease

## 2015-06-19 VITALS — BP 110/64 | HR 71 | Ht 66.0 in | Wt 258.2 lb

## 2015-06-19 DIAGNOSIS — R509 Fever, unspecified: Secondary | ICD-10-CM | POA: Diagnosis not present

## 2015-06-19 DIAGNOSIS — J189 Pneumonia, unspecified organism: Secondary | ICD-10-CM

## 2015-06-19 DIAGNOSIS — G4733 Obstructive sleep apnea (adult) (pediatric): Secondary | ICD-10-CM

## 2015-06-19 DIAGNOSIS — R0602 Shortness of breath: Secondary | ICD-10-CM | POA: Diagnosis not present

## 2015-06-19 DIAGNOSIS — Z1231 Encounter for screening mammogram for malignant neoplasm of breast: Secondary | ICD-10-CM | POA: Diagnosis not present

## 2015-06-19 DIAGNOSIS — Z9989 Dependence on other enabling machines and devices: Secondary | ICD-10-CM

## 2015-06-19 DIAGNOSIS — R05 Cough: Secondary | ICD-10-CM | POA: Diagnosis not present

## 2015-06-19 MED ORDER — PREDNISONE 10 MG PO TABS: ORAL_TABLET | ORAL | Status: DC

## 2015-06-19 MED ORDER — LEVOFLOXACIN 500 MG PO TABS: 500.0000 mg | ORAL_TABLET | Freq: Every day | ORAL | Status: DC

## 2015-06-19 NOTE — Patient Instructions (Signed)
Bronchoscopy scheduled for 11/23 -Wednesday at [facility] 9 am Nothing to eat after Mn the night before STOP taking plavix today  CXR today  Check sputum for culture and afb Prednisone 10 mg tabs  Take 2 tabs daily with food x 5ds, then 1 tab daily with food x 5ds then STOP Rx for levaquin 500 daily x 14 - start based on CXR results

## 2015-06-19 NOTE — Progress Notes (Signed)
Subjective:    Patient ID: Natasha Ellis, female    DOB: July 05, 1931, 81 y.o.   MRN: [id number]  HPI  81 year old female. , last seen by MR in 09/2013 presents for evaluation of recurrent pneumonia and abnormal imaging studies - referred by Dr.Sherill CHronic cough x 11 years.  - OSA -compliant with CPAP 8 cm  Also, dyspnea. Insidious onet. Few to several months. Noticed it after oxygen was discontinued in sept 2013 when she moved from [place] to [place]. Was using only nocutrnal oxygen.  She was seeing [facility] in [place].   - Low grage B cell diagnosed 2004. S/p chemo 2011 at [place]. 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 Treated with multiple systemic therapies, Last 12/2014   She was treated for left lower lobe pneumonia in 03/2015 with Levaquin for 7 days. Since then she has not felt right and reports daily cough and fevers. She wonders if she should've had a longer duration of antibiotics. She reports increased dyspnea on exertion and occasional wheezing  CT chest in/10/16 showed bilateral lower lobe consolidation. She was again treated with an antibiotic for this Follow-up chest x-ray on 05/21/15 showed residual atelectasis at the left lung base and increased interstitial markings. Chest x-ray 06/19/15 shows persistent left basilar atelectasis   She has a 25 pack-year smoking history.   Past Medical History  Diagnosis Date  . 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 Hypertension   . Arthritis   . Hypercholesteremia   . IBS (irritable bowel syndrome)   . Diverticulosis of intestine     H/O  . DJD (degenerative joint disease) of lumbar spine   . Left knee DJD   . Right knee DJD   . Chronic cough   . Asthma   . Cataract   . Sleep apnea   . Cancer [facility])     Lymphoma     Past Surgical History  Procedure Laterality Date  . Tonsillectomy and adenoidectomy  childhood  . Abdominal hysterectomy   1984    TAH  . Abdominal hysterectomy      Allergies  Allergen Reactions  . Codeine Other (See Comments)    Crazy-nightmares  . Amoxicillin Diarrhea  . Erythromycin Other (See Comments)    GI upset  . Lisinopril Cough  . Lisinopril     unknown  . Oxybutynin   . Sulfa Antibiotics Other (See Comments)    GI upset    Social History   Social History  . Marital Status: Widowed    Spouse Name: N/A  . Number of Children: N/A  . Years of Education: N/A   Occupational History  . Not on file.   Social History Main Topics  . Smoking status: Former Smoker -- 1.00 packs/day for 25 years    Types: Cigarettes    Quit date: 08/04/1979  . Smokeless tobacco: Never Used  . Alcohol Use: No  . Drug Use: No  . Sexual Activity: Not on file   Other Topics Concern  . Not on file   Social History Narrative   ** Merged History Encounter **       Resides at [facility] History  Problem Relation Age of Onset  . Stroke Father       Review of Systems neg for any significant sore throat, dysphagia, itching, sneezing, nasal congestion or excess/ purulent secretions,  sweats, unintended wt loss, pleuritic or exertional  cp, hempoptysis, orthopnea pnd or change in chronic leg swelling. Also denies presyncope, palpitations, heartburn, abdominal pain, nausea, vomiting, diarrhea or change in bowel or urinary habits, dysuria,hematuria, rash, arthralgias, visual complaints, headache, numbness weakness or ataxia.     Objective:   Physical Exam  Gen. Pleasant, obese, in no distress, normal affect ENT - no lesions, no post nasal drip, class 2-3 airway Neck: No JVD, no thyromegaly, no carotid bruits Lungs: no use of accessory muscles, no dullness to percussion, decreased without rales, faint scattered rhonchi after coughing Cardiovascular: Rhythm regular, heart sounds  normal, no murmurs or gallops, no peripheral edema Abdomen: soft and non-tender, no hepatosplenomegaly, BS  normal. Musculoskeletal: No deformities, no cyanosis or clubbing Neuro:  alert, non focal, no tremors       Assessment & Plan:

## 2015-06-19 NOTE — Assessment & Plan Note (Addendum)
She should be considered as an immunocompromised patient. It is indeed possible that she needed a longer duration of antibiotics, other possibility includes resistant organism or noninfectious/inflammatory etiology  Bronchoscopy scheduled for 11/23 -Wednesday at [facility] am Nothing to eat after Mn the night before STOP taking plavix today  CXR today  Check sputum for culture and afb Prednisone 10 mg tabs  Take 2 tabs daily with food x 5ds, then 1 tab daily with food x 5ds then STOP Rx for levaquin 500 daily x 14 - hold off based on CXR results until bronchoscopy

## 2015-06-19 NOTE — Assessment & Plan Note (Signed)
Supplies will be renewed She sees Dr. Maxwell Caul

## 2015-06-20 NOTE — Progress Notes (Signed)
Quick Note:  Contacted pt with results per RA Pt expressed understanding, nothing further needed ______

## 2015-06-23 LAB — RESPIRATORY CULTURE OR RESPIRATORY AND SPUTUM CULTURE

## 2015-06-25 ENCOUNTER — Telehealth: Payer: Self-pay | Admitting: *Deleted

## 2015-06-25 NOTE — Telephone Encounter (Signed)
Ok to cancel -  Take levaquin x 14 days Take probiotic with this Then ensure fu appt

## 2015-06-25 NOTE — Telephone Encounter (Signed)
Bronchoscopy cancelled. Patient notified of Dr. Bari Mantis recommendations and that Bronch has been cancelled. Called pharmacy per patient's request and confirmed that they received Levaquin Rx. Advised pharmacy that patient will be by to pick up today per pt's request. Nothing further needed. Closing encounter

## 2015-06-25 NOTE — Telephone Encounter (Signed)
Notes Recorded by Rigoberto Noel, MD on 06/24/2015 at 12:27 PM Sputum cx shows moraxella - start levaquin to take x 14 days Can still proceed with bscopy if she is not feeling any better --  Called spoke with pt. Aware of results. She reports since her sputum CX did show moraxella, she wants to know if she can cancel her bronch? She reports she is feeling the same but no worse. She reports she was under the impression if her sputum did show something no bronch was needed. Pt wants a call back ASAP today. Also on 11/16 ABX levaquin was already called in Please advise Dr. Elsworth Soho thanks

## 2015-06-26 ENCOUNTER — Encounter (HOSPITAL_COMMUNITY): Payer: Medicare Other

## 2015-06-26 ENCOUNTER — Ambulatory Visit (HOSPITAL_COMMUNITY): Admission: RE | Admit: 2015-06-26 | Payer: Medicare Other | Source: Ambulatory Visit | Admitting: Pulmonary Disease

## 2015-06-26 ENCOUNTER — Encounter (HOSPITAL_COMMUNITY): Admission: RE | Payer: Self-pay | Source: Ambulatory Visit

## 2015-06-26 SURGERY — BRONCHOSCOPY, WITH FLUOROSCOPY
Anesthesia: Moderate Sedation | Laterality: Bilateral

## 2015-07-08 ENCOUNTER — Ambulatory Visit (INDEPENDENT_AMBULATORY_CARE_PROVIDER_SITE_OTHER): Payer: Medicare Other | Admitting: Adult Health

## 2015-07-08 ENCOUNTER — Ambulatory Visit (INDEPENDENT_AMBULATORY_CARE_PROVIDER_SITE_OTHER)
Admission: RE | Admit: 2015-07-08 | Discharge: 2015-07-08 | Disposition: A | Discharge status: Home / Self Care | Payer: Medicare Other | Source: Ambulatory Visit | Attending: Adult Health | Admitting: Adult Health

## 2015-07-08 ENCOUNTER — Encounter: Payer: Self-pay | Admitting: Adult Health

## 2015-07-08 ENCOUNTER — Other Ambulatory Visit: Payer: Medicare Other

## 2015-07-08 ENCOUNTER — Telehealth: Payer: Self-pay | Admitting: Pulmonary Disease

## 2015-07-08 ENCOUNTER — Ambulatory Visit: Payer: Medicare Other | Admitting: Oncology

## 2015-07-08 ENCOUNTER — Other Ambulatory Visit: Payer: Self-pay | Admitting: *Deleted

## 2015-07-08 VITALS — BP 122/64 | HR 77 | Temp 98.7°F | Ht 66.0 in | Wt 258.0 lb

## 2015-07-08 DIAGNOSIS — J189 Pneumonia, unspecified organism: Secondary | ICD-10-CM

## 2015-07-08 DIAGNOSIS — R0600 Dyspnea, unspecified: Secondary | ICD-10-CM | POA: Diagnosis not present

## 2015-07-08 DIAGNOSIS — C880 Waldenstrom macroglobulinemia: Secondary | ICD-10-CM

## 2015-07-08 DIAGNOSIS — J449 Chronic obstructive pulmonary disease, unspecified: Secondary | ICD-10-CM | POA: Diagnosis not present

## 2015-07-08 DIAGNOSIS — D649 Anemia, unspecified: Secondary | ICD-10-CM

## 2015-07-08 NOTE — Addendum Note (Signed)
Addended by: Osa Craver on: 07/08/2015 09:46 AM   Modules accepted: Orders

## 2015-07-08 NOTE — Assessment & Plan Note (Signed)
Slow to resolve PNA with +M. Cat isolated on sputum cx.  Pt is to finish Levaquin (total 14 d )  She has had clinical improvement , check cxr today .  Hold on additonal abx for now .  ? Vol overload with her not taking lasix , check bnp and restart lasix   Plan  Finish Levaquin as directed.  Restart Lasix 20mg  daily .  Low salt diet  Chest xray and labs today  follow up Dr. Elsworth Soho  In 1 month as planned and As needed   Please contact office for sooner follow up if symptoms do not improve or worsen or seek emergency care

## 2015-07-08 NOTE — Progress Notes (Signed)
Subjective:    Patient ID: Natasha Ellis, female    DOB: 1930-11-24, 81 y.o.   MRN: [id number]  HPI  81  year old female. With hx of recurrent pneumonia, chronic cough  and abnormal imaging studies - referred by Dr.Sherill Has  OSA -compliant with CPAP 8 cm   - Low grage B cell diagnosed 2004. S/p chemo 2011 at [facility]. 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 Treated with multiple systemic therapies, Last 12/2014   07/08/2015 Follow up : Pneumonia /cough  Pt was seen 2 weeks ago for persistent cough. She was treated for left lower lobe pneumonia in 03/2015 with Levaquin for 7 days. Since then she has not felt right and reports daily cough and fevers.   CT chest in/10/16 showed bilateral lower lobe consolidation. She was again treated with an antibiotic for this Follow-up chest x-ray on 05/21/15 showed residual atelectasis at the left lung base and increased interstitial markings. Chest x-ray 06/19/15 shows persistent left basilar atelectasis  She has a 25 pack-year smoking history.  Sputum cx was sent on 11/16 that was positive for Moraxella Cat. She was tx w/ 14 days of Levaquin.  She was recommended for Bronchoscopy however this was cancelled after sputum cx returned positive.  Says her cough and congestion are better . Low grades fever have improved Has 2 days left of Levaquin.  No chest pain, hemoptysis, discolored mucus, n/v/d.  Complains of increased DOE for last 2 weeks. Gets winded easily .  Not taking Lasix on regular basis for last 2 weeks.  Last echo in 2014 with nml EF , GR 1 DD .      Past Medical History  Diagnosis Date  . 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 Hypertension   . Arthritis   . Hypercholesteremia   . IBS (irritable bowel syndrome)   . Diverticulosis of intestine     H/O  . DJD (degenerative joint disease) of lumbar spine   . Left knee DJD   . Right knee DJD   . Chronic  cough   . Asthma   . Cataract   . Sleep apnea   . Cancer [facility])     Lymphoma     Past Surgical History  Procedure Laterality Date  . Tonsillectomy and adenoidectomy  childhood  . Abdominal hysterectomy  1984    TAH  . Abdominal hysterectomy      Allergies  Allergen Reactions  . Codeine Other (See Comments)    Crazy-nightmares  . Amoxicillin Diarrhea  . Erythromycin Other (See Comments)    GI upset  . Lisinopril Cough  . Lisinopril     unknown  . Oxybutynin   . Sulfa Antibiotics Other (See Comments)    GI upset    Social History   Social History  . Marital Status: Widowed    Spouse Name: N/A  . Number of Children: N/A  . Years of Education: N/A   Occupational History  . Not on file.   Social History Main Topics  . Smoking status: Former Smoker -- 1.00 packs/day for 25 years    Types: Cigarettes    Quit date: 08/04/1979  . Smokeless tobacco: Never Used  . Alcohol Use: No  . Drug Use: No  . Sexual Activity: Not on file   Other Topics Concern  . Not on file   Social History Narrative   ** Merged History Encounter **  Resides at [place] History  Problem Relation Age of Onset  . Stroke Father       Review of Systems neg for any significant sore throat, dysphagia, itching, sneezing, nasal congestion or excess/ purulent secretions,  sweats, unintended wt loss, pleuritic or exertional cp, hempoptysis, orthopnea pnd or change in chronic leg swelling. Also denies presyncope, palpitations, heartburn, abdominal pain, nausea, vomiting, diarrhea or change in bowel or urinary habits, dysuria,hematuria, rash, arthralgias, visual complaints, headache, numbness weakness or ataxia.     Objective:   Physical Exam  Gen. Pleasant, obese, in no distress, normal affect ENT - no lesions, no post nasal drip, class 2-3 airway Neck: No JVD, no thyromegaly, no carotid bruits Lungs: no use of accessory muscles, no dullness to percussion, decreased  without rales, faint scattered rhonchi  Cardiovascular: Rhythm regular, heart sounds  normal, no murmurs or gallops, tr -1+ peripheral edema Abdomen: soft and non-tender, no hepatosplenomegaly, BS normal. Musculoskeletal: No deformities, no cyanosis or clubbing Neuro:  alert, non focal, no tremors       Assessment & Plan:

## 2015-07-08 NOTE — Patient Instructions (Signed)
Finish Levaquin as directed.  Restart Lasix 20mg  daily .  Low salt diet  Chest xray and labs today  follow up Dr. Elsworth Soho  In 1 month as planned and As needed   Please contact office for sooner follow up if symptoms do not improve or worsen or seek emergency care

## 2015-07-08 NOTE — Telephone Encounter (Signed)
Patient was scheduled for consult with RA on 08/14/15 at 2pm. Patient stated at ov with TP that she requested it not be canceled and in a telephone note on 06/11/15.  Maurice March, RN at 06/11/2015 9:28 AM     Status: Signed       Expand All Collapse All   Called and spoke to pt. Pt has an upcoming appt with TP on 11/16, pt states she is unsure if she wants to change to RA right now. Pt states she will wait till her appt with TP to decide. Pt requesting to keep the January appt with RA until she sees TP. Nothing further needed at this time.          Per TP  Would like patient to be worked into [place] schedule since our office canceled consult after patient requested to keep appointment  Sharyn Lull please advise on appointment day and time

## 2015-07-08 NOTE — Assessment & Plan Note (Signed)
Dyspnea suspect is multifactoral with recent slow to resolve PNA  ? Component of decompensated Diastolic dysfunction off laisx   Plan  Restart Lasix 20mg  daily .  Low salt diet  Chest xray and labs today  follow up Dr. Elsworth Soho  In 1 month as planned and As needed   Please contact office for sooner follow up if symptoms do not improve or worsen or seek emergency care

## 2015-07-09 ENCOUNTER — Other Ambulatory Visit: Payer: Self-pay | Admitting: *Deleted

## 2015-07-09 ENCOUNTER — Ambulatory Visit (HOSPITAL_BASED_OUTPATIENT_CLINIC_OR_DEPARTMENT_OTHER): Payer: Medicare Other | Admitting: Oncology

## 2015-07-09 ENCOUNTER — Telehealth: Payer: Self-pay | Admitting: Oncology

## 2015-07-09 ENCOUNTER — Other Ambulatory Visit (HOSPITAL_BASED_OUTPATIENT_CLINIC_OR_DEPARTMENT_OTHER): Payer: Medicare Other

## 2015-07-09 ENCOUNTER — Telehealth: Payer: Self-pay | Admitting: *Deleted

## 2015-07-09 VITALS — BP 116/51 | HR 71 | Temp 98.0°F | Resp 18 | Ht 66.0 in | Wt 258.7 lb

## 2015-07-09 DIAGNOSIS — C880 Waldenstrom macroglobulinemia: Secondary | ICD-10-CM

## 2015-07-09 DIAGNOSIS — J189 Pneumonia, unspecified organism: Secondary | ICD-10-CM | POA: Diagnosis not present

## 2015-07-09 DIAGNOSIS — D649 Anemia, unspecified: Secondary | ICD-10-CM | POA: Diagnosis not present

## 2015-07-09 DIAGNOSIS — D709 Neutropenia, unspecified: Secondary | ICD-10-CM | POA: Diagnosis not present

## 2015-07-09 DIAGNOSIS — R0600 Dyspnea, unspecified: Secondary | ICD-10-CM | POA: Diagnosis not present

## 2015-07-09 LAB — CBC WITH DIFFERENTIAL/PLATELET
BASO%: 1 % (ref 0.0–2.0)
Basophils Absolute: 0 10*3/uL (ref 0.0–0.1)
EOS%: 4 % (ref 0.0–7.0)
Eosinophils Absolute: 0 10*3/uL (ref 0.0–0.5)
HCT: 29.4 % — ABNORMAL LOW (ref 34.8–46.6)
HGB: 9.4 g/dL — ABNORMAL LOW (ref 11.6–15.9)
LYMPH%: 18.2 % (ref 14.0–49.7)
MCH: 24.7 pg — ABNORMAL LOW (ref 25.1–34.0)
MCHC: 32.2 g/dL (ref 31.5–36.0)
MCV: 76.7 fL — ABNORMAL LOW (ref 79.5–101.0)
MONO#: 0.1 10*3/uL (ref 0.1–0.9)
MONO%: 13.4 % (ref 0.0–14.0)
NEUT#: 0.6 10*3/uL — ABNORMAL LOW (ref 1.5–6.5)
NEUT%: 63.4 % (ref 38.4–76.8)
Platelets: 248 10*3/uL (ref 145–400)
RBC: 3.83 10*6/uL (ref 3.70–5.45)
RDW: 19.4 % — ABNORMAL HIGH (ref 11.2–14.5)
WBC: 1 10*3/uL — ABNORMAL LOW (ref 3.9–10.3)
lymph#: 0.2 10*3/uL — ABNORMAL LOW (ref 0.9–3.3)

## 2015-07-09 LAB — TECHNOLOGIST REVIEW

## 2015-07-09 LAB — CHCC SMEAR

## 2015-07-09 MED ORDER — ALBUTEROL SULFATE HFA 108 (90 BASE) MCG/ACT IN AERS: 1.0000 | INHALATION_SPRAY | Freq: Four times a day (QID) | RESPIRATORY_TRACT | Status: DC | PRN

## 2015-07-09 NOTE — Telephone Encounter (Signed)
Pt returned call. Instructed her to return stool cards this week. Informed her blood smear suggests iron deficiency. Pt voiced understanding, she will call office for fever.

## 2015-07-09 NOTE — Telephone Encounter (Signed)
Patient scheduled for Jan 9th at 3:00pm per RA. Patient aware of appointment. Nothing further needed. Closing encounter

## 2015-07-09 NOTE — Progress Notes (Signed)
Natasha Ellis OFFICE PROGRESS NOTE   Diagnosis: Waldenstrm's macroglobulinemia  INTERVAL HISTORY:   Natasha Ellis returns as scheduled. She saw Dr. Elsworth Ellis for evaluation of the cough and dyspnea. A sputum culture returned positive for Moraxella. She is completing a course of Levaquin. She reports the cough has resolved and she has no recurrent fever. She continues to have dyspnea. She developed increased dyspnea and leg swelling when she discontinued Lasix. She resumed Lasix yesterday. She reports 2 episodes of dark loose stool last weekend.  Objective:  Vital signs in last 24 hours:  Blood pressure 116/51, pulse 71, temperature 98 F (36.7 C), temperature source Oral, resp. rate 18, height 5\' 6"  (1.676 m), weight 258 lb 11.2 oz (117.346 kg), SpO2 98 %.    Resp: Inspiratory/expiratory rhonchi at the left posterior base, no respiratory distress Cardio: Regular rate and rhythm GI: No hepatosplenomegaly Vascular: Trace low leg edema bilaterally   Lab Results:  Lab Results  Component Value Date   WBC 1.0* 07/09/2015   HGB 9.4* 07/09/2015   HCT 29.4* 07/09/2015   MCV 76.7* 07/09/2015   PLT 248 07/09/2015   NEUTROABS 0.6* 07/09/2015    Blood smear: White cells are decreased in number with increased numbers of bands neutrophils. No blasts. The polychromasia is not increased. There is red cell clumping and numerous ovalocytes, teardrops, and "cigar "cells.   Imaging:  Dg Chest 2 View  07/08/2015  CLINICAL DATA:  Recurrent pneumonia EXAM: CHEST  2 VIEW COMPARISON:  06/19/2015 FINDINGS: COPD.  Pulmonary hyperinflation. Left lower lobe linear airspace disease shows mild progression and may be atelectasis or less likely pneumonia based on the radiographic appearance. Correlate with symptoms. Negative for pneumonia on the right. No effusion or edema. Negative for mass lesion. Changes of DISH noted in the thoracic spine. IMPRESSION: COPD. Mild progression of linear densities in  the left lung base most likely atelectasis versus pneumonia. Electronically Signed   By: Franchot Gallo M.D.   On: 07/08/2015 10:01    Medications: I have reviewed the patient's current medications.  Assessment/Plan: 1. 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 Treated with multiple systemic therapies includingpentostatin/Cytoxan/rituximab in 2010.   Cycle 1 bendamustine/Rituxan 05/31/2014.  Cycle 2 bendamustine/Rituxan 07/02/2014  Cycle 3 bendamustine/rituximab 07/30/2014  Cycle 4 bendamustine/rituximab 09/10/2014  Cycle 5 bendamustine/rituximab 10/22/2014  Cycle 6 bendamustine/rituximab 12/10/2014 2. Left knee arthritis. Followed by Dr. Wynelle Ellis. 3. Sleep apnea. 4. Anemia. Now microcytic with peripheral blood smear findings consistent with iron deficiency 07/09/2015 5. Question left parotitis 12/12/2013 presenting with left facial and parotid edema. 6. Admission with pneumonia February 2016 7. History of Neutropenia secondary to chemotherapy, now with moderate neutropenia 8. Left lung pneumonia 04/02/2015, sputum culture positive for Moraxella 06/19/2015 9. Low IgG and IgA 10. CT chest 05/13/2015 with bilateral lower lung consolidatio*  Disposition:  [name] is now being followed by Dr. Elsworth Ellis for the respiratory symptoms and lung consolidation. She will complete a course of Levaquin tomorrow. She now has microcytic anemia. The peripheral blood is suggestive of iron deficiency anemia. I asked her to return a set of stool Hemoccult cards later this week. We will make a GI referral if these are positive. She will contact us for symptoms of anemia or recurrent dark stools. She will contact us for a fever.  We will follow-up on the [name]  level from today. [name] will return for an office visit in 6  weeks. She is scheduled for a CBC in 3 weeks.  The neutropenia may be related to infection, polypharmacy, or the Waldenstrm's macroglobulinemia.  Natasha Coder, MD  07/09/2015  11:35 AM

## 2015-07-09 NOTE — Telephone Encounter (Signed)
Left message on voicemail for pt to call office. Neutrophils were low today, call office for fever. Dr. Benay Spice reviewed blood smear, it suggests iron deficiency. Return stool cards this week.

## 2015-07-09 NOTE — Telephone Encounter (Signed)
Gave and printed appt sched and av sfor pt for DEC and Jan 2017 °

## 2015-07-09 NOTE — Telephone Encounter (Signed)
Dr. Elsworth Soho, please advise where I can add patient on schedule.  Thanks.

## 2015-07-09 NOTE — Telephone Encounter (Signed)
Can double book in late dec or jan

## 2015-07-09 NOTE — Telephone Encounter (Signed)
Patient returned Michelle's phone call, may be reached at [phone]

## 2015-07-09 NOTE — Telephone Encounter (Signed)
The January appointment was scheduled on 05/16/15 for a Consult with Dr. Elsworth Soho and was cancelled on 06/17/15 because patient was scheduled to see Dr. Elsworth Soho (and saw Dr. Elsworth Soho) on 06/19/15.  Patient did not have a January appointment when she saw TP on 07/08/15, it had already been cancelled since she saw Dr. Elsworth Soho on 06/19/15.  Pt states that oncologist office drew the bloodwork today that TP wanted her to have drawn and she wanted to go over results with Dr. Elsworth Soho at next [place].  She wants to be seen in January.   Pt understands about the appointment and is not upset about the appointments.  She said she just wants to make sure she gets in with Dr. Elsworth Soho because she is "in love with him and wants to see him"    Dr. Elsworth Soho, can I doublebook you in Jan somewhere? Please advise.

## 2015-07-09 NOTE — Telephone Encounter (Signed)
Spoke with patient, aware that we are working to try and get her rescheduled for an appt with RA in Jan 2017 Pt was scheduled for appt 08/14/15 and it got cancelled. Pt is not happy about this . I explained that it looks like her visit that was scheduled was for a Consult and did not need to be but does not explain why it was all together cancelled and not just rescheduled or changed to an OV. Patient states that she cannot wait until first available(March) and needs to be seen in January.  Please advise. Thanks.

## 2015-07-10 LAB — IGM: IgM, Serum: 1190 mg/dL — ABNORMAL HIGH (ref 52–322)

## 2015-07-10 NOTE — Progress Notes (Signed)
Reviewed & agree with plan  

## 2015-07-11 ENCOUNTER — Telehealth: Payer: Self-pay | Admitting: Adult Health

## 2015-07-11 ENCOUNTER — Ambulatory Visit (HOSPITAL_BASED_OUTPATIENT_CLINIC_OR_DEPARTMENT_OTHER): Payer: Medicare Other

## 2015-07-11 DIAGNOSIS — R195 Other fecal abnormalities: Secondary | ICD-10-CM

## 2015-07-11 DIAGNOSIS — C880 Waldenstrom macroglobulinemia: Secondary | ICD-10-CM

## 2015-07-11 LAB — FECAL OCCULT BLOOD, GUAIAC: Occult Blood: NEGATIVE

## 2015-07-11 NOTE — Telephone Encounter (Signed)
Per TP after reviewing BNP and BMET faxed from [facility] on 07/11/15 Labs are okay  Continue with office recs  LVM for patient to return call.

## 2015-07-12 NOTE — Telephone Encounter (Signed)
Patient Returned call [phone]

## 2015-07-12 NOTE — Telephone Encounter (Signed)
Spoke with pt, aware of results/recs.  Nothing further needed.  

## 2015-07-17 ENCOUNTER — Encounter: Payer: Self-pay | Admitting: Adult Health

## 2015-07-17 ENCOUNTER — Telehealth: Payer: Self-pay | Admitting: *Deleted

## 2015-07-17 DIAGNOSIS — Z961 Presence of intraocular lens: Secondary | ICD-10-CM | POA: Diagnosis not present

## 2015-07-17 NOTE — Telephone Encounter (Signed)
Returned call to pt, informed her of negative stool hemoccult result. Instructed her to begin ferrous sulfate 325 mg BID, per Dr. Benay Spice. She voiced understanding. Asked that we contact [employer] to get albuterol inhalers covered by her insurance. Message left for managed care.

## 2015-07-17 NOTE — Telephone Encounter (Signed)
Voicemail: "I have two questions. 1. I delivered a hemoccult slide Thursday.  I have low iron in my blood.  What were the results? 2. Natasha Ellis ordered a three month supply of albuterol inhalers but insurance doesn't cover this.  I was given the following number for the doctor to call ([employer] [facility] Prior authorization) [phone].  I get medicine for my nebulizer through Medicare part D. 3. Please call me at [phone] but I will not be home from [phone] to [phone]."

## 2015-07-30 ENCOUNTER — Ambulatory Visit (HOSPITAL_COMMUNITY)
Admission: RE | Admit: 2015-07-30 | Discharge: 2015-07-30 | Disposition: A | Discharge status: Home / Self Care | Payer: Medicare Other | Source: Ambulatory Visit | Attending: Nurse Practitioner | Admitting: Nurse Practitioner

## 2015-07-30 ENCOUNTER — Other Ambulatory Visit (HOSPITAL_BASED_OUTPATIENT_CLINIC_OR_DEPARTMENT_OTHER): Payer: Medicare Other

## 2015-07-30 ENCOUNTER — Ambulatory Visit (INDEPENDENT_AMBULATORY_CARE_PROVIDER_SITE_OTHER): Payer: Medicare Other | Admitting: Internal Medicine

## 2015-07-30 ENCOUNTER — Telehealth: Payer: Self-pay | Admitting: *Deleted

## 2015-07-30 ENCOUNTER — Encounter: Payer: Self-pay | Admitting: Internal Medicine

## 2015-07-30 VITALS — BP 140/82 | HR 86 | Temp 97.5°F | Ht 66.0 in | Wt 260.0 lb

## 2015-07-30 DIAGNOSIS — R05 Cough: Secondary | ICD-10-CM | POA: Diagnosis present

## 2015-07-30 DIAGNOSIS — C880 Waldenstrom macroglobulinemia: Secondary | ICD-10-CM

## 2015-07-30 DIAGNOSIS — J849 Interstitial pulmonary disease, unspecified: Secondary | ICD-10-CM | POA: Insufficient documentation

## 2015-07-30 DIAGNOSIS — R509 Fever, unspecified: Secondary | ICD-10-CM

## 2015-07-30 DIAGNOSIS — R059 Cough, unspecified: Secondary | ICD-10-CM

## 2015-07-30 DIAGNOSIS — I517 Cardiomegaly: Secondary | ICD-10-CM | POA: Diagnosis not present

## 2015-07-30 LAB — CBC WITH DIFFERENTIAL/PLATELET
BASO%: 0.3 % (ref 0.0–2.0)
Basophils Absolute: 0 10*3/uL (ref 0.0–0.1)
EOS%: 2.8 % (ref 0.0–7.0)
Eosinophils Absolute: 0.1 10*3/uL (ref 0.0–0.5)
HCT: 27.6 % — ABNORMAL LOW (ref 34.8–46.6)
HGB: 9.9 g/dL — ABNORMAL LOW (ref 11.6–15.9)
LYMPH%: 19.3 % (ref 14.0–49.7)
MCH: 30.7 pg (ref 25.1–34.0)
MCHC: 35.9 g/dL (ref 31.5–36.0)
MCV: 85.4 fL (ref 79.5–101.0)
MONO#: 0.3 10*3/uL (ref 0.1–0.9)
MONO%: 8.3 % (ref 0.0–14.0)
NEUT#: 2.5 10*3/uL (ref 1.5–6.5)
NEUT%: 69.3 % (ref 38.4–76.8)
Platelets: 223 10*3/uL (ref 145–400)
RBC: 3.23 10*6/uL — ABNORMAL LOW (ref 3.70–5.45)
RDW: 20 % — ABNORMAL HIGH (ref 11.2–14.5)
WBC: 3.6 10*3/uL — ABNORMAL LOW (ref 3.9–10.3)
lymph#: 0.7 10*3/uL — ABNORMAL LOW (ref 0.9–3.3)
nRBC: 0 % (ref 0–0)

## 2015-07-30 MED ORDER — CEFDINIR 300 MG PO CAPS: 300.0000 mg | ORAL_CAPSULE | Freq: Two times a day (BID) | ORAL | Status: DC

## 2015-07-30 NOTE — Telephone Encounter (Signed)
Pt reported to [facility] for lab appt only. Pt filled out a walk in form with complaints of fever of 101.8 on Monday evening. Took 2 ibuprofen and fever has resolved without return. Pt reports increase SOB and slight cough.   Pt states this was like when she had pn last month, this was treated by Dr. Elsworth Soho. This nurse called Dr. Bari Mantis office to report return of symptoms. Pt appt made with Dr. Melvyn Novas at noon today- pt to go for a CXR before hand.   Pt advised of appt made. CXR ordered. Pt escorted via wheelchair to radiology to have CXR completed.

## 2015-07-30 NOTE — Progress Notes (Signed)
Subjective:    Patient ID: Natasha Ellis, female    DOB: 1930/12/26,     MRN: [id number]   Brief patient profile:  81  year old female quit smoking 1981 With hx of recurrent pneumonia, chronic cough  and abnormal imaging studies - referred by Dr.Sherill Has  OSA -compliant with CPAP 8 cm   - Low grage B cell diagnosed 2004. S/p chemo 2011 at [place]. 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 Treated with multiple systemic therapies, Last 12/2014    07/08/2015 Follow up : Pneumonia /cough  Pt was seen 2 weeks ago for persistent cough. She was treated for left lower lobe pneumonia in 03/2015 with Levaquin for 7 days. Since then she has not felt right and reports daily cough and fevers.   CT chest in 10/16 showed bilateral lower lobe consolidation. She was again treated with an antibiotic for this Follow-up chest x-ray on 05/21/15 showed residual atelectasis at the left lung base and increased interstitial markings. Chest x-ray 06/19/15 shows persistent left basilar atelectasis She has a 25 pack-year smoking history.  Sputum cx was sent on 11/16 that was positive for Moraxella Cat. She was tx w/ 14 days of Levaquin.  She was recommended for Bronchoscopy however this was cancelled after sputum cx returned positive.  Says her cough and congestion are better . Low grades fever have improved Has 2 days left of Levaquin.  Complains of increased DOE for last 2 weeks. Gets winded easily .  Not taking Lasix on regular basis for last 2 weeks.  Last echo in 2014 with nml EF , GR 1 DD .  rec Finish Levaquin as directed.  Restart Lasix 20mg  daily .  Low salt diet    07/30/2015 acute extended ov/[facility]ert re: recurrent fever   Chief Complaint  Patient presents with  . Acute Visit    pt c/o productive cough with yellow mucus, increased SOB with exertion, recent fever of 101.8 x 1 day. Pt states having cxr before appt     Reports cough x 15  years some  better p levaquin complete and no worse now  despite new fever x 24 h with chills but no new sputum, no cp, sorethroat/ha/gi or gu complaints or sob  No obvious day to day or daytime variability or assoc  chest tightness, subjective wheeze or overt   hb symptoms. No unusual exp hx or h/o childhood pna/ asthma or knowledge of premature birth.  Sleeping ok without nocturnal  or early am exacerbation  of respiratory  c/o's or need for noct saba. Also denies any obvious fluctuation of symptoms with weather or environmental changes or other aggravating or alleviating factors except as outlined above   Current Medications, Allergies, Complete Past Medical History, Past Surgical History, Family History, and Social History were reviewed in [employer] record.  ROS  The following are not active complaints unless bolded sore throat, dysphagia, dental problems, itching, sneezing,  nasal congestion or excess/ purulent secretions, ear ache,   fever, chills, sweats, unintended wt loss, classically pleuritic or exertional cp, hemoptysis,  orthopnea pnd or leg swelling, presyncope, palpitations, abdominal pain, anorexia, nausea, vomiting, diarrhea  or change in bowel or bladder habits, change in stools or urine, dysuria,hematuria,  rash, arthralgias, visual complaints, headache, numbness, weakness or ataxia or problems with walking or coordination,  change in mood/affect or memory.  Objective:   Physical Exam  Gen. Pleasant, obese, in no distress, normal affect/ walks with cane but not able to get up on exam table due to wt   Wt Readings from Last 3 Encounters:  07/30/15 260 lb (117.935 kg)  07/09/15 258 lb 11.2 oz (117.346 kg)  07/08/15 258 lb (117.028 kg)    Vital signs reviewed    ENT - no lesions, no post nasal drip, class 2-3 airway Neck: No JVD, no thyromegaly, no carotid bruits Lungs: no use of accessory muscles, no dullness to percussion, decreased  without rales, min insp pops in a bases without bronchial changes  Cardiovascular: Rhythm regular, heart sounds  normal, no murmurs or gallops, tr bilateral   peripheral edema Abdomen: soft and non-tender, no hepatosplenomegaly, BS normal. Musculoskeletal: No deformities, no cyanosis or clubbing Neuro:  alert, non focal, no tremors   CXR PA and Lateral:   07/30/2015 :    I personally reviewed images and agree with radiology impression as follows:   1. Bilateral interstitial prominence, most likely secondary to chronic interstitial lung disease. Mild pneumonitis cannot be excluded. 2. Mild stable cardiomegaly.        Assessment & Plan:

## 2015-07-30 NOTE — Patient Instructions (Addendum)
omnicef 300 mg twice daily x 10 days   Keep appt with Dr Elsworth Soho ? Need sinus CT next step

## 2015-07-30 NOTE — Assessment & Plan Note (Signed)
Acute in pt with chronic cough with no new as dz or symptoms to direct therapy at this point and certainly no indication to restart levaquin at this point .  rec trial of omnicef 300 mg bid x 10 days then ? Sinus ct as already has a chronic am cough x years typical of uacs and has not had sinuses explored as a potential source

## 2015-07-30 NOTE — Assessment & Plan Note (Signed)
Body mass index is 41.99    Lab Results  Component Value Date   TSH 1.047 09/20/2014     Contributing to gerd tendency/ doe/reviewed the need and the process to achieve and maintain neg calorie balance > defer f/u primary care including intermittently monitoring thyroid status

## 2015-07-31 ENCOUNTER — Telehealth: Payer: Self-pay | Admitting: *Deleted

## 2015-07-31 NOTE — Telephone Encounter (Signed)
-----   Message from Ladell Pier, MD sent at 07/30/2015  5:35 PM EST ----- Please call patient, neutrophils are now normal

## 2015-07-31 NOTE — Telephone Encounter (Signed)
Per Dr. Benay Spice; notified pt that neutrophils are now normal.  Pt verbalized understanding.

## 2015-08-01 LAB — AFB CULTURE WITH SMEAR (NOT AT ARMC): Acid Fast Smear: NONE SEEN

## 2015-08-12 ENCOUNTER — Encounter: Payer: Self-pay | Admitting: Pulmonary Disease

## 2015-08-12 ENCOUNTER — Ambulatory Visit (INDEPENDENT_AMBULATORY_CARE_PROVIDER_SITE_OTHER): Payer: Medicare Other | Admitting: Pulmonary Disease

## 2015-08-12 VITALS — BP 122/64 | HR 82 | Ht 67.0 in | Wt 259.6 lb

## 2015-08-12 DIAGNOSIS — G4733 Obstructive sleep apnea (adult) (pediatric): Secondary | ICD-10-CM

## 2015-08-12 DIAGNOSIS — J189 Pneumonia, unspecified organism: Secondary | ICD-10-CM

## 2015-08-12 NOTE — Assessment & Plan Note (Addendum)
We'll assess for usual suspects-sinuses and GERD as cause of her chronic cough. Acute worsening with yellow sputum could be viral Try to hold off on antibiotics -she has received too many antibiotics over the past few months Take Mucinex 600 twice daily Take DELSYM cough syrup at night to suppress CT scan of sinuses  Call if worse

## 2015-08-12 NOTE — Patient Instructions (Signed)
CPAP supplies will be renewed - [employer] Try to hold off on antibiotics Take Mucinex 600 twice daily Take DELSYM cough syrup at night to suppress CT scan of sinuses  Call if worse

## 2015-08-12 NOTE — Assessment & Plan Note (Signed)
CPAP supplies will be renewed - [employer]

## 2015-08-12 NOTE — Progress Notes (Signed)
   Subjective:    Patient ID: Natasha Ellis, female    DOB: 11/29/1930, 82 y.o.   MRN: [id number]  HPI  82  year old female. With hx of recurrent pneumonia, chronic cough   - referred by Dr.Sherill Has  OSA -compliant with CPAP 8 cm CHronic cough x 11 years.   Also, dyspnea. Insidious onet. Few to several months. Noticed it after oxygen was discontinued in sept 2013 when she moved from [place] to [place]. Was using only nocutrnal oxygen.  She was seeing Court Joy in [place].  - Low grage B cell diagnosed 2004. S/p chemo 2011 at [facility]. 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 Treated with multiple systemic therapies, Last 12/2014    She has a 25 pack-year smoking history.    08/12/2015  Chief Complaint  Patient presents with  . Follow-up    stomach virus last night, not doing well.  Terrible cough since starting Omnicef.  yellow mucus.     Accompanied by daughter  Sputum cx was sent on 11/16 that was positive for Moraxella Cat. She was tx w/ 14 days of Levaquin. She was recommended for Bronchoscopy however this was cancelled after sputum cx returned positive.  She improved for a few weeks and then got sick again after Christmas Saw MW on 12/27  - omnicef x 10ds -she feels like this made her coughing worse and she cut the course short after 7 days. She now reports occasional yellow sputum-she has 2 kinds of cough productive kind, and also a milder throat clearing kind of dry cough  She also feels she has a stomach bug x 1 day with loose stools She has not changed CPAP supplies for about a year- DME is [employer]  CPAP download 05/2016 shows excellent compliance , average pressure 10 cm on auto 8-15 cm , residue AHI of 4.5/hour mild leak   Significant tests/ events  04/2013 head CT - Sinus disease with mucosal thickening involving all sinus cavities. echo in 2014 with nml EF , GR 1 DD   05/2015 CT chest  showed  bilateral lower lobe consolidation  Review of Systems neg for any significant sore throat, dysphagia, itching, sneezing, nasal congestion or excess/ purulent secretions, fever, chills, sweats, unintended wt loss, pleuritic or exertional cp, hempoptysis, orthopnea pnd or change in chronic leg swelling. Also denies presyncope, palpitations, heartburn, abdominal pain, nausea, vomiting, diarrhea or change in bowel or urinary habits, dysuria,hematuria, rash, arthralgias, visual complaints, headache, numbness weakness or ataxia.     Objective:   Physical Exam  Gen. Pleasant, obese, in no distress ENT - no lesions, no post nasal drip Neck: No JVD, no thyromegaly, no carotid bruits Lungs: no use of accessory muscles, no dullness to percussion, decreased without rales or rhonchi  Cardiovascular: Rhythm regular, heart sounds  normal, no murmurs or gallops, no peripheral edema Musculoskeletal: No deformities, no cyanosis or clubbing , no tremors       Assessment & Plan:

## 2015-08-14 ENCOUNTER — Institutional Professional Consult (permissible substitution): Payer: Medicare Other | Admitting: Pulmonary Disease

## 2015-08-16 ENCOUNTER — Encounter: Payer: Self-pay | Admitting: Pulmonary Disease

## 2015-08-16 ENCOUNTER — Ambulatory Visit (INDEPENDENT_AMBULATORY_CARE_PROVIDER_SITE_OTHER)
Admission: RE | Admit: 2015-08-16 | Discharge: 2015-08-16 | Disposition: A | Discharge status: Home / Self Care | Payer: Medicare Other | Source: Ambulatory Visit | Attending: Pulmonary Disease | Admitting: Pulmonary Disease

## 2015-08-16 DIAGNOSIS — J3489 Other specified disorders of nose and nasal sinuses: Secondary | ICD-10-CM | POA: Diagnosis not present

## 2015-08-16 DIAGNOSIS — J189 Pneumonia, unspecified organism: Secondary | ICD-10-CM

## 2015-08-21 ENCOUNTER — Other Ambulatory Visit: Payer: Self-pay | Admitting: Pulmonary Disease

## 2015-08-21 DIAGNOSIS — R930 Abnormal findings on diagnostic imaging of skull and head, not elsewhere classified: Secondary | ICD-10-CM

## 2015-08-21 NOTE — Progress Notes (Signed)
Notes Recorded by Rigoberto Noel, MD on 08/19/2015 at 8:23 PM She has extensive sinus disease ENT consult

## 2015-08-23 DIAGNOSIS — Z9989 Dependence on other enabling machines and devices: Secondary | ICD-10-CM | POA: Diagnosis not present

## 2015-08-23 DIAGNOSIS — H903 Sensorineural hearing loss, bilateral: Secondary | ICD-10-CM | POA: Diagnosis not present

## 2015-08-23 DIAGNOSIS — R05 Cough: Secondary | ICD-10-CM | POA: Diagnosis not present

## 2015-08-23 DIAGNOSIS — H7203 Central perforation of tympanic membrane, bilateral: Secondary | ICD-10-CM | POA: Diagnosis not present

## 2015-08-23 DIAGNOSIS — G4733 Obstructive sleep apnea (adult) (pediatric): Secondary | ICD-10-CM | POA: Diagnosis not present

## 2015-08-23 DIAGNOSIS — J324 Chronic pansinusitis: Secondary | ICD-10-CM | POA: Diagnosis not present

## 2015-08-23 DIAGNOSIS — Z974 Presence of external hearing-aid: Secondary | ICD-10-CM | POA: Diagnosis not present

## 2015-08-26 ENCOUNTER — Ambulatory Visit: Payer: Medicare Other

## 2015-08-26 ENCOUNTER — Telehealth: Payer: Self-pay | Admitting: Nurse Practitioner

## 2015-08-26 ENCOUNTER — Ambulatory Visit (HOSPITAL_BASED_OUTPATIENT_CLINIC_OR_DEPARTMENT_OTHER): Payer: Medicare Other | Admitting: Oncology

## 2015-08-26 ENCOUNTER — Other Ambulatory Visit (HOSPITAL_BASED_OUTPATIENT_CLINIC_OR_DEPARTMENT_OTHER): Payer: Medicare Other

## 2015-08-26 VITALS — BP 155/54 | HR 75 | Temp 98.4°F | Resp 18 | Ht 67.0 in | Wt 262.1 lb

## 2015-08-26 DIAGNOSIS — C880 Waldenstrom macroglobulinemia: Secondary | ICD-10-CM

## 2015-08-26 DIAGNOSIS — C8580 Other specified types of non-Hodgkin lymphoma, unspecified site: Secondary | ICD-10-CM

## 2015-08-26 DIAGNOSIS — D619 Aplastic anemia, unspecified: Secondary | ICD-10-CM

## 2015-08-26 DIAGNOSIS — D539 Nutritional anemia, unspecified: Secondary | ICD-10-CM

## 2015-08-26 DIAGNOSIS — D649 Anemia, unspecified: Secondary | ICD-10-CM

## 2015-08-26 LAB — IRON AND TIBC
%SAT: 22 % (ref 21–57)
Iron: 44 ug/dL (ref 41–142)
TIBC: 201 ug/dL — ABNORMAL LOW (ref 236–444)
UIBC: 157 ug/dL (ref 120–384)

## 2015-08-26 LAB — FERRITIN: Ferritin: 325 ng/ml — ABNORMAL HIGH (ref 9–269)

## 2015-08-26 LAB — CBC WITH DIFFERENTIAL/PLATELET
BASO%: 0.9 % (ref 0.0–2.0)
Basophils Absolute: 0 10*3/uL (ref 0.0–0.1)
EOS%: 3.7 % (ref 0.0–7.0)
Eosinophils Absolute: 0.1 10*3/uL (ref 0.0–0.5)
HCT: 30.5 % — ABNORMAL LOW (ref 34.8–46.6)
HGB: 10 g/dL — ABNORMAL LOW (ref 11.6–15.9)
LYMPH%: 18.2 % (ref 14.0–49.7)
MCH: 25.4 pg (ref 25.1–34.0)
MCHC: 32.7 g/dL (ref 31.5–36.0)
MCV: 77.7 fL — ABNORMAL LOW (ref 79.5–101.0)
MONO#: 0.3 10*3/uL (ref 0.1–0.9)
MONO%: 11.6 % (ref 0.0–14.0)
NEUT#: 1.7 10*3/uL (ref 1.5–6.5)
NEUT%: 65.6 % (ref 38.4–76.8)
Platelets: 253 10*3/uL (ref 145–400)
RBC: 3.93 10*6/uL (ref 3.70–5.45)
RDW: 20.7 % — ABNORMAL HIGH (ref 11.2–14.5)
WBC: 2.6 10*3/uL — ABNORMAL LOW (ref 3.9–10.3)
lymph#: 0.5 10*3/uL — ABNORMAL LOW (ref 0.9–3.3)

## 2015-08-26 LAB — BASIC METABOLIC PANEL
Anion Gap: 9 mEq/L (ref 3–11)
BUN: 11.2 mg/dL (ref 7.0–26.0)
CO2: 25 mEq/L (ref 22–29)
Calcium: 9 mg/dL (ref 8.4–10.4)
Chloride: 109 mEq/L (ref 98–109)
Creatinine: 0.9 mg/dL (ref 0.6–1.1)
EGFR: 62 mL/min/{1.73_m2} — ABNORMAL LOW (ref >90)
Glucose: 91 mg/dl (ref 70–140)
Potassium: 4.1 mEq/L (ref 3.5–5.1)
Sodium: 143 mEq/L (ref 136–145)

## 2015-08-26 LAB — TECHNOLOGIST REVIEW

## 2015-08-26 LAB — CHCC SMEAR

## 2015-08-26 NOTE — Progress Notes (Signed)
  [facility] OFFICE PROGRESS NOTE   Diagnosis: Waldenstrm's macroglobulinemia  INTERVAL HISTORY:   [name] returns as scheduled. She is followed by Natasha Ellis for evaluation of the chronic cough. The cough is persistent. She had a CT of the sinuses 08/16/2015. This revealed mucosal thickening and opacification of multiple sinuses. She was referred to Natasha Ellis and reports he recommended a course of antibiotics versus surgery. No fever.  Objective:  Vital signs in last 24 hours:  Blood pressure 155/54, pulse 75, temperature 98.4 F (36.9 C), temperature source Oral, resp. rate 18, height 5\' 7"  (1.702 m), weight 262 lb 1.6 oz (118.888 kg), SpO2 97 %.    HEENT: No sinus tenderness, neck without mass Lymphatics: No cervical, supraclavicular, or axillary nodes Resp: Bilateral expiratory rhonchi, no respiratory distress Cardio: Regular rate and rhythm GI: No hepato-splenomegaly Vascular: No leg edema  Lab Results:  Lab Results  Component Value Date   WBC 2.6* 08/26/2015   HGB 10.0* 08/26/2015   HCT 30.5* 08/26/2015   MCV 77.7* 08/26/2015   PLT 253 08/26/2015   NEUTROABS 1.7 08/26/2015   [name] on 07/09/2015-1190  Ferritin 325, iron 44, TIBC 201, percent transferrin saturation-22  Medications: I have reviewed the patient's current medications.  Assessment/Plan: 1. 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 Treated with multiple systemic therapies includingpentostatin/Cytoxan/rituximab in 2010.   Cycle 1 bendamustine/Rituxan 05/31/2014.  Cycle 2 bendamustine/Rituxan 07/02/2014  Cycle 3 bendamustine/rituximab 07/30/2014  Cycle 4 bendamustine/rituximab 09/10/2014  Cycle 5 bendamustine/rituximab 10/22/2014  Cycle 6 bendamustine/rituximab 12/10/2014 2. Left knee arthritis.  Followed by Natasha Ellis. 3. Sleep apnea. 4. Anemia. Now microcytic with peripheral blood smear findings consistent with iron deficiency 07/09/2015, serum iron studies consistent with "anemia of chronic disease ", stool Hemoccult cards negative 07/11/2015 5. Question left parotitis 12/12/2013 presenting with left facial and parotid edema. 6. Admission with pneumonia February 2016 7. History of Neutropenia secondary to chemotherapy, now with moderate neutropenia 8. Left lung pneumonia 04/02/2015, sputum culture positive for Moraxella 06/19/2015 9. Low IgG and IgA 10. CT chest 05/13/2015 with bilateral lower lung consolidation 11. Sinus mucosal thickening/opacification on CT 08/16/2015   Disposition:  Natasha Ellis is stable from a hematologic standpoint. The anemia is likely secondary to "chronic disease "or possibly iron deficiency. She reports developing loose stools when she increases the iron to more than once daily.  There is no clinical evidence for progression of the Waldenstrm's macroglobulinemia.  I recommended she proceed with antibiotics if recommended by Natasha Ellis for treatment of the sinus disease.  She will return for an office and lab visit here in 2 months.  Natasha Coder, MD  08/26/2015  10:51 AM

## 2015-08-26 NOTE — Telephone Encounter (Signed)
per po to sch pt appt-gave pt copy of avs-sent pt back ot lab per pof

## 2015-08-27 ENCOUNTER — Telehealth: Payer: Self-pay | Admitting: Pulmonary Disease

## 2015-08-27 LAB — IGM: IgM, Qn, Serum: 1494 mg/dL — ABNORMAL HIGH (ref 26–217)

## 2015-08-27 NOTE — Telephone Encounter (Signed)
Spoke with pt. Stated that we waited to long to call her back. She has already made her decision on what to do about surgery or taking antibiotics. Nothing further was needed at this time.

## 2015-09-16 DIAGNOSIS — L821 Other seborrheic keratosis: Secondary | ICD-10-CM | POA: Diagnosis not present

## 2015-09-16 DIAGNOSIS — L570 Actinic keratosis: Secondary | ICD-10-CM | POA: Diagnosis not present

## 2015-09-16 DIAGNOSIS — L308 Other specified dermatitis: Secondary | ICD-10-CM | POA: Diagnosis not present

## 2015-10-07 DIAGNOSIS — J324 Chronic pansinusitis: Secondary | ICD-10-CM | POA: Diagnosis not present

## 2015-10-07 DIAGNOSIS — J329 Chronic sinusitis, unspecified: Secondary | ICD-10-CM | POA: Diagnosis not present

## 2015-10-22 ENCOUNTER — Encounter: Payer: Self-pay | Admitting: Pulmonary Disease

## 2015-10-22 ENCOUNTER — Ambulatory Visit (INDEPENDENT_AMBULATORY_CARE_PROVIDER_SITE_OTHER): Payer: Medicare Other | Admitting: Pulmonary Disease

## 2015-10-22 VITALS — BP 142/76 | HR 67 | Ht 66.0 in | Wt 263.0 lb

## 2015-10-22 DIAGNOSIS — J189 Pneumonia, unspecified organism: Secondary | ICD-10-CM | POA: Diagnosis not present

## 2015-10-22 DIAGNOSIS — G4733 Obstructive sleep apnea (adult) (pediatric): Secondary | ICD-10-CM | POA: Diagnosis not present

## 2015-10-22 NOTE — Patient Instructions (Addendum)
OK to proceed with sinus surgery  Take mucinex daytime 600 twice daily & delsym at night  CPAP settings are good We can get you new humidifier if problem persists after surgery  Check oxygen level on CPAP/RA Call if worse

## 2015-10-22 NOTE — Progress Notes (Signed)
   Subjective:    Patient ID: Natasha Ellis, female    DOB: 1931-02-01, 81 y.o.   MRN: [id number]  HPI  82  year old female. With hx of recurrent pneumonia, chronic cough   - referred by Dr.Sherill Has  OSA -compliant with CPAP 8 cm CHronic cough x 11 years.   - Low grage B cell diagnosed 2004. S/p chemo 2011 at [facility]. 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 Treated with multiple systemic therapies, Last 12/2014    She has a 25 pack-year smoking history -Quit in 1981 Was using only nocutrnal oxygen.  -this was discontinued in sept 2013 when she moved from [place] to [place]. She was seeing Court Joy in [place].   10/22/2015  Chief Complaint  Patient presents with  . Follow-up    patient doing well on CPAP; wears it every night; unable to download information from card, dated back to 08/13/15.  Patient did not have card pushed all the way into machine, so it did not download information.  would like new machine with larger reservoir to hold water.  having to get up in the middle of the night to refill her reservoir.  30m FU  Accompanied by sister from [place]  Sputum cx on 11/16 -positive for Moraxella Cat. She was tx w/ 14 days of Levaquin.  She improved for a few weeks and then got sick again after Christmas Saw MW on 12/27  - omnicef x 10ds   Then saw [name] , ENT - extensive sinusitis on CT - took clinda x 1 month - persisted -advised surgery  Got new CPAP supplies - DME is [facility]  CPAP download 10/2015 shows excellent compliance , average pressure 11 cm on auto 8-15 cm , residue AHI of 5/hour  leak ++  Significant tests/ events  04/2013 head CT - Sinus disease with mucosal thickening involving all sinus cavities. echo in 2014 with nml EF , GR 1 DD   08/2015 CT sinus >> Extensive multifocal paranasal sinus disease, most marked in the ethmoid air cell complexes and in the left maxillary antrum  05/2015 CT  chest  showed bilateral lower lobe consolidation  Past Medical History  Diagnosis Date  . Malignant lymphoplasmacytic lymphoma ([facility]) 2004  . Hypertension   . Arthritis   . Hypercholesteremia   . IBS (irritable bowel syndrome)   . Diverticulosis of intestine     H/O  . DJD (degenerative joint disease) of lumbar spine   . Left knee DJD   . Right knee DJD   . Chronic cough   . Asthma   . Cataract   . Sleep apnea   . Cancer [facility])     Lymphoma     Review of Systems Patient denies significant dyspnea,cough, hemoptysis,  chest pain, palpitations, pedal edema, orthopnea, paroxysmal nocturnal dyspnea, lightheadedness, nausea, vomiting, abdominal or  leg pains      Objective:   Physical Exam  Gen. Pleasant, well-nourished, in no distress ENT - Nasal voice, no post nasal drip Neck: No JVD, no thyromegaly, no carotid bruits Lungs: no use of accessory muscles, no dullness to percussion, clear without rales or rhonchi  Cardiovascular: Rhythm regular, heart sounds  normal, no murmurs or gallops, no peripheral edema Musculoskeletal: No deformities, no cyanosis or clubbing         Assessment & Plan:

## 2015-10-22 NOTE — Assessment & Plan Note (Signed)
OK to proceed with sinus surgery  Take mucinex daytime 600 twice daily & delsym at night

## 2015-10-22 NOTE — Assessment & Plan Note (Signed)
CPAP settings are good We can get you new humidifier if problem persists after surgery  Check oxygen level on CPAP/RA Call if worse

## 2015-10-24 ENCOUNTER — Ambulatory Visit: Payer: Medicare Other

## 2015-10-24 ENCOUNTER — Telehealth: Payer: Self-pay | Admitting: Oncology

## 2015-10-24 ENCOUNTER — Other Ambulatory Visit (HOSPITAL_BASED_OUTPATIENT_CLINIC_OR_DEPARTMENT_OTHER): Payer: Medicare Other

## 2015-10-24 ENCOUNTER — Ambulatory Visit (HOSPITAL_BASED_OUTPATIENT_CLINIC_OR_DEPARTMENT_OTHER): Payer: Medicare Other | Admitting: Nurse Practitioner

## 2015-10-24 VITALS — BP 150/54 | HR 79 | Temp 98.3°F | Resp 17 | Ht 66.0 in | Wt 260.8 lb

## 2015-10-24 DIAGNOSIS — D619 Aplastic anemia, unspecified: Secondary | ICD-10-CM

## 2015-10-24 DIAGNOSIS — C9110 Chronic lymphocytic leukemia of B-cell type not having achieved remission: Secondary | ICD-10-CM

## 2015-10-24 DIAGNOSIS — D539 Nutritional anemia, unspecified: Secondary | ICD-10-CM

## 2015-10-24 DIAGNOSIS — Z23 Encounter for immunization: Secondary | ICD-10-CM | POA: Diagnosis not present

## 2015-10-24 DIAGNOSIS — C880 Waldenstrom macroglobulinemia: Secondary | ICD-10-CM

## 2015-10-24 LAB — BASIC METABOLIC PANEL
Anion Gap: 9 mEq/L (ref 3–11)
BUN: 10.5 mg/dL (ref 7.0–26.0)
CO2: 26 mEq/L (ref 22–29)
Calcium: 9.2 mg/dL (ref 8.4–10.4)
Chloride: 110 mEq/L — ABNORMAL HIGH (ref 98–109)
Creatinine: 0.9 mg/dL (ref 0.6–1.1)
EGFR: 63 mL/min/{1.73_m2} — ABNORMAL LOW (ref >90)
Glucose: 96 mg/dl (ref 70–140)
Potassium: 4.3 mEq/L (ref 3.5–5.1)
Sodium: 144 mEq/L (ref 136–145)

## 2015-10-24 LAB — CBC WITH DIFFERENTIAL/PLATELET
BASO%: 0.7 % (ref 0.0–2.0)
Basophils Absolute: 0 10*3/uL (ref 0.0–0.1)
EOS%: 3.7 % (ref 0.0–7.0)
Eosinophils Absolute: 0.1 10*3/uL (ref 0.0–0.5)
HCT: 33.1 % — ABNORMAL LOW (ref 34.8–46.6)
HGB: 10.7 g/dL — ABNORMAL LOW (ref 11.6–15.9)
LYMPH%: 20.6 % (ref 14.0–49.7)
MCH: 26.1 pg (ref 25.1–34.0)
MCHC: 32.3 g/dL (ref 31.5–36.0)
MCV: 80.8 fL (ref 79.5–101.0)
MONO#: 0.2 10*3/uL (ref 0.1–0.9)
MONO%: 9.5 % (ref 0.0–14.0)
NEUT#: 1.5 10*3/uL (ref 1.5–6.5)
NEUT%: 65.5 % (ref 38.4–76.8)
Platelets: 241 10*3/uL (ref 145–400)
RBC: 4.1 10*6/uL (ref 3.70–5.45)
RDW: 17.4 % — ABNORMAL HIGH (ref 11.2–14.5)
WBC: 2.3 10*3/uL — ABNORMAL LOW (ref 3.9–10.3)
lymph#: 0.5 10*3/uL — ABNORMAL LOW (ref 0.9–3.3)

## 2015-10-24 LAB — TECHNOLOGIST REVIEW

## 2015-10-24 MED ORDER — INFLUENZA VAC SPLIT QUAD 0.5 ML IM SUSY
0.5000 mL | PREFILLED_SYRINGE | Freq: Once | INTRAMUSCULAR | Status: AC
  Administered 2015-10-24: 0.5 mL via INTRAMUSCULAR
  Filled 2015-10-24: qty 0.5

## 2015-10-24 NOTE — Telephone Encounter (Signed)
Gave and printed appt sched and avs for pt for JUNE °

## 2015-10-24 NOTE — Patient Instructions (Signed)

## 2015-10-24 NOTE — Progress Notes (Addendum)
  [facility] OFFICE PROGRESS NOTE   Diagnosis:  Waldenstrm's macroglobulinemia  INTERVAL HISTORY:   Ms. Newlon returns as scheduled. She reports a persistent cough. She is hopeful the cough will improve following upcoming sinus surgery. No fevers or sweats. She has a good appetite.  Objective:  Vital signs in last 24 hours:  Blood pressure 150/54, pulse 79, temperature 98.3 F (36.8 C), temperature source Oral, resp. rate 17, height 5\' 6"  (1.676 m), weight 260 lb 12.8 oz (118.298 kg), SpO2 98 %.    HEENT: No thrush or ulcers. Lymphatics: No palpable right cervical, supraclavicular or axillary lymph nodes. Question tiny palpable lymph node anterior to the left submandibular gland. Resp: Bilateral expiratory rhonchi. Cardio: Regular rate and rhythm. GI: No organomegaly. Nontender. Vascular: No leg edema.   Lab Results:  Lab Results  Component Value Date   WBC 2.3* 10/24/2015   HGB 10.7* 10/24/2015   HCT 33.1* 10/24/2015   MCV 80.8 10/24/2015   PLT 241 10/24/2015   NEUTROABS 1.5 10/24/2015    Imaging:  No results found.  Medications: I have reviewed the patient's current medications.  Assessment/Plan: 1. 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 Treated with multiple systemic therapies includingpentostatin/Cytoxan/rituximab in 2010.   Cycle 1 bendamustine/Rituxan 05/31/2014.  Cycle 2 bendamustine/Rituxan 07/02/2014  Cycle 3 bendamustine/rituximab 07/30/2014  Cycle 4 bendamustine/rituximab 09/10/2014  Cycle 5 bendamustine/rituximab 10/22/2014  Cycle 6 bendamustine/rituximab 12/10/2014 2. Left knee arthritis. Followed by Dr. Wynelle Link. 3. Sleep apnea. 4. Anemia. Now microcytic with peripheral blood smear findings consistent with iron deficiency 07/09/2015, serum iron  studies consistent with "anemia of chronic disease ", stool Hemoccult cards negative 07/11/2015 5. Question left parotitis 12/12/2013 presenting with left facial and parotid edema. 6. Admission with pneumonia February 2016 7. History of Neutropenia secondary to chemotherapy, now with moderate neutropenia 8. Left lung pneumonia 04/02/2015, sputum culture positive for Moraxella 06/19/2015 9. Low IgG and IgA 10. CT chest 05/13/2015 with bilateral lower lung consolidation 11. Sinus mucosal thickening/opacification on CT 08/16/2015   Disposition: Ms. Billingslea remains stable from a hematologic standpoint. We will follow-up on the [name] level from today.  She has not had the influenza vaccine. We recommended that she receive the influenza vaccine. She is agreeable. We will administer while she is in the office today.  She will return for a follow-up visit and labs in 3 months.  Patient seen with Dr. Benay Spice.    Ned Card ANP/GNP-BC   10/24/2015  11:05 AM  This was a shared visit with Ned Card. She appears stable from a hematologic standpoint. She will return for an office and lab visit in 3 months.  Julieanne Manson, M.D.

## 2015-10-25 LAB — IGM: IgM, Qn, Serum: 1670 mg/dL — ABNORMAL HIGH (ref 26–217)

## 2015-10-30 ENCOUNTER — Telehealth: Payer: Self-pay | Admitting: Pulmonary Disease

## 2015-10-30 NOTE — Telephone Encounter (Signed)
Per Dr. Elsworth Soho:  Pulse oximetry tests shows minimal desaturations; does not need O2.  Patient notified. Nothing further needed.

## 2015-10-31 ENCOUNTER — Telehealth: Payer: Self-pay | Admitting: Pulmonary Disease

## 2015-10-31 NOTE — Telephone Encounter (Signed)
Last ov on  Patient Instructions       OK to proceed with sinus surgery  Take mucinex daytime 600 twice daily & delsym at night  CPAP settings are good We can get you new humidifier if problem persists after surgery   Check oxygen level on CPAP/RA Call if worse   Called and spoke with Ivin Booty with [facility]. She states that the pt is going to be scheduled for a hour to an hour and a half long sinus case surgery and she wanted to get surgical clearance from RA. She states that the pt has spoken with RA about this previously and would like his recs. I explained to her in his last ov he states she may proceed with surgery. She request this note be faxed to [phone]. She voiced understanding and had no further questions. OV note faxed. Nothing further needed.

## 2015-11-04 ENCOUNTER — Telehealth: Payer: Self-pay | Admitting: Pulmonary Disease

## 2015-11-04 NOTE — Telephone Encounter (Signed)
Spoke with pt and she states that she has been taking Mucinex BID as RA had directed at [place]. Pt reports that taking it twice a day gives her diarrhea. Pt stopped it for several days and then resumed and side effect returned. Pt states that she is still very congested and coughing. She has been taking Delsym, which does help her cough. Pt would like to know what other decongestant would be recommended since the Mucinex is upsetting her stomach. Please advise.   Routed to MR for response as RA is on [facility]Link.    LOV 10/22/15 Patient Instructions     OK to proceed with sinus surgery  Take mucinex daytime 600 twice daily & delsym at night

## 2015-11-04 NOTE — Telephone Encounter (Signed)
Non urgent question. Hard to answer. Route back to Dr Elsworth Soho please

## 2015-11-04 NOTE — Telephone Encounter (Signed)
Spoke with the pt and notified of recs per Dr Alva  She verbalized understanding  Nothing further needed 

## 2015-11-04 NOTE — Telephone Encounter (Signed)
Decrease mucinex to once daily If that is still a problem, can take robitussin daytime & delsym at night

## 2015-11-04 NOTE — Telephone Encounter (Signed)
Dr. Alva, please advise. 

## 2015-11-05 ENCOUNTER — Encounter: Payer: Self-pay | Admitting: Pulmonary Disease

## 2015-11-07 DIAGNOSIS — C880 Waldenstrom macroglobulinemia: Secondary | ICD-10-CM | POA: Diagnosis not present

## 2015-11-07 DIAGNOSIS — Z6841 Body Mass Index (BMI) 40.0 and over, adult: Secondary | ICD-10-CM | POA: Diagnosis not present

## 2015-11-07 DIAGNOSIS — I10 Essential (primary) hypertension: Secondary | ICD-10-CM | POA: Diagnosis not present

## 2015-11-07 DIAGNOSIS — J328 Other chronic sinusitis: Secondary | ICD-10-CM | POA: Diagnosis not present

## 2015-11-22 ENCOUNTER — Encounter: Payer: Self-pay | Admitting: Pulmonary Disease

## 2015-11-26 ENCOUNTER — Ambulatory Visit: Payer: Self-pay | Admitting: Otolaryngology

## 2015-11-26 NOTE — H&P (Signed)
Progress Notes - in this encounter [name], MD - 10/07/2015 1:50 PM EST She completed the month-long course of clindamycin without any significant relief in her symptoms especially her chronic cough and mucoid discharge. On exam, the nasal cavities were free of polyps but there is mucosal edema and some mucus drainage. The followup CT scan today reveals no significant change in the chronic pansinusitis and air-fluid levels. We discussed the next option for her is surgical intervention which would involve endoscopic ethmoid, maxillary and frontal sinus surgery. Risks and benefits were discussed in detail. All questions were answered. She will think about this and will contact us when she is ready.  Electronically signed by: [name], MD 10/07/15 1607  Plan of Treatment - as of this encounter Not on file  Imaging Results - in this encounter   ENTCT FULL AXIAL OR CORONAL (10/07/2015 3:15 PM) ENTCT FULL AXIAL OR CORONAL (10/07/2015 3:15 PM)  Specimen Performing Laboratory   [facility] [place] [address]    ENTCT FULL AXIAL OR CORONAL (10/07/2015 3:15 PM)  Narrative  CLINICAL DATA:82 year old female with cough and recent sinus  infection treated with antibiotics. Subsequent encounter.    EXAM:  CT PARANASAL SINUS WITHOUT CONTRAST    TECHNIQUE:  Multidetector CT images of the paranasal sinuses were obtained using  the standard protocol without intravenous contrast.    [place] limited sinus CT 08/16/2015. Brain  MRI 04/29/2013.    FINDINGS:  Mild-to-moderate degree of pansinus inflammation was evident in  2014.    Since January, ethmoid and maxillary sinus pneumatization has not  significantly improved. The small right maxillary sinus fluid level  has resolved but there is continued bubbly opacity in the left  maxillary sinus. Subtotal ethmoid opacification, relatively sparing  the posterior ethmoids,  has not significantly changed.    Frontal sinus bubbly opacity and mucosal thickening has mildly  regressed, with residual more so on the right.    Mild sphenoid sinus mucosal thickening is not significantly changed.    Bilateral tympanic cavities and mastoids remain clear.    Both OMCs remain opacified (coronal image 27). Fairly symmetric  nasal cavity mucosal thickening. Rightward anterior and leftward  posterior nasal septal deviation. Maxillary dentition is largely  absent.    IMPRESSION:  Minimal improvement in pansinus inflammatory changes since January.  Continued bubbly opacity in the left maxillary and right frontal  sinuses.      Electronically Signed  ByCarollee Herter M.D.  On: 10/07/2015 15:41      ENTCT FULL AXIAL OR CORONAL (10/07/2015 3:15 PM)  Procedure Note  Interface, Rad Results In - 10/07/2015 3:44 PM EST  CLINICAL DATA: 82 year old female with cough and recent sinus infection treated with antibiotics. Subsequent encounter.  EXAM: CT PARANASAL SINUS WITHOUT CONTRAST  TECHNIQUE: Multidetector CT images of the paranasal sinuses were obtained using the standard protocol without intravenous contrast.  COMPARISON: [place] limited sinus CT 08/16/2015. Brain MRI 04/29/2013.  FINDINGS: Mild-to-moderate degree of pansinus inflammation was evident in 2014.  Since January, ethmoid and maxillary sinus pneumatization has not significantly improved. The small right maxillary sinus fluid level has resolved but there is continued bubbly opacity in the left maxillary sinus. Subtotal ethmoid opacification, relatively sparing the posterior ethmoids, has not significantly changed.  Frontal sinus bubbly opacity and mucosal thickening has mildly regressed, with residual more so on the right.  Mild sphenoid sinus mucosal thickening is not significantly changed.  Bilateral tympanic cavities and mastoids  remain clear.  Both OMCs  remain opacified (coronal image 27). Fairly symmetric nasal cavity mucosal thickening. Rightward anterior and leftward posterior nasal septal deviation. Maxillary dentition is largely absent.  IMPRESSION: Minimal improvement in pansinus inflammatory changes since January. Continued bubbly opacity in the left maxillary and right frontal sinuses.   Electronically Signed By: Genevie Ann M.D. On: 10/07/2015 15:41    Visit Diagnoses - in this encounter Diagnosis  Chronic pansinusitis - Primary  Other chronic sinusitis   Historical Medications - added in this encounter This list may reflect changes made after this encounter.  Medication Sig. Disp. Refills Start Date End Date  potassium chloride (KLOR-CON) 10 MEQ CR tablet  Take by mouth.      multivitamin capsule  Take 1 capsule by mouth.      loperamide (IMODIUM) 2 mg capsule  Take by mouth.   05/01/2013   ibuprofen (ADVIL,MOTRIN) 200 MG tablet  Take by mouth.      guaiFENesin (ROBITUSSIN) 100 mg/5 mL syrup  Take by mouth.   09/22/2014   fluticasone (FLONASE) 50 mcg/actuation nasal spray  1 spray by Nasal route.      ferrous sulfate (FERATAB) 325 (65 FE) MG tablet  Take by mouth.      albuterol 2.5 mg /3 mL (0.083 %) nebulizer solution  Take 3 mLs (2.5 mg total) by nebulization every 4 (four) hours as needed for wheezing or shortness of breath.   09/22/2014   albuterol 90 mcg/actuation inhaler  Inhale 1-2 puffs into the lungs.   07/09/2015   cholestyramine (QUESTRAN) 4 gram powder  Take by mouth.      furosemide (LASIX) 20 MG tablet     08/19/2015   fluocinonide (LIDEX) 0.05 % cream     09/16/2015   clopidogrel (PLAVIX) 75 mg tablet *ANTIPLATELET*     08/19/2015   alendronate (FOSAMAX) 70 MG tablet

## 2015-11-29 ENCOUNTER — Encounter (HOSPITAL_COMMUNITY): Payer: Self-pay

## 2015-11-29 ENCOUNTER — Ambulatory Visit (HOSPITAL_COMMUNITY)
Admission: RE | Admit: 2015-11-29 | Discharge: 2015-11-29 | Disposition: A | Discharge status: Home / Self Care | Payer: Medicare Other | Source: Ambulatory Visit | Attending: Anesthesiology | Admitting: Anesthesiology

## 2015-11-29 ENCOUNTER — Encounter (HOSPITAL_COMMUNITY)
Admission: RE | Admit: 2015-11-29 | Discharge: 2015-11-29 | Disposition: A | Discharge status: Home / Self Care | Payer: Medicare Other | Source: Ambulatory Visit | Attending: Otolaryngology | Admitting: Otolaryngology

## 2015-11-29 DIAGNOSIS — J45909 Unspecified asthma, uncomplicated: Secondary | ICD-10-CM | POA: Insufficient documentation

## 2015-11-29 DIAGNOSIS — I10 Essential (primary) hypertension: Secondary | ICD-10-CM | POA: Insufficient documentation

## 2015-11-29 DIAGNOSIS — R509 Fever, unspecified: Secondary | ICD-10-CM | POA: Insufficient documentation

## 2015-11-29 DIAGNOSIS — Z01812 Encounter for preprocedural laboratory examination: Secondary | ICD-10-CM | POA: Diagnosis not present

## 2015-11-29 DIAGNOSIS — H903 Sensorineural hearing loss, bilateral: Secondary | ICD-10-CM | POA: Diagnosis not present

## 2015-11-29 DIAGNOSIS — Z8673 Personal history of transient ischemic attack (TIA), and cerebral infarction without residual deficits: Secondary | ICD-10-CM | POA: Insufficient documentation

## 2015-11-29 DIAGNOSIS — G4733 Obstructive sleep apnea (adult) (pediatric): Secondary | ICD-10-CM | POA: Diagnosis not present

## 2015-11-29 DIAGNOSIS — C880 Waldenstrom macroglobulinemia: Secondary | ICD-10-CM | POA: Diagnosis not present

## 2015-11-29 DIAGNOSIS — Z7983 Long term (current) use of bisphosphonates: Secondary | ICD-10-CM | POA: Insufficient documentation

## 2015-11-29 DIAGNOSIS — E7800 Pure hypercholesterolemia, unspecified: Secondary | ICD-10-CM | POA: Insufficient documentation

## 2015-11-29 DIAGNOSIS — Z79899 Other long term (current) drug therapy: Secondary | ICD-10-CM | POA: Insufficient documentation

## 2015-11-29 DIAGNOSIS — R918 Other nonspecific abnormal finding of lung field: Secondary | ICD-10-CM | POA: Insufficient documentation

## 2015-11-29 DIAGNOSIS — Z7902 Long term (current) use of antithrombotics/antiplatelets: Secondary | ICD-10-CM | POA: Insufficient documentation

## 2015-11-29 DIAGNOSIS — Z01818 Encounter for other preprocedural examination: Secondary | ICD-10-CM | POA: Diagnosis not present

## 2015-11-29 DIAGNOSIS — C8590 Non-Hodgkin lymphoma, unspecified, unspecified site: Secondary | ICD-10-CM | POA: Diagnosis not present

## 2015-11-29 DIAGNOSIS — J329 Chronic sinusitis, unspecified: Secondary | ICD-10-CM | POA: Insufficient documentation

## 2015-11-29 DIAGNOSIS — Z87891 Personal history of nicotine dependence: Secondary | ICD-10-CM | POA: Insufficient documentation

## 2015-11-29 DIAGNOSIS — I4510 Unspecified right bundle-branch block: Secondary | ICD-10-CM | POA: Diagnosis not present

## 2015-11-29 LAB — CBC
HCT: 33.5 % — ABNORMAL LOW (ref 36.0–46.0)
Hemoglobin: 10.7 g/dL — ABNORMAL LOW (ref 12.0–15.0)
MCH: 27.4 pg (ref 26.0–34.0)
MCHC: 31.9 g/dL (ref 30.0–36.0)
MCV: 85.7 fL (ref 78.0–100.0)
Platelets: 279 10*3/uL (ref 150–400)
RBC: 3.91 MIL/uL (ref 3.87–5.11)
RDW: 16.3 % — ABNORMAL HIGH (ref 11.5–15.5)
WBC: 6.9 10*3/uL (ref 4.0–10.5)

## 2015-11-29 LAB — BASIC METABOLIC PANEL
Anion gap: 11 (ref 5–15)
BUN: 12 mg/dL (ref 6–20)
CO2: 26 mmol/L (ref 22–32)
Calcium: 9.3 mg/dL (ref 8.9–10.3)
Chloride: 106 mmol/L (ref 101–111)
Creatinine, Ser: 0.89 mg/dL (ref 0.44–1.00)
GFR calc Af Amer: >60 mL/min (ref >60)
GFR calc non Af Amer: 59 mL/min — ABNORMAL LOW (ref >60)
Glucose, Bld: 93 mg/dL (ref 65–99)
Potassium: 3.8 mmol/L (ref 3.5–5.1)
Sodium: 143 mmol/L (ref 135–145)

## 2015-11-29 NOTE — Pre-Procedure Instructions (Signed)
    Natasha Ellis  11/29/2015      [facility] - [place], [place] - [address] [place] [address] Phone: [phone] Fax: [phone]  [place], [place] [address] [place] [address] [place] [address] Phone: [phone] Fax: [phone]    Your procedure is scheduled on Dec 04, 2015.  Report to [facility] Admitting at 9:30 A.M.  Call this number if you have problems the morning of surgery:  [phone]   Remember:  Do not eat food or drink liquids after midnight.  Take these medicines the morning of surgery with A SIP OF WATER : IF NEEDED  acetaminophen (TYLENOL), albuterol inhaler- BRING WITH YOU, albuterol -nebulizer,  guaifenesin (ROBITUSSIN)   STOP PLAVIX 5 DAYS PRIOR TO SURGERY 4/27 LAST DOSE  STOP ASPIRIN, HERBAL MEDICATIONS, NSAID'S(ADVIL, IBUPROFEN) AS OF TODAY   Do not wear jewelry, make-up or nail polish.  Do not wear lotions, powders, or perfumes.  You may wear deodorant.  Do not shave 48 hours prior to surgery.  Men may shave face and neck.  Do not bring valuables to the hospital.  [facility] is not responsible for any belongings or valuables.  Contacts, dentures or bridgework may not be worn into surgery.  Leave your suitcase in the car.  After surgery it may be brought to your room.  For patients admitted to the hospital, discharge time will be determined by your treatment team.  Patients discharged the day of surgery will not be allowed to drive home.   Name and phone number of your driver:    Special instructions:  "PREPARING FOR SURGERY"  Please read over the following fact sheets that you were given. Pain Booklet, Coughing and Deep Breathing and Surgical Site Infection Prevention

## 2015-12-02 NOTE — Progress Notes (Signed)
Anesthesia Chart Review: Patient is a 82 year old female scheduled for bilateral ethmoid, maxillary and frontal sinus surgery on 12/04/2015 by Dr. [name].  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 BMI is consistent with morbid obesity.   - PCP is Dr. Felipa Eth, last visit 11/07/15 for surgical clearance. He gave permission to hold Plavix 5 days before and after surgery.  - HEM-ONC is Dr. Benay Spice, last visit with Ned Card, NP on 10/24/15. - Pulmonologist is Dr. Kara Mead. Notation on 10/22/15 states patient "OK to proceed with sinus surgery."  Meds include albuterol, Fosamax, Plavix, 65 Fe, Flonase, Lasix, KCl.  PAT Vitals: BP 127/55, HR 55, RR 18, T 36.9C, O2 sat 94%      11/29/15 EKG: NSR, right BBB. No significant change since last tracing 09/19/14. Right BBB has been present since at least 04/28/13.  04/29/13 Echo: Study Conclusions - Left ventricle: The cavity size was normal. There was mild focal basal and mild concentric hypertrophy of the septum. Systolic function was normal. The estimated ejection fraction was in the range of 55% to 60%. Wall motion was normal; there were no regional wall motion abnormalities. Doppler parameters are consistent with abnormal left ventricular relaxation (grade 1 diastolic dysfunction). - Aortic valve: Mild regurgitation. - Left atrium: The atrium was mildly dilated. - Right ventricle: Systolic function was normal. - Pulmonary arteries: Systolic pressure was within the normal range. Impressions: - Challenging image quality. No source of CVA or TIA noted.  01/13/13 Nuclear stress test: The observed defect is consistent with breast attenuation. Normal myocardial perfusion scan demonstrating an attenuation artifact in the anterior region of the myocardium. No ischemia or infarct/sca is seen in  the remaining myocardium. Post-stress EF 77%.   04/29/13 Carotid U/S: Summary: - Left vertebral artery appears patent with antegrade flow. Right vertebral artery appears patent with atypical flow, possibly early "bunny sign." suggestive of distal occlusion/high grade stenosis. - Findings consistent with 1-39 percent stenosis involving the right internal carotid artery and the left internal carotid artery.  11/29/15 CXR: IMPRESSION: Low lung volumes with mild bibasilar atelectasis and/or scarring. Chest is stable from multiple prior exams.  12/19/12 PFTs: FVC 2.54 88%, FEV1 1.81 84%, DLCOunc 15.29 56%.    Preoperative labs noted. H/H 10.7/33.5, stable since last month. Cr 0.89. Glucose 93.   If no acute changes then I anticipate that she can proceed as planned.  [facility] Short Stay Center/Anesthesiology Phone [phone] 12/02/2015 1:38 PM

## 2015-12-04 ENCOUNTER — Ambulatory Visit (HOSPITAL_COMMUNITY): Payer: Medicare Other | Admitting: Certified Registered Nurse Anesthetist

## 2015-12-04 ENCOUNTER — Ambulatory Visit (HOSPITAL_COMMUNITY)
Admission: RE | Admit: 2015-12-04 | Discharge: 2015-12-04 | Disposition: A | Discharge status: Home / Self Care | Payer: Medicare Other | Source: Ambulatory Visit | Attending: Otolaryngology | Admitting: Otolaryngology

## 2015-12-04 ENCOUNTER — Encounter (HOSPITAL_COMMUNITY)
Admission: RE | Disposition: A | Discharge status: Home / Self Care | Payer: Self-pay | Source: Ambulatory Visit | Attending: Otolaryngology

## 2015-12-04 ENCOUNTER — Encounter (HOSPITAL_COMMUNITY): Payer: Self-pay | Admitting: Certified Registered Nurse Anesthetist

## 2015-12-04 ENCOUNTER — Ambulatory Visit (HOSPITAL_COMMUNITY): Payer: Medicare Other | Admitting: Vascular Surgery

## 2015-12-04 DIAGNOSIS — G4730 Sleep apnea, unspecified: Secondary | ICD-10-CM | POA: Insufficient documentation

## 2015-12-04 DIAGNOSIS — J322 Chronic ethmoidal sinusitis: Secondary | ICD-10-CM | POA: Diagnosis not present

## 2015-12-04 DIAGNOSIS — M1990 Unspecified osteoarthritis, unspecified site: Secondary | ICD-10-CM | POA: Diagnosis not present

## 2015-12-04 DIAGNOSIS — R05 Cough: Secondary | ICD-10-CM | POA: Insufficient documentation

## 2015-12-04 DIAGNOSIS — I10 Essential (primary) hypertension: Secondary | ICD-10-CM | POA: Diagnosis not present

## 2015-12-04 DIAGNOSIS — Z87891 Personal history of nicotine dependence: Secondary | ICD-10-CM | POA: Diagnosis not present

## 2015-12-04 DIAGNOSIS — K589 Irritable bowel syndrome without diarrhea: Secondary | ICD-10-CM | POA: Diagnosis not present

## 2015-12-04 DIAGNOSIS — J329 Chronic sinusitis, unspecified: Secondary | ICD-10-CM | POA: Diagnosis present

## 2015-12-04 DIAGNOSIS — J324 Chronic pansinusitis: Secondary | ICD-10-CM | POA: Insufficient documentation

## 2015-12-04 DIAGNOSIS — J321 Chronic frontal sinusitis: Secondary | ICD-10-CM | POA: Diagnosis not present

## 2015-12-04 DIAGNOSIS — J320 Chronic maxillary sinusitis: Secondary | ICD-10-CM | POA: Diagnosis not present

## 2015-12-04 HISTORY — PX: NASAL SINUS SURGERY: SHX719

## 2015-12-04 SURGERY — SINUS SURGERY, ENDOSCOPIC
Anesthesia: General

## 2015-12-04 MED ORDER — HYDROCODONE-ACETAMINOPHEN 7.5-325 MG PO TABS
1.0000 | ORAL_TABLET | Freq: Once | ORAL | Status: AC
  Administered 2015-12-04: 1 via ORAL
  Filled 2015-12-04: qty 1

## 2015-12-04 MED ORDER — ROCURONIUM BROMIDE 100 MG/10ML IV SOLN
INTRAVENOUS | Status: DC | PRN
  Administered 2015-12-04: 50 mg via INTRAVENOUS

## 2015-12-04 MED ORDER — ACETAMINOPHEN 10 MG/ML IV SOLN: 1000.0000 mg | Freq: Once | INTRAVENOUS | Status: DC

## 2015-12-04 MED ORDER — LIDOCAINE-EPINEPHRINE 1 %-1:100000 IJ SOLN
INTRAMUSCULAR | Status: DC | PRN
  Administered 2015-12-04: 6.5 mL

## 2015-12-04 MED ORDER — CLINDAMYCIN PHOSPHATE 900 MG/50ML IV SOLN: 900.0000 mg | INTRAVENOUS | Status: DC

## 2015-12-04 MED ORDER — ONDANSETRON HCL 4 MG/2ML IJ SOLN: 4.0000 mg | Freq: Once | INTRAMUSCULAR | Status: DC | PRN

## 2015-12-04 MED ORDER — FENTANYL CITRATE (PF) 100 MCG/2ML IJ SOLN
INTRAMUSCULAR | Status: AC
  Filled 2015-12-04: qty 2

## 2015-12-04 MED ORDER — SODIUM CHLORIDE 0.9 % IR SOLN
Status: DC | PRN
  Administered 2015-12-04: 1000 mL

## 2015-12-04 MED ORDER — ARTIFICIAL TEARS OP OINT
TOPICAL_OINTMENT | OPHTHALMIC | Status: DC | PRN
  Administered 2015-12-04: 1 via OPHTHALMIC

## 2015-12-04 MED ORDER — LIDOCAINE 2% (20 MG/ML) 5 ML SYRINGE
INTRAMUSCULAR | Status: AC
  Filled 2015-12-04: qty 10

## 2015-12-04 MED ORDER — SUGAMMADEX SODIUM 200 MG/2ML IV SOLN
INTRAVENOUS | Status: DC | PRN
  Administered 2015-12-04: 200 mg via INTRAVENOUS

## 2015-12-04 MED ORDER — ROCURONIUM BROMIDE 50 MG/5ML IV SOLN
INTRAVENOUS | Status: AC
  Filled 2015-12-04: qty 1

## 2015-12-04 MED ORDER — PROMETHAZINE HCL 25 MG RE SUPP: 25.0000 mg | Freq: Four times a day (QID) | RECTAL | Status: DC | PRN

## 2015-12-04 MED ORDER — LIDOCAINE HCL (CARDIAC) 20 MG/ML IV SOLN
INTRAVENOUS | Status: DC | PRN
  Administered 2015-12-04: 100 mg via INTRAVENOUS

## 2015-12-04 MED ORDER — 0.9 % SODIUM CHLORIDE (POUR BTL) OPTIME
TOPICAL | Status: DC | PRN
  Administered 2015-12-04: 1000 mL

## 2015-12-04 MED ORDER — OXYMETAZOLINE HCL 0.05 % NA SOLN
NASAL | Status: AC
  Filled 2015-12-04: qty 15

## 2015-12-04 MED ORDER — OXYMETAZOLINE HCL 0.05 % NA SOLN
2.0000 | NASAL | Status: DC
  Administered 2015-12-04: 2 via NASAL
  Filled 2015-12-04: qty 15

## 2015-12-04 MED ORDER — ONDANSETRON HCL 4 MG/2ML IJ SOLN
INTRAMUSCULAR | Status: DC | PRN
  Administered 2015-12-04: 4 mg via INTRAVENOUS

## 2015-12-04 MED ORDER — FENTANYL CITRATE (PF) 100 MCG/2ML IJ SOLN
25.0000 ug | INTRAMUSCULAR | Status: DC | PRN
  Administered 2015-12-04 (×2): 50 ug via INTRAVENOUS

## 2015-12-04 MED ORDER — ACETAMINOPHEN 10 MG/ML IV SOLN
INTRAVENOUS | Status: AC
  Administered 2015-12-04: 1000 mg
  Filled 2015-12-04: qty 100

## 2015-12-04 MED ORDER — FENTANYL CITRATE (PF) 100 MCG/2ML IJ SOLN
INTRAMUSCULAR | Status: DC | PRN
  Administered 2015-12-04 (×3): 50 ug via INTRAVENOUS
  Administered 2015-12-04: 100 ug via INTRAVENOUS

## 2015-12-04 MED ORDER — EPHEDRINE SULFATE 50 MG/ML IJ SOLN
INTRAMUSCULAR | Status: DC | PRN
  Administered 2015-12-04: 10 mg via INTRAVENOUS
  Administered 2015-12-04: 15 mg via INTRAVENOUS
  Administered 2015-12-04 (×2): 10 mg via INTRAVENOUS

## 2015-12-04 MED ORDER — OXYMETAZOLINE HCL 0.05 % NA SOLN
NASAL | Status: DC | PRN
  Administered 2015-12-04: 1 via TOPICAL

## 2015-12-04 MED ORDER — CLINDAMYCIN PHOSPHATE 900 MG/50ML IV SOLN
INTRAVENOUS | Status: AC
  Administered 2015-12-04: 900 mg via INTRAVENOUS
  Filled 2015-12-04: qty 50

## 2015-12-04 MED ORDER — FENTANYL CITRATE (PF) 250 MCG/5ML IJ SOLN
INTRAMUSCULAR | Status: AC
  Filled 2015-12-04: qty 5

## 2015-12-04 MED ORDER — CEPHALEXIN 500 MG PO CAPS: 500.0000 mg | ORAL_CAPSULE | Freq: Three times a day (TID) | ORAL | Status: DC

## 2015-12-04 MED ORDER — ALBUTEROL SULFATE HFA 108 (90 BASE) MCG/ACT IN AERS
INHALATION_SPRAY | RESPIRATORY_TRACT | Status: DC | PRN
  Administered 2015-12-04: 6 via RESPIRATORY_TRACT

## 2015-12-04 MED ORDER — HYDROCODONE-ACETAMINOPHEN 7.5-325 MG PO TABS: 1.0000 | ORAL_TABLET | Freq: Four times a day (QID) | ORAL | Status: DC | PRN

## 2015-12-04 MED ORDER — LACTATED RINGERS IV SOLN
INTRAVENOUS | Status: DC
  Administered 2015-12-04 (×2): via INTRAVENOUS

## 2015-12-04 MED ORDER — PROPOFOL 10 MG/ML IV BOLUS
INTRAVENOUS | Status: DC | PRN
  Administered 2015-12-04: 100 mg via INTRAVENOUS

## 2015-12-04 MED ORDER — LIDOCAINE-EPINEPHRINE 1 %-1:100000 IJ SOLN
INTRAMUSCULAR | Status: AC
  Filled 2015-12-04: qty 1

## 2015-12-04 MED ORDER — BACITRACIN ZINC 500 UNIT/GM EX OINT
TOPICAL_OINTMENT | CUTANEOUS | Status: AC
  Filled 2015-12-04: qty 28.35

## 2015-12-04 SURGICAL SUPPLY — 29 items
BLADE TRICUT ROTATE M4 4 5PK (BLADE) ×2 IMPLANT
BLADE TRICUT ROTATE M4 4MM 5PK (BLADE) ×1
CANISTER SUCTION 2500CC (MISCELLANEOUS) ×3 IMPLANT
CONT SPEC 4OZ CLIKSEAL STRL BL (MISCELLANEOUS) ×3 IMPLANT
DRESSING NASAL KENNEDY 3.5X.9 (MISCELLANEOUS) IMPLANT
DRSG NASAL KENNEDY 3.5X.9 (MISCELLANEOUS)
DRSG NASOPORE 8CM (GAUZE/BANDAGES/DRESSINGS) ×3 IMPLANT
ELECT REM PT RETURN 9FT ADLT (ELECTROSURGICAL) ×3
ELECTRODE REM PT RTRN 9FT ADLT (ELECTROSURGICAL) ×1 IMPLANT
FILTER ARTHROSCOPY CONVERTOR (FILTER) ×6 IMPLANT
GLOVE BIOGEL PI IND STRL 7.0 (GLOVE) ×1 IMPLANT
GLOVE BIOGEL PI INDICATOR 7.0 (GLOVE) ×2
GLOVE ECLIPSE 7.5 STRL STRAW (GLOVE) ×3 IMPLANT
GLOVE SURG SS PI 7.0 STRL IVOR (GLOVE) ×3 IMPLANT
GOWN STRL REUS W/ TWL LRG LVL3 (GOWN DISPOSABLE) ×2 IMPLANT
GOWN STRL REUS W/TWL LRG LVL3 (GOWN DISPOSABLE) ×4
KIT BASIN OR (CUSTOM PROCEDURE TRAY) ×3 IMPLANT
KIT ROOM TURNOVER OR (KITS) ×3 IMPLANT
NEEDLE 27GAX1X1/2 (NEEDLE) ×3 IMPLANT
NS IRRIG 1000ML POUR BTL (IV SOLUTION) ×3 IMPLANT
PAD ARMBOARD 7.5X6 YLW CONV (MISCELLANEOUS) ×6 IMPLANT
PATTIES SURGICAL .5 X3 (DISPOSABLE) ×3 IMPLANT
SHEATH ENDOSCRUB 0 DEG (SHEATH) IMPLANT
SHEATH ENDOSCRUB 30 DEG (SHEATH) IMPLANT
TOWEL OR 17X24 6PK STRL BLUE (TOWEL DISPOSABLE) ×3 IMPLANT
TRAY ENT MC OR (CUSTOM PROCEDURE TRAY) ×3 IMPLANT
TUBE CONNECTING 12'X1/4 (SUCTIONS) ×1
TUBE CONNECTING 12X1/4 (SUCTIONS) ×2 IMPLANT
TUBING EXTENTION W/L.L. (IV SETS) ×3 IMPLANT

## 2015-12-04 NOTE — Discharge Instructions (Signed)
Start using nasal saline (saltwater) spray several times daily to keep the nose moist.

## 2015-12-04 NOTE — Progress Notes (Signed)
Significantly less anxious/fidgety, expectorates brb mucus, no dripping from nares. No pain in sinus area, headche remains but  Much less intense,bearable

## 2015-12-04 NOTE — H&P (View-Only) (Signed)
Progress Notes - in this encounter Natasha Salts, MD - 10/07/2015 1:50 PM EST She completed the month-long course of clindamycin without any significant relief in her symptoms especially her chronic cough and mucoid discharge. On exam, the nasal cavities were free of polyps but there is mucosal edema and some mucus drainage. The followup CT scan today reveals no significant change in the chronic pansinusitis and air-fluid levels. We discussed the next option for her is surgical intervention which would involve endoscopic ethmoid, maxillary and frontal sinus surgery. Risks and benefits were discussed in detail. All questions were answered. She will think about this and will contact us when she is ready.  Electronically signed by: Natasha Salts, MD 10/07/15 1607  Plan of Treatment - as of this encounter Not on file  Imaging Results - in this encounter   ENTCT FULL AXIAL OR CORONAL (10/07/2015 3:15 PM) ENTCT FULL AXIAL OR CORONAL (10/07/2015 3:15 PM)  Specimen Performing Laboratory   [facility] [place] [address]    ENTCT FULL AXIAL OR CORONAL (10/07/2015 3:15 PM)  Narrative  CLINICAL DATA:82 year old female with cough and recent sinus  infection treated with antibiotics. Subsequent encounter.    EXAM:  CT PARANASAL SINUS WITHOUT CONTRAST    TECHNIQUE:  Multidetector CT images of the paranasal sinuses were obtained using  the standard protocol without intravenous contrast.    [place] limited sinus CT 08/16/2015. Brain  MRI 04/29/2013.    FINDINGS:  Mild-to-moderate degree of pansinus inflammation was evident in  2014.    Since January, ethmoid and maxillary sinus pneumatization has not  significantly improved. The small right maxillary sinus fluid level  has resolved but there is continued bubbly opacity in the left  maxillary sinus. Subtotal ethmoid opacification, relatively sparing  the posterior ethmoids,  has not significantly changed.    Frontal sinus bubbly opacity and mucosal thickening has mildly  regressed, with residual more so on the right.    Mild sphenoid sinus mucosal thickening is not significantly changed.    Bilateral tympanic cavities and mastoids remain clear.    Both OMCs remain opacified (coronal image 27). Fairly symmetric  nasal cavity mucosal thickening. Rightward anterior and leftward  posterior nasal septal deviation. Maxillary dentition is largely  absent.    IMPRESSION:  Minimal improvement in pansinus inflammatory changes since January.  Continued bubbly opacity in the left maxillary and right frontal  sinuses.      Electronically Signed  ByCarollee Herter M.D.  On: 10/07/2015 15:41      ENTCT FULL AXIAL OR CORONAL (10/07/2015 3:15 PM)  Procedure Note  Interface, Rad Results In - 10/07/2015 3:44 PM EST  CLINICAL DATA: 82 year old female with cough and recent sinus infection treated with antibiotics. Subsequent encounter.  EXAM: CT PARANASAL SINUS WITHOUT CONTRAST  TECHNIQUE: Multidetector CT images of the paranasal sinuses were obtained using the standard protocol without intravenous contrast.  COMPARISON: [place] limited sinus CT 08/16/2015. Brain MRI 04/29/2013.  FINDINGS: Mild-to-moderate degree of pansinus inflammation was evident in 2014.  Since January, ethmoid and maxillary sinus pneumatization has not significantly improved. The small right maxillary sinus fluid level has resolved but there is continued bubbly opacity in the left maxillary sinus. Subtotal ethmoid opacification, relatively sparing the posterior ethmoids, has not significantly changed.  Frontal sinus bubbly opacity and mucosal thickening has mildly regressed, with residual more so on the right.  Mild sphenoid sinus mucosal thickening is not significantly changed.  Bilateral tympanic cavities and mastoids  remain clear.  Both OMCs  remain opacified (coronal image 27). Fairly symmetric nasal cavity mucosal thickening. Rightward anterior and leftward posterior nasal septal deviation. Maxillary dentition is largely absent.  IMPRESSION: Minimal improvement in pansinus inflammatory changes since January. Continued bubbly opacity in the left maxillary and right frontal sinuses.   Electronically Signed By: Genevie Ann M.D. On: 10/07/2015 15:41    Visit Diagnoses - in this encounter Diagnosis  Chronic pansinusitis - Primary  Other chronic sinusitis   Historical Medications - added in this encounter This list may reflect changes made after this encounter.  Medication Sig. Disp. Refills Start Date End Date  potassium chloride (KLOR-CON) 10 MEQ CR tablet  Take by mouth.      multivitamin capsule  Take 1 capsule by mouth.      loperamide (IMODIUM) 2 mg capsule  Take by mouth.   05/01/2013   ibuprofen (ADVIL,MOTRIN) 200 MG tablet  Take by mouth.      guaiFENesin (ROBITUSSIN) 100 mg/5 mL syrup  Take by mouth.   09/22/2014   fluticasone (FLONASE) 50 mcg/actuation nasal spray  1 spray by Nasal route.      ferrous sulfate (FERATAB) 325 (65 FE) MG tablet  Take by mouth.      albuterol 2.5 mg /3 mL (0.083 %) nebulizer solution  Take 3 mLs (2.5 mg total) by nebulization every 4 (four) hours as needed for wheezing or shortness of breath.   09/22/2014   albuterol 90 mcg/actuation inhaler  Inhale 1-2 puffs into the lungs.   07/09/2015   cholestyramine (QUESTRAN) 4 gram powder  Take by mouth.      furosemide (LASIX) 20 MG tablet     08/19/2015   fluocinonide (LIDEX) 0.05 % cream     09/16/2015   clopidogrel (PLAVIX) 75 mg tablet *ANTIPLATELET*     08/19/2015   alendronate (FOSAMAX) 70 MG tablet

## 2015-12-04 NOTE — Progress Notes (Signed)
Restless, moaning, c/o frontal headache unrelieved with IV febntanyl, orders rec'd for ofirmev

## 2015-12-04 NOTE — Interval H&P Note (Signed)
History and Physical Interval Note:  12/04/2015 11:34 AM  Natasha Ellis  has presented today for surgery, with the diagnosis of CHRONIC SINUSITIS  The various methods of treatment have been discussed with the patient and family. After consideration of risks, benefits and other options for treatment, the patient has consented to  Procedure(s): ENDOSCOPIC SINUS SURGERY (N/A) as a surgical intervention .  The patient's history has been reviewed, patient examined, no change in status, stable for surgery.  I have reviewed the patient's chart and labs.  Questions were answered to the patient's satisfaction.     Montrelle Eddings

## 2015-12-04 NOTE — Anesthesia Procedure Notes (Signed)
Procedure Name: Intubation Date/Time: 12/04/2015 11:52 AM Performed by: Salli Quarry Melinna Linarez Pre-anesthesia Checklist: Patient identified, Emergency Drugs available, Suction available and Patient being monitored Patient Re-evaluated:Patient Re-evaluated prior to inductionOxygen Delivery Method: Circle System Utilized Preoxygenation: Pre-oxygenation with 100% oxygen Intubation Type: IV induction Ventilation: Mask ventilation without difficulty Laryngoscope Size: Mac and 3 Grade View: Grade II Tube type: Oral Tube size: 7.0 mm Number of attempts: 1 Airway Equipment and Method: Stylet Placement Confirmation: ETT inserted through vocal cords under direct vision,  positive ETCO2 and breath sounds checked- equal and bilateral Secured at: 22 cm Tube secured with: Tape Dental Injury: Teeth and Oropharynx as per pre-operative assessment

## 2015-12-04 NOTE — Anesthesia Preprocedure Evaluation (Addendum)
Anesthesia Evaluation  Patient identified by MRN, date of birth, ID band Patient awake    Reviewed: Allergy & Precautions, NPO status , Patient's Chart, lab work & pertinent test results  Airway Mallampati: II  TM Distance: >3 FB Neck ROM: Full    Dental  (+) Teeth Intact, Dental Advisory Given   Pulmonary shortness of breath, asthma , sleep apnea , former smoker,    + rhonchi  + decreased breath sounds      Cardiovascular hypertension, Pt. on medications Normal cardiovascular exam Rhythm:Regular Rate:Normal     Neuro/Psych TIAnegative psych ROS   GI/Hepatic Neg liver ROS, GERD  ,  Endo/Other  Morbid obesity  Renal/GU negative Renal ROS Bladder dysfunction (incontinence)      Musculoskeletal  (+) Arthritis ,   Abdominal   Peds  Hematology  (+) anemia , Lymphoma Waldenstrom's macroglobulinemia    Anesthesia Other Findings Day of surgery medications reviewed with the patient.  Chronic sinusitis   Reproductive/Obstetrics                            Anesthesia Physical Anesthesia Plan  ASA: III  Anesthesia Plan: General   Post-op Pain Management:    Induction: Intravenous  Airway Management Planned: Oral ETT  Additional Equipment:   Intra-op Plan:   Post-operative Plan: Extubation in OR  Informed Consent: I have reviewed the patients History and Physical, chart, labs and discussed the procedure including the risks, benefits and alternatives for the proposed anesthesia with the patient or authorized representative who has indicated his/her understanding and acceptance.   Dental advisory given  Plan Discussed with: CRNA  Anesthesia Plan Comments: (Risks/benefits of general anesthesia discussed with patient including risk of damage to teeth, lips, gum, and tongue, nausea/vomiting, allergic reactions to medications, and the possibility of heart attack, stroke and death.  All  patient questions answered.  Patient wishes to proceed.)        Anesthesia Quick Evaluation

## 2015-12-04 NOTE — Transfer of Care (Signed)
Immediate Anesthesia Transfer of Care Note  Patient: Natasha Ellis  Procedure(s) Performed: Procedure(s): ENDOSCOPIC SINUS SURGERY (N/A)  Patient Location: PACU  Anesthesia Type:General  Level of Consciousness: awake, alert , oriented and patient cooperative  Airway & Oxygen Therapy: Patient Spontanous Breathing and Patient connected to face mask oxygen  Post-op Assessment: Report given to RN and Post -op Vital signs reviewed and stable  Post vital signs: Reviewed and stable  Last Vitals:  Filed Vitals:   12/04/15 0954 12/04/15 1325  BP: 145/51   Pulse: 76   Temp: 36.6 C 36.8 C  Resp: 18     Last Pain: There were no vitals filed for this visit.    Patients Stated Pain Goal: 5 ([id number])  Complications: No apparent anesthesia complications

## 2015-12-04 NOTE — Op Note (Signed)
OPERATIVE REPORT  DATE OF SURGERY: 12/04/2015  PATIENT:  Natasha Ellis,  82 y.o. female  PRE-OPERATIVE DIAGNOSIS:  CHRONIC SINUSITIS  POST-OPERATIVE DIAGNOSIS:  CHRONIC SINUSITIS  PROCEDURE:  Procedure(s): ENDOSCOPIC SINUS SURGERY  SURGEON:  [name], MD  ASSISTANTS: None  ANESTHESIA:   General   EBL:  200 ml  DRAINS: none  LOCAL MEDICATIONS USED:  % Xylocaine with epinephrine  SPECIMEN:  Bilateral nasal and sinus contents  COUNTS:  Correct  PROCEDURE DETAILS: The patient was taken to the operating room and placed on the operating table in the supine position. Following induction of gen. endotracheal anesthesia, The nose was draped in a standard fashion. Afrin spray was used preoperatively in the nasal cavities. 1% Xylocaine with epinephrine was infiltrated into the superior and posterior attachments of the middle turbinates bilaterally.  1.Bilateral endoscopic total ethmoidectomy. Starting with the left and then following with the right side, the 0 and 30 nasal endoscopes were used.The uncinate process was resected using a sickle knife. The bulla was exposed. The microdebrider was then used to perform a complete ethmoidectomy on both sides. Ears diffuse polypoid tissue with thick mucoid secretions and purulent secretions in multiple cells bilaterally. The lateral limit of dissection was the lamina papyracea which was kept intact. The superior limit was the fovea which was also kept intact. Ground lamella was removed to expose the posterior cells.Diseased mucosa was present throughout anterior and posterior cells bilaterally. At the end of the procedure both ethmoid cavities were packed with half Nasal -pore packing.  2. Bilateral endoscopic maxillary antrostomy with removal of tissue. After the bulla was opened the infundibular area was inspected with a 30 scope on both sides and a curved suction was used to enter into through the fontanelle into the maxillary antrum. May was  enlarged anteriorly using backbiting forceps and posteriorly using through cut forceps.A large amount of polypoid mucosa and thick mucoid secretions with some purulence was removed on both sides. 3. Bilateral endoscopic frontal sinusotomy. After the ethmoid dissection was completed on both sides, the 30 endoscope was used to inspect the frontal recess. Diffuse polypoid disease was cleaned out on both sides using the microdebrider and angled forceps. Normal duct was then identified and there was a clear passageway into the frontal sinuses on both sides.  The pharynx was suctioned of blood and secretions. Patient was awakened extubated and transferred to recovery in stable condition.    PATIENT DISPOSITION:  To PACU, stable

## 2015-12-05 ENCOUNTER — Encounter (HOSPITAL_COMMUNITY): Payer: Self-pay | Admitting: Otolaryngology

## 2015-12-05 NOTE — Anesthesia Postprocedure Evaluation (Signed)
Anesthesia Post Note  Patient: Natasha Ellis  Procedure(s) Performed: Procedure(s) (LRB): ENDOSCOPIC SINUS SURGERY (N/A)  Patient location during evaluation: PACU Anesthesia Type: General Level of consciousness: awake and alert Pain management: pain level controlled Vital Signs Assessment: post-procedure vital signs reviewed and stable Respiratory status: spontaneous breathing, nonlabored ventilation, respiratory function stable and patient connected to nasal cannula oxygen Cardiovascular status: blood pressure returned to baseline and stable Postop Assessment: no signs of nausea or vomiting Anesthetic complications: no    Last Vitals:  Filed Vitals:   12/04/15 1515 12/04/15 1531  BP: 132/64 135/64  Pulse: 78 78  Temp:    Resp: 23 20    Last Pain: There were no vitals filed for this visit.               Catalina Gravel

## 2015-12-10 DIAGNOSIS — J322 Chronic ethmoidal sinusitis: Secondary | ICD-10-CM | POA: Diagnosis not present

## 2015-12-16 ENCOUNTER — Telehealth: Payer: Self-pay | Admitting: *Deleted

## 2015-12-16 ENCOUNTER — Telehealth: Payer: Self-pay | Admitting: Oncology

## 2015-12-16 ENCOUNTER — Other Ambulatory Visit (HOSPITAL_BASED_OUTPATIENT_CLINIC_OR_DEPARTMENT_OTHER): Payer: Medicare Other

## 2015-12-16 DIAGNOSIS — C880 Waldenstrom macroglobulinemia: Secondary | ICD-10-CM

## 2015-12-16 LAB — CBC WITH DIFFERENTIAL/PLATELET
BASO%: 0.2 % (ref 0.0–2.0)
Basophils Absolute: 0 10*3/uL (ref 0.0–0.1)
EOS%: 1.7 % (ref 0.0–7.0)
Eosinophils Absolute: 0.1 10*3/uL (ref 0.0–0.5)
HCT: 30.4 % — ABNORMAL LOW (ref 34.8–46.6)
HGB: 9.8 g/dL — ABNORMAL LOW (ref 11.6–15.9)
LYMPH%: 13.5 % — ABNORMAL LOW (ref 14.0–49.7)
MCH: 26.4 pg (ref 25.1–34.0)
MCHC: 32.2 g/dL (ref 31.5–36.0)
MCV: 81.9 fL (ref 79.5–101.0)
MONO#: 0.4 10*3/uL (ref 0.1–0.9)
MONO%: 5.3 % (ref 0.0–14.0)
NEUT#: 6.5 10*3/uL (ref 1.5–6.5)
NEUT%: 79.3 % — ABNORMAL HIGH (ref 38.4–76.8)
Platelets: 292 10*3/uL (ref 145–400)
RBC: 3.71 10*6/uL (ref 3.70–5.45)
RDW: 16.2 % — ABNORMAL HIGH (ref 11.2–14.5)
WBC: 8.2 10*3/uL (ref 3.9–10.3)
lymph#: 1.1 10*3/uL (ref 0.9–3.3)
nRBC: 0 % (ref 0–0)

## 2015-12-16 LAB — COMPREHENSIVE METABOLIC PANEL
ALT: 9 U/L (ref 0–55)
AST: 11 U/L (ref 5–34)
Albumin: 3.2 g/dL — ABNORMAL LOW (ref 3.5–5.0)
Alkaline Phosphatase: 94 U/L (ref 40–150)
Anion Gap: 7 mEq/L (ref 3–11)
BUN: 11.1 mg/dL (ref 7.0–26.0)
CO2: 27 mEq/L (ref 22–29)
Calcium: 9.5 mg/dL (ref 8.4–10.4)
Chloride: 105 mEq/L (ref 98–109)
Creatinine: 0.9 mg/dL (ref 0.6–1.1)
EGFR: 63 mL/min/{1.73_m2} — ABNORMAL LOW (ref >90)
Glucose: 100 mg/dl (ref 70–140)
Potassium: 4 mEq/L (ref 3.5–5.1)
Sodium: 139 mEq/L (ref 136–145)
Total Bilirubin: 0.67 mg/dL (ref 0.20–1.20)
Total Protein: 7.1 g/dL (ref 6.4–8.3)

## 2015-12-16 NOTE — Telephone Encounter (Signed)
Patient called stating that she is experiencing extreme fatigue with not appetitie. She noted that her last [name] lab was high and would like to know if she can come in and get another [name] lab soon. Message forwarded to MD Sherrill/RN Lavella Lemons.

## 2015-12-16 NOTE — Telephone Encounter (Signed)
s.w. pt and advised on todays appt.....pt ok and aware °

## 2015-12-16 NOTE — Addendum Note (Signed)
Addended by: Rosalio Macadamia C on: 12/16/2015 11:01 AM   Modules accepted: Orders

## 2015-12-16 NOTE — Telephone Encounter (Addendum)
Returned call to pt, she reports she has lost about 5 lbs recently. Poor appetite. She is worried about the [name] level. Requesting to have lab checked. Reviewed with Dr. Benay Spice: Orders received for lab today. Will call pt with results and schedule office if needed.

## 2015-12-17 ENCOUNTER — Telehealth: Payer: Self-pay | Admitting: *Deleted

## 2015-12-17 LAB — IGM: IgM, Qn, Serum: 1895 mg/dL — ABNORMAL HIGH (ref 26–217)

## 2015-12-17 NOTE — Telephone Encounter (Signed)
-----   Message from Ladell Pier, MD sent at 12/17/2015  8:25 AM EDT ----- Please call patient, [name] slightly higher, but stable over past year

## 2015-12-17 NOTE — Telephone Encounter (Signed)
Called pt with [name] result: slightly higher, but stable over past year- per Dr. Benay Spice. Per MD: we can work her in to be seen, but symptoms are not likely related to her Waldenstrom's. She voiced understanding. Stated she forgot to mention she has been more short of breath lately. Pt reports she woke up this morning "dripping sweat." She denies fevers or shaking chills. Recommended she contact pulmonologist re: worsening dyspnea. She agrees to do so. Wants to make MD aware she requested to switch PCP to Dr. Lavone Orn. She is waiting for a return call from that office.

## 2015-12-18 ENCOUNTER — Encounter: Payer: Self-pay | Admitting: Pulmonary Disease

## 2015-12-18 ENCOUNTER — Ambulatory Visit (INDEPENDENT_AMBULATORY_CARE_PROVIDER_SITE_OTHER): Payer: Medicare Other | Admitting: Pulmonary Disease

## 2015-12-18 VITALS — BP 116/64 | HR 79 | Ht 66.0 in | Wt 249.8 lb

## 2015-12-18 DIAGNOSIS — R0609 Other forms of dyspnea: Secondary | ICD-10-CM

## 2015-12-18 DIAGNOSIS — G4733 Obstructive sleep apnea (adult) (pediatric): Secondary | ICD-10-CM

## 2015-12-18 DIAGNOSIS — J189 Pneumonia, unspecified organism: Secondary | ICD-10-CM | POA: Diagnosis not present

## 2015-12-18 DIAGNOSIS — R0600 Dyspnea, unspecified: Secondary | ICD-10-CM

## 2015-12-18 NOTE — Progress Notes (Signed)
   Subjective:    Patient ID: Natasha Ellis, female    DOB: Jun 25, 1931, 82 y.o.   MRN: [id number]  HPI  82  year old female. With hx of recurrent pneumonia, chronic cough   - referred by Dr.Sherill Has  OSA -compliant with CPAP 8 cm CHronic cough x 11 years.   - Low grage B cell diagnosed 2004. S/p chemo 2011 at [facility]. 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 Treated with multiple systemic therapies, Last 12/2014    She has a 25 pack-year smoking history -Quit in 1981 Was using only nocutrnal oxygen--this was discontinued in sept 2013 when she moved from [place] to [place]. She was seeing Court Joy in [place].  12/18/2015  Chief Complaint  Patient presents with  . Acute Visit    SOB, DOE, severe fatigue, has not used CPAP since 5/3 because of nasal surgery.  no energy, coughing up bloody mucus, sometimes yellow.no chest tightness, rattling in chest.       91m FU  Accompanied by caregiver  Sputum cx on 11/16 -positive for Moraxella Cat. She was tx w/ 14 days of Levaquin.  She improved for a few weeks and then got sick again after Christmas Saw MW on 12/27  - omnicef x 10ds   Then saw [name] , ENT - extensive sinusitis on CT - took clinda x 1 month - persisted - Underwent sinus surgery 12/04/2015 -Uneventful, bloody drainage has decreased, she has been off CPAP for 2 weeks and reports increasing fatigue Caregiver has noted shortness of breath on walking a few steps Reports cough which is dry Labs repeated by oncology-shows anemia hemoglobin 9.8   CPAP download 10/2015 shows excellent compliance , average pressure 11 cm on auto 8-15 cm , residue AHI of 5/hour  leak ++  Significant tests/ events  04/2013 head CT - Sinus disease with mucosal thickening involving all sinus cavities. echo in 2014 with nml EF , GR 1 DD   08/2015 CT sinus >> Extensive multifocal paranasal sinus disease, most marked in the ethmoid air cell  complexes and in the left maxillary antrum  05/2015 CT chest  showed bilateral lower lobe consolidation  10/2015 ONO shows minimal desaturations; does not need O2.  Review of Systems neg for any significant sore throat, dysphagia, itching, sneezing, nasal congestion or excess/ purulent secretions, fever, chills, sweats, unintended wt loss, pleuritic or exertional cp, hempoptysis, orthopnea pnd or change in chronic leg swelling. Also denies presyncope, palpitations, heartburn, abdominal pain, nausea, vomiting, diarrhea or change in bowel or urinary habits, dysuria,hematuria, rash, arthralgias, visual complaints, headache, numbness weakness or ataxia.     Objective:   Physical Exam  Gen. Pleasant, well-nourished, in no distress ENT - no lesions, no post nasal drip Neck: No JVD, no thyromegaly, no carotid bruits Lungs: no use of accessory muscles, no dullness to percussion, coarse BS without rales or rhonchi  Cardiovascular: Rhythm regular, heart sounds  normal, no murmurs or gallops, no peripheral edema Musculoskeletal: No deformities, no cyanosis or clubbing        Assessment & Plan:

## 2015-12-18 NOTE — Patient Instructions (Signed)
You are fatigue could be multifactorial due to obstructive sleep apnea and anemia Back on CPAP machine If shortness of breath gets worse, fever or yellow sputum- call for chest x-ray and antibiotic

## 2015-12-19 NOTE — Assessment & Plan Note (Signed)
You are fatigue could be multifactorial due to obstructive sleep apnea and anemia If shortness of breath gets worse, fever or yellow sputum- call for chest x-ray and antibiotic

## 2015-12-19 NOTE — Assessment & Plan Note (Signed)
Pl get Back on CPAP machine

## 2015-12-24 DIAGNOSIS — H903 Sensorineural hearing loss, bilateral: Secondary | ICD-10-CM | POA: Diagnosis not present

## 2015-12-24 DIAGNOSIS — J324 Chronic pansinusitis: Secondary | ICD-10-CM | POA: Diagnosis not present

## 2015-12-24 DIAGNOSIS — H906 Mixed conductive and sensorineural hearing loss, bilateral: Secondary | ICD-10-CM | POA: Diagnosis not present

## 2015-12-25 DIAGNOSIS — L905 Scar conditions and fibrosis of skin: Secondary | ICD-10-CM | POA: Diagnosis not present

## 2015-12-25 DIAGNOSIS — M25561 Pain in right knee: Secondary | ICD-10-CM | POA: Diagnosis not present

## 2015-12-25 DIAGNOSIS — D1801 Hemangioma of skin and subcutaneous tissue: Secondary | ICD-10-CM | POA: Diagnosis not present

## 2015-12-25 DIAGNOSIS — L814 Other melanin hyperpigmentation: Secondary | ICD-10-CM | POA: Diagnosis not present

## 2015-12-25 DIAGNOSIS — M1712 Unilateral primary osteoarthritis, left knee: Secondary | ICD-10-CM | POA: Diagnosis not present

## 2015-12-25 DIAGNOSIS — L570 Actinic keratosis: Secondary | ICD-10-CM | POA: Diagnosis not present

## 2015-12-25 DIAGNOSIS — L820 Inflamed seborrheic keratosis: Secondary | ICD-10-CM | POA: Diagnosis not present

## 2016-01-01 ENCOUNTER — Telehealth: Payer: Self-pay | Admitting: Pulmonary Disease

## 2016-01-01 ENCOUNTER — Telehealth: Payer: Self-pay | Admitting: Oncology

## 2016-01-01 NOTE — Telephone Encounter (Signed)
per pof to sch pt appt-cld & spoke w/pt and adv of time & date of appt for 6/2@10  per Tanya-pof

## 2016-01-01 NOTE — Telephone Encounter (Signed)
Spoke with pt. She is wanting to know if RA thinks it would be a good idea to by a new product by the name of "Go Clean." It's a new cleaning product that cleans CPAP machines. Advised pt that RA is on vacation, she is fine with a response next week.  RA - please advise. Thanks.

## 2016-01-02 DIAGNOSIS — M1712 Unilateral primary osteoarthritis, left knee: Secondary | ICD-10-CM | POA: Diagnosis not present

## 2016-01-03 ENCOUNTER — Other Ambulatory Visit: Payer: Medicare Other

## 2016-01-03 ENCOUNTER — Ambulatory Visit (HOSPITAL_BASED_OUTPATIENT_CLINIC_OR_DEPARTMENT_OTHER): Payer: Medicare Other | Admitting: Oncology

## 2016-01-03 ENCOUNTER — Telehealth: Payer: Self-pay | Admitting: Oncology

## 2016-01-03 VITALS — BP 151/51 | HR 76 | Temp 97.9°F | Resp 18 | Ht 66.0 in | Wt 246.9 lb

## 2016-01-03 DIAGNOSIS — C8510 Unspecified B-cell lymphoma, unspecified site: Secondary | ICD-10-CM

## 2016-01-03 DIAGNOSIS — C9110 Chronic lymphocytic leukemia of B-cell type not having achieved remission: Secondary | ICD-10-CM | POA: Diagnosis not present

## 2016-01-03 DIAGNOSIS — C880 Waldenstrom macroglobulinemia: Secondary | ICD-10-CM

## 2016-01-03 DIAGNOSIS — R197 Diarrhea, unspecified: Secondary | ICD-10-CM

## 2016-01-03 DIAGNOSIS — R634 Abnormal weight loss: Secondary | ICD-10-CM | POA: Diagnosis not present

## 2016-01-03 DIAGNOSIS — D649 Anemia, unspecified: Secondary | ICD-10-CM

## 2016-01-03 NOTE — Progress Notes (Signed)
  [place] OFFICE PROGRESS NOTE   Diagnosis: Waldenstrm's macroglobulinemia  INTERVAL HISTORY:   Ms. Boro presents prior to a scheduled visit. She underwent surgery for chronic sinusitis on 12/04/2015. She reports tolerating the procedure well. The pathology from sinus contents revealed no evidence of malignancy. She continues to have a cough. She complains of fatigue, anorexia, and early satiety. She has had diarrhea for the past week up to 5 times per day. She has a low-grade fever in the evening. She saw Dr. Elsworth Soho on 12/18/2015.  Objective:  Vital signs in last 24 hours:  Blood pressure 151/51, pulse 76, temperature 97.9 F (36.6 C), temperature source Oral, resp. rate 18, height 5\' 6"  (1.676 m), weight 246 lb 14.4 oz (111.993 kg), SpO2 98 %.    HEENT: Neck without mass Lymphatics: No cervical, supraclavicular, axillary, or inguinal nodes. Soft mobile lymph node versus prominent left submandibular gland. Resp: Scattered bilateral inspiratory rhonchi and expiratory wheeze. No respiratory distress Cardio: Regular rate and rhythm GI: Mild tenderness in the low abdomen bilaterally, no hepatosplenomegaly, no mass Vascular: No leg edema   Lab Results:  Lab Results  Component Value Date   WBC 8.2 12/16/2015   HGB 9.8* 12/16/2015   HCT 30.4* 12/16/2015   MCV 81.9 12/16/2015   PLT 292 12/16/2015   NEUTROABS 6.5 12/16/2015  Creatinine 0.9, calcium 9.5, albumin 3.2 [name] 1895  Medications: I have reviewed the patient's current medications.  Assessment/Plan: 1. 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 Treated with multiple systemic therapies includingpentostatin/Cytoxan/rituximab in 2010.   Cycle 1 bendamustine/Rituxan 05/31/2014.  Cycle 2 bendamustine/Rituxan 07/02/2014  Cycle  3 bendamustine/rituximab 07/30/2014  Cycle 4 bendamustine/rituximab 09/10/2014  Cycle 5 bendamustine/rituximab 10/22/2014  Cycle 6 bendamustine/rituximab 12/10/2014 2. Left knee arthritis. Followed by Dr. Wynelle Link. 3. Sleep apnea. 4. Anemia. Now microcytic with peripheral blood smear findings consistent with iron deficiency 07/09/2015, serum iron studies consistent with "anemia of chronic disease ", stool Hemoccult cards negative 07/11/2015 5. Question left parotitis 12/12/2013 presenting with left facial and parotid edema. 6. Admission with pneumonia February 2016 7. History of Neutropenia secondary to chemotherapy, now with moderate neutropenia 8. Left lung pneumonia 04/02/2015, sputum culture positive for Moraxella 06/19/2015 9. Low IgG and IgA 10. CT chest 05/13/2015 with bilateral lower lung consolidation 11. Sinus mucosal thickening/opacification on CT 08/16/2015-status post sinus surgery 12/05/2015   Disposition:  Ms. Hilling has a history of Waldenstrm's macroglobulinemia. The [name] level has not changed significantly over the past year and remains lower compared to when she was treated for the lymphoma. She has persistent mild anemia. There has been recent weight loss.  The etiology of her current symptoms is unclear. We will check a stool sample for the C. difficile toxin today. She will be referred for CTs of the chest, abdomen, and pelvis to look for evidence of active lymphoma and infection.  Ms. Rubinson will return for an office visit in one week.    [name], MD  01/03/2016  11:47 AM

## 2016-01-03 NOTE — Progress Notes (Signed)
Pt could not provide stool sample in the office. Stool collection kit and written order provided to take sample to hospital after office hours if needed. She understands to bring sample to office if she has a stool before lab closes today.

## 2016-01-03 NOTE — Telephone Encounter (Signed)
spoke w/ pt, pt left for the day and will do lab on monday 6/5.. pt advised to get barium in scheduling for scan, pt is aware of 6/9 apt

## 2016-01-04 ENCOUNTER — Other Ambulatory Visit (HOSPITAL_COMMUNITY)
Admission: RE | Admit: 2016-01-04 | Discharge: 2016-01-04 | Disposition: A | Discharge status: Home / Self Care | Payer: Medicare Other | Source: Other Acute Inpatient Hospital | Attending: Oncology | Admitting: Oncology

## 2016-01-06 ENCOUNTER — Encounter: Payer: Self-pay | Admitting: *Deleted

## 2016-01-06 ENCOUNTER — Other Ambulatory Visit: Payer: Medicare Other

## 2016-01-06 ENCOUNTER — Other Ambulatory Visit: Payer: Self-pay | Admitting: *Deleted

## 2016-01-06 NOTE — Telephone Encounter (Signed)
Okay to do so Not absolutely necessary

## 2016-01-06 NOTE — Progress Notes (Signed)
Pt notified that the c-diff sample that she brought in on 01/04/16 was too formed to test per lab.  Pt states that her last BM was yesterday and was slightly formed at that time.  Pt has no questions or concerns at this time and is appreciative of call.

## 2016-01-07 DIAGNOSIS — M1712 Unilateral primary osteoarthritis, left knee: Secondary | ICD-10-CM | POA: Diagnosis not present

## 2016-01-08 ENCOUNTER — Ambulatory Visit (HOSPITAL_COMMUNITY)
Admission: RE | Admit: 2016-01-08 | Discharge: 2016-01-08 | Disposition: A | Discharge status: Home / Self Care | Payer: Medicare Other | Source: Ambulatory Visit | Attending: Oncology | Admitting: Oncology

## 2016-01-08 ENCOUNTER — Encounter (HOSPITAL_COMMUNITY): Payer: Self-pay

## 2016-01-08 DIAGNOSIS — Z8572 Personal history of non-Hodgkin lymphomas: Secondary | ICD-10-CM | POA: Diagnosis not present

## 2016-01-08 DIAGNOSIS — R918 Other nonspecific abnormal finding of lung field: Secondary | ICD-10-CM | POA: Insufficient documentation

## 2016-01-08 DIAGNOSIS — C8510 Unspecified B-cell lymphoma, unspecified site: Secondary | ICD-10-CM | POA: Diagnosis not present

## 2016-01-08 DIAGNOSIS — I700 Atherosclerosis of aorta: Secondary | ICD-10-CM | POA: Insufficient documentation

## 2016-01-08 MED ORDER — IOPAMIDOL (ISOVUE-300) INJECTION 61%
100.0000 mL | Freq: Once | INTRAVENOUS | Status: AC | PRN
  Administered 2016-01-08: 100 mL via INTRAVENOUS

## 2016-01-08 NOTE — Telephone Encounter (Signed)
Spoke with the pt and notified of response  She wanted RA to know that she is having ct abd/pelvis today in case he wanted to look at it  Will forward back to him as [place]

## 2016-01-10 ENCOUNTER — Ambulatory Visit: Payer: Medicare Other

## 2016-01-10 ENCOUNTER — Ambulatory Visit (HOSPITAL_BASED_OUTPATIENT_CLINIC_OR_DEPARTMENT_OTHER): Payer: Medicare Other | Admitting: Oncology

## 2016-01-10 VITALS — BP 118/62 | HR 85 | Temp 98.2°F | Resp 18 | Wt 247.6 lb

## 2016-01-10 DIAGNOSIS — R05 Cough: Secondary | ICD-10-CM | POA: Diagnosis not present

## 2016-01-10 DIAGNOSIS — C8588 Other specified types of non-Hodgkin lymphoma, lymph nodes of multiple sites: Secondary | ICD-10-CM | POA: Diagnosis not present

## 2016-01-10 DIAGNOSIS — C880 Waldenstrom macroglobulinemia: Secondary | ICD-10-CM

## 2016-01-10 DIAGNOSIS — C8350 Lymphoblastic (diffuse) lymphoma, unspecified site: Secondary | ICD-10-CM | POA: Diagnosis not present

## 2016-01-10 DIAGNOSIS — R5381 Other malaise: Secondary | ICD-10-CM | POA: Diagnosis not present

## 2016-01-10 NOTE — Telephone Encounter (Signed)
Reviewed-she has persistent markings in both lower lobes of her lungs Would recommend bronchoscopy if she is still symptomatic

## 2016-01-10 NOTE — Progress Notes (Signed)
[place] OFFICE PROGRESS NOTE   Diagnosis: Waldenstrm's macroglobulinemia  INTERVAL HISTORY:   Natasha Ellis returns as scheduled. She continues to complain of malaise. She has a productive cough. She has 3-4 loose stools per day. The stool sample she returned last week was formed and C. difficile testing was not performed. She reports having C. difficile colitis in the past and feels the stool is different at present.    Objective:  Vital signs in last 24 hours:  Blood pressure 145/59, pulse 85, temperature 98.2 F (36.8 C), temperature source Oral, resp. rate 18, weight 247 lb 9.6 oz (112.311 kg), SpO2 96 %.    Resp: Scattered bilateral inspiratory/expiratory rhonchi and wheezes Cardio: Regular rate and rhythm GI: No hepatosplenomegaly, nontender, no mass Vascular: No leg edema   Lab Results:  Lab Results  Component Value Date   WBC 8.2 12/16/2015   HGB 9.8* 12/16/2015   HCT 30.4* 12/16/2015   MCV 81.9 12/16/2015   PLT 292 12/16/2015   NEUTROABS 6.5 12/16/2015      Imaging:  Ct Chest W Contrast  01/08/2016  CLINICAL DATA:  Non-Hodgkin's lymphoma diagnosed 2004. Chemotherapy complete EXAM: CT CHEST, ABDOMEN, AND PELVIS WITH CONTRAST TECHNIQUE: Multidetector CT imaging of the chest, abdomen and pelvis was performed following the standard protocol during bolus administration of intravenous contrast. CONTRAST:  185mL ISOVUE-300 IOPAMIDOL (ISOVUE-300) INJECTION 61% COMPARISON:  Chest CT 05/13/2015 FINDINGS: CT CHEST FINDINGS Mediastinum/Nodes: No axillary or supraclavicular lymphadenopathy. No mediastinal hilar adenopathy. No pericardial fluid. Coronary calcifications present. Esophagus normal. Lungs/Pleura: Ground-glass nodule in the RIGHT upper lobe measures 11 mm (image 30, series 4) and is not changed in size from CT 05/13/2015. Bilateral lower lobe peribronchial thickening in a segmental pattern. Mild peripheral tree-in-bud nodularity in the lower lobes.  This is new from comparison exam Musculoskeletal: No aggressive osseous lesion. CT ABDOMEN AND PELVIS FINDINGS Hepatobiliary:  No focal hepatic lesion.  Multiple gallstones. Pancreas: Pancreas is normal. No ductal dilatation. No pancreatic inflammation. Go Spleen: Spleen is normal volume. Low-density lesion in the spleen measuring 9 mm cannot fully characterized. 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 RIGHT adrenal gland normal. Kidneys, ureters and bladder normal no Stomach/Bowel: Stomach, small bowel, appendix, and cecum are normal. The colon and rectosigmoid colon are normal. Vascular/Lymphatic: Abdominal aorta is normal caliber with atherosclerotic calcification. There is no retroperitoneal or periportal lymphadenopathy. No pelvic lymphadenopathy. Reproductive: Post hysterectomy anatomy Other: No free fluid. Musculoskeletal: No aggressive osseous lesion. IMPRESSION: Chest Impression: 1. New peribronchial thickening and peripheral tree-in-bud pattern in the RIGHT lower lobe suggests infectious or inflammatory process. Aspiration pneumonitis could have a similar pattern. Cannot exclude lymphangitic spread of lymphoma. Recommend pulmonology consultation. 2. Stable ground-glass nodule in the RIGHT upper lobe. Recommend follow-up CT in 1 year. Abdomen / Pelvis Impression: 1. No evidence of lymphoma in the abdomen or pelvis. 2.  Atherosclerotic calcification of the abdominal aorta. Electronically Signed   By: Suzy Bouchard M.D.   On: 01/08/2016 17:04   Ct Abdomen Pelvis W Contrast  01/08/2016  CLINICAL DATA:  Non-Hodgkin's lymphoma diagnosed 2004. Chemotherapy complete EXAM: CT CHEST, ABDOMEN, AND PELVIS WITH CONTRAST TECHNIQUE: Multidetector CT imaging of the chest, abdomen and pelvis was performed following the standard protocol during bolus administration of intravenous contrast. CONTRAST:  137mL ISOVUE-300 IOPAMIDOL (ISOVUE-300) INJECTION 61% COMPARISON:  Chest CT 05/13/2015  FINDINGS: CT CHEST FINDINGS Mediastinum/Nodes: No axillary or supraclavicular lymphadenopathy. No mediastinal hilar adenopathy. No pericardial fluid. Coronary calcifications present. Esophagus normal. Lungs/Pleura:  Ground-glass nodule in the RIGHT upper lobe measures 11 mm (image 30, series 4) and is not changed in size from CT 05/13/2015. Bilateral lower lobe peribronchial thickening in a segmental pattern. Mild peripheral tree-in-bud nodularity in the lower lobes. This is new from comparison exam Musculoskeletal: No aggressive osseous lesion. CT ABDOMEN AND PELVIS FINDINGS Hepatobiliary:  No focal hepatic lesion.  Multiple gallstones. Pancreas: Pancreas is normal. No ductal dilatation. No pancreatic inflammation. Go Spleen: Spleen is normal volume. Low-density lesion in the spleen measuring 9 mm cannot fully characterized. 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 RIGHT adrenal gland normal. Kidneys, ureters and bladder normal no Stomach/Bowel: Stomach, small bowel, appendix, and cecum are normal. The colon and rectosigmoid colon are normal. Vascular/Lymphatic: Abdominal aorta is normal caliber with atherosclerotic calcification. There is no retroperitoneal or periportal lymphadenopathy. No pelvic lymphadenopathy. Reproductive: Post hysterectomy anatomy Other: No free fluid. Musculoskeletal: No aggressive osseous lesion. IMPRESSION: Chest Impression: 1. New peribronchial thickening and peripheral tree-in-bud pattern in the RIGHT lower lobe suggests infectious or inflammatory process. Aspiration pneumonitis could have a similar pattern. Cannot exclude lymphangitic spread of lymphoma. Recommend pulmonology consultation. 2. Stable ground-glass nodule in the RIGHT upper lobe. Recommend follow-up CT in 1 year. Abdomen / Pelvis Impression: 1. No evidence of lymphoma in the abdomen or pelvis. 2.  Atherosclerotic calcification of the abdominal aorta. Electronically Signed   By: Suzy Bouchard  M.D.   On: 01/08/2016 17:04   CT images were reviewed. Medications: I have reviewed the patient's current medications.  Assessment/Plan: 1. 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 Treated with multiple systemic therapies includingpentostatin/Cytoxan/rituximab in 2010.   Cycle 1 bendamustine/Rituxan 05/31/2014.  Cycle 2 bendamustine/Rituxan 07/02/2014  Cycle 3 bendamustine/rituximab 07/30/2014  Cycle 4 bendamustine/rituximab 09/10/2014  Cycle 5 bendamustine/rituximab 10/22/2014  Cycle 6 bendamustine/rituximab 12/10/2014 2. Left knee arthritis. Followed by Dr. Wynelle Link. 3. Sleep apnea. 4. Anemia. Now microcytic with peripheral blood smear findings consistent with iron deficiency 07/09/2015, serum iron studies consistent with "anemia of chronic disease ", stool Hemoccult cards negative 07/11/2015 5. Question left parotitis 12/12/2013 presenting with left facial and parotid edema. 6. Admission with pneumonia February 2016 7. History of Neutropenia secondary to chemotherapy, now with moderate neutropenia 8. Left lung pneumonia 04/02/2015, sputum culture positive for Moraxella 06/19/2015 9. Low IgG and IgA 10. CT chest 05/13/2015 with bilateral lower lung consolidation 11. CT chest, abdomen, and pelvis 01/08/2016-new peribronchial thickening and peripheral tree in bud pattern at the right lower lobe, no lymphadenopathy 12. Sinus mucosal thickening/opacification on CT 08/16/2015-status post sinus surgery 12/05/2015   Disposition:  Natasha Ellis continues to have malaise and a productive cough. I suspect her symptoms are related to a respiratory infection. We submitted a sputum sample for Gram stain and culture today. I discussed the case with Dr. Elsworth Soho. He will schedule her for an appointment and consider a  bronchoscopy.  I doubt her symptoms are related to the Waldenstrm's macroglobulinemia. No lymphadenopathy noted on the CT scans. The weight loss is also most likely related to the respiratory process.  We will continue following her closely and consider treatment of the Waldenstrm's as indicated.  She will return for an office and lab visit in 2 weeks.  [name], MD  01/10/2016  10:07 AM

## 2016-01-12 NOTE — Telephone Encounter (Signed)
Pl schedule Ov to discuss CT results

## 2016-01-13 ENCOUNTER — Encounter (HOSPITAL_COMMUNITY): Payer: Self-pay | Admitting: *Deleted

## 2016-01-13 ENCOUNTER — Emergency Department (HOSPITAL_COMMUNITY)
Admission: EM | Admit: 2016-01-13 | Discharge: 2016-01-14 | Disposition: A | Discharge status: Home / Self Care | Payer: Medicare Other | Attending: Emergency Medicine | Admitting: Emergency Medicine

## 2016-01-13 DIAGNOSIS — Y939 Activity, unspecified: Secondary | ICD-10-CM | POA: Diagnosis not present

## 2016-01-13 DIAGNOSIS — S20221A Contusion of right back wall of thorax, initial encounter: Secondary | ICD-10-CM | POA: Diagnosis not present

## 2016-01-13 DIAGNOSIS — Z791 Long term (current) use of non-steroidal anti-inflammatories (NSAID): Secondary | ICD-10-CM | POA: Diagnosis not present

## 2016-01-13 DIAGNOSIS — J45909 Unspecified asthma, uncomplicated: Secondary | ICD-10-CM | POA: Diagnosis not present

## 2016-01-13 DIAGNOSIS — S0990XA Unspecified injury of head, initial encounter: Secondary | ICD-10-CM | POA: Diagnosis not present

## 2016-01-13 DIAGNOSIS — T148 Other injury of unspecified body region: Secondary | ICD-10-CM | POA: Diagnosis not present

## 2016-01-13 DIAGNOSIS — S50311A Abrasion of right elbow, initial encounter: Secondary | ICD-10-CM

## 2016-01-13 DIAGNOSIS — Z87891 Personal history of nicotine dependence: Secondary | ICD-10-CM | POA: Insufficient documentation

## 2016-01-13 DIAGNOSIS — S298XXA Other specified injuries of thorax, initial encounter: Secondary | ICD-10-CM

## 2016-01-13 DIAGNOSIS — T07 Unspecified multiple injuries: Secondary | ICD-10-CM | POA: Diagnosis not present

## 2016-01-13 DIAGNOSIS — Z859 Personal history of malignant neoplasm, unspecified: Secondary | ICD-10-CM | POA: Diagnosis not present

## 2016-01-13 DIAGNOSIS — W1830XA Fall on same level, unspecified, initial encounter: Secondary | ICD-10-CM | POA: Diagnosis not present

## 2016-01-13 DIAGNOSIS — S20211A Contusion of right front wall of thorax, initial encounter: Secondary | ICD-10-CM

## 2016-01-13 DIAGNOSIS — Z79899 Other long term (current) drug therapy: Secondary | ICD-10-CM | POA: Insufficient documentation

## 2016-01-13 DIAGNOSIS — Y929 Unspecified place or not applicable: Secondary | ICD-10-CM | POA: Insufficient documentation

## 2016-01-13 DIAGNOSIS — R0781 Pleurodynia: Secondary | ICD-10-CM | POA: Diagnosis not present

## 2016-01-13 DIAGNOSIS — I10 Essential (primary) hypertension: Secondary | ICD-10-CM | POA: Insufficient documentation

## 2016-01-13 DIAGNOSIS — Y999 Unspecified external cause status: Secondary | ICD-10-CM | POA: Diagnosis not present

## 2016-01-13 DIAGNOSIS — S299XXA Unspecified injury of thorax, initial encounter: Secondary | ICD-10-CM | POA: Diagnosis not present

## 2016-01-13 NOTE — ED Notes (Signed)
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 Pt is on blood thinners. Fall happened around 2200. Pt denies headache, no LOC. Pt reports mid back pain. Pt A&Ox4, respirations equal and unlabored, skin warm and dry

## 2016-01-14 ENCOUNTER — Emergency Department (HOSPITAL_COMMUNITY): Payer: Medicare Other

## 2016-01-14 DIAGNOSIS — Z6841 Body Mass Index (BMI) 40.0 and over, adult: Secondary | ICD-10-CM | POA: Diagnosis not present

## 2016-01-14 DIAGNOSIS — S299XXA Unspecified injury of thorax, initial encounter: Secondary | ICD-10-CM | POA: Diagnosis not present

## 2016-01-14 DIAGNOSIS — R0781 Pleurodynia: Secondary | ICD-10-CM | POA: Diagnosis not present

## 2016-01-14 DIAGNOSIS — R197 Diarrhea, unspecified: Secondary | ICD-10-CM | POA: Diagnosis not present

## 2016-01-14 DIAGNOSIS — Z8673 Personal history of transient ischemic attack (TIA), and cerebral infarction without residual deficits: Secondary | ICD-10-CM | POA: Diagnosis not present

## 2016-01-14 DIAGNOSIS — M8580 Other specified disorders of bone density and structure, unspecified site: Secondary | ICD-10-CM | POA: Diagnosis not present

## 2016-01-14 DIAGNOSIS — S0990XA Unspecified injury of head, initial encounter: Secondary | ICD-10-CM | POA: Diagnosis not present
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
## 2016-01-14 NOTE — Discharge Instructions (Signed)
Please read and follow all provided instructions.  Your diagnoses today include:  1. Contusion of ribs, right, initial encounter   2. Abrasion of right elbow, initial encounter   3. Minor head injury, initial encounter     Tests performed today include:  CT scan of your head that did not show any serious injury.  X-ray of your chest and ribs - no broken ribs  Vital signs. See below for your results today.   Medications prescribed:   None  Take any prescribed medications only as directed.  Home care instructions:  Follow any educational materials contained in this packet.  Take 10 deep breaths every hour while awake. This helps to expand your lungs and prevent infections like pneumonia.   Follow-up instructions: Please follow-up with your primary care provider in the next 3 days for further evaluation of your symptoms if you are not feeling better.   Return instructions:  SEEK IMMEDIATE MEDICAL ATTENTION IF:  There is confusion or drowsiness (although children frequently become drowsy after injury).   You cannot awaken the injured person.   You have more than one episode of vomiting.   You notice dizziness or unsteadiness which is getting worse, or inability to walk.   You have convulsions or unconsciousness.   You experience severe, persistent headaches not relieved by Tylenol.  You cannot use arms or legs normally.   There are changes in pupil sizes. (This is the black center in the colored part of the eye)   There is clear or bloody discharge from the nose or ears.   You have change in speech, vision, swallowing, or understanding.   Localized weakness, numbness, tingling, or change in bowel or bladder control.  You have any other emergent concerns.  Additional Information: You have had a head injury which does not appear to require admission at this time.  Your vital signs today were: BP 158/66 mmHg   Pulse 68   Temp(Src) 98.2 F (36.8 C) (Oral)   Resp  20   SpO2 96% If your blood pressure (BP) was elevated above 135/85 this visit, please have this repeated by your doctor within one month. --------------

## 2016-01-14 NOTE — ED Notes (Signed)
Pt up to the restroom using walker

## 2016-01-14 NOTE — Telephone Encounter (Signed)
lmtcb x1 for pt to schedule an appointment.

## 2016-01-14 NOTE — ED Provider Notes (Signed)
Medical screening examination/treatment/procedure(s) were conducted as a shared visit with non-physician practitioner(s) and myself.  I personally evaluated the patient during the encounter.   EKG Interpretation None      Pt is a 82 y.o. female who presents to the emergency department after mechanical fall. Hit her head and posterior right ribs when she fell backwards. No loss of consciousness. No neurologic deficits. Is on Plavix. X-ray shows no fracture, pneumothorax. CT of her head shows no acute intracranial injury, fracture. She is still in her neurologic baseline. Declines pain medication. Will discharge home with return precautions. Patient and daughter at bedside are comfortable with this plan.  [place], DO 01/14/16 [phone]

## 2016-01-15 ENCOUNTER — Telehealth: Payer: Self-pay | Admitting: *Deleted

## 2016-01-15 ENCOUNTER — Telehealth: Payer: Self-pay | Admitting: Pulmonary Disease

## 2016-01-15 LAB — LOWER RESPIRATORY CULTURE

## 2016-01-15 NOTE — Telephone Encounter (Signed)
LVM for pt to return call

## 2016-01-15 NOTE — Telephone Encounter (Signed)
Message from pt asking if Dr. Benay Spice has discussed case with Dr. Elsworth Soho yet. YES, it looks like office has tried to contact pt. Instructed her to call pulmonologist to schedule appt. She agreed to do so.

## 2016-01-16 NOTE — Telephone Encounter (Signed)
Spoke with pt. Her appointment has been moved to 01/21/16 at 10:45am. Nothing further was needed.

## 2016-01-17 ENCOUNTER — Other Ambulatory Visit: Payer: Self-pay | Admitting: *Deleted

## 2016-01-17 MED ORDER — CIPROFLOXACIN HCL 500 MG PO TABS: 500.0000 mg | ORAL_TABLET | Freq: Two times a day (BID) | ORAL | Status: DC

## 2016-01-17 NOTE — Telephone Encounter (Signed)
Pt returned call, Cipro instructions reviewed. She voiced understanding.

## 2016-01-17 NOTE — Telephone Encounter (Signed)
Per Dr. Benay Spice: Culture positive for psuedomonas. Start Cipro 500 mg BID for 10 days. Rx sent to pharmacy. Left message on voicemail for pt to call office.

## 2016-01-21 ENCOUNTER — Ambulatory Visit (INDEPENDENT_AMBULATORY_CARE_PROVIDER_SITE_OTHER): Payer: Medicare Other | Admitting: Adult Health

## 2016-01-21 ENCOUNTER — Encounter: Payer: Self-pay | Admitting: Adult Health

## 2016-01-21 VITALS — BP 124/82 | HR 75 | Ht 66.0 in | Wt 250.0 lb

## 2016-01-21 DIAGNOSIS — J189 Pneumonia, unspecified organism: Secondary | ICD-10-CM | POA: Diagnosis not present

## 2016-01-21 NOTE — Patient Instructions (Addendum)
Finish Cipro as directed.  Mucinex Twice daily  As needed  Cough/congestion .  Eat yogurt daily .  Follow up with Dr. Elsworth Soho  In 3  weeks with chest xray  Please contact office for sooner follow up if symptoms do not improve or worsen or seek emergency care

## 2016-01-24 ENCOUNTER — Other Ambulatory Visit: Payer: Medicare Other

## 2016-01-24 ENCOUNTER — Ambulatory Visit (HOSPITAL_BASED_OUTPATIENT_CLINIC_OR_DEPARTMENT_OTHER): Payer: Medicare Other | Admitting: Oncology

## 2016-01-24 ENCOUNTER — Telehealth: Payer: Self-pay | Admitting: Oncology

## 2016-01-24 VITALS — BP 128/64 | HR 69 | Temp 97.9°F | Resp 18 | Ht 66.0 in | Wt 251.9 lb

## 2016-01-24 DIAGNOSIS — D638 Anemia in other chronic diseases classified elsewhere: Secondary | ICD-10-CM

## 2016-01-24 DIAGNOSIS — C880 Waldenstrom macroglobulinemia: Secondary | ICD-10-CM

## 2016-01-24 DIAGNOSIS — J189 Pneumonia, unspecified organism: Secondary | ICD-10-CM

## 2016-01-24 LAB — COMPREHENSIVE METABOLIC PANEL
ALT: 11 U/L (ref 0–55)
AST: 14 U/L (ref 5–34)
Albumin: 3.1 g/dL — ABNORMAL LOW (ref 3.5–5.0)
Alkaline Phosphatase: 70 U/L (ref 40–150)
Anion Gap: 9 mEq/L (ref 3–11)
BUN: 11.8 mg/dL (ref 7.0–26.0)
CO2: 25 mEq/L (ref 22–29)
Calcium: 9.4 mg/dL (ref 8.4–10.4)
Chloride: 110 mEq/L — ABNORMAL HIGH (ref 98–109)
Creatinine: 0.9 mg/dL (ref 0.6–1.1)
EGFR: 61 mL/min/{1.73_m2} — ABNORMAL LOW (ref >90)
Glucose: 99 mg/dl (ref 70–140)
Potassium: 4.4 mEq/L (ref 3.5–5.1)
Sodium: 144 mEq/L (ref 136–145)
Total Bilirubin: 0.6 mg/dL (ref 0.20–1.20)
Total Protein: 6.5 g/dL (ref 6.4–8.3)

## 2016-01-24 LAB — CBC WITH DIFFERENTIAL/PLATELET
BASO%: 0.7 % (ref 0.0–2.0)
Basophils Absolute: 0 10*3/uL (ref 0.0–0.1)
EOS%: 1.4 % (ref 0.0–7.0)
Eosinophils Absolute: 0 10*3/uL (ref 0.0–0.5)
HCT: 28.9 % — ABNORMAL LOW (ref 34.8–46.6)
HGB: 9.5 g/dL — ABNORMAL LOW (ref 11.6–15.9)
LYMPH%: 24.2 % (ref 14.0–49.7)
MCH: 26.5 pg (ref 25.1–34.0)
MCHC: 32.7 g/dL (ref 31.5–36.0)
MCV: 81 fL (ref 79.5–101.0)
MONO#: 0.3 10*3/uL (ref 0.1–0.9)
MONO%: 7.4 % (ref 0.0–14.0)
NEUT#: 2.4 10*3/uL (ref 1.5–6.5)
NEUT%: 66.3 % (ref 38.4–76.8)
Platelets: 242 10*3/uL (ref 145–400)
RBC: 3.57 10*6/uL — ABNORMAL LOW (ref 3.70–5.45)
RDW: 20 % — ABNORMAL HIGH (ref 11.2–14.5)
WBC: 3.6 10*3/uL — ABNORMAL LOW (ref 3.9–10.3)
lymph#: 0.9 10*3/uL (ref 0.9–3.3)

## 2016-01-24 NOTE — Progress Notes (Signed)
  [place] OFFICE PROGRESS NOTE   Diagnosis: Waldenstrm's macroglobulinemia  INTERVAL HISTORY:   Natasha Ellis returns as scheduled. A sputum culture on 01/10/2016 returned with heavy growth of pseudomonas aeruginosa. She is completing a course of ciprofloxacin. She feels much better. The cough has improved. She has an improved energy level.  She fell while attending her grandson's graduation on 01/13/2016. A CT of the head showed no acute injury.  The diarrhea has improved. She was started on probiotic by pulmonary medicine.  Objective:  Vital signs in last 24 hours:  Blood pressure 144/47, pulse 69, temperature 97.9 F (36.6 C), temperature source Oral, resp. rate 18, height 5\' 6"  (1.676 m), weight 251 lb 14.4 oz (114.261 kg), SpO2 99 %.  Resp: Bilateral inspiratory/expiratory rhonchi, no respiratory distress Cardio: Regular rate and rhythm GI: No hepatosplenomegaly Vascular: No leg edema   Lab Results:  Lab Results  Component Value Date   WBC 3.6* 01/24/2016   HGB 9.5* 01/24/2016   HCT 28.9* 01/24/2016   MCV 81.0 01/24/2016   PLT 242 01/24/2016   NEUTROABS 2.4 01/24/2016   BUN 11.8, currently 0.9  12/16/2015-[name] 1895  Medications: I have reviewed the patient's current medications.  Assessment/Plan: 1. 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 Treated with multiple systemic therapies includingpentostatin/Cytoxan/rituximab in 2010.   Cycle 1 bendamustine/Rituxan 05/31/2014.  Cycle 2 bendamustine/Rituxan 07/02/2014  Cycle 3 bendamustine/rituximab 07/30/2014  Cycle 4 bendamustine/rituximab 09/10/2014  Cycle 5 bendamustine/rituximab 10/22/2014  Cycle 6 bendamustine/rituximab 12/10/2014 2. Left knee arthritis. Followed by Dr. Wynelle Link. 3. Sleep apnea. 4. Anemia. Now  microcytic with peripheral blood smear findings consistent with iron deficiency 07/09/2015, serum iron studies consistent with "anemia of chronic disease ", stool Hemoccult cards negative 07/11/2015 5. Question left parotitis 12/12/2013 presenting with left facial and parotid edema. 6. Admission with pneumonia February 2016 7. History of Neutropenia secondary to chemotherapy, now with moderate neutropenia 8. Left lung pneumonia 04/02/2015, sputum culture positive for Moraxella 06/19/2015 9. Low IgG and IgA 10. CT chest 05/13/2015 with bilateral lower lung consolidation 11. CT chest, abdomen, and pelvis 01/08/2016-new peribronchial thickening and peripheral tree in bud pattern at the right lower lobe, no lymphadenopathy 12. Sinus mucosal thickening/opacification on CT 08/16/2015-status post sinus surgery 12/05/2015 13. Sputum culture positive for pseudomonas aeruginosa on 01/10/2016-treated with 10 days of ciprofloxacin   Disposition:  Natasha Ellis appears much improved while on ciprofloxacin. I suspect the increased cough and malaise are related to pseudomonas pneumonia.  She continues close follow-up with pulmonary medicine.  She has stable anemia secondary to Waldenstrm's macroglobulinemia and chronic disease. There is no indication for treating the Waldenstrm's at present. We will consider treatment with Ibrutinib if the anemia progresses or the [name] rises significantly.  Natasha Ellis will return for an office visit in 2 months.  [name], MD  01/24/2016  10:53 AM

## 2016-01-24 NOTE — Telephone Encounter (Signed)
Gave pt cal & avs °

## 2016-01-25 LAB — IGM: IgM, Qn, Serum: 1784 mg/dL — ABNORMAL HIGH (ref 26–217)

## 2016-01-28 DIAGNOSIS — J324 Chronic pansinusitis: Secondary | ICD-10-CM | POA: Diagnosis not present

## 2016-01-28 LAB — GRAM STAIN

## 2016-01-28 LAB — PROTEIN ELECTROPHORESIS, SERUM
A/G Ratio: 1.1 (ref 0.7–1.7)
Albumin: 3.1 g/dL (ref 2.9–4.4)
Alpha 1: 0.3 g/dL (ref 0.0–0.4)
Alpha 2: 0.7 g/dL (ref 0.4–1.0)
Beta: 0.7 g/dL (ref 0.7–1.3)
Gamma Globulin: 1.2 g/dL (ref 0.4–1.8)
Globulin, Total: 2.9 g/dL (ref 2.2–3.9)
M-Spike, %: 1.1 g/dL — ABNORMAL HIGH
Total Protein: 6 g/dL (ref 6.0–8.5)

## 2016-02-02 NOTE — Assessment & Plan Note (Signed)
Psuedomonas Bronchitis /PNA  Clinically improving with Cipro  Will have close follow up with cxr in 2-3 weeks with Dr. Barrington Ellison cipro course.   Plan  Finish Cipro as directed.  Mucinex Twice daily  As needed  Cough/congestion .  Eat yogurt daily .  Follow up with Dr. Elsworth Soho  In 3  weeks with chest xray  Please contact office for sooner follow up if symptoms do not improve or worsen or seek emergency care

## 2016-02-02 NOTE — Progress Notes (Signed)
   Subjective:    Patient ID: [name], female    DOB: 1931-01-25, 82 y.o.   MRN: [id number]  HPI 82  year old female. With hx of recurrent pneumonia, chronic cough   - referred by Dr.Sherill Has  OSA -compliant with CPAP 8 cm CHronic cough x 11 years.   - Low grage B cell diagnosed 2004. S/p chemo 2011 at [place]. 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 Treated with multiple systemic therapies, Last 12/2014    She has a 25 pack-year smoking history -Quit in 1981 Was using only nocutrnal oxygen--this was discontinued in sept 2013 when she moved from [place] to [place]. She was seeing [facility] in [place].   01/21/16 Follow up :  Chief Complaint  Patient presents with  . Follow-up    RA pt here for a 1 month follow up. Pt c/o SOB with activity and a dry cough. Denies any chest congestion/tightness, sinus pressure/drainage, fever, nasuea or vomiting.  was recently seen by Oncology with sputum culture showing Pseudomonas -pansensitive .  tx w/ Cipro , she has few days left. Is starting to to feel better with less congestion .  CXR on 6/13 showed increased L Basilar opacity and decreased R basilar opacity .  She denies chest pain, orthopnea or hemoptysis .  She had recent fall seen in ER with CT head neg for acute process.  CT chest /abd 6/7 showed new tree in bud patter in RLL , stable RUL nodule nad no evidence of lymphoma in abd or pelvis.          Significant tests/ events Sputum cx on 11/16 -positive for Moraxella Cat. She was tx w/ 14 days of Levaquin.   CT sinus 08/2015  extensive sinusitis on CT - took clinda x 1 month - persisted - Underwent sinus surgery 12/04/2015    04/2013 head CT - Sinus disease with mucosal thickening involving all sinus cavities. echo in 2014 with nml EF , GR 1 DD   08/2015 CT sinus >> Extensive multifocal paranasal sinus disease, most marked in the ethmoid air cell complexes and in  the left maxillary antrum  05/2015 CT chest  showed bilateral lower lobe consolidation  10/2015 ONO shows minimal desaturations; does not need O2.  Review of Systems  neg for any significant sore throat, dysphagia, itching, sneezing, nasal congestion or excess/ purulent secretions, fever, chills, sweats, unintended wt loss, pleuritic or exertional cp, hempoptysis, orthopnea pnd or change in chronic leg swelling. Also denies presyncope, palpitations, heartburn, abdominal pain, nausea, vomiting, diarrhea or change in bowel or urinary habits, dysuria,hematuria, rash, arthralgias, visual complaints, headache, numbness weakness or ataxia.     Objective:   Physical Exam  Filed Vitals:   01/21/16 1104  BP: 124/82  Pulse: 75  Height: 5\' 6"  (1.676 m)  Weight: 250 lb (113.399 kg)  SpO2: 94%    Gen. Pleasant, well-nourished, in no distress, elderly  ENT - no lesions, no post nasal drip Neck: No JVD, no thyromegaly, no carotid bruits Lungs: no use of accessory muscles, no dullness to percussion, coarse BS without rales or rhonchi  Cardiovascular: Rhythm regular, heart sounds  normal, no murmurs or gallops, no peripheral edema Musculoskeletal: No deformities, no cyanosis or clubbing   Natasha Ellis, ANP-C  [facility]  01/21/16

## 2016-02-11 ENCOUNTER — Ambulatory Visit: Payer: Medicare Other | Admitting: Adult Health

## 2016-02-14 ENCOUNTER — Ambulatory Visit (INDEPENDENT_AMBULATORY_CARE_PROVIDER_SITE_OTHER)
Admission: RE | Admit: 2016-02-14 | Discharge: 2016-02-14 | Disposition: A | Discharge status: Home / Self Care | Payer: Medicare Other | Source: Ambulatory Visit | Attending: Adult Health | Admitting: Adult Health

## 2016-02-14 ENCOUNTER — Ambulatory Visit (INDEPENDENT_AMBULATORY_CARE_PROVIDER_SITE_OTHER): Payer: Medicare Other | Admitting: Adult Health

## 2016-02-14 ENCOUNTER — Encounter: Payer: Self-pay | Admitting: Adult Health

## 2016-02-14 VITALS — BP 144/70 | HR 73 | Temp 98.0°F | Wt 250.0 lb

## 2016-02-14 DIAGNOSIS — R05 Cough: Secondary | ICD-10-CM | POA: Diagnosis not present

## 2016-02-14 DIAGNOSIS — J189 Pneumonia, unspecified organism: Secondary | ICD-10-CM

## 2016-02-14 MED ORDER — ALBUTEROL SULFATE HFA 108 (90 BASE) MCG/ACT IN AERS: 2.0000 | INHALATION_SPRAY | Freq: Four times a day (QID) | RESPIRATORY_TRACT | Status: DC | PRN

## 2016-02-14 NOTE — Progress Notes (Signed)
   Subjective:    Patient ID: Natasha Ellis, female    DOB: 1931-05-02, 82 y.o.   MRN: [id number]  HPI74  year old female. With hx of recurrent pneumonia, chronic cough   - referred by Dr.Sherill Has  OSA -compliant with CPAP 8 cm CHronic cough x 11 years.   - Low grage B cell diagnosed 2004. S/p chemo 2011 at [place]. 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 Treated with multiple systemic therapies, Last 12/2014    She has a 25 pack-year smoking history -Quit in 1981 Was using only nocutrnal oxygen--this was discontinued in sept 2013 when she moved from [place] to [place]. She was seeing [facility] in [place].  02/14/2016  Follow up :  Recurrent PNA , chronic cough  Patient returns for a one-month follow-up. was recently seen by Oncology with sputum culture showing Pseudomonas -pansensitive .  tx w/ Cipro ,CXR on 6/13 showed increased L Basilar opacity and decreased R basilar opacity .  She has now finished all of her antibiotics. She is feeling improved with decreased cough and congestion. She denies any fever, chest pain, orthopnea, PND, or increased leg swelling.. Chest x-ray today shows decreased left lower lobe. Opacity.          Significant tests/ events Sputum cx on 11/16 -positive for Moraxella Cat. She was tx w/ 14 days of Levaquin.   CT sinus 08/2015  extensive sinusitis on CT - took clinda x 1 month - persisted - Underwent sinus surgery 12/04/2015    04/2013 head CT - Sinus disease with mucosal thickening involving all sinus cavities. echo in 2014 with nml EF , GR 1 DD   08/2015 CT sinus >> Extensive multifocal paranasal sinus disease, most marked in the ethmoid air cell complexes and in the left maxillary antrum  05/2015 CT chest  showed bilateral lower lobe consolidation  10/2015 ONO shows minimal desaturations; does not need O2.  Review of Systems neg for any significant sore throat, dysphagia, itching,  sneezing, nasal congestion or excess/ purulent secretions, fever, chills, sweats, unintended wt loss, pleuritic or exertional cp, hempoptysis, orthopnea pnd or change in chronic leg swelling. Also denies presyncope, palpitations, heartburn, abdominal pain, nausea, vomiting, diarrhea or change in bowel or urinary habits, dysuria,hematuria, rash, arthralgias, visual complaints, headache, numbness weakness or ataxia.     Objective:   Physical Exam Filed Vitals:   02/14/16 1228 02/14/16 1229  BP: 144/70   Pulse: 73   Temp:  98 F (36.7 C)  TempSrc:  Oral  Weight:  250 lb (113.399 kg)  SpO2: 96%     Gen. Pleasant, well-nourished, in no distress, elderly  ENT - no lesions, no post nasal drip Neck: No JVD, no thyromegaly, no carotid bruits Lungs: no use of accessory muscles, no dullness to percussion, coarse BS without rales or rhonchi  Cardiovascular: Rhythm regular, heart sounds  normal, no murmurs or gallops, no peripheral edema Musculoskeletal: No deformities, no cyanosis or clubbing  . chest X-ray 02/14/16 reviewed independently IMPRESSION: 1. Left lung base opacity noted on the prior chest radiograph has improved. 2. There is lung base bronchial wall thickening and coarse reticular opacities consistent with combination scarring chronic bronchitic change and mild bronchiectasis. Some residual bronchopneumonia is possible.  Natasha Arista NP-C  [facility]  02/14/2016

## 2016-02-14 NOTE — Assessment & Plan Note (Signed)
Recent flare , pt is clinically improved cXR is clearing   Plan  Mucinex Twice daily  As needed  Cough/congestion .  Follow up with Dr. Elsworth Soho  In 2- 3 months with chest xray and As needed   Please contact office for sooner follow up if symptoms do not improve or worsen or seek emergency care

## 2016-02-14 NOTE — Patient Instructions (Addendum)
Mucinex Twice daily  As needed  Cough/congestion .  Follow up with Dr. Elsworth Soho  In 2- 3 months with chest xray and As needed   Please contact office for sooner follow up if symptoms do not improve or worsen or seek emergency care

## 2016-03-10 ENCOUNTER — Telehealth: Payer: Self-pay | Admitting: Pulmonary Disease

## 2016-03-10 MED ORDER — DOXYCYCLINE HYCLATE 100 MG PO TABS: 100.0000 mg | ORAL_TABLET | Freq: Every day | ORAL | 0 refills | Status: DC

## 2016-03-10 NOTE — Telephone Encounter (Signed)
Patient notified of Dr. Alva's recommendations. Rx sent to pharmacy. Nothing further needed.  

## 2016-03-10 NOTE — Telephone Encounter (Signed)
Doxycycline 100 daily for 7 days Mucinex 600 twice a day

## 2016-03-10 NOTE — Telephone Encounter (Signed)
Pt c/o fever(99-100), HA and sore throat since Saturday 03/07/16 - pt states that she also has common cold symptoms (runny nose, congestion, cough, mucus). Pt states that she is having a lot of mucus production. Requesting something be called in to the [facility].  Please advise Dr Elsworth Soho. Thanks.

## 2016-03-12 ENCOUNTER — Telehealth: Payer: Self-pay | Admitting: Pulmonary Disease

## 2016-03-12 MED ORDER — AZITHROMYCIN 250 MG PO TABS: ORAL_TABLET | ORAL | 0 refills | Status: DC

## 2016-03-12 NOTE — Telephone Encounter (Signed)
Called and spoke to pt. Questioned if pt has tolerated and responded to Zpak in the past, pt states she has and it worked well for her. Rx sent to preferred pharmacy. Pt verbalized understanding and denied any further questions or concerns at this time.    Will send to [facility] as FYI.

## 2016-03-12 NOTE — Telephone Encounter (Signed)
Can she tlerate a Z pak?

## 2016-03-12 NOTE — Telephone Encounter (Signed)
Spoke with pt. She was given Doxy 100mg  daily on 03/10/2016 by RA. This is causing diarrhea. I have advised her to not take anymore of this medication until she hears back from [place]. Doxy has been added to her allergy list.  Allergies  Allergen Reactions  . Codeine Other (See Comments)    Crazy-nightmares  . Amoxicillin Diarrhea  . Cefdinir Cough  . Doxycycline Diarrhea  . Erythromycin Other (See Comments)    GI upset  . Lisinopril Cough  . Lisinopril     unknown  . Oxybutynin   . Sulfa Antibiotics Other (See Comments)    GI upset   Current Outpatient Prescriptions on File Prior to Visit  Medication Sig Dispense Refill  . acetaminophen (TYLENOL) 500 MG tablet Take 500 mg by mouth every 6 (six) hours as needed for pain.    Marland Kitchen albuterol (PROAIR HFA) 108 (90 Base) MCG/ACT inhaler Inhale 2 puffs into the lungs every 6 (six) hours as needed for wheezing or shortness of breath. 1 Inhaler 3  . albuterol (PROVENTIL) (2.5 MG/3ML) 0.083% nebulizer solution Take 3 mLs (2.5 mg total) by nebulization every 4 (four) hours as needed for wheezing or shortness of breath. 75 mL 1  . alendronate (FOSAMAX) 70 MG tablet Take 70 mg by mouth once a week. Sundays    . b complex vitamins capsule Take 1 capsule by mouth daily.    . calcium-vitamin D (OSCAL WITH D) 500-200 MG-UNIT tablet Take 1 tablet by mouth daily with breakfast.    . cholestyramine (QUESTRAN) 4 GM/DOSE powder Take 4 g by mouth daily as needed. Reported on 01/24/2016    . Diphenhydramine-PE-APAP (DELSYM COUGH/COLD NIGHT TIME PO) Take by mouth at bedtime as needed.    . doxycycline (VIBRA-TABS) 100 MG tablet Take 1 tablet (100 mg total) by mouth daily. 7 tablet 0  . ferrous sulfate 325 (65 FE) MG tablet Take 325 mg by mouth daily.     . furosemide (LASIX) 20 MG tablet Take 1 tablet (20 mg total) by mouth daily. 30 tablet 3  . guaifenesin (ROBITUSSIN) 100 MG/5ML syrup Take 10 mLs (200 mg total) by mouth every 4 (four) hours as needed for  congestion. 120 mL 1  . ibuprofen (ADVIL,MOTRIN) 200 MG tablet Take 200 mg by mouth every 6 (six) hours as needed for pain.    Marland Kitchen loperamide (IMODIUM) 2 MG capsule Take 1 capsule (2 mg total) by mouth 4 (four) times daily as needed for diarrhea or loose stools.    . Multiple Vitamin (MULTIVITAMIN) capsule Take 1 capsule by mouth daily. With Vitamin D 4000    . potassium chloride (K-DUR) 10 MEQ tablet Take 10 mEq by mouth daily.    . promethazine (PHENERGAN) 25 MG suppository Place 1 suppository (25 mg total) rectally every 6 (six) hours as needed for nausea or vomiting. 12 suppository 1   No current facility-administered medications on file prior to visit.      [facility] - please advise as RA worked night shift last night. Thanks.

## 2016-03-19 ENCOUNTER — Encounter: Payer: Self-pay | Admitting: Neurology

## 2016-03-19 ENCOUNTER — Ambulatory Visit (INDEPENDENT_AMBULATORY_CARE_PROVIDER_SITE_OTHER): Payer: Medicare Other | Admitting: Neurology

## 2016-03-19 VITALS — BP 110/80 | HR 79 | Ht 66.0 in | Wt 253.1 lb

## 2016-03-19 DIAGNOSIS — R202 Paresthesia of skin: Secondary | ICD-10-CM

## 2016-03-19 DIAGNOSIS — I6389 Other cerebral infarction: Secondary | ICD-10-CM

## 2016-03-19 DIAGNOSIS — M6289 Other specified disorders of muscle: Secondary | ICD-10-CM

## 2016-03-19 DIAGNOSIS — I638 Other cerebral infarction: Secondary | ICD-10-CM | POA: Diagnosis not present

## 2016-03-19 DIAGNOSIS — R29898 Other symptoms and signs involving the musculoskeletal system: Secondary | ICD-10-CM

## 2016-03-19 NOTE — Patient Instructions (Addendum)
1.  MRI brain will be ordered to look for stroke 2.  Imaging of your blood vessels in the brain and neck 3.  Start occupational therapy for hand strengthening 4.  Fasting lipid profile 5.  Continue plavix 75mg  daily  We will call you with the results  Return to clinic in 3 months
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
## 2016-03-20 NOTE — Progress Notes (Signed)
Note routed

## 2016-03-24 ENCOUNTER — Other Ambulatory Visit (HOSPITAL_BASED_OUTPATIENT_CLINIC_OR_DEPARTMENT_OTHER): Payer: Medicare Other

## 2016-03-24 ENCOUNTER — Ambulatory Visit (HOSPITAL_BASED_OUTPATIENT_CLINIC_OR_DEPARTMENT_OTHER): Payer: Medicare Other | Admitting: Oncology

## 2016-03-24 ENCOUNTER — Other Ambulatory Visit: Payer: Self-pay | Admitting: Neurology

## 2016-03-24 ENCOUNTER — Telehealth: Payer: Self-pay | Admitting: Oncology

## 2016-03-24 VITALS — BP 130/75 | HR 68 | Temp 97.8°F | Resp 18 | Ht 66.0 in | Wt 257.1 lb

## 2016-03-24 DIAGNOSIS — M6289 Other specified disorders of muscle: Secondary | ICD-10-CM | POA: Diagnosis not present

## 2016-03-24 DIAGNOSIS — R202 Paresthesia of skin: Secondary | ICD-10-CM | POA: Diagnosis not present

## 2016-03-24 DIAGNOSIS — I638 Other cerebral infarction: Secondary | ICD-10-CM | POA: Diagnosis not present

## 2016-03-24 DIAGNOSIS — D649 Anemia, unspecified: Secondary | ICD-10-CM | POA: Diagnosis not present

## 2016-03-24 DIAGNOSIS — R531 Weakness: Secondary | ICD-10-CM

## 2016-03-24 DIAGNOSIS — C880 Waldenstrom macroglobulinemia: Secondary | ICD-10-CM

## 2016-03-24 LAB — COMPREHENSIVE METABOLIC PANEL
ALT: 9 U/L (ref 0–55)
AST: 14 U/L (ref 5–34)
Albumin: 3.1 g/dL — ABNORMAL LOW (ref 3.5–5.0)
Alkaline Phosphatase: 83 U/L (ref 40–150)
Anion Gap: 8 mEq/L (ref 3–11)
BUN: 10.8 mg/dL (ref 7.0–26.0)
CO2: 26 mEq/L (ref 22–29)
Calcium: 9.2 mg/dL (ref 8.4–10.4)
Chloride: 108 mEq/L (ref 98–109)
Creatinine: 0.8 mg/dL (ref 0.6–1.1)
EGFR: 65 mL/min/{1.73_m2} — ABNORMAL LOW (ref >90)
Glucose: 88 mg/dl (ref 70–140)
Potassium: 4.2 mEq/L (ref 3.5–5.1)
Sodium: 142 mEq/L (ref 136–145)
Total Bilirubin: 0.8 mg/dL (ref 0.20–1.20)
Total Protein: 6.7 g/dL (ref 6.4–8.3)

## 2016-03-24 LAB — CBC WITH DIFFERENTIAL/PLATELET
BASO%: 0.5 % (ref 0.0–2.0)
Basophils Absolute: 0 10*3/uL (ref 0.0–0.1)
EOS%: 2.8 % (ref 0.0–7.0)
Eosinophils Absolute: 0.2 10*3/uL (ref 0.0–0.5)
HCT: 31.9 % — ABNORMAL LOW (ref 34.8–46.6)
HGB: 10.1 g/dL — ABNORMAL LOW (ref 11.6–15.9)
LYMPH%: 22.1 % (ref 14.0–49.7)
MCH: 26.9 pg (ref 25.1–34.0)
MCHC: 31.7 g/dL (ref 31.5–36.0)
MCV: 84.8 fL (ref 79.5–101.0)
MONO#: 0.3 10*3/uL (ref 0.1–0.9)
MONO%: 5.5 % (ref 0.0–14.0)
NEUT#: 3.9 10*3/uL (ref 1.5–6.5)
NEUT%: 69.1 % (ref 38.4–76.8)
Platelets: 204 10*3/uL (ref 145–400)
RBC: 3.76 10*6/uL (ref 3.70–5.45)
RDW: 16.9 % — ABNORMAL HIGH (ref 11.2–14.5)
WBC: 5.6 10*3/uL (ref 3.9–10.3)
lymph#: 1.2 10*3/uL (ref 0.9–3.3)
nRBC: 0 % (ref 0–0)

## 2016-03-24 NOTE — Telephone Encounter (Signed)
Gave patient avs report and appointments for November.  °

## 2016-03-24 NOTE — Progress Notes (Signed)
  [facility] OFFICE PROGRESS NOTE   Diagnosis: Waldenstrm's macroglobulinemia  INTERVAL HISTORY:   Natasha Ellis returns as scheduled. She continues to have a cough, but overall feels better after treatment for pneumonia. She is seeing Dr. Posey Pronto for evaluation of weakness in the left hand. She is being scheduled for an MRI.   Objective:  Vital signs in last 24 hours:  Blood pressure 130/75, pulse 68, temperature 97.8 F (36.6 C), temperature source Oral, resp. rate 18, height 5\' 6"  (1.676 m), weight 257 lb 1.6 oz (116.6 kg), SpO2 97 %.    HEENT: Neck without mass Lymphatics: No cervical or supraclavicular nodes Resp: Scattered rhonchi, most prominent at the lower chest bilaterally, no respiratory distress  Cardio:  Regular rate and rhythm GI:  No hepatosplenomegaly Vascular:  No leg edema   Lab Results:  Lab Results  Component Value Date   WBC 5.6 03/24/2016   HGB 10.1 (L) 03/24/2016   HCT 31.9 (L) 03/24/2016   MCV 84.8 03/24/2016   PLT 204 03/24/2016   NEUTROABS 3.9 03/24/2016     [name] on 01/24/2016-1784  Medications: I have reviewed the patient's current medications.  Assessment/Plan: 1. 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 Treated with multiple systemic therapies includingpentostatin/Cytoxan/rituximab in 2010.   Cycle 1 bendamustine/Rituxan 05/31/2014.  Cycle 2 bendamustine/Rituxan 07/02/2014  Cycle 3 bendamustine/rituximab 07/30/2014  Cycle 4 bendamustine/rituximab 09/10/2014  Cycle 5 bendamustine/rituximab 10/22/2014  Cycle 6 bendamustine/rituximab 12/10/2014 2. Left knee arthritis. Followed by Dr. Wynelle Link. 3. Sleep apnea. 4. Anemia. Now microcytic with peripheral blood smear findings consistent with iron deficiency 07/09/2015, serum iron studies consistent  with "anemia of chronic disease ", stool Hemoccult cards negative 07/11/2015 5. Question left parotitis 12/12/2013 presenting with left facial and parotid edema. 6. Admission with pneumonia February 2016 7. History of Neutropenia secondary to chemotherapy, now with moderate neutropenia 8. Left lung pneumonia 04/02/2015, sputum culture positive for Moraxella 06/19/2015 9. Low IgG and IgA 10. CT chest 05/13/2015 with bilateral lower lung consolidation 11. CT chest, abdomen, and pelvis 01/08/2016-new peribronchial thickening and peripheral tree in bud pattern at the right lower lobe, no lymphadenopathy 12. Sinus mucosal thickening/opacification on CT 08/16/2015-status post sinus surgery 12/05/2015 13. Sputum culture positive for pseudomonas aeruginosa on 01/10/2016-treated with 10 days of ciprofloxacin    Disposition:  She appears stable. No clinical evidence for progression of the Waldenstrm's macroglobulinemia. The hemoglobin is improved compared to when she was here in June. We will continue to follow the lymphoma with observation.  Ms. Kraning will return for an office visit in 2 months.  [name], MD  03/24/2016  10:51 AM

## 2016-03-25 DIAGNOSIS — M6281 Muscle weakness (generalized): Secondary | ICD-10-CM | POA: Diagnosis not present

## 2016-03-25 DIAGNOSIS — G459 Transient cerebral ischemic attack, unspecified: Secondary | ICD-10-CM | POA: Diagnosis not present

## 2016-03-25 DIAGNOSIS — R278 Other lack of coordination: Secondary | ICD-10-CM | POA: Diagnosis not present

## 2016-03-25 LAB — IGM: IgM, Qn, Serum: 2017 mg/dL — ABNORMAL HIGH (ref 26–217)

## 2016-03-26 LAB — LIPID PANEL W/O CHOL/HDL RATIO
Cholesterol, Total: 149 mg/dL (ref 100–199)
HDL: 52 mg/dL (ref >39)
LDL Calculated: 82 mg/dL (ref 0–99)
Triglycerides: 76 mg/dL (ref 0–149)
VLDL Cholesterol Cal: 15 mg/dL (ref 5–40)

## 2016-03-28 DIAGNOSIS — N3000 Acute cystitis without hematuria: Secondary | ICD-10-CM | POA: Diagnosis not present

## 2016-03-31 ENCOUNTER — Ambulatory Visit (HOSPITAL_COMMUNITY)
Admission: RE | Admit: 2016-03-31 | Discharge: 2016-03-31 | Disposition: A | Discharge status: Home / Self Care | Payer: Medicare Other | Source: Ambulatory Visit | Attending: Neurology | Admitting: Neurology

## 2016-03-31 DIAGNOSIS — E785 Hyperlipidemia, unspecified: Secondary | ICD-10-CM | POA: Diagnosis not present

## 2016-03-31 DIAGNOSIS — R202 Paresthesia of skin: Secondary | ICD-10-CM

## 2016-03-31 DIAGNOSIS — I638 Other cerebral infarction: Secondary | ICD-10-CM | POA: Diagnosis not present

## 2016-03-31 DIAGNOSIS — I6523 Occlusion and stenosis of bilateral carotid arteries: Secondary | ICD-10-CM | POA: Diagnosis not present

## 2016-03-31 DIAGNOSIS — Z87891 Personal history of nicotine dependence: Secondary | ICD-10-CM | POA: Diagnosis not present

## 2016-03-31 DIAGNOSIS — I10 Essential (primary) hypertension: Secondary | ICD-10-CM | POA: Insufficient documentation

## 2016-03-31 DIAGNOSIS — J449 Chronic obstructive pulmonary disease, unspecified: Secondary | ICD-10-CM | POA: Insufficient documentation

## 2016-03-31 DIAGNOSIS — M6289 Other specified disorders of muscle: Secondary | ICD-10-CM | POA: Diagnosis not present

## 2016-03-31 DIAGNOSIS — R278 Other lack of coordination: Secondary | ICD-10-CM | POA: Diagnosis not present

## 2016-03-31 DIAGNOSIS — I6389 Other cerebral infarction: Secondary | ICD-10-CM

## 2016-03-31 DIAGNOSIS — R29898 Other symptoms and signs involving the musculoskeletal system: Secondary | ICD-10-CM

## 2016-03-31 DIAGNOSIS — M6281 Muscle weakness (generalized): Secondary | ICD-10-CM | POA: Diagnosis not present

## 2016-03-31 DIAGNOSIS — G459 Transient cerebral ischemic attack, unspecified: Secondary | ICD-10-CM | POA: Diagnosis not present

## 2016-04-03 ENCOUNTER — Ambulatory Visit
Admission: RE | Admit: 2016-04-03 | Discharge: 2016-04-03 | Disposition: A | Discharge status: Home / Self Care | Payer: Medicare Other | Source: Ambulatory Visit | Attending: Neurology | Admitting: Neurology

## 2016-04-03 ENCOUNTER — Telehealth: Payer: Self-pay | Admitting: Neurology

## 2016-04-03 DIAGNOSIS — I6389 Other cerebral infarction: Secondary | ICD-10-CM

## 2016-04-03 DIAGNOSIS — R202 Paresthesia of skin: Secondary | ICD-10-CM

## 2016-04-03 DIAGNOSIS — G459 Transient cerebral ischemic attack, unspecified: Secondary | ICD-10-CM | POA: Diagnosis not present

## 2016-04-03 DIAGNOSIS — M6281 Muscle weakness (generalized): Secondary | ICD-10-CM | POA: Diagnosis not present

## 2016-04-03 DIAGNOSIS — R29898 Other symptoms and signs involving the musculoskeletal system: Secondary | ICD-10-CM

## 2016-04-03 DIAGNOSIS — R278 Other lack of coordination: Secondary | ICD-10-CM | POA: Diagnosis not present

## 2016-04-03 DIAGNOSIS — I639 Cerebral infarction, unspecified: Secondary | ICD-10-CM | POA: Diagnosis not present

## 2016-04-03 NOTE — Telephone Encounter (Signed)
Called and discussed results of MRI/A brain, US carotids, and lipid panel with patient.  Imaging shows subacute right centrum semiovale infarct with would explain left hand weakness.  There is on evidence of R carotid stenosis or proximal R MCA disease.    She needs additional stroke work-up to look for embolic source with TTE and 30-day cardiac monitor.  She will continue plavix 75mg  daily.  Stroke warning signs discussed.  Results: US carotids 123456:  RICA 123456, LICA 123456 stenosis   MRI/A brain 04/03/2016: Acute or subacute punctate deep white matter infarcts, RIGHT centrum semiovale.  Widespread areas of chronic ischemia in the RIGHT hemisphere, RIGHT MCA territory.  Atrophy and small vessel disease.  No proximal large vessel occlusion or flow-limiting stenosis.  Intracranial atherosclerotic change affects both distal MCA segments.  Findings discussed with ordering provider.  Natasha K. Posey Pronto, DO

## 2016-04-07 ENCOUNTER — Other Ambulatory Visit: Payer: Self-pay | Admitting: *Deleted

## 2016-04-07 ENCOUNTER — Other Ambulatory Visit: Payer: Self-pay | Admitting: Physician Assistant

## 2016-04-07 ENCOUNTER — Telehealth: Payer: Self-pay | Admitting: Physician Assistant

## 2016-04-07 DIAGNOSIS — I10 Essential (primary) hypertension: Secondary | ICD-10-CM

## 2016-04-07 DIAGNOSIS — K219 Gastro-esophageal reflux disease without esophagitis: Secondary | ICD-10-CM

## 2016-04-07 DIAGNOSIS — R278 Other lack of coordination: Secondary | ICD-10-CM | POA: Diagnosis not present

## 2016-04-07 DIAGNOSIS — G459 Transient cerebral ischemic attack, unspecified: Secondary | ICD-10-CM

## 2016-04-07 DIAGNOSIS — M6281 Muscle weakness (generalized): Secondary | ICD-10-CM | POA: Diagnosis not present

## 2016-04-07 DIAGNOSIS — R112 Nausea with vomiting, unspecified: Secondary | ICD-10-CM

## 2016-04-07 NOTE — Telephone Encounter (Signed)
Order placed for TEE and ENDO called [phone].  They will contact patient to schedule.  Order also placed for 30 day cardiac event monitor.

## 2016-04-07 NOTE — Telephone Encounter (Signed)
    Patient scheduled for TEE for recent CVA ( although office called me and told me it was a TIA) so that is what it is scheduled under. She will get TEE with Dr. Stanford Breed on 04/14/16 at 11 am   Angelena Form PA-C  [facility]

## 2016-04-07 NOTE — Progress Notes (Signed)
     I was called by neuro office to schedule a TEE for TIA. I personally scheduled the TEE on 04/14/16 at 11 am with Dr. Stanford Breed. I discussed with patient and provided instructions.  Angelena Form PA-C  [facility]

## 2016-04-10 DIAGNOSIS — R278 Other lack of coordination: Secondary | ICD-10-CM | POA: Diagnosis not present

## 2016-04-10 DIAGNOSIS — G459 Transient cerebral ischemic attack, unspecified: Secondary | ICD-10-CM | POA: Diagnosis not present

## 2016-04-10 DIAGNOSIS — M6281 Muscle weakness (generalized): Secondary | ICD-10-CM | POA: Diagnosis not present

## 2016-04-14 ENCOUNTER — Ambulatory Visit (HOSPITAL_BASED_OUTPATIENT_CLINIC_OR_DEPARTMENT_OTHER): Payer: Medicare Other

## 2016-04-14 ENCOUNTER — Ambulatory Visit (HOSPITAL_COMMUNITY)
Admission: RE | Admit: 2016-04-14 | Discharge: 2016-04-14 | Disposition: A | Discharge status: Home / Self Care | Payer: Medicare Other | Source: Ambulatory Visit | Attending: Internal Medicine | Admitting: Internal Medicine

## 2016-04-14 ENCOUNTER — Encounter (HOSPITAL_COMMUNITY)
Admission: RE | Disposition: A | Discharge status: Home / Self Care | Payer: Self-pay | Source: Ambulatory Visit | Attending: Internal Medicine

## 2016-04-14 ENCOUNTER — Encounter (HOSPITAL_COMMUNITY): Payer: Self-pay

## 2016-04-14 DIAGNOSIS — Z8673 Personal history of transient ischemic attack (TIA), and cerebral infarction without residual deficits: Secondary | ICD-10-CM | POA: Insufficient documentation

## 2016-04-14 DIAGNOSIS — Z79899 Other long term (current) drug therapy: Secondary | ICD-10-CM | POA: Insufficient documentation

## 2016-04-14 DIAGNOSIS — I10 Essential (primary) hypertension: Secondary | ICD-10-CM | POA: Insufficient documentation

## 2016-04-14 DIAGNOSIS — Z7983 Long term (current) use of bisphosphonates: Secondary | ICD-10-CM | POA: Diagnosis not present

## 2016-04-14 DIAGNOSIS — Z7902 Long term (current) use of antithrombotics/antiplatelets: Secondary | ICD-10-CM | POA: Diagnosis not present

## 2016-04-14 DIAGNOSIS — I340 Nonrheumatic mitral (valve) insufficiency: Secondary | ICD-10-CM

## 2016-04-14 DIAGNOSIS — I081 Rheumatic disorders of both mitral and tricuspid valves: Secondary | ICD-10-CM | POA: Insufficient documentation

## 2016-04-14 DIAGNOSIS — Z09 Encounter for follow-up examination after completed treatment for conditions other than malignant neoplasm: Secondary | ICD-10-CM | POA: Insufficient documentation

## 2016-04-14 HISTORY — PX: TEE WITHOUT CARDIOVERSION: SHX5443

## 2016-04-14 SURGERY — ECHOCARDIOGRAM, TRANSESOPHAGEAL
Anesthesia: Moderate Sedation

## 2016-04-14 MED ORDER — FENTANYL CITRATE (PF) 100 MCG/2ML IJ SOLN
INTRAMUSCULAR | Status: DC | PRN
  Administered 2016-04-14 (×3): 25 ug via INTRAVENOUS

## 2016-04-14 MED ORDER — MIDAZOLAM HCL 5 MG/ML IJ SOLN
INTRAMUSCULAR | Status: AC
  Filled 2016-04-14: qty 2

## 2016-04-14 MED ORDER — MIDAZOLAM HCL 10 MG/2ML IJ SOLN
INTRAMUSCULAR | Status: DC | PRN
  Administered 2016-04-14 (×2): 2 mg via INTRAVENOUS
  Administered 2016-04-14: 1 mg via INTRAVENOUS

## 2016-04-14 MED ORDER — FENTANYL CITRATE (PF) 100 MCG/2ML IJ SOLN
INTRAMUSCULAR | Status: AC
  Filled 2016-04-14: qty 2

## 2016-04-14 MED ORDER — BUTAMBEN-TETRACAINE-BENZOCAINE 2-2-14 % EX AERO
INHALATION_SPRAY | CUTANEOUS | Status: DC | PRN
  Administered 2016-04-14: 2 via TOPICAL

## 2016-04-14 MED ORDER — SODIUM CHLORIDE 0.9 % IV SOLN
INTRAVENOUS | Status: DC
  Administered 2016-04-14: 500 mL via INTRAVENOUS

## 2016-04-14 NOTE — CV Procedure (Signed)
See full TEE report in [facility] Pt sedated with versed 5 mg and fentanyl 64micrograms IV. Normal LV function; negative saline microcavitation study. Full report to follow Kirk Ruths, MD

## 2016-04-14 NOTE — Interval H&P Note (Signed)
History and Physical Interval Note:  04/14/2016 11:01 AM  Natasha Ellis  has presented today for surgery, with the diagnosis of TIA  The various methods of treatment have been discussed with the patient and family. After consideration of risks, benefits and other options for treatment, the patient has consented to  Procedure(s): TRANSESOPHAGEAL ECHOCARDIOGRAM (TEE) (N/A) as a surgical intervention .  The patient's history has been reviewed, patient examined, no change in status, stable for surgery.  I have reviewed the patient's chart and labs.  Questions were answered to the patient's satisfaction.     Kirk Ruths

## 2016-04-14 NOTE — H&P (View-Only) (Signed)
     I was called by neuro office to schedule a TEE for TIA. I personally scheduled the TEE on 04/14/16 at 11 am with Dr. Stanford Breed. I discussed with patient and provided instructions.  Angelena Form PA-C  [facility]

## 2016-04-14 NOTE — Discharge Instructions (Signed)
YOU HAD AN CARDIAC PROCEDURE TODAY: Refer to the procedure report and other information in the discharge instructions given to you for any specific questions about what was found during the examination. If this information does not answer your questions, please call [facility] office at [phone] to clarify.  ° °DIET: Your first meal following the procedure should be a light meal and then it is ok to progress to your normal diet. A half-sandwich or bowl of soup is an example of a good first meal. Heavy or fried foods are harder to digest and may make you feel nauseous or bloated. Drink plenty of fluids but you should avoid alcoholic beverages for 24 hours. If you had a esophageal dilation, please see attached instructions for diet.  ° °ACTIVITY: Your care partner should take you home directly after the procedure. You should plan to take it easy, moving slowly for the rest of the day. You can resume normal activity the day after the procedure however YOU SHOULD NOT DRIVE, use power tools, machinery or perform tasks that involve climbing or major physical exertion for 24 hours (because of the sedation medicines used during the test).  ° °SYMPTOMS TO REPORT IMMEDIATELY: °A cardiologist can be reached at any hour. Please call [phone] for any of the following symptoms:  °Vomiting of blood or coffee ground material  °New, significant abdominal pain  °New, significant chest pain or pain under the shoulder blades  °Painful or persistently difficult swallowing  °New shortness of breath  °Black, tarry-looking or red, bloody stools ° °FOLLOW UP:  °Please also call with any specific questions about appointments or follow up tests. ° ° ° °Moderate Conscious Sedation, Adult, Care After °Refer to this sheet in the next few weeks. These instructions provide you with information on caring for yourself after your procedure. Your health care provider may also give you more specific instructions. Your treatment has been  planned according to current medical practices, but problems sometimes occur. Call your health care provider if you have any problems or questions after your procedure. °WHAT TO EXPECT AFTER THE PROCEDURE  °After your procedure: °· You may feel sleepy, clumsy, and have poor balance for several hours. °· Vomiting may occur if you eat too soon after the procedure. °HOME CARE INSTRUCTIONS °· Do not participate in any activities where you could become injured for at least 24 hours. Do not: °¨ Drive. °¨ Swim. °¨ Ride a bicycle. °¨ Operate heavy machinery. °¨ Cook. °¨ Use power tools. °¨ Climb ladders. °¨ Work from a high place. °· Do not make important decisions or sign legal documents until you are improved. °· If you vomit, drink water, juice, or soup when you can drink without vomiting. Make sure you have little or no nausea before eating solid foods. °· Only take over-the-counter or prescription medicines for pain, discomfort, or fever as directed by your health care provider. °· Make sure you and your family fully understand everything about the medicines given to you, including what side effects may occur. °· You should not drink alcohol, take sleeping pills, or take medicines that cause drowsiness for at least 24 hours. °· If you smoke, do not smoke without supervision. °· If you are feeling better, you may resume normal activities 24 hours after you were sedated. °· Keep all appointments with your health care provider. °SEEK MEDICAL CARE IF: °· Your skin is pale or bluish in color. °· You continue to feel nauseous or vomit. °· Your pain is getting   worse and is not helped by medicine. °· You have bleeding or swelling. °· You are still sleepy or feeling clumsy after 24 hours. °SEEK IMMEDIATE MEDICAL CARE IF: °· You develop a rash. °· You have difficulty breathing. °· You develop any type of allergic problem. °· You have a fever. °MAKE SURE YOU: °· Understand these instructions. °· Will watch your condition. °· Will  get help right away if you are not doing well or get worse. °  °This information is not intended to replace advice given to you by your health care provider. Make sure you discuss any questions you have with your health care provider. °  °Document Released: 05/10/2013 Document Revised: 08/10/2014 Document Reviewed: 05/10/2013 °Elsevier Interactive Patient Education ©2016 [employer] ° °

## 2016-04-14 NOTE — Progress Notes (Signed)
  Echocardiogram Echocardiogram Transesophageal has been performed.  Darlina Sicilian M 04/14/2016, 12:01 PM

## 2016-04-15 DIAGNOSIS — G459 Transient cerebral ischemic attack, unspecified: Secondary | ICD-10-CM | POA: Diagnosis not present

## 2016-04-15 DIAGNOSIS — R278 Other lack of coordination: Secondary | ICD-10-CM | POA: Diagnosis not present

## 2016-04-15 DIAGNOSIS — M6281 Muscle weakness (generalized): Secondary | ICD-10-CM | POA: Diagnosis not present

## 2016-04-16 DIAGNOSIS — H906 Mixed conductive and sensorineural hearing loss, bilateral: Secondary | ICD-10-CM | POA: Diagnosis not present

## 2016-04-16 DIAGNOSIS — H9212 Otorrhea, left ear: Secondary | ICD-10-CM | POA: Diagnosis not present

## 2016-04-16 DIAGNOSIS — H72813 Multiple perforations of tympanic membrane, bilateral: Secondary | ICD-10-CM | POA: Diagnosis not present

## 2016-04-17 DIAGNOSIS — M6281 Muscle weakness (generalized): Secondary | ICD-10-CM | POA: Diagnosis not present

## 2016-04-17 DIAGNOSIS — R278 Other lack of coordination: Secondary | ICD-10-CM | POA: Diagnosis not present

## 2016-04-17 DIAGNOSIS — G459 Transient cerebral ischemic attack, unspecified: Secondary | ICD-10-CM | POA: Diagnosis not present

## 2016-04-21 DIAGNOSIS — R278 Other lack of coordination: Secondary | ICD-10-CM | POA: Diagnosis not present

## 2016-04-21 DIAGNOSIS — M6281 Muscle weakness (generalized): Secondary | ICD-10-CM | POA: Diagnosis not present

## 2016-04-21 DIAGNOSIS — G459 Transient cerebral ischemic attack, unspecified: Secondary | ICD-10-CM | POA: Diagnosis not present

## 2016-04-22 ENCOUNTER — Ambulatory Visit (INDEPENDENT_AMBULATORY_CARE_PROVIDER_SITE_OTHER): Payer: Medicare Other | Admitting: Pulmonary Disease

## 2016-04-22 ENCOUNTER — Encounter: Payer: Self-pay | Admitting: Pulmonary Disease

## 2016-04-22 DIAGNOSIS — Z23 Encounter for immunization: Secondary | ICD-10-CM

## 2016-04-22 DIAGNOSIS — J189 Pneumonia, unspecified organism: Secondary | ICD-10-CM

## 2016-04-22 NOTE — Progress Notes (Signed)
   Subjective:    Patient ID: Natasha Ellis, female    DOB: 05-06-1931, 82 y.o.   MRN: [id number]  HPI  82  year old female. With hx of recurrent pneumonia, chronic cough   - referred by Dr.Sherill Has  OSA -compliant with CPAP 8 cm CHronic cough x 11 years.   - Low grage B cell diagnosed 2004. S/p chemo 2011 at [facility]. 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 Treated with multiple systemic therapies, Last 12/2014    She has a 25 pack-year smoking history -Quit in 1981 Was using only nocutrnal oxygen--this was discontinued in sept 2013 when she moved from [place] to [place]. She was seeing [facility] in [place].  Underwent sinus surgery 12/04/2015  [name])   04/22/2016  Chief Complaint  Patient presents with  . Follow-up    Discuss current medications;      93m FU  Accompanied by Daughter  She was given ciprofloxacin for pansensitive pseudomonas in sputum in 01/2016 Chest x-ray 02/2016 reviewed with clearing of infiltrate  She was again given antibiotics and 03/2016 She needs clarification on when to take Mucinex and Delsym She has received multiple antibiotics this year   CPAP download 10/2015 shows excellent compliance , average pressure 11 cm on auto 8-15 cm , residue AHI of 5/hour  leak ++  Significant tests/ events  04/2013 head CT - Sinus disease with mucosal thickening involving all sinus cavities. echo in 2014 with nml EF , GR 1 DD   08/2015 CT sinus >> Extensive multifocal paranasal sinus disease, most marked in the ethmoid air cell complexes and in the left maxillary antrum  05/2015 CT chest  showed bilateral lower lobe consolidation  10/2015 ONO shows minimal desaturations; does not need O2.   Review of Systems Patient denies significant dyspnea,cough, hemoptysis,  chest pain, palpitations, pedal edema, orthopnea, paroxysmal nocturnal dyspnea, lightheadedness, nausea, vomiting, abdominal or   leg pains      Objective:   Physical Exam   Gen. Pleasant, obese, in no distress ENT - no lesions, no post nasal drip Neck: No JVD, no thyromegaly, no carotid bruits Lungs: no use of accessory muscles, no dullness to percussion, Left base rales, no rhonchi  Cardiovascular: Rhythm regular, heart sounds  normal, no murmurs or gallops, no peripheral edema Musculoskeletal: No deformities, no cyanosis or clubbing , no tremors      Assessment & Plan:

## 2016-04-22 NOTE — Assessment & Plan Note (Signed)
Clarification- Use Mucinex as expectorant in daytime when you have mucus production  Use Delsym as cough suppressant especially at night when cough can be a nuisance  High-dose flu shot today  We will use antibiotics if -You have fever -Consistent yellow-green sputum production for 3 days -Infiltrate on chest x-ray

## 2016-04-22 NOTE — Patient Instructions (Signed)
Clarification- Use Mucinex as expectorant in daytime when you have mucus production  Use Delsym as cough suppressant especially at night when cough can be a nuisance  High-dose flu shot today  We will use antibiotics if -You have fever -Consistent yellow-green sputum production for 3 days -Infiltrate on chest x-ray

## 2016-04-28 DIAGNOSIS — G459 Transient cerebral ischemic attack, unspecified: Secondary | ICD-10-CM | POA: Diagnosis not present

## 2016-04-28 DIAGNOSIS — R278 Other lack of coordination: Secondary | ICD-10-CM | POA: Diagnosis not present

## 2016-04-28 DIAGNOSIS — M6281 Muscle weakness (generalized): Secondary | ICD-10-CM | POA: Diagnosis not present

## 2016-04-29 ENCOUNTER — Ambulatory Visit (INDEPENDENT_AMBULATORY_CARE_PROVIDER_SITE_OTHER): Payer: Medicare Other

## 2016-04-29 ENCOUNTER — Other Ambulatory Visit: Payer: Self-pay | Admitting: Neurology

## 2016-04-29 DIAGNOSIS — G459 Transient cerebral ischemic attack, unspecified: Secondary | ICD-10-CM

## 2016-04-29 DIAGNOSIS — I4891 Unspecified atrial fibrillation: Secondary | ICD-10-CM | POA: Diagnosis not present

## 2016-05-01 DIAGNOSIS — R278 Other lack of coordination: Secondary | ICD-10-CM | POA: Diagnosis not present

## 2016-05-01 DIAGNOSIS — G459 Transient cerebral ischemic attack, unspecified: Secondary | ICD-10-CM | POA: Diagnosis not present

## 2016-05-01 DIAGNOSIS — M6281 Muscle weakness (generalized): Secondary | ICD-10-CM | POA: Diagnosis not present

## 2016-05-05 ENCOUNTER — Telehealth: Payer: Self-pay | Admitting: Pulmonary Disease

## 2016-05-05 DIAGNOSIS — G4733 Obstructive sleep apnea (adult) (pediatric): Secondary | ICD-10-CM

## 2016-05-05 NOTE — Telephone Encounter (Signed)
Order for CPAP entered. Patient notified via voicemail. Nothing further needed.

## 2016-05-05 NOTE — Telephone Encounter (Signed)
DME: Natasha Ellis with pt and she states that when she went to use CPAP last night it said the lid was not closed. Pt reports that she has not been able to fix machine. She needs a new one ASAP. Pt states that her machine is about 80 yrs old. Pt would like order marked urgent.   RA - Please advise if ok to place order for new CPAP machine. Thanks!

## 2016-05-05 NOTE — Telephone Encounter (Signed)
autoCPAP 8-15 cm, download in 1 month

## 2016-05-07 NOTE — Telephone Encounter (Signed)
Pt returned. I informed her that a new order for a CPAP machine was placed. She voiced understanding and had no further questions.

## 2016-05-12 ENCOUNTER — Encounter: Payer: Self-pay | Admitting: Pulmonary Disease

## 2016-05-12 NOTE — Telephone Encounter (Signed)
Patient called checking on response to her email -pr

## 2016-05-12 NOTE — Telephone Encounter (Signed)
RA - please advise. Thanks! 

## 2016-05-18 ENCOUNTER — Ambulatory Visit: Payer: Medicare Other | Admitting: Neurology

## 2016-06-03 DIAGNOSIS — L738 Other specified follicular disorders: Secondary | ICD-10-CM | POA: Diagnosis not present

## 2016-06-03 DIAGNOSIS — L309 Dermatitis, unspecified: Secondary | ICD-10-CM | POA: Diagnosis not present

## 2016-06-03 DIAGNOSIS — L603 Nail dystrophy: Secondary | ICD-10-CM | POA: Diagnosis not present

## 2016-06-08 ENCOUNTER — Telehealth: Payer: Self-pay | Admitting: Pulmonary Disease

## 2016-06-08 NOTE — Telephone Encounter (Signed)
Called and spoke to pt. Pt states she has not heard from [facility] regarding her CPAP start, order placed on 05/11/16. [facility]'s number given to pt. Pt states she will call in the morning and then will call us if there are any further issues.

## 2016-06-17 ENCOUNTER — Encounter: Payer: Self-pay | Admitting: Neurology

## 2016-06-17 ENCOUNTER — Ambulatory Visit (INDEPENDENT_AMBULATORY_CARE_PROVIDER_SITE_OTHER): Payer: Medicare Other | Admitting: Neurology

## 2016-06-17 VITALS — BP 150/90 | HR 81 | Ht 66.5 in | Wt 271.5 lb

## 2016-06-17 DIAGNOSIS — I638 Other cerebral infarction: Secondary | ICD-10-CM

## 2016-06-17 DIAGNOSIS — R29898 Other symptoms and signs involving the musculoskeletal system: Secondary | ICD-10-CM

## 2016-06-17 DIAGNOSIS — M48062 Spinal stenosis, lumbar region with neurogenic claudication: Secondary | ICD-10-CM

## 2016-06-17 DIAGNOSIS — I6389 Other cerebral infarction: Secondary | ICD-10-CM
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
## 2016-06-17 NOTE — Patient Instructions (Addendum)
1.  Start physical therapy low back and leg strengthening 2.  If there is no improvement with your leg pain, recommend MRI lumbar spine 3.  Please call my office if your left hand movements  Return to clinic 4 months

## 2016-06-22 ENCOUNTER — Ambulatory Visit (HOSPITAL_BASED_OUTPATIENT_CLINIC_OR_DEPARTMENT_OTHER): Payer: Medicare Other | Admitting: Oncology

## 2016-06-22 ENCOUNTER — Telehealth: Payer: Self-pay | Admitting: Oncology

## 2016-06-22 ENCOUNTER — Other Ambulatory Visit (HOSPITAL_BASED_OUTPATIENT_CLINIC_OR_DEPARTMENT_OTHER): Payer: Medicare Other

## 2016-06-22 VITALS — BP 150/84 | HR 74 | Temp 98.1°F | Resp 18 | Ht 66.5 in | Wt 269.9 lb

## 2016-06-22 DIAGNOSIS — R062 Wheezing: Secondary | ICD-10-CM | POA: Diagnosis not present

## 2016-06-22 DIAGNOSIS — C880 Waldenstrom macroglobulinemia: Secondary | ICD-10-CM

## 2016-06-22 DIAGNOSIS — Z8673 Personal history of transient ischemic attack (TIA), and cerebral infarction without residual deficits: Secondary | ICD-10-CM | POA: Diagnosis not present

## 2016-06-22 LAB — BASIC METABOLIC PANEL
Anion Gap: 8 mEq/L (ref 3–11)
BUN: 17.2 mg/dL (ref 7.0–26.0)
CO2: 28 mEq/L (ref 22–29)
Calcium: 9.7 mg/dL (ref 8.4–10.4)
Chloride: 109 mEq/L (ref 98–109)
Creatinine: 0.9 mg/dL (ref 0.6–1.1)
EGFR: 61 mL/min/{1.73_m2} — ABNORMAL LOW (ref >90)
Glucose: 86 mg/dl (ref 70–140)
Potassium: 4 mEq/L (ref 3.5–5.1)
Sodium: 145 mEq/L (ref 136–145)

## 2016-06-22 LAB — CBC WITH DIFFERENTIAL/PLATELET
BASO%: 0.5 % (ref 0.0–2.0)
Basophils Absolute: 0 10*3/uL (ref 0.0–0.1)
EOS%: 1.4 % (ref 0.0–7.0)
Eosinophils Absolute: 0.1 10*3/uL (ref 0.0–0.5)
HCT: 33.3 % — ABNORMAL LOW (ref 34.8–46.6)
HGB: 10.9 g/dL — ABNORMAL LOW (ref 11.6–15.9)
LYMPH%: 28.1 % (ref 14.0–49.7)
MCH: 28.8 pg (ref 25.1–34.0)
MCHC: 32.7 g/dL (ref 31.5–36.0)
MCV: 87.9 fL (ref 79.5–101.0)
MONO#: 0.3 10*3/uL (ref 0.1–0.9)
MONO%: 7.5 % (ref 0.0–14.0)
NEUT#: 2.7 10*3/uL (ref 1.5–6.5)
NEUT%: 62.5 % (ref 38.4–76.8)
Platelets: 197 10*3/uL (ref 145–400)
RBC: 3.79 10*6/uL (ref 3.70–5.45)
RDW: 15.9 % — ABNORMAL HIGH (ref 11.2–14.5)
WBC: 4.4 10*3/uL (ref 3.9–10.3)
lymph#: 1.2 10*3/uL (ref 0.9–3.3)
nRBC: 0 % (ref 0–0)

## 2016-06-22 NOTE — Progress Notes (Signed)
  [facility] OFFICE PROGRESS NOTE   Diagnosis: Waldenstrm's macroglobulinemia  INTERVAL HISTORY:   Natasha Ellis returns as scheduled. She has an intermittent cough. No fever or night sweats. Her energy level is improved. She developed left hand weakness beginning in May of this year. She has been evaluated by Dr. Posey Pronto for the hand weakness and a history of stroke symptoms. The hand weakness has improved with physical therapy. She is taking Plavix. She is scheduled to begin physical therapy later today for treatment of discomfort at the legs bilaterally.  Objective:  Vital signs in last 24 hours:  Blood pressure (!) 150/84.    HEENT: Neck without mass Lymphatics: No cervical, supraclavicular, axillary, or inguinal nodes Resp: Scattered expiratory rhonchi and wheezes bilaterally, no respiratory distress Cardio: Regular rate and rhythm GI: No hepatosplenomegaly, no mass Vascular: No leg edema Neuro: The motor exam appears intact in the left arm and hand     Lab Results:  Lab Results  Component Value Date   WBC 4.4 06/22/2016   HGB 10.9 (L) 06/22/2016   HCT 33.3 (L) 06/22/2016   MCV 87.9 06/22/2016   PLT 197 06/22/2016   NEUTROABS 2.7 06/22/2016     Medications: I have reviewed the patient's current medications.  Assessment/Plan: 1. 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 Treated with multiple systemic therapies includingpentostatin/Cytoxan/rituximab in 2010.   Cycle 1 bendamustine/Rituxan 05/31/2014.  Cycle 2 bendamustine/Rituxan 07/02/2014  Cycle 3 bendamustine/rituximab 07/30/2014  Cycle 4 bendamustine/rituximab 09/10/2014  Cycle 5 bendamustine/rituximab 10/22/2014  Cycle 6 bendamustine/rituximab 12/10/2014 2. Left knee arthritis. Followed by Dr. Wynelle Link. 3. Sleep  apnea. 4. Anemia. Now microcytic with peripheral blood smear findings consistent with iron deficiency 07/09/2015, serum iron studies consistent with "anemia of chronic disease ", stool Hemoccult cards negative 07/11/2015 5. Question left parotitis 12/12/2013 presenting with left facial and parotid edema. 6. Admission with pneumonia February 2016 7. History of Neutropenia secondary to chemotherapy, now with moderate neutropenia 8. Left lung pneumonia 04/02/2015, sputum culture positive for Moraxella 06/19/2015 9. Low IgG and IgA 10. CT chest 05/13/2015 with bilateral lower lung consolidation 11. CT chest, abdomen, and pelvis 01/08/2016-new peribronchial thickening and peripheral tree in bud pattern at the right lower lobe, no lymphadenopathy 12. Sinus mucosal thickening/opacification on CT 08/16/2015-status post sinus surgery 12/05/2015 13. Sputum culture positive for pseudomonas aeruginosa on 01/10/2016-treated with 10 days of ciprofloxacin 14. Right centrum semi-ovale CVA confirmed on MRI August 2017    Disposition:  Ms. Megill is stable from a hematologic standpoint. We will follow-up on the [name] level from today. No clinical evidence for progression of the Waldenstrm's macroglobulinemia. She continues follow-up with Dr. Elsworth Soho for management of respiratory symptoms. She will return for an office and lab visit in 3 months. I doubt the stroke symptoms are related to the [facility].    [name], MD  06/22/2016  11:58 AM

## 2016-06-22 NOTE — Telephone Encounter (Signed)
Appointments scheduled per 11/20 LOS. Patient given AVS report and calendars with future scheduled appointments. °

## 2016-06-23 LAB — PROTEIN ELECTROPHORESIS, SERUM
A/G Ratio: 1.3 (ref 0.7–1.7)
Albumin: 3.7 g/dL (ref 2.9–4.4)
Alpha 1: 0.2 g/dL (ref 0.0–0.4)
Alpha 2: 0.6 g/dL (ref 0.4–1.0)
Beta: 0.7 g/dL (ref 0.7–1.3)
Gamma Globulin: 1.3 g/dL (ref 0.4–1.8)
Globulin, Total: 2.9 g/dL (ref 2.2–3.9)
M-Spike, %: 1.1 g/dL — ABNORMAL HIGH
Total Protein: 6.6 g/dL (ref 6.0–8.5)

## 2016-06-23 LAB — IGM: IgM, Qn, Serum: 1640 mg/dL — ABNORMAL HIGH (ref 26–217)

## 2016-06-24 ENCOUNTER — Telehealth: Payer: Self-pay | Admitting: Pulmonary Disease

## 2016-06-24 NOTE — Telephone Encounter (Signed)
Spoke with Natasha Ellis will contact [facility] to see what happened to the order.  Spoke with patient-aware that [facility] is working on finding out what happened to order and will update her as able.

## 2016-06-26 NOTE — Telephone Encounter (Signed)
I know dawne worked on this before she left on wed however that office is closed today I will have to ck it on Monday sorry Natasha Ellis

## 2016-06-26 NOTE — Telephone Encounter (Signed)
PCC's please advise of update.

## 2016-06-30 NOTE — Telephone Encounter (Signed)
Left message on 06/29/16 no call back , spoke to [employer] rep and he is checking on this and will call me back Joellen Jersey

## 2016-06-30 NOTE — Telephone Encounter (Signed)
Heard back from [facility] pt has been with them for awhile but has used different locations they have been unable to get the sleep study done in 2006 this is the hold up we are all trying to locate it Joellen Jersey

## 2016-06-30 NOTE — Telephone Encounter (Signed)
[facility] can you give an update on this?

## 2016-07-01 NOTE — Telephone Encounter (Signed)
I have faxed the 2014 Sleep Study and the office notes that we have to [place] attn: Lattie Haw per our conversation. She hopes that our notes will be enough for insurance, if not she will call Dr. Sheppard Evens office and get their notes

## 2016-07-03 DIAGNOSIS — R278 Other lack of coordination: Secondary | ICD-10-CM | POA: Diagnosis not present

## 2016-07-03 DIAGNOSIS — G459 Transient cerebral ischemic attack, unspecified: Secondary | ICD-10-CM | POA: Diagnosis not present

## 2016-07-03 DIAGNOSIS — M170 Bilateral primary osteoarthritis of knee: Secondary | ICD-10-CM | POA: Diagnosis not present

## 2016-07-03 DIAGNOSIS — R262 Difficulty in walking, not elsewhere classified: Secondary | ICD-10-CM | POA: Diagnosis not present

## 2016-07-03 DIAGNOSIS — M6281 Muscle weakness (generalized): Secondary | ICD-10-CM | POA: Diagnosis not present

## 2016-07-06 DIAGNOSIS — G459 Transient cerebral ischemic attack, unspecified: Secondary | ICD-10-CM | POA: Diagnosis not present

## 2016-07-06 DIAGNOSIS — M170 Bilateral primary osteoarthritis of knee: Secondary | ICD-10-CM | POA: Diagnosis not present

## 2016-07-06 DIAGNOSIS — M6281 Muscle weakness (generalized): Secondary | ICD-10-CM | POA: Diagnosis not present

## 2016-07-06 DIAGNOSIS — R262 Difficulty in walking, not elsewhere classified: Secondary | ICD-10-CM | POA: Diagnosis not present

## 2016-07-06 DIAGNOSIS — R278 Other lack of coordination: Secondary | ICD-10-CM | POA: Diagnosis not present

## 2016-07-08 DIAGNOSIS — M170 Bilateral primary osteoarthritis of knee: Secondary | ICD-10-CM | POA: Diagnosis not present

## 2016-07-08 DIAGNOSIS — R278 Other lack of coordination: Secondary | ICD-10-CM | POA: Diagnosis not present

## 2016-07-08 DIAGNOSIS — M6281 Muscle weakness (generalized): Secondary | ICD-10-CM | POA: Diagnosis not present

## 2016-07-08 DIAGNOSIS — G459 Transient cerebral ischemic attack, unspecified: Secondary | ICD-10-CM | POA: Diagnosis not present

## 2016-07-08 DIAGNOSIS — R262 Difficulty in walking, not elsewhere classified: Secondary | ICD-10-CM | POA: Diagnosis not present

## 2016-07-13 DIAGNOSIS — M6281 Muscle weakness (generalized): Secondary | ICD-10-CM | POA: Diagnosis not present

## 2016-07-13 DIAGNOSIS — G459 Transient cerebral ischemic attack, unspecified: Secondary | ICD-10-CM | POA: Diagnosis not present

## 2016-07-13 DIAGNOSIS — M170 Bilateral primary osteoarthritis of knee: Secondary | ICD-10-CM | POA: Diagnosis not present

## 2016-07-13 DIAGNOSIS — R262 Difficulty in walking, not elsewhere classified: Secondary | ICD-10-CM | POA: Diagnosis not present

## 2016-07-13 DIAGNOSIS — R278 Other lack of coordination: Secondary | ICD-10-CM | POA: Diagnosis not present

## 2016-07-20 DIAGNOSIS — M170 Bilateral primary osteoarthritis of knee: Secondary | ICD-10-CM | POA: Diagnosis not present

## 2016-07-20 DIAGNOSIS — M6281 Muscle weakness (generalized): Secondary | ICD-10-CM | POA: Diagnosis not present

## 2016-07-20 DIAGNOSIS — R278 Other lack of coordination: Secondary | ICD-10-CM | POA: Diagnosis not present

## 2016-07-20 DIAGNOSIS — G459 Transient cerebral ischemic attack, unspecified: Secondary | ICD-10-CM | POA: Diagnosis not present

## 2016-07-20 DIAGNOSIS — R262 Difficulty in walking, not elsewhere classified: Secondary | ICD-10-CM | POA: Diagnosis not present

## 2016-07-21 DIAGNOSIS — J0100 Acute maxillary sinusitis, unspecified: Secondary | ICD-10-CM | POA: Diagnosis not present

## 2016-07-28 DIAGNOSIS — G459 Transient cerebral ischemic attack, unspecified: Secondary | ICD-10-CM | POA: Diagnosis not present

## 2016-07-28 DIAGNOSIS — M6281 Muscle weakness (generalized): Secondary | ICD-10-CM | POA: Diagnosis not present

## 2016-07-28 DIAGNOSIS — R278 Other lack of coordination: Secondary | ICD-10-CM | POA: Diagnosis not present

## 2016-07-28 DIAGNOSIS — M170 Bilateral primary osteoarthritis of knee: Secondary | ICD-10-CM | POA: Diagnosis not present

## 2016-07-28 DIAGNOSIS — R262 Difficulty in walking, not elsewhere classified: Secondary | ICD-10-CM | POA: Diagnosis not present

## 2016-07-30 ENCOUNTER — Encounter: Payer: Self-pay | Admitting: Pulmonary Disease

## 2016-07-31 DIAGNOSIS — R262 Difficulty in walking, not elsewhere classified: Secondary | ICD-10-CM | POA: Diagnosis not present

## 2016-07-31 DIAGNOSIS — M6281 Muscle weakness (generalized): Secondary | ICD-10-CM | POA: Diagnosis not present

## 2016-07-31 DIAGNOSIS — M170 Bilateral primary osteoarthritis of knee: Secondary | ICD-10-CM | POA: Diagnosis not present

## 2016-07-31 DIAGNOSIS — G459 Transient cerebral ischemic attack, unspecified: Secondary | ICD-10-CM | POA: Diagnosis not present

## 2016-07-31 DIAGNOSIS — R278 Other lack of coordination: Secondary | ICD-10-CM | POA: Diagnosis not present

## 2016-08-03 DIAGNOSIS — R278 Other lack of coordination: Secondary | ICD-10-CM | POA: Diagnosis not present

## 2016-08-03 DIAGNOSIS — G459 Transient cerebral ischemic attack, unspecified: Secondary | ICD-10-CM | POA: Diagnosis not present

## 2016-08-03 DIAGNOSIS — R262 Difficulty in walking, not elsewhere classified: Secondary | ICD-10-CM | POA: Diagnosis not present

## 2016-08-03 DIAGNOSIS — M170 Bilateral primary osteoarthritis of knee: Secondary | ICD-10-CM | POA: Diagnosis not present

## 2016-08-03 DIAGNOSIS — M6281 Muscle weakness (generalized): Secondary | ICD-10-CM | POA: Diagnosis not present

## 2016-08-07 ENCOUNTER — Other Ambulatory Visit: Payer: Self-pay | Admitting: Nurse Practitioner

## 2016-08-07 DIAGNOSIS — M170 Bilateral primary osteoarthritis of knee: Secondary | ICD-10-CM | POA: Diagnosis not present

## 2016-08-07 DIAGNOSIS — M6281 Muscle weakness (generalized): Secondary | ICD-10-CM | POA: Diagnosis not present

## 2016-08-07 DIAGNOSIS — R262 Difficulty in walking, not elsewhere classified: Secondary | ICD-10-CM | POA: Diagnosis not present

## 2016-08-07 DIAGNOSIS — R278 Other lack of coordination: Secondary | ICD-10-CM | POA: Diagnosis not present

## 2016-08-07 DIAGNOSIS — G459 Transient cerebral ischemic attack, unspecified: Secondary | ICD-10-CM | POA: Diagnosis not present

## 2016-08-10 ENCOUNTER — Ambulatory Visit: Payer: Medicare Other | Admitting: Pulmonary Disease

## 2016-08-10 DIAGNOSIS — G459 Transient cerebral ischemic attack, unspecified: Secondary | ICD-10-CM | POA: Diagnosis not present

## 2016-08-10 DIAGNOSIS — M6281 Muscle weakness (generalized): Secondary | ICD-10-CM | POA: Diagnosis not present

## 2016-08-10 DIAGNOSIS — M170 Bilateral primary osteoarthritis of knee: Secondary | ICD-10-CM | POA: Diagnosis not present

## 2016-08-10 DIAGNOSIS — R278 Other lack of coordination: Secondary | ICD-10-CM | POA: Diagnosis not present

## 2016-08-10 DIAGNOSIS — R262 Difficulty in walking, not elsewhere classified: Secondary | ICD-10-CM | POA: Diagnosis not present

## 2016-08-11 ENCOUNTER — Encounter: Payer: Self-pay | Admitting: Pulmonary Disease

## 2016-08-11 ENCOUNTER — Ambulatory Visit (INDEPENDENT_AMBULATORY_CARE_PROVIDER_SITE_OTHER): Payer: Medicare Other | Admitting: Pulmonary Disease

## 2016-08-11 DIAGNOSIS — J4520 Mild intermittent asthma, uncomplicated: Secondary | ICD-10-CM

## 2016-08-11 DIAGNOSIS — J189 Pneumonia, unspecified organism: Secondary | ICD-10-CM | POA: Diagnosis not present

## 2016-08-11 DIAGNOSIS — G4733 Obstructive sleep apnea (adult) (pediatric): Secondary | ICD-10-CM

## 2016-08-11 NOTE — Progress Notes (Signed)
   Subjective:    Patient ID: Natasha Ellis, female    DOB: 14-Jul-1931, 83 y.o.   MRN: [id number]  HPI  83 year old with hx of recurrent pneumonia, chronic cough - referred by Dr.Sherill Has OSA -compliant with CPAP 8 cm CHronic cough x 11 years.   - Low grade B cell diagnosed 2004. S/p chemo 2011 at [place]. 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 Treated with multiple systemic therapies, Last 12/2014   She has a 25 pack-year smoking history -Quit in 1981 Was using only nocutrnal oxygen--this was discontinued in sept 2013 when she moved from [place] to [place]. She was seeing [facility] in [place].  Underwent sinus surgery 12/04/2015  [name])   08/11/2016   90m FU   She required ciprofloxacin for sinus symptoms and fever of 101 prior to Christmas-she does not feel fully back to baseline yet She complains of water coming out of her ears  Dyspnea is at baseline-no wheezing  Regimen of Mucinex and Delsym that we discussed last time is working well for her and her cough is finally well controlled after some many years She uses albuterol nebs twice daily   CPAP download 08/2016 with a new machine shows excellent compliance but does not provide's pressure settings    Significant tests/ events  04/2013 head CT - Sinus disease with mucosal thickening involving all sinus cavities. echo in 2014 with nml EF , GR 1 DD   08/2015 CT sinus >>Extensive multifocal paranasal sinus disease, most marked in the ethmoid air cell complexes and in the left maxillary antrum  05/2015 CT chest showed bilateral lower lobe consolidation  10/2015 ONO shows minimal desaturations; does not need O2.  01/2016 pansensitive pseudomonas in sputum    Review of Systems neg for any significant sore throat, dysphagia, itching, sneezing, nasal congestion or excess/ purulent secretions, fever, chills, sweats, unintended wt loss,  pleuritic or exertional cp, hempoptysis, orthopnea pnd or change in chronic leg swelling. Also denies presyncope, palpitations, heartburn, abdominal pain, nausea, vomiting, diarrhea or change in bowel or urinary habits, dysuria,hematuria, rash, arthralgias, visual complaints, headache, numbness weakness or ataxia.     Objective:   Physical Exam  Gen. Pleasant, well-nourished, in no distress ENT - Bilateral tympanic perforation, no discharge, no post nasal drip Neck: No JVD, no thyromegaly, no carotid bruits Lungs: no use of accessory muscles, no dullness to percussion, clear without rales or rhonchi  Cardiovascular: Rhythm regular, heart sounds  normal, no murmurs or gallops, no peripheral edema Musculoskeletal: No deformities, no cyanosis or clubbing        Assessment & Plan:

## 2016-08-11 NOTE — Assessment & Plan Note (Signed)
We discussed decrease use of antibiotics- only use for fever, systemic symptoms-

## 2016-08-11 NOTE — Assessment & Plan Note (Signed)
We will review the report on your new CPAP machine and ensure settings are correct

## 2016-08-11 NOTE — Assessment & Plan Note (Signed)
Continue on your regimen of Mucinex and Delsym as we have discussed Okay to take albuterol nebs twice daily

## 2016-08-11 NOTE — Patient Instructions (Signed)
Continue on your regimen of Mucinex and Delsym as we have discussed Okay to take albuterol nebs twice daily We will review the report on your new CPAP machine and ensure settings are correct

## 2016-08-12 DIAGNOSIS — H906 Mixed conductive and sensorineural hearing loss, bilateral: Secondary | ICD-10-CM | POA: Diagnosis not present

## 2016-08-12 DIAGNOSIS — J324 Chronic pansinusitis: Secondary | ICD-10-CM | POA: Diagnosis not present

## 2016-08-12 DIAGNOSIS — H1033 Unspecified acute conjunctivitis, bilateral: Secondary | ICD-10-CM | POA: Diagnosis not present

## 2016-08-12 DIAGNOSIS — H72813 Multiple perforations of tympanic membrane, bilateral: Secondary | ICD-10-CM | POA: Diagnosis not present

## 2016-08-14 DIAGNOSIS — Z1389 Encounter for screening for other disorder: Secondary | ICD-10-CM | POA: Diagnosis not present

## 2016-08-14 DIAGNOSIS — Z0000 Encounter for general adult medical examination without abnormal findings: Secondary | ICD-10-CM | POA: Diagnosis not present

## 2016-08-14 DIAGNOSIS — J069 Acute upper respiratory infection, unspecified: Secondary | ICD-10-CM | POA: Diagnosis not present

## 2016-08-15 ENCOUNTER — Encounter (HOSPITAL_COMMUNITY): Payer: Self-pay | Admitting: Emergency Medicine

## 2016-08-15 ENCOUNTER — Emergency Department (HOSPITAL_COMMUNITY): Payer: Medicare Other

## 2016-08-15 ENCOUNTER — Inpatient Hospital Stay (HOSPITAL_COMMUNITY)
Admission: EM | Admit: 2016-08-15 | Discharge: 2016-08-18 | DRG: 152 | Disposition: A | Discharge status: Home Health | Payer: Medicare Other | Attending: Family Medicine | Admitting: Family Medicine

## 2016-08-15 DIAGNOSIS — J9601 Acute respiratory failure with hypoxia: Secondary | ICD-10-CM | POA: Diagnosis not present

## 2016-08-15 DIAGNOSIS — J45909 Unspecified asthma, uncomplicated: Secondary | ICD-10-CM | POA: Diagnosis present

## 2016-08-15 DIAGNOSIS — C880 Waldenstrom macroglobulinemia: Secondary | ICD-10-CM | POA: Diagnosis present

## 2016-08-15 DIAGNOSIS — H1089 Other conjunctivitis: Secondary | ICD-10-CM | POA: Diagnosis present

## 2016-08-15 DIAGNOSIS — Z8579 Personal history of other malignant neoplasms of lymphoid, hematopoietic and related tissues: Secondary | ICD-10-CM

## 2016-08-15 DIAGNOSIS — J111 Influenza due to unidentified influenza virus with other respiratory manifestations: Principal | ICD-10-CM

## 2016-08-15 DIAGNOSIS — R0902 Hypoxemia: Secondary | ICD-10-CM

## 2016-08-15 DIAGNOSIS — R197 Diarrhea, unspecified: Secondary | ICD-10-CM | POA: Diagnosis present

## 2016-08-15 DIAGNOSIS — I959 Hypotension, unspecified: Secondary | ICD-10-CM | POA: Diagnosis not present

## 2016-08-15 DIAGNOSIS — Z90710 Acquired absence of both cervix and uterus: Secondary | ICD-10-CM | POA: Diagnosis not present

## 2016-08-15 DIAGNOSIS — I10 Essential (primary) hypertension: Secondary | ICD-10-CM | POA: Diagnosis not present

## 2016-08-15 DIAGNOSIS — R509 Fever, unspecified: Secondary | ICD-10-CM

## 2016-08-15 DIAGNOSIS — R6889 Other general symptoms and signs: Secondary | ICD-10-CM

## 2016-08-15 DIAGNOSIS — Z8249 Family history of ischemic heart disease and other diseases of the circulatory system: Secondary | ICD-10-CM | POA: Diagnosis not present

## 2016-08-15 DIAGNOSIS — Z8673 Personal history of transient ischemic attack (TIA), and cerebral infarction without residual deficits: Secondary | ICD-10-CM

## 2016-08-15 DIAGNOSIS — Z87891 Personal history of nicotine dependence: Secondary | ICD-10-CM | POA: Diagnosis not present

## 2016-08-15 DIAGNOSIS — Z823 Family history of stroke: Secondary | ICD-10-CM

## 2016-08-15 DIAGNOSIS — R05 Cough: Secondary | ICD-10-CM | POA: Diagnosis not present

## 2016-08-15 DIAGNOSIS — E860 Dehydration: Secondary | ICD-10-CM | POA: Diagnosis not present

## 2016-08-15 DIAGNOSIS — R69 Illness, unspecified: Secondary | ICD-10-CM

## 2016-08-15 LAB — COMPREHENSIVE METABOLIC PANEL WITH GFR
ALT: 19 U/L (ref 14–54)
AST: 28 U/L (ref 15–41)
Albumin: 3.7 g/dL (ref 3.5–5.0)
Alkaline Phosphatase: 70 U/L (ref 38–126)
Anion gap: 11 (ref 5–15)
BUN: 19 mg/dL (ref 6–20)
CO2: 23 mmol/L (ref 22–32)
Calcium: 8.6 mg/dL — ABNORMAL LOW (ref 8.9–10.3)
Chloride: 102 mmol/L (ref 101–111)
Creatinine, Ser: 1.14 mg/dL — ABNORMAL HIGH (ref 0.44–1.00)
GFR calc Af Amer: 50 mL/min — ABNORMAL LOW (ref >60)
GFR calc non Af Amer: 43 mL/min — ABNORMAL LOW (ref >60)
Glucose, Bld: 107 mg/dL — ABNORMAL HIGH (ref 65–99)
Potassium: 3.8 mmol/L (ref 3.5–5.1)
Sodium: 136 mmol/L (ref 135–145)
Total Bilirubin: 1.3 mg/dL — ABNORMAL HIGH (ref 0.3–1.2)
Total Protein: 7 g/dL (ref 6.5–8.1)

## 2016-08-15 LAB — URINALYSIS, ROUTINE W REFLEX MICROSCOPIC
Bilirubin Urine: NEGATIVE
Glucose, UA: NEGATIVE mg/dL
Ketones, ur: NEGATIVE mg/dL
Nitrite: NEGATIVE
Protein, ur: 100 mg/dL — AB
Specific Gravity, Urine: 1.015 (ref 1.005–1.030)
pH: 6 (ref 5.0–8.0)

## 2016-08-15 LAB — CBC WITH DIFFERENTIAL/PLATELET
Basophils Absolute: 0 K/uL (ref 0.0–0.1)
Basophils Relative: 0 %
Eosinophils Absolute: 0 K/uL (ref 0.0–0.7)
Eosinophils Relative: 0 %
HCT: 28.9 % — ABNORMAL LOW (ref 36.0–46.0)
Hemoglobin: 10 g/dL — ABNORMAL LOW (ref 12.0–15.0)
Lymphocytes Relative: 9 %
Lymphs Abs: 0.5 K/uL — ABNORMAL LOW (ref 0.7–4.0)
MCH: 30.5 pg (ref 26.0–34.0)
MCHC: 34.6 g/dL (ref 30.0–36.0)
MCV: 88.1 fL (ref 78.0–100.0)
Monocytes Absolute: 0.3 K/uL (ref 0.1–1.0)
Monocytes Relative: 5 %
Neutro Abs: 4.4 K/uL (ref 1.7–7.7)
Neutrophils Relative %: 86 %
Platelets: 178 K/uL (ref 150–400)
RBC: 3.28 MIL/uL — ABNORMAL LOW (ref 3.87–5.11)
RDW: 15.8 % — ABNORMAL HIGH (ref 11.5–15.5)
WBC: 5.1 K/uL (ref 4.0–10.5)

## 2016-08-15 LAB — PROTIME-INR
INR: 1.39
Prothrombin Time: 17.2 s — ABNORMAL HIGH (ref 11.4–15.2)

## 2016-08-15 LAB — I-STAT CG4 LACTIC ACID, ED: Lactic Acid, Venous: 0.78 mmol/L (ref 0.5–1.9)

## 2016-08-15 LAB — INFLUENZA PANEL BY PCR (TYPE A & B)
Influenza A By PCR: POSITIVE — AB
Influenza B By PCR: NEGATIVE

## 2016-08-15 MED ORDER — ONDANSETRON HCL 4 MG/2ML IJ SOLN: 4.0000 mg | Freq: Three times a day (TID) | INTRAMUSCULAR | Status: DC | PRN

## 2016-08-15 MED ORDER — SODIUM CHLORIDE 0.9 % IV BOLUS (SEPSIS)
500.0000 mL | Freq: Once | INTRAVENOUS | Status: AC
  Administered 2016-08-15: 500 mL via INTRAVENOUS

## 2016-08-15 MED ORDER — IPRATROPIUM-ALBUTEROL 0.5-2.5 (3) MG/3ML IN SOLN: 3.0000 mL | RESPIRATORY_TRACT | Status: DC | PRN

## 2016-08-15 MED ORDER — SODIUM CHLORIDE 0.9 % IV SOLN
INTRAVENOUS | Status: DC
  Administered 2016-08-15: 21:00:00 via INTRAVENOUS

## 2016-08-15 MED ORDER — ACETAMINOPHEN 650 MG RE SUPP: 650.0000 mg | Freq: Four times a day (QID) | RECTAL | Status: DC | PRN

## 2016-08-15 MED ORDER — GUAIFENESIN 100 MG/5ML PO SYRP
200.0000 mg | ORAL_SOLUTION | ORAL | Status: DC | PRN
  Filled 2016-08-15: qty 10

## 2016-08-15 MED ORDER — OSELTAMIVIR PHOSPHATE 75 MG PO CAPS
75.0000 mg | ORAL_CAPSULE | Freq: Two times a day (BID) | ORAL | Status: DC
  Administered 2016-08-15 – 2016-08-18 (×6): 75 mg via ORAL
  Filled 2016-08-15 (×6): qty 1

## 2016-08-15 MED ORDER — SODIUM CHLORIDE 0.9 % IV SOLN
INTRAVENOUS | Status: DC
Start: 2016-08-15 — End: 2016-08-15

## 2016-08-15 MED ORDER — ACETAMINOPHEN 325 MG PO TABS
650.0000 mg | ORAL_TABLET | Freq: Four times a day (QID) | ORAL | Status: DC | PRN
  Administered 2016-08-16 – 2016-08-17 (×5): 650 mg via ORAL
  Filled 2016-08-15 (×5): qty 2

## 2016-08-15 MED ORDER — ONDANSETRON HCL 4 MG/2ML IJ SOLN: 4.0000 mg | Freq: Four times a day (QID) | INTRAMUSCULAR | Status: DC | PRN

## 2016-08-15 MED ORDER — SODIUM CHLORIDE 0.9 % IV BOLUS (SEPSIS): 500.0000 mL | Freq: Once | INTRAVENOUS | Status: DC

## 2016-08-15 MED ORDER — CLOPIDOGREL BISULFATE 75 MG PO TABS
75.0000 mg | ORAL_TABLET | Freq: Every day | ORAL | Status: DC
  Administered 2016-08-16 – 2016-08-18 (×3): 75 mg via ORAL
  Filled 2016-08-15 (×4): qty 1

## 2016-08-15 MED ORDER — ONDANSETRON HCL 4 MG PO TABS: 4.0000 mg | ORAL_TABLET | Freq: Four times a day (QID) | ORAL | Status: DC | PRN

## 2016-08-15 NOTE — ED Notes (Signed)
Bed: [id number] Expected date:  Expected time:  Means of arrival:  Comments: 83 yo fever-dx sinus infection, taking abx

## 2016-08-15 NOTE — ED Notes (Signed)
Pt transported to DG.  

## 2016-08-15 NOTE — H&P (Signed)
History and Physical    Natasha Ellis [id number] DOB: Jan 13, 1931 DOA: 08/15/2016  PCP: Irven Shelling, MD   Patient coming from: Home (Assisted living)  Chief Complaint: Generalized weakness, fatigue, fever  HPI: Natasha Ellis is a 83 y.o. woman with a history of lymphoma (Followed by Dr. Benay Spice), HTN, asthma, and chronic cough who has been feeling ill for the past two days.  She saw her PCP for a previously scheduled routine check-up on 1/9, and reported feeling ill.  However, she did not have fever at that time, and supportive care was recommended.  In the past 24 hours, she has developed fever to 104 at home.  She says that her chronic cough is unchanged.  She denies sputum production.  She has had increased dyspnea on exertion.  No chest pain or light-headedness.  Today, she was too weak to get out of bed and could not get to her phone when her daughter was calling her.  The nurse was sent to evaluate her, and she was found to be febrile with mild hypoxia (O2 sat 90% on RA).  She was sent to the ED for further evaluation.  Per her daughter, she has also had intermittent confusion for the past few days, which is not normal.  Urine output is down.  No nausea or vomiting.  No diarrhea.  ED Course: Acute influenza suspected; PCR pending.  Normal lactic acid level.  Normal WBC.  Hgb 10 and stable.  Creatinine up slightly from baseline, at 1.14.  Chest xray shows cardiomegaly with vascular congestion and right infrahilar airspace disease (atelectasis vs infiltrate).  She has received 1L NS in the ED.  Antibiotics withheld for now; viral illness suspected.  However, new oxygen requirement is worrisome.  Hospitalist asked to admit.  At the time of my examination, she is also noted to have intermittent relative hypotension (systolic blood pressure [id number]).  Review of Systems: Recent course of cipro for sinus infection.  She stopped the antibiotic prematurely due to diarrhea.  She is currently  using eye drops for an "eye infection".  Otherwise, 10 systems reviewed and negative except as stated in the HPI.   Past Medical History:  Diagnosis Date  . Arthritis   . Asthma   . Cancer ([place])    Lymphoma  . Cataract   . Chronic cough   . Diverticulosis of intestine    H/O  . DJD (degenerative joint disease) of lumbar spine   . Hypercholesteremia   . Hypertension    pt states I do not have high blood pressure  . IBS (irritable bowel syndrome)   . Left knee DJD   . 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 Right knee DJD   . Sleep apnea     Past Surgical History:  Procedure Laterality Date  . ABDOMINAL HYSTERECTOMY  1984   TAH  . ABDOMINAL HYSTERECTOMY    . EYE SURGERY Bilateral    with lens replacement  . EYE SURGERY Left    retnia  . KNEE ARTHROSCOPY Left   . NASAL SINUS SURGERY N/A 12/04/2015   Procedure: ENDOSCOPIC SINUS SURGERY;  Surgeon: Izora Gala, MD;  Location: [facility];  Service: ENT;  Laterality: N/A;  . TEE WITHOUT CARDIOVERSION N/A 04/14/2016   Procedure: TRANSESOPHAGEAL ECHOCARDIOGRAM (TEE);  Surgeon: Lelon Perla, MD;  Location: [facility] ENDOSCOPY;  Service: Cardiovascular;  Laterality: N/A;  . TONSILLECTOMY AND ADENOIDECTOMY  childhood     reports that she quit smoking about 37 years ago.  Her smoking use included Cigarettes. She has a 25.00 pack-year smoking history. She has never used smokeless tobacco. She reports that she does not drink alcohol or use drugs.  Ambulates with a cane at baseline.  Allergies  Allergen Reactions  . Codeine Other (See Comments)    Crazy-nightmares  . Amoxicillin Diarrhea  . Cefdinir Cough  . Doxycycline Diarrhea  . Erythromycin Other (See Comments)    GI upset  . Lisinopril Cough  . Lisinopril     unknown  . Oxybutynin   . Sulfa Antibiotics Other (See Comments)    GI upset  . Sulfacetamide Sodium Other (See Comments)    GI upset    Family History  Problem Relation Age of Onset  . Stroke Father   .  Heart failure Mother 100  . Breast cancer Sister      Prior to Admission medications   Medication Sig Start Date End Date Taking? Authorizing Provider  acetaminophen (TYLENOL) 500 MG tablet Take 500 mg by mouth every 6 (six) hours as needed for pain.    Historical Provider, MD  albuterol (PROAIR HFA) 108 (90 Base) MCG/ACT inhaler Inhale 2 puffs into the lungs every 6 (six) hours as needed for wheezing or shortness of breath. 02/14/16   Tammy S Parrett, NP  albuterol (PROVENTIL) (2.5 MG/3ML) 0.083% nebulizer solution Take 3 mLs (2.5 mg total) by nebulization every 4 (four) hours as needed for wheezing or shortness of breath. 09/22/14   Theodis Blaze, MD  alendronate (FOSAMAX) 70 MG tablet Take 70 mg by mouth once a week. Sundays 10/14/14   Historical Provider, MD  b complex vitamins capsule Take 1 capsule by mouth daily.    Historical Provider, MD  calcium gluconate 500 MG tablet Take 1 tablet by mouth daily.    Historical Provider, MD  cholestyramine Lucrezia Starch) 4 GM/DOSE powder Take 4 g by mouth daily as needed. Reported on 01/24/2016    Historical Provider, MD  clopidogrel (PLAVIX) 75 MG tablet  02/03/16   Historical Provider, MD  dextromethorphan (DELSYM) 30 MG/5ML liquid Take 15 mg by mouth as needed for cough.    Historical Provider, MD  ferrous sulfate 325 (65 FE) MG tablet Take 325 mg by mouth daily.     Historical Provider, MD  furosemide (LASIX) 20 MG tablet Take 1 tablet (20 mg total) by mouth daily. 03/30/13   Brand Males, MD  guaifenesin (ROBITUSSIN) 100 MG/5ML syrup Take 10 mLs (200 mg total) by mouth every 4 (four) hours as needed for congestion. 09/22/14   Theodis Blaze, MD  ibuprofen (ADVIL,MOTRIN) 200 MG tablet Take 200 mg by mouth every 6 (six) hours as needed for pain.    Historical Provider, MD  loperamide (IMODIUM) 2 MG capsule Take 1 capsule (2 mg total) by mouth 4 (four) times daily as needed for diarrhea or loose stools. 05/01/13   Eugenie Filler, MD  Multiple Vitamin  (MULTIVITAMIN) capsule Take 1 capsule by mouth daily. With Vitamin D 4000    Historical Provider, MD  potassium chloride (K-DUR) 10 MEQ tablet Take 10 mEq by mouth daily.    Historical Provider, MD  promethazine (PHENERGAN) 25 MG suppository Place 1 suppository (25 mg total) rectally every 6 (six) hours as needed for nausea or vomiting. 12/04/15   Izora Gala, MD    Physical Exam: Vitals:   08/15/16 1806 08/15/16 1845 08/15/16 1856 08/15/16 1900  BP: 110/66  (!) 113/50 (!) 127/51  Pulse: 76 71 71 71  Resp: 18  23 24   Temp:   99.1 F (37.3 C)   TempSrc:   Oral   SpO2: 94% 94% 94% 94%  Weight:      Height:          Constitutional: NAD, calm, ill-appearing, pale Vitals:   08/15/16 1806 08/15/16 1845 08/15/16 1856 08/15/16 1900  BP: 110/66  (!) 113/50 (!) 127/51  Pulse: 76 71 71 71  Resp: 18 23 24    Temp:   99.1 F (37.3 C)   TempSrc:   Oral   SpO2: 94% 94% 94% 94%  Weight:      Height:       Eyes: PERRL, lids and conjunctivae normal ENMT: Mucous membranes are moist. Posterior pharynx clear of any exudate or lesions. Normal dentition.  Neck: normal appearance, supple Respiratory: Bilateral crackles that clear with deep inspiration.  Intermittent wheezing.  Normal respiratory effort. No accessory muscle use.  Cardiovascular: Normal rate, regular rhythm, no murmurs / rubs / gallops. No extremity edema. 2+ pedal pulses. GI: abdomen is soft and compressible.  No distention.  No tenderness.  Bowel sounds are present. Musculoskeletal:  No joint deformity in upper and lower extremities. Good ROM, no contractures. Normal muscle tone.  Skin: no rashes, pale, skin is dry Neurologic: CN 2-12 grossly intact. Sensation intact.  Hard of hearing.  Generalized weakness. Psychiatric: Normal judgment and insight. Alert and oriented x 3. Normal mood.     Labs on Admission: I have personally reviewed following labs and imaging studies  CBC:  Recent Labs Lab 08/15/16 1727  WBC 5.1    NEUTROABS 4.4  HGB 10.0*  HCT 28.9*  MCV 88.1  PLT 0000000   Basic Metabolic Panel:  Recent Labs Lab 08/15/16 1727  NA 136  K 3.8  CL 102  CO2 23  GLUCOSE 107*  BUN 19  CREATININE 1.14*  CALCIUM 8.6*   GFR: Estimated Creatinine Clearance: 49.4 mL/min (by C-G formula based on SCr of 1.14 mg/dL (H)). Liver Function Tests:  Recent Labs Lab 08/15/16 1727  AST 28  ALT 19  ALKPHOS 70  BILITOT 1.3*  PROT 7.0  ALBUMIN 3.7   Coagulation Profile:  Recent Labs Lab 08/15/16 1855  INR 1.39   Urine analysis:    Component Value Date/Time   COLORURINE YELLOW 05/21/2015 1920   APPEARANCEUR CLEAR 05/21/2015 1920   LABSPEC 1.008 05/21/2015 1920   PHURINE 6.0 05/21/2015 1920   GLUCOSEU NEGATIVE 05/21/2015 1920   HGBUR TRACE (A) 05/21/2015 1920   BILIRUBINUR NEGATIVE 05/21/2015 1920   BILIRUBINUR neg 04/20/2013 1728   KETONESUR NEGATIVE 05/21/2015 1920   PROTEINUR NEGATIVE 05/21/2015 1920   UROBILINOGEN 0.2 05/21/2015 1920   NITRITE NEGATIVE 05/21/2015 1920   LEUKOCYTESUR NEGATIVE 05/21/2015 1920   Sepsis Labs:  Lactic acid level 0.78  Radiological Exams on Admission: Dg Chest 2 View  Result Date: 08/15/2016 CLINICAL DATA:  Congestive cough with weakness and shortness of Breath starting this morning. EXAM: CHEST  2 VIEW COMPARISON:  02/14/2016 FINDINGS: AP and lateral views of the chest show low volumes. The cardio pericardial silhouette is enlarged. There is pulmonary vascular congestion without overt pulmonary edema. Interstitial markings are diffusely coarsened with chronic features. Right infrahilar airspace disease may be atelectatic although pneumonia could have this appearance. The visualized bony structures of the thorax are intact. Telemetry leads overlie the chest. IMPRESSION: Cardiomegaly with vascular congestion and underlying chronic interstitial changes. Right infrahilar airspace disease.  Atelectasis versus pneumonia. Electronically Signed   By: Misty Stanley  M.D.   On: 08/15/2016 18:11    EKG: Independently reviewed. NSR.  RBBB.  Assessment/Plan Principal Problem:   Flu-like symptoms Active Problems:   HTN (hypertension)   Fever   History of lymphoma   Influenza   Dehydration   Hypoxia      Flu-like symptoms with acute hypoxic respiratory failure and fever --Oxygen as needed to keep O2 sats greater than 92%; wean as tolerated.  Plan to check ambulatory O2 sat in the AM on room air. --Duonebs prn --Gauifenesin prn --Empiric tamiflu, will discontinue if influenza screen is negative. --NS at 100cc/hr --U/A still pending because patient has been unable to provide a sample --Check orthostatic vital signs --HOLD home dose of lasix for now  HTN --Now with relative hypotension, holding lasix.  Monitor  Dehydration --Maintenance fluids --Repeat BMP in the Am --Monitor urine output  History of lymphoma --She is not undergoing active treatment.  DVT prophylaxis: SCDs Code Status: FULL Family Communication: Daughter present at bedside. Disposition Plan: To be deteremined. Consults called: NONE Admission status: Inpatient, med surg.  Patient has new oxygen requirement and significant debility.  With underlying asthma, she is high risk for decompensation.  I expect she will need inpatient services for greater than two midnights.   TIME SPENT: 60 minutes   Eber Jones MD [facility] Pager [phone]  If 7PM-7AM, please contact night-coverage [email or web address] Password [email or web address]  08/15/2016, 8:12 PM

## 2016-08-15 NOTE — ED Notes (Signed)
Patient unable to void 

## 2016-08-15 NOTE — ED Provider Notes (Signed)
[facility] DEPT Provider Note   CSN: [id number] Arrival date & time: 08/15/16  1652     History   Chief Complaint Chief Complaint  Patient presents with  . Fever  . Fatigue    HPI Natasha Ellis is a 83 y.o. female.  HPI Per EMS pt complaint of fever and fatigue. Pt treated for sinus infection week before Christmas and did not complete antibiotics related to diarrhea. Pt denies n/v/d or dizziness at present time. Pt took 1 gram of  Tylenol at 1600. Past Medical History:  Diagnosis Date  . Arthritis   . Asthma   . Cancer ([place])    Lymphoma  . Cataract   . Chronic cough   . Diverticulosis of intestine    H/O  . DJD (degenerative joint disease) of lumbar spine   . Hypercholesteremia   . Hypertension    pt states I do not have high blood pressure  . IBS (irritable bowel syndrome)   . Left knee DJD   . 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 Right knee DJD   . Sleep apnea     Patient Active Problem List   Diagnosis Date Noted  . Influenza 08/15/2016  . Severe obesity (BMI >= 40) ([place]) 07/30/2015  . Recurrent pneumonia 06/19/2015  . Anemia 06/10/2015  . Anemia due to other cause 06/10/2015  . Fever 09/20/2014  . OSA (obstructive sleep apnea) 09/20/2014  . History of lymphoma 09/20/2014  . History of TIA (transient ischemic attack) 09/20/2014  . Diarrhea   . Nausea with vomiting 08/15/2014  . Pain in gums 08/15/2014  . Neutropenia ([place]) 08/15/2014  . Hypersensitivity reaction 05/31/2014  . Waldenstrom's macroglobulinemia ([place]) 04/19/2014  . HTN (hypertension) 04/29/2013  . HLD (hyperlipidemia) 04/29/2013  . Lacunar infarction ([place]) 04/29/2013  . GERD (gastroesophageal reflux disease) 04/29/2013  . TIA (transient ischemic attack) 04/29/2013  . Back pain 04/29/2013  . Asthma 04/29/2013  . Dyspnea 01/25/2013  . Chronic cough 01/01/2013    Past Surgical History:  Procedure Laterality Date  . ABDOMINAL HYSTERECTOMY  1984   TAH  . ABDOMINAL  HYSTERECTOMY    . EYE SURGERY Bilateral    with lens replacement  . EYE SURGERY Left    retnia  . KNEE ARTHROSCOPY Left   . NASAL SINUS SURGERY N/A 12/04/2015   Procedure: ENDOSCOPIC SINUS SURGERY;  Surgeon: Izora Gala, MD;  Location: [place];  Service: ENT;  Laterality: N/A;  . TEE WITHOUT CARDIOVERSION N/A 04/14/2016   Procedure: TRANSESOPHAGEAL ECHOCARDIOGRAM (TEE);  Surgeon: Lelon Perla, MD;  Location: [facility] ENDOSCOPY;  Service: Cardiovascular;  Laterality: N/A;  . TONSILLECTOMY AND ADENOIDECTOMY  childhood    OB History    Gravida Para Term Preterm AB Living   0 0 0 0 0     SAB TAB Ectopic Multiple Live Births   0 0 0           Home Medications    Prior to Admission medications   Medication Sig Start Date End Date Taking? Authorizing Provider  acetaminophen (TYLENOL) 500 MG tablet Take 500 mg by mouth every 6 (six) hours as needed for pain.    Historical Provider, MD  albuterol (PROAIR HFA) 108 (90 Base) MCG/ACT inhaler Inhale 2 puffs into the lungs every 6 (six) hours as needed for wheezing or shortness of breath. 02/14/16   Tammy S Parrett, NP  albuterol (PROVENTIL) (2.5 MG/3ML) 0.083% nebulizer solution Take 3 mLs (2.5 mg total) by nebulization every 4 (four) hours  as needed for wheezing or shortness of breath. 09/22/14   Theodis Blaze, MD  alendronate (FOSAMAX) 70 MG tablet Take 70 mg by mouth once a week. Sundays 10/14/14   Historical Provider, MD  b complex vitamins capsule Take 1 capsule by mouth daily.    Historical Provider, MD  calcium gluconate 500 MG tablet Take 1 tablet by mouth daily.    Historical Provider, MD  cholestyramine Lucrezia Starch) 4 GM/DOSE powder Take 4 g by mouth daily as needed. Reported on 01/24/2016    Historical Provider, MD  clopidogrel (PLAVIX) 75 MG tablet  02/03/16   Historical Provider, MD  dextromethorphan (DELSYM) 30 MG/5ML liquid Take 15 mg by mouth as needed for cough.    Historical Provider, MD  ferrous sulfate 325 (65 FE) MG tablet Take 325 mg by  mouth daily.     Historical Provider, MD  furosemide (LASIX) 20 MG tablet Take 1 tablet (20 mg total) by mouth daily. 03/30/13   Brand Males, MD  guaifenesin (ROBITUSSIN) 100 MG/5ML syrup Take 10 mLs (200 mg total) by mouth every 4 (four) hours as needed for congestion. 09/22/14   Theodis Blaze, MD  ibuprofen (ADVIL,MOTRIN) 200 MG tablet Take 200 mg by mouth every 6 (six) hours as needed for pain.    Historical Provider, MD  loperamide (IMODIUM) 2 MG capsule Take 1 capsule (2 mg total) by mouth 4 (four) times daily as needed for diarrhea or loose stools. 05/01/13   Eugenie Filler, MD  Multiple Vitamin (MULTIVITAMIN) capsule Take 1 capsule by mouth daily. With Vitamin D 4000    Historical Provider, MD  potassium chloride (K-DUR) 10 MEQ tablet Take 10 mEq by mouth daily.    Historical Provider, MD  promethazine (PHENERGAN) 25 MG suppository Place 1 suppository (25 mg total) rectally every 6 (six) hours as needed for nausea or vomiting. 12/04/15   Izora Gala, MD    Family History Family History  Problem Relation Age of Onset  . Stroke Father   . Heart failure Mother 100  . Breast cancer Sister     Social History Social History  Substance Use Topics  . Smoking status: Former Smoker    Packs/day: 1.00    Years: 25.00    Types: Cigarettes    Quit date: 08/04/1979  . Smokeless tobacco: Never Used  . Alcohol use No     Allergies   Codeine; Amoxicillin; Cefdinir; Doxycycline; Erythromycin; Lisinopril; Lisinopril; Oxybutynin; Sulfa antibiotics; and Sulfacetamide sodium   Review of Systems Review of Systems  Constitutional: Positive for fatigue and fever.  Respiratory: Positive for cough.   Gastrointestinal: Negative for abdominal pain.  Genitourinary: Negative for dysuria and urgency.  All other systems reviewed and are negative.    Physical Exam Updated Vital Signs BP (!) 127/51   Pulse 71   Temp 99.1 F (37.3 C) (Oral)   Resp 24   Ht 5\' 6"  (1.676 m)   Wt 265 lb (120.2  kg)   SpO2 94%   BMI 42.77 kg/m   Physical Exam  Constitutional: She is oriented to person, place, and time. She appears well-developed and well-nourished. No distress.  HENT:  Head: Normocephalic and atraumatic.  Eyes: Pupils are equal, round, and reactive to light.  Neck: Normal range of motion.  Cardiovascular: Normal rate and intact distal pulses.   Pulmonary/Chest: No respiratory distress. She has wheezes.  Abdominal: Soft. Normal appearance and bowel sounds are normal.  Musculoskeletal: Normal range of motion.  Neurological: She is alert  and oriented to person, place, and time. No cranial nerve deficit.  Skin: Skin is warm and dry. No rash noted.  Psychiatric: She has a normal mood and affect. Her behavior is normal.  Nursing note and vitals reviewed.    ED Treatments / Results  Labs (all labs ordered are listed, but only abnormal results are displayed) Labs Reviewed  COMPREHENSIVE METABOLIC PANEL - Abnormal; Notable for the following:       Result Value   Glucose, Bld 107 (*)    Creatinine, Ser 1.14 (*)    Calcium 8.6 (*)    Total Bilirubin 1.3 (*)    GFR calc non Af Amer 43 (*)    GFR calc Af Amer 50 (*)    All other components within normal limits  CBC WITH DIFFERENTIAL/PLATELET - Abnormal; Notable for the following:    RBC 3.28 (*)    Hemoglobin 10.0 (*)    HCT 28.9 (*)    RDW 15.8 (*)    Lymphs Abs 0.5 (*)    All other components within normal limits  PROTIME-INR - Abnormal; Notable for the following:    Prothrombin Time 17.2 (*)    All other components within normal limits  CULTURE, BLOOD (ROUTINE X 2)  CULTURE, BLOOD (ROUTINE X 2)  URINE CULTURE  URINALYSIS, ROUTINE W REFLEX MICROSCOPIC  INFLUENZA PANEL BY PCR (TYPE A & B, H1N1)  I-STAT CG4 LACTIC ACID, ED    EKG  EKG Interpretation  Date/Time:  Saturday August 15 2016 17:25:16 EST Ventricular Rate:  77 PR Interval:    QRS Duration: 136 QT Interval:  410 QTC Calculation: 464 R Axis:       Text Interpretation:  Sinus rhythm Right bundle branch block No significant change since last tracing Confirmed by Jakarri Lesko  MD, Retaj Hilbun ([id number]) on 08/15/2016 6:59:02 PM       Radiology Dg Chest 2 View  Result Date: 08/15/2016 CLINICAL DATA:  Congestive cough with weakness and shortness of Breath starting this morning. EXAM: CHEST  2 VIEW COMPARISON:  02/14/2016 FINDINGS: AP and lateral views of the chest show low volumes. The cardio pericardial silhouette is enlarged. There is pulmonary vascular congestion without overt pulmonary edema. Interstitial markings are diffusely coarsened with chronic features. Right infrahilar airspace disease may be atelectatic although pneumonia could have this appearance. The visualized bony structures of the thorax are intact. Telemetry leads overlie the chest. IMPRESSION: Cardiomegaly with vascular congestion and underlying chronic interstitial changes. Right infrahilar airspace disease.  Atelectasis versus pneumonia. Electronically Signed   By: Misty Stanley M.D.   On: 08/15/2016 18:11    Procedures Procedures (including critical care time)  Medications Ordered in ED Medications  sodium chloride 0.9 % bolus 500 mL (500 mLs Intravenous New Bag/Given 08/15/16 1931)  sodium chloride 0.9 % bolus 500 mL (not administered)  0.9 %  sodium chloride infusion (not administered)     Initial Impression / Assessment and Plan / ED Course  I have reviewed the triage vital signs and the nursing notes.  Pertinent labs & imaging results that were available during my care of the patient were reviewed by me and considered in my medical decision making (see chart for details).  Clinical Course       Final Clinical Impressions(s) / ED Diagnoses   Final diagnoses:  Influenza-like illness    New Prescriptions New Prescriptions   No medications on file     Leonard Schwartz, MD 08/15/16 1946

## 2016-08-15 NOTE — ED Triage Notes (Addendum)
Per EMS pt complaint of fever and fatigue. Pt treated for sinus infection week before Christmas and did not complete antibiotics related to diarrhea. Pt denies n/v/d or dizziness at present time. Pt took 1 gram of  Tylenol at 1600.

## 2016-08-16 DIAGNOSIS — J111 Influenza due to unidentified influenza virus with other respiratory manifestations: Principal | ICD-10-CM

## 2016-08-16 DIAGNOSIS — I10 Essential (primary) hypertension: Secondary | ICD-10-CM

## 2016-08-16 DIAGNOSIS — J9601 Acute respiratory failure with hypoxia: Secondary | ICD-10-CM

## 2016-08-16 LAB — CBC
HCT: 28 % — ABNORMAL LOW (ref 36.0–46.0)
Hemoglobin: 9.1 g/dL — ABNORMAL LOW (ref 12.0–15.0)
MCH: 28.3 pg (ref 26.0–34.0)
MCHC: 32.5 g/dL (ref 30.0–36.0)
MCV: 87 fL (ref 78.0–100.0)
Platelets: 138 10*3/uL — ABNORMAL LOW (ref 150–400)
RBC: 3.22 MIL/uL — ABNORMAL LOW (ref 3.87–5.11)
RDW: 15.9 % — ABNORMAL HIGH (ref 11.5–15.5)
WBC: 2.8 10*3/uL — ABNORMAL LOW (ref 4.0–10.5)

## 2016-08-16 LAB — BASIC METABOLIC PANEL
Anion gap: 8 (ref 5–15)
BUN: 23 mg/dL — ABNORMAL HIGH (ref 6–20)
CO2: 24 mmol/L (ref 22–32)
Calcium: 8 mg/dL — ABNORMAL LOW (ref 8.9–10.3)
Chloride: 106 mmol/L (ref 101–111)
Creatinine, Ser: 1.08 mg/dL — ABNORMAL HIGH (ref 0.44–1.00)
GFR calc Af Amer: 53 mL/min — ABNORMAL LOW (ref >60)
GFR calc non Af Amer: 46 mL/min — ABNORMAL LOW (ref >60)
Glucose, Bld: 96 mg/dL (ref 65–99)
Potassium: 3.6 mmol/L (ref 3.5–5.1)
Sodium: 138 mmol/L (ref 135–145)

## 2016-08-16 MED ORDER — BENZONATATE 100 MG PO CAPS
100.0000 mg | ORAL_CAPSULE | Freq: Three times a day (TID) | ORAL | Status: DC | PRN
  Administered 2016-08-16 – 2016-08-17 (×3): 100 mg via ORAL
  Filled 2016-08-16 (×3): qty 1

## 2016-08-16 MED ORDER — GUAIFENESIN 100 MG/5ML PO SYRP
200.0000 mg | ORAL_SOLUTION | ORAL | Status: DC | PRN
  Administered 2016-08-17 – 2016-08-18 (×2): 200 mg via ORAL
  Filled 2016-08-16 (×3): qty 10

## 2016-08-16 MED ORDER — OFLOXACIN 0.3 % OP SOLN
1.0000 [drp] | Freq: Four times a day (QID) | OPHTHALMIC | Status: DC
  Administered 2016-08-16 – 2016-08-18 (×7): 1 [drp] via OPHTHALMIC
  Filled 2016-08-16: qty 5

## 2016-08-16 NOTE — Progress Notes (Signed)
Temp 103.2; MD notified; tylenol per prn order. RN will given Tylenol per prn order. Blair Hailey, RN

## 2016-08-16 NOTE — Progress Notes (Signed)
PROGRESS NOTE  ARBOR STORMONT  [id number] DOB: 27-Feb-1931 DOA: 08/15/2016 PCP: Irven Shelling, MD  Outpatient Specialists: Dr. Elsworth Soho, pulmonology Dr. [name], ENT Dr. Benay Spice, oncology  Brief Narrative: Natasha Ellis is a 83 y.o. woman with a history of lymphoma, HTN, asthma, and chronic cough who has been feeling ill for the past two days.  She saw her PCP for a previously scheduled routine check-up on 1/9 when she did not have fever at that time, supportive care was recommended. In the next 24 hours, she developed fever to 104F, increased dyspnea on exertion, weakness, and confusion per her daughter. Nurse evaluation at ALF revealed mild hypoxia (90% without oxygen), so she presented to the ED. She was in no distress, but required oxygen. CXR showed cardiomegaly with vascular congestion and right infrahilar airspace disease (atelectasis vs. infiltrate). WBC and lactate were normal. She was admitted for ongoing work up and treatment, started on empiric tamiflu. Influenza swab was positive.   Assessment & Plan: Principal Problem:   Flu-like symptoms Active Problems:   HTN (hypertension)   Fever   History of lymphoma   Influenza   Dehydration   Hypoxia  Acute hypoxic respiratory failure due to influenza: - Continue oxygen prn SpO2 >92% - Continue tamiflu (1/13 > 1/17) - Continue bronchodilators - Continue guaifenesin, tessalon prn  HTN: Chronic, stable. Now with relative hypotension. Echo 04/14/2016 with EF 55-60%, normal wall motion and no vegetations (performed for CVA) - Holding lasix.  Monitor  Dehydration: Due to poor po and now with some soft stools since illness onset.  - Due to pulmonary vascular congestion on CXR and crackles on exam, will stop IV fluids.  - Monitor UOP  History of CVA: No acute symptoms. 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  - Dr.  Benay Spice added to treatment team as FYI  Bacterial conjunctivitis: Began treatment per ophthalmologist 3 days ago and symptoms have largely resolved. Exam benign at this time.  - Continue ofloxacin gtt's per outpatient order QID.  DVT prophylaxis: SCDs Code Status: Full Family Communication: None at bedside this AM. Disposition Plan: Continue inpatient management, anticipate DC back to previous home/ALF environment  Consultants:   None  Procedures:   None  Antimicrobials:  Tamiflu 1/13 - 1/17   Subjective: Pt feels weak diffusely, with trouble breathing mostly on exertion. No chest pain or focal neurological deficits. Had 2 soft stools this morning, not watery. Left eye pain improving, no redness or vision changes.   Objective: Vitals:   08/15/16 1856 08/15/16 1900 08/15/16 2033 08/16/16 0646  BP: (!) 113/50 (!) 127/51 (!) 125/43 (!) 99/50  Pulse: 71 71 77 80  Resp: 24  20 (!) 21  Temp: 99.1 F (37.3 C)  98.5 F (36.9 C) 98.5 F (36.9 C)  TempSrc: Oral  Oral Oral  SpO2: 94% 94% 97%   Weight:      Height:        Intake/Output Summary (Last 24 hours) at 08/16/16 1210 Last data filed at 08/16/16 0500  Gross per 24 hour  Intake              800 ml  Output              400 ml  Net              400 ml   Filed Weights   08/15/16 1718  Weight: 120.2 kg (265 lb)  Examination: General exam: Pleasant elderly female in no distress  HEENT: Left eye with mild ptosis corrects with effort, mild conjunctival injection. PERRL and clear anterior chamber. Visual acuity grossly preserved without photophobia. Respiratory system: Mildly labored on 2.5L by [place]. Diffuse rhonchi and trace bibasilar crackles. Cardiovascular system: Regular rate and rhythm. No murmur, rub, or gallop. No JVD, and trace pedal edema. Gastrointestinal system: Abdomen soft, non-tender, non-distended, with normoactive bowel sounds. No organomegaly or masses felt. Central nervous system: Alert and oriented.  No focal neurological deficits. Extremities: Warm, no deformities Skin: No rashes, lesions no ulcers Psychiatry: Judgement and insight appear normal. Mood & affect appropriate.   Data Reviewed: I have personally reviewed following labs and imaging studies  CBC:  Recent Labs Lab 08/15/16 1727 08/16/16 0554  WBC 5.1 2.8*  NEUTROABS 4.4  --   HGB 10.0* 9.1*  HCT 28.9* 28.0*  MCV 88.1 87.0  PLT 178 0000000*   Basic Metabolic Panel:  Recent Labs Lab 08/15/16 1727 08/16/16 0554  NA 136 138  K 3.8 3.6  CL 102 106  CO2 23 24  GLUCOSE 107* 96  BUN 19 23*  CREATININE 1.14* 1.08*  CALCIUM 8.6* 8.0*   GFR: Estimated Creatinine Clearance: 52.2 mL/min (by C-G formula based on SCr of 1.08 mg/dL (H)). Liver Function Tests:  Recent Labs Lab 08/15/16 1727  AST 28  ALT 19  ALKPHOS 70  BILITOT 1.3*  PROT 7.0  ALBUMIN 3.7   No results for input(s): LIPASE, AMYLASE in the last 168 hours. No results for input(s): AMMONIA in the last 168 hours. Coagulation Profile:  Recent Labs Lab 08/15/16 1855  INR 1.39   Cardiac Enzymes: No results for input(s): CKTOTAL, CKMB, CKMBINDEX, TROPONINI in the last 168 hours. BNP (last 3 results) No results for input(s): PROBNP in the last 8760 hours. HbA1C: No results for input(s): HGBA1C in the last 72 hours. CBG: No results for input(s): GLUCAP in the last 168 hours. Lipid Profile: No results for input(s): CHOL, HDL, LDLCALC, TRIG, CHOLHDL, LDLDIRECT in the last 72 hours. Thyroid Function Tests: No results for input(s): TSH, T4TOTAL, FREET4, T3FREE, THYROIDAB in the last 72 hours. Anemia Panel: No results for input(s): VITAMINB12, FOLATE, FERRITIN, TIBC, IRON, RETICCTPCT in the last 72 hours. Urine analysis:    Component Value Date/Time   COLORURINE YELLOW 08/15/2016 2144   APPEARANCEUR HAZY (A) 08/15/2016 2144   LABSPEC 1.015 08/15/2016 2144   PHURINE 6.0 08/15/2016 2144   GLUCOSEU NEGATIVE 08/15/2016 2144   HGBUR SMALL (A)  08/15/2016 2144   BILIRUBINUR NEGATIVE 08/15/2016 2144   BILIRUBINUR neg 04/20/2013 [place] 08/15/2016 2144   PROTEINUR 100 (A) 08/15/2016 2144   UROBILINOGEN 0.2 05/21/2015 1920   NITRITE NEGATIVE 08/15/2016 2144   LEUKOCYTESUR TRACE (A) 08/15/2016 2144   Sepsis Labs: @LABRCNTIP (procalcitonin:4,lacticidven:4)  ) Recent Results (from the past 240 hour(s))  Culture, blood (Routine x 2)     Status: None (Preliminary result)   Collection Time: 08/15/16  5:27 PM  Result Value Ref Range Status   Specimen Description BLOOD RIGHT HAND  Final   Special Requests BOTTLES DRAWN AEROBIC AND ANAEROBIC 5CC  Final   Culture   Final    NO GROWTH < 24 HOURS Performed at [facility]    Report Status PENDING  Incomplete  Culture, blood (Routine x 2)     Status: None (Preliminary result)   Collection Time: 08/15/16  5:34 PM  Result Value Ref Range Status   Specimen Description  BLOOD BLOOD LEFT FOREARM  Final   Special Requests BOTTLES DRAWN AEROBIC AND ANAEROBIC 5CC  Final   Culture   Final    NO GROWTH < 24 HOURS Performed at [facility]    Report Status PENDING  Incomplete     Radiology Studies: Dg Chest 2 View  Result Date: 08/15/2016 CLINICAL DATA:  Congestive cough with weakness and shortness of Breath starting this morning. EXAM: CHEST  2 VIEW COMPARISON:  02/14/2016 FINDINGS: AP and lateral views of the chest show low volumes. The cardio pericardial silhouette is enlarged. There is pulmonary vascular congestion without overt pulmonary edema. Interstitial markings are diffusely coarsened with chronic features. Right infrahilar airspace disease may be atelectatic although pneumonia could have this appearance. The visualized bony structures of the thorax are intact. Telemetry leads overlie the chest. IMPRESSION: Cardiomegaly with vascular congestion and underlying chronic interstitial changes. Right infrahilar airspace disease.  Atelectasis versus pneumonia.  Electronically Signed   By: Misty Stanley M.D.   On: 08/15/2016 18:11    Scheduled Meds: . clopidogrel  75 mg Oral Daily  . oseltamivir  75 mg Oral BID  . sodium chloride  500 mL Intravenous Once   Continuous Infusions:   LOS: 1 day   Time spent: 25 minutes.  Vance Gather, MD [facility] Pager [phone]  If 7PM-7AM, please contact night-coverage [email or web address] Password [email or web address] 08/16/2016, 12:10 PM

## 2016-08-17 LAB — URINE CULTURE

## 2016-08-17 LAB — GLUCOSE, CAPILLARY: Glucose-Capillary: 110 mg/dL — ABNORMAL HIGH (ref 65–99)

## 2016-08-17 LAB — C DIFFICILE QUICK SCREEN W PCR REFLEX
C Diff antigen: NEGATIVE
C Diff interpretation: NOT DETECTED
C Diff toxin: NEGATIVE

## 2016-08-17 MED ORDER — CHLORHEXIDINE GLUCONATE 0.12 % MT SOLN
15.0000 mL | Freq: Four times a day (QID) | OROMUCOSAL | Status: DC
  Administered 2016-08-17 – 2016-08-18 (×4): 15 mL via OROMUCOSAL
  Filled 2016-08-17 (×3): qty 15

## 2016-08-17 MED ORDER — DIPHENOXYLATE-ATROPINE 2.5-0.025 MG PO TABS
2.0000 | ORAL_TABLET | Freq: Once | ORAL | Status: AC
  Administered 2016-08-17: 2 via ORAL
  Filled 2016-08-17 (×2): qty 2

## 2016-08-17 NOTE — Progress Notes (Signed)
PROGRESS NOTE  Natasha Ellis  [id number] DOB: 02-11-1931 DOA: 08/15/2016 PCP: Irven Shelling, MD  Outpatient Specialists: Dr. Elsworth Soho, pulmonology Dr. [name], ENT Dr. Benay Spice, oncology  Brief Narrative: Natasha Ellis is a 83 y.o. woman with a history of lymphoma, HTN, asthma, and chronic cough who has been feeling ill for the past two days.  She saw her PCP for a previously scheduled routine check-up on 1/9 when she did not have fever at that time, supportive care was recommended. In the next 24 hours, she developed fever to 104F, increased dyspnea on exertion, weakness, and confusion per her daughter. Nurse evaluation at ALF revealed mild hypoxia (90% without oxygen), so she presented to the ED. She was in no distress, but required oxygen. CXR showed cardiomegaly with vascular congestion and right infrahilar airspace disease (atelectasis vs. infiltrate). WBC and lactate were normal. She was admitted for ongoing work up and treatment, started on empiric tamiflu. Influenza swab was positive. She was weaned to room air 1/15 but still felt weak.    Assessment & Plan: Principal Problem:   Influenza with respiratory manifestation Active Problems:   HTN (hypertension)   Fever   History of lymphoma   Diarrhea   Influenza   Dehydration   Hypoxia  Acute hypoxic respiratory failure due to influenza: Hypoxemia resolved 1/15. - Continue tamiflu (1/13 > 1/17) - Continue bronchodilators - Continue guaifenesin, tessalon prn  HTN: Chronic, stable. Now with relative hypotension. Echo 04/14/2016 with EF 55-60%, normal wall motion and no vegetations (performed for CVA) - Holding lasix.  Monitor  Dehydration: Due to poor po and now with some soft stools since illness onset.  - Due to pulmonary vascular congestion on CXR and crackles on exam, will stop IV fluids.  - Monitor UOP  Watery stools: x > 3 over past 24 hours.  - Continue IVF and check CDiff.  History of CVA: No acute symptoms. 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  - Dr. Benay Spice added to treatment team as FYI  Bacterial conjunctivitis: Began treatment per ophthalmologist 3 days ago and symptoms have largely resolved. Exam benign at this time.  - Continue ofloxacin gtt's per outpatient order QID.  DVT prophylaxis: SCDs Code Status: Full Family Communication: None at bedside this AM. Disposition Plan: Continue inpatient management, anticipate DC back to previous home/ALF environment, likely in 24 hours, pending therapy evaluation  Consultants:   None  Procedures:   None  Antimicrobials:  Tamiflu 1/13 - 1/17   Subjective: Pt feels she's regaining strength. Weaned off oxygen this morning, walking to bathroom. Stools have turned to black water twice so far this AM. Says roof of mouth feels weird, not painful. No dysphagia or odynophagia. Ate all of breakfast. High fever yesterday afternoon.   Objective: Vitals:   08/16/16 0646 08/16/16 1628 08/16/16 2054 08/17/16 0500  BP: (!) 99/50 (!) 151/66 (!) 113/51 137/72  Pulse: 80 81 70 62  Resp: (!) 21 18 20 19   Temp: 98.5 F (36.9 C) (!) 103.2 F (39.6 C) 99.5 F (37.5 C) 98.6 F (37 C)  TempSrc: Oral Oral Oral Oral  SpO2:  99% 94% 98%  Weight:      Height:        Intake/Output Summary (Last 24 hours) at 08/17/16 1022 Last data filed at 08/17/16 0757  Gross per 24 hour  Intake              240 ml  Output              650 ml  Net             -410 ml   Filed Weights   08/15/16 1718  Weight: 120.2 kg (265 lb)    Examination: General exam: Pleasant elderly female in no distress  HEENT: Left eye with mild ptosis corrects with effort, mild conjunctival injection. Periorbital edema noted. PERRL and clear anterior chamber. Visual acuity grossly preserved without photophobia. No evidence of thrush or ulcers in mouth/throat.  Respiratory  system: Mildly labored on 2.5L by [place]. Diffuse rhonchi and trace bibasilar crackles. Cardiovascular system: Regular rate and rhythm. No murmur, rub, or gallop. No JVD, and trace pedal edema. Gastrointestinal system: Abdomen soft, non-tender, non-distended, with normoactive bowel sounds. No organomegaly or masses felt. Central nervous system: Alert and oriented. No focal neurological deficits. Extremities: Warm, no deformities Skin: No rashes, lesions no ulcers Psychiatry: Judgement and insight appear normal. Mood & affect appropriate.   Data Reviewed: I have personally reviewed following labs and imaging studies  CBC:  Recent Labs Lab 08/15/16 1727 08/16/16 0554  WBC 5.1 2.8*  NEUTROABS 4.4  --   HGB 10.0* 9.1*  HCT 28.9* 28.0*  MCV 88.1 87.0  PLT 178 0000000*   Basic Metabolic Panel:  Recent Labs Lab 08/15/16 1727 08/16/16 0554  NA 136 138  K 3.8 3.6  CL 102 106  CO2 23 24  GLUCOSE 107* 96  BUN 19 23*  CREATININE 1.14* 1.08*  CALCIUM 8.6* 8.0*   GFR: Estimated Creatinine Clearance: 52.2 mL/min (by C-G formula based on SCr of 1.08 mg/dL (H)). Liver Function Tests:  Recent Labs Lab 08/15/16 1727  AST 28  ALT 19  ALKPHOS 70  BILITOT 1.3*  PROT 7.0  ALBUMIN 3.7   Coagulation Profile:  Recent Labs Lab 08/15/16 1855  INR 1.39   Urine analysis:    Component Value Date/Time   COLORURINE YELLOW 08/15/2016 2144   APPEARANCEUR HAZY (A) 08/15/2016 2144   LABSPEC 1.015 08/15/2016 2144   PHURINE 6.0 08/15/2016 2144   GLUCOSEU NEGATIVE 08/15/2016 2144   HGBUR SMALL (A) 08/15/2016 2144   BILIRUBINUR NEGATIVE 08/15/2016 2144   BILIRUBINUR neg 04/20/2013 [place] 08/15/2016 2144   PROTEINUR 100 (A) 08/15/2016 2144   UROBILINOGEN 0.2 05/21/2015 1920   NITRITE NEGATIVE 08/15/2016 2144   LEUKOCYTESUR TRACE (A) 08/15/2016 2144   Sepsis Labs: @LABRCNTIP (procalcitonin:4,lacticidven:4)  ) Recent Results (from the past 240 hour(s))  Culture, blood  (Routine x 2)     Status: None (Preliminary result)   Collection Time: 08/15/16  5:27 PM  Result Value Ref Range Status   Specimen Description BLOOD RIGHT HAND  Final   Special Requests BOTTLES DRAWN AEROBIC AND ANAEROBIC 5CC  Final   Culture   Final    NO GROWTH < 24 HOURS Performed at [facility]    Report Status PENDING  Incomplete  Culture, blood (Routine x 2)     Status: None (Preliminary result)   Collection Time: 08/15/16  5:34 PM  Result Value Ref Range Status   Specimen Description BLOOD BLOOD LEFT FOREARM  Final   Special Requests BOTTLES DRAWN AEROBIC AND ANAEROBIC 5CC  Final   Culture   Final    NO GROWTH < 24 HOURS Performed at [facility]    Report Status PENDING  Incomplete  Urine culture     Status: Abnormal   Collection Time: 08/15/16  9:44 PM  Result Value Ref Range Status   Specimen Description URINE, RANDOM  Final   Special Requests NONE  Final   Culture MULTIPLE SPECIES PRESENT, SUGGEST RECOLLECTION (A)  Final   Report Status 08/17/2016 FINAL  Final     Radiology Studies: Dg Chest 2 View  Result Date: 08/15/2016 CLINICAL DATA:  Congestive cough with weakness and shortness of Breath starting this morning. EXAM: CHEST  2 VIEW COMPARISON:  02/14/2016 FINDINGS: AP and lateral views of the chest show low volumes. The cardio pericardial silhouette is enlarged. There is pulmonary vascular congestion without overt pulmonary edema. Interstitial markings are diffusely coarsened with chronic features. Right infrahilar airspace disease may be atelectatic although pneumonia could have this appearance. The visualized bony structures of the thorax are intact. Telemetry leads overlie the chest. IMPRESSION: Cardiomegaly with vascular congestion and underlying chronic interstitial changes. Right infrahilar airspace disease.  Atelectasis versus pneumonia. Electronically Signed   By: Misty Stanley M.D.   On: 08/15/2016 18:11    Scheduled Meds: . clopidogrel  75 mg  Oral Daily  . ofloxacin  1 drop Both Eyes QID  . oseltamivir  75 mg Oral BID   Continuous Infusions:   LOS: 2 days   Time spent: 25 minutes.  Vance Gather, MD [facility] Pager [phone]  If 7PM-7AM, please contact night-coverage [email or web address] Password [facility] 08/17/2016, 10:22 AM

## 2016-08-17 NOTE — Clinical Social Work Note (Signed)
Clinical Social Work Assessment  Patient Details  Name: Natasha Ellis MRN: [id number] Date of Birth: 1931/03/14  Date of referral:  08/17/16               Reason for consult:  Facility Placement                Permission sought to share information with:  Family Supports Permission granted to share information::     Name::     [place]  Agency::  [facility]   Relationship::  Daughter   Contact Information:  [phone]  Housing/Transportation Living arrangements for the past 2 months:  Materials engineer, [place] of Information:  Patient Patient Interpreter Needed:  None Criminal Activity/Legal Involvement Pertinent to Current Situation/Hospitalization:  No - Comment as needed Significant Relationships:  Adult Children, Spouse Lives with:  Spouse Do you feel safe going back to the place where you live?  Yes Need for family participation in patient care:  Yes (Comment)  Care giving concerns:  Patient does not present concerns, [facility]    Social Worker assessment / plan:  LCSWA met with patient-sitting in chair, alert and oriented x4.  Patient reports she has been living in independent living for four years. She reports she would like to return with PT. She reports for the last two months she has been working with PT, private pay through [employer]. She reports she also has individuals come into her home and assist with bathing.  Patient is independently with most ADL's.  She reports her daughter is very involved in her care, and plans to transport her home when ready for discharge.  Facility confirmed patient is from independent living, patient wants to return with HHPT if needed.    Employment status:  Retired Forensic scientist:  Medicare PT Recommendations:  Not assessed at this time Information / Referral to community resources:     Patient/Family's Response to care:  Agreeable and responding  well  to care.   Patient/Family's Understanding of and Emotional Response to Diagnosis, Current Treatment, and Prognosis:  Patient reports daughter is very involved in her care.   Emotional Assessment Appearance:    Attitude/Demeanor/Rapport:    Affect (typically observed):  Calm, Pleasant, Accepting Orientation:  Oriented to Self, Oriented to Place, Oriented to  Time, Oriented to Situation Alcohol / Substance use:  Not Applicable Psych involvement (Current and /or in the community):  No (Comment)  Discharge Needs  Concerns to be addressed:  Care Coordination, Discharge Planning Concerns Readmission within the last 30 days:  No Current discharge risk:  None Barriers to Discharge:  Continued Medical Work up   [employer], [place] 08/17/2016, 10:22 AM

## 2016-08-18 ENCOUNTER — Encounter (HOSPITAL_COMMUNITY): Payer: Self-pay

## 2016-08-18 LAB — BASIC METABOLIC PANEL
Anion gap: 7 (ref 5–15)
BUN: 17 mg/dL (ref 6–20)
CO2: 26 mmol/L (ref 22–32)
Calcium: 8.4 mg/dL — ABNORMAL LOW (ref 8.9–10.3)
Chloride: 106 mmol/L (ref 101–111)
Creatinine, Ser: 0.97 mg/dL (ref 0.44–1.00)
GFR calc Af Amer: >60 mL/min (ref >60)
GFR calc non Af Amer: 53 mL/min — ABNORMAL LOW (ref >60)
Glucose, Bld: 92 mg/dL (ref 65–99)
Potassium: 3.5 mmol/L (ref 3.5–5.1)
Sodium: 139 mmol/L (ref 135–145)

## 2016-08-18 LAB — CBC
HCT: 30.5 % — ABNORMAL LOW (ref 36.0–46.0)
Hemoglobin: 10 g/dL — ABNORMAL LOW (ref 12.0–15.0)
MCH: 27.9 pg (ref 26.0–34.0)
MCHC: 32.8 g/dL (ref 30.0–36.0)
MCV: 85 fL (ref 78.0–100.0)
Platelets: 168 10*3/uL (ref 150–400)
RBC: 3.59 MIL/uL — ABNORMAL LOW (ref 3.87–5.11)
RDW: 15.4 % (ref 11.5–15.5)
WBC: 3.3 10*3/uL — ABNORMAL LOW (ref 4.0–10.5)

## 2016-08-18 MED ORDER — DIPHENOXYLATE-ATROPINE 2.5-0.025 MG PO TABS: 1.0000 | ORAL_TABLET | Freq: Four times a day (QID) | ORAL | 0 refills | Status: DC | PRN

## 2016-08-18 MED ORDER — OSELTAMIVIR PHOSPHATE 75 MG PO CAPS: 75.0000 mg | ORAL_CAPSULE | Freq: Two times a day (BID) | ORAL | 0 refills | Status: DC

## 2016-08-18 NOTE — Evaluation (Signed)
Physical Therapy Evaluation Patient Details Name: Natasha Ellis MRN: [id number] DOB: 11/16/1930 Today's Date: 08/18/2016   History of Present Illness   Natasha Ellis is a 83 y.o. woman with a history of lymphoma, HTN, asthma, and chronic cough who has been feeling ill for the past two days  Clinical Impression  Pt admitted with above diagnosis. Pt currently with functional limitations due to the deficits listed below (see PT Problem List). * Pt will benefit from skilled PT to increase their independence and safety with mobility to allow discharge to the venue listed below.       Follow Up Recommendations Home health PT    Equipment Recommendations  None recommended by PT    Recommendations for Other Services       Precautions / Restrictions Precautions Precautions: Fall      Mobility  Bed Mobility               General bed mobility comments: NT-- OOB   Transfers Overall transfer level: Needs assistance Equipment used: Rolling walker (2 wheeled) Transfers: Sit to/from Stand Sit to Stand: Supervision         General transfer comment: pt uses rocking/momentum to stand  Ambulation/Gait Ambulation/Gait assistance: Min assist;Min guard Ambulation Distance (Feet): 110 Feet Assistive device: Rolling walker (2 wheeled) Gait Pattern/deviations: Step-through pattern     General Gait Details: cues for standing rests d/t DOE; encouraged proper breathing techniques but pt states it is too hard for her; cues for  Trunk extension  Stairs            Wheelchair Mobility    Modified Rankin (Stroke Patients Only)       Balance Overall balance assessment: Needs assistance         Standing balance support: Single extremity supported Standing balance-Leahy Scale: Fair                               Pertinent Vitals/Pain Pain Assessment: No/denies pain    Home Living Family/patient expects to be discharged to:: Private residence Living  Arrangements: Alone   Type of Home: Apartment Home Access: Level entry     Home Layout: One level Home Equipment: Walker - 2 wheels;Cane - single point;Walker - 4 wheels Additional Comments: lives in [place] living at [place]    Prior Function Level of Independence: Independent;Independent with assistive device(s)         Comments: using mostly her cane to amb      Hand Dominance        Extremity/Trunk Assessment   Upper Extremity Assessment Upper Extremity Assessment: Defer to OT evaluation;[facility] for tasks assessed    Lower Extremity Assessment Lower Extremity Assessment: Overall WFL for tasks assessed       Communication      Cognition Arousal/Alertness: Awake/alert Behavior During Therapy: WFL for tasks assessed/performed Overall Cognitive Status: Within Functional Limits for tasks assessed                      General Comments      Exercises     Assessment/Plan    PT Assessment Patient needs continued PT services  PT Problem List Decreased strength;Decreased activity tolerance;Decreased mobility;Decreased knowledge of use of DME;Pain          PT Treatment Interventions DME instruction;Gait training;Functional mobility training;Therapeutic activities;Therapeutic exercise;Patient/family education    PT Goals (Current goals can be found in the Care Plan  section)  Acute Rehab PT Goals Patient Stated Goal: home to [facility] PT Goal Formulation: With patient Time For Goal Achievement: 08/25/16 Potential to Achieve Goals: Good    Frequency Min 3X/week   Barriers to discharge        Co-evaluation               End of Session Equipment Utilized During Treatment: Gait belt Activity Tolerance: Patient tolerated treatment well Patient left: in chair;with call bell/phone within reach;with family/visitor present           Time: 1215-1229 PT Time Calculation (min) (ACUTE ONLY): 14 min   Charges:   PT  Evaluation $PT Eval Low Complexity: 1 Procedure     PT G Codes:        Rony Ratz 2016-09-05, 1:44 PM

## 2016-08-18 NOTE — Discharge Summary (Signed)
Physician Discharge Summary  LAVILLA STEINBRUNNER [id number] DOB: 20-Jan-1931 DOA: 08/15/2016  PCP: Irven Shelling, MD  Admit date: 08/15/2016 Discharge date: 08/18/2016  Admitted From: [facility]  Disposition: [facility]   Recommendations for Outpatient Follow-up:  1. Follow up with PCP in 1-2 weeks 2. Please continue home health physical therapy.   Home Health: Physical therapy to be ordered/provided by [facility] Equipment/Devices: None Discharge Condition: Stable CODE STATUS: Full Diet recommendation: Heart healthy  Brief/Interim Summary: MCKALYN WILLENBERG a 83 y.o.woman with a history of lymphoma, HTN, asthma, and chronic cough who has been feeling ill for the past two days. She saw her PCP for a previously scheduled routine check-up on 1/9 when she did not have fever at that time, supportive care was recommended.In the next 24 hours, she developed fever to 104F, increased dyspnea on exertion, weakness, and confusion per her daughter. Nurse evaluation at ALF revealed mild hypoxia (90% without oxygen), so she presented to the ED. She was in no distress, but required oxygen. CXR showed cardiomegaly with vascular congestion and right infrahilar airspace disease (atelectasis vs. infiltrate). WBC and lactate were normal. She was admitted for ongoing work up and treatment, started on empiric tamiflu. Influenza swab was positive. She was weaned to room air 1/15 but still felt weak and had some diarrhea. CDiff was negative and diarrhea slowed down with lomotil. She is eating well and ambulating without dyspnea or oxygen requirement and will continue the course of tamiflu at home.   Discharge Diagnoses:  Principal Problem:   Influenza with respiratory manifestation Active Problems:   HTN (hypertension)   Fever   History of lymphoma   Diarrhea   Influenza   Dehydration   Hypoxia  Acute hypoxic respiratory failure due to influenza: Hypoxemia resolved 1/15. - Continue tamiflu (1/13 >  1/17) - Continue bronchodilators at home - Continue guaifenesin, tessalon prn at home  HTN: Chronic, stable. Now with relative hypotension. Echo 04/14/2016 with EF 55-60%, normal wall motion and no vegetations (performed for CVA) - Held lasix, ok to restart at discharge. BP normal, euvolemic.  Dehydration: Due to poor po and now with some soft stools since illness onset.  - Due to pulmonary vascular congestion on CXR and crackles on exam, stopped IV fluids.   Watery stools: Thought to be associated with influenza.  - CDiff negative.  - Ok to use lomotil at home. Prescribed.   History of CVA: No acute symptoms. 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  - Dr. Benay Spice was added to treatment team as FYI  Bacterial conjunctivitis: Began treatment per ophthalmologist 3 days ago and symptoms have largely resolved. Exam benign at this time.  - Continued ofloxacin gtt's per outpatient order QID.  Discharge Instructions Discharge Instructions    Discharge instructions    Complete by:  As directed    You were admitted for complications of the flu. You no longer require oxygen and are able to maintain hydration despite some diarrhea which is not itself infectious. You are stable for discharge with the following recommendations:  - Continue taking tamiflu for a total of 5 days. This means you have 4 more doses to be taken twice daily.  - You may take lomotil up to 4 times daily for loose stools. If you are unable to eat/drink enough to stay hydrated, seek medical attention.  - Otherwise, continue all home medications.   It was a pleasure getting to know  you. Take care! - Dr. Bonner Puna     Allergies as of 08/18/2016      Reactions   Codeine Other (See Comments)   Crazy-nightmares   Amoxicillin Diarrhea   Cefdinir Cough   Doxycycline Diarrhea   Erythromycin Other (See Comments)    GI upset   Lisinopril Cough   Lisinopril    unknown   Oxybutynin Other (See Comments)   Dry mouth   Sulfa Antibiotics Other (See Comments)   GI upset   Sulfacetamide Sodium Other (See Comments)   GI upset      Medication List    TAKE these medications   acetaminophen 500 MG tablet Commonly known as:  TYLENOL Take 1,000 mg by mouth every 6 (six) hours as needed for pain.   albuterol (2.5 MG/3ML) 0.083% nebulizer solution Commonly known as:  PROVENTIL Take 3 mLs (2.5 mg total) by nebulization every 4 (four) hours as needed for wheezing or shortness of breath.   albuterol 108 (90 Base) MCG/ACT inhaler Commonly known as:  PROAIR HFA Inhale 2 puffs into the lungs every 6 (six) hours as needed for wheezing or shortness of breath.   alendronate 70 MG tablet Commonly known as:  FOSAMAX Take 70 mg by mouth once a week. Sundays   b complex vitamins capsule Take 1 capsule by mouth daily.   calcium gluconate 500 MG tablet Take 1 tablet by mouth daily.   clopidogrel 75 MG tablet Commonly known as:  PLAVIX Take 75 mg by mouth daily.   DELSYM 30 MG/5ML liquid Generic drug:  dextromethorphan Take 15 mg by mouth 2 (two) times daily as needed for cough.   diphenoxylate-atropine 2.5-0.025 MG tablet Commonly known as:  LOMOTIL Take 1 tablet by mouth 4 (four) times daily as needed for diarrhea or loose stools.   ferrous sulfate 325 (65 FE) MG tablet Take 325 mg by mouth daily.   furosemide 20 MG tablet Commonly known as:  LASIX Take 1 tablet (20 mg total) by mouth daily.   ibuprofen 200 MG tablet Commonly known as:  ADVIL,MOTRIN Take 400 mg by mouth every 6 (six) hours as needed for moderate pain.   ofloxacin 0.3 % ophthalmic solution Commonly known as:  OCUFLOX Place 1 drop into both eyes 4 (four) times daily.   oseltamivir 75 MG capsule Commonly known as:  TAMIFLU Take 1 capsule (75 mg total) by mouth 2 (two) times daily.   potassium chloride 10 MEQ tablet Commonly  known as:  K-DUR Take 10 mEq by mouth daily.   sodium chloride 0.65 % Soln nasal spray Commonly known as:  OCEAN Place 1 spray into both nostrils 3 (three) times daily as needed for congestion.      Follow-up Information    Irven Shelling, MD. Schedule an appointment as soon as possible for a visit.   Specialty:  Internal Medicine Contact information: [address] [place] [address] [phone]          Allergies  Allergen Reactions  . Codeine Other (See Comments)    Crazy-nightmares  . Amoxicillin Diarrhea  . Cefdinir Cough  . Doxycycline Diarrhea  . Erythromycin Other (See Comments)    GI upset  . Lisinopril Cough  . Lisinopril     unknown  . Oxybutynin Other (See Comments)    Dry mouth  . Sulfa Antibiotics Other (See Comments)    GI upset  . Sulfacetamide Sodium Other (See Comments)    GI upset    Consultations:  None  Procedures/Studies: Dg Chest 2 View  Result Date: 08/15/2016 CLINICAL DATA:  Congestive cough with weakness and shortness of Breath starting this morning. EXAM: CHEST  2 VIEW COMPARISON:  02/14/2016 FINDINGS: AP and lateral views of the chest show low volumes. The cardio pericardial silhouette is enlarged. There is pulmonary vascular congestion without overt pulmonary edema. Interstitial markings are diffusely coarsened with chronic features. Right infrahilar airspace disease may be atelectatic although pneumonia could have this appearance. The visualized bony structures of the thorax are intact. Telemetry leads overlie the chest. IMPRESSION: Cardiomegaly with vascular congestion and underlying chronic interstitial changes. Right infrahilar airspace disease.  Atelectasis versus pneumonia. Electronically Signed   By: Misty Stanley M.D.   On: 08/15/2016 18:11    Subjective: Feels "much better" breathing easily even with exertion. Still coughing but no chest pain palpitations or leg swelling. Diarrhea x1 all day today.  Eating 100%.   Discharge Exam: Vitals:   08/18/16 0139 08/18/16 0507  BP:  138/69  Pulse:  63  Resp:  18  Temp: 98.3 F (36.8 C) 98 F (36.7 C)   Vitals:   08/17/16 2208 08/17/16 2350 08/18/16 0139 08/18/16 0507  BP: (!) 148/74   138/69  Pulse: 81   63  Resp: 19   18  Temp: 97.6 F (36.4 C) (!) 101.9 F (38.8 C) 98.3 F (36.8 C) 98 F (36.7 C)  TempSrc: Oral Oral Oral Oral  SpO2: 97%   100%  Weight:      Height:       General: Pt is alert, awake, not in acute distress Cardiovascular: RRR, S1/S2 +, no rubs, no gallops Respiratory: Nonlabored on room air, diffuse rhonchi without crackles.  Abdominal: Soft, NT, ND, bowel sounds + Extremities: trace pedal edema, no cyanosis  The results of significant diagnostics from this hospitalization (including imaging, microbiology, ancillary and laboratory) are listed below for reference.    Microbiology: Recent Results (from the past 240 hour(s))  Culture, blood (Routine x 2)     Status: None (Preliminary result)   Collection Time: 08/15/16  5:27 PM  Result Value Ref Range Status   Specimen Description BLOOD RIGHT HAND  Final   Special Requests BOTTLES DRAWN AEROBIC AND ANAEROBIC 5CC  Final   Culture   Final    NO GROWTH 2 DAYS Performed at [facility]    Report Status PENDING  Incomplete  Culture, blood (Routine x 2)     Status: None (Preliminary result)   Collection Time: 08/15/16  5:34 PM  Result Value Ref Range Status   Specimen Description BLOOD BLOOD LEFT FOREARM  Final   Special Requests BOTTLES DRAWN AEROBIC AND ANAEROBIC 5CC  Final   Culture   Final    NO GROWTH 2 DAYS Performed at [facility]    Report Status PENDING  Incomplete  Urine culture     Status: Abnormal   Collection Time: 08/15/16  9:44 PM  Result Value Ref Range Status   Specimen Description URINE, RANDOM  Final   Special Requests NONE  Final   Culture MULTIPLE SPECIES PRESENT, SUGGEST RECOLLECTION (A)  Final   Report Status  08/17/2016 FINAL  Final  C difficile quick scan w PCR reflex     Status: None   Collection Time: 08/17/16  2:15 PM  Result Value Ref Range Status   C Diff antigen NEGATIVE NEGATIVE Final   C Diff toxin NEGATIVE NEGATIVE Final   C Diff interpretation No C. difficile detected.  Final     Labs: BNP (last 3 results) No results for input(s): BNP in the last 8760 hours. Basic Metabolic Panel:  Recent Labs Lab 08/15/16 1727 08/16/16 0554 08/18/16 0647  NA 136 138 139  K 3.8 3.6 3.5  CL 102 106 106  CO2 23 24 26   GLUCOSE 107* 96 92  BUN 19 23* 17  CREATININE 1.14* 1.08* 0.97  CALCIUM 8.6* 8.0* 8.4*   Liver Function Tests:  Recent Labs Lab 08/15/16 1727  AST 28  ALT 19  ALKPHOS 70  BILITOT 1.3*  PROT 7.0  ALBUMIN 3.7   No results for input(s): LIPASE, AMYLASE in the last 168 hours. No results for input(s): AMMONIA in the last 168 hours. CBC:  Recent Labs Lab 08/15/16 1727 08/16/16 0554 08/18/16 0647  WBC 5.1 2.8* 3.3*  NEUTROABS 4.4  --   --   HGB 10.0* 9.1* 10.0*  HCT 28.9* 28.0* 30.5*  MCV 88.1 87.0 85.0  PLT 178 138* 168   Cardiac Enzymes: No results for input(s): CKTOTAL, CKMB, CKMBINDEX, TROPONINI in the last 168 hours. BNP: Invalid input(s): POCBNP CBG:  Recent Labs Lab 08/17/16 1135  GLUCAP 110*   D-Dimer No results for input(s): DDIMER in the last 72 hours. Hgb A1c No results for input(s): HGBA1C in the last 72 hours. Lipid Profile No results for input(s): CHOL, HDL, LDLCALC, TRIG, CHOLHDL, LDLDIRECT in the last 72 hours. Thyroid function studies No results for input(s): TSH, T4TOTAL, T3FREE, THYROIDAB in the last 72 hours.  Invalid input(s): FREET3 Anemia work up No results for input(s): VITAMINB12, FOLATE, FERRITIN, TIBC, IRON, RETICCTPCT in the last 72 hours. Urinalysis    Component Value Date/Time   COLORURINE YELLOW 08/15/2016 2144   APPEARANCEUR HAZY (A) 08/15/2016 2144   LABSPEC 1.015 08/15/2016 2144   PHURINE 6.0 08/15/2016  2144   GLUCOSEU NEGATIVE 08/15/2016 2144   HGBUR SMALL (A) 08/15/2016 2144   BILIRUBINUR NEGATIVE 08/15/2016 2144   BILIRUBINUR neg 04/20/2013 [place] 08/15/2016 2144   PROTEINUR 100 (A) 08/15/2016 2144   UROBILINOGEN 0.2 05/21/2015 1920   NITRITE NEGATIVE 08/15/2016 2144   LEUKOCYTESUR TRACE (A) 08/15/2016 2144   Sepsis Labs Invalid input(s): PROCALCITONIN,  WBC,  LACTICIDVEN Microbiology Recent Results (from the past 240 hour(s))  Culture, blood (Routine x 2)     Status: None (Preliminary result)   Collection Time: 08/15/16  5:27 PM  Result Value Ref Range Status   Specimen Description BLOOD RIGHT HAND  Final   Special Requests BOTTLES DRAWN AEROBIC AND ANAEROBIC 5CC  Final   Culture   Final    NO GROWTH 2 DAYS Performed at [facility]    Report Status PENDING  Incomplete  Culture, blood (Routine x 2)     Status: None (Preliminary result)   Collection Time: 08/15/16  5:34 PM  Result Value Ref Range Status   Specimen Description BLOOD BLOOD LEFT FOREARM  Final   Special Requests BOTTLES DRAWN AEROBIC AND ANAEROBIC 5CC  Final   Culture   Final    NO GROWTH 2 DAYS Performed at [facility]    Report Status PENDING  Incomplete  Urine culture     Status: Abnormal   Collection Time: 08/15/16  9:44 PM  Result Value Ref Range Status   Specimen Description URINE, RANDOM  Final   Special Requests NONE  Final   Culture MULTIPLE SPECIES PRESENT, SUGGEST RECOLLECTION (A)  Final   Report Status 08/17/2016 FINAL  Final  C  difficile quick scan w PCR reflex     Status: None   Collection Time: 08/17/16  2:15 PM  Result Value Ref Range Status   C Diff antigen NEGATIVE NEGATIVE Final   C Diff toxin NEGATIVE NEGATIVE Final   C Diff interpretation No C. difficile detected.  Final    Time coordinating discharge: Over 30 minutes  Vance Gather, MD  [facility] 08/18/2016, 3:13 PM Pager [phone]  If 7PM-7AM, please contact  night-coverage [email or web address] Password [email or web address]

## 2016-08-19 NOTE — Care Management Note (Signed)
Case Management Note  Patient Details  Name: Natasha Ellis MRN: [id number] Date of Birth: 1931-08-02  Subjective/Objective:                   flu positive Action/Plan: Discharge planning Expected Discharge Date:  08/18/16               Expected Discharge Plan:  [place]  In-House Referral:     Discharge planning Services  CM Consult  Post Acute Care Choice:  Home Health Choice offered to:  Patient  DME Arranged:  N/A DME Agency:  NA  HH Arranged:  PT [place] Agency:  Other - See comment  Status of Service:  Completed, signed off  If discussed at [place] of Stay Meetings, dates discussed:    Additional Comments: Cm met with pt in room to confirm [facility] has internal PT; pt confirms. CM faxed facesheet, face to face and HHPT order to [facility].  Pt states she has all DME needed at home. No other CM needs were communicated. Dellie Catholic, RN 08/19/2016, 10:10 AM

## 2016-08-20 LAB — CULTURE, BLOOD (ROUTINE X 2)
Culture: NO GROWTH
Culture: NO GROWTH

## 2016-08-26 ENCOUNTER — Inpatient Hospital Stay (HOSPITAL_COMMUNITY)
Admission: EM | Admit: 2016-08-26 | Discharge: 2016-08-28 | DRG: 871 | Disposition: A | Discharge status: Home Health | Payer: Medicare Other | Attending: Family Medicine | Admitting: Family Medicine

## 2016-08-26 ENCOUNTER — Encounter (HOSPITAL_COMMUNITY): Payer: Self-pay | Admitting: Emergency Medicine

## 2016-08-26 ENCOUNTER — Emergency Department (HOSPITAL_COMMUNITY): Payer: Medicare Other

## 2016-08-26 DIAGNOSIS — G4733 Obstructive sleep apnea (adult) (pediatric): Secondary | ICD-10-CM | POA: Diagnosis present

## 2016-08-26 DIAGNOSIS — D509 Iron deficiency anemia, unspecified: Secondary | ICD-10-CM | POA: Diagnosis present

## 2016-08-26 DIAGNOSIS — D649 Anemia, unspecified: Secondary | ICD-10-CM | POA: Diagnosis present

## 2016-08-26 DIAGNOSIS — Z8572 Personal history of non-Hodgkin lymphomas: Secondary | ICD-10-CM

## 2016-08-26 DIAGNOSIS — Y95 Nosocomial condition: Secondary | ICD-10-CM | POA: Diagnosis present

## 2016-08-26 DIAGNOSIS — Z6841 Body Mass Index (BMI) 40.0 and over, adult: Secondary | ICD-10-CM | POA: Diagnosis not present

## 2016-08-26 DIAGNOSIS — J4520 Mild intermittent asthma, uncomplicated: Secondary | ICD-10-CM | POA: Diagnosis not present

## 2016-08-26 DIAGNOSIS — R069 Unspecified abnormalities of breathing: Secondary | ICD-10-CM | POA: Diagnosis not present

## 2016-08-26 DIAGNOSIS — I119 Hypertensive heart disease without heart failure: Secondary | ICD-10-CM | POA: Diagnosis present

## 2016-08-26 DIAGNOSIS — J9601 Acute respiratory failure with hypoxia: Secondary | ICD-10-CM | POA: Diagnosis present

## 2016-08-26 DIAGNOSIS — Z8249 Family history of ischemic heart disease and other diseases of the circulatory system: Secondary | ICD-10-CM | POA: Diagnosis not present

## 2016-08-26 DIAGNOSIS — K219 Gastro-esophageal reflux disease without esophagitis: Secondary | ICD-10-CM | POA: Diagnosis present

## 2016-08-26 DIAGNOSIS — Z7902 Long term (current) use of antithrombotics/antiplatelets: Secondary | ICD-10-CM

## 2016-08-26 DIAGNOSIS — Z87891 Personal history of nicotine dependence: Secondary | ICD-10-CM

## 2016-08-26 DIAGNOSIS — A419 Sepsis, unspecified organism: Secondary | ICD-10-CM

## 2016-08-26 DIAGNOSIS — R0602 Shortness of breath: Secondary | ICD-10-CM | POA: Diagnosis not present

## 2016-08-26 DIAGNOSIS — J45909 Unspecified asthma, uncomplicated: Secondary | ICD-10-CM | POA: Diagnosis present

## 2016-08-26 DIAGNOSIS — I10 Essential (primary) hypertension: Secondary | ICD-10-CM | POA: Diagnosis present

## 2016-08-26 DIAGNOSIS — Z8673 Personal history of transient ischemic attack (TIA), and cerebral infarction without residual deficits: Secondary | ICD-10-CM

## 2016-08-26 DIAGNOSIS — Z90710 Acquired absence of both cervix and uterus: Secondary | ICD-10-CM

## 2016-08-26 DIAGNOSIS — J189 Pneumonia, unspecified organism: Secondary | ICD-10-CM | POA: Diagnosis not present

## 2016-08-26 LAB — COMPREHENSIVE METABOLIC PANEL
ALT: 16 U/L (ref 14–54)
AST: 18 U/L (ref 15–41)
Albumin: 3.7 g/dL (ref 3.5–5.0)
Alkaline Phosphatase: 77 U/L (ref 38–126)
Anion gap: 9 (ref 5–15)
BUN: 16 mg/dL (ref 6–20)
CO2: 27 mmol/L (ref 22–32)
Calcium: 9.3 mg/dL (ref 8.9–10.3)
Chloride: 101 mmol/L (ref 101–111)
Creatinine, Ser: 0.96 mg/dL (ref 0.44–1.00)
GFR calc Af Amer: >60 mL/min (ref >60)
GFR calc non Af Amer: 53 mL/min — ABNORMAL LOW (ref >60)
Glucose, Bld: 132 mg/dL — ABNORMAL HIGH (ref 65–99)
Potassium: 3.9 mmol/L (ref 3.5–5.1)
Sodium: 137 mmol/L (ref 135–145)
Total Bilirubin: 1.2 mg/dL (ref 0.3–1.2)
Total Protein: 7 g/dL (ref 6.5–8.1)

## 2016-08-26 LAB — URINALYSIS, ROUTINE W REFLEX MICROSCOPIC
Bilirubin Urine: NEGATIVE
Glucose, UA: NEGATIVE mg/dL
Hgb urine dipstick: NEGATIVE
Ketones, ur: NEGATIVE mg/dL
Leukocytes, UA: NEGATIVE
Nitrite: NEGATIVE
Protein, ur: 100 mg/dL — AB
Specific Gravity, Urine: 1.013 (ref 1.005–1.030)
pH: 7 (ref 5.0–8.0)

## 2016-08-26 LAB — CBC WITH DIFFERENTIAL/PLATELET
Basophils Absolute: 0 10*3/uL (ref 0.0–0.1)
Basophils Relative: 0 %
Eosinophils Absolute: 0 10*3/uL (ref 0.0–0.7)
Eosinophils Relative: 0 %
HCT: 28 % — ABNORMAL LOW (ref 36.0–46.0)
Hemoglobin: 10.2 g/dL — ABNORMAL LOW (ref 12.0–15.0)
Lymphocytes Relative: 9 %
Lymphs Abs: 0.6 10*3/uL — ABNORMAL LOW (ref 0.7–4.0)
MCH: 32.1 pg (ref 26.0–34.0)
MCHC: 36.4 g/dL — ABNORMAL HIGH (ref 30.0–36.0)
MCV: 88.1 fL (ref 78.0–100.0)
Monocytes Absolute: 0.2 10*3/uL (ref 0.1–1.0)
Monocytes Relative: 3 %
Neutro Abs: 5.8 10*3/uL (ref 1.7–7.7)
Neutrophils Relative %: 88 %
Platelets: 248 10*3/uL (ref 150–400)
RBC: 3.18 MIL/uL — ABNORMAL LOW (ref 3.87–5.11)
RDW: 15.7 % — ABNORMAL HIGH (ref 11.5–15.5)
WBC: 6.7 10*3/uL (ref 4.0–10.5)

## 2016-08-26 LAB — PROTIME-INR
INR: 1.26
Prothrombin Time: 15.9 seconds — ABNORMAL HIGH (ref 11.4–15.2)

## 2016-08-26 LAB — MAGNESIUM: Magnesium: 1.7 mg/dL (ref 1.7–2.4)

## 2016-08-26 LAB — I-STAT CG4 LACTIC ACID, ED
Lactic Acid, Venous: 1.37 mmol/L (ref 0.5–1.9)
Lactic Acid, Venous: 1.19 mmol/L (ref 0.5–1.9)

## 2016-08-26 MED ORDER — ALBUTEROL SULFATE (2.5 MG/3ML) 0.083% IN NEBU
5.0000 mg | INHALATION_SOLUTION | Freq: Once | RESPIRATORY_TRACT | Status: AC
  Administered 2016-08-26: 5 mg via RESPIRATORY_TRACT
  Filled 2016-08-26: qty 6

## 2016-08-26 MED ORDER — IPRATROPIUM-ALBUTEROL 0.5-2.5 (3) MG/3ML IN SOLN
3.0000 mL | Freq: Three times a day (TID) | RESPIRATORY_TRACT | Status: DC
  Administered 2016-08-27 – 2016-08-28 (×4): 3 mL via RESPIRATORY_TRACT
  Filled 2016-08-26 (×5): qty 3

## 2016-08-26 MED ORDER — MAGNESIUM SULFATE 2 GM/50ML IV SOLN
2.0000 g | Freq: Once | INTRAVENOUS | Status: AC
  Administered 2016-08-27: 2 g via INTRAVENOUS
  Filled 2016-08-26: qty 50

## 2016-08-26 MED ORDER — VANCOMYCIN HCL IN DEXTROSE 1-5 GM/200ML-% IV SOLN
1000.0000 mg | Freq: Once | INTRAVENOUS | Status: AC
  Administered 2016-08-26: 1000 mg via INTRAVENOUS
  Filled 2016-08-26: qty 200

## 2016-08-26 MED ORDER — ENOXAPARIN SODIUM 60 MG/0.6ML ~~LOC~~ SOLN
60.0000 mg | SUBCUTANEOUS | Status: DC
  Administered 2016-08-27 (×2): 60 mg via SUBCUTANEOUS
  Filled 2016-08-26 (×3): qty 0.6

## 2016-08-26 MED ORDER — IPRATROPIUM-ALBUTEROL 0.5-2.5 (3) MG/3ML IN SOLN: 3.0000 mL | Freq: Four times a day (QID) | RESPIRATORY_TRACT | Status: DC

## 2016-08-26 MED ORDER — ACETAMINOPHEN 325 MG PO TABS
650.0000 mg | ORAL_TABLET | Freq: Once | ORAL | Status: AC | PRN
  Administered 2016-08-26: 650 mg via ORAL
  Filled 2016-08-26: qty 2

## 2016-08-26 MED ORDER — IPRATROPIUM-ALBUTEROL 0.5-2.5 (3) MG/3ML IN SOLN
3.0000 mL | Freq: Once | RESPIRATORY_TRACT | Status: AC
  Administered 2016-08-26: 3 mL via RESPIRATORY_TRACT
  Filled 2016-08-26: qty 3

## 2016-08-26 MED ORDER — SODIUM CHLORIDE 0.9% FLUSH
3.0000 mL | Freq: Two times a day (BID) | INTRAVENOUS | Status: DC
  Administered 2016-08-27: 3 mL via INTRAVENOUS

## 2016-08-26 MED ORDER — IPRATROPIUM BROMIDE 0.02 % IN SOLN: 0.5000 mg | Freq: Four times a day (QID) | RESPIRATORY_TRACT | Status: DC

## 2016-08-26 MED ORDER — VANCOMYCIN HCL 10 G IV SOLR
1250.0000 mg | INTRAVENOUS | Status: DC
  Administered 2016-08-27: 1250 mg via INTRAVENOUS
  Filled 2016-08-26 (×2): qty 1250

## 2016-08-26 MED ORDER — DEXTROSE 5 % IV SOLN
1.0000 g | Freq: Three times a day (TID) | INTRAVENOUS | Status: DC
  Administered 2016-08-27 – 2016-08-28 (×3): 1 g via INTRAVENOUS
  Filled 2016-08-26 (×6): qty 1

## 2016-08-26 MED ORDER — ALBUTEROL SULFATE (2.5 MG/3ML) 0.083% IN NEBU
2.5000 mg | INHALATION_SOLUTION | RESPIRATORY_TRACT | Status: DC | PRN
  Filled 2016-08-26: qty 3

## 2016-08-26 MED ORDER — SODIUM CHLORIDE 0.9 % IV BOLUS (SEPSIS)
1000.0000 mL | Freq: Once | INTRAVENOUS | Status: AC
  Administered 2016-08-26: 1000 mL via INTRAVENOUS

## 2016-08-26 MED ORDER — DEXTROSE 5 % IV SOLN
2.0000 g | Freq: Once | INTRAVENOUS | Status: AC
  Administered 2016-08-26: 2 g via INTRAVENOUS
  Filled 2016-08-26: qty 2

## 2016-08-26 MED ORDER — ALBUTEROL SULFATE (2.5 MG/3ML) 0.083% IN NEBU: 2.5000 mg | INHALATION_SOLUTION | Freq: Four times a day (QID) | RESPIRATORY_TRACT | Status: DC

## 2016-08-26 MED ORDER — ACETAMINOPHEN 500 MG PO TABS: 1000.0000 mg | ORAL_TABLET | Freq: Once | ORAL | Status: DC

## 2016-08-26 NOTE — ED Provider Notes (Signed)
[name] DEPT Provider Note   CSN: [id number] Arrival date & time: 08/26/16  1627     History   Chief Complaint Chief Complaint  Patient presents with  . Shortness of Breath    HPI Natasha Ellis is a 83 y.o. female.  The history is provided by the patient and a relative.  Shortness of Breath  This is a new problem. The average episode lasts 4 days. The problem occurs continuously.The problem has been gradually worsening. Associated symptoms include a fever and cough. Pertinent negatives include no sputum production. It is unknown what precipitated the problem. She has tried nothing for the symptoms. She has had prior hospitalizations. She has had prior ED visits. Associated medical issues comments: influenza recently.    Past Medical History:  Diagnosis Date  . Arthritis   . Asthma   . Cancer ([place])    Lymphoma  . Cataract   . Chronic cough   . Diverticulosis of intestine    H/O  . DJD (degenerative joint disease) of lumbar spine   . Hypercholesteremia   . Hypertension    pt states I do not have high blood pressure  . IBS (irritable bowel syndrome)   . Left knee DJD   . 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 Right knee DJD   . Sleep apnea     Patient Active Problem List   Diagnosis Date Noted  . Influenza 08/15/2016  . Dehydration 08/15/2016  . Influenza with respiratory manifestation 08/15/2016  . Hypoxia 08/15/2016  . Severe obesity (BMI >= 40) ([place]) 07/30/2015  . Recurrent pneumonia 06/19/2015  . Anemia 06/10/2015  . Anemia due to other cause 06/10/2015  . Fever 09/20/2014  . OSA (obstructive sleep apnea) 09/20/2014  . History of lymphoma 09/20/2014  . History of TIA (transient ischemic attack) 09/20/2014  . Diarrhea   . Nausea with vomiting 08/15/2014  . Pain in gums 08/15/2014  . Neutropenia ([place]) 08/15/2014  . Hypersensitivity reaction 05/31/2014  . Waldenstrom's macroglobulinemia ([place]) 04/19/2014  . HTN  (hypertension) 04/29/2013  . HLD (hyperlipidemia) 04/29/2013  . Lacunar infarction ([place]) 04/29/2013  . GERD (gastroesophageal reflux disease) 04/29/2013  . TIA (transient ischemic attack) 04/29/2013  . Back pain 04/29/2013  . Asthma 04/29/2013  . Dyspnea 01/25/2013  . Chronic cough 01/01/2013    Past Surgical History:  Procedure Laterality Date  . ABDOMINAL HYSTERECTOMY  1984   TAH  . ABDOMINAL HYSTERECTOMY    . EYE SURGERY Bilateral    with lens replacement  . EYE SURGERY Left    retnia  . KNEE ARTHROSCOPY Left   . NASAL SINUS SURGERY N/A 12/04/2015   Procedure: ENDOSCOPIC SINUS SURGERY;  Surgeon: Izora Gala, MD;  Location: [facility];  Service: ENT;  Laterality: N/A;  . TEE WITHOUT CARDIOVERSION N/A 04/14/2016   Procedure: TRANSESOPHAGEAL ECHOCARDIOGRAM (TEE);  Surgeon: Lelon Perla, MD;  Location: [facility] ENDOSCOPY;  Service: Cardiovascular;  Laterality: N/A;  . TONSILLECTOMY AND ADENOIDECTOMY  childhood    OB History    Gravida Para Term Preterm AB Living   0 0 0 0 0     SAB TAB Ectopic Multiple Live Births   0 0 0           Home Medications    Prior to Admission medications   Medication Sig Start Date End Date Taking? Authorizing Provider  acetaminophen (TYLENOL) 500 MG tablet Take 1,000 mg by mouth every 6 (six) hours as needed for pain.  Historical Provider, MD  albuterol (PROAIR HFA) 108 (90 Base) MCG/ACT inhaler Inhale 2 puffs into the lungs every 6 (six) hours as needed for wheezing or shortness of breath. 02/14/16   Tammy S Parrett, NP  albuterol (PROVENTIL) (2.5 MG/3ML) 0.083% nebulizer solution Take 3 mLs (2.5 mg total) by nebulization every 4 (four) hours as needed for wheezing or shortness of breath. 09/22/14   Theodis Blaze, MD  alendronate (FOSAMAX) 70 MG tablet Take 70 mg by mouth once a week. Sundays 10/14/14   Historical Provider, MD  b complex vitamins capsule Take 1 capsule by mouth daily.    Historical Provider, MD  calcium gluconate 500 MG tablet Take 1  tablet by mouth daily.    Historical Provider, MD  clopidogrel (PLAVIX) 75 MG tablet Take 75 mg by mouth daily.  02/03/16   Historical Provider, MD  dextromethorphan (DELSYM) 30 MG/5ML liquid Take 15 mg by mouth 2 (two) times daily as needed for cough.     Historical Provider, MD  diphenoxylate-atropine (LOMOTIL) 2.5-0.025 MG tablet Take 1 tablet by mouth 4 (four) times daily as needed for diarrhea or loose stools. 08/18/16   Patrecia Pour, MD  ferrous sulfate 325 (65 FE) MG tablet Take 325 mg by mouth daily.     Historical Provider, MD  furosemide (LASIX) 20 MG tablet Take 1 tablet (20 mg total) by mouth daily. 03/30/13   Brand Males, MD  ibuprofen (ADVIL,MOTRIN) 200 MG tablet Take 400 mg by mouth every 6 (six) hours as needed for moderate pain.     Historical Provider, MD  ofloxacin (OCUFLOX) 0.3 % ophthalmic solution Place 1 drop into both eyes 4 (four) times daily.  08/12/16   Historical Provider, MD  oseltamivir (TAMIFLU) 75 MG capsule Take 1 capsule (75 mg total) by mouth 2 (two) times daily. 08/18/16   Patrecia Pour, MD  potassium chloride (K-DUR) 10 MEQ tablet Take 10 mEq by mouth daily.    Historical Provider, MD  sodium chloride (OCEAN) 0.65 % SOLN nasal spray Place 1 spray into both nostrils 3 (three) times daily as needed for congestion.    Historical Provider, MD    Family History Family History  Problem Relation Age of Onset  . Stroke Father   . Heart failure Mother 100  . Breast cancer Sister     Social History Social History  Substance Use Topics  . Smoking status: Former Smoker    Packs/day: 1.00    Years: 25.00    Types: Cigarettes    Quit date: 08/04/1979  . Smokeless tobacco: Never Used  . Alcohol use No     Allergies   Codeine; Amoxicillin; Cefdinir; Doxycycline; Erythromycin; Lisinopril; Lisinopril; Oxybutynin; Sulfa antibiotics; and Sulfacetamide sodium   Review of Systems Review of Systems  Constitutional: Positive for fever.  Respiratory: Positive for  cough and shortness of breath. Negative for sputum production.   All other systems reviewed and are negative.    Physical Exam Updated Vital Signs BP 173/67   Pulse 104   Temp 103 F (39.4 C) (Oral)   Resp 26   SpO2 95%   Physical Exam  Constitutional: She is oriented to person, place, and time. She appears well-developed and well-nourished. No distress.  HENT:  Head: Normocephalic.  Nose: Nose normal.  Eyes: Conjunctivae are normal.  Neck: Neck supple. No tracheal deviation present.  Cardiovascular: Normal rate, regular rhythm and normal heart sounds.   Pulmonary/Chest: Effort normal. No respiratory distress. She has rales (bilateral).  Abdominal: Soft. She exhibits no distension. There is no tenderness.  Neurological: She is alert and oriented to person, place, and time.  Skin: Skin is warm and dry.  Psychiatric: She has a normal mood and affect.     ED Treatments / Results  Labs (all labs ordered are listed, but only abnormal results are displayed) Labs Reviewed  CBC WITH DIFFERENTIAL/PLATELET - Abnormal; Notable for the following:       Result Value   RBC 3.18 (*)    Hemoglobin 10.2 (*)    HCT 28.0 (*)    MCHC 36.4 (*)    RDW 15.7 (*)    Lymphs Abs 0.6 (*)    All other components within normal limits  PROTIME-INR - Abnormal; Notable for the following:    Prothrombin Time 15.9 (*)    All other components within normal limits  CULTURE, BLOOD (ROUTINE X 2)  CULTURE, BLOOD (ROUTINE X 2)  URINE CULTURE  COMPREHENSIVE METABOLIC PANEL  URINALYSIS, ROUTINE W REFLEX MICROSCOPIC  I-STAT CG4 LACTIC ACID, ED    EKG  EKG Interpretation  Date/Time:  Wednesday August 26 2016 16:48:31 EST Ventricular Rate:  98 PR Interval:    QRS Duration: 122 QT Interval:  361 QTC Calculation: 461 R Axis:   2 Text Interpretation:  Sinus rhythm Right bundle branch block No significant change since last tracing Confirmed by Alliana Mcauliff MD, Dequan Kindred [id number]) on 08/26/2016 5:47:01 PM        Radiology Dg Chest 2 View  Result Date: 08/26/2016 CLINICAL DATA:  Shortness of breath EXAM: CHEST  2 VIEW COMPARISON:  08/15/2016 FINDINGS: Chronic cardiomegaly. Negative aortic and hilar contours. Streaky basilar opacities in generalized interstitial coarsening. Tree-in-bud opacities and advanced airway thickening with atelectasis seen on January 08, 2016 chest CT. No edema, effusion, or pneumothorax. IMPRESSION: 1. Bibasilar opacity likely related to airway disease seen on June 2017 chest CT. These changes could easily obscure superimposed bronchopneumonia. 2. Cardiomegaly without failure. Electronically Signed   By: Monte Fantasia M.D.   On: 08/26/2016 17:08    Procedures Procedures (including critical care time)  Medications Ordered in ED Medications  enoxaparin (LOVENOX) injection 60 mg (60 mg Subcutaneous Given 08/27/16 0002)  sodium chloride flush (NS) 0.9 % injection 3 mL (3 mLs Intravenous Given 08/27/16 0002)  albuterol (PROVENTIL) (2.5 MG/3ML) 0.083% nebulizer solution 2.5 mg (not administered)  ceFEPIme (MAXIPIME) 1 g in dextrose 5 % 50 mL IVPB (not administered)  vancomycin (VANCOCIN) 1,250 mg in sodium chloride 0.9 % 250 mL IVPB (not administered)  ipratropium-albuterol (DUONEB) 0.5-2.5 (3) MG/3ML nebulizer solution 3 mL (not administered)  guaiFENesin-dextromethorphan (ROBITUSSIN DM) 100-10 MG/5ML syrup 5 mL (not administered)  pantoprazole (PROTONIX) EC tablet 40 mg (not administered)  albuterol (PROVENTIL) (2.5 MG/3ML) 0.083% nebulizer solution 5 mg (5 mg Nebulization Given 08/26/16 1714)  acetaminophen (TYLENOL) tablet 650 mg (650 mg Oral Given 08/26/16 1702)  sodium chloride 0.9 % bolus 1,000 mL (0 mLs Intravenous Stopped 08/26/16 1913)  ceFEPIme (MAXIPIME) 2 g in dextrose 5 % 50 mL IVPB (0 g Intravenous Stopped 08/26/16 2022)  vancomycin (VANCOCIN) IVPB 1000 mg/200 mL premix (0 mg Intravenous Stopped 08/26/16 1913)  vancomycin (VANCOCIN) IVPB 1000 mg/200 mL premix (1,000 mg  Intravenous New Bag/Given 08/26/16 2037)  magnesium sulfate IVPB 2 g 50 mL (2 g Intravenous Given 08/27/16 0001)  ipratropium-albuterol (DUONEB) 0.5-2.5 (3) MG/3ML nebulizer solution 3 mL (3 mLs Nebulization Given 08/26/16 2045)     Initial Impression / Assessment and Plan / ED Course  I  have reviewed the triage vital signs and the nursing notes.  Pertinent labs & imaging results that were available during my care of the patient were reviewed by me and considered in my medical decision making (see chart for details).     83 year old female presents with ongoing shortness of breath for the last 4 days. Presents with fever, bilateral opacities on x-ray with concern for underlying pneumonia secondary to previous influenza infection. Hypoxemic with new O2 requirement. Will cover for HCAP and sepsis. Hospitalist was consulted for admission and will see the patient in the emergency department.   Final Clinical Impressions(s) / ED Diagnoses   Final diagnoses:  HCAP (healthcare-associated pneumonia)  Acute respiratory failure with hypoxemia ([place])  Sepsis, due to unspecified organism [facility])    New Prescriptions New Prescriptions   No medications on file     Leo Grosser, MD 08/27/16 [phone]

## 2016-08-26 NOTE — ED Notes (Signed)
Cyril Mourning, RN notified RT of patients nebulizer treatment.

## 2016-08-26 NOTE — Progress Notes (Signed)
Pt. assessed to TID, aerosols planed to start 0800, has prn.

## 2016-08-26 NOTE — Progress Notes (Signed)
Pharmacy Antibiotic Note  Natasha Ellis is a 83 y.o. female admitted on 08/26/2016 with pneumonia, code sepsis.  Pharmacy has been consulted for Cefepime and Vancomycin dosing.  Plan:  Cefepime 2g IV x1 then 1g IV q8h  Vancomycin 2g IV x1 then 1250 mg IV q24h.  Measure Vanc trough at steady state.  Follow up renal fxn, culture results, and clinical course.   Height: 5\' 6"  (167.6 cm) Weight: 255 lb (115.7 kg) IBW/kg (Calculated) : 59.3  Temp (24hrs), Avg:103 F (39.4 C), Min:103 F (39.4 C), Max:103 F (39.4 C)   Recent Labs Lab 08/26/16 1716 08/26/16 1727  WBC 6.7  --   CREATININE 0.96  --   LATICACIDVEN  --  1.37    Estimated Creatinine Clearance: 57.4 mL/min (by C-G formula based on SCr of 0.96 mg/dL).    Allergies  Allergen Reactions  . Codeine Other (See Comments)    Crazy-nightmares  . Amoxicillin Diarrhea  . Cefdinir Cough  . Doxycycline Diarrhea  . Erythromycin Other (See Comments)    GI upset  . Lisinopril Cough  . Lisinopril     unknown  . Oxybutynin Other (See Comments)    Dry mouth  . Sulfa Antibiotics Other (See Comments)    GI upset  . Sulfacetamide Sodium Other (See Comments)    GI upset    Antimicrobials this admission: 1/24 Vancomycin >>  1/24 Cefepime >>   Dose adjustments this admission:   Microbiology results: 1/24 BCx: sent 1/24 UCx: sent   Thank you for allowing pharmacy to be a part of this patient's care.  Gretta Arab PharmD, BCPS Pager [phone] 08/26/2016 6:12 PM

## 2016-08-26 NOTE — H&P (Signed)
History and Physical    Natasha Ellis [id number] DOB: [birth year] DOA: 08/26/2016  PCP: Irven Shelling, MD   Patient coming from: Home.  Chief Complaint: Shortness of breath  HPI: Natasha Ellis is a 83 y.o. female with medical history significant of osteoarthritis, asthma, lymphoma, cataracts, diverticulosis, IBS, sleep apnea who comes in the emergency department with complaints of progressively worse shortness of breath since Saturday. Per patient, she was seen by her pulmonologist and was given Mucinex, Delsym and albuterol. However this has not provided any relief. She denies chest pain, palpitations, dizziness, diaphoresis, abdominal pain, nausea, emesis, melena or hematochezia. She denies dysuria or hematuria.  ED Course: The patient on arrival to the emergency department had 103.61F fever. She was also tachycardic at 104 BPM, blood pressure 217/183, O2 sat 88% and her respiratory rate was 34 breaths per minute. After she was given nebulizer treatment, acetaminophen, IV fluids and IV antibiotics the patient improved significantly. She is afebrile now.  Her workup shows UA negative for infection, her CMP and only showed an elevated glucose at 132 mg/dL. Her lactic acid was 1.37, WBC 6.7, hemoglobin 10.2 and platelets 248. Her chest radiograph showed bibasilar opacity consistent with bronchopneumonia. She also has cardiomegaly without failure.  Review of Systems: As per HPI otherwise 10 point review of systems negative.    Past Medical History:  Diagnosis Date  . Arthritis   . Asthma   . Cancer ([place])    Lymphoma  . Cataract   . Chronic cough   . Diverticulosis of intestine    H/O  . DJD (degenerative joint disease) of lumbar spine   . IBS (irritable bowel syndrome)   . Left knee DJD   . 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 Right knee DJD   . Sleep apnea     Past Surgical History:  Procedure Laterality Date  . ABDOMINAL  HYSTERECTOMY  1984   TAH  . ABDOMINAL HYSTERECTOMY    . EYE SURGERY Bilateral    with lens replacement  . EYE SURGERY Left    retnia  . KNEE ARTHROSCOPY Left   . NASAL SINUS SURGERY N/A 12/04/2015   Procedure: ENDOSCOPIC SINUS SURGERY;  Surgeon: Izora Gala, MD;  Location: [facility];  Service: ENT;  Laterality: N/A;  . TEE WITHOUT CARDIOVERSION N/A 04/14/2016   Procedure: TRANSESOPHAGEAL ECHOCARDIOGRAM (TEE);  Surgeon: Lelon Perla, MD;  Location: [facility] ENDOSCOPY;  Service: Cardiovascular;  Laterality: N/A;  . TONSILLECTOMY AND ADENOIDECTOMY  childhood     reports that she quit smoking about 37 years ago. Her smoking use included Cigarettes. She has a 25.00 pack-year smoking history. She has never used smokeless tobacco. She reports that she does not drink alcohol or use drugs.  Allergies  Allergen Reactions  . Codeine Other (See Comments)    Crazy-nightmares  . Amoxicillin Diarrhea    Has patient had a PCN reaction causing immediate rash, facial/tongue/throat swelling, SOB or lightheadedness with hypotension:  No Has patient had a PCN reaction causing severe rash involving mucus membranes or skin necrosis: No Has patient had a PCN reaction that required hospitalization No Has patient had a PCN reaction occurring within the last 10 years: Unknown--rx is diarrhea. If all of the above answers are "NO", then may proceed with Cephalosporin use.    . Cefdinir Cough  . Doxycycline Diarrhea  . Erythromycin Other (See Comments)    GI upset  . Lisinopril Cough  . Lisinopril  unknown  . Oxybutynin Other (See Comments)    Dry mouth  . Sulfa Antibiotics Other (See Comments)    GI upset  . Sulfacetamide Sodium Other (See Comments)    GI upset    Family History  Problem Relation Age of Onset  . Stroke Father   . Heart failure Mother 100  . Breast cancer Sister     Prior to Admission medications   Medication Sig Start Date End Date Taking? Authorizing Provider  albuterol  (PROVENTIL) (2.5 MG/3ML) 0.083% nebulizer solution Take 3 mLs (2.5 mg total) by nebulization every 4 (four) hours as needed for wheezing or shortness of breath. 09/22/14  Yes Theodis Blaze, MD  alendronate (FOSAMAX) 70 MG tablet Take 70 mg by mouth once a week. Sundays 10/14/14  Yes Historical Provider, MD  b complex vitamins capsule Take 1 capsule by mouth daily.   Yes Historical Provider, MD  calcium gluconate 500 MG tablet Take 1 tablet by mouth daily.   Yes Historical Provider, MD  clopidogrel (PLAVIX) 75 MG tablet Take 75 mg by mouth daily.  02/03/16  Yes Historical Provider, MD  dextromethorphan (DELSYM) 30 MG/5ML liquid Take 15 mg by mouth 2 (two) times daily as needed for cough.    Yes Historical Provider, MD  diphenoxylate-atropine (LOMOTIL) 2.5-0.025 MG tablet Take 1 tablet by mouth 4 (four) times daily as needed for diarrhea or loose stools. 08/18/16  Yes Patrecia Pour, MD  ferrous sulfate 325 (65 FE) MG tablet Take 325 mg by mouth daily.    Yes Historical Provider, MD  furosemide (LASIX) 20 MG tablet Take 1 tablet (20 mg total) by mouth daily. 03/30/13  Yes Brand Males, MD  guaiFENesin (MUCINEX) 600 MG 12 hr tablet Take 600 mg by mouth daily as needed (cough/congestion.).   Yes Historical Provider, MD  ibuprofen (ADVIL,MOTRIN) 200 MG tablet Take 400 mg by mouth every 6 (six) hours as needed for moderate pain.    Yes Historical Provider, MD  potassium chloride (K-DUR) 10 MEQ tablet Take 10 mEq by mouth daily.   Yes Historical Provider, MD  sodium chloride (OCEAN) 0.65 % SOLN nasal spray Place 1 spray into both nostrils 3 (three) times daily as needed for congestion.   Yes Historical Provider, MD  acetaminophen (TYLENOL) 500 MG tablet Take 1,000 mg by mouth every 6 (six) hours as needed for pain.     Historical Provider, MD  albuterol (PROAIR HFA) 108 (90 Base) MCG/ACT inhaler Inhale 2 puffs into the lungs every 6 (six) hours as needed for wheezing or shortness of breath. Patient not taking:  Reported on 08/26/2016 02/14/16   Fonnie Mu Parrett, NP  oseltamivir (TAMIFLU) 75 MG capsule Take 1 capsule (75 mg total) by mouth 2 (two) times daily. Patient not taking: Reported on 08/26/2016 08/18/16   Patrecia Pour, MD    Physical Exam:  Constitutional: NAD, calm, comfortable Vitals:   08/26/16 1900 08/26/16 1930 08/26/16 2000 08/26/16 2038  BP: (!) 133/53 (!) 126/49 (!) 115/52   Pulse:  79 79   Resp: (!) 34 (!) 31 26   Temp:    98.6 F (37 C)  TempSrc:    Oral  SpO2: 95% 95% 95%   Weight:      Height:       Eyes: PERRL, lids and conjunctivae normal ENMT: Mucous membranes are mildly dry Posterior pharynx clear of any exudate or lesions. Neck: normal, supple, no masses, no thyromegaly Respiratory: Bilateral rhonchi and wheezing, no crackles. Normal  respiratory effort. No accessory muscle use.  Cardiovascular: Regular rate and rhythm, no murmurs / rubs / gallops. No extremity edema. 2+ pedal pulses. No carotid bruits.  Abdomen: Soft, no tenderness, no masses palpated. No hepatosplenomegaly. Bowel sounds positive.  Musculoskeletal: no clubbing / cyanosis. Good ROM, no contractures. Normal muscle tone.  Skin: no rashes, lesions, ulcers. Neurologic: CN 2-12 grossly intact. Sensation intact, DTR normal. Strength 5/5 in all 4.  Psychiatric: Normal judgment and insight. Alert and oriented x 3.   Labs on Admission: I have personally reviewed following labs and imaging studies  CBC:  Recent Labs Lab 08/26/16 1716  WBC 6.7  NEUTROABS 5.8  HGB 10.2*  HCT 28.0*  MCV 88.1  PLT [id number]   Basic Metabolic Panel:  Recent Labs Lab 08/26/16 1716 08/26/16 2047  NA 137  --   K 3.9  --   CL 101  --   CO2 27  --   GLUCOSE 132*  --   BUN 16  --   CREATININE 0.96  --   CALCIUM 9.3  --   MG  --  1.7   GFR: Estimated Creatinine Clearance: 57.4 mL/min (by C-G formula based on SCr of 0.96 mg/dL). Liver Function Tests:  Recent Labs Lab 08/26/16 1716  AST 18  ALT 16  ALKPHOS 77    BILITOT 1.2  PROT 7.0  ALBUMIN 3.7   No results for input(s): LIPASE, AMYLASE in the last 168 hours. No results for input(s): AMMONIA in the last 168 hours. Coagulation Profile:  Recent Labs Lab 08/26/16 1716  INR 1.26   Cardiac Enzymes: No results for input(s): CKTOTAL, CKMB, CKMBINDEX, TROPONINI in the last 168 hours. BNP (last 3 results) No results for input(s): PROBNP in the last 8760 hours. HbA1C: No results for input(s): HGBA1C in the last 72 hours. CBG: No results for input(s): GLUCAP in the last 168 hours. Lipid Profile: No results for input(s): CHOL, HDL, LDLCALC, TRIG, CHOLHDL, LDLDIRECT in the last 72 hours. Thyroid Function Tests: No results for input(s): TSH, T4TOTAL, FREET4, T3FREE, THYROIDAB in the last 72 hours. Anemia Panel: No results for input(s): VITAMINB12, FOLATE, FERRITIN, TIBC, IRON, RETICCTPCT in the last 72 hours. Urine analysis:    Component Value Date/Time   COLORURINE AMBER (A) 08/26/2016 1723   APPEARANCEUR HAZY (A) 08/26/2016 1723   LABSPEC 1.013 08/26/2016 1723   PHURINE 7.0 08/26/2016 1723   GLUCOSEU NEGATIVE 08/26/2016 1723   HGBUR NEGATIVE 08/26/2016 1723   BILIRUBINUR NEGATIVE 08/26/2016 1723   BILIRUBINUR neg 04/20/2013 1728   KETONESUR NEGATIVE 08/26/2016 1723   PROTEINUR 100 (A) 08/26/2016 1723   UROBILINOGEN 0.2 05/21/2015 1920   NITRITE NEGATIVE 08/26/2016 1723   LEUKOCYTESUR NEGATIVE 08/26/2016 1723    Recent Results (from the past 240 hour(s))  C difficile quick scan w PCR reflex     Status: None   Collection Time: 08/17/16  2:15 PM  Result Value Ref Range Status   C Diff antigen NEGATIVE NEGATIVE Final   C Diff toxin NEGATIVE NEGATIVE Final   C Diff interpretation No C. difficile detected.  Final     Radiological Exams on Admission: Dg Chest 2 View  Result Date: 08/26/2016 CLINICAL DATA:  Shortness of breath EXAM: CHEST  2 VIEW COMPARISON:  08/15/2016 FINDINGS: Chronic cardiomegaly. Negative aortic and hilar  contours. Streaky basilar opacities in generalized interstitial coarsening. Tree-in-bud opacities and advanced airway thickening with atelectasis seen on January 08, 2016 chest CT. No edema, effusion, or pneumothorax. IMPRESSION: 1. Bibasilar opacity likely  related to airway disease seen on June 2017 chest CT. These changes could easily obscure superimposed bronchopneumonia. 2. Cardiomegaly without failure. Electronically Signed   By: Monte Fantasia M.D.   On: 08/26/2016 17:08    EKG: Independently reviewed. Vent. rate 98 BPM PR interval * ms QRS duration 122 ms QT/QTc 361/461 ms P-R-T axes -29 2 -66 Sinus rhythm Right bundle branch block No significant change since last tracing  Assessment/Plan Principal Problem:   HCAP (healthcare-associated pneumonia) Admit to telemetry a/inpatient. Continue supplemental oxygen. Continue scheduled and as needed bronchodilators. Continue cefepime and vancomycin per pharmacy. Check sputum Gram stain, culture and sensitivity. Follow-up blood cultures and sensitivity. Check a strep pneumoniae urinary antigen.  Active Problems:   HTN (hypertension) The patient, she does not have hypertension. However her blood pressure was elevated initially. BP is normal now. Heart healthy diet and monitor blood pressure. May need to start treatment.    GERD (gastroesophageal reflux disease) Pantoprazole 40 mg by mouth daily.    Asthma Continue supplemental oxygen and bronchodilators.    OSA (obstructive sleep apnea) Continue nocturnal CPAP.    Anemia Hemoglobin level around baseline. Monitor H&H.     DVT prophylaxis: Lovenox SQ. Code Status: Full code. Family Communication: Her daughter was present in the room. Disposition Plan: Admit for IV antibiotic therapy for several days. Consults called:  Admission status: Inpatient/telemetry.   Reubin Milan MD [facility] Pager [phone].  If 7PM-7AM, please contact  night-coverage [email or web address] Password [facility]  08/26/2016, 9:29 PM

## 2016-08-26 NOTE — ED Notes (Signed)
Notified Dr. Laneta Simmers about patients BP improvement.

## 2016-08-26 NOTE — ED Notes (Signed)
Bed: WA02 Expected date:  Expected time:  Means of arrival:  Comments: EMS-breathing problems

## 2016-08-26 NOTE — ED Notes (Signed)
Dr. Laneta Simmers was made aware of blood pressure and elevated temp. He reports he will be back to reassess patient and to continue to monitor.

## 2016-08-26 NOTE — ED Notes (Signed)
In radiology

## 2016-08-26 NOTE — ED Triage Notes (Addendum)
Pt from home with complaints of sob since Saturday. Pt was seen by her pulmonologist and told to take mucinex, delsym and albuterol. Pt states she has been doing so with minimal relief. Pt was given 5mg  albuterol by EMS prior to arrival.  Pt last took tylenol 1000 mg sometime today but she is not sure when.  Pt is febrile at time of assessment

## 2016-08-26 NOTE — ED Notes (Signed)
Assisted patient with female urinal. 

## 2016-08-26 NOTE — ED Notes (Signed)
Pt placed on 2L for oxygen of 89% RA

## 2016-08-27 DIAGNOSIS — J4520 Mild intermittent asthma, uncomplicated: Secondary | ICD-10-CM

## 2016-08-27 DIAGNOSIS — G4733 Obstructive sleep apnea (adult) (pediatric): Secondary | ICD-10-CM

## 2016-08-27 DIAGNOSIS — J189 Pneumonia, unspecified organism: Secondary | ICD-10-CM

## 2016-08-27 DIAGNOSIS — D649 Anemia, unspecified: Secondary | ICD-10-CM

## 2016-08-27 LAB — EXPECTORATED SPUTUM ASSESSMENT W GRAM STAIN, RFLX TO RESP C

## 2016-08-27 LAB — COMPREHENSIVE METABOLIC PANEL
ALT: 13 U/L — ABNORMAL LOW (ref 14–54)
AST: 14 U/L — ABNORMAL LOW (ref 15–41)
Albumin: 3 g/dL — ABNORMAL LOW (ref 3.5–5.0)
Alkaline Phosphatase: 59 U/L (ref 38–126)
Anion gap: 8 (ref 5–15)
BUN: 15 mg/dL (ref 6–20)
CO2: 26 mmol/L (ref 22–32)
Calcium: 8.7 mg/dL — ABNORMAL LOW (ref 8.9–10.3)
Chloride: 104 mmol/L (ref 101–111)
Creatinine, Ser: 0.94 mg/dL (ref 0.44–1.00)
GFR calc Af Amer: >60 mL/min (ref >60)
GFR calc non Af Amer: 55 mL/min — ABNORMAL LOW (ref >60)
Glucose, Bld: 111 mg/dL — ABNORMAL HIGH (ref 65–99)
Potassium: 3.7 mmol/L (ref 3.5–5.1)
Sodium: 138 mmol/L (ref 135–145)
Total Bilirubin: 1.1 mg/dL (ref 0.3–1.2)
Total Protein: 6.1 g/dL — ABNORMAL LOW (ref 6.5–8.1)

## 2016-08-27 LAB — CBC WITH DIFFERENTIAL/PLATELET
Basophils Absolute: 0 10*3/uL (ref 0.0–0.1)
Basophils Relative: 0 %
Eosinophils Absolute: 0.1 10*3/uL (ref 0.0–0.7)
Eosinophils Relative: 1 %
HCT: 26.3 % — ABNORMAL LOW (ref 36.0–46.0)
Hemoglobin: 8.7 g/dL — ABNORMAL LOW (ref 12.0–15.0)
Lymphocytes Relative: 12 %
Lymphs Abs: 0.8 10*3/uL (ref 0.7–4.0)
MCH: 27.5 pg (ref 26.0–34.0)
MCHC: 33.1 g/dL (ref 30.0–36.0)
MCV: 83.2 fL (ref 78.0–100.0)
Monocytes Absolute: 0.4 10*3/uL (ref 0.1–1.0)
Monocytes Relative: 6 %
Neutro Abs: 5.5 10*3/uL (ref 1.7–7.7)
Neutrophils Relative %: 81 %
Platelets: 247 10*3/uL (ref 150–400)
RBC: 3.16 MIL/uL — ABNORMAL LOW (ref 3.87–5.11)
RDW: 15.3 % (ref 11.5–15.5)
WBC: 6.8 10*3/uL (ref 4.0–10.5)

## 2016-08-27 LAB — STREP PNEUMONIAE URINARY ANTIGEN: Strep Pneumo Urinary Antigen: NEGATIVE

## 2016-08-27 LAB — PROCALCITONIN: Procalcitonin: 0.38 ng/mL

## 2016-08-27 LAB — EXPECTORATED SPUTUM ASSESSMENT W REFEX TO RESP CULTURE

## 2016-08-27 MED ORDER — VITAMINS A & D EX OINT
TOPICAL_OINTMENT | CUTANEOUS | Status: AC
  Filled 2016-08-27: qty 5

## 2016-08-27 MED ORDER — GUAIFENESIN-DM 100-10 MG/5ML PO SYRP
5.0000 mL | ORAL_SOLUTION | ORAL | Status: DC | PRN
  Administered 2016-08-27 (×2): 5 mL via ORAL
  Filled 2016-08-27 (×2): qty 10

## 2016-08-27 MED ORDER — SALINE SPRAY 0.65 % NA SOLN
1.0000 | NASAL | Status: DC | PRN
  Filled 2016-08-27: qty 44

## 2016-08-27 MED ORDER — PANTOPRAZOLE SODIUM 40 MG PO TBEC
40.0000 mg | DELAYED_RELEASE_TABLET | Freq: Every day | ORAL | Status: DC
  Administered 2016-08-27 – 2016-08-28 (×2): 40 mg via ORAL
  Filled 2016-08-27 (×3): qty 1

## 2016-08-27 MED ORDER — ACETAMINOPHEN 325 MG PO TABS
650.0000 mg | ORAL_TABLET | Freq: Four times a day (QID) | ORAL | Status: DC | PRN
  Administered 2016-08-27: 650 mg via ORAL
  Filled 2016-08-27: qty 2

## 2016-08-27 MED ORDER — FLUTICASONE PROPIONATE 50 MCG/ACT NA SUSP
1.0000 | Freq: Every day | NASAL | Status: DC
  Administered 2016-08-28: 1 via NASAL
  Filled 2016-08-27 (×2): qty 16

## 2016-08-27 MED ORDER — CLOPIDOGREL BISULFATE 75 MG PO TABS
75.0000 mg | ORAL_TABLET | Freq: Every day | ORAL | Status: DC
  Administered 2016-08-27 – 2016-08-28 (×2): 75 mg via ORAL
  Filled 2016-08-27 (×2): qty 1

## 2016-08-27 NOTE — Progress Notes (Signed)
PROGRESS NOTE [facility]   Natasha Ellis   [id number] DOB: Sep 16, 1930  DOA: 08/26/2016 PCP: Irven Shelling, MD   Brief Narrative:  Natasha Ellis is a 83 y.o. female with medical history significant of osteoarthritis, asthma, lymphoma, cataracts, diverticulosis, IBS, sleep apnea who comes in the emergency department with complaints of progressively worse shortness of breath since 4 days PTA. Per patient, she was seen by her pulmonologist and was given Mucinex, Delsym and albuterol., however did not provide any relief. In the ED patient was found to have fever T Max 103.4 and a CXR that showed bibasilar opacity consistent with bronchopneumonia, Oxygen sat at the high 80's and tachypnea. Patient admitted for PNA and started on IV abx, blood cultures were obtained and O2 as needed.   Subjective: Patient seen and examined, report feeling much better, still with some cough, but denies SOB, chest pain and palpitations. Patient afebrile since admission.   Assessment & Plan: HCAP - recently discharge on 08/18/16 treated for flu  CXR show b/l opacities consistent with bronchopneumonia  On vanc and Cefepime will continue for now  Procalcitonin 0.38 O2 as needed  Follow up blood cultures  Step pnemuo antigen negative  Continue nebs  HTN - stable  Not on BP meds at home other than Lasix  Monitor BP   OSA Continue CPAP   Asthma  Continue bronchodilators and supplemental oxygen  Anemia unknown etiology - Baseline ~10-11 Slight drop today 8.7 may be dilutional  Get anemia panel  FOB  Monitor CBC   Hx of CVA  On Plavix   DVT prophylaxis: Lovenox  Code Status: FULL  Family Communication: None at bedside  Disposition Plan: Home medically stable   Consultants:   None   Procedures:   None   Antimicrobials:  Vanco and Cefepime 08/26/16   Objective: Vitals:   08/26/16 2135 08/27/16 0606 08/27/16 0907 08/27/16 1430  BP: (!) 140/57 (!) 153/66  (!) 118/44  Pulse:  75 76  69  Resp: (!) 28 20  18   Temp: 99.1 F (37.3 C) 99.1 F (37.3 C)  98 F (36.7 C)  TempSrc: Oral Oral  Oral  SpO2: 96% 97% 96% 96%  Weight: 120.7 kg (266 lb 1.5 oz)     Height: 5\' 6"  (1.676 m)       Intake/Output Summary (Last 24 hours) at 08/27/16 1458 Last data filed at 08/27/16 0200  Gross per 24 hour  Intake              410 ml  Output              476 ml  Net              -66 ml   Filed Weights   08/26/16 1808 08/26/16 2135  Weight: 115.7 kg (255 lb) 120.7 kg (266 lb 1.5 oz)    Examination:  General exam: Appears calm and comfortable  Respiratory system: Decrease breath sound at the base R > L no wheezing or rales.  Cardiovascular system: S1S2 heard, RRR. No JVD, murmurs, rubs or gallops Gastrointestinal system: Abdomen is nondistended, soft and nontender. Normal bowel sounds heard. Central nervous system: Alert and oriented.  Extremities: No pedal edema.   Skin: No rashes  Data Reviewed: I have personally reviewed following labs and imaging studies  CBC:  Recent Labs Lab 08/26/16 1716 08/27/16 0528  WBC 6.7 6.8  NEUTROABS 5.8 5.5  HGB 10.2* 8.7*  HCT 28.0* 26.3*  MCV 88.1 83.2  PLT 248 [id number]   Basic Metabolic Panel:  Recent Labs Lab 08/26/16 1716 08/26/16 2047 08/27/16 0528  NA 137  --  138  K 3.9  --  3.7  CL 101  --  104  CO2 27  --  26  GLUCOSE 132*  --  111*  BUN 16  --  15  CREATININE 0.96  --  0.94  CALCIUM 9.3  --  8.7*  MG  --  1.7  --    GFR: Estimated Creatinine Clearance: 60.1 mL/min (by C-G formula based on SCr of 0.94 mg/dL). Liver Function Tests:  Recent Labs Lab 08/26/16 1716 08/27/16 0528  AST 18 14*  ALT 16 13*  ALKPHOS 77 59  BILITOT 1.2 1.1  PROT 7.0 6.1*  ALBUMIN 3.7 3.0*   No results for input(s): LIPASE, AMYLASE in the last 168 hours. No results for input(s): AMMONIA in the last 168 hours. Coagulation Profile:  Recent Labs Lab 08/26/16 1716  INR 1.26   Cardiac Enzymes: No results for input(s):  CKTOTAL, CKMB, CKMBINDEX, TROPONINI in the last 168 hours. BNP (last 3 results) No results for input(s): PROBNP in the last 8760 hours. HbA1C: No results for input(s): HGBA1C in the last 72 hours. CBG: No results for input(s): GLUCAP in the last 168 hours. Lipid Profile: No results for input(s): CHOL, HDL, LDLCALC, TRIG, CHOLHDL, LDLDIRECT in the last 72 hours. Thyroid Function Tests: No results for input(s): TSH, T4TOTAL, FREET4, T3FREE, THYROIDAB in the last 72 hours. Anemia Panel: No results for input(s): VITAMINB12, FOLATE, FERRITIN, TIBC, IRON, RETICCTPCT in the last 72 hours. Sepsis Labs:  Recent Labs Lab 08/26/16 1727 08/26/16 2056 08/27/16 0528  PROCALCITON  --   --  0.38  LATICACIDVEN 1.37 1.19  --     No results found for this or any previous visit (from the past 240 hour(s)).    Radiology Studies: Dg Chest 2 View  Result Date: 08/26/2016 CLINICAL DATA:  Shortness of breath EXAM: CHEST  2 VIEW COMPARISON:  08/15/2016 FINDINGS: Chronic cardiomegaly. Negative aortic and hilar contours. Streaky basilar opacities in generalized interstitial coarsening. Tree-in-bud opacities and advanced airway thickening with atelectasis seen on January 08, 2016 chest CT. No edema, effusion, or pneumothorax. IMPRESSION: 1. Bibasilar opacity likely related to airway disease seen on June 2017 chest CT. These changes could easily obscure superimposed bronchopneumonia. 2. Cardiomegaly without failure. Electronically Signed   By: Monte Fantasia M.D.   On: 08/26/2016 17:08    Scheduled Meds: . ceFEPime (MAXIPIME) IV  1 g Intravenous Q8H  . clopidogrel  75 mg Oral Daily  . enoxaparin (LOVENOX) injection  60 mg Subcutaneous Q24H  . fluticasone  1 spray Each Nare Daily  . ipratropium-albuterol  3 mL Nebulization TID  . pantoprazole  40 mg Oral Daily  . sodium chloride flush  3 mL Intravenous Q12H  . vancomycin  1,250 mg Intravenous Q24H  . vitamin A & D       Continuous Infusions:   LOS: 1 day     Chipper Oman, MD [facility] Pager [phone]  If 7PM-7AM, please contact night-coverage [email or web address] Password [facility] 08/27/2016, 2:58 PM

## 2016-08-27 NOTE — Procedures (Signed)
Patient does not want to wear CPAP tonight.  Patient is aware if she changes her mind to ask RN to call Respiratory.   

## 2016-08-27 NOTE — Plan of Care (Signed)
Problem: Education: Goal: Knowledge of [place] [facility] Education information/materials will improve Outcome: Completed/Met Date Met: 08/27/16 M.D. Discussed plan of care with pt

## 2016-08-27 NOTE — Care Management Note (Signed)
Case Management Note  Patient Details  Name: Natasha Ellis MRN: [id number] Date of Birth: Oct 27, 1930  Subjective/Objective:     SOB               Action/Plan:Pt from [place]   Expected Discharge Date:   (unknown)               Expected Discharge Plan:  Assisted Living / Rest Home  In-House Referral:  Clinical Social Work  Discharge planning Services  CM Consult  Post Acute Care Choice:    Choice offered to:     DME Arranged:  N/A DME Agency:  NA  HH Arranged:  PT HH Agency:   (Pt from [facility])  Status of Service:  In process, will continue to follow  If discussed at Long Length of Stay Meetings, dates discussed:    Additional CommentsPurcell Mouton, RN 08/27/2016, 3:12 PM

## 2016-08-28 DIAGNOSIS — I10 Essential (primary) hypertension: Secondary | ICD-10-CM

## 2016-08-28 LAB — IRON AND TIBC
Iron: 15 ug/dL — ABNORMAL LOW (ref 28–170)
Saturation Ratios: 8 % — ABNORMAL LOW (ref 10.4–31.8)
TIBC: 181 ug/dL — ABNORMAL LOW (ref 250–450)
UIBC: 166 ug/dL

## 2016-08-28 LAB — COMPREHENSIVE METABOLIC PANEL
ALT: 13 U/L — ABNORMAL LOW (ref 14–54)
AST: 12 U/L — ABNORMAL LOW (ref 15–41)
Albumin: 2.9 g/dL — ABNORMAL LOW (ref 3.5–5.0)
Alkaline Phosphatase: 60 U/L (ref 38–126)
Anion gap: 8 (ref 5–15)
BUN: 14 mg/dL (ref 6–20)
CO2: 26 mmol/L (ref 22–32)
Calcium: 8.4 mg/dL — ABNORMAL LOW (ref 8.9–10.3)
Chloride: 106 mmol/L (ref 101–111)
Creatinine, Ser: 0.8 mg/dL (ref 0.44–1.00)
GFR calc Af Amer: >60 mL/min (ref >60)
GFR calc non Af Amer: >60 mL/min (ref >60)
Glucose, Bld: 94 mg/dL (ref 65–99)
Potassium: 4 mmol/L (ref 3.5–5.1)
Sodium: 140 mmol/L (ref 135–145)
Total Bilirubin: 0.8 mg/dL (ref 0.3–1.2)
Total Protein: 5.8 g/dL — ABNORMAL LOW (ref 6.5–8.1)

## 2016-08-28 LAB — URINE CULTURE

## 2016-08-28 LAB — FERRITIN: Ferritin: 228 ng/mL (ref 11–307)

## 2016-08-28 LAB — CBC
HCT: 25.1 % — ABNORMAL LOW (ref 36.0–46.0)
Hemoglobin: 8.3 g/dL — ABNORMAL LOW (ref 12.0–15.0)
MCH: 28 pg (ref 26.0–34.0)
MCHC: 33.1 g/dL (ref 30.0–36.0)
MCV: 84.8 fL (ref 78.0–100.0)
Platelets: 250 10*3/uL (ref 150–400)
RBC: 2.96 MIL/uL — ABNORMAL LOW (ref 3.87–5.11)
RDW: 15.3 % (ref 11.5–15.5)
WBC: 3.8 10*3/uL — ABNORMAL LOW (ref 4.0–10.5)

## 2016-08-28 LAB — VITAMIN B12: Vitamin B-12: 406 pg/mL (ref 180–914)

## 2016-08-28 LAB — MAGNESIUM: Magnesium: 2 mg/dL (ref 1.7–2.4)

## 2016-08-28 LAB — RETICULOCYTES
RBC.: 2.96 MIL/uL — ABNORMAL LOW (ref 3.87–5.11)
Retic Count, Absolute: 35.5 10*3/uL (ref 19.0–186.0)
Retic Ct Pct: 1.2 % (ref 0.4–3.1)

## 2016-08-28 LAB — PHOSPHORUS: Phosphorus: 3.5 mg/dL (ref 2.5–4.6)

## 2016-08-28 LAB — FOLATE: Folate: 35.6 ng/mL (ref >5.9)

## 2016-08-28 MED ORDER — GUAIFENESIN-DM 100-10 MG/5ML PO SYRP: 5.0000 mL | ORAL_SOLUTION | ORAL | 0 refills | Status: DC | PRN

## 2016-08-28 MED ORDER — LACTINEX PO CHEW: 1.0000 | CHEWABLE_TABLET | Freq: Three times a day (TID) | ORAL | 0 refills | Status: DC

## 2016-08-28 MED ORDER — AMOXICILLIN-POT CLAVULANATE 875-125 MG PO TABS: 1.0000 | ORAL_TABLET | Freq: Two times a day (BID) | ORAL | 0 refills | Status: AC

## 2016-08-28 MED ORDER — FLUTICASONE PROPIONATE 50 MCG/ACT NA SUSP: 1.0000 | Freq: Every day | NASAL | 0 refills | Status: DC

## 2016-08-28 NOTE — Progress Notes (Signed)
Pt is from [facility]([place]) and request an order for PT to give to [place].

## 2016-08-28 NOTE — Discharge Summary (Signed)
Physician Discharge Summary  Natasha Ellis  [id number]  DOB: [birth year]  DOA: 08/26/2016 PCP: Irven Shelling, MD  Admit date: 08/26/2016 Discharge date: 08/28/2016  Admitted From: Home Disposition:  Home  Recommendations for Outpatient Follow-up:  1. Follow up with PCP in 1-2 weeks 2. Please obtain BMP/CBC in one week 3. Please follow up on the following pending results: Final blood cultures no growth in 2 days  Home Health: Natasha Ellis   Discharge Condition: Stable  CODE STATUS: FULL  Diet recommendation: Heart Healthy    Brief/Interim Summary: Natasha Ellis a 83 y.o.femalewith medical history significant of osteoarthritis, asthma, lymphoma, cataracts, diverticulosis, IBS, sleep apnea who comes in the emergency department with complaints of progressively worse shortness of breath since 4 days PTA. Per patient, she was seen by her pulmonologist and was given Mucinex, Delsym and albuterol., however did not provide any relief. In the ED patient was found to have fever T Max 103.4 and a CXR that showed bibasilar opacity consistent with bronchopneumonia, Oxygen sat at the high 80's and tachypnea. Patient admitted for PNA and started on IV abx, blood cultures were obtained and O2 as needed. Patient significantly improved, has been afebrile during hospital stay. Patient stable continue antibiotic treatment at home and follow up with PCP.   Subjective: Patient seen and examined at bedside, feeling great. Denies SOB, chest pain and dizziness.   Discharge Diagnoses/Hospital Course:  HCAP - recently discharge on 08/18/16 treated for flu  CXR show b/l opacities consistent with bronchopneumonia  Initially treated with vanc and cefepime  Procalcitonin 0.38 - transitioned to Augmentin for 7 days  Follow up final blood cultures - with PCP  Step pnemuo antigen negative  Flonase given for nasal congestion   HTN - stable  Not on BP meds at home other than Lasix  Monitor BP Follow up with PCP    OSA Continue CPAP   Asthma  Continue bronchodilators  Anemia iron deficiency  - Baseline ~10-11 Slight drop today 8.7 may be dilutional  Monitor CBC in 1 week  Iron supplement   Hx of CVA  On Plavix   Discharge Instructions  You were cared for by a hospitalist during your hospital stay. If you have any questions about your discharge medications or the care you received while you were in the hospital after you are discharged, you can call the unit and asked to speak with the hospitalist on call if the hospitalist that took care of you is not available. Once you are discharged, your primary care physician will handle any further medical issues. Please note that NO REFILLS for any discharge medications will be authorized once you are discharged, as it is imperative that you return to your primary care physician (or establish a relationship with a primary care physician if you do not have one) for your aftercare needs so that they can reassess your need for medications and monitor your lab values.  Discharge Instructions    Call MD for:  difficulty breathing, headache or visual disturbances    Complete by:  As directed    Call MD for:  extreme fatigue    Complete by:  As directed    Call MD for:  hives    Complete by:  As directed    Call MD for:  persistant dizziness or light-headedness    Complete by:  As directed    Call MD for:  persistant nausea and vomiting    Complete by:  As directed  Call MD for:  redness, tenderness, or signs of infection (pain, swelling, redness, odor or green/yellow discharge around incision site)    Complete by:  As directed    Call MD for:  severe uncontrolled pain    Complete by:  As directed    Call MD for:  temperature >100.4    Complete by:  As directed    Diet - low sodium heart healthy    Complete by:  As directed    Increase activity slowly    Complete by:  As directed      Allergies as of 08/28/2016      Reactions   Codeine Other  (See Comments)   Crazy-nightmares   Amoxicillin Diarrhea   Has patient had a PCN reaction causing immediate rash, facial/tongue/throat swelling, SOB or lightheadedness with hypotension:  No Has patient had a PCN reaction causing severe rash involving mucus membranes or skin necrosis: No Has patient had a PCN reaction that required hospitalization No Has patient had a PCN reaction occurring within the last 10 years: Unknown--rx is diarrhea. If all of the above answers are "NO", then may proceed with Cephalosporin use.   Cefdinir Cough   Doxycycline Diarrhea   Erythromycin Other (See Comments)   GI upset   Lisinopril Cough   Lisinopril    unknown   Oxybutynin Other (See Comments)   Dry mouth   Sulfa Antibiotics Other (See Comments)   GI upset   Sulfacetamide Sodium Other (See Comments)   GI upset      Medication List    STOP taking these medications   DELSYM 30 MG/5ML liquid Generic drug:  dextromethorphan   diphenoxylate-atropine 2.5-0.025 MG tablet Commonly known as:  LOMOTIL   guaiFENesin 600 MG 12 hr tablet Commonly known as:  MUCINEX   ibuprofen 200 MG tablet Commonly known as:  ADVIL,MOTRIN   oseltamivir 75 MG capsule Commonly known as:  TAMIFLU     TAKE these medications   acetaminophen 500 MG tablet Commonly known as:  TYLENOL Take 1,000 mg by mouth every 6 (six) hours as needed for pain.   albuterol (2.5 MG/3ML) 0.083% nebulizer solution Commonly known as:  PROVENTIL Take 3 mLs (2.5 mg total) by nebulization every 4 (four) hours as needed for wheezing or shortness of breath.   albuterol 108 (90 Base) MCG/ACT inhaler Commonly known as:  PROAIR HFA Inhale 2 puffs into the lungs every 6 (six) hours as needed for wheezing or shortness of breath.   alendronate 70 MG tablet Commonly known as:  FOSAMAX Take 70 mg by mouth once a week. Sundays   amoxicillin-clavulanate 875-125 MG tablet Commonly known as:  AUGMENTIN Take 1 tablet by mouth 2 (two) times  daily.   b complex vitamins capsule Take 1 capsule by mouth daily.   calcium gluconate 500 MG tablet Take 1 tablet by mouth daily.   clopidogrel 75 MG tablet Commonly known as:  PLAVIX Take 75 mg by mouth daily.   ferrous sulfate 325 (65 FE) MG tablet Take 325 mg by mouth daily.   fluticasone 50 MCG/ACT nasal spray Commonly known as:  FLONASE Place 1 spray into both nostrils daily. Start taking on:  08/29/2016   furosemide 20 MG tablet Commonly known as:  LASIX Take 1 tablet (20 mg total) by mouth daily.   guaiFENesin-dextromethorphan 100-10 MG/5ML syrup Commonly known as:  ROBITUSSIN DM Take 5 mLs by mouth every 4 (four) hours as needed for cough.   lactobacillus acidophilus & bulgar chewable tablet  Chew 1 tablet by mouth 3 (three) times daily with meals.   potassium chloride 10 MEQ tablet Commonly known as:  K-DUR Take 10 mEq by mouth daily.   sodium chloride 0.65 % Soln nasal spray Commonly known as:  OCEAN Place 1 spray into both nostrils 3 (three) times daily as needed for congestion.       Allergies  Allergen Reactions  . Codeine Other (See Comments)    Crazy-nightmares  . Amoxicillin Diarrhea    Has patient had a PCN reaction causing immediate rash, facial/tongue/throat swelling, SOB or lightheadedness with hypotension:  No Has patient had a PCN reaction causing severe rash involving mucus membranes or skin necrosis: No Has patient had a PCN reaction that required hospitalization No Has patient had a PCN reaction occurring within the last 10 years: Unknown--rx is diarrhea. If all of the above answers are "NO", then may proceed with Cephalosporin use.    . Cefdinir Cough  . Doxycycline Diarrhea  . Erythromycin Other (See Comments)    GI upset  . Lisinopril Cough  . Lisinopril     unknown  . Oxybutynin Other (See Comments)    Dry mouth  . Sulfa Antibiotics Other (See Comments)    GI upset  . Sulfacetamide Sodium Other (See Comments)    GI upset     Consultations:  None   Procedures/Studies: Dg Chest 2 View  Result Date: 08/26/2016 CLINICAL DATA:  Shortness of breath EXAM: CHEST  2 VIEW COMPARISON:  08/15/2016 FINDINGS: Chronic cardiomegaly. Negative aortic and hilar contours. Streaky basilar opacities in generalized interstitial coarsening. Tree-in-bud opacities and advanced airway thickening with atelectasis seen on January 08, 2016 chest CT. No edema, effusion, or pneumothorax. IMPRESSION: 1. Bibasilar opacity likely related to airway disease seen on June 2017 chest CT. These changes could easily obscure superimposed bronchopneumonia. 2. Cardiomegaly without failure. Electronically Signed   By: Monte Fantasia M.D.   On: 08/26/2016 17:08   Dg Chest 2 View  Result Date: 08/15/2016 CLINICAL DATA:  Congestive cough with weakness and shortness of Breath starting this morning. EXAM: CHEST  2 VIEW COMPARISON:  02/14/2016 FINDINGS: AP and lateral views of the chest show low volumes. The cardio pericardial silhouette is enlarged. There is pulmonary vascular congestion without overt pulmonary edema. Interstitial markings are diffusely coarsened with chronic features. Right infrahilar airspace disease may be atelectatic although pneumonia could have this appearance. The visualized bony structures of the thorax are intact. Telemetry leads overlie the chest. IMPRESSION: Cardiomegaly with vascular congestion and underlying chronic interstitial changes. Right infrahilar airspace disease.  Atelectasis versus pneumonia. Electronically Signed   By: Misty Stanley M.D.   On: 08/15/2016 18:11     Discharge Exam: Vitals:   08/27/16 2138 08/28/16 0551  BP: (!) 132/55 (!) 148/75  Pulse: 71 79  Resp: 20 20  Temp: 98.7 F (37.1 C) 98.5 F (36.9 C)   Vitals:   08/27/16 2022 08/27/16 2138 08/28/16 0551 08/28/16 0811  BP:  (!) 132/55 (!) 148/75   Pulse: 72 71 79   Resp: 18 20 20    Temp:  98.7 F (37.1 C) 98.5 F (36.9 C)   TempSrc:  Oral Oral    SpO2: 96% 97% 95% 92%  Weight:      Height:        General: Natasha Ellis is alert, awake, not in acute distress Cardiovascular: RRR, S1/S2 +, no rubs, no gallops Respiratory: CTA bilaterally, no wheezing, no rhonchi Abdominal: Soft, NT, ND, bowel sounds + Extremities: no  edema, no cyanosis   The results of significant diagnostics from this hospitalization (including imaging, microbiology, ancillary and laboratory) are listed below for reference.     Microbiology: Recent Results (from the past 240 hour(s))  Culture, blood (Routine x 2)     Status: None (Preliminary result)   Collection Time: 08/26/16  5:16 PM  Result Value Ref Range Status   Specimen Description BLOOD RIGHT ANTECUBITAL  Final   Special Requests BOTTLES DRAWN AEROBIC AND ANAEROBIC 5CC  Final   Culture   Final    NO GROWTH 2 DAYS Performed at [facility], [address], [place], [place] [address]    Report Status PENDING  Incomplete  Culture, blood (Routine x 2)     Status: None (Preliminary result)   Collection Time: 08/26/16  5:19 PM  Result Value Ref Range Status   Specimen Description BLOOD LEFT ANTECUBITAL  Final   Special Requests BOTTLES DRAWN AEROBIC AND ANAEROBIC 5CC  Final   Culture   Final    NO GROWTH 2 DAYS Performed at [facility], [place] [address], [place], [place] [address]    Report Status PENDING  Incomplete  Urine culture     Status: Abnormal   Collection Time: 08/26/16  5:23 PM  Result Value Ref Range Status   Specimen Description URINE, CLEAN CATCH  Final   Special Requests NONE  Final   Culture MULTIPLE SPECIES PRESENT, SUGGEST RECOLLECTION (A)  Final   Report Status 08/28/2016 FINAL  Final  Culture, sputum-assessment     Status: None   Collection Time: 08/26/16  9:47 PM  Result Value Ref Range Status   Specimen Description SPUTUM  Final   Special Requests Immunocompromised  Final   Sputum evaluation THIS SPECIMEN IS ACCEPTABLE FOR SPUTUM CULTURE  Final   Report Status  08/27/2016 FINAL  Final  Culture, respiratory (NON-Expectorated)     Status: None (Preliminary result)   Collection Time: 08/26/16  9:47 PM  Result Value Ref Range Status   Specimen Description SPUTUM  Final   Special Requests Immunocompromised Reflexed from [id number]  Final   Gram Stain   Final    MODERATE WBC PRESENT, PREDOMINANTLY PMN FEW GRAM POSITIVE RODS    Culture   Final    TOO YOUNG TO READ Performed at [facility], [place] [address], [place], [place] [address]    Report Status PENDING  Incomplete     Labs: BNP (last 3 results) No results for input(s): BNP in the last 8760 hours. Basic Metabolic Panel:  Recent Labs Lab 08/26/16 1716 08/26/16 2047 08/27/16 0528 08/28/16 0534  NA 137  --  138 140  K 3.9  --  3.7 4.0  CL 101  --  104 106  CO2 27  --  26 26  GLUCOSE 132*  --  111* 94  BUN 16  --  15 14  CREATININE 0.96  --  0.94 0.80  CALCIUM 9.3  --  8.7* 8.4*  MG  --  1.7  --  2.0  PHOS  --   --   --  3.5   Liver Function Tests:  Recent Labs Lab 08/26/16 1716 08/27/16 0528 08/28/16 0534  AST 18 14* 12*  ALT 16 13* 13*  ALKPHOS 77 59 60  BILITOT 1.2 1.1 0.8  PROT 7.0 6.1* 5.8*  ALBUMIN 3.7 3.0* 2.9*   No results for input(s): LIPASE, AMYLASE in the last 168 hours. No results for input(s): AMMONIA in  the last 168 hours. CBC:  Recent Labs Lab 08/26/16 1716 08/27/16 0528 08/28/16 0534  WBC 6.7 6.8 3.8*  NEUTROABS 5.8 5.5  --   HGB 10.2* 8.7* 8.3*  HCT 28.0* 26.3* 25.1*  MCV 88.1 83.2 84.8  PLT 248 247 250   Cardiac Enzymes: No results for input(s): CKTOTAL, CKMB, CKMBINDEX, TROPONINI in the last 168 hours. BNP: Invalid input(s): POCBNP CBG: No results for input(s): GLUCAP in the last 168 hours. D-Dimer No results for input(s): DDIMER in the last 72 hours. Hgb A1c No results for input(s): HGBA1C in the last 72 hours. Lipid Profile No results for input(s): CHOL, HDL, LDLCALC, TRIG, CHOLHDL, LDLDIRECT in the last 72 hours. Thyroid  function studies No results for input(s): TSH, T4TOTAL, T3FREE, THYROIDAB in the last 72 hours.  Invalid input(s): FREET3 Anemia work up  Recent Labs  08/28/16 0534  VITAMINB12 406  FOLATE 35.6  FERRITIN 228  TIBC 181*  IRON 15*  RETICCTPCT 1.2   Urinalysis    Component Value Date/Time   COLORURINE AMBER (A) 08/26/2016 1723   APPEARANCEUR HAZY (A) 08/26/2016 1723   LABSPEC 1.013 08/26/2016 1723   PHURINE 7.0 08/26/2016 1723   GLUCOSEU NEGATIVE 08/26/2016 1723   HGBUR NEGATIVE 08/26/2016 1723   BILIRUBINUR NEGATIVE 08/26/2016 1723   BILIRUBINUR neg 04/20/2013 1728   KETONESUR NEGATIVE 08/26/2016 1723   PROTEINUR 100 (A) 08/26/2016 1723   UROBILINOGEN 0.2 05/21/2015 1920   NITRITE NEGATIVE 08/26/2016 1723   LEUKOCYTESUR NEGATIVE 08/26/2016 1723   Sepsis Labs Invalid input(s): PROCALCITONIN,  WBC,  LACTICIDVEN Microbiology Recent Results (from the past 240 hour(s))  Culture, blood (Routine x 2)     Status: None (Preliminary result)   Collection Time: 08/26/16  5:16 PM  Result Value Ref Range Status   Specimen Description BLOOD RIGHT ANTECUBITAL  Final   Special Requests BOTTLES DRAWN AEROBIC AND ANAEROBIC 5CC  Final   Culture   Final    NO GROWTH 2 DAYS Performed at [facility], [place] [address], [place], [place] [address]    Report Status PENDING  Incomplete  Culture, blood (Routine x 2)     Status: None (Preliminary result)   Collection Time: 08/26/16  5:19 PM  Result Value Ref Range Status   Specimen Description BLOOD LEFT ANTECUBITAL  Final   Special Requests BOTTLES DRAWN AEROBIC AND ANAEROBIC 5CC  Final   Culture   Final    NO GROWTH 2 DAYS Performed at [facility], [place] [address], [place], [place] [address]    Report Status PENDING  Incomplete  Urine culture     Status: Abnormal   Collection Time: 08/26/16  5:23 PM  Result Value Ref Range Status   Specimen Description URINE, CLEAN CATCH  Final   Special Requests NONE  Final   Culture  MULTIPLE SPECIES PRESENT, SUGGEST RECOLLECTION (A)  Final   Report Status 08/28/2016 FINAL  Final  Culture, sputum-assessment     Status: None   Collection Time: 08/26/16  9:47 PM  Result Value Ref Range Status   Specimen Description SPUTUM  Final   Special Requests Immunocompromised  Final   Sputum evaluation THIS SPECIMEN IS ACCEPTABLE FOR SPUTUM CULTURE  Final   Report Status 08/27/2016 FINAL  Final  Culture, respiratory (NON-Expectorated)     Status: None (Preliminary result)   Collection Time: 08/26/16  9:47 PM  Result Value Ref Range Status   Specimen Description SPUTUM  Final   Special Requests Immunocompromised Reflexed from [id number]  Final   Gram Stain   Final    MODERATE WBC PRESENT, PREDOMINANTLY PMN FEW GRAM POSITIVE RODS    Culture   Final    TOO YOUNG TO READ Performed at [facility], [place] [address], [place], [place] [address]    Report Status PENDING  Incomplete     Time coordinating discharge: Over 30 minutes  SIGNED:  Chipper Oman, MD  [facility] 08/28/2016, 8:19 PM Pager   If 7PM-7AM, please contact night-coverage [email or web address] Password [email or web address]

## 2016-08-28 NOTE — Evaluation (Signed)
Physical Therapy Evaluation Patient Details Name: MINETTA KEPHART MRN: [id number] DOB: 11-20-1930 Today's Date: 08/28/2016   History of Present Illness  83 yo female admitted with Pna. Hx of NHL, HTN, asthma, OA, recent flu, , obesity  Clinical Impression  On eval, pt required Min guard assist for mobility. She walked ~150 feet with a RW. Dyspnea 2/4 with ambulation-1 standing rest break needed. Pt tolerated activity fairly well. Recommend HHPT follow [facility] at [facility]     Follow Up Recommendations Home health PT    Equipment Recommendations  None recommended by PT    Recommendations for Other Services       Precautions / Restrictions Precautions Precautions: Fall Restrictions Weight Bearing Restrictions: No      Mobility  Bed Mobility               General bed mobility comments: OOB in recliner  Transfers Overall transfer level: Needs assistance Equipment used: Rolling walker (2 wheeled) Transfers: Sit to/from Stand Sit to Stand: Min guard         General transfer comment: pt uses rocking/momentum to stand. close guard for safety.   Ambulation/Gait Ambulation/Gait assistance: Min guard Ambulation Distance (Feet): 150 Feet Assistive device: Rolling walker (2 wheeled) Gait Pattern/deviations: Step-through pattern;Decreased stride length     General Gait Details: cues for standing rests d/t DOE. close guard for safety. dyspnea 2/4   Stairs            Wheelchair Mobility    Modified Rankin (Stroke Patients Only)       Balance                                             Pertinent Vitals/Pain Pain Assessment: No/denies pain    Home Living Family/patient expects to be discharged to:: Private residence Living Arrangements: Alone   Type of Home: Apartment Home Access: Level entry     Home Layout: One level Home Equipment: Environmental consultant - 2 wheels;Cane - single point;Walker - 4 wheels Additional Comments: lives in  [facility] at [place]    Prior Function Level of Independence: Independent with assistive device(s)         Comments: using mostly her cane to amb      Hand Dominance        Extremity/Trunk Assessment   Upper Extremity Assessment Upper Extremity Assessment: Overall WFL for tasks assessed    Lower Extremity Assessment Lower Extremity Assessment: Generalized weakness    Cervical / Trunk Assessment Cervical / Trunk Assessment: Normal  Communication   Communication: No difficulties  Cognition Arousal/Alertness: Awake/alert Behavior During Therapy: WFL for tasks assessed/performed Overall Cognitive Status: Within Functional Limits for tasks assessed                      General Comments      Exercises     Assessment/Plan    PT Assessment Patient needs continued PT services  PT Problem List Decreased strength;Decreased mobility;Decreased activity tolerance;Decreased balance;Decreased knowledge of use of DME          PT Treatment Interventions DME instruction;Gait training;Therapeutic activities;Therapeutic exercise;Patient/family education;Functional mobility training;Balance training    PT Goals (Current goals can be found in the Care Plan section)  Acute Rehab PT Goals Patient Stated Goal: home to [facility] PT Goal Formulation: With patient Time For Goal Achievement: 09/11/16 Potential to Achieve  Goals: Good    Frequency Min 3X/week   Barriers to discharge        Co-evaluation               End of Session Equipment Utilized During Treatment: Gait belt Activity Tolerance: Patient tolerated treatment well Patient left: in chair;with call bell/phone within reach;with chair alarm set           Time: [id number] PT Time Calculation (min) (ACUTE ONLY): 15 min   Charges:   PT Evaluation $PT Eval Low Complexity: 1 Procedure     PT G Codes:        Weston Anna, MPT Pager: [phone]

## 2016-08-30 LAB — CULTURE, RESPIRATORY W GRAM STAIN: Culture: NORMAL

## 2016-08-30 LAB — CULTURE, RESPIRATORY

## 2016-08-31 ENCOUNTER — Telehealth: Payer: Self-pay | Admitting: Pulmonary Disease

## 2016-08-31 LAB — CULTURE, BLOOD (ROUTINE X 2)
Culture: NO GROWTH
Culture: NO GROWTH

## 2016-08-31 NOTE — Telephone Encounter (Signed)
Spoke with pt. And made her aware of RA recc. PT. Had no further questions Nothing further is needed at this time.

## 2016-08-31 NOTE — Telephone Encounter (Signed)
Patient was D/C from hospital last Friday 08/28/16. She has an appt with Dr. Laurann Montana on Wednesday 09/02/16. She was confused about her medications Delsym, Mucinex, and Robitussin DM.   She wanted to know why the hospital told her to stop taking the Delsym and Mucinex when she has been taking them for years. They want her to take Robitussin instead. Explained to patient that Robitussin contained Mucinex.   She wanted me to let you know. She has a follow up appt with Dr.Griffin on Wednesday.

## 2016-08-31 NOTE — Telephone Encounter (Signed)
Robitussin is the same as Mucinex Take one or the other Delsym as a cough suppressant as we discussed

## 2016-09-01 DIAGNOSIS — L603 Nail dystrophy: Secondary | ICD-10-CM | POA: Diagnosis not present

## 2016-09-01 DIAGNOSIS — B351 Tinea unguium: Secondary | ICD-10-CM | POA: Diagnosis not present

## 2016-09-02 DIAGNOSIS — J189 Pneumonia, unspecified organism: Secondary | ICD-10-CM | POA: Diagnosis not present

## 2016-09-14 DIAGNOSIS — R262 Difficulty in walking, not elsewhere classified: Secondary | ICD-10-CM | POA: Diagnosis not present

## 2016-09-14 DIAGNOSIS — J189 Pneumonia, unspecified organism: Secondary | ICD-10-CM | POA: Diagnosis not present

## 2016-09-14 DIAGNOSIS — M170 Bilateral primary osteoarthritis of knee: Secondary | ICD-10-CM | POA: Diagnosis not present

## 2016-09-17 DIAGNOSIS — R262 Difficulty in walking, not elsewhere classified: Secondary | ICD-10-CM | POA: Diagnosis not present

## 2016-09-17 DIAGNOSIS — J189 Pneumonia, unspecified organism: Secondary | ICD-10-CM | POA: Diagnosis not present

## 2016-09-17 DIAGNOSIS — M170 Bilateral primary osteoarthritis of knee: Secondary | ICD-10-CM | POA: Diagnosis not present

## 2016-09-21 DIAGNOSIS — R262 Difficulty in walking, not elsewhere classified: Secondary | ICD-10-CM | POA: Diagnosis not present

## 2016-09-21 DIAGNOSIS — M170 Bilateral primary osteoarthritis of knee: Secondary | ICD-10-CM | POA: Diagnosis not present

## 2016-09-21 DIAGNOSIS — J189 Pneumonia, unspecified organism: Secondary | ICD-10-CM | POA: Diagnosis not present

## 2016-09-22 ENCOUNTER — Ambulatory Visit (HOSPITAL_BASED_OUTPATIENT_CLINIC_OR_DEPARTMENT_OTHER): Payer: Medicare Other | Admitting: Oncology

## 2016-09-22 ENCOUNTER — Other Ambulatory Visit (HOSPITAL_BASED_OUTPATIENT_CLINIC_OR_DEPARTMENT_OTHER): Payer: Medicare Other

## 2016-09-22 ENCOUNTER — Telehealth: Payer: Self-pay | Admitting: Oncology

## 2016-09-22 VITALS — BP 140/90 | HR 76 | Temp 98.1°F | Resp 19 | Ht 66.0 in | Wt 263.4 lb

## 2016-09-22 DIAGNOSIS — D649 Anemia, unspecified: Secondary | ICD-10-CM

## 2016-09-22 DIAGNOSIS — C880 Waldenstrom macroglobulinemia: Secondary | ICD-10-CM

## 2016-09-22 DIAGNOSIS — R509 Fever, unspecified: Secondary | ICD-10-CM

## 2016-09-22 LAB — CBC WITH DIFFERENTIAL/PLATELET
BASO%: 0.5 % (ref 0.0–2.0)
Basophils Absolute: 0 10*3/uL (ref 0.0–0.1)
EOS%: 2.3 % (ref 0.0–7.0)
Eosinophils Absolute: 0.1 10*3/uL (ref 0.0–0.5)
HCT: 29 % — ABNORMAL LOW (ref 34.8–46.6)
HGB: 9.3 g/dL — ABNORMAL LOW (ref 11.6–15.9)
LYMPH%: 16.8 % (ref 14.0–49.7)
MCH: 27.4 pg (ref 25.1–34.0)
MCHC: 32.1 g/dL (ref 31.5–36.0)
MCV: 85.3 fL (ref 79.5–101.0)
MONO#: 0.4 10*3/uL (ref 0.1–0.9)
MONO%: 5.8 % (ref 0.0–14.0)
NEUT#: 4.6 10*3/uL (ref 1.5–6.5)
NEUT%: 74.6 % (ref 38.4–76.8)
Platelets: 289 10*3/uL (ref 145–400)
RBC: 3.4 10*6/uL — ABNORMAL LOW (ref 3.70–5.45)
RDW: 16.8 % — ABNORMAL HIGH (ref 11.2–14.5)
WBC: 6.2 10*3/uL (ref 3.9–10.3)
lymph#: 1 10*3/uL (ref 0.9–3.3)
nRBC: 0 % (ref 0–0)

## 2016-09-22 LAB — COMPREHENSIVE METABOLIC PANEL
ALT: 7 U/L (ref 0–55)
AST: 9 U/L (ref 5–34)
Albumin: 3 g/dL — ABNORMAL LOW (ref 3.5–5.0)
Alkaline Phosphatase: 71 U/L (ref 40–150)
Anion Gap: 9 mEq/L (ref 3–11)
BUN: 13.6 mg/dL (ref 7.0–26.0)
CO2: 26 mEq/L (ref 22–29)
Calcium: 9.1 mg/dL (ref 8.4–10.4)
Chloride: 107 mEq/L (ref 98–109)
Creatinine: 0.9 mg/dL (ref 0.6–1.1)
EGFR: 63 mL/min/{1.73_m2} — ABNORMAL LOW (ref >90)
Glucose: 89 mg/dl (ref 70–140)
Potassium: 4 mEq/L (ref 3.5–5.1)
Sodium: 141 mEq/L (ref 136–145)
Total Bilirubin: 0.56 mg/dL (ref 0.20–1.20)
Total Protein: 6.7 g/dL (ref 6.4–8.3)

## 2016-09-22 NOTE — Telephone Encounter (Signed)
Appointments scheduled per 2/20 LOS. Patient given AVS report and calendars with future scheduled appointments. °

## 2016-09-22 NOTE — Progress Notes (Signed)
  [facility] OFFICE PROGRESS NOTE   Diagnosis: Waldenstrm's macroglobulinemia  INTERVAL HISTORY:   Natasha Ellis returns as scheduled. She was admitted in January with influenza and again in January with pneumonia. She continues to feel "weak "following hospital admission. She completed an outpatient course of Augmentin after being discharged 08/28/2016. She continues to have a low-grade fever and chills in the evening. Her appetite is improving.  Objective:  Vital signs in last 24 hours:  Blood pressure 140/90, pulse 76, temperature 98.1 F (36.7 C), temperature source Oral, resp. rate 19, height 5\' 6"  (1.676 m), weight 263 lb 6.4 oz (119.5 kg), SpO2 98 %.    HEENT: Neck without mass Lymphatics: No cervical, supraclavicular, or axillary nodes Resp: Decreased breath sounds with inspiratory rhonchi at the lower posterior chest bilaterally, no respiratory distress Cardio: Regular rate and rhythm GI: No hepatosplenomegaly Vascular: Trace edema at the lower leg and ankle bilaterally   Lab Results:  Lab Results  Component Value Date   WBC 6.2 09/22/2016   HGB 9.3 (L) 09/22/2016   HCT 29.0 (L) 09/22/2016   MCV 85.3 09/22/2016   PLT 289 09/22/2016   NEUTROABS 4.6 09/22/2016   06/22/2016: [name]-1640  Medications: I have reviewed the patient's current medications.  1. 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 Treated with multiple systemic therapies includingpentostatin/Cytoxan/rituximab in 2010.   Cycle 1 bendamustine/Rituxan 05/31/2014.  Cycle 2 bendamustine/Rituxan 07/02/2014  Cycle 3 bendamustine/rituximab 07/30/2014  Cycle 4 bendamustine/rituximab 09/10/2014  Cycle 5 bendamustine/rituximab 10/22/2014  Cycle 6 bendamustine/rituximab 12/10/2014 2. Left knee arthritis. Followed  by Dr. Wynelle Link. 3. Sleep apnea. 4. Anemia.  microcytic with peripheral blood smear findings consistent with iron deficiency 07/09/2015, serum iron studies consistent with "anemia of chronic disease ", stool Hemoccult cards negative 07/11/2015 5. Question left parotitis 12/12/2013 presenting with left facial and parotid edema. 6. Admission with pneumonia February 2016 7. History of Neutropenia secondary to chemotherapy, now with moderate neutropenia 8. Left lung pneumonia 04/02/2015, sputum culture positive for Moraxella 06/19/2015 9. Low IgG and IgA 10. CT chest 05/13/2015 with bilateral lower lung consolidation 11. CT chest, abdomen, and pelvis 01/08/2016-new peribronchial thickening and peripheral tree in bud pattern at the right lower lobe, no lymphadenopathy 12. Sinus mucosal thickening/opacification on CT 08/16/2015-status post sinus surgery 12/05/2015 13. Sputum culture positive for pseudomonas aeruginosa on 01/10/2016-treated with 10 days of ciprofloxacin 14. 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 Admission with pneumonia 08/26/2016     Disposition:  Ms. Sedberry is recovering after an admission last month with pneumonia. She has persistent anemia, though the hemoglobin has improved compared to hospital discharge. She will continue iron. The anemia is in part related to [facility] macroglobulinemia. The [name] level was lower in November 2017. The low-grade fever could be related to the lymphoma versus inflammation in the lungs.  We will follow-up on the [name] level from today. She will return for an office and lab visit in 3 months. We will consider treatment of the Waldenstrm's if she develops for aggressive anemia or persistent unexplained fever.    [name], MD  09/22/2016  1:54 PM

## 2016-09-23 LAB — IGM: IgM, Qn, Serum: 1448 mg/dL — ABNORMAL HIGH (ref 26–217)

## 2016-09-24 LAB — PROTEIN ELECTROPHORESIS, SERUM
A/G Ratio: 1 (ref 0.7–1.7)
Albumin: 3.1 g/dL (ref 2.9–4.4)
Alpha 1: 0.4 g/dL (ref 0.0–0.4)
Alpha 2: 0.9 g/dL (ref 0.4–1.0)
Beta: 0.8 g/dL (ref 0.7–1.3)
Gamma Globulin: 1.2 g/dL (ref 0.4–1.8)
Globulin, Total: 3.2 (ref 2.2–3.9)
M-Spike, %: 1 g/dL — ABNORMAL HIGH
Total Protein: 6.3 g/dL (ref 6.0–8.5)

## 2016-09-25 ENCOUNTER — Telehealth: Payer: Self-pay | Admitting: Pulmonary Disease

## 2016-09-25 NOTE — Telephone Encounter (Signed)
Called and spoke with heather at [facility] and she stated that they have ordered the XL headgear.  They are expecting this to be in early next week and then once this comes in they will call the pt.     Called and spoke with pt and she is aware and nothing further is needed.

## 2016-09-28 DIAGNOSIS — R262 Difficulty in walking, not elsewhere classified: Secondary | ICD-10-CM | POA: Diagnosis not present

## 2016-09-28 DIAGNOSIS — M170 Bilateral primary osteoarthritis of knee: Secondary | ICD-10-CM | POA: Diagnosis not present

## 2016-09-28 DIAGNOSIS — J189 Pneumonia, unspecified organism: Secondary | ICD-10-CM | POA: Diagnosis not present

## 2016-10-01 DIAGNOSIS — R262 Difficulty in walking, not elsewhere classified: Secondary | ICD-10-CM | POA: Diagnosis not present

## 2016-10-01 DIAGNOSIS — M170 Bilateral primary osteoarthritis of knee: Secondary | ICD-10-CM | POA: Diagnosis not present

## 2016-10-01 DIAGNOSIS — J189 Pneumonia, unspecified organism: Secondary | ICD-10-CM | POA: Diagnosis not present

## 2016-10-06 DIAGNOSIS — R262 Difficulty in walking, not elsewhere classified: Secondary | ICD-10-CM | POA: Diagnosis not present

## 2016-10-06 DIAGNOSIS — M170 Bilateral primary osteoarthritis of knee: Secondary | ICD-10-CM | POA: Diagnosis not present

## 2016-10-06 DIAGNOSIS — J189 Pneumonia, unspecified organism: Secondary | ICD-10-CM | POA: Diagnosis not present

## 2016-10-08 DIAGNOSIS — J189 Pneumonia, unspecified organism: Secondary | ICD-10-CM | POA: Diagnosis not present

## 2016-10-08 DIAGNOSIS — R262 Difficulty in walking, not elsewhere classified: Secondary | ICD-10-CM | POA: Diagnosis not present

## 2016-10-08 DIAGNOSIS — M170 Bilateral primary osteoarthritis of knee: Secondary | ICD-10-CM | POA: Diagnosis not present

## 2016-10-13 DIAGNOSIS — R262 Difficulty in walking, not elsewhere classified: Secondary | ICD-10-CM | POA: Diagnosis not present

## 2016-10-13 DIAGNOSIS — M170 Bilateral primary osteoarthritis of knee: Secondary | ICD-10-CM | POA: Diagnosis not present

## 2016-10-13 DIAGNOSIS — J189 Pneumonia, unspecified organism: Secondary | ICD-10-CM | POA: Diagnosis not present

## 2016-10-15 DIAGNOSIS — M170 Bilateral primary osteoarthritis of knee: Secondary | ICD-10-CM | POA: Diagnosis not present

## 2016-10-15 DIAGNOSIS — J189 Pneumonia, unspecified organism: Secondary | ICD-10-CM | POA: Diagnosis not present

## 2016-10-15 DIAGNOSIS — R262 Difficulty in walking, not elsewhere classified: Secondary | ICD-10-CM | POA: Diagnosis not present

## 2016-10-19 DIAGNOSIS — J189 Pneumonia, unspecified organism: Secondary | ICD-10-CM | POA: Diagnosis not present

## 2016-10-19 DIAGNOSIS — M170 Bilateral primary osteoarthritis of knee: Secondary | ICD-10-CM | POA: Diagnosis not present

## 2016-10-19 DIAGNOSIS — R262 Difficulty in walking, not elsewhere classified: Secondary | ICD-10-CM | POA: Diagnosis not present

## 2016-10-22 DIAGNOSIS — M170 Bilateral primary osteoarthritis of knee: Secondary | ICD-10-CM | POA: Diagnosis not present

## 2016-10-22 DIAGNOSIS — J189 Pneumonia, unspecified organism: Secondary | ICD-10-CM | POA: Diagnosis not present

## 2016-10-22 DIAGNOSIS — R262 Difficulty in walking, not elsewhere classified: Secondary | ICD-10-CM | POA: Diagnosis not present

## 2016-10-28 ENCOUNTER — Ambulatory Visit (INDEPENDENT_AMBULATORY_CARE_PROVIDER_SITE_OTHER): Payer: Medicare Other | Admitting: Neurology

## 2016-10-28 ENCOUNTER — Encounter: Payer: Self-pay | Admitting: Neurology

## 2016-10-28 VITALS — BP 118/70 | HR 69 | Ht 66.0 in | Wt 269.6 lb

## 2016-10-28 DIAGNOSIS — G3184 Mild cognitive impairment, so stated: Secondary | ICD-10-CM | POA: Diagnosis not present

## 2016-10-28 DIAGNOSIS — I638 Other cerebral infarction: Secondary | ICD-10-CM | POA: Diagnosis not present

## 2016-10-28 DIAGNOSIS — I6389 Other cerebral infarction: Secondary | ICD-10-CM
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
## 2016-10-28 NOTE — Patient Instructions (Addendum)
Continue plavix 75mg  daily  Return to clinic in 6 months

## 2016-11-19 ENCOUNTER — Other Ambulatory Visit: Payer: Self-pay | Admitting: Internal Medicine

## 2016-11-19 ENCOUNTER — Ambulatory Visit
Admission: RE | Admit: 2016-11-19 | Discharge: 2016-11-19 | Disposition: A | Discharge status: Home / Self Care | Payer: Medicare Other | Source: Ambulatory Visit | Attending: Internal Medicine | Admitting: Internal Medicine

## 2016-11-19 DIAGNOSIS — R0600 Dyspnea, unspecified: Secondary | ICD-10-CM

## 2016-11-19 DIAGNOSIS — R0609 Other forms of dyspnea: Principal | ICD-10-CM

## 2016-11-19 DIAGNOSIS — R609 Edema, unspecified: Secondary | ICD-10-CM | POA: Diagnosis not present

## 2016-11-19 DIAGNOSIS — R05 Cough: Secondary | ICD-10-CM | POA: Diagnosis not present

## 2016-11-19 DIAGNOSIS — R0602 Shortness of breath: Secondary | ICD-10-CM | POA: Diagnosis not present

## 2016-12-01 DIAGNOSIS — G4733 Obstructive sleep apnea (adult) (pediatric): Secondary | ICD-10-CM | POA: Diagnosis not present

## 2016-12-01 DIAGNOSIS — M170 Bilateral primary osteoarthritis of knee: Secondary | ICD-10-CM | POA: Diagnosis not present

## 2016-12-01 DIAGNOSIS — Z6841 Body Mass Index (BMI) 40.0 and over, adult: Secondary | ICD-10-CM | POA: Diagnosis not present

## 2016-12-01 DIAGNOSIS — M8580 Other specified disorders of bone density and structure, unspecified site: Secondary | ICD-10-CM | POA: Diagnosis not present

## 2016-12-08 ENCOUNTER — Ambulatory Visit (INDEPENDENT_AMBULATORY_CARE_PROVIDER_SITE_OTHER): Payer: Medicare Other | Admitting: Pulmonary Disease

## 2016-12-08 ENCOUNTER — Encounter: Payer: Self-pay | Admitting: Pulmonary Disease

## 2016-12-08 VITALS — BP 120/68 | HR 69 | Ht 66.0 in | Wt 265.0 lb

## 2016-12-08 DIAGNOSIS — J189 Pneumonia, unspecified organism: Secondary | ICD-10-CM

## 2016-12-08 DIAGNOSIS — J4520 Mild intermittent asthma, uncomplicated: Secondary | ICD-10-CM

## 2016-12-08 DIAGNOSIS — G4733 Obstructive sleep apnea (adult) (pediatric): Secondary | ICD-10-CM

## 2016-12-08 DIAGNOSIS — I638 Other cerebral infarction: Secondary | ICD-10-CM | POA: Diagnosis not present

## 2016-12-08 NOTE — Patient Instructions (Signed)
Continue current CPAP settings. We will change her DME  Continue albuterol nebs once daily  Call us if breathing gets worse

## 2016-12-08 NOTE — Assessment & Plan Note (Signed)
Continue current CPAP settings. We will change her DME  Weight loss encouraged, compliance with goal of at least 4-6 hrs every night is the expectation. Advised against medications with sedative side effects Cautioned against driving when sleepy - understanding that sleepiness will vary on a day to day basis

## 2016-12-08 NOTE — Addendum Note (Signed)
Addended by: Valerie Salts on: 12/08/2016 03:40 PM   Modules accepted: Orders

## 2016-12-08 NOTE — Progress Notes (Signed)
   Subjective:    Patient ID: Natasha Ellis, female    DOB: 03-23-1931, 83 y.o.   MRN: [id number]  HPI   83 year old with hx of recurrent pneumonia, chronic cough - referred by Dr.Sherill Has OSA -compliant with CPAP 8 cm CHronic cough x 11 years.   - Low grade B cell diagnosed 2004. S/p chemo 2011 at [place]. 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 Treated with multiple systemic therapies, Last 12/2014   She has a 25 pack-year smoking history -Quit in 1981 Was using only nocutrnal oxygen--this was discontinued in sept 2013 when she moved from [place] to [place]. She was seeing [facility] in [place].  Underwent sinus surgery 12/04/2015 [name])   12/08/2016  Chief Complaint  Patient presents with  . Follow-up    4 month follow. Pt states she has had the flu and PNA since last visit.      She was hospitalized for influenza about a week after her last visit in January 2018. She also had bibasal pneumonia on chest x-ray, which appears resolved on follow-up chest x-ray and 11/19/16. I have reviewed both these images. Sputum culture was negative Her cough is much improved. She feels that sinus surgery really helped her Dyspnea is at baseline-no wheezing Breathing was much worse when she stopped taking the albuterol She uses albuterol nebs once  daily -  CPAP download 08/2016 with a new machine shows excellent compliance  She is not happy with her DME-[employer] she uses a nasal mask and her head strap was too tight and they were unable to replace .    spirometry again does not show any airway obstruction, mild restriction   Significant tests/ events reviewed   04/2013 head CT - Sinus disease with mucosal thickening involving all sinus cavities. echo in 2014 with nml EF , GR 1 DD   08/2015 CT sinus >>Extensive multifocal paranasal sinus disease, most marked in the ethmoid air cell complexes and in the left  maxillary antrum  05/2015 CT chest showed bilateral lower lobe consolidation  10/2015 ONO shows minimal desaturations; does not need O2.  01/2016 pansensitive pseudomonas in sputum  PFTs 12/2012 No airway obstruction ratio 74, FEV1 84%, DLCO 56%   Review of Systems neg for any significant sore throat, dysphagia, itching, sneezing, nasal congestion or excess/ purulent secretions, fever, chills, sweats, unintended wt loss, pleuritic or exertional cp, hempoptysis, orthopnea pnd or change in chronic leg swelling. Also denies presyncope, palpitations, heartburn, abdominal pain, nausea, vomiting, diarrhea or change in bowel or urinary habits, dysuria,hematuria, rash, arthralgias, visual complaints, headache, numbness weakness or ataxia.     Objective:   Physical Exam  Gen. Pleasant, obese, in no distress ENT - no lesions, no post nasal drip Neck: No JVD, no thyromegaly, no carotid bruits Lungs: no use of accessory muscles, no dullness to percussion, decreased without rales or rhonchi  Cardiovascular: Rhythm regular, heart sounds  normal, no murmurs or gallops, no peripheral edema Musculoskeletal: No deformities, no cyanosis or clubbing , no tremors       Assessment & Plan:

## 2016-12-08 NOTE — Addendum Note (Signed)
Addended by: Valerie Salts on: 12/08/2016 10:31 AM   Modules accepted: Orders

## 2016-12-08 NOTE — Assessment & Plan Note (Signed)
Sputum culture was -08/2016 . Has she cleared her pseudomonas?

## 2016-12-08 NOTE — Assessment & Plan Note (Signed)
Continue albuterol nebs once daily  Call us if breathing gets worse

## 2016-12-10 ENCOUNTER — Telehealth: Payer: Self-pay | Admitting: Pulmonary Disease

## 2016-12-10 NOTE — Telephone Encounter (Signed)
lmtcb x1 for Natasha Ellis with [facility]. 

## 2016-12-11 NOTE — Telephone Encounter (Signed)
Pt needs to be contacted with this information-she will have to stay with [employer] until her contract is up. I attempted to contact patient several times but keep getting message of call can not be completed at this time and to try again later. Will forward to Triage to have them attempt to contact patient again today.

## 2016-12-11 NOTE — Telephone Encounter (Signed)
Melissa returned phone call..ert

## 2016-12-11 NOTE — Telephone Encounter (Signed)
Attempted to call the pt to make her aware of Natasha Ellis's recs but the line keeps telling me to try my call again.

## 2016-12-14 NOTE — Telephone Encounter (Signed)
ATC pt-received recording stating my call did not go through to call again. Wcb.

## 2016-12-15 NOTE — Telephone Encounter (Signed)
ATC, NA and no option to leave a msg

## 2016-12-16 NOTE — Telephone Encounter (Signed)
Atc, na and will close per triage protocol

## 2016-12-22 ENCOUNTER — Telehealth: Payer: Self-pay | Admitting: Oncology

## 2016-12-22 ENCOUNTER — Other Ambulatory Visit (HOSPITAL_BASED_OUTPATIENT_CLINIC_OR_DEPARTMENT_OTHER): Payer: Medicare Other

## 2016-12-22 ENCOUNTER — Ambulatory Visit (HOSPITAL_BASED_OUTPATIENT_CLINIC_OR_DEPARTMENT_OTHER): Payer: Medicare Other | Admitting: Nurse Practitioner

## 2016-12-22 VITALS — BP 151/62 | HR 65 | Temp 97.2°F | Resp 20 | Ht 66.0 in | Wt 275.2 lb

## 2016-12-22 DIAGNOSIS — C880 Waldenstrom macroglobulinemia: Secondary | ICD-10-CM

## 2016-12-22 DIAGNOSIS — I638 Other cerebral infarction: Secondary | ICD-10-CM | POA: Diagnosis not present

## 2016-12-22 LAB — CBC WITH DIFFERENTIAL/PLATELET
BASO%: 0.7 % (ref 0.0–2.0)
Basophils Absolute: 0 10*3/uL (ref 0.0–0.1)
EOS%: 1.7 % (ref 0.0–7.0)
Eosinophils Absolute: 0.1 10*3/uL (ref 0.0–0.5)
HCT: 34.5 % — ABNORMAL LOW (ref 34.8–46.6)
HGB: 11.5 g/dL — ABNORMAL LOW (ref 11.6–15.9)
LYMPH%: 24.8 % (ref 14.0–49.7)
MCH: 27.7 pg (ref 25.1–34.0)
MCHC: 33.2 g/dL (ref 31.5–36.0)
MCV: 83.5 fL (ref 79.5–101.0)
MONO#: 0.2 10*3/uL (ref 0.1–0.9)
MONO%: 6.5 % (ref 0.0–14.0)
NEUT#: 2 10*3/uL (ref 1.5–6.5)
NEUT%: 66.3 % (ref 38.4–76.8)
Platelets: 257 10*3/uL (ref 145–400)
RBC: 4.14 10*6/uL (ref 3.70–5.45)
RDW: 16.6 % — ABNORMAL HIGH (ref 11.2–14.5)
WBC: 3.1 10*3/uL — ABNORMAL LOW (ref 3.9–10.3)
lymph#: 0.8 10*3/uL — ABNORMAL LOW (ref 0.9–3.3)

## 2016-12-22 LAB — COMPREHENSIVE METABOLIC PANEL
ALT: 15 U/L (ref 0–55)
AST: 16 U/L (ref 5–34)
Albumin: 3.8 g/dL (ref 3.5–5.0)
Alkaline Phosphatase: 67 U/L (ref 40–150)
Anion Gap: 9 mEq/L (ref 3–11)
BUN: 20.4 mg/dL (ref 7.0–26.0)
CO2: 27 mEq/L (ref 22–29)
Calcium: 9.8 mg/dL (ref 8.4–10.4)
Chloride: 110 mEq/L — ABNORMAL HIGH (ref 98–109)
Creatinine: 1.1 mg/dL (ref 0.6–1.1)
EGFR: 45 mL/min/{1.73_m2} — ABNORMAL LOW (ref >90)
Glucose: 103 mg/dl (ref 70–140)
Potassium: 4.1 mEq/L (ref 3.5–5.1)
Sodium: 146 mEq/L — ABNORMAL HIGH (ref 136–145)
Total Bilirubin: 0.61 mg/dL (ref 0.20–1.20)
Total Protein: 6.9 g/dL (ref 6.4–8.3)

## 2016-12-22 NOTE — Telephone Encounter (Signed)
Gave patient AVS and calender per 5/22 los - lab and f/u in 3 months with GBS

## 2016-12-22 NOTE — Progress Notes (Addendum)
  [facility] OFFICE PROGRESS NOTE   Diagnosis:  Natasha Ellis  INTERVAL HISTORY:   Natasha Ellis returns as scheduled. Energy level remains poor. She has stable dyspnea on exertion. No fevers or sweats. No enlarged lymph nodes.  Objective:  Vital signs in last 24 hours:  Blood pressure (!) 151/62, pulse 65, temperature 97.2 F (36.2 C), temperature source Oral, resp. rate 20, height 5\' 6"  (1.676 m), weight 275 lb 3.2 oz (124.8 kg), SpO2 98 %.    HEENT: No thrush or ulcers. Lymphatics: No palpable cervical, supraclavicular, axillary or inguinal lymph nodes. Resp: Distant breath sounds. No respiratory distress. Occasional wheeze. Cardio: Regular rate and rhythm. GI: Abdomen soft and nontender. No organomegaly. Vascular: Trace edema at the lower leg/ankles bilaterally.  Lab Results:  Lab Results  Component Value Date   WBC 3.1 (L) 12/22/2016   HGB 11.5 (L) 12/22/2016   HCT 34.5 (L) 12/22/2016   MCV 83.5 12/22/2016   PLT 257 12/22/2016   NEUTROABS 2.0 12/22/2016    Imaging:  No results found.  Medications: I have reviewed the patient's current medications.  Assessment/Plan: 1. 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 Treated with multiple systemic therapies includingpentostatin/Cytoxan/rituximab in 2010.   Cycle 1 bendamustine/Rituxan 05/31/2014.  Cycle 2 bendamustine/Rituxan 07/02/2014  Cycle 3 bendamustine/rituximab 07/30/2014  Cycle 4 bendamustine/rituximab 09/10/2014  Cycle 5 bendamustine/rituximab 10/22/2014  Cycle 6 bendamustine/rituximab 12/10/2014 2. Left knee arthritis. Followed by Dr. Wynelle Link. 3. Sleep apnea. 4. Anemia.  microcytic with peripheral blood smear findings consistent with iron deficiency 07/09/2015, serum iron studies consistent  with "anemia of chronic disease ", stool Hemoccult cards negative 07/11/2015 5. Question left parotitis 12/12/2013 presenting with left facial and parotid edema. 6. Admission with pneumonia February 2016 7. History of Neutropenia secondary to chemotherapy, now with moderate neutropenia 8. Left lung pneumonia 04/02/2015, sputum culture positive for Moraxella 06/19/2015 9. Low IgG and IgA 10. CT chest 05/13/2015 with bilateral lower lung consolidation 11. CT chest, abdomen, and pelvis 01/08/2016-new peribronchial thickening and peripheral tree in bud pattern at the right lower lobe, no lymphadenopathy 12. Sinus mucosal thickening/opacification on CT 08/16/2015-status post sinus surgery 12/05/2015 13. Sputum culture positive for pseudomonas aeruginosa on 01/10/2016-treated with 10 days of ciprofloxacin 14. 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 Admission with pneumonia 08/26/2016   Disposition: Natasha Ellis appears unchanged. The hemoglobin is better as compared to 3 months ago. We will follow-up on the [name] level from today.  She asked today if she would be eligible to receive the "new shingles vaccine". We will discuss with the [facility] pharmacist.  She will return for a follow-up visit and labs in 3 months. She will contact the office in the interim with any problems.  Patient seen with Dr. Benay Spice.    Ned Card ANP/GNP-BC   12/22/2016  10:38 AM  This was a shared visit with Ned Card. There is no clinical evidence for progression of the Waldenstrm's. We will follow-up on the [name] level from today. She will return for an office visit in 3 months. Julieanne Manson, M.D.

## 2016-12-23 ENCOUNTER — Telehealth: Payer: Self-pay | Admitting: *Deleted

## 2016-12-23 LAB — IGM: IgM, Qn, Serum: 1456 mg/dL — ABNORMAL HIGH (ref 26–217)

## 2016-12-23 NOTE — Telephone Encounter (Signed)
-----   Message from Ladell Pier, MD sent at 12/23/2016  1:55 PM EDT ----- Please call patient, [name] is stable

## 2016-12-23 NOTE — Telephone Encounter (Signed)
Patient notified per order of Dr. Benay Spice that [name] is stable.  Patient appreciative of call and has no questions at this time.

## 2016-12-24 LAB — PROTEIN ELECTROPHORESIS, SERUM
A/G Ratio: 1.2 (ref 0.7–1.7)
Albumin: 3.5 g/dL (ref 2.9–4.4)
Alpha 1: 0.2 g/dL (ref 0.0–0.4)
Alpha 2: 0.7 g/dL (ref 0.4–1.0)
Beta: 0.8 g/dL (ref 0.7–1.3)
Gamma Globulin: 1.3 g/dL (ref 0.4–1.8)
Globulin, Total: 3 g/dL (ref 2.2–3.9)
M-Spike, %: 1 g/dL — ABNORMAL HIGH
Total Protein: 6.5 g/dL (ref 6.0–8.5)

## 2017-01-14 DIAGNOSIS — M1712 Unilateral primary osteoarthritis, left knee: Secondary | ICD-10-CM | POA: Diagnosis not present

## 2017-01-22 DIAGNOSIS — M1712 Unilateral primary osteoarthritis, left knee: Secondary | ICD-10-CM | POA: Diagnosis not present

## 2017-01-27 DIAGNOSIS — Z961 Presence of intraocular lens: Secondary | ICD-10-CM | POA: Diagnosis not present

## 2017-01-28 DIAGNOSIS — M1712 Unilateral primary osteoarthritis, left knee: Secondary | ICD-10-CM | POA: Diagnosis not present

## 2017-02-08 DIAGNOSIS — H906 Mixed conductive and sensorineural hearing loss, bilateral: Secondary | ICD-10-CM | POA: Diagnosis not present

## 2017-02-24 DIAGNOSIS — H0012 Chalazion right lower eyelid: Secondary | ICD-10-CM | POA: Diagnosis not present

## 2017-03-11 ENCOUNTER — Telehealth: Payer: Self-pay | Admitting: Neurology

## 2017-03-11 NOTE — Telephone Encounter (Signed)
Dr Posey Pronto, [place] advise

## 2017-03-11 NOTE — Telephone Encounter (Signed)
PT called and said her legs are really weak and she is shaking, please call her back and advise

## 2017-03-11 NOTE — Telephone Encounter (Signed)
Spoke w/Pt, she was on vacation last wk, had many stairs to climb all wk. Legs were sore, but improved, now are hurting and some weakness. I advsd her to use some moist heat and use OTC advil or tylenol if she's able to use those and I would check w/Dr Posey Pronto for any recommendations and call her back tomorrow. Pt advsd will not be home, pls call her cell [phone]

## 2017-03-12 NOTE — Telephone Encounter (Signed)
Her pain is most likely stemming from her back.  If it is not improved with tylenol or NSAIDs, we can start her tizanidine 2mg  at bedtime as needed.    Deedra Pro K. Posey Pronto, DO

## 2017-03-12 NOTE — Telephone Encounter (Signed)
Left message for patient to call me back. 

## 2017-03-12 NOTE — Telephone Encounter (Signed)
Patient said that ibuprofen has helped her.  She is worried that she may have had another stroke.  She has a follow up appointment on October 3 but I told her we would put her on the waiting list.

## 2017-03-22 DIAGNOSIS — H10413 Chronic giant papillary conjunctivitis, bilateral: Secondary | ICD-10-CM | POA: Diagnosis not present

## 2017-03-25 ENCOUNTER — Telehealth: Payer: Self-pay | Admitting: Oncology

## 2017-03-25 ENCOUNTER — Ambulatory Visit (HOSPITAL_BASED_OUTPATIENT_CLINIC_OR_DEPARTMENT_OTHER): Payer: Medicare Other | Admitting: Oncology

## 2017-03-25 ENCOUNTER — Other Ambulatory Visit (HOSPITAL_BASED_OUTPATIENT_CLINIC_OR_DEPARTMENT_OTHER): Payer: Medicare Other

## 2017-03-25 VITALS — BP 142/72 | HR 70 | Temp 98.1°F | Resp 20 | Ht 66.0 in | Wt 280.1 lb

## 2017-03-25 DIAGNOSIS — C880 Waldenstrom macroglobulinemia: Secondary | ICD-10-CM | POA: Diagnosis not present

## 2017-03-25 DIAGNOSIS — Z85828 Personal history of other malignant neoplasm of skin: Secondary | ICD-10-CM | POA: Diagnosis not present

## 2017-03-25 DIAGNOSIS — L905 Scar conditions and fibrosis of skin: Secondary | ICD-10-CM | POA: Diagnosis not present

## 2017-03-25 DIAGNOSIS — I638 Other cerebral infarction: Secondary | ICD-10-CM

## 2017-03-25 DIAGNOSIS — L309 Dermatitis, unspecified: Secondary | ICD-10-CM | POA: Diagnosis not present

## 2017-03-25 LAB — COMPREHENSIVE METABOLIC PANEL
ALT: 12 U/L (ref 0–55)
AST: 13 U/L (ref 5–34)
Albumin: 3.6 g/dL (ref 3.5–5.0)
Alkaline Phosphatase: 85 U/L (ref 40–150)
Anion Gap: 9 mEq/L (ref 3–11)
BUN: 17.7 mg/dL (ref 7.0–26.0)
CO2: 26 mEq/L (ref 22–29)
Calcium: 10 mg/dL (ref 8.4–10.4)
Chloride: 104 mEq/L (ref 98–109)
Creatinine: 1.1 mg/dL (ref 0.6–1.1)
EGFR: 45 mL/min/{1.73_m2} — ABNORMAL LOW (ref >90)
Glucose: 109 mg/dl (ref 70–140)
Potassium: 4.3 mEq/L (ref 3.5–5.1)
Sodium: 140 mEq/L (ref 136–145)
Total Bilirubin: 0.93 mg/dL (ref 0.20–1.20)
Total Protein: 7.6 g/dL (ref 6.4–8.3)

## 2017-03-25 LAB — CBC WITH DIFFERENTIAL/PLATELET
BASO%: 0.3 % (ref 0.0–2.0)
Basophils Absolute: 0 10*3/uL (ref 0.0–0.1)
EOS%: 1.4 % (ref 0.0–7.0)
Eosinophils Absolute: 0.1 10*3/uL (ref 0.0–0.5)
HCT: 33.6 % — ABNORMAL LOW (ref 34.8–46.6)
HGB: 11.2 g/dL — ABNORMAL LOW (ref 11.6–15.9)
LYMPH%: 13.2 % — ABNORMAL LOW (ref 14.0–49.7)
MCH: 29.6 pg (ref 25.1–34.0)
MCHC: 33.3 g/dL (ref 31.5–36.0)
MCV: 88.7 fL (ref 79.5–101.0)
MONO#: 0.4 10*3/uL (ref 0.1–0.9)
MONO%: 5.6 % (ref 0.0–14.0)
NEUT#: 6.2 10*3/uL (ref 1.5–6.5)
NEUT%: 79.5 % — ABNORMAL HIGH (ref 38.4–76.8)
Platelets: 236 10*3/uL (ref 145–400)
RBC: 3.79 10*6/uL (ref 3.70–5.45)
RDW: 15 % — ABNORMAL HIGH (ref 11.2–14.5)
WBC: 7.9 10*3/uL (ref 3.9–10.3)
lymph#: 1 10*3/uL (ref 0.9–3.3)
nRBC: 0 % (ref 0–0)

## 2017-03-25 NOTE — Telephone Encounter (Signed)
Gave AVS report and calendar of upcoming January appointments.

## 2017-03-25 NOTE — Progress Notes (Signed)
error 

## 2017-03-25 NOTE — Progress Notes (Signed)
[place] OFFICE PROGRESS NOTE   Diagnosis: Waldenstrm's macroglobulinemia  INTERVAL HISTORY:   [name] returns as scheduled. She reports improvement in dyspnea. She has multiple mosquito bites over the arms. She reports being allergic to mosquitoes. She has a chronic nonhealing wound at the left lower leg. She is scheduled to see dermatology later today.  On the morning of 03/21/2017 she woke up with pain in the low back. This has persisted. No radicular pain. No neurologic symptoms.  Objective:  Vital signs in last 24 hours:  Blood pressure (!) 142/72, pulse 70, temperature 98.1 F (36.7 C), temperature source Oral, resp. rate 20, height 5\' 6"  (1.676 m), weight 280 lb 1.6 oz (127.1 kg), SpO2 96 %.    HEENT: Neck without mass Lymphatics: No cervical, supraclavicular, or axillary nodes Resp: Lungs clear bilaterally Cardio: Regular rate and rhythm GI: No hepatosplenomegaly, nontender Vascular: No leg edema  Skin: Multiple erythematous bite type lesions over the arms, scabbed lesion at the left lower leg    Lab Results:  Lab Results  Component Value Date   WBC 7.9 03/25/2017   HGB 11.2 (L) 03/25/2017   HCT 33.6 (L) 03/25/2017   MCV 88.7 03/25/2017   PLT 236 03/25/2017   NEUTROABS 6.2 03/25/2017    CMP     Component Value Date/Time   NA 140 03/25/2017 0944   K 4.3 03/25/2017 0944   CL 106 08/28/2016 0534   CL 107 10/24/2012 1056   CO2 26 03/25/2017 0944   GLUCOSE 109 03/25/2017 0944   GLUCOSE 125 (H) 10/24/2012 1056   BUN 17.7 03/25/2017 0944   CREATININE 1.1 03/25/2017 0944   CALCIUM 10.0 03/25/2017 0944   PROT 7.6 03/25/2017 0944   ALBUMIN 3.6 03/25/2017 0944   AST 13 03/25/2017 0944   ALT 12 03/25/2017 0944   ALKPHOS 85 03/25/2017 0944   BILITOT 0.93 03/25/2017 0944   GFRNONAA >60 08/28/2016 0534   GFRAA >60 08/28/2016 0534   [name] on 12/22/2016-1456, serum M spike 1.0  Medications: I have reviewed the patient's current  medications.  Assessment/Plan: 1. 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 Treated with multiple systemic therapies includingpentostatin/Cytoxan/rituximab in 2010.   Cycle 1 bendamustine/Rituxan 05/31/2014.  Cycle 2 bendamustine/Rituxan 07/02/2014  Cycle 3 bendamustine/rituximab 07/30/2014  Cycle 4 bendamustine/rituximab 09/10/2014  Cycle 5 bendamustine/rituximab 10/22/2014  Cycle 6 bendamustine/rituximab 12/10/2014 2. Left knee arthritis. Followed by Dr. Wynelle Link. 3. Sleep apnea. 4. Anemia. microcytic with peripheral blood smear findings consistent with iron deficiency 07/09/2015, serum iron studies consistent with "anemia of chronic disease ", stool Hemoccult cards negative 07/11/2015 5. Question left parotitis 12/12/2013 presenting with left facial and parotid edema. 6. Admission with pneumonia February 2016 7. History of Neutropenia secondary to chemotherapy, now with moderate neutropenia 8. Left lung pneumonia 04/02/2015, sputum culture positive for Moraxella 06/19/2015 9. Low IgG and IgA 10. CT chest 05/13/2015 with bilateral lower lung consolidation 11. CT chest, abdomen, and pelvis 01/08/2016-new peribronchial thickening and peripheral tree in bud pattern at the right lower lobe, no lymphadenopathy 12. Sinus mucosal thickening/opacification on CT 08/16/2015-status post sinus surgery 12/05/2015 13. Sputum culture positive for pseudomonas aeruginosa on 01/10/2016-treated with 10 days of ciprofloxacin 14. 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 Admission with pneumonia 08/26/2016   Disposition:  [name] appears asymptomatic from the [facility] macroglobulinemia.  There is no clinical evidence of disease progression. We will follow-up on the [name] level from today. She  will return for an office and lab visit in 4 months.  I encouraged her to obtain an influenza vaccine in the next 1-2 months.  She will follow-up with dermatology for management of the skin lesions.  15 minutes were spent with the patient today. The majority of the time was used for counseling and coordination of care.  Donneta Romberg, MD  03/25/2017  10:53 AM

## 2017-03-26 DIAGNOSIS — H9212 Otorrhea, left ear: Secondary | ICD-10-CM | POA: Diagnosis not present

## 2017-03-26 DIAGNOSIS — R030 Elevated blood-pressure reading, without diagnosis of hypertension: Secondary | ICD-10-CM | POA: Diagnosis not present

## 2017-03-26 DIAGNOSIS — Z6841 Body Mass Index (BMI) 40.0 and over, adult: Secondary | ICD-10-CM | POA: Diagnosis not present

## 2017-03-26 DIAGNOSIS — J324 Chronic pansinusitis: Secondary | ICD-10-CM | POA: Diagnosis not present

## 2017-03-26 DIAGNOSIS — M545 Low back pain: Secondary | ICD-10-CM | POA: Diagnosis not present

## 2017-03-26 DIAGNOSIS — H72813 Multiple perforations of tympanic membrane, bilateral: Secondary | ICD-10-CM | POA: Diagnosis not present

## 2017-03-26 LAB — IGM: IgM, Qn, Serum: 1525 mg/dL — ABNORMAL HIGH (ref 26–217)

## 2017-03-28 LAB — PROTEIN ELECTROPHORESIS, SERUM
A/G Ratio: 1.1 (ref 0.7–1.7)
Albumin: 3.6 g/dL (ref 2.9–4.4)
Alpha 1: 0.3 g/dL (ref 0.0–0.4)
Alpha 2: 0.8 g/dL (ref 0.4–1.0)
Beta: 0.8 g/dL (ref 0.7–1.3)
Gamma Globulin: 1.4 g/dL (ref 0.4–1.8)
Globulin, Total: 3.3 g/dL (ref 2.2–3.9)
M-Spike, %: 1.2 g/dL — ABNORMAL HIGH
Total Protein: 6.9 g/dL (ref 6.0–8.5)

## 2017-03-30 DIAGNOSIS — M62830 Muscle spasm of back: Secondary | ICD-10-CM | POA: Diagnosis not present

## 2017-03-30 DIAGNOSIS — R262 Difficulty in walking, not elsewhere classified: Secondary | ICD-10-CM | POA: Diagnosis not present

## 2017-03-30 DIAGNOSIS — M545 Low back pain: Secondary | ICD-10-CM | POA: Diagnosis not present

## 2017-03-30 DIAGNOSIS — M170 Bilateral primary osteoarthritis of knee: Secondary | ICD-10-CM | POA: Diagnosis not present

## 2017-03-31 DIAGNOSIS — M170 Bilateral primary osteoarthritis of knee: Secondary | ICD-10-CM | POA: Diagnosis not present

## 2017-03-31 DIAGNOSIS — M62830 Muscle spasm of back: Secondary | ICD-10-CM | POA: Diagnosis not present

## 2017-03-31 DIAGNOSIS — R262 Difficulty in walking, not elsewhere classified: Secondary | ICD-10-CM | POA: Diagnosis not present

## 2017-03-31 DIAGNOSIS — M545 Low back pain: Secondary | ICD-10-CM | POA: Diagnosis not present

## 2017-04-01 ENCOUNTER — Telehealth: Payer: Self-pay

## 2017-04-01 NOTE — Telephone Encounter (Addendum)
Pt called for results of labs drawn on 8/23 including SPEP and [name]. She called from [phone], which is not on her profile.

## 2017-04-06 DIAGNOSIS — R262 Difficulty in walking, not elsewhere classified: Secondary | ICD-10-CM | POA: Diagnosis not present

## 2017-04-06 DIAGNOSIS — M545 Low back pain: Secondary | ICD-10-CM | POA: Diagnosis not present

## 2017-04-06 DIAGNOSIS — M170 Bilateral primary osteoarthritis of knee: Secondary | ICD-10-CM | POA: Diagnosis not present

## 2017-04-06 DIAGNOSIS — M62830 Muscle spasm of back: Secondary | ICD-10-CM | POA: Diagnosis not present

## 2017-04-12 ENCOUNTER — Ambulatory Visit: Payer: Medicare Other | Admitting: Adult Health

## 2017-04-16 ENCOUNTER — Other Ambulatory Visit: Payer: Self-pay | Admitting: Neurology

## 2017-04-16 DIAGNOSIS — I6523 Occlusion and stenosis of bilateral carotid arteries: Secondary | ICD-10-CM

## 2017-04-20 DIAGNOSIS — H10413 Chronic giant papillary conjunctivitis, bilateral: Secondary | ICD-10-CM | POA: Diagnosis not present

## 2017-04-30 ENCOUNTER — Ambulatory Visit (HOSPITAL_COMMUNITY)
Admission: RE | Admit: 2017-04-30 | Discharge: 2017-04-30 | Disposition: A | Discharge status: Home / Self Care | Payer: Medicare Other | Source: Ambulatory Visit | Attending: Cardiovascular Disease | Admitting: Cardiovascular Disease

## 2017-04-30 DIAGNOSIS — Z8673 Personal history of transient ischemic attack (TIA), and cerebral infarction without residual deficits: Secondary | ICD-10-CM | POA: Diagnosis not present

## 2017-04-30 DIAGNOSIS — J449 Chronic obstructive pulmonary disease, unspecified: Secondary | ICD-10-CM | POA: Insufficient documentation

## 2017-04-30 DIAGNOSIS — Z87898 Personal history of other specified conditions: Secondary | ICD-10-CM | POA: Diagnosis not present

## 2017-04-30 DIAGNOSIS — I10 Essential (primary) hypertension: Secondary | ICD-10-CM | POA: Insufficient documentation

## 2017-04-30 DIAGNOSIS — I6523 Occlusion and stenosis of bilateral carotid arteries: Secondary | ICD-10-CM | POA: Diagnosis not present

## 2017-04-30 DIAGNOSIS — E785 Hyperlipidemia, unspecified: Secondary | ICD-10-CM | POA: Insufficient documentation

## 2017-05-03 ENCOUNTER — Ambulatory Visit (INDEPENDENT_AMBULATORY_CARE_PROVIDER_SITE_OTHER): Payer: Medicare Other | Admitting: Neurology

## 2017-05-03 ENCOUNTER — Encounter: Payer: Self-pay | Admitting: Neurology

## 2017-05-03 VITALS — BP 150/70 | HR 87 | Ht 66.0 in | Wt 272.4 lb

## 2017-05-03 DIAGNOSIS — R251 Tremor, unspecified: Secondary | ICD-10-CM | POA: Diagnosis not present

## 2017-05-03 DIAGNOSIS — I6389 Other cerebral infarction: Secondary | ICD-10-CM

## 2017-05-03 DIAGNOSIS — R29898 Other symptoms and signs involving the musculoskeletal system: Secondary | ICD-10-CM | POA: Diagnosis not present

## 2017-05-03 DIAGNOSIS — I6523 Occlusion and stenosis of bilateral carotid arteries: Secondary | ICD-10-CM | POA: Diagnosis not present

## 2017-05-03 DIAGNOSIS — I6522 Occlusion and stenosis of left carotid artery: Secondary | ICD-10-CM

## 2017-05-03 MED ORDER — ATORVASTATIN CALCIUM 20 MG PO TABS: 20.0000 mg | ORAL_TABLET | Freq: Every day | ORAL | 3 refills | Status: DC

## 2017-05-03 MED ORDER — CLOPIDOGREL BISULFATE 75 MG PO TABS: 75.0000 mg | ORAL_TABLET | Freq: Every day | ORAL | 3 refills | Status: DC

## 2017-05-03 NOTE — Patient Instructions (Addendum)
1.  Check fasting lipid panel 2.  Follow-up with your eye doctor about your blurry vision and please have them send me a report.   3.  If you develop any new left vision loss, or right sided weakness/numbness/tingling, please call my office  4.  Start atorvastatin 20mg  daily 5.  Continue plavix 75mg  daily  Return to clinic in 3 months   Stroke is a medical emergency.  Know these warning signs of stroke. Every second counts:  Sudden numbness or weakness of the face, arm or leg, especially on one side of the body,  Sudden confusion, trouble speaking or understanding,  Sudden trouble seeing in one eye or both eyes,  Sudden trouble walking, dizziness, loss of balance or coordination,  Sudden severe headache with no known cause.  If you or someone with you has one or more of these signs, don't delay!  Immediately call [id number], or the emergency medical services (EMS) number so an ambulance (ideally with advanced life support) can be sent for you.  Also, check the time so that you will know when the symptoms first appeared. It's very important to take immediate action. Medical treatment may be available if action is taken early
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
## 2017-05-04 ENCOUNTER — Telehealth: Payer: Self-pay | Admitting: Neurology

## 2017-05-04 ENCOUNTER — Other Ambulatory Visit: Payer: Medicare Other

## 2017-05-04 DIAGNOSIS — H04123 Dry eye syndrome of bilateral lacrimal glands: Secondary | ICD-10-CM | POA: Diagnosis not present

## 2017-05-04 NOTE — Telephone Encounter (Signed)
Patient called to let Dr. Posey Pronto know that there is nothing Neurological wrong with her Eye. Thanks

## 2017-05-04 NOTE — Telephone Encounter (Signed)
Noted  

## 2017-05-04 NOTE — Telephone Encounter (Signed)
FYI

## 2017-05-05 ENCOUNTER — Other Ambulatory Visit (INDEPENDENT_AMBULATORY_CARE_PROVIDER_SITE_OTHER): Payer: Medicare Other

## 2017-05-05 ENCOUNTER — Ambulatory Visit: Payer: Medicare Other | Admitting: Neurology

## 2017-05-05 DIAGNOSIS — I6389 Other cerebral infarction: Secondary | ICD-10-CM

## 2017-05-05 LAB — LIPID PANEL
Cholesterol: 123 mg/dL (ref 0–200)
HDL: 38.8 mg/dL — ABNORMAL LOW (ref >39.00)
LDL Cholesterol: 68 mg/dL (ref 0–99)
NonHDL: 84.59
Total CHOL/HDL Ratio: 3
Triglycerides: 83 mg/dL (ref 0.0–149.0)
VLDL: 16.6 mg/dL (ref 0.0–40.0)

## 2017-05-06 ENCOUNTER — Ambulatory Visit (INDEPENDENT_AMBULATORY_CARE_PROVIDER_SITE_OTHER): Payer: Medicare Other | Admitting: Acute Care

## 2017-05-06 ENCOUNTER — Ambulatory Visit (INDEPENDENT_AMBULATORY_CARE_PROVIDER_SITE_OTHER)
Admission: RE | Admit: 2017-05-06 | Discharge: 2017-05-06 | Disposition: A | Discharge status: Home / Self Care | Payer: Medicare Other | Source: Ambulatory Visit | Attending: Acute Care | Admitting: Acute Care

## 2017-05-06 ENCOUNTER — Encounter: Payer: Self-pay | Admitting: Acute Care

## 2017-05-06 ENCOUNTER — Other Ambulatory Visit (INDEPENDENT_AMBULATORY_CARE_PROVIDER_SITE_OTHER): Payer: Medicare Other

## 2017-05-06 VITALS — BP 132/70 | HR 74 | Temp 97.1°F | Ht 66.0 in | Wt 269.4 lb

## 2017-05-06 DIAGNOSIS — R05 Cough: Secondary | ICD-10-CM

## 2017-05-06 DIAGNOSIS — R059 Cough, unspecified: Secondary | ICD-10-CM

## 2017-05-06 DIAGNOSIS — I6523 Occlusion and stenosis of bilateral carotid arteries: Secondary | ICD-10-CM

## 2017-05-06 DIAGNOSIS — J189 Pneumonia, unspecified organism: Secondary | ICD-10-CM

## 2017-05-06 LAB — CBC WITH DIFFERENTIAL/PLATELET
Basophils Absolute: 0 10*3/uL (ref 0.0–0.1)
Basophils Relative: 0.6 % (ref 0.0–3.0)
Eosinophils Absolute: 0.1 10*3/uL (ref 0.0–0.7)
Eosinophils Relative: 2.5 % (ref 0.0–5.0)
HCT: 31.8 % — ABNORMAL LOW (ref 36.0–46.0)
Hemoglobin: 10.5 g/dL — ABNORMAL LOW (ref 12.0–15.0)
Lymphocytes Relative: 12.4 % (ref 12.0–46.0)
Lymphs Abs: 0.7 10*3/uL (ref 0.7–4.0)
MCHC: 32.9 g/dL (ref 30.0–36.0)
MCV: 84.4 fl (ref 78.0–100.0)
Monocytes Absolute: 0.3 10*3/uL (ref 0.1–1.0)
Monocytes Relative: 4.7 % (ref 3.0–12.0)
Neutro Abs: 4.7 10*3/uL (ref 1.4–7.7)
Neutrophils Relative %: 79.8 % — ABNORMAL HIGH (ref 43.0–77.0)
Platelets: 362 10*3/uL (ref 150.0–400.0)
RBC: 3.77 Mil/uL — ABNORMAL LOW (ref 3.87–5.11)
RDW: 15.4 % (ref 11.5–15.5)
WBC: 5.9 10*3/uL (ref 4.0–10.5)

## 2017-05-06 MED ORDER — LEVOFLOXACIN 500 MG PO TABS: 500.0000 mg | ORAL_TABLET | Freq: Every day | ORAL | 0 refills | Status: DC

## 2017-05-06 NOTE — Progress Notes (Signed)
History of Present Illness Natasha Ellis is a 83 y.o. 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 She is followed by Dr. Elsworth Soho.  She is maintained on: Albuterol nebs prn   05/06/2017 Pt. Presents for Acute OV: Pt presents today for acute OV for fever and coughing up yellow mucus x 2-3 months. She states she has had issues with cough which has become progressively worse. She states she had a high fever of 101.3 Monday and Tuesday of this week. She has fatigue and she is weak. She has worsening dyspnea with exertion.She is using her albuterol nebs twice daily and she is rarely using her rescue inhaler.  Test Results:  CXR 05/06/2017>>   Right lower lobe atelectasis or pneumonia. Underlying reactive airway disease and smoking related changes. Followup PA and lateral chest X-ray is recommended in 3-4 weeks following trial of antibiotic therapy to ensure resolution and exclude underlying malignancy.  04/2013 head CT - Sinus disease with mucosal thickening involving all sinus cavities. echo in 2014 with nml EF , GR 1 DD   08/2015 CT sinus >>Extensive multifocal paranasal sinus disease, most marked in the ethmoid air cell complexes and in the left maxillary antrum  05/2015 CT chest showed bilateral lower lobe consolidation  10/2015 ONO shows minimal desaturations; does not need O2.  01/2016 pansensitive pseudomonas in sputum  PFTs 12/2012 No airway obstruction ratio 74, FEV1 84%, DLCO 56%  CBC Latest Ref Rng & Units 05/06/2017 03/25/2017 12/22/2016  WBC 4.0 - 10.5 K/uL 5.9 7.9 3.1(L)  Hemoglobin 12.0 - 15.0 g/dL 10.5(L) 11.2(L) 11.5(L)  Hematocrit 36.0 - 46.0 % 31.8(L) 33.6(L) 34.5(L)  Platelets 150.0 - 400.0 K/uL 362.0 236 257    BMP Latest Ref Rng & Units 03/25/2017 12/22/2016 09/22/2016  Glucose 70 - 140 mg/dl 109 103 89  BUN 7.0 - 26.0 mg/dL 17.7 20.4 13.6    Creatinine 0.6 - 1.1 mg/dL 1.1 1.1 0.9  Sodium 136 - 145 mEq/L 140 146(H) 141  Potassium 3.5 - 5.1 mEq/L 4.3 4.1 4.0  Chloride 101 - 111 mmol/L - - -  CO2 22 - 29 mEq/L 26 27 26   Calcium 8.4 - 10.4 mg/dL 10.0 9.8 9.1    BNP    Component Value Date/Time   BNP 135.4 (H) 09/20/2014 0305    ProBNP    Component Value Date/Time   PROBNP 70.0 03/30/2013 1631    PFT    Component Value Date/Time   FEV1PRE 1.81 12/19/2012 1250   FEV1POST 1.80 12/19/2012 1250   FVCPOST 2.43 12/19/2012 1250   TLC 4.80 12/19/2012 1250   DLCOUNC 15.29 12/19/2012 1250   PREFEV1FVCRT 71 12/19/2012 1250   PSTFEV1FVCRT 74 12/19/2012 1250    Dg Chest 2 View  Result Date: 05/06/2017 CLINICAL DATA:  Cough, fatigue, dyspnea for the past 4 days. History of pneumonia, asthma, former smoker. EXAM: CHEST  2 VIEW COMPARISON:  Chest x-ray of November 19, 2016 FINDINGS: The lungs are well-expanded. There is patchy interstitial and alveolar opacity in the right lower lobe. There is no pleural effusion. The left lung is clear. The heart and pulmonary vascularity are normal. There is calcification in the wall of the thoracic aorta. There is mild multilevel degenerative disc disease of the thoracic spine. IMPRESSION: Right lower lobe atelectasis or pneumonia. Underlying reactive airway disease and smoking related changes. Followup PA and lateral chest X-ray is recommended in 3-4  weeks following trial of antibiotic therapy to ensure resolution and exclude underlying malignancy. Thoracic aortic atherosclerosis. Electronically Signed   By: David  Martinique M.D.   On: 05/06/2017 10:37     Past medical hx Past Medical History:  Diagnosis Date  . Arthritis   . Asthma   . Cancer ([place])    Lymphoma  . Cataract   . Chronic cough   . Diverticulosis of intestine    H/O  . DJD (degenerative joint disease) of lumbar spine   . IBS (irritable bowel syndrome)   . Left knee DJD   . 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 Right knee DJD   . Sleep apnea      Social History  Substance Use Topics  . Smoking status: Former Smoker    Packs/day: 1.00    Years: 25.00    Types: Cigarettes    Quit date: 08/04/1979  . Smokeless tobacco: Never Used  . Alcohol use No    Ms.Hernan reports that she quit smoking about 37 years ago. Her smoking use included Cigarettes. She has a 25.00 pack-year smoking history. She has never used smokeless tobacco. She reports that she does not drink alcohol or use drugs.  Tobacco Cessation: Former smoker, 25 pack year smoker quit 1981.  Past surgical hx, Family hx, Social hx all reviewed.  Current Outpatient Prescriptions on File Prior to Visit  Medication Sig  . acetaminophen (TYLENOL) 500 MG tablet Take 1,000 mg by mouth every 6 (six) hours as needed for pain.   Marland Kitchen albuterol (PROAIR HFA) 108 (90 Base) MCG/ACT inhaler Inhale 2 puffs into the lungs every 6 (six) hours as needed for wheezing or shortness of breath.  Marland Kitchen albuterol (PROVENTIL) (2.5 MG/3ML) 0.083% nebulizer solution Take 3 mLs (2.5 mg total) by nebulization every 4 (four) hours as needed for wheezing or shortness of breath.  Marland Kitchen alendronate (FOSAMAX) 70 MG tablet Take 70 mg by mouth once a week. Sundays  . atorvastatin (LIPITOR) 20 MG tablet Take 1 tablet (20 mg total) by mouth daily.  Marland Kitchen b complex vitamins capsule Take 1 capsule by mouth daily.  . calcium gluconate 500 MG tablet Take 1 tablet by mouth daily.  . cholestyramine light (PREVALITE) 4 g packet Take 1 packet by mouth as needed.  . clopidogrel (PLAVIX) 75 MG tablet Take 1 tablet (75 mg total) by mouth daily.  . ferrous sulfate 325 (65 FE) MG tablet Take 325 mg by mouth daily.   . fluticasone (FLONASE) 50 MCG/ACT nasal spray Place 1 spray into both nostrils daily.  . furosemide (LASIX) 40 MG tablet Take 40 mg by mouth as needed.  . potassium chloride (K-DUR) 10 MEQ tablet Take 10 mEq by mouth daily.  . sodium chloride (OCEAN) 0.65 % SOLN  nasal spray Place 1 spray into both nostrils 3 (three) times daily as needed for congestion.   No current facility-administered medications on file prior to visit.      Allergies  Allergen Reactions  . Codeine Other (See Comments)    Crazy-nightmares  . Amoxicillin Diarrhea    Has patient had a PCN reaction causing immediate rash, facial/tongue/throat swelling, SOB or lightheadedness with hypotension:  No Has patient had a PCN reaction causing severe rash involving mucus membranes or skin necrosis: No Has patient had a PCN reaction that required hospitalization No Has patient had a PCN reaction occurring within the last 10 years: Unknown--rx is diarrhea. If all of the above answers are "NO",  then may proceed with Cephalosporin use.    . Cefdinir Cough  . Doxycycline Diarrhea  . Erythromycin Other (See Comments)    GI upset  . Lisinopril Cough  . Lisinopril     unknown  . Oxybutynin Other (See Comments)    Dry mouth  . Sulfa Antibiotics Other (See Comments)    GI upset  . Sulfacetamide Sodium Other (See Comments)    GI upset    Review Of Systems:  Constitutional:   No  weight loss, night sweats,  +Fevers, chills, +fatigue, or  lassitude.  HEENT:   No headaches,  Difficulty swallowing,  Tooth/dental problems, or  Sore throat,                No sneezing, itching, ear ache, nasal congestion, post nasal drip,   CV:  No chest pain,  Orthopnea, PND, swelling in lower extremities, anasarca, dizziness, palpitations, syncope.   GI  No heartburn, indigestion, abdominal pain, nausea, vomiting, diarrhea, change in bowel habits, loss of appetite, bloody stools.   Resp: + shortness of breath with exertion or at rest.  + excess mucus, + productive cough,  No non-productive cough,  No coughing up of blood.  + change in color of mucus.  No wheezing.  No chest wall deformity  Skin: no rash or lesions.  GU: no dysuria, change in color of urine, no urgency or frequency.  No flank pain, no  hematuria   MS:  No joint pain or swelling.  No decreased range of motion.  No back pain.  Psych:  No change in mood or affect. No depression or anxiety.  No memory loss.   Vital Signs BP 132/70 (BP Location: Left Arm, Cuff Size: Large)   Pulse 74   Temp (!) 97.1 F (36.2 C) (Oral)   Ht 5\' 6"  (1.676 m)   Wt 269 lb 6.4 oz (122.2 kg)   SpO2 96%   BMI 43.48 kg/m    Physical Exam:  General- No distress,  A&Ox3, pleasant ENT: No sinus tenderness, TM clear, pale nasal mucosa, no oral exudate,no post nasal drip, no LAN Cardiac: S1, S2, regular rate and rhythm, no murmur Chest: No wheeze/ ? rales/ diminished per bases; no accessory muscle use, no nasal flaring, no sternal retractions Abd.: Soft Non-tender, obese, BS + Ext: No clubbing cyanosis, edema Neuro:  Deconditioned at baseline  Skin: No rashes, warm and dry Psych: normal mood and behavior   Assessment/Plan  Recurrent pneumonia Pneumonia per CXR 05/06/2017 Plan: We will do a CXR today. We will draw labs today. ( CBC/BMET)We will call you with results We will treat you with Levaquin 500 mg once daily x 7 days Probiotic while on antibiotic Mucinex 1200 mg daily for chest congestion Take with a full glass of water Continue Robitussin and Delsym for cough. Tylenol for fever, drink plenty of fluids Follow up in 2 weeks with Judson Roch NP or Dr. Elsworth Soho. Please contact office for sooner follow up if symptoms do not improve or worsen or seek emergency care      Magdalen Spatz, NP 05/06/2017  11:08 AM

## 2017-05-06 NOTE — Patient Instructions (Addendum)
It is nice to meet you today. We will do a CXR today. We will draw labs today. ( CBC/BMET)We will call you with results We will treat you with Levaquin 500 mg once daily x 7 days Probiotic while on antibiotic Mucinex 1200 mg daily for chest congestion Take with a full glass of water Continue Robitussin and Delsym for cough. Tylenol for fever, drink plenty of fluids Follow up in 2 weeks with Judson Roch NP or Dr. Elsworth Soho. Please contact office for sooner follow up if symptoms do not improve or worsen or seek emergency care

## 2017-05-06 NOTE — Assessment & Plan Note (Signed)
Pneumonia per CXR 05/06/2017 Plan: We will do a CXR today. We will draw labs today. ( CBC/BMET)We will call you with results We will treat you with Levaquin 500 mg once daily x 7 days Probiotic while on antibiotic Mucinex 1200 mg daily for chest congestion Take with a full glass of water Continue Robitussin and Delsym for cough. Tylenol for fever, drink plenty of fluids Follow up in 2 weeks with Judson Roch NP or Dr. Elsworth Soho. Please contact office for sooner follow up if symptoms do not improve or worsen or seek emergency care

## 2017-05-07 NOTE — Progress Notes (Signed)
Reviewed & agree with plan  

## 2017-05-10 ENCOUNTER — Telehealth: Payer: Self-pay | Admitting: *Deleted

## 2017-05-10 NOTE — Telephone Encounter (Signed)
-----   Message from Alda Berthold, DO sent at 05/10/2017  8:16 AM EDT ----- Please inform patient that her cholesterol panel look okay.  Continue atorvastatin 20mg  daily.  Thanks.

## 2017-05-10 NOTE — Telephone Encounter (Signed)
Patient given results and instructions.   

## 2017-05-11 ENCOUNTER — Ambulatory Visit: Payer: Medicare Other | Admitting: Adult Health

## 2017-05-13 DIAGNOSIS — M170 Bilateral primary osteoarthritis of knee: Secondary | ICD-10-CM | POA: Diagnosis not present

## 2017-05-13 DIAGNOSIS — R262 Difficulty in walking, not elsewhere classified: Secondary | ICD-10-CM | POA: Diagnosis not present

## 2017-05-17 ENCOUNTER — Telehealth: Payer: Self-pay | Admitting: Neurology

## 2017-05-19 DIAGNOSIS — R262 Difficulty in walking, not elsewhere classified: Secondary | ICD-10-CM | POA: Diagnosis not present

## 2017-05-19 DIAGNOSIS — M170 Bilateral primary osteoarthritis of knee: Secondary | ICD-10-CM | POA: Diagnosis not present

## 2017-05-24 ENCOUNTER — Ambulatory Visit (INDEPENDENT_AMBULATORY_CARE_PROVIDER_SITE_OTHER)
Admission: RE | Admit: 2017-05-24 | Discharge: 2017-05-24 | Disposition: A | Discharge status: Home / Self Care | Payer: Medicare Other | Source: Ambulatory Visit | Attending: Pulmonary Disease | Admitting: Pulmonary Disease

## 2017-05-24 ENCOUNTER — Ambulatory Visit (INDEPENDENT_AMBULATORY_CARE_PROVIDER_SITE_OTHER): Payer: Medicare Other | Admitting: Pulmonary Disease

## 2017-05-24 ENCOUNTER — Other Ambulatory Visit: Payer: Medicare Other

## 2017-05-24 ENCOUNTER — Encounter: Payer: Self-pay | Admitting: Pulmonary Disease

## 2017-05-24 VITALS — BP 126/74 | HR 65 | Ht 66.0 in | Wt 273.0 lb

## 2017-05-24 DIAGNOSIS — J189 Pneumonia, unspecified organism: Secondary | ICD-10-CM | POA: Diagnosis not present

## 2017-05-24 DIAGNOSIS — R05 Cough: Secondary | ICD-10-CM | POA: Diagnosis not present

## 2017-05-24 DIAGNOSIS — Z23 Encounter for immunization: Secondary | ICD-10-CM | POA: Diagnosis not present

## 2017-05-24 DIAGNOSIS — G4733 Obstructive sleep apnea (adult) (pediatric): Secondary | ICD-10-CM | POA: Diagnosis not present

## 2017-05-24 DIAGNOSIS — I6523 Occlusion and stenosis of bilateral carotid arteries: Secondary | ICD-10-CM | POA: Diagnosis not present

## 2017-05-24 DIAGNOSIS — R053 Chronic cough: Secondary | ICD-10-CM

## 2017-05-24 NOTE — Progress Notes (Signed)
   Subjective:    Patient ID: Natasha Ellis, female    DOB: 12/11/1930, 83 y.o.   MRN: [id number]  HPI  83year old with hx of recurrent pneumonia, chronic cough & OSA -on CPAP 8 cm CHronic cough since 2005  - Low grade B cell diagnosed 2004. S/p chemo 2011 at [place]. 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 Treated with multiple systemic therapies, Last 12/2014   She has a 25 pack-year smoking history -Quit in 1981 Was using only nocutrnal oxygen--this was discontinued in sept 2013   Underwent sinus surgery 12/04/2015 [name])   Seen in the office 10/4, chest x-ray showed right lower lobe pneumonia, treated with Levaquin for 7 days . Sputum change color to white but is now back to being yellow, she had several bouts of coughing this morning and could not use her nebulizer. She felt weak due to diarrhea and stopped taking her Lasix for the last 5 days. She started taking cholestyramine for diarrhea Weight is up about 8 pounds. Breathing is a little hard but denies pedal edema or orthopnea  CPAP download was reviewed which shows auto settings 8:15 centimeters with average pressure of 12 with excellent control of events and moderate leak   Significant tests/ events reviewed  08/2015 CT sinus >>Extensive multifocal paranasal sinus disease, most marked in the ethmoid air cell complexes and in the left maxillary antrum  05/2015 CT chest showed bilateral lower lobe consolidation  10/2015 ONO shows minimal desaturations; does not need O2.  01/2016 pansensitive pseudomonas in sputum  PFTs 12/2012 No airway obstruction ratio 74, FEV1 84%, DLCO 56%         spirometry 12/2016  does not show any airway obstruction, mild restriction    Past Medical History:  Diagnosis Date  . Arthritis   . Asthma   . Cancer ([place])    Lymphoma  . Cataract   . Chronic cough   . Diverticulosis of intestine    H/O  . DJD  (degenerative joint disease) of lumbar spine   . IBS (irritable bowel syndrome)   . Left knee DJD   . 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 Right knee DJD   . Sleep apnea      Review of Systems neg for any significant sore throat, dysphagia, itching, sneezing, nasal congestion or excess/ purulent secretions, fever, chills, sweats, unintended wt loss, pleuritic or exertional cp, hempoptysis, orthopnea pnd or change in chronic leg swelling. Also denies presyncope, palpitations, heartburn, abdominal pain, nausea, vomiting, diarrhea or change in bowel or urinary habits, dysuria,hematuria, rash, arthralgias, visual complaints, headache, numbness weakness or ataxia.     Objective:   Physical Exam   Gen. Pleasant, well-nourished, in no distress ENT - no thrush, no post nasal drip Neck: No JVD, no thyromegaly, no carotid bruits Lungs: no use of accessory muscles, no dullness to percussion, coarse without rales or rhonchi  Cardiovascular: Rhythm regular, heart sounds  normal, no murmurs or gallops, no peripheral edema Musculoskeletal: No deformities, no cyanosis or clubbing         Assessment & Plan:

## 2017-05-24 NOTE — Patient Instructions (Signed)
Sputum culture. Chest x-ray today. Based on this, we will decide whether you need a flu shot. CPAP supplies will be renewed for a year

## 2017-05-24 NOTE — Assessment & Plan Note (Signed)
CPAP supplies will be renewed for a year  Weight loss encouraged, compliance with goal of at least 4-6 hrs every night is the expectation. Advised against medications with sedative side effects Cautioned against driving when sleepy - understanding that sleepiness will vary on a day to day basis  

## 2017-05-24 NOTE — Assessment & Plan Note (Addendum)
Sputum culture -since she was noted to be colonized with pseudomonas before Chest x-ray today. Based on this, we will decide whether you need more antibiotics and a flu shot.  Addendum-right lower lobe infiltrates appears improved on chest x-ray can proceed with flu shot, hold off on further antibiotics until sputum culture

## 2017-05-24 NOTE — Addendum Note (Signed)
Addended by: Georjean Mode on: 05/24/2017 12:35 PM   Modules accepted: Orders

## 2017-05-25 DIAGNOSIS — M170 Bilateral primary osteoarthritis of knee: Secondary | ICD-10-CM | POA: Diagnosis not present

## 2017-05-25 DIAGNOSIS — R262 Difficulty in walking, not elsewhere classified: Secondary | ICD-10-CM | POA: Diagnosis not present

## 2017-05-26 LAB — RESPIRATORY CULTURE OR RESPIRATORY AND SPUTUM CULTURE
MICRO NUMBER:: 81177841
SPECIMEN QUALITY:: ADEQUATE

## 2017-05-28 DIAGNOSIS — M170 Bilateral primary osteoarthritis of knee: Secondary | ICD-10-CM | POA: Diagnosis not present

## 2017-05-28 DIAGNOSIS — R262 Difficulty in walking, not elsewhere classified: Secondary | ICD-10-CM | POA: Diagnosis not present

## 2017-05-31 DIAGNOSIS — R262 Difficulty in walking, not elsewhere classified: Secondary | ICD-10-CM | POA: Diagnosis not present

## 2017-05-31 DIAGNOSIS — M170 Bilateral primary osteoarthritis of knee: Secondary | ICD-10-CM | POA: Diagnosis not present

## 2017-06-02 DIAGNOSIS — M170 Bilateral primary osteoarthritis of knee: Secondary | ICD-10-CM | POA: Diagnosis not present

## 2017-06-02 DIAGNOSIS — R262 Difficulty in walking, not elsewhere classified: Secondary | ICD-10-CM | POA: Diagnosis not present

## 2017-06-07 ENCOUNTER — Telehealth: Payer: Self-pay | Admitting: Neurology

## 2017-06-07 NOTE — Telephone Encounter (Signed)
Patient called regarding her Lipitor medication. She has been taking 1/2 a pill for 4 days and her Diarrhea has came back. She was taking a whole pill prior. Please Call. Thanks

## 2017-06-08 DIAGNOSIS — R609 Edema, unspecified: Secondary | ICD-10-CM | POA: Diagnosis not present

## 2017-06-08 DIAGNOSIS — N3941 Urge incontinence: Secondary | ICD-10-CM | POA: Diagnosis not present

## 2017-06-08 DIAGNOSIS — M79605 Pain in left leg: Secondary | ICD-10-CM | POA: Diagnosis not present

## 2017-06-08 DIAGNOSIS — Z8673 Personal history of transient ischemic attack (TIA), and cerebral infarction without residual deficits: Secondary | ICD-10-CM | POA: Diagnosis not present

## 2017-06-08 DIAGNOSIS — M8580 Other specified disorders of bone density and structure, unspecified site: Secondary | ICD-10-CM | POA: Diagnosis not present

## 2017-06-08 NOTE — Telephone Encounter (Signed)
Left message informing the patient that we do not think it is the lipitor causing the diarrhea since it stopped for a while.  Informed her that she could stop the lipitor and see if it helps per Dr. Posey Pronto and to call back if no better.

## 2017-06-11 DIAGNOSIS — M1712 Unilateral primary osteoarthritis, left knee: Secondary | ICD-10-CM | POA: Diagnosis not present

## 2017-06-14 DIAGNOSIS — M170 Bilateral primary osteoarthritis of knee: Secondary | ICD-10-CM | POA: Diagnosis not present

## 2017-06-14 DIAGNOSIS — R262 Difficulty in walking, not elsewhere classified: Secondary | ICD-10-CM | POA: Diagnosis not present

## 2017-06-16 DIAGNOSIS — R262 Difficulty in walking, not elsewhere classified: Secondary | ICD-10-CM | POA: Diagnosis not present

## 2017-06-16 DIAGNOSIS — M170 Bilateral primary osteoarthritis of knee: Secondary | ICD-10-CM | POA: Diagnosis not present

## 2017-06-18 DIAGNOSIS — R262 Difficulty in walking, not elsewhere classified: Secondary | ICD-10-CM | POA: Diagnosis not present

## 2017-06-18 DIAGNOSIS — M170 Bilateral primary osteoarthritis of knee: Secondary | ICD-10-CM | POA: Diagnosis not present

## 2017-07-21 DIAGNOSIS — I10 Essential (primary) hypertension: Secondary | ICD-10-CM | POA: Diagnosis not present

## 2017-07-21 DIAGNOSIS — R42 Dizziness and giddiness: Secondary | ICD-10-CM | POA: Diagnosis not present

## 2017-07-21 DIAGNOSIS — R600 Localized edema: Secondary | ICD-10-CM | POA: Diagnosis not present

## 2017-07-26 DIAGNOSIS — I10 Essential (primary) hypertension: Secondary | ICD-10-CM | POA: Diagnosis not present

## 2017-07-26 DIAGNOSIS — R42 Dizziness and giddiness: Secondary | ICD-10-CM | POA: Diagnosis not present

## 2017-07-26 DIAGNOSIS — R600 Localized edema: Secondary | ICD-10-CM | POA: Diagnosis not present

## 2017-07-29 ENCOUNTER — Other Ambulatory Visit: Payer: Self-pay | Admitting: Internal Medicine

## 2017-07-29 ENCOUNTER — Ambulatory Visit
Admission: RE | Admit: 2017-07-29 | Discharge: 2017-07-29 | Disposition: A | Discharge status: Home / Self Care | Payer: Medicare Other | Source: Ambulatory Visit | Attending: Internal Medicine | Admitting: Internal Medicine

## 2017-07-29 DIAGNOSIS — R0600 Dyspnea, unspecified: Secondary | ICD-10-CM

## 2017-07-29 DIAGNOSIS — R0609 Other forms of dyspnea: Principal | ICD-10-CM

## 2017-07-29 DIAGNOSIS — R0602 Shortness of breath: Secondary | ICD-10-CM | POA: Diagnosis not present

## 2017-08-06 ENCOUNTER — Other Ambulatory Visit (HOSPITAL_BASED_OUTPATIENT_CLINIC_OR_DEPARTMENT_OTHER): Payer: Medicare Other

## 2017-08-06 ENCOUNTER — Telehealth: Payer: Self-pay | Admitting: Oncology

## 2017-08-06 ENCOUNTER — Ambulatory Visit (HOSPITAL_BASED_OUTPATIENT_CLINIC_OR_DEPARTMENT_OTHER): Payer: Medicare Other | Admitting: Oncology

## 2017-08-06 ENCOUNTER — Encounter: Payer: Self-pay | Admitting: Oncology

## 2017-08-06 VITALS — BP 161/68 | HR 73 | Temp 97.4°F | Resp 18 | Ht 66.0 in | Wt 277.4 lb

## 2017-08-06 DIAGNOSIS — C880 Waldenstrom macroglobulinemia: Secondary | ICD-10-CM

## 2017-08-06 DIAGNOSIS — Z8572 Personal history of non-Hodgkin lymphomas: Secondary | ICD-10-CM | POA: Diagnosis not present

## 2017-08-06 LAB — CBC WITH DIFFERENTIAL/PLATELET
BASO%: 0.4 % (ref 0.0–2.0)
Basophils Absolute: 0 10*3/uL (ref 0.0–0.1)
EOS%: 2.4 % (ref 0.0–7.0)
Eosinophils Absolute: 0.1 10*3/uL (ref 0.0–0.5)
HCT: 36.7 % (ref 34.8–46.6)
HGB: 12.2 g/dL (ref 11.6–15.9)
LYMPH%: 24.4 % (ref 14.0–49.7)
MCH: 29.3 pg (ref 25.1–34.0)
MCHC: 33.2 g/dL (ref 31.5–36.0)
MCV: 88.2 fL (ref 79.5–101.0)
MONO#: 0.3 10*3/uL (ref 0.1–0.9)
MONO%: 5 % (ref 0.0–14.0)
NEUT#: 3.6 10*3/uL (ref 1.5–6.5)
NEUT%: 67.8 % (ref 38.4–76.8)
Platelets: 248 10*3/uL (ref 145–400)
RBC: 4.16 10*6/uL (ref 3.70–5.45)
RDW: 15.8 % — ABNORMAL HIGH (ref 11.2–14.5)
WBC: 5.4 10*3/uL (ref 3.9–10.3)
lymph#: 1.3 10*3/uL (ref 0.9–3.3)
nRBC: 0 % (ref 0–0)

## 2017-08-06 LAB — COMPREHENSIVE METABOLIC PANEL
ALT: 14 U/L (ref 0–55)
AST: 14 U/L (ref 5–34)
Albumin: 3.8 g/dL (ref 3.5–5.0)
Alkaline Phosphatase: 77 U/L (ref 40–150)
Anion Gap: 8 mEq/L (ref 3–11)
BUN: 19.4 mg/dL (ref 7.0–26.0)
CO2: 27 mEq/L (ref 22–29)
Calcium: 9.9 mg/dL (ref 8.4–10.4)
Chloride: 108 mEq/L (ref 98–109)
Creatinine: 1.2 mg/dL — ABNORMAL HIGH (ref 0.6–1.1)
EGFR: 41 mL/min/{1.73_m2} — ABNORMAL LOW (ref >60)
Glucose: 102 mg/dl (ref 70–140)
Potassium: 4.1 mEq/L (ref 3.5–5.1)
Sodium: 143 mEq/L (ref 136–145)
Total Bilirubin: 0.71 mg/dL (ref 0.20–1.20)
Total Protein: 7.5 g/dL (ref 6.4–8.3)

## 2017-08-06 NOTE — Telephone Encounter (Signed)
Gave avs and calendar for may  °

## 2017-08-06 NOTE — Progress Notes (Signed)
  [facility] OFFICE PROGRESS NOTE   Diagnosis: Waldenstrom's macroglobulinemia  INTERVAL HISTORY:   Natasha Ellis returns as scheduled.  She reports malaise.  She recently had increased "edema".  This improved with an increased dose of Lasix, but she decreased the Lasix dose secondary to becoming dizzy.  No recent infection.  She has occasional soreness near the left submandibular gland.  Objective:  Vital signs in last 24 hours:  Blood pressure (!) 161/68, pulse 73, temperature (!) 97.4 F (36.3 C), temperature source Oral, resp. rate 18, height 5\' 6"  (1.676 m), weight 277 lb 6.4 oz (125.8 kg), SpO2 97 %.    HEENT: 3-4 mm round soft fullness near the inferior aspect of the left submandibular gland. Lymphatics: No cervical, supraclavicular, or axillary nodes Resp: End inspiratory rhonchi at the right posterior base, no respiratory distress Cardio: Regular rate and rhythm GI: No hepatosplenomegaly Vascular: No leg edema   Lab Results:  Lab Results  Component Value Date   WBC 5.4 08/06/2017   HGB 12.2 08/06/2017   HCT 36.7 08/06/2017   MCV 88.2 08/06/2017   PLT 248 08/06/2017   NEUTROABS 3.6 08/06/2017    [name] on 823 2018-1525  Medications: I have reviewed the patient's current medications.   Assessment/Plan: 1. 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 Treated with multiple systemic therapies includingpentostatin/Cytoxan/rituximab in 2010.   Cycle 1 bendamustine/Rituxan 05/31/2014.  Cycle 2 bendamustine/Rituxan 07/02/2014  Cycle 3 bendamustine/rituximab 07/30/2014  Cycle 4 bendamustine/rituximab 09/10/2014  Cycle 5 bendamustine/rituximab 10/22/2014  Cycle 6 bendamustine/rituximab 12/10/2014 2. Left knee arthritis. Followed by Dr. Wynelle Link. 3. Sleep apnea. 4. Anemia.  microcytic with peripheral blood smear findings consistent with iron deficiency 07/09/2015, serum iron studies consistent with "anemia of chronic disease ", stool Hemoccult cards negative 07/11/2015 5. Question left parotitis 12/12/2013 presenting with left facial and parotid edema. 6. Admission with pneumonia February 2016 7. History of Neutropenia secondary to chemotherapy, now with moderate neutropenia 8. Left lung pneumonia 04/02/2015, sputum culture positive for Moraxella 06/19/2015 9. Low IgG and IgA 10. CT chest 05/13/2015 with bilateral lower lung consolidation 11. CT chest, abdomen, and pelvis 01/08/2016-new peribronchial thickening and peripheral tree in bud pattern at the right lower lobe, no lymphadenopathy 12. Sinus mucosal thickening/opacification on CT 08/16/2015-status post sinus surgery 12/05/2015 13. Sputum culture positive for pseudomonas aeruginosa on 01/10/2016-treated with 10 days of ciprofloxacin 14. 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 Admission with pneumonia 08/26/2016   Disposition: She appears stable.  The hemoglobin is in the normal range.  We will follow-up on the [name] level from today.  She would like to continue every 12-month follow-up at the Cancer center.  There is no indication for treating the low-grade lymphoma at present.  15 minutes were spent with the patient today.  The majority of the time was used for counseling and coordination of care.  [name], MD  08/06/2017  12:01 PM

## 2017-08-07 LAB — IGM: IgM, Qn, Serum: 1512 mg/dL — ABNORMAL HIGH (ref 26–217)

## 2017-08-09 ENCOUNTER — Encounter: Payer: Self-pay | Admitting: Neurology

## 2017-08-09 ENCOUNTER — Ambulatory Visit (INDEPENDENT_AMBULATORY_CARE_PROVIDER_SITE_OTHER): Payer: Medicare Other | Admitting: Neurology

## 2017-08-09 ENCOUNTER — Telehealth: Payer: Self-pay | Admitting: Emergency Medicine

## 2017-08-09 VITALS — BP 110/70 | HR 69 | Ht 66.0 in | Wt 278.4 lb

## 2017-08-09 DIAGNOSIS — I6522 Occlusion and stenosis of left carotid artery: Secondary | ICD-10-CM | POA: Diagnosis not present

## 2017-08-09 DIAGNOSIS — I6389 Other cerebral infarction: Secondary | ICD-10-CM | POA: Diagnosis not present

## 2017-08-09 DIAGNOSIS — R29898 Other symptoms and signs involving the musculoskeletal system: Secondary | ICD-10-CM | POA: Diagnosis not present

## 2017-08-09 MED ORDER — EZETIMIBE 10 MG PO TABS: 10.0000 mg | ORAL_TABLET | Freq: Every day | ORAL | 0 refills | Status: DC

## 2017-08-09 NOTE — Telephone Encounter (Addendum)
Patient verbalized understanding of this note.   ----- Message from Natasha Pier, MD sent at 08/07/2017  5:17 PM EST ----- Please call patient, [name] is stable, f/u as scheduled
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
## 2017-08-09 NOTE — Patient Instructions (Signed)
Start zetia 10mg  daily  Continue plavix 75mg  daily  Return to clinic in 6 months

## 2017-08-10 LAB — PROTEIN ELECTROPHORESIS, SERUM
A/G Ratio: 1 (ref 0.7–1.7)
Albumin: 3.5 g/dL (ref 2.9–4.4)
Alpha 1: 0.3 g/dL (ref 0.0–0.4)
Alpha 2: 0.8 g/dL (ref 0.4–1.0)
Beta: 0.9 g/dL (ref 0.7–1.3)
Gamma Globulin: 1.5 g/dL (ref 0.4–1.8)
Globulin, Total: 3.5 g/dL (ref 2.2–3.9)
M-Spike, %: 1.3 g/dL — ABNORMAL HIGH
Total Protein: 7 g/dL (ref 6.0–8.5)

## 2017-08-17 DIAGNOSIS — Z8673 Personal history of transient ischemic attack (TIA), and cerebral infarction without residual deficits: Secondary | ICD-10-CM | POA: Diagnosis not present

## 2017-08-17 DIAGNOSIS — R5383 Other fatigue: Secondary | ICD-10-CM | POA: Diagnosis not present

## 2017-08-17 DIAGNOSIS — E7800 Pure hypercholesterolemia, unspecified: Secondary | ICD-10-CM | POA: Diagnosis not present

## 2017-08-17 DIAGNOSIS — G4733 Obstructive sleep apnea (adult) (pediatric): Secondary | ICD-10-CM | POA: Diagnosis not present

## 2017-08-17 DIAGNOSIS — R0609 Other forms of dyspnea: Secondary | ICD-10-CM | POA: Diagnosis not present

## 2017-09-02 DIAGNOSIS — M6281 Muscle weakness (generalized): Secondary | ICD-10-CM | POA: Diagnosis not present

## 2017-09-02 DIAGNOSIS — M170 Bilateral primary osteoarthritis of knee: Secondary | ICD-10-CM | POA: Diagnosis not present

## 2017-09-03 ENCOUNTER — Ambulatory Visit: Payer: Medicare Other | Admitting: Pulmonary Disease

## 2017-09-05 DIAGNOSIS — M170 Bilateral primary osteoarthritis of knee: Secondary | ICD-10-CM | POA: Diagnosis not present

## 2017-09-05 DIAGNOSIS — M6281 Muscle weakness (generalized): Secondary | ICD-10-CM | POA: Diagnosis not present

## 2017-09-07 DIAGNOSIS — M6281 Muscle weakness (generalized): Secondary | ICD-10-CM | POA: Diagnosis not present

## 2017-09-07 DIAGNOSIS — M170 Bilateral primary osteoarthritis of knee: Secondary | ICD-10-CM | POA: Diagnosis not present

## 2017-09-09 DIAGNOSIS — M6281 Muscle weakness (generalized): Secondary | ICD-10-CM | POA: Diagnosis not present

## 2017-09-09 DIAGNOSIS — M170 Bilateral primary osteoarthritis of knee: Secondary | ICD-10-CM | POA: Diagnosis not present

## 2017-09-12 DIAGNOSIS — M170 Bilateral primary osteoarthritis of knee: Secondary | ICD-10-CM | POA: Diagnosis not present

## 2017-09-12 DIAGNOSIS — M6281 Muscle weakness (generalized): Secondary | ICD-10-CM | POA: Diagnosis not present

## 2017-09-14 DIAGNOSIS — M6281 Muscle weakness (generalized): Secondary | ICD-10-CM | POA: Diagnosis not present

## 2017-09-14 DIAGNOSIS — M170 Bilateral primary osteoarthritis of knee: Secondary | ICD-10-CM | POA: Diagnosis not present

## 2017-09-16 DIAGNOSIS — M170 Bilateral primary osteoarthritis of knee: Secondary | ICD-10-CM | POA: Diagnosis not present

## 2017-09-16 DIAGNOSIS — M6281 Muscle weakness (generalized): Secondary | ICD-10-CM | POA: Diagnosis not present

## 2017-09-19 DIAGNOSIS — M6281 Muscle weakness (generalized): Secondary | ICD-10-CM | POA: Diagnosis not present

## 2017-09-19 DIAGNOSIS — M170 Bilateral primary osteoarthritis of knee: Secondary | ICD-10-CM | POA: Diagnosis not present

## 2017-09-21 DIAGNOSIS — M170 Bilateral primary osteoarthritis of knee: Secondary | ICD-10-CM | POA: Diagnosis not present

## 2017-09-21 DIAGNOSIS — M6281 Muscle weakness (generalized): Secondary | ICD-10-CM | POA: Diagnosis not present

## 2017-09-23 DIAGNOSIS — M6281 Muscle weakness (generalized): Secondary | ICD-10-CM | POA: Diagnosis not present

## 2017-09-23 DIAGNOSIS — M170 Bilateral primary osteoarthritis of knee: Secondary | ICD-10-CM | POA: Diagnosis not present

## 2017-09-26 DIAGNOSIS — M6281 Muscle weakness (generalized): Secondary | ICD-10-CM | POA: Diagnosis not present

## 2017-09-26 DIAGNOSIS — M170 Bilateral primary osteoarthritis of knee: Secondary | ICD-10-CM | POA: Diagnosis not present

## 2017-09-28 DIAGNOSIS — M170 Bilateral primary osteoarthritis of knee: Secondary | ICD-10-CM | POA: Diagnosis not present

## 2017-09-28 DIAGNOSIS — M6281 Muscle weakness (generalized): Secondary | ICD-10-CM | POA: Diagnosis not present

## 2017-09-30 DIAGNOSIS — M6281 Muscle weakness (generalized): Secondary | ICD-10-CM | POA: Diagnosis not present

## 2017-09-30 DIAGNOSIS — M170 Bilateral primary osteoarthritis of knee: Secondary | ICD-10-CM | POA: Diagnosis not present

## 2017-10-03 DIAGNOSIS — M170 Bilateral primary osteoarthritis of knee: Secondary | ICD-10-CM | POA: Diagnosis not present

## 2017-10-03 DIAGNOSIS — M6281 Muscle weakness (generalized): Secondary | ICD-10-CM | POA: Diagnosis not present

## 2017-10-05 DIAGNOSIS — M170 Bilateral primary osteoarthritis of knee: Secondary | ICD-10-CM | POA: Diagnosis not present

## 2017-10-05 DIAGNOSIS — M6281 Muscle weakness (generalized): Secondary | ICD-10-CM | POA: Diagnosis not present

## 2017-10-06 NOTE — Telephone Encounter (Signed)
Error

## 2017-10-07 DIAGNOSIS — M6281 Muscle weakness (generalized): Secondary | ICD-10-CM | POA: Diagnosis not present

## 2017-10-07 DIAGNOSIS — M170 Bilateral primary osteoarthritis of knee: Secondary | ICD-10-CM | POA: Diagnosis not present

## 2017-10-12 DIAGNOSIS — M170 Bilateral primary osteoarthritis of knee: Secondary | ICD-10-CM | POA: Diagnosis not present

## 2017-10-12 DIAGNOSIS — M6281 Muscle weakness (generalized): Secondary | ICD-10-CM | POA: Diagnosis not present

## 2017-10-14 DIAGNOSIS — M6281 Muscle weakness (generalized): Secondary | ICD-10-CM | POA: Diagnosis not present

## 2017-10-14 DIAGNOSIS — M170 Bilateral primary osteoarthritis of knee: Secondary | ICD-10-CM | POA: Diagnosis not present

## 2017-10-17 DIAGNOSIS — M6281 Muscle weakness (generalized): Secondary | ICD-10-CM | POA: Diagnosis not present

## 2017-10-17 DIAGNOSIS — M170 Bilateral primary osteoarthritis of knee: Secondary | ICD-10-CM | POA: Diagnosis not present

## 2017-10-18 ENCOUNTER — Telehealth: Payer: Self-pay | Admitting: Neurology

## 2017-10-18 NOTE — Telephone Encounter (Signed)
Pt called and said her legs are not working and is wondering if she has post polio syndrome, not getting any better, she wants to see Dr Posey Pronto sooner than her July appointment, please call and advise

## 2017-10-19 DIAGNOSIS — M170 Bilateral primary osteoarthritis of knee: Secondary | ICD-10-CM | POA: Diagnosis not present

## 2017-10-19 DIAGNOSIS — M6281 Muscle weakness (generalized): Secondary | ICD-10-CM | POA: Diagnosis not present

## 2017-10-19 NOTE — Telephone Encounter (Signed)
Patient states that she has had pain in her legs for 1 1/2 to 2 years.  She has been having massages and electric stimulation by therapist.  She is worried that she has post polio syndrome.  Please advise.

## 2017-10-19 NOTE — Telephone Encounter (Signed)
I will need to evaluate her in the clinic.  Please add her to the cancellation list and we will call her with a sooner appointment.

## 2017-10-20 NOTE — Telephone Encounter (Signed)
I called patient back and informed her that we will put her on the waiting list.

## 2017-10-20 NOTE — Telephone Encounter (Signed)
Pt called and left a message saying she has not heard back from Dr Posey Pronto about the problem she was having

## 2017-10-21 DIAGNOSIS — M6281 Muscle weakness (generalized): Secondary | ICD-10-CM | POA: Diagnosis not present

## 2017-10-21 DIAGNOSIS — M170 Bilateral primary osteoarthritis of knee: Secondary | ICD-10-CM | POA: Diagnosis not present

## 2017-10-27 DIAGNOSIS — K1379 Other lesions of oral mucosa: Secondary | ICD-10-CM | POA: Diagnosis not present

## 2017-10-27 DIAGNOSIS — J189 Pneumonia, unspecified organism: Secondary | ICD-10-CM | POA: Diagnosis not present

## 2017-10-27 DIAGNOSIS — R0602 Shortness of breath: Secondary | ICD-10-CM | POA: Diagnosis not present

## 2017-10-27 DIAGNOSIS — R05 Cough: Secondary | ICD-10-CM | POA: Diagnosis not present

## 2017-11-01 DIAGNOSIS — M6281 Muscle weakness (generalized): Secondary | ICD-10-CM | POA: Diagnosis not present

## 2017-11-01 DIAGNOSIS — M170 Bilateral primary osteoarthritis of knee: Secondary | ICD-10-CM | POA: Diagnosis not present

## 2017-11-02 DIAGNOSIS — M6281 Muscle weakness (generalized): Secondary | ICD-10-CM | POA: Diagnosis not present

## 2017-11-02 DIAGNOSIS — M170 Bilateral primary osteoarthritis of knee: Secondary | ICD-10-CM | POA: Diagnosis not present

## 2017-11-04 DIAGNOSIS — M6281 Muscle weakness (generalized): Secondary | ICD-10-CM | POA: Diagnosis not present

## 2017-11-04 DIAGNOSIS — M170 Bilateral primary osteoarthritis of knee: Secondary | ICD-10-CM | POA: Diagnosis not present

## 2017-11-09 DIAGNOSIS — M170 Bilateral primary osteoarthritis of knee: Secondary | ICD-10-CM | POA: Diagnosis not present

## 2017-11-09 DIAGNOSIS — M6281 Muscle weakness (generalized): Secondary | ICD-10-CM | POA: Diagnosis not present

## 2017-11-11 DIAGNOSIS — M6281 Muscle weakness (generalized): Secondary | ICD-10-CM | POA: Diagnosis not present

## 2017-11-11 DIAGNOSIS — M170 Bilateral primary osteoarthritis of knee: Secondary | ICD-10-CM | POA: Diagnosis not present

## 2017-11-14 DIAGNOSIS — M170 Bilateral primary osteoarthritis of knee: Secondary | ICD-10-CM | POA: Diagnosis not present

## 2017-11-14 DIAGNOSIS — M6281 Muscle weakness (generalized): Secondary | ICD-10-CM | POA: Diagnosis not present

## 2017-11-16 DIAGNOSIS — M170 Bilateral primary osteoarthritis of knee: Secondary | ICD-10-CM | POA: Diagnosis not present

## 2017-11-16 DIAGNOSIS — M6281 Muscle weakness (generalized): Secondary | ICD-10-CM | POA: Diagnosis not present

## 2017-11-19 DIAGNOSIS — M170 Bilateral primary osteoarthritis of knee: Secondary | ICD-10-CM | POA: Diagnosis not present

## 2017-11-19 DIAGNOSIS — M6281 Muscle weakness (generalized): Secondary | ICD-10-CM | POA: Diagnosis not present

## 2017-11-24 DIAGNOSIS — M170 Bilateral primary osteoarthritis of knee: Secondary | ICD-10-CM | POA: Diagnosis not present

## 2017-11-24 DIAGNOSIS — M6281 Muscle weakness (generalized): Secondary | ICD-10-CM | POA: Diagnosis not present

## 2017-12-06 ENCOUNTER — Inpatient Hospital Stay: Payer: Medicare Other | Attending: Oncology | Admitting: Oncology

## 2017-12-06 ENCOUNTER — Telehealth: Payer: Self-pay | Admitting: Oncology

## 2017-12-06 ENCOUNTER — Inpatient Hospital Stay: Payer: Medicare Other

## 2017-12-06 VITALS — BP 139/67 | HR 68 | Temp 97.8°F | Resp 18 | Ht 66.0 in | Wt 283.9 lb

## 2017-12-06 DIAGNOSIS — C880 Waldenstrom macroglobulinemia: Secondary | ICD-10-CM | POA: Insufficient documentation

## 2017-12-06 DIAGNOSIS — Z8572 Personal history of non-Hodgkin lymphomas: Secondary | ICD-10-CM

## 2017-12-06 DIAGNOSIS — Z8673 Personal history of transient ischemic attack (TIA), and cerebral infarction without residual deficits: Secondary | ICD-10-CM | POA: Insufficient documentation

## 2017-12-06 LAB — CBC WITH DIFFERENTIAL/PLATELET
Basophils Absolute: 0 10*3/uL (ref 0.0–0.1)
Basophils Relative: 0 %
Eosinophils Absolute: 0.1 10*3/uL (ref 0.0–0.5)
Eosinophils Relative: 2 %
HCT: 34.9 % (ref 34.8–46.6)
Hemoglobin: 11.6 g/dL (ref 11.6–15.9)
Lymphocytes Relative: 24 %
Lymphs Abs: 1.1 10*3/uL (ref 0.9–3.3)
MCH: 30 pg (ref 25.1–34.0)
MCHC: 33.2 g/dL (ref 31.5–36.0)
MCV: 90.2 fL (ref 79.5–101.0)
Monocytes Absolute: 0.2 10*3/uL (ref 0.1–0.9)
Monocytes Relative: 5 %
Neutro Abs: 3.3 10*3/uL (ref 1.5–6.5)
Neutrophils Relative %: 69 %
Platelets: 233 10*3/uL (ref 145–400)
RBC: 3.87 MIL/uL (ref 3.70–5.45)
RDW: 15.5 % — ABNORMAL HIGH (ref 11.2–14.5)
WBC: 4.7 10*3/uL (ref 3.9–10.3)

## 2017-12-06 LAB — COMPREHENSIVE METABOLIC PANEL
ALT: 12 U/L (ref 0–55)
AST: 16 U/L (ref 5–34)
Albumin: 3.8 g/dL (ref 3.5–5.0)
Alkaline Phosphatase: 69 U/L (ref 40–150)
Anion gap: 7 (ref 3–11)
BUN: 17 mg/dL (ref 7–26)
CO2: 28 mmol/L (ref 22–29)
Calcium: 9.8 mg/dL (ref 8.4–10.4)
Chloride: 109 mmol/L (ref 98–109)
Creatinine, Ser: 1.27 mg/dL — ABNORMAL HIGH (ref 0.60–1.10)
GFR calc Af Amer: 44 mL/min — ABNORMAL LOW (ref >60)
GFR calc non Af Amer: 38 mL/min — ABNORMAL LOW (ref >60)
Glucose, Bld: 94 mg/dL (ref 70–140)
Potassium: 3.8 mmol/L (ref 3.5–5.1)
Sodium: 144 mmol/L (ref 136–145)
Total Bilirubin: 0.7 mg/dL (ref 0.2–1.2)
Total Protein: 7.1 g/dL (ref 6.4–8.3)

## 2017-12-06 NOTE — Progress Notes (Signed)
[place] OFFICE PROGRESS NOTE   Diagnosis: WaldenStrom's macroglobulinemia  INTERVAL HISTORY:   [name] returns as scheduled.  She reports being treated for pneumonia in March.  No fever or night sweats.  Good appetite.  She has chronic pain and weakness in both legs.  She thinks this may be related to polio when she was young.  She is scheduled to see neurology later this week.  Objective:  Vital signs in last 24 hours:  Blood pressure (!) 178/80, pulse 68, temperature 97.8 F (36.6 C), temperature source Oral, resp. rate 18, height 5\' 6"  (1.676 m), weight 283 lb 14.4 oz (128.8 kg), SpO2 98 %.    HEENT: Less than 1 cm soft circumscribed fullness inferior to the left submandibular gland. Lymphatics: No cervical, supraclavicular, axillary, or inguinal nodes Resp: Lungs clear bilaterally Cardio: Regular rate and rhythm GI: No hepatosplenomegaly Vascular: No leg edema    Portacath/PICC-without erythema  Lab Results:  Lab Results  Component Value Date   WBC 4.7 12/06/2017   HGB 11.6 12/06/2017   HCT 34.9 12/06/2017   MCV 90.2 12/06/2017   PLT 233 12/06/2017   NEUTROABS 3.3 12/06/2017    CMP     Component Value Date/Time   NA 144 12/06/2017 1007   NA 143 08/06/2017 1125   K 3.8 12/06/2017 1007   K 4.1 08/06/2017 1125   CL 109 12/06/2017 1007   CL 107 10/24/2012 1056   CO2 28 12/06/2017 1007   CO2 27 08/06/2017 1125   GLUCOSE 94 12/06/2017 1007   GLUCOSE 102 08/06/2017 1125   GLUCOSE 125 (H) 10/24/2012 1056   BUN 17 12/06/2017 1007   BUN 19.4 08/06/2017 1125   CREATININE 1.27 (H) 12/06/2017 1007   CREATININE 1.2 (H) 08/06/2017 1125   CALCIUM 9.8 12/06/2017 1007   CALCIUM 9.9 08/06/2017 1125   PROT 7.1 12/06/2017 1007   PROT 7.0 08/06/2017 1125   PROT 7.5 08/06/2017 1125   ALBUMIN 3.8 12/06/2017 1007   ALBUMIN 3.8 08/06/2017 1125   AST 16 12/06/2017 1007   AST 14 08/06/2017 1125   ALT 12 12/06/2017 1007   ALT 14 08/06/2017 1125   ALKPHOS 69 12/06/2017 1007   ALKPHOS 77 08/06/2017 1125   BILITOT 0.7 12/06/2017 1007   BILITOT 0.71 08/06/2017 1125   GFRNONAA 38 (L) 12/06/2017 1007   GFRAA 44 (L) 12/06/2017 1007    No results found for: CEA1  Lab Results  Component Value Date   INR 1.26 08/26/2016    Imaging:  No results found.  Medications: I have reviewed the patient's current medications.   Assessment/Plan: 1. 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 Treated with multiple systemic therapies includingpentostatin/Cytoxan/rituximab in 2010.   Cycle 1 bendamustine/Rituxan 05/31/2014.  Cycle 2 bendamustine/Rituxan 07/02/2014  Cycle 3 bendamustine/rituximab 07/30/2014  Cycle 4 bendamustine/rituximab 09/10/2014  Cycle 5 bendamustine/rituximab 10/22/2014  Cycle 6 bendamustine/rituximab 12/10/2014 2. Left knee arthritis. Followed by Dr. Wynelle Link. 3. Sleep apnea. 4. Anemia. microcytic with peripheral blood smear findings consistent with iron deficiency 07/09/2015, serum iron studies consistent with "anemia of chronic disease ", stool Hemoccult cards negative 07/11/2015 5. Question left parotitis 12/12/2013 presenting with left facial and parotid edema. 6. Admission with pneumonia February 2016 7. History of Neutropenia secondary to chemotherapy, now with moderate neutropenia 8. Left lung pneumonia 04/02/2015, sputum culture positive for  Moraxella 06/19/2015 9. Low IgG and IgA 10. CT chest 05/13/2015 with bilateral lower lung consolidation 11. CT chest, abdomen, and pelvis 01/08/2016-new peribronchial thickening and peripheral tree in bud pattern at the right lower lobe, no lymphadenopathy 12. Sinus mucosal thickening/opacification on CT 08/16/2015-status post sinus surgery 12/05/2015 13. Sputum culture positive for  pseudomonas aeruginosa on 01/10/2016-treated with 10 days of ciprofloxacin 14. 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 Admission with pneumonia 08/26/2016   Disposition: Ms. [name] appears unchanged.  She is asymptomatic from the low-grade lymphoma.  No evidence of disease progression.  We will follow-up on the [name] level from today.  She will return for an office and lab visit in 6 months.  15 minutes were spent with the patient today.  The majority of the time was used for counseling and coordination of care.  [name], MD  12/06/2017  11:00 AM

## 2017-12-06 NOTE — Telephone Encounter (Signed)
Scheduled apt per 5/6 los - pt aware - no print out wanted.

## 2017-12-07 DIAGNOSIS — I6522 Occlusion and stenosis of left carotid artery: Secondary | ICD-10-CM | POA: Diagnosis not present

## 2017-12-07 DIAGNOSIS — Z0000 Encounter for general adult medical examination without abnormal findings: Secondary | ICD-10-CM | POA: Diagnosis not present

## 2017-12-07 DIAGNOSIS — G4733 Obstructive sleep apnea (adult) (pediatric): Secondary | ICD-10-CM | POA: Diagnosis not present

## 2017-12-07 DIAGNOSIS — M79604 Pain in right leg: Secondary | ICD-10-CM | POA: Diagnosis not present

## 2017-12-07 DIAGNOSIS — R609 Edema, unspecified: Secondary | ICD-10-CM | POA: Diagnosis not present

## 2017-12-07 DIAGNOSIS — M8580 Other specified disorders of bone density and structure, unspecified site: Secondary | ICD-10-CM | POA: Diagnosis not present

## 2017-12-07 DIAGNOSIS — M79605 Pain in left leg: Secondary | ICD-10-CM | POA: Diagnosis not present

## 2017-12-07 DIAGNOSIS — Z1389 Encounter for screening for other disorder: Secondary | ICD-10-CM | POA: Diagnosis not present

## 2017-12-07 LAB — IGM: IgM (Immunoglobulin M), Srm: 1507 mg/dL — ABNORMAL HIGH (ref 26–217)

## 2017-12-08 ENCOUNTER — Telehealth: Payer: Self-pay

## 2017-12-08 LAB — PROTEIN ELECTROPHORESIS, SERUM
A/G Ratio: 1.2 (ref 0.7–1.7)
Albumin ELP: 3.5 g/dL (ref 2.9–4.4)
Alpha-1-Globulin: 0.2 g/dL (ref 0.0–0.4)
Alpha-2-Globulin: 0.7 g/dL (ref 0.4–1.0)
Beta Globulin: 0.8 g/dL (ref 0.7–1.3)
Gamma Globulin: 1.3 g/dL (ref 0.4–1.8)
Globulin, Total: 3 g/dL (ref 2.2–3.9)
M-Spike, %: 1 g/dL — ABNORMAL HIGH
Total Protein ELP: 6.5 g/dL (ref 6.0–8.5)

## 2017-12-08 NOTE — Telephone Encounter (Addendum)
Pt voiced understanding-  ---- Message from Ladell Pier, MD sent at 12/07/2017  7:44 AM EDT ----- Please call patient, [name] is stable

## 2017-12-09 ENCOUNTER — Encounter: Payer: Self-pay | Admitting: Neurology

## 2017-12-09 ENCOUNTER — Encounter

## 2017-12-09 ENCOUNTER — Ambulatory Visit (INDEPENDENT_AMBULATORY_CARE_PROVIDER_SITE_OTHER): Payer: Medicare Other | Admitting: Neurology

## 2017-12-09 VITALS — BP 136/74 | HR 69 | Ht 65.75 in | Wt 284.0 lb

## 2017-12-09 DIAGNOSIS — M79604 Pain in right leg: Secondary | ICD-10-CM

## 2017-12-09 DIAGNOSIS — I6522 Occlusion and stenosis of left carotid artery: Secondary | ICD-10-CM

## 2017-12-09 DIAGNOSIS — M79605 Pain in left leg: Secondary | ICD-10-CM | POA: Diagnosis not present

## 2017-12-09 DIAGNOSIS — I6389 Other cerebral infarction: Secondary | ICD-10-CM

## 2017-12-09 NOTE — Progress Notes (Signed)
Follow-up Visit   Date: 12/09/17    Natasha Ellis MRN: [id number] DOB: 01/15/1931   Interim History: Natasha Ellis is a 84 y.o.  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  The patient was accompanied to the clinic by daughter who also provides collateral information.    History of present illness: Around the end of May 2017, she was talking on the phone and suddenly, the phone slipped out of her hand.  She reached to pick it up, but again, she was unable to hold it and it slipped.  She did not have strong grip with her left hand, so ultimately used her right hand to finish her conversation.  Since this time, she has felt that her left hand is weaker and feels "heavy". She has been able to carry a jug of milk, but has difficulty with fine motor movements such as picking up her pill. Her weakness has not progressed since onset, but there has been no improvement.  Occasionally, she feels that the left side of her face droops and she drools on this side.  She does not have neck pain or leg weakness.  She has been walking with a cane since 2011 after knee surgery.  MRI brain confirmed R centrum semiovale stroke. Stroke work-up showed L ICA stenosis.    She also has involuntary cramping of the left 3-5th digits, about 3-5 minutes and has to manually try to massage and rub her hand.  She reports having two TIAs in 2014 manifesting with difficulty speaking and altered mental status while being treated with narcotics and valium for low back pain. MRI brain did not show acute stroke, there was chronic R MCA ischemic changes. MRA head was within normal limits.  She has been on plavix 75mg  daily since this time.   UPDATE 08/09/2017:   She is here for follow-up visit.  She complains of cold sensation of both hands, which is intermittent.  There is no numbness/tingling.  Her left hand continues to be weak from her stroke, without any ongoing improvement.  She self-discontinued lipitor due to diarrhea and does not wish to be on any other statin therapy.   UPDATE 12/09/2017:  She scheduled sooner visit due bilateral leg pain.  Over the past 6 months, she has achy and sore bilateral lower leg pain which is worse with exertion and present at rest.  If she does any walking or water exercises, her legs are especially sore 1-2 days later.  She has been evaluated by Dr. Maureen Ralphs for bilateral leg pain and was told she had knee arthritis and left knee ligament injury.  She does not have burning, stabbing, or numbness.   She has some relief with tylenol and NSAIDs.  She does not have leg weakness but is having greater difficulty walking and getting up from a low chair because of reduced endurance and shortness of breath.  She continues to walk with a walker.  In 1961, she was 84 years old and had a fever which she treated with tylenol. She was eventually diagnosed with subclinical polio and is worried that she may have post-polio syndrome.     Medications:  Current Outpatient Medications on File Prior to Visit  Medication Sig Dispense Refill  . acetaminophen (TYLENOL) 500 MG tablet Take 1,000 mg by  mouth every 6 (six) hours as needed for pain.     Marland Kitchen albuterol (PROAIR HFA) 108 (90 Base) MCG/ACT inhaler Inhale 2 puffs into the lungs every 6 (six) hours as needed for wheezing or shortness of breath. 1 Inhaler 3  . albuterol (PROVENTIL) (2.5 MG/3ML) 0.083% nebulizer solution Take 3 mLs (2.5 mg total) by nebulization every 4 (four) hours as needed for wheezing or shortness of breath. 75 mL 1  . alendronate (FOSAMAX) 70 MG tablet Take 70 mg by mouth once a week. Sundays    . calcium gluconate 500 MG tablet Take 1 tablet by mouth daily.    .  cholestyramine light (PREVALITE) 4 g packet Take 1 packet by mouth as needed.    . clopidogrel (PLAVIX) 75 MG tablet Take 1 tablet (75 mg total) by mouth daily. 90 tablet 3  . ferrous sulfate 325 (65 FE) MG tablet Take 325 mg by mouth daily.     . fluticasone (FLONASE) 50 MCG/ACT nasal spray Place 1 spray into both nostrils daily. 1 g 0  . furosemide (LASIX) 40 MG tablet Take 20 mg by mouth as needed. May take 40 mg as needed.    . potassium chloride (K-DUR) 10 MEQ tablet Take 10 mEq by mouth daily.    . potassium chloride (K-DUR,KLOR-CON) 10 MEQ tablet     . sodium chloride (OCEAN) 0.65 % SOLN nasal spray Place 1 spray into both nostrils 3 (three) times daily as needed for congestion.     No current facility-administered medications on file prior to visit.     Allergies:  Allergies  Allergen Reactions  . Codeine Other (See Comments)    Crazy-nightmares  . Amoxicillin Diarrhea    Has patient had a PCN reaction causing immediate rash, facial/tongue/throat swelling, SOB or lightheadedness with hypotension:  No Has patient had a PCN reaction causing severe rash involving mucus membranes or skin necrosis: No Has patient had a PCN reaction that required hospitalization No Has patient had a PCN reaction occurring within the last 10 years: Unknown--rx is diarrhea. If all of the above answers are "NO", then may proceed with Cephalosporin use.    . Cefdinir Cough  . Doxycycline Diarrhea  . Erythromycin Other (See Comments)    GI upset  . Lisinopril Cough  . Lisinopril     unknown  . Oxybutynin Other (See Comments)    Dry mouth  . Sulfa Antibiotics Other (See Comments)    GI upset  . Sulfacetamide Sodium Other (See Comments)    GI upset    Review of Systems:  CONSTITUTIONAL: No fevers, chills, night sweats, or weight loss.  EYES: No visual changes or eye pain ENT: No hearing changes.  No history of nose bleeds.   RESPIRATORY: No cough, wheezing and shortness of breath.     CARDIOVASCULAR: Negative for chest pain, and palpitations.   GI: Negative for abdominal discomfort, blood in stools or black stools.  No recent change in bowel habits.   GU:  No history of incontinence.   MUSCLOSKELETAL: No history of joint pain or swelling.  No myalgias.   SKIN: Negative for lesions, rash, and itching.   ENDOCRINE: Negative for cold or heat intolerance, polydipsia or goiter.   PSYCH:  No depression or anxiety symptoms.   NEURO: As Above.   Vital Signs:  BP 136/74   Pulse 69   Ht 5' 5.75" (1.67 m)   Wt 284 lb (128.8 kg)   SpO2 95%   BMI 46.19 kg/m  General:  Well appearing, sitting on rollator Ext:  Warm legs with pitting edema.  ROM of L hip flexion and knee extension is reduced  Neurological Exam: MENTAL STATUS including orientation to time, place, person, recent and remote memory, attention span and concentration, language, and fund of knowledge is normal.  Speech is not dysarthric.  CRANIAL NERVES:  Pupils equal round and reactive to light.  Normal conjugate, extra-ocular eye movements in all directions of gaze.  Mild ptosis  Face is symmetric. Palate elevates symmetrically.  Tongue is midline.  MOTOR:  No atrophy, fasciculations or abnormal movements.  No pronator drift.  Tone is normal.    Right Upper Extremity:    Left Upper Extremity:    Deltoid  5/5   Deltoid  5/5   Biceps  5/5   Biceps  5/5   Triceps  5/5   Triceps  5/5   Wrist extensors  5/5   Wrist extensors  5/5   Wrist flexors  5/5   Wrist flexors  5/5   Finger extensors  5/5   Finger extensors  4+/5   Finger flexors  5/5   Finger flexors  4+/5   Dorsal interossei  5/5   Dorsal interossei  4+/5   Abductor pollicis  5/5   Abductor pollicis  4+/5   Tone (Ashworth scale)  0  Tone (Ashworth scale)  0   Right Lower Extremity:    Left Lower Extremity:    Hip flexors  5/5   Hip flexors  5/5   Hip extensors  5/5   Hip extensors  5/5   Knee flexors  5/5   Knee flexors  5/5   Knee extensors  5/5    Knee extensors  5/5   Dorsiflexors  5/5   Dorsiflexors  5/5   Plantarflexors  5/5   Plantarflexors  5/5   Toe extensors  5/5   Toe extensors  5/5   Toe flexors  5/5   Toe flexors  5/5   Tone (Ashworth scale)  0  Tone (Ashworth scale)  0    MSRs:  Reflexes are 2+/4 in the upper extremities, except absent in the legs.  COORDINATION/GAIT: Gait is wide-based, slow and stable assisted with rollator, antalgic with guarding of the left leg  Data:  MRI lumbar spine wo contrast 04/28/2013: 1. Transitional anatomy, with numbering system on this study confirmed by Previous CT chest and CT abdomen and pelvis. Correlation with radiographs is recommended prior to any operative intervention. 2. Mild grade 1 spondylolisthesis at L4-L5 with disc and facet degeneration resulting in moderate spinal stenosis and mild to moderate right greater than left foraminal stenosis. 3. Multifactorial mild to moderate L3-L4 spinal and right greater than left foraminal stenosis.  US carotids 3/81/8299:  RICA 3-71%, LICA 69-67% stenosis  US carotids 8/93/8101:  RICA 7-51%, LICA upper end of 02-58* - progressed  MRI/A brain 04/03/2016: Acute or subacute punctate deep white matter infarcts, RIGHT centrum semiovale. Widespread areas of chronic ischemia in the RIGHT hemisphere, RIGHT MCA territory. Atrophy and small vessel disease. No proximal large vessel occlusion or flow-limiting stenosis.  Intracranial atherosclerotic change affects both distal MCA segments.  Lab Results  Component Value Date   CHOL 123 05/05/2017   HDL 38.80 (L) 05/05/2017   LDLCALC 68 05/05/2017   TRIG 83.0 05/05/2017   CHOLHDL 3 05/05/2017    IMPRESSION/PLAN: 1.  Bilateral achy leg pain is most consistent with musculoskeletal pain.  Reassured patient that her pain does not have  neuropathic features and her exam does not show signs of spinal stenosis, lumbar radiculopathy, or worsening neuropathy.  She is relieved that she does not have  post-polio syndrome, as this manifests with weakness or previously affected muscle groups, not pain. No neurological testing is indicated.   Because of exertional pain, vascular claudication is a possibility but I would expect this to improve with rest.  I will request the opinion of further testing such as arterial studies to her PCP.   Recommend f/u with Dr. Maureen Ralphs for knee pain.    2.  Cryptogenic right centrum semiovale stroke (03/2016) with residual left hand weakness.  Stroke work-up looking for central embolic source with TTE and 30-day cardiac monitor is normal.  There is progression of LICA stenosis on the upper range of 40-59%, which will be recheck in 6 months.  Continue secondary stroke prevention with plavix 75mg  daily.  She did not tolerate Lipitor (diarrhea) or zetia 10mg  daily.  2.  Left ICA stenosis is 40-59% with higher velocity than in 2017.  Asymptomatic.  Repeat US carotids in 6 months.  If she has new R sided weakness/paresthesias or left visual loss, refer to Vascular Surgery for evaluation.    3.  Idiopathic peripheral neuropathy of the legs, stable and without painful paresthesia.  Return to clinic in 6 months  Greater than 50% of this 30 minute visit was spent in counseling, explanation of diagnosis, planning of further management, and coordination of care.   Thank you for allowing me to participate in patient's care.  If I can answer any additional questions, I would be pleased to do so.    Sincerely,    Asya Derryberry K. Posey Pronto, DO

## 2017-12-09 NOTE — Patient Instructions (Signed)
I will send a message to Dr. Laurann Montana about possibly getting arterial studies of the legs to check circulation  Return to clinic in 6 months

## 2017-12-10 ENCOUNTER — Telehealth: Payer: Self-pay

## 2017-12-10 NOTE — Telephone Encounter (Addendum)
Left VM msg. In regards to note below. Advised pt to please call back.  ----- Message from Ladell Pier, MD sent at 12/08/2017  7:22 PM EDT ----- Please call patient, the [name] level and serum M spike are stable

## 2017-12-10 NOTE — Telephone Encounter (Addendum)
Spoke with Natasha Ellis in regards to the note below. Natasha Ellis verbalized understanding of the lab results.  ----- Message from Ladell Pier, MD sent at 12/08/2017  7:22 PM EDT ----- Please call patient, the [name] level and serum M spike are stable

## 2017-12-24 DIAGNOSIS — M1712 Unilateral primary osteoarthritis, left knee: Secondary | ICD-10-CM | POA: Diagnosis not present

## 2017-12-24 DIAGNOSIS — M1711 Unilateral primary osteoarthritis, right knee: Secondary | ICD-10-CM | POA: Diagnosis not present

## 2017-12-31 DIAGNOSIS — M1712 Unilateral primary osteoarthritis, left knee: Secondary | ICD-10-CM | POA: Diagnosis not present

## 2018-01-06 DIAGNOSIS — M1712 Unilateral primary osteoarthritis, left knee: Secondary | ICD-10-CM | POA: Diagnosis not present

## 2018-01-10 DIAGNOSIS — M25562 Pain in left knee: Secondary | ICD-10-CM | POA: Diagnosis not present

## 2018-01-18 DIAGNOSIS — M25562 Pain in left knee: Secondary | ICD-10-CM | POA: Diagnosis not present

## 2018-01-25 DIAGNOSIS — M25562 Pain in left knee: Secondary | ICD-10-CM | POA: Diagnosis not present

## 2018-01-26 DIAGNOSIS — M8588 Other specified disorders of bone density and structure, other site: Secondary | ICD-10-CM | POA: Diagnosis not present

## 2018-02-01 DIAGNOSIS — M25562 Pain in left knee: Secondary | ICD-10-CM | POA: Diagnosis not present

## 2018-02-07 ENCOUNTER — Ambulatory Visit: Payer: Medicare Other | Admitting: Neurology

## 2018-02-08 DIAGNOSIS — M25562 Pain in left knee: Secondary | ICD-10-CM | POA: Diagnosis not present

## 2018-02-16 DIAGNOSIS — M25562 Pain in left knee: Secondary | ICD-10-CM | POA: Diagnosis not present

## 2018-02-17 DIAGNOSIS — R6889 Other general symptoms and signs: Secondary | ICD-10-CM | POA: Diagnosis not present

## 2018-02-17 DIAGNOSIS — R0609 Other forms of dyspnea: Secondary | ICD-10-CM | POA: Diagnosis not present

## 2018-02-21 ENCOUNTER — Ambulatory Visit
Admission: RE | Admit: 2018-02-21 | Discharge: 2018-02-21 | Disposition: A | Discharge status: Home / Self Care | Payer: Medicare Other | Source: Ambulatory Visit | Attending: Internal Medicine | Admitting: Internal Medicine

## 2018-02-21 ENCOUNTER — Other Ambulatory Visit: Payer: Self-pay | Admitting: Internal Medicine

## 2018-02-21 DIAGNOSIS — R0609 Other forms of dyspnea: Principal | ICD-10-CM

## 2018-02-21 DIAGNOSIS — J189 Pneumonia, unspecified organism: Secondary | ICD-10-CM | POA: Diagnosis not present

## 2018-02-21 DIAGNOSIS — J9811 Atelectasis: Secondary | ICD-10-CM | POA: Diagnosis not present

## 2018-02-21 DIAGNOSIS — R0600 Dyspnea, unspecified: Secondary | ICD-10-CM

## 2018-03-03 DIAGNOSIS — M1712 Unilateral primary osteoarthritis, left knee: Secondary | ICD-10-CM | POA: Diagnosis not present

## 2018-03-22 DIAGNOSIS — H33012 Retinal detachment with single break, left eye: Secondary | ICD-10-CM | POA: Diagnosis not present

## 2018-03-22 DIAGNOSIS — H43812 Vitreous degeneration, left eye: Secondary | ICD-10-CM | POA: Diagnosis not present

## 2018-03-30 ENCOUNTER — Other Ambulatory Visit: Payer: Self-pay | Admitting: Neurology

## 2018-03-30 DIAGNOSIS — I6523 Occlusion and stenosis of bilateral carotid arteries: Secondary | ICD-10-CM

## 2018-04-13 DIAGNOSIS — C44529 Squamous cell carcinoma of skin of other part of trunk: Secondary | ICD-10-CM | POA: Diagnosis not present

## 2018-04-13 DIAGNOSIS — Z85828 Personal history of other malignant neoplasm of skin: Secondary | ICD-10-CM | POA: Diagnosis not present

## 2018-04-13 DIAGNOSIS — L819 Disorder of pigmentation, unspecified: Secondary | ICD-10-CM | POA: Diagnosis not present

## 2018-04-13 DIAGNOSIS — D229 Melanocytic nevi, unspecified: Secondary | ICD-10-CM | POA: Diagnosis not present

## 2018-04-13 DIAGNOSIS — L570 Actinic keratosis: Secondary | ICD-10-CM | POA: Diagnosis not present

## 2018-04-13 DIAGNOSIS — D485 Neoplasm of uncertain behavior of skin: Secondary | ICD-10-CM | POA: Diagnosis not present

## 2018-04-13 DIAGNOSIS — C4442 Squamous cell carcinoma of skin of scalp and neck: Secondary | ICD-10-CM | POA: Diagnosis not present

## 2018-04-13 DIAGNOSIS — L821 Other seborrheic keratosis: Secondary | ICD-10-CM | POA: Diagnosis not present

## 2018-04-13 DIAGNOSIS — D1801 Hemangioma of skin and subcutaneous tissue: Secondary | ICD-10-CM | POA: Diagnosis not present

## 2018-04-13 DIAGNOSIS — L814 Other melanin hyperpigmentation: Secondary | ICD-10-CM | POA: Diagnosis not present

## 2018-04-15 ENCOUNTER — Telehealth: Payer: Self-pay | Admitting: *Deleted

## 2018-04-15 NOTE — Telephone Encounter (Signed)
Telephone call returned to patient. She is starting to feel exactly how she felt last time she came out of remission. She states she has no energy. She has been unable to take a shower and proceed with her day without taking an hour rest first. Increased shortness of breath, minor abdominal swelling and blurry vision. She has followed up with her eye doctor and nothing abnormal was found.   Patient is requesting labs and a follow up visit prior to November. Please advise.

## 2018-04-19 ENCOUNTER — Other Ambulatory Visit: Payer: Self-pay | Admitting: Oncology

## 2018-04-19 DIAGNOSIS — C880 Waldenstrom macroglobulinemia: Secondary | ICD-10-CM

## 2018-04-20 ENCOUNTER — Inpatient Hospital Stay: Payer: Medicare Other | Attending: Oncology

## 2018-04-20 DIAGNOSIS — C880 Waldenstrom macroglobulinemia: Secondary | ICD-10-CM | POA: Diagnosis not present

## 2018-04-20 LAB — CMP (CANCER CENTER ONLY)
ALT: 11 U/L (ref 0–44)
AST: 16 U/L (ref 15–41)
Albumin: 3.9 g/dL (ref 3.5–5.0)
Alkaline Phosphatase: 70 U/L (ref 38–126)
Anion gap: 10 (ref 5–15)
BUN: 18 mg/dL (ref 8–23)
CO2: 30 mmol/L (ref 22–32)
Calcium: 10 mg/dL (ref 8.9–10.3)
Chloride: 107 mmol/L (ref 98–111)
Creatinine: 1.28 mg/dL — ABNORMAL HIGH (ref 0.44–1.00)
GFR, Est AFR Am: 43 mL/min — ABNORMAL LOW (ref >60)
GFR, Estimated: 37 mL/min — ABNORMAL LOW (ref >60)
Glucose, Bld: 93 mg/dL (ref 70–99)
Potassium: 3.9 mmol/L (ref 3.5–5.1)
Sodium: 147 mmol/L — ABNORMAL HIGH (ref 135–145)
Total Bilirubin: 0.9 mg/dL (ref 0.3–1.2)
Total Protein: 7.6 g/dL (ref 6.5–8.1)

## 2018-04-20 LAB — CBC WITH DIFFERENTIAL (CANCER CENTER ONLY)
Basophils Absolute: 0 10*3/uL (ref 0.0–0.1)
Basophils Relative: 1 %
Eosinophils Absolute: 0.1 10*3/uL (ref 0.0–0.5)
Eosinophils Relative: 3 %
HCT: 35.4 % (ref 34.8–46.6)
Hemoglobin: 12 g/dL (ref 11.6–15.9)
Lymphocytes Relative: 24 %
Lymphs Abs: 0.9 10*3/uL (ref 0.9–3.3)
MCH: 28.6 pg (ref 25.1–34.0)
MCHC: 34 g/dL (ref 31.5–36.0)
MCV: 84.4 fL (ref 79.5–101.0)
Monocytes Absolute: 0.2 10*3/uL (ref 0.1–0.9)
Monocytes Relative: 6 %
Neutro Abs: 2.5 10*3/uL (ref 1.5–6.5)
Neutrophils Relative %: 66 %
Platelet Count: 310 10*3/uL (ref 145–400)
RBC: 4.2 MIL/uL (ref 3.70–5.45)
RDW: 15.8 % — ABNORMAL HIGH (ref 11.2–14.5)
WBC Count: 3.7 10*3/uL — ABNORMAL LOW (ref 3.9–10.3)

## 2018-04-21 ENCOUNTER — Telehealth: Payer: Self-pay | Admitting: Emergency Medicine

## 2018-04-21 ENCOUNTER — Telehealth: Payer: Self-pay | Admitting: Oncology

## 2018-04-21 LAB — PROTEIN ELECTROPHORESIS, SERUM
A/G Ratio: 1.1 (ref 0.7–1.7)
Albumin ELP: 3.7 g/dL (ref 2.9–4.4)
Alpha-1-Globulin: 0.3 g/dL (ref 0.0–0.4)
Alpha-2-Globulin: 0.8 g/dL (ref 0.4–1.0)
Beta Globulin: 0.9 g/dL (ref 0.7–1.3)
Gamma Globulin: 1.5 g/dL (ref 0.4–1.8)
Globulin, Total: 3.4 g/dL (ref 2.2–3.9)
M-Spike, %: 1 g/dL — ABNORMAL HIGH
Total Protein ELP: 7.1 g/dL (ref 6.0–8.5)

## 2018-04-21 LAB — IGM: IgM (Immunoglobulin M), Srm: 1628 mg/dL — ABNORMAL HIGH (ref 26–217)

## 2018-04-21 NOTE — Telephone Encounter (Signed)
-----   Message from Ladell Pier, MD sent at 04/21/2018  7:27 AM EDT ----- Please call patient, [name] stable

## 2018-04-21 NOTE — Telephone Encounter (Signed)
Appt scheduled and called patient with date/time.  She also has medical concerns.  I transferred her to [facility]/ per 9/19 sch msg

## 2018-04-21 NOTE — Telephone Encounter (Addendum)
Pt verbalized understanding of this. States she still feels fatigued. Will inform lisa,NP and follow up w/ patient.   ----- Message from Ladell Pier, MD sent at 04/20/2018  2:03 PM EDT ----- Please call patient, hemoglobin looks good, waiting on [name] level, schedule a follow-up visit for week of 05/09/2018

## 2018-04-30 ENCOUNTER — Other Ambulatory Visit: Payer: Self-pay | Admitting: Neurology

## 2018-05-02 ENCOUNTER — Ambulatory Visit (HOSPITAL_COMMUNITY)
Admission: RE | Admit: 2018-05-02 | Discharge: 2018-05-02 | Disposition: A | Discharge status: Home / Self Care | Payer: Medicare Other | Source: Ambulatory Visit | Attending: Cardiovascular Disease | Admitting: Cardiovascular Disease

## 2018-05-02 DIAGNOSIS — I6523 Occlusion and stenosis of bilateral carotid arteries: Secondary | ICD-10-CM | POA: Insufficient documentation

## 2018-05-03 ENCOUNTER — Telehealth: Payer: Self-pay | Admitting: *Deleted

## 2018-05-03 NOTE — Telephone Encounter (Signed)
-----   Message from [place], DO sent at 05/03/2018  8:14 AM EDT ----- Let pt know that carotid u/s looked okay

## 2018-05-03 NOTE — Telephone Encounter (Signed)
Left message giving patient results.  

## 2018-05-09 ENCOUNTER — Telehealth: Payer: Self-pay

## 2018-05-09 ENCOUNTER — Inpatient Hospital Stay: Payer: Medicare Other | Attending: Oncology | Admitting: Oncology

## 2018-05-09 VITALS — BP 150/70

## 2018-05-09 DIAGNOSIS — Z23 Encounter for immunization: Secondary | ICD-10-CM | POA: Diagnosis not present

## 2018-05-09 DIAGNOSIS — R0609 Other forms of dyspnea: Secondary | ICD-10-CM | POA: Insufficient documentation

## 2018-05-09 DIAGNOSIS — R5381 Other malaise: Secondary | ICD-10-CM | POA: Insufficient documentation

## 2018-05-09 DIAGNOSIS — C880 Waldenstrom macroglobulinemia: Secondary | ICD-10-CM | POA: Diagnosis not present

## 2018-05-09 MED ORDER — INFLUENZA VAC SPLIT QUAD 0.5 ML IM SUSY
0.5000 mL | PREFILLED_SYRINGE | Freq: Once | INTRAMUSCULAR | Status: AC
  Administered 2018-05-09: 0.5 mL via INTRAMUSCULAR

## 2018-05-09 MED ORDER — INFLUENZA VAC SPLIT QUAD 0.5 ML IM SUSY
PREFILLED_SYRINGE | INTRAMUSCULAR | Status: AC
  Filled 2018-05-09: qty 0.5

## 2018-05-09 NOTE — Progress Notes (Signed)
  [facility] OFFICE PROGRESS NOTE   Diagnosis: WaldenStrom's macroglobulinemia  INTERVAL HISTORY:   Natasha Ellis returns prior to a scheduled visit.  She complains of malaise.  She fatigues easily.  She reports difficulty ambulating due to fatigue.  No fever or night sweats.  No palpable lymph nodes.  No recent infection.  She saw Dr. Laurann Montana and reports his evaluation was unremarkable.  Objective:  Vital signs in last 24 hours:  Blood pressure (!) 150/70.    HEENT: Neck without mass Lymphatics: No cervical, supraclavicular, or axillary nodes Resp: Lungs clear bilaterally Cardio: Regular rate and rhythm GI: No hepatosplenomegaly, no mass, nontender Vascular: Trace lower leg edema bilaterally   Lab Results:  Lab Results  Component Value Date   WBC 3.7 (L) 04/20/2018   HGB 12.0 04/20/2018   HCT 35.4 04/20/2018   MCV 84.4 04/20/2018   PLT 310 04/20/2018   NEUTROABS 2.5 04/20/2018    CMP  Lab Results  Component Value Date   NA 147 (H) 04/20/2018   K 3.9 04/20/2018   CL 107 04/20/2018   CO2 30 04/20/2018   GLUCOSE 93 04/20/2018   BUN 18 04/20/2018   CREATININE 1.28 (H) 04/20/2018   CALCIUM 10.0 04/20/2018   PROT 7.6 04/20/2018   ALBUMIN 3.9 04/20/2018   AST 16 04/20/2018   ALT 11 04/20/2018   ALKPHOS 70 04/20/2018   BILITOT 0.9 04/20/2018   GFRNONAA 37 (L) 04/20/2018   GFRAA 43 (L) 04/20/2018    [name] 1628, serum M spike 1.0 Medications: I have reviewed the patient's current medications.   Assessment/Plan: 1. 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 Treated with multiple systemic therapies includingpentostatin/Cytoxan/rituximab in 2010.   Cycle 1 bendamustine/Rituxan 05/31/2014.  Cycle 2 bendamustine/Rituxan 07/02/2014  Cycle 3 bendamustine/rituximab  07/30/2014  Cycle 4 bendamustine/rituximab 09/10/2014  Cycle 5 bendamustine/rituximab 10/22/2014  Cycle 6 bendamustine/rituximab 12/10/2014 2. Left knee arthritis. Followed by Dr. Wynelle Link. 3. Sleep apnea. 4. Anemia. microcytic with peripheral blood smear findings consistent with iron deficiency 07/09/2015, serum iron studies consistent with "anemia of chronic disease ", stool Hemoccult cards negative 07/11/2015 5. Question left parotitis 12/12/2013 presenting with left facial and parotid edema. 6. Admission with pneumonia February 2016 7. History of Neutropenia secondary to chemotherapy, now with moderate neutropenia 8. Left lung pneumonia 04/02/2015, sputum culture positive for Moraxella 06/19/2015 9. Low IgG and IgA 10. CT chest 05/13/2015 with bilateral lower lung consolidation 11. CT chest, abdomen, and pelvis 01/08/2016-new peribronchial thickening and peripheral tree in bud pattern at the right lower lobe, no lymphadenopathy 12. Sinus mucosal thickening/opacification on CT 08/16/2015-status post sinus surgery 12/05/2015 13. Sputum culture positive for pseudomonas aeruginosa on 01/10/2016-treated with 10 days of ciprofloxacin 14. 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 Admission with pneumonia 08/26/2016    Disposition: Ms. Benham is stable from a hematologic standpoint.  There is no laboratory evidence for progression of the low-grade lymphoma.  Her symptoms do not suggest lymphoma progression.  The etiology of her malaise and exertional dyspnea is unclear.  She will return for an office and lab visit in 2 months.  She will follow-up with pulmonary medicine or Dr. Laurann Montana in the interim as needed.  Ms. Rilling received an influenza vaccine today.  [name], MD  05/09/2018  1:45 PM

## 2018-05-09 NOTE — Telephone Encounter (Signed)
Printed avs and calender of upcoming appointment. Per 10/7 los 

## 2018-05-16 ENCOUNTER — Inpatient Hospital Stay: Payer: Medicare Other | Admitting: Oncology

## 2018-05-16 ENCOUNTER — Telehealth: Payer: Self-pay | Admitting: *Deleted

## 2018-05-16 NOTE — Telephone Encounter (Signed)
Telephone call to patient- she does not need office visit today. This was an error in scheduling. Appointment has been cancelled per Dr. Benay Spice.

## 2018-06-06 ENCOUNTER — Ambulatory Visit (INDEPENDENT_AMBULATORY_CARE_PROVIDER_SITE_OTHER): Payer: Medicare Other | Admitting: Neurology

## 2018-06-06 ENCOUNTER — Encounter: Payer: Self-pay | Admitting: Neurology

## 2018-06-06 VITALS — BP 120/80 | HR 68 | Ht 65.75 in | Wt 287.4 lb

## 2018-06-06 DIAGNOSIS — I6522 Occlusion and stenosis of left carotid artery: Secondary | ICD-10-CM

## 2018-06-06 DIAGNOSIS — G629 Polyneuropathy, unspecified: Secondary | ICD-10-CM | POA: Diagnosis not present

## 2018-06-06 DIAGNOSIS — M79604 Pain in right leg: Secondary | ICD-10-CM | POA: Diagnosis not present

## 2018-06-06 DIAGNOSIS — M79605 Pain in left leg: Secondary | ICD-10-CM | POA: Diagnosis not present

## 2018-06-06 DIAGNOSIS — I6389 Other cerebral infarction: Secondary | ICD-10-CM | POA: Diagnosis not present

## 2018-06-06 NOTE — Progress Notes (Signed)
Follow-up Visit   Date: 06/06/18    Natasha Ellis MRN: [id number] DOB: 05-21-1931   Interim History: EVANGELENE VORA is a 84 y.o.  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  The patient was accompanied to the clinic by daughter who also provides collateral information.    History of present illness: Around the end of May 2017, she was talking on the phone and suddenly, the phone slipped out of her hand.  She reached to pick it up, but again, she was unable to hold it and it slipped.  She did not have strong grip with her left hand, so ultimately used her right hand to finish her conversation.  Since this time, she has felt that her left hand is weaker and feels "heavy". She has been able to carry a jug of milk, but has difficulty with fine motor movements such as picking up her pill. Her weakness has not progressed since onset, but there has been no improvement.  Occasionally, she feels that the left side of her face droops and she drools on this side.  She does not have neck pain or leg weakness.  She has been walking with a cane since 2011 after knee surgery.  MRI brain confirmed R centrum semiovale stroke. Stroke work-up showed L ICA stenosis.    She also has involuntary cramping of the left 3-5th digits, about 3-5 minutes and has to manually try to massage and rub her hand.  She reports having two TIAs in 2014 manifesting with difficulty speaking and altered mental status while being treated with narcotics and valium for low back pain. MRI brain did not show acute stroke, there was chronic R MCA ischemic changes. MRA head was within normal limits.  She has been on plavix 75mg  daily since this time.   UPDATE 12/09/2017:  She  scheduled sooner visit due bilateral leg pain.  Over the past 6 months, she has achy and sore bilateral lower leg pain which is worse with exertion and present at rest.  If she does any walking or water exercises, her legs are especially sore 1-2 days later.  She has been evaluated by Dr. Maureen Ralphs for bilateral leg pain and was told she had knee arthritis and left knee ligament injury. She has some relief with tylenol and NSAIDs.  She does not have leg weakness but is having greater difficulty walking and getting up from a low chair because of reduced endurance and shortness of breath.  She continues to walk with a walker.  In 1961, she was 84 years old and had a fever which she treated with tylenol. She was eventually diagnosed with subclinical polio and is worried that she may have post-polio syndrome.     UPDATE 06/06/2018:  She continues to have leg soreness and stiffness.  She completed physical therapy without any benefit.  She does not have burning, stabbing, or numbness of the pain. She does not a lot of swelling in the legs and wears compression stockings.  Her daughter feels that she is very sedentary, which does not help her pain.  On Friday night, she woke up with electrical pain in the head, which lasted only a few seconds.  She was concerned it was a stroke.  She did not have any other neurological symptoms.    Medications:  Current Outpatient Medications on File Prior to Visit  Medication Sig Dispense Refill  . acetaminophen (TYLENOL) 500 MG tablet Take 1,000 mg by mouth every 6 (six) hours as needed for pain.     Marland Kitchen albuterol (PROAIR HFA) 108 (90 Base) MCG/ACT inhaler Inhale 2 puffs into the lungs every 6 (six) hours as needed for wheezing or shortness of breath. 1 Inhaler 3  . albuterol (PROVENTIL) (2.5 MG/3ML) 0.083% nebulizer solution Take 3 mLs (2.5 mg total) by nebulization every 4 (four) hours as needed for wheezing or shortness of breath. 75 mL 1  . alendronate (FOSAMAX) 70 MG tablet  Take 70 mg by mouth once a week. Sundays    . calcium gluconate 500 MG tablet Take 1 tablet by mouth daily.    . cholestyramine light (PREVALITE) 4 g packet Take 1 packet by mouth as needed.    . clopidogrel (PLAVIX) 75 MG tablet TAKE 1 TABLET BY MOUTH  DAILY 90 tablet 3  . ferrous sulfate 325 (65 FE) MG tablet Take 325 mg by mouth daily.     . fluticasone (FLONASE) 50 MCG/ACT nasal spray Place 1 spray into both nostrils daily. 1 g 0  . furosemide (LASIX) 40 MG tablet Take 20 mg by mouth as needed. May take 40 mg as needed.    . potassium chloride (K-DUR,KLOR-CON) 10 MEQ tablet     . sodium chloride (OCEAN) 0.65 % SOLN nasal spray Place 1 spray into both nostrils 3 (three) times daily as needed for congestion.     No current facility-administered medications on file prior to visit.     Allergies:  Allergies  Allergen Reactions  . Codeine Other (See Comments)    Crazy-nightmares  . Amoxicillin Diarrhea    Has patient had a PCN reaction causing immediate rash, facial/tongue/throat swelling, SOB or lightheadedness with hypotension:  No Has patient had a PCN reaction causing severe rash involving mucus membranes or skin necrosis: No Has patient had a PCN reaction that required hospitalization No Has patient had a PCN reaction occurring within the last 10 years: Unknown--rx is diarrhea. If all of the above answers are "NO", then may proceed with Cephalosporin use.    . Cefdinir Cough  . Doxycycline Diarrhea  . Erythromycin Other (See Comments)    GI upset  . Lisinopril Cough  . Lisinopril     unknown  . Oxybutynin Other (See Comments)    Dry mouth  . Sulfa Antibiotics Other (See Comments)    GI upset  . Sulfacetamide Sodium Other (See Comments)    GI upset    Review of Systems:  CONSTITUTIONAL: No fevers, chills, night sweats, or weight loss.  EYES: No visual changes or eye pain + blurry vision ENT: No hearing changes.  No history of nose bleeds.   RESPIRATORY: No cough,  wheezing and shortness of breath.   CARDIOVASCULAR: Negative for chest pain, and palpitations.   GI: Negative for abdominal discomfort, blood in stools or black stools.  No recent change in bowel habits.   GU:  No history of incontinence.   MUSCLOSKELETAL: No history of joint pain or swelling.  No myalgias.   SKIN: Negative for lesions, rash, and itching.   ENDOCRINE: Negative for cold or heat intolerance, polydipsia or goiter.   PSYCH:  No depression or anxiety symptoms.   NEURO: As Above.   Vital Signs:  BP 120/80   Pulse 68   Ht 5' 5.75" (1.67 m)   Wt 287 lb 6 oz (130.4  kg)   SpO2 96%   BMI 46.74 kg/m   General Medical Exam:   General:  Well appearing, comfortable  Eyes/ENT: see cranial nerve examination.   Neck:  No carotid bruits. Respiratory:  Clear to auscultation, good air entry bilaterally.   Cardiac:  Regular rate and rhythm, no murmur.   Ext:  Marked edema of the legs, she is wearing compression stockings  Neurological Exam: MENTAL STATUS including orientation to time, place, person, recent and remote memory, attention span and concentration, language, and fund of knowledge is normal.  Speech is not dysarthric.  CRANIAL NERVES:  Pupils equal round and reactive to light.  Normal conjugate, extra-ocular eye movements in all directions of gaze.  Mild left ptosis. Face is symmetric. Palate elevates symmetrically.  Tongue is midline.  MOTOR:  No atrophy, fasciculations or abnormal movements.  No pronator drift.  Tone is normal.    Right Upper Extremity:    Left Upper Extremity:    Deltoid  5/5   Deltoid  5/5   Biceps  5/5   Biceps  5/5   Triceps  5/5   Triceps  5/5   Wrist extensors  5/5   Wrist extensors  5/5   Wrist flexors  5/5   Wrist flexors  5/5   Finger extensors  5/5   Finger extensors  5-/5   Finger flexors  5/5   Finger flexors  5-/5   Dorsal interossei  5/5   Dorsal interossei  4+/5   Abductor pollicis  5/5   Abductor pollicis  4+/5   Tone (Ashworth  scale)  0  Tone (Ashworth scale)  0   Right Lower Extremity:    Left Lower Extremity:    Hip flexors  5/5   Hip flexors  5/5   Hip extensors  5/5   Hip extensors  5/5   Knee flexors  5/5   Knee flexors  5/5   Knee extensors  5/5   Knee extensors  5/5   Dorsiflexors  5/5   Dorsiflexors  5/5   Plantarflexors  5/5   Plantarflexors  5/5   Toe extensors  5/5   Toe extensors  5/5   Toe flexors  5/5   Toe flexors  5/5   Tone (Ashworth scale)  0  Tone (Ashworth scale)  0    MSRs:  Reflexes are 2+/4 in the upper extremities, except absent in the legs.  COORDINATION/GAIT: Gait is wide-based, slow and stable assisted with rollator, antalgic.   Data:  MRI lumbar spine wo contrast 04/28/2013: 1. Transitional anatomy, with numbering system on this study confirmed by Previous CT chest and CT abdomen and pelvis. Correlation with radiographs is recommended prior to any operative intervention. 2. Mild grade 1 spondylolisthesis at L4-L5 with disc and facet degeneration resulting in moderate spinal stenosis and mild to moderate right greater than left foraminal stenosis. 3. Multifactorial mild to moderate L3-L4 spinal and right greater than left foraminal stenosis.  US carotids 1/65/7903:  RICA 8-33%, LICA 38-32% stenosis  US carotids 04/22/1659:  RICA 6-00%, LICA upper end of 45-99* - progressed US carotids 05/02/2018:  Bilateral ICA 1-39%   MRI/A brain 04/03/2016: Acute or subacute punctate deep white matter infarcts, RIGHT centrum semiovale. Widespread areas of chronic ischemia in the RIGHT hemisphere, RIGHT MCA territory. Atrophy and small vessel disease. No proximal large vessel occlusion or flow-limiting stenosis.  Intracranial atherosclerotic change affects both distal MCA segments.  Lab Results  Component Value Date   CHOL 123 05/05/2017  HDL 38.80 (L) 05/05/2017   LDLCALC 68 05/05/2017   TRIG 83.0 05/05/2017   CHOLHDL 3 05/05/2017    IMPRESSION/PLAN: 1.  Bilateral achy leg pain.  We  discussed these complaints at her last visit and again reiterated that the nature of her pain does not favor nerve pathology. She does not have signs of spinal stenosis, lumbar radiculopathy, or worsening neuropathy.  No neurological testing is indicated.  She had marked edema of the legs, possible lymphedema could be causing her soreness.   2.  Episodic sharp head pain lasting a few seconds is not suggestive of a stroke.  Reassurance provided. If this persists, CT head will be the next step.  3.  Cryptogenic right centrum semiovale stroke (03/2016) with residual left hand weakness.  Stroke work-up with TTE and 30-day cardiac monitor is normal.  Continue secondary stroke prevention with plavix 75mg  daily.  She did not tolerate Lipitor (diarrhea) or zetia 10mg  daily.  Request lipid panel panel from Dr. Delene Ruffini office  4.   Bilateral ICA stenosis 1-39%, improved from 2018.  Recheck in 1 year.  5.  Idiopathic peripheral neuropathy of the legs, stable and without painful paresthesia.   Return to clinic in 9 months  Greater than 50% of this 30 minute visit was spent in counseling, explanation of diagnosis, planning of further management, and coordination of care due to her many questions/concerns.    Thank you for allowing me to participate in patient's care.  If I can answer any additional questions, I would be pleased to do so.    Sincerely,    Hearl Heikes K. Posey Pronto, DO

## 2018-06-06 NOTE — Patient Instructions (Signed)
Keep up with your home exercises  Return to clinic in 9 months

## 2018-06-07 ENCOUNTER — Other Ambulatory Visit: Payer: Medicare Other

## 2018-06-07 ENCOUNTER — Ambulatory Visit: Payer: Medicare Other | Admitting: Oncology

## 2018-06-13 DIAGNOSIS — D485 Neoplasm of uncertain behavior of skin: Secondary | ICD-10-CM | POA: Diagnosis not present

## 2018-06-13 DIAGNOSIS — L905 Scar conditions and fibrosis of skin: Secondary | ICD-10-CM | POA: Diagnosis not present

## 2018-06-13 DIAGNOSIS — C44622 Squamous cell carcinoma of skin of right upper limb, including shoulder: Secondary | ICD-10-CM | POA: Diagnosis not present

## 2018-06-13 DIAGNOSIS — C44311 Basal cell carcinoma of skin of nose: Secondary | ICD-10-CM | POA: Diagnosis not present

## 2018-06-13 DIAGNOSIS — Z85828 Personal history of other malignant neoplasm of skin: Secondary | ICD-10-CM | POA: Diagnosis not present

## 2018-06-14 DIAGNOSIS — G4733 Obstructive sleep apnea (adult) (pediatric): Secondary | ICD-10-CM | POA: Diagnosis not present

## 2018-06-14 DIAGNOSIS — R0609 Other forms of dyspnea: Secondary | ICD-10-CM | POA: Diagnosis not present

## 2018-06-14 DIAGNOSIS — R609 Edema, unspecified: Secondary | ICD-10-CM | POA: Diagnosis not present

## 2018-06-14 DIAGNOSIS — Z79899 Other long term (current) drug therapy: Secondary | ICD-10-CM | POA: Diagnosis not present

## 2018-06-14 DIAGNOSIS — R05 Cough: Secondary | ICD-10-CM | POA: Diagnosis not present

## 2018-06-14 DIAGNOSIS — I6522 Occlusion and stenosis of left carotid artery: Secondary | ICD-10-CM | POA: Diagnosis not present

## 2018-06-17 ENCOUNTER — Telehealth: Payer: Self-pay | Admitting: Neurology

## 2018-06-17 NOTE — Telephone Encounter (Signed)
Labs 06/14/2018 rec'd from [facility]:  Cr 1.08, GFR 48*, Na 143, K 4.8, Ca 10, Chol 160, TG 130, HDL 37, LDL97, TSH 2.39  Labs are stable, bad cholesterol is trending up but still within normal limits - please recommend that she stay active.     Jakyiah Briones K. Posey Pronto, DO

## 2018-06-20 NOTE — Telephone Encounter (Signed)
Left message giving patient results and instructions.   

## 2018-07-18 ENCOUNTER — Inpatient Hospital Stay: Payer: Medicare Other

## 2018-07-18 ENCOUNTER — Other Ambulatory Visit: Payer: Medicare Other

## 2018-07-18 ENCOUNTER — Telehealth: Payer: Self-pay

## 2018-07-18 ENCOUNTER — Inpatient Hospital Stay: Payer: Medicare Other | Attending: Oncology | Admitting: Oncology

## 2018-07-18 VITALS — BP 120/82 | HR 70 | Temp 97.6°F | Resp 20 | Ht 65.75 in | Wt 284.3 lb

## 2018-07-18 DIAGNOSIS — C880 Waldenstrom macroglobulinemia: Secondary | ICD-10-CM

## 2018-07-18 DIAGNOSIS — Z8673 Personal history of transient ischemic attack (TIA), and cerebral infarction without residual deficits: Secondary | ICD-10-CM | POA: Insufficient documentation

## 2018-07-18 DIAGNOSIS — Z8572 Personal history of non-Hodgkin lymphomas: Secondary | ICD-10-CM | POA: Diagnosis not present

## 2018-07-18 LAB — CBC WITH DIFFERENTIAL (CANCER CENTER ONLY)
Abs Immature Granulocytes: 0.01 10*3/uL (ref 0.00–0.07)
Basophils Absolute: 0 10*3/uL (ref 0.0–0.1)
Basophils Relative: 1 %
Eosinophils Absolute: 0.2 10*3/uL (ref 0.0–0.5)
Eosinophils Relative: 3 %
HCT: 38.9 % (ref 36.0–46.0)
Hemoglobin: 12.3 g/dL (ref 12.0–15.0)
Immature Granulocytes: 0 %
Lymphocytes Relative: 31 %
Lymphs Abs: 1.7 10*3/uL (ref 0.7–4.0)
MCH: 27.5 pg (ref 26.0–34.0)
MCHC: 31.6 g/dL (ref 30.0–36.0)
MCV: 86.8 fL (ref 80.0–100.0)
Monocytes Absolute: 0.4 10*3/uL (ref 0.1–1.0)
Monocytes Relative: 7 %
Neutro Abs: 3.2 10*3/uL (ref 1.7–7.7)
Neutrophils Relative %: 58 %
Platelet Count: 295 10*3/uL (ref 150–400)
RBC: 4.48 MIL/uL (ref 3.87–5.11)
RDW: 14.8 % (ref 11.5–15.5)
WBC Count: 5.5 10*3/uL (ref 4.0–10.5)
nRBC: 0 % (ref 0.0–0.2)

## 2018-07-18 LAB — CMP (CANCER CENTER ONLY)
ALT: 15 U/L (ref 0–44)
AST: 16 U/L (ref 15–41)
Albumin: 4 g/dL (ref 3.5–5.0)
Alkaline Phosphatase: 68 U/L (ref 38–126)
Anion gap: 10 (ref 5–15)
BUN: 17 mg/dL (ref 8–23)
CO2: 28 mmol/L (ref 22–32)
Calcium: 9.9 mg/dL (ref 8.9–10.3)
Chloride: 108 mmol/L (ref 98–111)
Creatinine: 1.19 mg/dL — ABNORMAL HIGH (ref 0.44–1.00)
GFR, Est AFR Am: 48 mL/min — ABNORMAL LOW (ref >60)
GFR, Estimated: 42 mL/min — ABNORMAL LOW (ref >60)
Glucose, Bld: 104 mg/dL — ABNORMAL HIGH (ref 70–99)
Potassium: 4.3 mmol/L (ref 3.5–5.1)
Sodium: 146 mmol/L — ABNORMAL HIGH (ref 135–145)
Total Bilirubin: 0.8 mg/dL (ref 0.3–1.2)
Total Protein: 7.5 g/dL (ref 6.5–8.1)

## 2018-07-18 NOTE — Progress Notes (Signed)
  [facility] OFFICE PROGRESS NOTE   Diagnosis: Waldenstrom's macroglobulinemia  INTERVAL HISTORY:   [name] returns as scheduled.  She reports improvement in dyspnea.  Good appetite.  She has started taking a "B "vitamin.  Objective:  Vital signs in last 24 hours:  Blood pressure 120/82, pulse 70, temperature 97.6 F (36.4 C), temperature source Oral, resp. rate 20, height 5' 5.75" (1.67 m), weight 284 lb 4.8 oz (129 kg), SpO2 99 %.    HEENT: Neck without mass Lymphatics: No cervical, supraclavicular, axillary, or inguinal nodes Resp: Lungs clear bilaterally, no respiratory distress Cardio: Regular rate and rhythm GI: No hepatosplenomegaly, no mass, nontender Vascular: No leg edema   Lab Results:  Lab Results  Component Value Date   WBC 5.5 07/18/2018   HGB 12.3 07/18/2018   HCT 38.9 07/18/2018   MCV 86.8 07/18/2018   PLT 295 07/18/2018   NEUTROABS 3.2 07/18/2018    CMP  Lab Results  Component Value Date   NA 146 (H) 07/18/2018   K 4.3 07/18/2018   CL 108 07/18/2018   CO2 28 07/18/2018   GLUCOSE 104 (H) 07/18/2018   BUN 17 07/18/2018   CREATININE 1.19 (H) 07/18/2018   CALCIUM 9.9 07/18/2018   PROT 7.5 07/18/2018   ALBUMIN 4.0 07/18/2018   AST 16 07/18/2018   ALT 15 07/18/2018   ALKPHOS 68 07/18/2018   BILITOT 0.8 07/18/2018   GFRNONAA 42 (L) 07/18/2018   GFRAA 48 (L) 07/18/2018   04/20/2018-[name] 1628, serum M spike 1.0 Medications: I have reviewed the patient's current medications.   Assessment/Plan: 1. 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 Treated with multiple systemic therapies includingpentostatin/Cytoxan/rituximab in 2010.   Cycle 1 bendamustine/Rituxan 05/31/2014.  Cycle 2 bendamustine/Rituxan 07/02/2014  Cycle 3 bendamustine/rituximab  07/30/2014  Cycle 4 bendamustine/rituximab 09/10/2014  Cycle 5 bendamustine/rituximab 10/22/2014  Cycle 6 bendamustine/rituximab 12/10/2014 2. Left knee arthritis. Followed by Dr. Wynelle Link. 3. Sleep apnea. 4. Anemia. microcytic with peripheral blood smear findings consistent with iron deficiency 07/09/2015, serum iron studies consistent with "anemia of chronic disease ", stool Hemoccult cards negative 07/11/2015 5. Question left parotitis 12/12/2013 presenting with left facial and parotid edema. 6. Admission with pneumonia February 2016 7. History of Neutropenia secondary to chemotherapy, now with moderate neutropenia 8. Left lung pneumonia 04/02/2015, sputum culture positive for Moraxella 06/19/2015 9. Low IgG and IgA 10. CT chest 05/13/2015 with bilateral lower lung consolidation 11. CT chest, abdomen, and pelvis 01/08/2016-new peribronchial thickening and peripheral tree in bud pattern at the right lower lobe, no lymphadenopathy 12. Sinus mucosal thickening/opacification on CT 08/16/2015-status post sinus surgery 12/05/2015 13. Sputum culture positive for pseudomonas aeruginosa on 01/10/2016-treated with 10 days of ciprofloxacin 14. 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 Admission with pneumonia 08/26/2016      Disposition: Ms. Othman appears well.  There is no clinical evidence for progression of the low-grade lymphoma.  We will follow-up on the serum protein electrophoresis and [name] level from today.  She will return for an office and lab visit in 6 months.  She will contact us in the interim for new symptoms.  15 minutes were spent with the patient today.  The majority of the time was used for counseling and coordination of care.  [name], MD  07/18/2018  1:47 PM

## 2018-07-18 NOTE — Telephone Encounter (Signed)
Printed avs and calender of upcoming appointment. Per 12/16 los 

## 2018-07-19 LAB — PROTEIN ELECTROPHORESIS, SERUM
A/G Ratio: 1.2 (ref 0.7–1.7)
Albumin ELP: 3.7 g/dL (ref 2.9–4.4)
Alpha-1-Globulin: 0.3 g/dL (ref 0.0–0.4)
Alpha-2-Globulin: 0.7 g/dL (ref 0.4–1.0)
Beta Globulin: 0.8 g/dL (ref 0.7–1.3)
Gamma Globulin: 1.4 g/dL (ref 0.4–1.8)
Globulin, Total: 3.2 g/dL (ref 2.2–3.9)
M-Spike, %: 1.2 g/dL — ABNORMAL HIGH
Total Protein ELP: 6.9 g/dL (ref 6.0–8.5)

## 2018-07-19 LAB — IGM: IgM (Immunoglobulin M), Srm: 1529 mg/dL — ABNORMAL HIGH (ref 26–217)

## 2018-07-20 ENCOUNTER — Telehealth: Payer: Self-pay

## 2018-07-20 NOTE — Telephone Encounter (Signed)
TC to Pt. About lab results, Per Dr. Benay Spice, Pt informed of [name] level which is stable per Dr. Benay Spice, Pt received and verbalized understanding, No further problems or concerns noted.

## 2018-08-10 DIAGNOSIS — C44311 Basal cell carcinoma of skin of nose: Secondary | ICD-10-CM | POA: Diagnosis not present

## 2018-08-10 DIAGNOSIS — C44622 Squamous cell carcinoma of skin of right upper limb, including shoulder: Secondary | ICD-10-CM | POA: Diagnosis not present

## 2018-08-15 DIAGNOSIS — H9212 Otorrhea, left ear: Secondary | ICD-10-CM | POA: Diagnosis not present

## 2018-08-15 DIAGNOSIS — H72813 Multiple perforations of tympanic membrane, bilateral: Secondary | ICD-10-CM | POA: Diagnosis not present

## 2018-08-15 DIAGNOSIS — H906 Mixed conductive and sensorineural hearing loss, bilateral: Secondary | ICD-10-CM | POA: Diagnosis not present

## 2018-08-23 ENCOUNTER — Ambulatory Visit (INDEPENDENT_AMBULATORY_CARE_PROVIDER_SITE_OTHER)
Admission: RE | Admit: 2018-08-23 | Discharge: 2018-08-23 | Disposition: A | Discharge status: Home / Self Care | Payer: Medicare Other | Source: Ambulatory Visit | Attending: Pulmonary Disease | Admitting: Pulmonary Disease

## 2018-08-23 ENCOUNTER — Ambulatory Visit (INDEPENDENT_AMBULATORY_CARE_PROVIDER_SITE_OTHER): Payer: Medicare Other | Admitting: Pulmonary Disease

## 2018-08-23 ENCOUNTER — Encounter: Payer: Self-pay | Admitting: Pulmonary Disease

## 2018-08-23 VITALS — BP 132/74 | HR 75 | Ht 65.75 in | Wt 287.0 lb

## 2018-08-23 DIAGNOSIS — R0600 Dyspnea, unspecified: Secondary | ICD-10-CM

## 2018-08-23 DIAGNOSIS — R0609 Other forms of dyspnea: Secondary | ICD-10-CM | POA: Diagnosis not present

## 2018-08-23 DIAGNOSIS — G4733 Obstructive sleep apnea (adult) (pediatric): Secondary | ICD-10-CM

## 2018-08-23 DIAGNOSIS — R05 Cough: Secondary | ICD-10-CM

## 2018-08-23 DIAGNOSIS — R059 Cough, unspecified: Secondary | ICD-10-CM

## 2018-08-23 DIAGNOSIS — R918 Other nonspecific abnormal finding of lung field: Secondary | ICD-10-CM | POA: Diagnosis not present

## 2018-08-23 NOTE — Patient Instructions (Signed)
Take Lasix 40 mg daily for 2 weeks. Take potassium pill daily. Call me in 1 week to report if you are feeling better.  Chest x-ray today.  We will check CPAP report

## 2018-08-23 NOTE — Assessment & Plan Note (Signed)
She has gained 20 pounds and does have increasing pedal edema, so dyspnea may be most likely related to fluid overload although LV function was normal on her prior echo  Will diurese and see if she improves  Take Lasix 40 mg daily for 2 weeks. Take potassium pill daily. Call me in 1 week to report if you are feeling better.  Chest x-ray today.

## 2018-08-23 NOTE — Progress Notes (Signed)
Subjective:    Patient ID: Natasha Ellis, female    DOB: [birth year], 85 y.o.   MRN: [id number]  HPI  85 yo with hx of recurrent pneumonia, chronic cough & OSA -on CPAP 8 cm CHronic cough since 2005  - Low grade B cell diagnosed 2004. S/p chemo 2011 at [place]. Transformed into lymphoplasmacytic lymphoma with a high serum viscosity and [name] Kappa serum M spike. Treated with multiple systemic therapies, Last 12/2014   She has a 25 pack-year smoking history -Quit in 1981 Was using only nocutrnal oxygen--this was discontinued in sept 2013   Underwent sinus surgery 12/2015 [name])    Chief Complaint  Patient presents with  . Acute Visit    Increased wheezing for the past 2 months. Increased SOB.      She presents for an acute visit, increased shortness of breath for 2 months with upper airway wheezing.  She uses a walker to ambulate. She has gained 20 pounds from 265 to her current weight of 287 pounds. Chest x-ray 01/2018 was personally reviewed which does not show effusions may show left lower lobe atelectasis.  Her chronic cough that she is to see me for has resolved.  She is colonized with a pansensitive Pseudomonas based on prior testing  She reports using her duo nebs daily and has minimal sputum production after.  She denies chills or fevers.  She denies orthopnea.  She is very compliant with her CPAP machine, auto settings 8 to 15 cm, this is confirmed on download which shows excellent compliance, average pressure 12 cm and mild leak  Significant tests/ events reviewed  08/2015 CT sinus >>Extensive multifocal paranasal sinus disease, most marked in the ethmoid air cell complexes and in the left maxillary antrum  05/2015 CT chest showed bilateral lower lobe consolidation  10/2015 ONO shows minimal desaturations; does not need O2.  01/2016 pansensitive pseudomonas in sputum  PFTs 12/2012 No airway obstruction ratio 74, FEV1 84%, DLCO 56%        spirometry 12/2016   does not show any airway obstruction, mild restriction   Past Medical History:  Diagnosis Date  . Arthritis   . Asthma   . Cancer ([place])    Lymphoma  . Cataract   . Chronic cough   . Diverticulosis of intestine    H/O  . DJD (degenerative joint disease) of lumbar spine   . IBS (irritable bowel syndrome)   . Left knee DJD   . Malignant lymphoplasmacytic lymphoma ([place]) 2004   Non-Hodgkin Lymphoma  . Right knee DJD   . Sleep apnea     Review of Systems neg for any significant sore throat, dysphagia, itching, sneezing, nasal congestion or excess/ purulent secretions, fever, chills, sweats, unintended wt loss, pleuritic or exertional cp, hempoptysis, orthopnea pnd or change in chronic leg swelling. Also denies presyncope, palpitations, heartburn, abdominal pain, nausea, vomiting, diarrhea or change in bowel or urinary habits, dysuria,hematuria, rash, arthralgias, visual complaints, headache, numbness weakness or ataxia.     Objective:   Physical Exam  Gen. Pleasant, obese, in no distress, normal affect ENT - no pallor,icterus, no post nasal drip, class 2-3 airway Neck: No JVD, no thyromegaly, no carotid bruits Lungs: no use of accessory muscles, no dullness to percussion, left basal rales no rhonchi  Cardiovascular: Rhythm regular, heart sounds  normal, no murmurs or gallops, 2+  peripheral edema Abdomen: soft and non-tender, no hepatosplenomegaly, BS normal. Musculoskeletal: No deformities, no cyanosis or clubbing Neuro:  alert, non focal, no  tremors        Assessment & Plan:

## 2018-08-23 NOTE — Assessment & Plan Note (Signed)
CPAP download objectively confirms that she is very compliant.  CPAP is certainly helped improve her daytime somnolence and fatigue. Continue auto CPAP settings, average pressure is 12 cm.   Weight loss encouraged, compliance with goal of at least 4-6 hrs every night is the expectation. Advised against medications with sedative side effects Cautioned against driving when sleepy - understanding that sleepiness will vary on a day to day basis

## 2018-08-24 DIAGNOSIS — D485 Neoplasm of uncertain behavior of skin: Secondary | ICD-10-CM | POA: Diagnosis not present

## 2018-08-24 DIAGNOSIS — C44719 Basal cell carcinoma of skin of left lower limb, including hip: Secondary | ICD-10-CM | POA: Diagnosis not present

## 2018-08-24 DIAGNOSIS — C44629 Squamous cell carcinoma of skin of left upper limb, including shoulder: Secondary | ICD-10-CM | POA: Diagnosis not present

## 2018-08-26 ENCOUNTER — Other Ambulatory Visit: Payer: Self-pay

## 2018-08-26 DIAGNOSIS — R059 Cough, unspecified: Secondary | ICD-10-CM

## 2018-08-26 DIAGNOSIS — R05 Cough: Secondary | ICD-10-CM

## 2018-08-29 ENCOUNTER — Telehealth: Payer: Self-pay | Admitting: Pulmonary Disease

## 2018-08-29 NOTE — Telephone Encounter (Signed)
Called and spoke with Patient.  She stated that Dr. Elsworth Soho told her to call with update, after increasing Lasix to 40mg  daily.  She stated that she has not noticed a difference. She stated that her weight has not went down, she I still puffy, feet are still puffy, having problems getting her feet in her shoes, and SHOB with exertion.    Will route message to Dr. Elsworth Soho

## 2018-08-29 NOTE — Telephone Encounter (Signed)
LMTCB

## 2018-08-29 NOTE — Telephone Encounter (Signed)
Patient returning phone call.  Patient's phone number is [phone].

## 2018-08-29 NOTE — Telephone Encounter (Signed)
Spoke with patient. She is aware to increase her Lasix for 3 days. She has a CT scan scheduled for tomorrow. Nothing further needed at time of call.

## 2018-08-29 NOTE — Telephone Encounter (Signed)
Increase to 80 mg Lasix for 3 days and then report back Also await CT scan of the chest for alternative cause

## 2018-08-30 ENCOUNTER — Ambulatory Visit (INDEPENDENT_AMBULATORY_CARE_PROVIDER_SITE_OTHER)
Admission: RE | Admit: 2018-08-30 | Discharge: 2018-08-30 | Disposition: A | Discharge status: Home / Self Care | Payer: Medicare Other | Source: Ambulatory Visit | Attending: Pulmonary Disease | Admitting: Pulmonary Disease

## 2018-08-30 DIAGNOSIS — J479 Bronchiectasis, uncomplicated: Secondary | ICD-10-CM | POA: Diagnosis not present

## 2018-08-30 DIAGNOSIS — R911 Solitary pulmonary nodule: Secondary | ICD-10-CM | POA: Diagnosis not present

## 2018-08-30 DIAGNOSIS — R059 Cough, unspecified: Secondary | ICD-10-CM

## 2018-08-30 DIAGNOSIS — R05 Cough: Secondary | ICD-10-CM

## 2018-08-30 DIAGNOSIS — I719 Aortic aneurysm of unspecified site, without rupture: Secondary | ICD-10-CM | POA: Diagnosis not present

## 2018-08-31 ENCOUNTER — Telehealth: Payer: Self-pay | Admitting: Pulmonary Disease

## 2018-08-31 DIAGNOSIS — R0609 Other forms of dyspnea: Principal | ICD-10-CM

## 2018-08-31 DIAGNOSIS — R0600 Dyspnea, unspecified: Secondary | ICD-10-CM

## 2018-08-31 NOTE — Telephone Encounter (Signed)
She has a bronchiectasis in her lower lobes -dilated and very close airways which explains the crackles I heard on exam. This explains why it takes her longer time for her bronchitis symptoms to resolve A small nodule was also noted which appears to have increased in size from 2017 and we will need a follow-up CT chest without contrast in 4 to 6 months

## 2018-08-31 NOTE — Telephone Encounter (Signed)
Natasha Ellis with [facility] calling with a call report from pt's HRCT from 08/31/17.  Full report available in Epic.     IMPRESSION: 1. Negative for fibrotic interstitial lung disease. Mid and lower lung zone bronchiectasis, likely postinfectious in etiology. 2. 12 mm medial left lower lobe nodule, enlarged from 01/08/2016.  RA please advise.  Thanks!

## 2018-09-02 NOTE — Telephone Encounter (Signed)
Called and spoke with pt letting her know the results of the HRCT and stated to pt a f/u CT will need to be done 4-6 months. Pt expressed understanding.   While speaking to pt, pt stated she took the 80mg  lasix as stated and has lost about 5 pounds but states she is currently not feeling well and wants to know if she could take an extra potassium to see if that would help her begin to feel better. Pt wonders if the extra lasix has just wiped her out and that is why she is wondering if she could take an extra potassium.  Dr. Elsworth Soho, please advise on this for pt. Thanks!

## 2018-09-05 ENCOUNTER — Telehealth: Payer: Self-pay | Admitting: Pulmonary Disease

## 2018-09-05 NOTE — Telephone Encounter (Signed)
Can be done after oV

## 2018-09-05 NOTE — Addendum Note (Signed)
Addended by: Elton Sin on: 09/05/2018 01:19 PM   Modules accepted: Orders

## 2018-09-05 NOTE — Telephone Encounter (Signed)
We will check her potassium level on her visit 2/4 and advise

## 2018-09-05 NOTE — Telephone Encounter (Signed)
Please order BMET

## 2018-09-05 NOTE — Telephone Encounter (Signed)
BMET ordered.  Patient aware to have labs are ordered and she can have drawn after OV, 09/06/18, with Tammy P, NP.  Nothing further at this time.

## 2018-09-05 NOTE — Telephone Encounter (Signed)
Pt is aware of below message and voiced her understanding.  Lab for potassium has been ordered.  Nothing further is needed.

## 2018-09-06 ENCOUNTER — Ambulatory Visit (INDEPENDENT_AMBULATORY_CARE_PROVIDER_SITE_OTHER): Payer: Medicare Other | Admitting: Adult Health

## 2018-09-06 ENCOUNTER — Encounter: Payer: Self-pay | Admitting: Adult Health

## 2018-09-06 DIAGNOSIS — J479 Bronchiectasis, uncomplicated: Secondary | ICD-10-CM | POA: Diagnosis not present

## 2018-09-06 DIAGNOSIS — R0600 Dyspnea, unspecified: Secondary | ICD-10-CM

## 2018-09-06 DIAGNOSIS — R0609 Other forms of dyspnea: Secondary | ICD-10-CM | POA: Diagnosis not present

## 2018-09-06 DIAGNOSIS — G4733 Obstructive sleep apnea (adult) (pediatric): Secondary | ICD-10-CM | POA: Diagnosis not present

## 2018-09-06 DIAGNOSIS — R911 Solitary pulmonary nodule: Secondary | ICD-10-CM | POA: Insufficient documentation

## 2018-09-06 DIAGNOSIS — I7121 Aneurysm of the ascending aorta, without rupture: Secondary | ICD-10-CM

## 2018-09-06 DIAGNOSIS — I712 Thoracic aortic aneurysm, without rupture: Secondary | ICD-10-CM | POA: Diagnosis not present

## 2018-09-06 LAB — POTASSIUM: Potassium: 3.6 mEq/L (ref 3.5–5.1)

## 2018-09-06 LAB — BASIC METABOLIC PANEL
BUN: 18 mg/dL (ref 6–23)
CO2: 29 mEq/L (ref 19–32)
Calcium: 9.7 mg/dL (ref 8.4–10.5)
Chloride: 104 mEq/L (ref 96–112)
Creatinine, Ser: 1.19 mg/dL (ref 0.40–1.20)
GFR: 43.07 mL/min — ABNORMAL LOW (ref >60.00)
Glucose, Bld: 100 mg/dL — ABNORMAL HIGH (ref 70–99)
Potassium: 3.6 mEq/L (ref 3.5–5.1)
Sodium: 143 mEq/L (ref 135–145)

## 2018-09-06 MED ORDER — CEPHALEXIN 500 MG PO CAPS: 500.0000 mg | ORAL_CAPSULE | Freq: Three times a day (TID) | ORAL | 0 refills | Status: DC

## 2018-09-06 MED ORDER — FLUTTER DEVI: 0 refills | Status: DC

## 2018-09-06 NOTE — Assessment & Plan Note (Signed)
Doing well on CPAP  Cont on CPAP At bedtime

## 2018-09-06 NOTE — Patient Instructions (Signed)
Begin Keflex 500 mg 3 times daily, take with food for 1 week Eat yogurt daily Continue on Lasix 20 mg daily Keep legs elevated Wash lower extremity ulcer daily pat dry and cover with nonstick bandage Follow-up with dermatology this week .   Continue on CPAP at bedtime Work on healthy weight  Begin flutter valve 3 times daily Mucinex twice daily as needed for cough or congestion May use albuterol nebulizer as needed  We will call with lab results  Follow-up with Dr. Elsworth Soho or Nattie Lazenby NP in 3 months and As needed   Please contact office for sooner follow up if symptoms do not improve or worsen or seek emergency care

## 2018-09-06 NOTE — Assessment & Plan Note (Signed)
12 mm LLL nodule increased slightly in size from 2017  follow up CT chest no contrast in 3 months

## 2018-09-06 NOTE — Progress Notes (Signed)
@Patient  ID: Natasha Ellis, female    DOB: [birth year], 85 y.o.   MRN: [id number]  Chief Complaint  Patient presents with  . Follow-up    Bronchiectasis     Referring provider: Lavone Orn, MD  HPI: 85 year old female former smoker followed for recurrent pneumonia, chronic cough, obstructive sleep apnea on CPAP.  She is colonized with pansensitive Pseudomonas. 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  Treated with multiple systemic therapies last in 2016 Waldenstrom's macroglobulinemia Previously on nocturnal oxygen discontinued in September 2013 Chronic sinus disease status post sinus surgery 2017 [name])   TEST/EVENTS :  08/2015 CT sinus >>Extensive multifocal paranasal sinus disease, most marked in the ethmoid air cell complexes and in the left maxillary antrum  05/2015 CT chest showed bilateral lower lobe consolidation  10/2015 ONO shows minimal desaturations; does not need O2.  01/2016 pansensitive pseudomonas in sputum  PFTs 12/2012 No airway obstruction ratio 74, FEV1 84%, DLCO 56% spirometry 5/2018does not show any airway obstruction, mild restriction   09/06/2018 Follow up : Bronchitis  Pt returns for 1 week follow up . Patient was seen last visit for increased shortness of breath and intermittent wheezing she also had had increased weight gain with lower extremity swelling. She was instructed to increase her Lasix to 40 mg daily.  She was set up for a CT chest that showed mid to lower lung bronchiectasis, negative for ILD, 12 mm subpleural nodule left lower lobe.  She did not see a substantial decrease in leg swelling.  Her Lasix was increased to 80 mg.  Patient says she did lose 5 pounds but it started to make her feel more fatigued and weak.  So she went back to 20 mg daily.  She complains today that she is  somewhat weak.  And has low energy.  She denies any increased cough.  No wheezing.  Did have some low-grade fever overnight. She does have a basal cell skin cancer on her left lower leg.  She says that it has been draining more and increased tenderness.  She does have it scheduled for excision next month.   CT chest January 29 showed negative for ILD.  Mid to lower lung zone bronchiectasis 12 mm left lower lobe nodule, ascending aortic aneurysm    Allergies  Allergen Reactions  . Codeine Other (See Comments)    Crazy-nightmares  . Amoxicillin Diarrhea    Has patient had a PCN reaction causing immediate rash, facial/tongue/throat swelling, SOB or lightheadedness with hypotension:  No Has patient had a PCN reaction causing severe rash involving mucus membranes or skin necrosis: No Has patient had a PCN reaction that required hospitalization No Has patient had a PCN reaction occurring within the last 10 years: Unknown--rx is diarrhea. If all of the above answers are "NO", then may proceed with Cephalosporin use.    . Cefdinir Cough  . Doxycycline Diarrhea  . Erythromycin Other (See Comments)    GI upset  . Lisinopril Cough  . Lisinopril     unknown  . Oxybutynin Other (See Comments)    Dry mouth  . Sulfa Antibiotics Other (See Comments)    GI upset  . Sulfacetamide Sodium Other (See Comments)    GI upset    Immunization History  Administered Date(s) Administered  . Influenza Whole 05/09/2012  . Influenza, High Dose Seasonal PF 05/24/2017  . Influenza,inj,Quad  PF,6+ Mos 04/30/2013, 10/24/2015, 04/22/2016, 05/09/2018  . Influenza-Unspecified 05/03/2014  . Pneumococcal Polysaccharide-23 08/03/2009    Past Medical History:  Diagnosis Date  . Arthritis   . Asthma   . Cancer ([place])    Lymphoma  . Cataract   . Chronic cough   . Diverticulosis of intestine    H/O  . DJD (degenerative joint disease) of lumbar spine   . IBS (irritable bowel syndrome)   . Left knee DJD   .  Malignant lymphoplasmacytic lymphoma ([place]) 2004   Non-Hodgkin Lymphoma  . Right knee DJD   . Sleep apnea     Tobacco History: Social History   Tobacco Use  Smoking Status Former Smoker  . Packs/day: 1.00  . Years: 25.00  . Pack years: 25.00  . Types: Cigarettes  . Last attempt to quit: 08/04/1979  . Years since quitting: 39.1  Smokeless Tobacco Never Used   Counseling given: Not Answered   Outpatient Medications Prior to Visit  Medication Sig Dispense Refill  . acetaminophen (TYLENOL) 500 MG tablet Take 1,000 mg by mouth every 6 (six) hours as needed for pain.     Marland Kitchen albuterol (PROAIR HFA) 108 (90 Base) MCG/ACT inhaler Inhale 2 puffs into the lungs every 6 (six) hours as needed for wheezing or shortness of breath. 1 Inhaler 3  . albuterol (PROVENTIL) (2.5 MG/3ML) 0.083% nebulizer solution Take 3 mLs (2.5 mg total) by nebulization every 4 (four) hours as needed for wheezing or shortness of breath. 75 mL 1  . alendronate (FOSAMAX) 70 MG tablet Take 70 mg by mouth once a week. Sundays    . calcium gluconate 500 MG tablet Take 1 tablet by mouth daily.    . cholestyramine light (PREVALITE) 4 g packet Take 1 packet by mouth as needed.    . clopidogrel (PLAVIX) 75 MG tablet TAKE 1 TABLET BY MOUTH  DAILY 90 tablet 3  . ferrous sulfate 325 (65 FE) MG tablet Take 325 mg by mouth daily.     . fluticasone (FLONASE) 50 MCG/ACT nasal spray Place 1 spray into both nostrils daily. 1 g 0  . furosemide (LASIX) 40 MG tablet Take 20 mg by mouth as needed. May take 40 mg as needed.    . potassium chloride (K-DUR,KLOR-CON) 10 MEQ tablet Take 10 mEq by mouth daily.     . sodium chloride (OCEAN) 0.65 % SOLN nasal spray Place 1 spray into both nostrils 3 (three) times daily as needed for congestion.     No facility-administered medications prior to visit.      Review of Systems:   Constitutional:   No  weight loss, night sweats,  Fevers, chills, + fatigue, or  lassitude.  HEENT:   No headaches,   Difficulty swallowing,  Tooth/dental problems, or  Sore throat,                No sneezing, itching, ear ache, nasal congestion, post nasal drip,   CV:  No chest pain,  Orthopnea, PND, , anasarca, dizziness, palpitations, syncope.   GI  No heartburn, indigestion, abdominal pain, nausea, vomiting, diarrhea, change in bowel habits, loss of appetite, bloody stools.   Resp:   No chest wall deformity  Skin: Skin ulcer GU: no dysuria, change in color of urine, no urgency or frequency.  No flank pain, no hematuria   MS:  No joint pain or swelling.  No decreased range of motion.  No back pain.    Physical Exam  BP 126/70 (BP Location:  Left Arm, Cuff Size: Large)   Pulse 62   Temp 98.2 F (36.8 C) (Oral)   Ht 5\' 6"  (1.676 m)   Wt 285 lb 9.6 oz (129.5 kg)   SpO2 96%   BMI 46.10 kg/m   GEN: A/Ox3; pleasant , NAD, morbidly obese   HEENT:  [place]/AT,  EACs-clear, TMs-wnl, NOSE-clear, THROAT-clear, no lesions, no postnasal drip or exudate noted.   NECK:  Supple w/ fair ROM; no JVD; normal carotid impulses w/o bruits; no thyromegaly or nodules palpated; no lymphadenopathy.    RESP  Clear  P & A; w/o, wheezes/ rales/ or rhonchi. no accessory muscle use, no dullness to percussion  CARD:  RRR, no m/r/g, 1+  peripheral edema, pulses intact, no cyanosis or clubbing.  GI:   Soft & nt; nml bowel sounds; no organomegaly or masses detected.   Musco: Warm bil, no deformities or joint swelling noted.   Neuro: alert, no focal deficits noted.    Skin: Warm, along the left lateral lower extremity irregular lesion with yellow to green drainage margins with surrounding erythema.  Warm to touch.  Very tender    Lab Results:  CBC  Imaging: Dg Chest 2 View  Result Date: 08/24/2018 CLINICAL DATA:  SOB.  Former smoker.  Pt denies chest history. EXAM: CHEST - 2 VIEW COMPARISON:  02/21/2018 FINDINGS: Coarse bronchovascular markings, more conspicuous than on prior study. Linear opacities at the left lung  base stable. No confluent airspace disease. Heart size and mediastinal contours are within normal limits. Aortic Atherosclerosis (ICD10-170.0). No effusion. Anterior vertebral endplate spurring at multiple levels in the mid and lower thoracic spine. IMPRESSION: Progressive bronchitic changes.  No focal infiltrate. Electronically Signed   By: Lucrezia Europe M.D.   On: 08/24/2018 08:37   Ct Chest High Resolution  Result Date: 08/31/2018 CLINICAL DATA:  Cough for several months.  Lymphoma, asthma. EXAM: CT CHEST WITHOUT CONTRAST TECHNIQUE: Multidetector CT imaging of the chest was performed following the standard protocol without intravenous contrast. High resolution imaging of the lungs, as well as inspiratory and expiratory imaging, was performed. COMPARISON:  01/08/2016. FINDINGS: Cardiovascular: Atherosclerotic calcification of the aorta, aortic valve and coronary arteries. Ascending aorta measures 4.1 cm. Pulmonary arteries are enlarged. Heart is at the upper limits of normal in size to mildly enlarged. No pericardial effusion. Mediastinum/Nodes: No pathologically enlarged mediastinal or axillary lymph nodes. Hilar regions are difficult to evaluate without IV contrast. Esophagus is grossly unremarkable. Lungs/Pleura: Mid and lower lung zone predominant bronchiectasis appears post infectious in etiology when compared with 01/08/2016. 12 mm subpleural nodular lesion in the medial left lower lobe (series 5, image 108) appears enlarged from 7 mm on 01/08/2016. Previously measured 11 mm right upper lobe ground-glass nodule now appears as post infectious scarring. Negative for subpleural reticulation, ground-glass, architectural distortion or honeycombing. There is air trapping. No pleural fluid. Airway is unremarkable. Upper Abdomen: Visualized portions of the liver and right adrenal gland are unremarkable. 3.1 cm fat density mass in the left adrenal gland is partially imaged. Visualized portions of the spleen and  stomach are grossly unremarkable. Musculoskeletal: Degenerative changes in the spine. No worrisome lytic or sclerotic lesions. Flowing anterior osteophytosis in the thoracic spine. IMPRESSION: 1. Negative for fibrotic interstitial lung disease. Mid and lower lung zone bronchiectasis, likely postinfectious in etiology. 2. 12 mm medial left lower lobe nodule, enlarged from 01/08/2016. Follow-up CT chest without contrast in 3 months is recommended as malignancy can not be excluded. These results will be called  to the ordering clinician or representative by the Radiologist Assistant, and communication documented in the PACS or zVision Dashboard. 3. Ascending Aortic aneurysm NOS (ICD10-I71.9). Recommend annual imaging followup by CTA or MRA. This recommendation follows 2010 ACCF/AHA/AATS/ACR/ASA/SCA/SCAI/SIR/STS/SVM Guidelines for the Diagnosis and Management of Patients with Thoracic Aortic Disease. Circulation. 2010; 121: H680-S811. Aortic aneurysm NOS (ICD10-I71.9) 4. Left adrenal myelolipoma. 5. Aortic atherosclerosis (ICD10-170.0). Coronary artery calcification. 6. Enlarged pulmonary arteries, indicative of pulmonary arterial hypertension. Electronically Signed   By: Lorin Picket M.D.   On: 08/31/2018 10:15      PFT Results Latest Ref Rng & Units 12/19/2012  FVC-Predicted Pre % 88  FVC-Post L 2.43  FVC-Predicted Post % 84  Pre FEV1/FVC % % 71  Post FEV1/FCV % % 74  FEV1-Pre L 1.81  FEV1-Predicted Pre % 84  FEV1-Post L 1.80  DLCO UNC% % 56  DLCO COR %Predicted % 64  TLC L 4.80  TLC % Predicted % 89  RV % Predicted % 102    No results found for: NITRICOXIDE      Assessment & Plan:   Bronchiectasis (HCC) Prone to recurrent pneumonia in the past.  Known colonization with Pseudomonas that is pansensitive. Recent CT chest showed no evidence of ILD.  However showed bronchiectasis with postinfectious scarring Patient has no active infectious symptoms.  We will add in flutter valve 3 times  daily. Use nebulizer with albuterol as needed.  Plan  Patient Instructions  Begin Keflex 500 mg 3 times daily, take with food for 1 week Eat yogurt daily Continue on Lasix 20 mg daily Keep legs elevated Wash lower extremity ulcer daily pat dry and cover with nonstick bandage Follow-up with dermatology this week .   Continue on CPAP at bedtime Work on healthy weight  Begin flutter valve 3 times daily Mucinex twice daily as needed for cough or congestion May use albuterol nebulizer as needed  We will call with lab results  Follow-up with Dr. Elsworth Soho or Damarkus Balis NP in 3 months and As needed   Please contact office for sooner follow up if symptoms do not improve or worsen or seek emergency care       OSA (obstructive sleep apnea) Doing well on CPAP  Cont on CPAP At bedtime    Dyspnea Appears to have a component of volume overload probable diastolic dysfunction.  Patient did have some improvement with increased diuresis.  She appears to have a component of venous insufficiency as well.  Most likely complicated by morbid obesity. For now we will resume Lasix 20 mg daily.  Patient did feel weak on the higher doses of Lasix.  Lab work/V. bmet is  pending. She is advised on keeping legs elevated.  Low-salt diet.  Lung nodule 12 mm LLL nodule increased slightly in size from 2017  follow up CT chest no contrast in 3 months    Ascending aortic aneurysm (HCC) Noted on CT chest will need to discuss with PCP if yearly follow up is indicated.      [name], NP 09/06/2018

## 2018-09-06 NOTE — Assessment & Plan Note (Signed)
Appears to have a component of volume overload probable diastolic dysfunction.  Patient did have some improvement with increased diuresis.  She appears to have a component of venous insufficiency as well.  Most likely complicated by morbid obesity. For now we will resume Lasix 20 mg daily.  Patient did feel weak on the higher doses of Lasix.  Lab work/V. bmet is  pending. She is advised on keeping legs elevated.  Low-salt diet.

## 2018-09-06 NOTE — Assessment & Plan Note (Signed)
Prone to recurrent pneumonia in the past.  Known colonization with Pseudomonas that is pansensitive. Recent CT chest showed no evidence of ILD.  However showed bronchiectasis with postinfectious scarring Patient has no active infectious symptoms.  We will add in flutter valve 3 times daily. Use nebulizer with albuterol as needed.  Plan  Patient Instructions  Begin Keflex 500 mg 3 times daily, take with food for 1 week Eat yogurt daily Continue on Lasix 20 mg daily Keep legs elevated Wash lower extremity ulcer daily pat dry and cover with nonstick bandage Follow-up with dermatology this week .   Continue on CPAP at bedtime Work on healthy weight  Begin flutter valve 3 times daily Mucinex twice daily as needed for cough or congestion May use albuterol nebulizer as needed  We will call with lab results  Follow-up with Dr. Elsworth Soho or Geneen Dieter NP in 3 months and As needed   Please contact office for sooner follow up if symptoms do not improve or worsen or seek emergency care

## 2018-09-06 NOTE — Assessment & Plan Note (Signed)
Noted on CT chest will need to discuss with PCP if yearly follow up is indicated.

## 2018-09-08 ENCOUNTER — Telehealth: Payer: Self-pay | Admitting: Adult Health

## 2018-09-08 DIAGNOSIS — R0600 Dyspnea, unspecified: Secondary | ICD-10-CM

## 2018-09-08 DIAGNOSIS — R0609 Other forms of dyspnea: Principal | ICD-10-CM

## 2018-09-08 NOTE — Telephone Encounter (Signed)
Called and spoke to pt.  Pt is requesting lab results from 09/06/18.  Tammy please advise. Thanks.

## 2018-09-08 NOTE — Telephone Encounter (Signed)
Unclear what is causing this other than her multiple medical issues  Would order a VQ scan to r/o blood clot . Her kidney function will not allow a Contrasted CT scan .  This will help [place] rule out other issues that are causing her symptoms .  If this is negative we can consider Echo next .  Please make her an appointment next week after her VQ scan to go over results and decide next step .  Please contact office for sooner follow up if symptoms do not improve or worsen or seek emergency care '

## 2018-09-08 NOTE — Telephone Encounter (Signed)
Sorry they were ordered under Dr. Elsworth Soho  And me  Labs were okay , K+ was normal , Kidney fxn was stablle  Cont w/ ov recs Is she feeling any better ????  Please contact office for sooner follow up if symptoms do not improve or worsen or seek emergency care

## 2018-09-08 NOTE — Telephone Encounter (Signed)
Pt is aware of results and voiced her understanding.  Pt states that her sx are baseline since last OV.  She reports of continuous fatigue and sob with exertion.   Will route to Tammy to make aware.

## 2018-09-09 ENCOUNTER — Telehealth: Payer: Self-pay | Admitting: Pharmacy Technician

## 2018-09-09 ENCOUNTER — Other Ambulatory Visit: Payer: Medicare Other

## 2018-09-09 ENCOUNTER — Telehealth: Payer: Self-pay | Admitting: Adult Health

## 2018-09-09 DIAGNOSIS — S81802A Unspecified open wound, left lower leg, initial encounter: Secondary | ICD-10-CM | POA: Diagnosis not present

## 2018-09-09 NOTE — Telephone Encounter (Signed)
Spoke with pt daughter [name]) and she wanted to inform us to call her at [phone] as the pt and daughter will be out of the house. Judeen Hammans also made aware by skype.  Will forward to [place].   Nothing further needed.

## 2018-09-09 NOTE — Telephone Encounter (Signed)
Called and spoke with Patient.  [name], NP recommendations given.  Understanding stated. Patient scheduled with TP, 09/15/18, at 2:30pm.  VQ scan ordered stat to r/o blood clot.  Nothing further at this time.

## 2018-09-09 NOTE — Addendum Note (Signed)
Addended by: Elton Sin on: 09/09/2018 10:24 AM   Modules accepted: Orders

## 2018-09-12 ENCOUNTER — Ambulatory Visit (HOSPITAL_COMMUNITY)
Admission: RE | Admit: 2018-09-12 | Discharge: 2018-09-12 | Disposition: A | Discharge status: Home / Self Care | Payer: Medicare Other | Source: Ambulatory Visit | Attending: Adult Health | Admitting: Adult Health

## 2018-09-12 DIAGNOSIS — R0602 Shortness of breath: Secondary | ICD-10-CM | POA: Diagnosis not present

## 2018-09-12 DIAGNOSIS — R0609 Other forms of dyspnea: Principal | ICD-10-CM

## 2018-09-12 DIAGNOSIS — R0600 Dyspnea, unspecified: Secondary | ICD-10-CM

## 2018-09-12 MED ORDER — TECHNETIUM TC 99M DIETHYLENETRIAME-PENTAACETIC ACID
30.0000 | Freq: Once | INTRAVENOUS | Status: AC | PRN
  Administered 2018-09-12: 30 via RESPIRATORY_TRACT

## 2018-09-12 MED ORDER — TECHNETIUM TO 99M ALBUMIN AGGREGATED
4.0000 | Freq: Once | INTRAVENOUS | Status: AC | PRN
  Administered 2018-09-12: 4 via INTRAVENOUS

## 2018-09-15 ENCOUNTER — Telehealth: Payer: Self-pay | Admitting: Adult Health

## 2018-09-15 ENCOUNTER — Encounter: Payer: Self-pay | Admitting: Adult Health

## 2018-09-15 ENCOUNTER — Ambulatory Visit (INDEPENDENT_AMBULATORY_CARE_PROVIDER_SITE_OTHER): Payer: Medicare Other | Admitting: Adult Health

## 2018-09-15 VITALS — BP 142/78 | HR 65 | Ht 66.0 in | Wt 286.8 lb

## 2018-09-15 DIAGNOSIS — R0609 Other forms of dyspnea: Secondary | ICD-10-CM | POA: Diagnosis not present

## 2018-09-15 DIAGNOSIS — R0600 Dyspnea, unspecified: Secondary | ICD-10-CM

## 2018-09-15 DIAGNOSIS — R911 Solitary pulmonary nodule: Secondary | ICD-10-CM

## 2018-09-15 DIAGNOSIS — G4733 Obstructive sleep apnea (adult) (pediatric): Secondary | ICD-10-CM | POA: Diagnosis not present

## 2018-09-15 DIAGNOSIS — J479 Bronchiectasis, uncomplicated: Secondary | ICD-10-CM

## 2018-09-15 DIAGNOSIS — R5381 Other malaise: Secondary | ICD-10-CM

## 2018-09-15 NOTE — Assessment & Plan Note (Signed)
Bronchiectasis appears stable - prone to recurrent PNA .  No active symptom.  Continue with flutter valve   Plan  Patient Instructions  Continue follow up with Dermatology .   Continue on Lasix 20 mg daily Keep legs elevated Refer to Cardiology - Dr. Tamala Julian   Continue on CPAP at bedtime Work on healthy weight  Continue on flutter valve 3 times daily As needed   Mucinex twice daily as needed for cough or congestion May use albuterol nebulizer as needed  Refer to PT    Follow-up with Dr. Elsworth Soho or Parrett NP in 3 months and As needed   Please contact office for sooner follow up if symptoms do not improve or worsen or seek emergency care

## 2018-09-15 NOTE — Assessment & Plan Note (Signed)
Continue on CPAP At bedtime   Healthy weight .

## 2018-09-15 NOTE — Assessment & Plan Note (Signed)
Progressive DOE - suspect is multifactoral in nature with morbid obesity , multiple co-morbidities, deconditioning .  workup with  Neg HRCT chest for ILD . VQ scan low prob for PE .  Recent labs unrevealing  Bronchiectasis appears stable  May have cardiac component as fluid overload is apparent , does not tolerate increased diuresis  Will refer to cards for further evaluation .   Plan  Patient Instructions  Continue follow up with Dermatology .   Continue on Lasix 20 mg daily Keep legs elevated Refer to Cardiology - Dr. Tamala Julian   Continue on CPAP at bedtime Work on healthy weight  Continue on flutter valve 3 times daily As needed   Mucinex twice daily as needed for cough or congestion May use albuterol nebulizer as needed  Refer to PT    Follow-up with Dr. Elsworth Soho or Parrett NP in 3 months and As needed   Please contact office for sooner follow up if symptoms do not improve or worsen or seek emergency care

## 2018-09-15 NOTE — Telephone Encounter (Signed)
Natasha Ellis with [name] called back with fax number [phone]; phone [phone] She stated that referral just needs specifics on PT; nothing needed fax at this time  Routing message back to [name] per our conversation.

## 2018-09-15 NOTE — Telephone Encounter (Signed)
Patient seen by TP 09/15/18 for follow up Per AVS, TP would like to refer patient for PT Per patient and daughter, they would like [facility] to be called about faxing the order  Called [facility] at the number, was transferred to [facility] and had to [facility] Need to know what information they need for the order, fax number, etc

## 2018-09-15 NOTE — Assessment & Plan Note (Addendum)
Physical deconditioning , morbid obesity are contributing to patient decompensation . Would benefit from PT .  Order sent. She has it at her [facility] facility and would like to do it there.

## 2018-09-15 NOTE — Patient Instructions (Addendum)
Continue follow up with Dermatology .   Continue on Lasix 20 mg daily Keep legs elevated Refer to Cardiology - Dr. Tamala Julian   Continue on CPAP at bedtime Work on healthy weight  Continue on flutter valve 3 times daily As needed   Mucinex twice daily as needed for cough or congestion May use albuterol nebulizer as needed  Refer to PT    Follow-up with Dr. Elsworth Soho or Parrett NP in 3 months and As needed   Please contact office for sooner follow up if symptoms do not improve or worsen or seek emergency care

## 2018-09-15 NOTE — Assessment & Plan Note (Signed)
  CT chest August 31, 2018 showed no ILD.  Mid to lower lung zone bronchiectasis.  And a 12 mm left lower lobe lung nodule. Follow up CT chest 12/2018

## 2018-09-15 NOTE — Progress Notes (Signed)
@Patient  ID: Natasha Ellis, female    DOB: 1931/05/16, 85 y.o.   MRN: [id number]  Chief Complaint  Patient presents with  . Follow-up    Bronchiectasis     Referring provider: Lavone Orn, MD  HPI: 85 year old female former smoker followed for recurrent pneumonia, chronic cough, obstructive sleep apnea on CPAP.  She is colonized with pansensitive Pseudomonas. 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  Treated with multiple systemic therapies last in 2016 Waldenstrom's macroglobulinemia Previously on nocturnal oxygen discontinued in September 2013 Chronic sinus disease status post sinus surgery 2017 [name])  TEST/EVENTS :  08/2015 CT sinus >>Extensive multifocal paranasal sinus disease, most marked in the ethmoid air cell complexes and in the left maxillary antrum  05/2015 CT chest showed bilateral lower lobe consolidation  10/2015 ONO shows minimal desaturations; does not need O2.  01/2016 pansensitive pseudomonas in sputum   PFTs 12/2012 No airway obstruction ratio 74, FEV1 84%, DLCO 56%  spirometry 5/2018does not show any airway obstruction, mild restriction  09/15/2018 Follow up : Bronchiectasis  , Cellulitis , Dyspnea , OSA , Lung nodule  Patient returns for a 2-week follow-up.  Patient was seen last visit for follow-up of recent increased shortness of breath and wheezing.  Felt to have a component of volume overload.  Her Lasix was increased, she did have a 5 pound weight loss.  But no significant decrease in her lower extremity swelling.  But it also made her very weak.  So she returned back to her baseline of Lasix at 20 mg. Dyspnea workup showed a CT chest August 31, 2018 showed no ILD.  Mid to lower lung zone bronchiectasis.  And a 12 mm left lower lobe lung nodule.  She has a planned follow-up CT chest in  May. Patient had persistent dyspnea. Progressively getting worse for last 1-2 years. Gets winded with minimal activity . She is very sedentary . Can not walk any amount of distance without having to rest . Does very light household chores but has to rest and sit while doing them. This has been going on for couple of years but getting worse.    She was set up for a VQ scan that showed very low probability of pulmonary embolism. Recent labs in Dec showed stable h/h.  Patient says overall leg swelling is slightly better to the same. Wearing ted stocking .   Last visit patient was noted to have increased left lower leg drainage and tenderness along her previous basal cell cancer skin cancer.  She was found to have a underlying cellulitis.  She was treated with Keflex . Says she has seen new Dermatology and is doing better. Using new skin cleansing and dresssing. Has leg wrapped today . No fever or increased redness. Has finished abx.  Has a planned excision with dermatology in few weeks.  Continue on CPAP At bedtime  . No issues. Feels rested.    Allergies  Allergen Reactions  . Codeine Other (See Comments)    Crazy-nightmares  . Amoxicillin Diarrhea    Has patient had a PCN reaction causing immediate rash, facial/tongue/throat swelling, SOB or lightheadedness with hypotension:  No Has patient had a PCN reaction causing severe rash involving mucus membranes or skin necrosis: No Has patient had a PCN reaction that required hospitalization No Has patient had a PCN reaction occurring within the last 10 years:  Unknown--rx is diarrhea. If all of the above answers are "NO", then may proceed with Cephalosporin use.    . Cefdinir Cough  . Doxycycline Diarrhea  . Erythromycin Other (See Comments)    GI upset  . Lisinopril Cough  . Oxybutynin Other (See Comments)    Dry mouth  . Sulfa Antibiotics Other (See Comments)    GI upset    Immunization History  Administered Date(s) Administered  .  Influenza Whole 05/09/2012  . Influenza, High Dose Seasonal PF 05/24/2017  . Influenza,inj,Quad PF,6+ Mos 04/30/2013, 10/24/2015, 04/22/2016, 05/09/2018  . Influenza-Unspecified 05/03/2014  . Pneumococcal Polysaccharide-23 08/03/2009    Past Medical History:  Diagnosis Date  . Arthritis   . Asthma   . Cancer ([place])    Lymphoma  . Cataract   . Chronic cough   . Diverticulosis of intestine    H/O  . DJD (degenerative joint disease) of lumbar spine   . IBS (irritable bowel syndrome)   . Left knee DJD   . Malignant lymphoplasmacytic lymphoma ([place]) 2004   Non-Hodgkin Lymphoma  . Right knee DJD   . Sleep apnea     Tobacco History: Social History   Tobacco Use  Smoking Status Former Smoker  . Packs/day: 1.00  . Years: 25.00  . Pack years: 25.00  . Types: Cigarettes  . Last attempt to quit: 08/04/1979  . Years since quitting: 39.1  Smokeless Tobacco Never Used   Counseling given: Not Answered   Outpatient Medications Prior to Visit  Medication Sig Dispense Refill  . acetaminophen (TYLENOL) 500 MG tablet Take 1,000 mg by mouth every 6 (six) hours as needed for pain.     Marland Kitchen albuterol (PROAIR HFA) 108 (90 Base) MCG/ACT inhaler Inhale 2 puffs into the lungs every 6 (six) hours as needed for wheezing or shortness of breath. 1 Inhaler 3  . albuterol (PROVENTIL) (2.5 MG/3ML) 0.083% nebulizer solution Take 3 mLs (2.5 mg total) by nebulization every 4 (four) hours as needed for wheezing or shortness of breath. 75 mL 1  . alendronate (FOSAMAX) 70 MG tablet Take 70 mg by mouth once a week. Sundays    . calcium gluconate 500 MG tablet Take 1 tablet by mouth daily.    . cholestyramine light (PREVALITE) 4 g packet Take 1 packet by mouth as needed.    . clopidogrel (PLAVIX) 75 MG tablet TAKE 1 TABLET BY MOUTH  DAILY 90 tablet 3  . ferrous sulfate 325 (65 FE) MG tablet Take 325 mg by mouth daily.     . fluticasone (FLONASE) 50 MCG/ACT nasal spray Place 1 spray into both nostrils daily. 1 g 0    . furosemide (LASIX) 40 MG tablet Take 20 mg by mouth as needed. May take 40 mg as needed.    . potassium chloride (K-DUR,KLOR-CON) 10 MEQ tablet Take 10 mEq by mouth daily.     Marland Kitchen Respiratory Therapy Supplies (FLUTTER) DEVI Use as directed. 1 each 0  . sodium chloride (OCEAN) 0.65 % SOLN nasal spray Place 1 spray into both nostrils 3 (three) times daily as needed for congestion.    . cephALEXin (KEFLEX) 500 MG capsule Take 1 capsule (500 mg total) by mouth 3 (three) times daily. (Patient not taking: Reported on 09/15/2018) 21 capsule 0   No facility-administered medications prior to visit.      Review of Systems:   Constitutional:   No  weight loss, night sweats,  Fevers, chills, + fatigue, or  lassitude.  HEENT:   No  headaches,  Difficulty swallowing,  Tooth/dental problems, or  Sore throat,                No sneezing, itching, ear ache, nasal congestion, post nasal drip,   CV:  No chest pain,  Orthopnea, PND,+ swelling in lower extremities, No  anasarca, dizziness, palpitations, syncope.   GI  No heartburn, indigestion, abdominal pain, nausea, vomiting, diarrhea, change in bowel habits, loss of appetite, bloody stools.   Resp:  No wheezing.  No chest wall deformity  Skin: no rash or lesions.  GU: no dysuria, change in color of urine, no urgency or frequency.  No flank pain, no hematuria   MS:  No joint pain or swelling.  No decreased range of motion.  No back pain.    Physical Exam  BP (!) 142/78 (BP Location: Right Arm, Cuff Size: Large)   Pulse 65   Ht 5\' 6"  (1.676 m)   Wt 286 lb 12.8 oz (130.1 kg)   SpO2 96%   BMI 46.29 kg/m   GEN: A/Ox3; pleasant , NAD, morbid obese in wc    HEENT:  [place]/AT,    NOSE-clear, THROAT-clear, no lesions, no postnasal drip or exudate noted.   NECK:  Supple w/ fair ROM; no JVD; normal carotid impulses w/o bruits; no thyromegaly or nodules palpated; no lymphadenopathy.    RESP  Clear  P & A; w/o, wheezes/ rales/ or rhonchi. no accessory  muscle use, no dullness to percussion  CARD:  RRR, no m/r/g, 1+ peripheral edema,-TED hose , dressing LLE ,  no cyanosis or clubbing.  GI:   Soft & nt; nml bowel sounds; no organomegaly or masses detected.   Musco: Warm bil, no deformities or joint swelling noted.   Neuro: alert, no focal deficits noted.    Skin: Warm, no lesions or rashes    Lab Results:  CBC    Component Value Date/Time   WBC 5.5 07/18/2018 1221   WBC 4.7 12/06/2017 1007   RBC 4.48 07/18/2018 1221   HGB 12.3 07/18/2018 1221   HGB 12.2 08/06/2017 1125   HCT 38.9 07/18/2018 1221   HCT 36.7 08/06/2017 1125   PLT 295 07/18/2018 1221   PLT 248 08/06/2017 1125   MCV 86.8 07/18/2018 1221   MCV 88.2 08/06/2017 1125   MCH 27.5 07/18/2018 1221   MCHC 31.6 07/18/2018 1221   RDW 14.8 07/18/2018 1221   RDW 15.8 (H) 08/06/2017 1125   LYMPHSABS 1.7 07/18/2018 1221   LYMPHSABS 1.3 08/06/2017 1125   MONOABS 0.4 07/18/2018 1221   MONOABS 0.3 08/06/2017 1125   EOSABS 0.2 07/18/2018 1221   EOSABS 0.1 08/06/2017 1125   BASOSABS 0.0 07/18/2018 1221   BASOSABS 0.0 08/06/2017 1125    BMET    Component Value Date/Time   NA 143 09/06/2018 1125   NA 143 08/06/2017 1125   K 3.6 09/06/2018 1125   K 3.6 09/06/2018 1125   K 4.1 08/06/2017 1125   CL 104 09/06/2018 1125   CL 107 10/24/2012 1056   CO2 29 09/06/2018 1125   CO2 27 08/06/2017 1125   GLUCOSE 100 (H) 09/06/2018 1125   GLUCOSE 102 08/06/2017 1125   GLUCOSE 125 (H) 10/24/2012 1056   BUN 18 09/06/2018 1125   BUN 19.4 08/06/2017 1125   CREATININE 1.19 09/06/2018 1125   CREATININE 1.19 (H) 07/18/2018 1221   CREATININE 1.2 (H) 08/06/2017 1125   CALCIUM 9.7 09/06/2018 1125   CALCIUM 9.9 08/06/2017 1125   GFRNONAA 42 (L)  07/18/2018 1221   GFRAA 48 (L) 07/18/2018 1221    BNP    Component Value Date/Time   BNP 135.4 (H) 09/20/2014 0305    ProBNP    Component Value Date/Time   PROBNP 70.0 03/30/2013 1631    Imaging: Dg Chest 2 View  Result Date:  09/12/2018 CLINICAL DATA:  Shortness of Breath EXAM: CHEST - 2 VIEW COMPARISON:  Chest radiograph August 23, 2018 and chest CT August 30, 2018 FINDINGS: There is central peribronchial thickening. There is no edema or consolidation. Heart size and pulmonary vascularity are normal. No adenopathy. There is aortic atherosclerosis. No bone lesions. IMPRESSION: Stable central bronchitis. No edema or consolidation. Stable cardiac silhouette. Aortic Atherosclerosis (ICD10-I70.0). Electronically Signed   By: Lowella Grip III M.D.   On: 09/12/2018 15:20   Dg Chest 2 View  Result Date: 08/24/2018 CLINICAL DATA:  SOB.  Former smoker.  Pt denies chest history. EXAM: CHEST - 2 VIEW COMPARISON:  02/21/2018 FINDINGS: Coarse bronchovascular markings, more conspicuous than on prior study. Linear opacities at the left lung base stable. No confluent airspace disease. Heart size and mediastinal contours are within normal limits. Aortic Atherosclerosis (ICD10-170.0). No effusion. Anterior vertebral endplate spurring at multiple levels in the mid and lower thoracic spine. IMPRESSION: Progressive bronchitic changes.  No focal infiltrate. Electronically Signed   By: Lucrezia Europe M.D.   On: 08/24/2018 08:37   Ct Chest High Resolution  Result Date: 08/31/2018 CLINICAL DATA:  Cough for several months.  Lymphoma, asthma. EXAM: CT CHEST WITHOUT CONTRAST TECHNIQUE: Multidetector CT imaging of the chest was performed following the standard protocol without intravenous contrast. High resolution imaging of the lungs, as well as inspiratory and expiratory imaging, was performed. COMPARISON:  01/08/2016. FINDINGS: Cardiovascular: Atherosclerotic calcification of the aorta, aortic valve and coronary arteries. Ascending aorta measures 4.1 cm. Pulmonary arteries are enlarged. Heart is at the upper limits of normal in size to mildly enlarged. No pericardial effusion. Mediastinum/Nodes: No pathologically enlarged mediastinal or axillary lymph  nodes. Hilar regions are difficult to evaluate without IV contrast. Esophagus is grossly unremarkable. Lungs/Pleura: Mid and lower lung zone predominant bronchiectasis appears post infectious in etiology when compared with 01/08/2016. 12 mm subpleural nodular lesion in the medial left lower lobe (series 5, image 108) appears enlarged from 7 mm on 01/08/2016. Previously measured 11 mm right upper lobe ground-glass nodule now appears as post infectious scarring. Negative for subpleural reticulation, ground-glass, architectural distortion or honeycombing. There is air trapping. No pleural fluid. Airway is unremarkable. Upper Abdomen: Visualized portions of the liver and right adrenal gland are unremarkable. 3.1 cm fat density mass in the left adrenal gland is partially imaged. Visualized portions of the spleen and stomach are grossly unremarkable. Musculoskeletal: Degenerative changes in the spine. No worrisome lytic or sclerotic lesions. Flowing anterior osteophytosis in the thoracic spine. IMPRESSION: 1. Negative for fibrotic interstitial lung disease. Mid and lower lung zone bronchiectasis, likely postinfectious in etiology. 2. 12 mm medial left lower lobe nodule, enlarged from 01/08/2016. Follow-up CT chest without contrast in 3 months is recommended as malignancy can not be excluded. These results will be called to the ordering clinician or representative by the Radiologist Assistant, and communication documented in the PACS or zVision Dashboard. 3. Ascending Aortic aneurysm NOS (ICD10-I71.9). Recommend annual imaging followup by CTA or MRA. This recommendation follows 2010 ACCF/AHA/AATS/ACR/ASA/SCA/SCAI/SIR/STS/SVM Guidelines for the Diagnosis and Management of Patients with Thoracic Aortic Disease. Circulation. 2010; 121: A416-S063. Aortic aneurysm NOS (ICD10-I71.9) 4. Left adrenal myelolipoma. 5. Aortic atherosclerosis (  ICD10-170.0). Coronary artery calcification. 6. Enlarged pulmonary arteries, indicative of  pulmonary arterial hypertension. Electronically Signed   By: Lorin Picket M.D.   On: 08/31/2018 10:15   Nm Pulmonary Perf And Vent  Result Date: 09/12/2018 CLINICAL DATA:  Shortness of Breath EXAM: NUCLEAR MEDICINE VENTILATION - PERFUSION LUNG SCAN VIEWS: Anterior, posterior, left lateral, right lateral, RPO, LPO, RAO, LAO-ventilation and perfusion RADIOPHARMACEUTICALS:  30.8 mCi of Tc-34m DTPA aerosol inhalation and 4.2 mCi Tc61m MAA IV COMPARISON:  Chest radiograph September 12, 2018 FINDINGS: Ventilation: Radiotracer uptake bilaterally is homogeneous and symmetric. No focal ventilation defects are evident. Perfusion: Radiotracer uptake is homogeneous and symmetric bilaterally. No appreciable perfusion defects. No ventilation/perfusion mismatch evident. Heart is noted to be prominent. IMPRESSION: No appreciable ventilation or perfusion defects. Very low probability of pulmonary embolus. Heart noted to be prominent. Electronically Signed   By: Lowella Grip III M.D.   On: 09/12/2018 15:19      PFT Results Latest Ref Rng & Units 12/19/2012  FVC-Predicted Pre % 88  FVC-Post L 2.43  FVC-Predicted Post % 84  Pre FEV1/FVC % % 71  Post FEV1/FCV % % 74  FEV1-Pre L 1.81  FEV1-Predicted Pre % 84  FEV1-Post L 1.80  DLCO UNC% % 56  DLCO COR %Predicted % 64  TLC L 4.80  TLC % Predicted % 89  RV % Predicted % 102    No results found for: NITRICOXIDE      Assessment & Plan:   OSA (obstructive sleep apnea) Continue on CPAP At bedtime   Healthy weight .    Bronchiectasis ([place]) Bronchiectasis appears stable - prone to recurrent PNA .  No active symptom.  Continue with flutter valve   Plan  Patient Instructions  Continue follow up with Dermatology .   Continue on Lasix 20 mg daily Keep legs elevated Refer to Cardiology - Dr. Tamala Julian   Continue on CPAP at bedtime Work on healthy weight  Continue on flutter valve 3 times daily As needed   Mucinex twice daily as needed for cough  or congestion May use albuterol nebulizer as needed  Refer to PT    Follow-up with Dr. Elsworth Soho or Wonder Donaway NP in 3 months and As needed   Please contact office for sooner follow up if symptoms do not improve or worsen or seek emergency care       Lung nodule  CT chest August 31, 2018 showed no ILD.  Mid to lower lung zone bronchiectasis.  And a 12 mm left lower lobe lung nodule. Follow up CT chest 12/2018   Physical deconditioning Physical deconditioning , morbid obesity are contributing to patient decompensation . Would benefit from PT .  Order sent. She has it at her retirement facility and would like to do it there.    Dyspnea Progressive DOE - suspect is multifactoral in nature with morbid obesity , multiple co-morbidities, deconditioning .  workup with  Neg HRCT chest for ILD . VQ scan low prob for PE .  Recent labs unrevealing  Bronchiectasis appears stable  May have cardiac component as fluid overload is apparent , does not tolerate increased diuresis  Will refer to cards for further evaluation .   Plan  Patient Instructions  Continue follow up with Dermatology .   Continue on Lasix 20 mg daily Keep legs elevated Refer to Cardiology - Dr. Tamala Julian   Continue on CPAP at bedtime Work on healthy weight  Continue on flutter valve 3 times daily As needed  Mucinex twice daily as needed for cough or congestion May use albuterol nebulizer as needed  Refer to PT    Follow-up with Dr. Elsworth Soho or Gid Schoffstall NP in 3 months and As needed   Please contact office for sooner follow up if symptoms do not improve or worsen or seek emergency care            [name], NP 09/15/2018

## 2018-09-15 NOTE — Telephone Encounter (Signed)
Marli returning call CB# [phone]

## 2018-09-16 NOTE — Telephone Encounter (Signed)
Referral to [place] for PT to be done at [facility] per 2.13.2020 office visit Patient and daughter already aware at visit yesterday Nothing further needed; will sign off

## 2018-09-19 ENCOUNTER — Telehealth: Payer: Self-pay | Admitting: Adult Health

## 2018-09-19 NOTE — Telephone Encounter (Signed)
I spoke to [place] at [facility] who couldn't say whether or not they rec'd the faxed order/referral that was sent on 2/14 so I re-faxed this to [facility], [phone].  I called pt to let her know that I did fax the paperwork to [facility] on Friday & that I re-faxed it to them this afternoon.  Pt stated nothing further needed at this time.

## 2018-09-19 NOTE — Telephone Encounter (Signed)
Called and spoke with Patient.  She stated that Tammy P, NP, was placing a PT order to be done at [facility].  The Patient stated that [employer] [facility] hasnot received a PT order. She stated that there fax number was [phone]. PT order was placed 09/16/18.  Patient is aware that it may take a few days for orders and insurance approval.   Will route to [facility] to follow up

## 2018-09-20 DIAGNOSIS — R0609 Other forms of dyspnea: Secondary | ICD-10-CM | POA: Diagnosis not present

## 2018-09-20 DIAGNOSIS — M6281 Muscle weakness (generalized): Secondary | ICD-10-CM | POA: Diagnosis not present

## 2018-09-20 DIAGNOSIS — R262 Difficulty in walking, not elsewhere classified: Secondary | ICD-10-CM | POA: Diagnosis not present

## 2018-09-21 NOTE — Telephone Encounter (Signed)
Natasha Claude- is there something needed here???  No documentation??

## 2018-09-22 DIAGNOSIS — R0609 Other forms of dyspnea: Secondary | ICD-10-CM | POA: Diagnosis not present

## 2018-09-22 DIAGNOSIS — M6281 Muscle weakness (generalized): Secondary | ICD-10-CM | POA: Diagnosis not present

## 2018-09-22 DIAGNOSIS — R262 Difficulty in walking, not elsewhere classified: Secondary | ICD-10-CM | POA: Diagnosis not present

## 2018-09-23 ENCOUNTER — Encounter: Payer: Self-pay | Admitting: Cardiology

## 2018-09-25 DIAGNOSIS — R262 Difficulty in walking, not elsewhere classified: Secondary | ICD-10-CM | POA: Diagnosis not present

## 2018-09-25 DIAGNOSIS — M6281 Muscle weakness (generalized): Secondary | ICD-10-CM | POA: Diagnosis not present

## 2018-09-25 DIAGNOSIS — R0609 Other forms of dyspnea: Secondary | ICD-10-CM | POA: Diagnosis not present

## 2018-09-27 DIAGNOSIS — R262 Difficulty in walking, not elsewhere classified: Secondary | ICD-10-CM | POA: Diagnosis not present

## 2018-09-27 DIAGNOSIS — R0609 Other forms of dyspnea: Secondary | ICD-10-CM | POA: Diagnosis not present

## 2018-09-27 DIAGNOSIS — M6281 Muscle weakness (generalized): Secondary | ICD-10-CM | POA: Diagnosis not present

## 2018-09-29 DIAGNOSIS — R262 Difficulty in walking, not elsewhere classified: Secondary | ICD-10-CM | POA: Diagnosis not present

## 2018-09-29 DIAGNOSIS — R0609 Other forms of dyspnea: Secondary | ICD-10-CM | POA: Diagnosis not present

## 2018-09-29 DIAGNOSIS — M6281 Muscle weakness (generalized): Secondary | ICD-10-CM | POA: Diagnosis not present

## 2018-10-02 DIAGNOSIS — M6281 Muscle weakness (generalized): Secondary | ICD-10-CM | POA: Diagnosis not present

## 2018-10-02 DIAGNOSIS — R0609 Other forms of dyspnea: Secondary | ICD-10-CM | POA: Diagnosis not present

## 2018-10-02 DIAGNOSIS — R262 Difficulty in walking, not elsewhere classified: Secondary | ICD-10-CM | POA: Diagnosis not present

## 2018-10-04 DIAGNOSIS — R262 Difficulty in walking, not elsewhere classified: Secondary | ICD-10-CM | POA: Diagnosis not present

## 2018-10-04 DIAGNOSIS — M6281 Muscle weakness (generalized): Secondary | ICD-10-CM | POA: Diagnosis not present

## 2018-10-04 DIAGNOSIS — R0609 Other forms of dyspnea: Secondary | ICD-10-CM | POA: Diagnosis not present

## 2018-10-06 DIAGNOSIS — R0609 Other forms of dyspnea: Secondary | ICD-10-CM | POA: Diagnosis not present

## 2018-10-06 DIAGNOSIS — M6281 Muscle weakness (generalized): Secondary | ICD-10-CM | POA: Diagnosis not present

## 2018-10-06 DIAGNOSIS — R262 Difficulty in walking, not elsewhere classified: Secondary | ICD-10-CM | POA: Diagnosis not present

## 2018-10-10 DIAGNOSIS — M6281 Muscle weakness (generalized): Secondary | ICD-10-CM | POA: Diagnosis not present

## 2018-10-10 DIAGNOSIS — R0609 Other forms of dyspnea: Secondary | ICD-10-CM | POA: Diagnosis not present

## 2018-10-10 DIAGNOSIS — R262 Difficulty in walking, not elsewhere classified: Secondary | ICD-10-CM | POA: Diagnosis not present

## 2018-10-11 DIAGNOSIS — H906 Mixed conductive and sensorineural hearing loss, bilateral: Secondary | ICD-10-CM | POA: Diagnosis not present

## 2018-10-12 ENCOUNTER — Ambulatory Visit (INDEPENDENT_AMBULATORY_CARE_PROVIDER_SITE_OTHER): Payer: Medicare Other | Admitting: Cardiology

## 2018-10-12 ENCOUNTER — Encounter: Payer: Self-pay | Admitting: Cardiology

## 2018-10-12 ENCOUNTER — Other Ambulatory Visit: Payer: Self-pay

## 2018-10-12 VITALS — BP 146/72 | HR 69 | Ht 66.0 in | Wt 287.8 lb

## 2018-10-12 DIAGNOSIS — R600 Localized edema: Secondary | ICD-10-CM | POA: Diagnosis not present

## 2018-10-12 DIAGNOSIS — M6281 Muscle weakness (generalized): Secondary | ICD-10-CM | POA: Diagnosis not present

## 2018-10-12 DIAGNOSIS — R0609 Other forms of dyspnea: Secondary | ICD-10-CM | POA: Diagnosis not present

## 2018-10-12 DIAGNOSIS — R0602 Shortness of breath: Secondary | ICD-10-CM

## 2018-10-12 DIAGNOSIS — I10 Essential (primary) hypertension: Secondary | ICD-10-CM | POA: Diagnosis not present

## 2018-10-12 DIAGNOSIS — R262 Difficulty in walking, not elsewhere classified: Secondary | ICD-10-CM | POA: Diagnosis not present

## 2018-10-12 NOTE — Progress Notes (Signed)
Cardiology Office Note    Date:  10/12/2018   ID:  Natasha Ellis, DOB September 07, 1930, MRN [id number]  PCP:  Lavone Orn, MD  Cardiologist:  Fransico Him, MD   Chief Complaint  Patient presents with  . New Patient (Initial Visit)    Shortness of breath, lower extremity edema    History of Present Illness:  Natasha Ellis is a 85 y.o. female who is being seen today for the evaluation of lower extremity edema and shortness of breath at the request of Parrett, Tammy S, NP.  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  She was treated with multiple systemic therapies with the last being in 2016 for Waldenstrom's macroglobulinemia.  She is followed by pulmonary.    She was seen by pulmonary on 09/15/2018 and was felt to be of volume overloaded with increased shortness of breath and wheezing.  Her Lasix was increased and she lost 5 pounds but no significant decrease in lower extremity edema.  She said her Lasix was decreased to 20 mg a day.  Chest CT in January showed no interstitial lung disease but did show mid to lower lung zone bronchiectasis and a lung nodule.    She has had persistent worsening of her dyspnea for the past 1 to 2 years and get shortness of breath with minimal activity but is very sedentary.  This is affected her ADLs.  VQ scan showed low probability for PE.She was prescribed compression hose for her lower extremity edema.  Now referred to cardiology for further evaluation of lower extremity edema and shortness of breath.  She has not had a 2D echocardiogram done since 2014 and grade 1 diastolic dysfunction and normal LV function.  He denies any chest pain, PND, orthopnea, palpitations, dizziness or  syncope.  Past Medical History:  Diagnosis Date  . Arthritis   . Asthma   . Cancer ([place])    Lymphoma  . Cataract   . Chronic cough   . Diverticulosis of intestine    H/O  . DJD (degenerative joint disease) of lumbar spine   . IBS (irritable bowel syndrome)   . Left knee DJD   . Malignant lymphoplasmacytic lymphoma ([place]) 2004   Non-Hodgkin Lymphoma  . Right knee DJD   . Sleep apnea     Past Surgical History:  Procedure Laterality Date  . ABDOMINAL HYSTERECTOMY  1984   TAH  . EYE SURGERY Bilateral    with lens replacement  . EYE SURGERY Left    retnia  . KNEE ARTHROSCOPY Left   . NASAL SINUS SURGERY N/A 12/04/2015   Procedure: ENDOSCOPIC SINUS SURGERY;  Surgeon: Izora Gala, MD;  Location: [facility];  Service: ENT;  Laterality: N/A;  . TEE WITHOUT CARDIOVERSION N/A 04/14/2016   Procedure: TRANSESOPHAGEAL ECHOCARDIOGRAM (TEE);  Surgeon: Lelon Perla, MD;  Location: [facility] ENDOSCOPY;  Service: Cardiovascular;  Laterality: N/A;  . TONSILLECTOMY AND ADENOIDECTOMY  childhood    Current Medications: No outpatient medications have been marked as taking for the 10/12/18 encounter (Office Visit) with Sueanne Margarita, MD.    Allergies:   Codeine; Amoxicillin; Cefdinir; Doxycycline; Erythromycin; Lisinopril; Oxybutynin; and Sulfa antibiotics   Social History   Socioeconomic History  . Marital status: Widowed    Spouse name: Not  on file  . Number of children: 1  . Years of education: 30  . Highest education level: Not on file  Occupational History  . Not on file  Social Needs  . Financial resource strain: Not on file  . Food insecurity:    Worry: Not on file    Inability: Not on file  . Transportation needs:    Medical: Not on file    Non-medical: Not on file  Tobacco Use  . Smoking status: Former Smoker    Packs/day: 1.00    Years: 25.00    Pack years: 25.00    Types: Cigarettes    Last attempt to quit: 08/04/1979    Years since quitting: 39.2  . Smokeless tobacco: Never  Used  Substance and Sexual Activity  . Alcohol use: No  . Drug use: No  . Sexual activity: Not on file  Lifestyle  . Physical activity:    Days per week: Not on file    Minutes per session: Not on file  . Stress: Not on file  Relationships  . Social connections:    Talks on phone: Not on file    Gets together: Not on file    Attends religious service: Not on file    Active member of club or organization: Not on file    Attends meetings of clubs or organizations: Not on file    Relationship status: Not on file  Other Topics Concern  . Not on file  Social History Narrative   ** Merged History Encounter **       She lives in a [place] home at [facility].    Highest level of education:  College, business     Family History:  The patient's family history includes Breast cancer in her sister; Heart failure (age of onset: 83) in her mother; Stroke in her father.   ROS:   Please see the history of present illness.    ROS All other systems reviewed and are negative.  No flowsheet data found.     PHYSICAL EXAM:   VS:  Pulse 69   Ht 5\' 6"  (1.676 m)   Wt 287 lb 12.8 oz (130.5 kg)   SpO2 96%   BMI 46.45 kg/m    GEN: Well nourished, well developed, in no acute distress  HEENT: normal  Neck: no JVD, carotid bruits, or masses Cardiac: RRR; no murmurs, rubs, or gallops,no edema.  Intact distal pulses bilaterally.  Respiratory:  clear to auscultation bilaterally, normal work of breathing GI: soft, nontender, nondistended, + BS MS: no deformity or atrophy  Skin: warm and dry, no rash Neuro:  Alert and Oriented x 3, Strength and sensation are intact Psych: euthymic mood, full affect  Wt Readings from Last 3 Encounters:  10/12/18 287 lb 12.8 oz (130.5 kg)  09/15/18 286 lb 12.8 oz (130.1 kg)  09/06/18 285 lb 9.6 oz (129.5 kg)      Studies/Labs Reviewed:   EKG:  EKG is ordered today.   Recent Labs: 07/18/2018: ALT 15; Hemoglobin 12.3; Platelet Count 295 09/06/2018:  BUN 18; Creatinine, Ser 1.19; Potassium 3.6; Potassium 3.6; Sodium 143   Lipid Panel    Component Value Date/Time   CHOL 123 05/05/2017 1013   CHOL 149 03/24/2016 0000   TRIG 83.0 05/05/2017 1013   HDL 38.80 (L) 05/05/2017 1013   HDL 52 03/24/2016 0000   CHOLHDL 3 05/05/2017 1013   VLDL 16.6 05/05/2017 1013   LDLCALC 68 05/05/2017 1013   LDLCALC  82 03/24/2016 0000    Additional studies/ records that were reviewed today include:  Office notes from pulmonary     ASSESSMENT:    1. Shortness of breath   2. Essential hypertension   3. Lower extremity edema      PLAN:  In order of problems listed above:  1.  Shortness of breath - has chronic lung problems including bronchiectasis with chronic colonization with Pseudomonas with recurrent cough and chronic shortness of breath.  She recently was noted be volume overloaded and likely has diastolic CHF.  She is also very sedentary likely playing a role in her shortness of breath.  Her shortness of breath though has been getting worse over the past 1 to 2 years.  Chest CT ruled out interstitial lung disease.  VQ scan was negative for PE.  She does have OSA on CPAP.  I recommended repeating a 2D echocardiogram as this has not been done in many years.  We will assess LV function and diastolic function.  I have also recommended a Lexiscan Myoview to rule out ischemia.  She does have cardiac risk factors including a remote history of tobacco use as well as family history of CAD.  I will also check a BNP.  2.  Hypertension -BP is well controlled on exam today.  Not required any antihypertensive therapy.  3.  Lower extremity edema -improved on diuretic therapy.  She is on Lasix 20 mg daily.  She did not tolerate a higher dose of Lasix due to feeling wiped out.    Medication Adjustments/Labs and Tests Ordered: Current medicines are reviewed at length with the patient today.  Concerns regarding medicines are outlined above.  Medication changes,  Labs and Tests ordered today are listed in the Patient Instructions below.  There are no Patient Instructions on file for this visit.   Signed, Fransico Him, MD  10/12/2018 10:51 AM    [address], [place], [place]  [address] Phone: [phone]; Fax: [phone]

## 2018-10-12 NOTE — Patient Instructions (Signed)
Medication Instructions:  Your physician recommends that you continue on your current medications as directed. Please refer to the Current Medication list given to you today.  If you need a refill on your cardiac medications before your next appointment, please call your pharmacy.   Lab work: Today: ProBNP, BMET  If you have labs (blood work) drawn today and your tests are completely normal, you will receive your results only by: Marland Kitchen MyChart Message (if you have MyChart) OR . A paper copy in the mail If you have any lab test that is abnormal or we need to change your treatment, we will call you to review the results.  Testing/Procedures: Your physician has requested that you have an echocardiogram. Echocardiography is a painless test that uses sound waves to create images of your heart. It provides your doctor with information about the size and shape of your heart and how well your heart's chambers and valves are working. This procedure takes approximately one hour. There are no restrictions for this procedure.  Your physician has requested that you have a lexiscan myoview. For further information please visit [email or web address]. Please follow instruction sheet, as given.  Follow-Up: As needed, following results.

## 2018-10-13 LAB — BASIC METABOLIC PANEL
BUN/Creatinine Ratio: 14 (ref 12–28)
BUN: 15 mg/dL (ref 8–27)
CO2: 23 mmol/L (ref 20–29)
Calcium: 9.5 mg/dL (ref 8.7–10.3)
Chloride: 106 mmol/L (ref 96–106)
Creatinine, Ser: 1.1 mg/dL — ABNORMAL HIGH (ref 0.57–1.00)
GFR calc Af Amer: 53 mL/min/{1.73_m2} — ABNORMAL LOW (ref >59)
GFR calc non Af Amer: 46 mL/min/{1.73_m2} — ABNORMAL LOW (ref >59)
Glucose: 87 mg/dL (ref 65–99)
Potassium: 4.5 mmol/L (ref 3.5–5.2)
Sodium: 146 mmol/L — ABNORMAL HIGH (ref 134–144)

## 2018-10-13 LAB — PRO B NATRIURETIC PEPTIDE: NT-Pro BNP: 455 pg/mL (ref 0–738)

## 2018-10-16 DIAGNOSIS — M6281 Muscle weakness (generalized): Secondary | ICD-10-CM | POA: Diagnosis not present

## 2018-10-16 DIAGNOSIS — R262 Difficulty in walking, not elsewhere classified: Secondary | ICD-10-CM | POA: Diagnosis not present

## 2018-10-16 DIAGNOSIS — R0609 Other forms of dyspnea: Secondary | ICD-10-CM | POA: Diagnosis not present

## 2018-10-17 DIAGNOSIS — C44719 Basal cell carcinoma of skin of left lower limb, including hip: Secondary | ICD-10-CM | POA: Diagnosis not present

## 2018-10-24 ENCOUNTER — Telehealth: Payer: Self-pay

## 2018-10-24 NOTE — Telephone Encounter (Signed)
New message   Spoke with patient per staff messages from Dr. Radford Pax echocardiogram and myocardial perfusion has been rescheduled to 5/20.20 & 12/22/18.

## 2018-10-24 NOTE — Telephone Encounter (Signed)
Spoke with the patient, she stated she is SOB, but has been for 2 months. Her SOB has not worsened since our visit on 3/11. I advised the patient, that Dr. Radford Pax requested she delay. I advised if she had any worsening symptoms to call our office back. She had no further questions.

## 2018-10-24 NOTE — Telephone Encounter (Signed)
OK to wait 4 weeks for nuclear stress test and echo. Asahel Risden, Please call patient to find out how she is feeling.  Traci

## 2018-11-02 ENCOUNTER — Ambulatory Visit (HOSPITAL_COMMUNITY): Payer: Medicare Other

## 2018-11-02 ENCOUNTER — Other Ambulatory Visit (HOSPITAL_COMMUNITY): Payer: Medicare Other

## 2018-11-03 ENCOUNTER — Ambulatory Visit (HOSPITAL_COMMUNITY): Payer: Medicare Other

## 2018-11-03 ENCOUNTER — Other Ambulatory Visit (HOSPITAL_COMMUNITY): Payer: Medicare Other

## 2018-11-16 DIAGNOSIS — T8141XA Infection following a procedure, superficial incisional surgical site, initial encounter: Secondary | ICD-10-CM | POA: Diagnosis not present

## 2018-12-09 DIAGNOSIS — R05 Cough: Secondary | ICD-10-CM | POA: Diagnosis not present

## 2018-12-09 DIAGNOSIS — R509 Fever, unspecified: Secondary | ICD-10-CM | POA: Diagnosis not present

## 2018-12-09 DIAGNOSIS — Z03818 Encounter for observation for suspected exposure to other biological agents ruled out: Secondary | ICD-10-CM | POA: Diagnosis not present

## 2018-12-19 ENCOUNTER — Telehealth (HOSPITAL_COMMUNITY): Payer: Self-pay | Admitting: *Deleted

## 2018-12-19 NOTE — Telephone Encounter (Signed)
Patient given detailed instructions per Myocardial Perfusion Study Information Sheet for the test on 12/21/18. Patient notified to arrive 15 minutes early and that it is imperative to arrive on time for appointment to keep from having the test rescheduled.  If you need to cancel or reschedule your appointment, please call the office within 24 hours of your appointment. . Patient verbalized understanding.Natasha Ellis Jacqueline    

## 2018-12-20 ENCOUNTER — Ambulatory Visit: Payer: Medicare Other | Admitting: Pulmonary Disease

## 2018-12-21 ENCOUNTER — Other Ambulatory Visit (HOSPITAL_COMMUNITY): Payer: Medicare Other

## 2018-12-21 ENCOUNTER — Ambulatory Visit (HOSPITAL_BASED_OUTPATIENT_CLINIC_OR_DEPARTMENT_OTHER): Payer: Medicare Other

## 2018-12-21 ENCOUNTER — Ambulatory Visit (HOSPITAL_COMMUNITY): Payer: Medicare Other | Attending: Cardiology

## 2018-12-21 ENCOUNTER — Other Ambulatory Visit: Payer: Self-pay

## 2018-12-21 DIAGNOSIS — R0602 Shortness of breath: Secondary | ICD-10-CM | POA: Insufficient documentation

## 2018-12-21 LAB — MYOCARDIAL PERFUSION IMAGING
LV dias vol: 90 mL (ref 46–106)
LV sys vol: 30 mL
Peak HR: 79 {beats}/min
Rest HR: 64 {beats}/min
SDS: 0
SRS: 0
SSS: 0
TID: 1.17

## 2018-12-21 MED ORDER — REGADENOSON 0.4 MG/5ML IV SOLN
0.4000 mg | Freq: Once | INTRAVENOUS | Status: AC
  Administered 2018-12-21: 0.4 mg via INTRAVENOUS

## 2018-12-21 MED ORDER — TECHNETIUM TC 99M TETROFOSMIN IV KIT
28.7000 | PACK | Freq: Once | INTRAVENOUS | Status: AC | PRN
  Administered 2018-12-21: 28.7 via INTRAVENOUS
  Filled 2018-12-21: qty 29

## 2018-12-21 MED ORDER — TECHNETIUM TC 99M TETROFOSMIN IV KIT
8.9000 | PACK | Freq: Once | INTRAVENOUS | Status: AC | PRN
  Administered 2018-12-21: 8.9 via INTRAVENOUS
  Filled 2018-12-21: qty 9

## 2018-12-22 ENCOUNTER — Ambulatory Visit (HOSPITAL_COMMUNITY): Payer: Medicare Other

## 2018-12-27 ENCOUNTER — Other Ambulatory Visit (HOSPITAL_COMMUNITY): Payer: Medicare Other

## 2018-12-29 ENCOUNTER — Telehealth: Payer: Self-pay | Admitting: Cardiology

## 2018-12-29 DIAGNOSIS — I7121 Aneurysm of the ascending aorta, without rupture: Secondary | ICD-10-CM

## 2018-12-29 DIAGNOSIS — R0602 Shortness of breath: Secondary | ICD-10-CM

## 2018-12-29 DIAGNOSIS — I712 Thoracic aortic aneurysm, without rupture: Secondary | ICD-10-CM

## 2018-12-29 NOTE — Telephone Encounter (Signed)
New Message    Patient is calling to obtain the results of her stress test and echo that she had done last week. Please call.

## 2018-12-29 NOTE — Telephone Encounter (Signed)
Please have her come in for a BNP and BMET

## 2018-12-29 NOTE — Telephone Encounter (Signed)
Spoke with the patient, she will come in to the office tomorrow to have a ProBNP and BMET.

## 2018-12-29 NOTE — Telephone Encounter (Signed)
Notes recorded by Sueanne Margarita, MD on 12/21/2018 at 9:23 PM EDT Please get a copy of last lipids ------  Notes recorded by Sueanne Margarita, MD on 12/21/2018 at 9:23 PM EDT Please let patient know that echo showed normal LVF with moderately thickened heart muscle and increased stiffness of heart muscle, mild right and left atrium and mildly enlarged ascending aorta at 4cm. Repeat limited echo in 1 year for ascending aortic aneurysm

## 2018-12-29 NOTE — Telephone Encounter (Signed)
The patient expressed understanding about her results. She wanted to know what was next. She still feels SOB and it has gotten worsen then when she saw Dr. Radford Pax in March.

## 2018-12-30 ENCOUNTER — Other Ambulatory Visit: Payer: Self-pay

## 2018-12-30 ENCOUNTER — Other Ambulatory Visit: Payer: Medicare Other | Admitting: *Deleted

## 2018-12-30 DIAGNOSIS — R0602 Shortness of breath: Secondary | ICD-10-CM

## 2018-12-30 LAB — BASIC METABOLIC PANEL
BUN/Creatinine Ratio: 12 (ref 12–28)
BUN: 15 mg/dL (ref 8–27)
CO2: 24 mmol/L (ref 20–29)
Calcium: 9.8 mg/dL (ref 8.7–10.3)
Chloride: 101 mmol/L (ref 96–106)
Creatinine, Ser: 1.23 mg/dL — ABNORMAL HIGH (ref 0.57–1.00)
GFR calc Af Amer: 46 mL/min/{1.73_m2} — ABNORMAL LOW (ref >59)
GFR calc non Af Amer: 40 mL/min/{1.73_m2} — ABNORMAL LOW (ref >59)
Glucose: 111 mg/dL — ABNORMAL HIGH (ref 65–99)
Potassium: 4.6 mmol/L (ref 3.5–5.2)
Sodium: 140 mmol/L (ref 134–144)

## 2018-12-30 LAB — PRO B NATRIURETIC PEPTIDE: NT-Pro BNP: 673 pg/mL (ref 0–738)

## 2019-01-02 ENCOUNTER — Telehealth: Payer: Self-pay | Admitting: *Deleted

## 2019-01-02 NOTE — Telephone Encounter (Signed)
Spoke with pt and informed her that Dr. Benay Spice doubt the symptoms related to [facility]; pt can come in for lab check - CBC, CMET, [name], SPEP this week.   Office visit appt can also be moved to a sooner date. Pt stated she will contact her PCP Dr. Laurann Montana in the am; check with Quincy Carnes for transportation, and pt will call office back with her decision.  Dr. Benay Spice notified.

## 2019-01-02 NOTE — Telephone Encounter (Signed)
Pt called reporting worsening symptoms of Waldenstrom's.  Spoke with pt and was informed that she experiences shortness of breath for few months - but Severe this am.  Stated blurred vision about a month, has chronic fatigue, dull headache, has loose stools for a while, and abdomen swelling, feeling bloated, but no tightness. Stated she was tested for COVID-19 about 3 weeks ago with Negative results by PCP Dr. Laurann Montana. Saw cardiologist last week, had echo and stress test with  Good results. Pt is concerned and wanted to know what Dr. Benay Spice would suggest. Pt's    Phone    [phone].

## 2019-01-05 ENCOUNTER — Telehealth: Payer: Self-pay | Admitting: Adult Health

## 2019-01-05 NOTE — Telephone Encounter (Signed)
Primary Pulmonologist: RA Last office visit and with whom: 09/15/2018 with TP What do we see them for (pulmonary problems): DOE/ cough Last OV assessment/plan: Instructions  Return in about 3 months (around 12/14/2018) for Follow up with Dr. Elsworth Soho.  Continue follow up with Dermatology .   Continue on Lasix 20 mg daily Keep legs elevated Refer to Cardiology - Dr. Tamala Julian   Continue on CPAP at bedtime Work on healthy weight  Continue on flutter valve 3 times daily As needed   Mucinex twice daily as needed for cough or congestion May use albuterol nebulizer as needed  Refer to PT    Follow-up with Dr. Elsworth Soho or Parrett NP in 3 months and As needed   Please contact office for sooner follow up if symptoms do not improve or worsen or seek emergency care        Was appointment offered to patient (explain)?  appt was made for pt.   Reason for call: called and spoke with pt who states she has had increased SOB x3 weeks and states every day becomes worse and worse. Pt stated it is getting to the point that she cannot talk when she becomes so SOB.  Pt stated she saw cardiology and they have cleared her from there being anything wrong with her heart. Pt stated she if she does anything she becomes real SOB.  Pt states she has had to use the nebulizer once daily to help with her symptoms. Pt states that she has coughed occ due to the chronic cough and when she uses nebulizer, she is able to get up clear mucus.  Stated to pt that RA is going to be in office Tues. 6/9 and pt stated she would like to be scheduled for an appt. Pt has been scheduled for appt with RA 6/9 at 11am. Negative covid screen as pt has not had any fever, no recent travel, no contact with anyone that has been sick. Nothing further needed.

## 2019-01-06 ENCOUNTER — Other Ambulatory Visit: Payer: Self-pay

## 2019-01-06 ENCOUNTER — Encounter (HOSPITAL_COMMUNITY): Payer: Self-pay | Admitting: *Deleted

## 2019-01-06 ENCOUNTER — Inpatient Hospital Stay (HOSPITAL_COMMUNITY)
Admission: EM | Admit: 2019-01-06 | Discharge: 2019-01-12 | DRG: 308 | Disposition: A | Discharge status: Skilled Nursing Facility | Payer: Medicare Other | Attending: Internal Medicine | Admitting: Internal Medicine

## 2019-01-06 ENCOUNTER — Emergency Department (HOSPITAL_COMMUNITY): Payer: Medicare Other

## 2019-01-06 DIAGNOSIS — Z823 Family history of stroke: Secondary | ICD-10-CM

## 2019-01-06 DIAGNOSIS — N1832 Chronic kidney disease, stage 3b: Secondary | ICD-10-CM | POA: Diagnosis present

## 2019-01-06 DIAGNOSIS — E785 Hyperlipidemia, unspecified: Secondary | ICD-10-CM | POA: Diagnosis present

## 2019-01-06 DIAGNOSIS — Z885 Allergy status to narcotic agent status: Secondary | ICD-10-CM

## 2019-01-06 DIAGNOSIS — I4891 Unspecified atrial fibrillation: Secondary | ICD-10-CM | POA: Diagnosis not present

## 2019-01-06 DIAGNOSIS — Z9221 Personal history of antineoplastic chemotherapy: Secondary | ICD-10-CM

## 2019-01-06 DIAGNOSIS — Z209 Contact with and (suspected) exposure to unspecified communicable disease: Secondary | ICD-10-CM | POA: Diagnosis not present

## 2019-01-06 DIAGNOSIS — Z888 Allergy status to other drugs, medicaments and biological substances status: Secondary | ICD-10-CM

## 2019-01-06 DIAGNOSIS — J471 Bronchiectasis with (acute) exacerbation: Secondary | ICD-10-CM | POA: Diagnosis not present

## 2019-01-06 DIAGNOSIS — R911 Solitary pulmonary nodule: Secondary | ICD-10-CM | POA: Diagnosis present

## 2019-01-06 DIAGNOSIS — Z881 Allergy status to other antibiotic agents status: Secondary | ICD-10-CM

## 2019-01-06 DIAGNOSIS — Z90710 Acquired absence of both cervix and uterus: Secondary | ICD-10-CM

## 2019-01-06 DIAGNOSIS — K5790 Diverticulosis of intestine, part unspecified, without perforation or abscess without bleeding: Secondary | ICD-10-CM | POA: Diagnosis present

## 2019-01-06 DIAGNOSIS — J441 Chronic obstructive pulmonary disease with (acute) exacerbation: Secondary | ICD-10-CM | POA: Diagnosis present

## 2019-01-06 DIAGNOSIS — A419 Sepsis, unspecified organism: Secondary | ICD-10-CM | POA: Diagnosis present

## 2019-01-06 DIAGNOSIS — Z880 Allergy status to penicillin: Secondary | ICD-10-CM

## 2019-01-06 DIAGNOSIS — I130 Hypertensive heart and chronic kidney disease with heart failure and stage 1 through stage 4 chronic kidney disease, or unspecified chronic kidney disease: Secondary | ICD-10-CM | POA: Diagnosis present

## 2019-01-06 DIAGNOSIS — Z8572 Personal history of non-Hodgkin lymphomas: Secondary | ICD-10-CM

## 2019-01-06 DIAGNOSIS — R0609 Other forms of dyspnea: Secondary | ICD-10-CM | POA: Diagnosis present

## 2019-01-06 DIAGNOSIS — Z8579 Personal history of other malignant neoplasms of lymphoid, hematopoietic and related tissues: Secondary | ICD-10-CM

## 2019-01-06 DIAGNOSIS — I480 Paroxysmal atrial fibrillation: Secondary | ICD-10-CM | POA: Diagnosis not present

## 2019-01-06 DIAGNOSIS — Z6841 Body Mass Index (BMI) 40.0 and over, adult: Secondary | ICD-10-CM

## 2019-01-06 DIAGNOSIS — H04123 Dry eye syndrome of bilateral lacrimal glands: Secondary | ICD-10-CM | POA: Diagnosis not present

## 2019-01-06 DIAGNOSIS — M170 Bilateral primary osteoarthritis of knee: Secondary | ICD-10-CM | POA: Diagnosis present

## 2019-01-06 DIAGNOSIS — Z87891 Personal history of nicotine dependence: Secondary | ICD-10-CM

## 2019-01-06 DIAGNOSIS — R0602 Shortness of breath: Secondary | ICD-10-CM | POA: Diagnosis not present

## 2019-01-06 DIAGNOSIS — J45909 Unspecified asthma, uncomplicated: Secondary | ICD-10-CM | POA: Diagnosis present

## 2019-01-06 DIAGNOSIS — N183 Chronic kidney disease, stage 3 unspecified: Secondary | ICD-10-CM | POA: Diagnosis present

## 2019-01-06 DIAGNOSIS — M1990 Unspecified osteoarthritis, unspecified site: Secondary | ICD-10-CM | POA: Diagnosis present

## 2019-01-06 DIAGNOSIS — Z803 Family history of malignant neoplasm of breast: Secondary | ICD-10-CM

## 2019-01-06 DIAGNOSIS — M47816 Spondylosis without myelopathy or radiculopathy, lumbar region: Secondary | ICD-10-CM | POA: Diagnosis present

## 2019-01-06 DIAGNOSIS — I5031 Acute diastolic (congestive) heart failure: Secondary | ICD-10-CM | POA: Diagnosis present

## 2019-01-06 DIAGNOSIS — I5033 Acute on chronic diastolic (congestive) heart failure: Secondary | ICD-10-CM | POA: Diagnosis present

## 2019-01-06 DIAGNOSIS — R457 State of emotional shock and stress, unspecified: Secondary | ICD-10-CM | POA: Diagnosis not present

## 2019-01-06 DIAGNOSIS — Z882 Allergy status to sulfonamides status: Secondary | ICD-10-CM

## 2019-01-06 DIAGNOSIS — Z66 Do not resuscitate: Secondary | ICD-10-CM | POA: Diagnosis not present

## 2019-01-06 DIAGNOSIS — R509 Fever, unspecified: Secondary | ICD-10-CM

## 2019-01-06 DIAGNOSIS — Z20828 Contact with and (suspected) exposure to other viral communicable diseases: Secondary | ICD-10-CM | POA: Diagnosis present

## 2019-01-06 DIAGNOSIS — Z7902 Long term (current) use of antithrombotics/antiplatelets: Secondary | ICD-10-CM

## 2019-01-06 DIAGNOSIS — Z8249 Family history of ischemic heart disease and other diseases of the circulatory system: Secondary | ICD-10-CM

## 2019-01-06 DIAGNOSIS — G4733 Obstructive sleep apnea (adult) (pediatric): Secondary | ICD-10-CM | POA: Diagnosis present

## 2019-01-06 DIAGNOSIS — J9601 Acute respiratory failure with hypoxia: Secondary | ICD-10-CM | POA: Diagnosis not present

## 2019-01-06 DIAGNOSIS — R0600 Dyspnea, unspecified: Secondary | ICD-10-CM

## 2019-01-06 DIAGNOSIS — K589 Irritable bowel syndrome without diarrhea: Secondary | ICD-10-CM | POA: Diagnosis present

## 2019-01-06 DIAGNOSIS — Z79899 Other long term (current) drug therapy: Secondary | ICD-10-CM

## 2019-01-06 DIAGNOSIS — J9690 Respiratory failure, unspecified, unspecified whether with hypoxia or hypercapnia: Secondary | ICD-10-CM | POA: Diagnosis present

## 2019-01-06 DIAGNOSIS — N179 Acute kidney failure, unspecified: Secondary | ICD-10-CM | POA: Diagnosis present

## 2019-01-06 DIAGNOSIS — Z8673 Personal history of transient ischemic attack (TIA), and cerebral infarction without residual deficits: Secondary | ICD-10-CM

## 2019-01-06 DIAGNOSIS — K219 Gastro-esophageal reflux disease without esophagitis: Secondary | ICD-10-CM | POA: Diagnosis present

## 2019-01-06 DIAGNOSIS — H524 Presbyopia: Secondary | ICD-10-CM | POA: Diagnosis not present

## 2019-01-06 DIAGNOSIS — D649 Anemia, unspecified: Secondary | ICD-10-CM | POA: Diagnosis present

## 2019-01-06 DIAGNOSIS — R0689 Other abnormalities of breathing: Secondary | ICD-10-CM | POA: Diagnosis not present

## 2019-01-06 LAB — COMPREHENSIVE METABOLIC PANEL
ALT: 15 U/L (ref 0–44)
AST: 25 U/L (ref 15–41)
Albumin: 3.3 g/dL — ABNORMAL LOW (ref 3.5–5.0)
Alkaline Phosphatase: 94 U/L (ref 38–126)
Anion gap: 12 (ref 5–15)
BUN: 25 mg/dL — ABNORMAL HIGH (ref 8–23)
CO2: 19 mmol/L — ABNORMAL LOW (ref 22–32)
Calcium: 8.8 mg/dL — ABNORMAL LOW (ref 8.9–10.3)
Chloride: 103 mmol/L (ref 98–111)
Creatinine, Ser: 1.35 mg/dL — ABNORMAL HIGH (ref 0.44–1.00)
GFR calc Af Amer: 41 mL/min — ABNORMAL LOW (ref >60)
GFR calc non Af Amer: 36 mL/min — ABNORMAL LOW (ref >60)
Glucose, Bld: 99 mg/dL (ref 70–99)
Potassium: 4.5 mmol/L (ref 3.5–5.1)
Sodium: 134 mmol/L — ABNORMAL LOW (ref 135–145)
Total Bilirubin: 1 mg/dL (ref 0.3–1.2)
Total Protein: 6.9 g/dL (ref 6.5–8.1)

## 2019-01-06 LAB — CBC WITH DIFFERENTIAL/PLATELET
Abs Immature Granulocytes: 0.01 10*3/uL (ref 0.00–0.07)
Basophils Absolute: 0 10*3/uL (ref 0.0–0.1)
Basophils Relative: 0 %
Eosinophils Absolute: 0.1 10*3/uL (ref 0.0–0.5)
Eosinophils Relative: 1 %
HCT: 28.4 % — ABNORMAL LOW (ref 36.0–46.0)
Hemoglobin: 10.2 g/dL — ABNORMAL LOW (ref 12.0–15.0)
Immature Granulocytes: 0 %
Lymphocytes Relative: 14 %
Lymphs Abs: 1 10*3/uL (ref 0.7–4.0)
MCH: 33 pg (ref 26.0–34.0)
MCHC: 35.9 g/dL (ref 30.0–36.0)
MCV: 91.9 fL (ref 80.0–100.0)
Monocytes Absolute: 0.4 10*3/uL (ref 0.1–1.0)
Monocytes Relative: 6 %
Neutro Abs: 5.6 10*3/uL (ref 1.7–7.7)
Neutrophils Relative %: 79 %
Platelets: 316 10*3/uL (ref 150–400)
RBC: 3.09 MIL/uL — ABNORMAL LOW (ref 3.87–5.11)
RDW: 17.4 % — ABNORMAL HIGH (ref 11.5–15.5)
WBC: 7.1 10*3/uL (ref 4.0–10.5)
nRBC: 0 % (ref 0.0–0.2)

## 2019-01-06 LAB — URINALYSIS, ROUTINE W REFLEX MICROSCOPIC
Bilirubin Urine: NEGATIVE
Glucose, UA: NEGATIVE mg/dL
Hgb urine dipstick: NEGATIVE
Ketones, ur: NEGATIVE mg/dL
Leukocytes,Ua: NEGATIVE
Nitrite: NEGATIVE
Protein, ur: NEGATIVE mg/dL
Specific Gravity, Urine: 1.016 (ref 1.005–1.030)
pH: 5 (ref 5.0–8.0)

## 2019-01-06 LAB — D-DIMER, QUANTITATIVE: D-Dimer, Quant: 4.13 ug/mL-FEU — ABNORMAL HIGH (ref 0.00–0.50)

## 2019-01-06 LAB — TRIGLYCERIDES: Triglycerides: 81 mg/dL (ref <150)

## 2019-01-06 LAB — SARS CORONAVIRUS 2 BY RT PCR (HOSPITAL ORDER, PERFORMED IN ~~LOC~~ HOSPITAL LAB): SARS Coronavirus 2: NEGATIVE

## 2019-01-06 LAB — BRAIN NATRIURETIC PEPTIDE: B Natriuretic Peptide: 190.9 pg/mL — ABNORMAL HIGH (ref 0.0–100.0)

## 2019-01-06 LAB — LACTIC ACID, PLASMA: Lactic Acid, Venous: 1 mmol/L (ref 0.5–1.9)

## 2019-01-06 LAB — FIBRINOGEN: Fibrinogen: 258 mg/dL (ref 210–475)

## 2019-01-06 LAB — PROCALCITONIN: Procalcitonin: 0.29 ng/mL

## 2019-01-06 LAB — TROPONIN I: Troponin I: <0.03 ng/mL (ref <0.03)

## 2019-01-06 LAB — LACTATE DEHYDROGENASE: LDH: 207 U/L — ABNORMAL HIGH (ref 98–192)

## 2019-01-06 MED ORDER — SODIUM CHLORIDE (PF) 0.9 % IJ SOLN
INTRAMUSCULAR | Status: AC
  Filled 2019-01-06: qty 50

## 2019-01-06 MED ORDER — IOHEXOL 350 MG/ML SOLN
80.0000 mL | Freq: Once | INTRAVENOUS | Status: AC | PRN
  Administered 2019-01-07: 80 mL via INTRAVENOUS

## 2019-01-06 NOTE — ED Provider Notes (Signed)
[facility] DEPT Provider Note   CSN: [id number] Arrival date & time: 01/06/19  1805    History   Chief Complaint Chief Complaint  Patient presents with   Fever   Shortness of Breath    HPI Natasha Ellis is a 85 y.o. female patient with a past medical history of Waldenstrom's macroglobulinemia, lymphoplasmacytic lymphoma, TIA, hypertension, hyperlipidemia, who presents today for evaluation of fever and shortness of breath.  She reports that yesterday she was generally feeling unwell and having worsening shortness of breath.  She reports her worsening shortness of breath is been going on for approximately 1 week.  She states that her breathing is to the point where she can no longer walk around in her house without having to stop to catch her breath.  She reports that last night when she went to bed she had a fever of 102.  She took Tylenol.  Overnight she woke up at about midnight with night sweats and took additional Tylenol.  Today she reports that she was still feeling warm and went to her eye doctor and when she came home did not feel well checked her temperature again and it was again 102 she took 2 Tylenol and then came here.  She denies any chest pain.  She states that she does not have significant leg swelling, she has a skin graft on her left lower leg however she says that leg has not had any recent new swelling.  She was tested by her PCP 3 weeks ago and tested negative for coronavirus at that time.  She denies any nausea vomiting or diarrhea.      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   Non-Hodgkin Lymphoma   Right knee DJD    Sleep apnea      Patient Active Problem List   Diagnosis Date Noted   Physical deconditioning 09/15/2018   Bronchiectasis ([place]) 09/06/2018   Lung nodule 09/06/2018   Ascending aortic aneurysm ([place]) 09/06/2018   Hypoxia 08/15/2016   Severe obesity (BMI >= 40) ([place]) 07/30/2015   Recurrent pneumonia 06/19/2015   Anemia 06/10/2015   Anemia due to other cause 06/10/2015   OSA (obstructive sleep apnea) 09/20/2014   History of lymphoma 09/20/2014   History of TIA (transient ischemic attack) 09/20/2014   Diarrhea    Pain in gums 08/15/2014   Neutropenia ([place]) 08/15/2014   Hypersensitivity reaction 05/31/2014   Waldenstrom's macroglobulinemia ([place]) 04/19/2014   HTN (hypertension) 04/29/2013   HLD (hyperlipidemia) 04/29/2013   Lacunar infarction ([place]) 04/29/2013   GERD (gastroesophageal reflux disease) 04/29/2013   TIA (transient ischemic attack) 04/29/2013   Back pain 04/29/2013   Asthma 04/29/2013   Dyspnea 01/25/2013   Chronic cough 01/01/2013    Past Surgical History:  Procedure Laterality Date   ABDOMINAL HYSTERECTOMY  1984   TAH   EYE SURGERY Bilateral    with lens replacement   EYE SURGERY Left    retnia   KNEE ARTHROSCOPY Left    NASAL SINUS SURGERY N/A 12/04/2015   Procedure: ENDOSCOPIC SINUS SURGERY;  Surgeon: Izora Gala, MD;  Location: [facility];  Service: ENT;  Laterality: N/A;   TEE WITHOUT CARDIOVERSION N/A 04/14/2016   Procedure: TRANSESOPHAGEAL ECHOCARDIOGRAM (TEE);  Surgeon: Lelon Perla, MD;  Location: [facility] ENDOSCOPY;  Service: Cardiovascular;  Laterality: N/A;   TONSILLECTOMY AND ADENOIDECTOMY  childhood     OB History    Gravida  0   Para  0   Term  0   Preterm  0   AB  0   Living        SAB  0   TAB  0   Ectopic  0   Multiple      Live Births               Home Medications    Prior to Admission medications   Medication Sig Start Date End Date Taking? Authorizing Provider  acetaminophen (TYLENOL) 500 MG tablet  Take 1,000 mg by mouth every 6 (six) hours as needed for pain.    Yes [provider]  albuterol (PROAIR HFA) 108 (90 Base) MCG/ACT inhaler Inhale 2 puffs into the lungs every 6 (six) hours as needed for wheezing or shortness of breath. 02/14/16  Yes Parrett, Tammy S, NP  albuterol (PROVENTIL) (2.5 MG/3ML) 0.083% nebulizer solution Take 3 mLs (2.5 mg total) by nebulization every 4 (four) hours as needed for wheezing or shortness of breath. 09/22/14  Yes Theodis Blaze, MD  calcium gluconate 500 MG tablet Take 1 tablet by mouth daily.   Yes [provider]  clopidogrel (PLAVIX) 75 MG tablet TAKE 1 TABLET BY MOUTH  DAILY Patient taking differently: Take 75 mg by mouth daily.  05/02/18  Yes Patel, Donika K, DO  ferrous sulfate 325 (65 FE) MG tablet Take 325 mg by mouth daily.    Yes [provider]  furosemide (LASIX) 20 MG tablet Take 20 mg by mouth daily.   Yes [provider]  potassium chloride (K-DUR,KLOR-CON) 10 MEQ tablet Take 10 mEq by mouth daily.  10/11/17  Yes [provider]  Respiratory Therapy Supplies (FLUTTER) DEVI Use as directed. 09/06/18  Yes Parrett, Tammy S, NP  sodium chloride (OCEAN) 0.65 % SOLN nasal spray Place 1 spray into both nostrils 3 (three) times daily as needed for congestion.   Yes [provider]  fluticasone (FLONASE) 50 MCG/ACT nasal spray Place 1 spray into both nostrils daily. Patient not taking: Reported on 01/07/2019 08/29/16   Patrecia Pour, Christean Grief, MD    Family History Family History  Problem Relation Age of Onset   Stroke Father    Heart failure Mother 42   Breast cancer Sister     Social History Social History   Tobacco Use   Smoking status: Former Smoker    Packs/day: 1.00    Years: 25.00    Pack years: 25.00    Types: Cigarettes    Last attempt to quit: 08/04/1979    Years since quitting: 39.4   Smokeless tobacco: Never Used  Substance Use Topics   Alcohol use: No   Drug use: No      Allergies   Codeine; Amoxicillin; Cefdinir; Doxycycline; Erythromycin; Lisinopril; Oxybutynin; and Sulfa antibiotics   Review of Systems Review of Systems  Constitutional: Positive for chills, diaphoresis, fatigue and fever.  Respiratory: Positive for cough and shortness of breath.   Cardiovascular: Negative for chest pain, palpitations and leg swelling.  Gastrointestinal: Negative for abdominal pain, diarrhea, nausea and vomiting.  Musculoskeletal: Negative for back pain, neck pain and neck stiffness.  Neurological: Negative for weakness and  headaches.  Psychiatric/Behavioral: Negative for confusion.  All other systems reviewed and are negative.    Physical Exam Updated Vital Signs BP (!) 154/59    Pulse 81    Temp 99.8 F (37.7 C) (Oral)    Resp (!) 24    Ht 5\' 6"  (1.676 m)    Wt 128.8 kg    SpO2 100%    BMI 45.84 kg/m   Physical Exam Vitals signs and nursing note reviewed.  Constitutional:      Appearance: She is well-developed. She is ill-appearing. She is not diaphoretic.     Comments: Sitting up right in bed.   HENT:     Head: Normocephalic and atraumatic.     Mouth/Throat:     Mouth: Mucous membranes are moist.  Eyes:     General: No scleral icterus.       Right eye: No discharge.        Left eye: No discharge.     Conjunctiva/sclera: Conjunctivae normal.  Neck:     Musculoskeletal: Normal range of motion and neck supple.  Cardiovascular:     Rate and Rhythm: Normal rate and regular rhythm.  Pulmonary:     Effort: Pulmonary effort is normal. No respiratory distress.     Breath sounds: No stridor. Examination of the right-lower field reveals decreased breath sounds. Examination of the left-lower field reveals decreased breath sounds. Decreased breath sounds present.     Comments: When oxygen removed she had increased work of breathing with tachypnea.  Chest:     Chest wall: No tenderness.  Abdominal:     General: There is no distension.     Palpations:  Abdomen is soft.     Tenderness: There is no abdominal tenderness.  Musculoskeletal:        General: No deformity.     Right lower leg: She exhibits no tenderness. No edema.     Comments: LLE in dressing  Skin:    General: Skin is warm and dry.  Neurological:     General: No focal deficit present.     Mental Status: She is alert.     Motor: No abnormal muscle tone.  Psychiatric:        Mood and Affect: Mood normal.        Behavior: Behavior normal.      ED Treatments / Results  Labs (all labs ordered are listed, but only abnormal results are displayed) Labs Reviewed  COMPREHENSIVE METABOLIC PANEL - Abnormal; Notable for the following components:      Result Value   Sodium 134 (*)    CO2 19 (*)    BUN 25 (*)    Creatinine, Ser 1.35 (*)    Calcium 8.8 (*)    Albumin 3.3 (*)    GFR calc non Af Amer 36 (*)    GFR calc Af Amer 41 (*)    All other components within normal limits  CBC WITH DIFFERENTIAL/PLATELET - Abnormal; Notable for the following components:   RBC 3.09 (*)    Hemoglobin 10.2 (*)    HCT 28.4 (*)    RDW 17.4 (*)    All other components within normal limits  BRAIN NATRIURETIC PEPTIDE - Abnormal; Notable for the following components:   B Natriuretic Peptide 190.9 (*)    All other components within normal limits  URINALYSIS, ROUTINE W REFLEX MICROSCOPIC - Abnormal; Notable for the following components:   Color, Urine AMBER (*)    APPearance CLOUDY (*)  All other components within normal limits  D-DIMER, QUANTITATIVE (NOT AT [facility]) - Abnormal; Notable for the following components:   D-Dimer, Quant 4.13 (*)    All other components within normal limits  LACTATE DEHYDROGENASE - Abnormal; Notable for the following components:   LDH 207 (*)    All other components within normal limits  SARS CORONAVIRUS 2 (HOSPITAL ORDER, [facility])  CULTURE, BLOOD (ROUTINE X 2)  CULTURE, BLOOD (ROUTINE X 2)  URINE CULTURE  TROPONIN I  LACTIC  ACID, PLASMA  PROCALCITONIN  TRIGLYCERIDES  FIBRINOGEN  LACTIC ACID, PLASMA  C-REACTIVE PROTEIN  FERRITIN    EKG EKG Interpretation  Date/Time:  Friday January 06 2019 18:50:24 EDT Ventricular Rate:  83 PR Interval:    QRS Duration: 125 QT Interval:  378 QTC Calculation: 445 R Axis:   -12 Text Interpretation:  Sinus rhythm Right bundle branch block Confirmed by Quintella Reichert [phone]) on 01/06/2019 7:25:58 PM   Radiology Ct Angio Chest Pe W/cm &/or Wo Cm  Result Date: 01/07/2019 CLINICAL DATA:  Shortness of breath. EXAM: CT ANGIOGRAPHY CHEST WITH CONTRAST TECHNIQUE: Multidetector CT imaging of the chest was performed using the standard protocol during bolus administration of intravenous contrast. Multiplanar CT image reconstructions and MIPs were obtained to evaluate the vascular anatomy. CONTRAST:  77mL OMNIPAQUE IOHEXOL 350 MG/ML SOLN COMPARISON:  None. FINDINGS: Cardiovascular: Examination for pulmonary emboli is limited by suboptimal contrast bolus timing and significant motion artifact. Given this limitation, no definite PE was identified. The heart size is enlarged. The main pulmonary artery is dilated measuring approximately 3.8 cm. Coronary artery calcifications are noted. Reflux of contrast into the IVC is noted. Mediastinum/Nodes: No enlarged mediastinal, hilar, or axillary lymph nodes. Thyroid gland, trachea, and esophagus demonstrate no significant findings. Lungs/Pleura: There is an apparent 1.7 cm pulmonary nodule at the left lung base, increased in size from prior study. There is scattered areas of atelectasis. The lung parenchyma is not well evaluated secondary to extensive motion artifact. There is no pneumothorax. No large pleural effusion. The trachea is unremarkable. The lung volumes are low. Upper Abdomen: There is likely underlying hepatic steatosis. The spleen is enlarged. Musculoskeletal: No chest wall abnormality. No acute or significant osseous findings. Review of the MIP  images confirms the above findings. IMPRESSION: 1. Very limited study secondary to suboptimal contrast bolus timing and extensive motion artifact. There is no large centrally located pulmonary embolus. Detection of smaller pulmonary emboli is very limited. 2. Cardiomegaly. There is reflux of contrast into the IVC consistent with underlying cardiac dysfunction. 3. The main pulmonary artery is dilated which can be seen in patients with elevated PA pressures. 4. Apparent interval increase in size of a 1.7 cm left lower lobe pulmonary nodule. However, this is not well evaluated secondary to motion artifact. Consider a three-month follow-up CT chest for further evaluation. 5. Probable hepatic steatosis. 6. Probable splenomegaly. Aortic Atherosclerosis (ICD10-I70.0). Electronically Signed   By: [name] M.D.   On: 01/07/2019 01:12   Dg Chest Port 1 View  Result Date: 01/06/2019 CLINICAL DATA:  Shortness of breath and fever x3 weeks. EXAM: PORTABLE CHEST 1 VIEW COMPARISON:  09/12/2018 FINDINGS: The cardiac silhouette is stable. There is no pneumothorax. No large pleural effusion. There may be some mild volume overload. No large focal area of consolidation. There are few airspace opacities at the lung base is favored to represent atelectasis. There is no acute osseous abnormality. IMPRESSION: Enlarged heart with borderline volume overload. No focal  area of consolidation. There are few scattered airspace opacities at the lung base is favored to represent atelectasis. Electronically Signed   By: [name] M.D.   On: 01/06/2019 19:52    Procedures Procedures (including critical care time)  Medications Ordered in ED Medications  sodium chloride (PF) 0.9 % injection (has no administration in time range)  iohexol (OMNIPAQUE) 350 MG/ML injection 80 mL (80 mLs Intravenous Contrast Given 01/07/19 0012)     Initial Impression / Assessment and Plan / ED Course  I have reviewed the triage vital signs  and the nursing notes.  Pertinent labs & imaging results that were available during my care of the patient were reviewed by me and considered in my medical decision making (see chart for details).       Patient presents today for evaluation of shortness of breath and fever.  She is febrile on arrival here.  Labs were obtained and reviewed, she is slightly anemic with a hemoglobin of 10.2 compared to her baseline of 12, her creatinine is 1.35 which is consistent with her baseline, she does not have any other significant electrolyte or hematologic derangements when compared to her baseline.  Chest x-ray was obtained showing enlarged heart, borderline volume overload with no consolidation and concern for bibasilar atelectasis.  Coronavirus testing was obtained which was negative.  Her BNP is very slightly elevated at 190.  Troponin is not elevated.  Her lactic acid is not elevated.  Her d-dimer is elevated at 4.13.  She does have a wound in her left thigh from a skin graft and has been less mobile recently.    Will obtain CTA PE study.   At shift change care was transferred to [facility] who will follow pending studies, re-evaulate and determine disposition.      Final Clinical Impressions(s) / ED Diagnoses   Final diagnoses:  Shortness of breath    ED Discharge Orders    None       Ollen Gross 01/07/19 Marnette Burgess, MD 01/08/19 1248

## 2019-01-06 NOTE — ED Notes (Signed)
Pt could not tolerate ambulating without extreme shortness of breath. Pt was tripoding on edge of bed to catch breath with O2 sat of 94% on 2L [place].   Bedside commode was brought into the room for pt to use with staff assistance.

## 2019-01-06 NOTE — ED Triage Notes (Addendum)
Pt BIB and presents with SOB and fever x three weeks.  Pt was seen by PCP and tested negative for COVID-19.  Pt reports increased SOB and on EMS arrival pt was in a tripod breathing position. Pt has dyspnea on exertion.  Pt's temp was 102 last night.  Pt last took tylenol at 16:00. Pt a/o x 4 and ambulatory with walker.  Pt has a skin graft to the left anterior lower leg. Pt is 96% RA for EMS but was put on 2L [place] for comfort.

## 2019-01-06 NOTE — ED Notes (Signed)
2 unsuccessful attempts by this RN to get an IV.

## 2019-01-06 NOTE — ED Notes (Signed)
Pt transported to CT ?

## 2019-01-07 ENCOUNTER — Observation Stay (HOSPITAL_COMMUNITY): Payer: Medicare Other

## 2019-01-07 DIAGNOSIS — A419 Sepsis, unspecified organism: Secondary | ICD-10-CM | POA: Diagnosis not present

## 2019-01-07 DIAGNOSIS — I4891 Unspecified atrial fibrillation: Secondary | ICD-10-CM | POA: Diagnosis present

## 2019-01-07 DIAGNOSIS — J4520 Mild intermittent asthma, uncomplicated: Secondary | ICD-10-CM

## 2019-01-07 DIAGNOSIS — G4733 Obstructive sleep apnea (adult) (pediatric): Secondary | ICD-10-CM

## 2019-01-07 DIAGNOSIS — R0600 Dyspnea, unspecified: Secondary | ICD-10-CM

## 2019-01-07 DIAGNOSIS — R0609 Other forms of dyspnea: Secondary | ICD-10-CM | POA: Diagnosis present

## 2019-01-07 DIAGNOSIS — R0602 Shortness of breath: Secondary | ICD-10-CM | POA: Diagnosis not present

## 2019-01-07 LAB — CBC
HCT: 27.3 % — ABNORMAL LOW (ref 36.0–46.0)
Hemoglobin: 9.6 g/dL — ABNORMAL LOW (ref 12.0–15.0)
MCH: 32.3 pg (ref 26.0–34.0)
MCHC: 35.2 g/dL (ref 30.0–36.0)
MCV: 91.9 fL (ref 80.0–100.0)
Platelets: 310 10*3/uL (ref 150–400)
RBC: 2.97 MIL/uL — ABNORMAL LOW (ref 3.87–5.11)
RDW: 17.2 % — ABNORMAL HIGH (ref 11.5–15.5)
WBC: 7.7 10*3/uL (ref 4.0–10.5)
nRBC: 0 % (ref 0.0–0.2)

## 2019-01-07 LAB — BASIC METABOLIC PANEL
Anion gap: 9 (ref 5–15)
BUN: 23 mg/dL (ref 8–23)
CO2: 21 mmol/L — ABNORMAL LOW (ref 22–32)
Calcium: 8.4 mg/dL — ABNORMAL LOW (ref 8.9–10.3)
Chloride: 106 mmol/L (ref 98–111)
Creatinine, Ser: 1.32 mg/dL — ABNORMAL HIGH (ref 0.44–1.00)
GFR calc Af Amer: 43 mL/min — ABNORMAL LOW (ref >60)
GFR calc non Af Amer: 37 mL/min — ABNORMAL LOW (ref >60)
Glucose, Bld: 91 mg/dL (ref 70–99)
Potassium: 3.8 mmol/L (ref 3.5–5.1)
Sodium: 136 mmol/L (ref 135–145)

## 2019-01-07 LAB — RETICULOCYTES
Immature Retic Fract: 13.1 % (ref 2.3–15.9)
RBC.: 2.97 MIL/uL — ABNORMAL LOW (ref 3.87–5.11)
Retic Count, Absolute: 33.9 10*3/uL (ref 19.0–186.0)
Retic Ct Pct: 1.1 % (ref 0.4–3.1)

## 2019-01-07 LAB — IRON AND TIBC
Iron: 26 ug/dL — ABNORMAL LOW (ref 28–170)
Saturation Ratios: 14 % (ref 10.4–31.8)
TIBC: 184 ug/dL — ABNORMAL LOW (ref 250–450)
UIBC: 158 ug/dL

## 2019-01-07 LAB — FOLATE: Folate: 72.7 ng/mL (ref >5.9)

## 2019-01-07 LAB — MRSA PCR SCREENING: MRSA by PCR: NEGATIVE

## 2019-01-07 LAB — APTT: aPTT: 32 seconds (ref 24–36)

## 2019-01-07 LAB — PROTIME-INR
INR: 1.5 — ABNORMAL HIGH (ref 0.8–1.2)
Prothrombin Time: 17.5 seconds — ABNORMAL HIGH (ref 11.4–15.2)

## 2019-01-07 LAB — SARS CORONAVIRUS 2 BY RT PCR (HOSPITAL ORDER, PERFORMED IN ~~LOC~~ HOSPITAL LAB): SARS Coronavirus 2: NEGATIVE

## 2019-01-07 LAB — TSH: TSH: 1.201 u[IU]/mL (ref 0.350–4.500)

## 2019-01-07 LAB — FERRITIN: Ferritin: 407 ng/mL — ABNORMAL HIGH (ref 11–307)

## 2019-01-07 LAB — T4, FREE: Free T4: 1.05 ng/dL (ref 0.82–1.77)

## 2019-01-07 LAB — VITAMIN B12: Vitamin B-12: 316 pg/mL (ref 180–914)

## 2019-01-07 LAB — C-REACTIVE PROTEIN: CRP: 22.1 mg/dL — ABNORMAL HIGH (ref <1.0)

## 2019-01-07 LAB — HEPARIN LEVEL (UNFRACTIONATED): Heparin Unfractionated: 0.11 IU/mL — ABNORMAL LOW (ref 0.30–0.70)

## 2019-01-07 MED ORDER — DILTIAZEM LOAD VIA INFUSION
10.0000 mg | Freq: Once | INTRAVENOUS | Status: AC
  Administered 2019-01-07: 10 mg via INTRAVENOUS
  Filled 2019-01-07: qty 10

## 2019-01-07 MED ORDER — DILTIAZEM HCL 100 MG IV SOLR
5.0000 mg/h | INTRAVENOUS | Status: DC
  Administered 2019-01-07: 5 mg/h via INTRAVENOUS
  Filled 2019-01-07: qty 100

## 2019-01-07 MED ORDER — PIPERACILLIN-TAZOBACTAM 3.375 G IVPB: 3.3750 g | Freq: Three times a day (TID) | INTRAVENOUS | Status: DC

## 2019-01-07 MED ORDER — VANCOMYCIN HCL 10 G IV SOLR: 1750.0000 mg | INTRAVENOUS | Status: DC

## 2019-01-07 MED ORDER — DILTIAZEM HCL 60 MG PO TABS
60.0000 mg | ORAL_TABLET | Freq: Four times a day (QID) | ORAL | Status: DC
  Administered 2019-01-07 – 2019-01-08 (×3): 60 mg via ORAL
  Filled 2019-01-07 (×4): qty 1

## 2019-01-07 MED ORDER — FUROSEMIDE 10 MG/ML IJ SOLN
40.0000 mg | Freq: Once | INTRAMUSCULAR | Status: AC
  Administered 2019-01-07: 40 mg via INTRAVENOUS
  Filled 2019-01-07: qty 4

## 2019-01-07 MED ORDER — SODIUM CHLORIDE 0.9 % IV BOLUS
1000.0000 mL | Freq: Once | INTRAVENOUS | Status: AC
  Administered 2019-01-07: 1000 mL via INTRAVENOUS

## 2019-01-07 MED ORDER — HEPARIN BOLUS VIA INFUSION
3000.0000 [IU] | Freq: Once | INTRAVENOUS | Status: AC
  Administered 2019-01-07: 3000 [IU] via INTRAVENOUS
  Filled 2019-01-07: qty 3000

## 2019-01-07 MED ORDER — FERROUS SULFATE 325 (65 FE) MG PO TABS
325.0000 mg | ORAL_TABLET | Freq: Every day | ORAL | Status: DC
  Administered 2019-01-07 – 2019-01-12 (×6): 325 mg via ORAL
  Filled 2019-01-07 (×6): qty 1

## 2019-01-07 MED ORDER — ALBUTEROL SULFATE HFA 108 (90 BASE) MCG/ACT IN AERS
2.0000 | INHALATION_SPRAY | Freq: Four times a day (QID) | RESPIRATORY_TRACT | Status: DC | PRN
  Filled 2019-01-07: qty 6.7

## 2019-01-07 MED ORDER — IPRATROPIUM-ALBUTEROL 20-100 MCG/ACT IN AERS: 1.0000 | INHALATION_SPRAY | Freq: Four times a day (QID) | RESPIRATORY_TRACT | Status: DC

## 2019-01-07 MED ORDER — ONDANSETRON HCL 4 MG/2ML IJ SOLN: 4.0000 mg | Freq: Four times a day (QID) | INTRAMUSCULAR | Status: DC | PRN

## 2019-01-07 MED ORDER — SODIUM CHLORIDE 0.9 % IV SOLN
INTRAVENOUS | Status: DC
  Administered 2019-01-07: 06:00:00 via INTRAVENOUS

## 2019-01-07 MED ORDER — HEPARIN (PORCINE) 25000 UT/250ML-% IV SOLN
1450.0000 [IU]/h | INTRAVENOUS | Status: DC
  Administered 2019-01-07: 1450 [IU]/h via INTRAVENOUS
  Filled 2019-01-07: qty 250

## 2019-01-07 MED ORDER — CALCIUM CARBONATE 1250 (500 CA) MG PO TABS
1250.0000 mg | ORAL_TABLET | Freq: Every day | ORAL | Status: DC
  Administered 2019-01-07 – 2019-01-12 (×6): 1250 mg via ORAL
  Filled 2019-01-07 (×7): qty 1

## 2019-01-07 MED ORDER — IPRATROPIUM-ALBUTEROL 0.5-2.5 (3) MG/3ML IN SOLN
3.0000 mL | Freq: Four times a day (QID) | RESPIRATORY_TRACT | Status: DC
  Administered 2019-01-07 – 2019-01-10 (×12): 3 mL via RESPIRATORY_TRACT
  Filled 2019-01-07 (×13): qty 3

## 2019-01-07 MED ORDER — ACETAMINOPHEN 500 MG PO TABS
1000.0000 mg | ORAL_TABLET | Freq: Four times a day (QID) | ORAL | Status: DC | PRN
  Administered 2019-01-08 (×2): 1000 mg via ORAL
  Filled 2019-01-07 (×2): qty 2

## 2019-01-07 MED ORDER — HEPARIN (PORCINE) 25000 UT/250ML-% IV SOLN
1650.0000 [IU]/h | INTRAVENOUS | Status: DC
  Administered 2019-01-07: 1650 [IU]/h via INTRAVENOUS
  Filled 2019-01-07: qty 250

## 2019-01-07 MED ORDER — CLOPIDOGREL BISULFATE 75 MG PO TABS
75.0000 mg | ORAL_TABLET | Freq: Every day | ORAL | Status: DC
  Administered 2019-01-07 – 2019-01-09 (×3): 75 mg via ORAL
  Filled 2019-01-07 (×4): qty 1

## 2019-01-07 MED ORDER — SALINE SPRAY 0.65 % NA SOLN
1.0000 | Freq: Three times a day (TID) | NASAL | Status: DC | PRN
  Filled 2019-01-07: qty 44

## 2019-01-07 MED ORDER — ACETAMINOPHEN 500 MG PO TABS
1000.0000 mg | ORAL_TABLET | Freq: Once | ORAL | Status: AC
  Administered 2019-01-07: 1000 mg via ORAL
  Filled 2019-01-07: qty 2

## 2019-01-07 MED ORDER — VANCOMYCIN HCL 10 G IV SOLR
2000.0000 mg | Freq: Once | INTRAVENOUS | Status: DC
  Filled 2019-01-07 (×2): qty 2000

## 2019-01-07 NOTE — ED Notes (Signed)
Pt placed on purewick 

## 2019-01-07 NOTE — ED Notes (Signed)
ED TO INPATIENT HANDOFF REPORT  Name/Age/Gender Natasha Ellis 85 y.o. female  Code Status Code Status History    Date Active Date Inactive Code Status Order ID Comments User Context   08/26/2016 2146 08/28/2016 1724 Full Code [id number]  Reubin Milan, MD Inpatient   08/15/2016 2023 08/18/2016 1914 Full Code [id number]  Lily Kocher, MD Inpatient   09/20/2014 0240 09/22/2014 1758 Full Code [id number]  Rise Patience, MD Inpatient   04/29/2013 0103 05/01/2013 2038 Full Code [id number]  Barton Dubois, MD ED      Home/SNF/Other Home  Chief Complaint SOB/Fever  Level of Care/Admitting Diagnosis ED Disposition    ED Disposition Condition [facility] Area: [place] [[id number]]  Level of Care: Stepdown [14]  Admit to SDU based on following criteria: Hemodynamic compromise or significant risk of instability:  Patient requiring short term acute titration and management of vasoactive drips, and invasive monitoring (i.e., CVP and Arterial line).  Covid Evaluation: Person Under Investigation (PUI)  Isolation Risk Level: Low Risk/Droplet (Less than 4L [place] supplementation)  Diagnosis: Sepsis [facility]) [[id number]]  Admitting Physician: Shela Leff [[id number]]  Attending Physician: Shela Leff [[id number]]  PT Class (Do Not Modify): Observation [104]  PT Acc Code (Do Not Modify): Observation [10022]       Medical History Past Medical History:  Diagnosis Date  . Arthritis   . Asthma   . Cancer ([place])    Lymphoma  . Cataract   . Chronic cough   . Diverticulosis of intestine    H/O  . DJD (degenerative joint disease) of lumbar spine   . IBS (irritable bowel syndrome)   . Left knee DJD   . 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 Right knee DJD   . Sleep apnea     Allergies Allergies  Allergen Reactions  . Codeine Other (See Comments)    Crazy-nightmares  . Amoxicillin Diarrhea    Has patient had a PCN  reaction causing immediate rash, facial/tongue/throat swelling, SOB or lightheadedness with hypotension:  No Has patient had a PCN reaction causing severe rash involving mucus membranes or skin necrosis: No Has patient had a PCN reaction that required hospitalization No Has patient had a PCN reaction occurring within the last 10 years: Unknown--rx is diarrhea. If all of the above answers are "NO", then may proceed with Cephalosporin use.    . Cefdinir Cough  . Doxycycline Diarrhea  . Erythromycin Other (See Comments)    GI upset  . Lisinopril Cough  . Oxybutynin Other (See Comments)    Dry mouth  . Sulfa Antibiotics Other (See Comments)    GI upset    IV Location/Drains/Wounds Patient Lines/Drains/Airways Status   Active Line/Drains/Airways    Name:   Placement date:   Placement time:   Site:   Days:   Peripheral IV 01/06/19 Right Hand   01/06/19    1956    Hand   1   Peripheral IV 01/06/19 Left Antecubital   01/06/19    2341    Antecubital   1   Incision (Closed) 12/04/15 Nose   12/04/15    1304     1130          Labs/Imaging Results for orders placed or performed during the hospital encounter of 01/06/19 (from the past 48 hour(s))  Comprehensive metabolic panel     Status: Abnormal   Collection Time: 01/06/19  6:22 PM  Result Value Ref Range  Sodium 134 (L) 135 - 145 mmol/L   Potassium 4.5 3.5 - 5.1 mmol/L   Chloride 103 98 - 111 mmol/L   CO2 19 (L) 22 - 32 mmol/L   Glucose, Bld 99 70 - 99 mg/dL   BUN 25 (H) 8 - 23 mg/dL   Creatinine, Ser 1.35 (H) 0.44 - 1.00 mg/dL   Calcium 8.8 (L) 8.9 - 10.3 mg/dL   Total Protein 6.9 6.5 - 8.1 g/dL   Albumin 3.3 (L) 3.5 - 5.0 g/dL   AST 25 15 - 41 U/L   ALT 15 0 - 44 U/L   Alkaline Phosphatase 94 38 - 126 U/L   Total Bilirubin 1.0 0.3 - 1.2 mg/dL   GFR calc non Af Amer 36 (L) >60 mL/min   GFR calc Af Amer 41 (L) >60 mL/min   Anion gap 12 5 - 15    Comment: Performed at [facility], [place] [address],  [place], [place] [address]  CBC with Differential     Status: Abnormal   Collection Time: 01/06/19  6:22 PM  Result Value Ref Range   WBC 7.1 4.0 - 10.5 K/uL   RBC 3.09 (L) 3.87 - 5.11 MIL/uL   Hemoglobin 10.2 (L) 12.0 - 15.0 g/dL   HCT 28.4 (L) 36.0 - 46.0 %   MCV 91.9 80.0 - 100.0 fL   MCH 33.0 26.0 - 34.0 pg   MCHC 35.9 30.0 - 36.0 g/dL   RDW 17.4 (H) 11.5 - 15.5 %   Platelets 316 150 - 400 K/uL   nRBC 0.0 0.0 - 0.2 %   Neutrophils Relative % 79 %   Neutro Abs 5.6 1.7 - 7.7 K/uL   Lymphocytes Relative 14 %   Lymphs Abs 1.0 0.7 - 4.0 K/uL   Monocytes Relative 6 %   Monocytes Absolute 0.4 0.1 - 1.0 K/uL   Eosinophils Relative 1 %   Eosinophils Absolute 0.1 0.0 - 0.5 K/uL   Basophils Relative 0 %   Basophils Absolute 0.0 0.0 - 0.1 K/uL   Immature Granulocytes 0 %   Abs Immature Granulocytes 0.01 0.00 - 0.07 K/uL    Comment: Performed at [facility] [place] [address] [place], [place] [address]  Brain natriuretic peptide     Status: Abnormal   Collection Time: 01/06/19  6:22 PM  Result Value Ref Range   B Natriuretic Peptide 190.9 (H) 0.0 - 100.0 pg/mL    Comment: Performed at [facility], [place] [address], [place], [place] [address]  Troponin I - Once     Status: None   Collection Time: 01/06/19  6:22 PM  Result Value Ref Range   Troponin I <0.03 <0.03 ng/mL    Comment: Performed at [facility], [place] [address] [place], [place] [address]  SARS Coronavirus 2 (CEPHEID- Performed in [place] hospital lab), Hosp Order     Status: None   Collection Time: 01/06/19  6:23 PM  Result Value Ref Range   SARS Coronavirus 2 NEGATIVE NEGATIVE    Comment: (NOTE) If result is NEGATIVE SARS-CoV-2 target nucleic acids are NOT DETECTED. The SARS-CoV-2 RNA is generally detectable in upper and lower  respiratory specimens during the acute phase of infection. The lowest  concentration of SARS-CoV-2 viral copies this assay can detect is 250   copies / mL. A negative result does not preclude SARS-CoV-2 infection  and should not be used as the Ellis basis for treatment or other  patient  management decisions.  A negative result may occur with  improper specimen collection / handling, submission of specimen other  than nasopharyngeal swab, presence of viral mutation(s) within the  areas targeted by this assay, and inadequate number of viral copies  (<250 copies / mL). A negative result must be combined with clinical  observations, patient history, and epidemiological information. If result is POSITIVE SARS-CoV-2 target nucleic acids are DETECTED. The SARS-CoV-2 RNA is generally detectable in upper and lower  respiratory specimens dur ing the acute phase of infection.  Positive  results are indicative of active infection with SARS-CoV-2.  Clinical  correlation with patient history and other diagnostic information is  necessary to determine patient infection status.  Positive results do  not rule out bacterial infection or co-infection with other viruses. If result is PRESUMPTIVE POSTIVE SARS-CoV-2 nucleic acids MAY BE PRESENT.   A presumptive positive result was obtained on the submitted specimen  and confirmed on repeat testing.  While 2019 novel coronavirus  (SARS-CoV-2) nucleic acids may be present in the submitted sample  additional confirmatory testing may be necessary for epidemiological  and / or clinical management purposes  to differentiate between  SARS-CoV-2 and other Sarbecovirus currently known to infect humans.  If clinically indicated additional testing with an alternate test  methodology [phone]) is advised. The SARS-CoV-2 RNA is generally  detectable in upper and lower respiratory sp ecimens during the acute  phase of infection. The expected result is Negative. Fact Sheet for Patients:  StrictlyIdeas.no Fact Sheet for Healthcare  Providers: BankingDealers.co.za This test is not yet approved or cleared by the [place] FDA and has been authorized for detection and/or diagnosis of SARS-CoV-2 by FDA under an Emergency Use Authorization (EUA).  This EUA will remain in effect (meaning this test can be used) for the duration of the COVID-19 declaration under Section 564(b)(1) of the Act, 21 U.S.C. section 360bbb-3(b)(1), unless the authorization is terminated or revoked sooner. Performed at [facility], [place] [address], [place], [place] [address]   Lactic acid, plasma     Status: None   Collection Time: 01/06/19  6:23 PM  Result Value Ref Range   Lactic Acid, Venous 1.0 0.5 - 1.9 mmol/L    Comment: Performed at [facility], [place] [address], [place],  [address]  Urinalysis, Routine w reflex microscopic     Status: Abnormal   Collection Time: 01/06/19  6:23 PM  Result Value Ref Range   Color, Urine AMBER (A) YELLOW    Comment: BIOCHEMICALS MAY BE AFFECTED BY COLOR   APPearance CLOUDY (A) CLEAR   Specific Gravity, Urine 1.016 1.005 - 1.030   pH 5.0 5.0 - 8.0   Glucose, UA NEGATIVE NEGATIVE mg/dL   Hgb urine dipstick NEGATIVE NEGATIVE   Bilirubin Urine NEGATIVE NEGATIVE   Ketones, ur NEGATIVE NEGATIVE mg/dL   Protein, ur NEGATIVE NEGATIVE mg/dL   Nitrite NEGATIVE NEGATIVE   Leukocytes,Ua NEGATIVE NEGATIVE    Comment: Performed at [place] [address], [place],  [address]  D-dimer, quantitative     Status: Abnormal   Collection Time: 01/06/19  6:41 PM  Result Value Ref Range   D-Dimer, Quant 4.13 (H) 0.00 - 0.50 ug/mL-FEU    Comment: (NOTE) At the manufacturer cut-off of 0.50 ug/mL FEU, this assay has been documented to exclude PE with a sensitivity and negative predictive value of 97 to 99%.  At this time, this assay has not been approved by the FDA  to exclude DVT/VTE. Results should be correlated with  clinical presentation. Performed at [facility], [place] [address] [place], [place] [address]   Procalcitonin     Status: None   Collection Time: 01/06/19  6:41 PM  Result Value Ref Range   Procalcitonin 0.29 ng/mL    Comment:        Interpretation: PCT (Procalcitonin) <= 0.5 ng/mL: Systemic infection (sepsis) is not likely. Local bacterial infection is possible. (NOTE)       Sepsis PCT Algorithm           Lower Respiratory Tract                                      Infection PCT Algorithm    ----------------------------     ----------------------------         PCT < 0.25 ng/mL                PCT < 0.10 ng/mL         Strongly encourage             Strongly discourage   discontinuation of antibiotics    initiation of antibiotics    ----------------------------     -----------------------------       PCT 0.25 - 0.50 ng/mL            PCT 0.10 - 0.25 ng/mL               OR       >80% decrease in PCT            Discourage initiation of                                            antibiotics      Encourage discontinuation           of antibiotics    ----------------------------     -----------------------------         PCT >= 0.50 ng/mL              PCT 0.26 - 0.50 ng/mL               AND        <80% decrease in PCT             Encourage initiation of                                             antibiotics       Encourage continuation           of antibiotics    ----------------------------     -----------------------------        PCT >= 0.50 ng/mL                  PCT > 0.50 ng/mL               AND         increase in PCT                  Strongly encourage  initiation of antibiotics    Strongly encourage escalation           of antibiotics                                     -----------------------------                                           PCT <= 0.25 ng/mL                                                 OR                                         > 80% decrease in PCT                                     Discontinue / Do not initiate                                             antibiotics Performed at [place] [address], [place], [place] [address]   Lactate dehydrogenase     Status: Abnormal   Collection Time: 01/06/19  6:41 PM  Result Value Ref Range   LDH 207 (H) 98 - 192 U/L    Comment: Performed at [facility], [place] [address] [place], [place] [address]  Triglycerides     Status: None   Collection Time: 01/06/19  6:41 PM  Result Value Ref Range   Triglycerides 81 <150 mg/dL    Comment: Performed at [facility], [place] [address] [place], [place] [address]  Fibrinogen     Status: None   Collection Time: 01/06/19  6:41 PM  Result Value Ref Range   Fibrinogen 258 210 - 475 mg/dL    Comment: Performed at [facility], [place] [address], [place], [place] [address]  Protime-INR     Status: Abnormal   Collection Time: 01/07/19  3:01 AM  Result Value Ref Range   Prothrombin Time 17.5 (H) 11.4 - 15.2 seconds   INR 1.5 (H) 0.8 - 1.2    Comment: (NOTE) INR goal varies based on device and disease states. Performed at [facility], [place] [address], [place], [place] [address]   APTT     Status: None   Collection Time: 01/07/19  3:01 AM  Result Value Ref Range   aPTT 32 24 - 36 seconds    Comment: Performed at [facility], [place] [address], [place], [place] [address]   Ct Angio Chest Pe W/cm &/or Wo Cm  Result Date: 01/07/2019 CLINICAL DATA:  Shortness of breath. EXAM: CT ANGIOGRAPHY CHEST WITH CONTRAST TECHNIQUE: Multidetector CT imaging of the chest was performed using the standard protocol during bolus administration of intravenous contrast. Multiplanar CT image reconstructions and MIPs  were obtained to evaluate the vascular anatomy. CONTRAST:  20mL OMNIPAQUE IOHEXOL 350 MG/ML SOLN  COMPARISON:  None. FINDINGS: Cardiovascular: Examination for pulmonary emboli is limited by suboptimal contrast bolus timing and significant motion artifact. Given this limitation, no definite PE was identified. The heart size is enlarged. The main pulmonary artery is dilated measuring approximately 3.8 cm. Coronary artery calcifications are noted. Reflux of contrast into the IVC is noted. Mediastinum/Nodes: No enlarged mediastinal, hilar, or axillary lymph nodes. Thyroid gland, trachea, and esophagus demonstrate no significant findings. Lungs/Pleura: There is an apparent 1.7 cm pulmonary nodule at the left lung base, increased in size from prior study. There is scattered areas of atelectasis. The lung parenchyma is not well evaluated secondary to extensive motion artifact. There is no pneumothorax. No large pleural effusion. The trachea is unremarkable. The lung volumes are low. Upper Abdomen: There is likely underlying hepatic steatosis. The spleen is enlarged. Musculoskeletal: No chest wall abnormality. No acute or significant osseous findings. Review of the MIP images confirms the above findings. IMPRESSION: 1. Very limited study secondary to suboptimal contrast bolus timing and extensive motion artifact. There is no large centrally located pulmonary embolus. Detection of smaller pulmonary emboli is very limited. 2. Cardiomegaly. There is reflux of contrast into the IVC consistent with underlying cardiac dysfunction. 3. The main pulmonary artery is dilated which can be seen in patients with elevated PA pressures. 4. Apparent interval increase in size of a 1.7 cm left lower lobe pulmonary nodule. However, this is not well evaluated secondary to motion artifact. Consider a three-month follow-up CT chest for further evaluation. 5. Probable hepatic steatosis. 6. Probable splenomegaly. Aortic Atherosclerosis (ICD10-I70.0). Electronically Signed   By: [name] M.D.   On: 01/07/2019 01:12   Dg Chest Port 1  View  Result Date: 01/06/2019 CLINICAL DATA:  Shortness of breath and fever x3 weeks. EXAM: PORTABLE CHEST 1 VIEW COMPARISON:  09/12/2018 FINDINGS: The cardiac silhouette is stable. There is no pneumothorax. No large pleural effusion. There may be some mild volume overload. No large focal area of consolidation. There are few airspace opacities at the lung base is favored to represent atelectasis. There is no acute osseous abnormality. IMPRESSION: Enlarged heart with borderline volume overload. No focal area of consolidation. There are few scattered airspace opacities at the lung base is favored to represent atelectasis. Electronically Signed   By: [name] M.D.   On: 01/06/2019 19:52    Pending Labs Unresulted Labs (From admission, onward)    Start     Ordered   01/07/19 1200  Heparin level (unfractionated)  Once-Timed,   STAT     01/07/19 0301   01/07/19 0500  CBC  Daily,   R     01/07/19 0301   01/07/19 0343  SARS Coronavirus 2 (CEPHEID- Performed in [place] hospital lab), Hosp Order  (Symptomatic Patients Labs with Precautions )  ONCE - STAT,   R     01/07/19 0342   01/06/19 2201  Ferritin  Once,   R     01/06/19 2201   01/06/19 2121  C-reactive protein  Once,   R     01/06/19 2121   01/06/19 1824  Urine culture  ONCE - STAT,   STAT    Question:  Patient immune status  Answer:  Immunocompromised   01/06/19 1823   01/06/19 1823  Lactic acid, plasma  Now then every 2 hours,   STAT     01/06/19 1823   01/06/19 1823  Culture, blood (routine x 2)  BLOOD CULTURE X 2,   STAT    Question:  Patient immune status  Answer:  Immunocompromised   01/06/19 1823          Vitals/Pain Today's Vitals   01/07/19 0400 01/07/19 0420 01/07/19 0437 01/07/19 0447  BP: 123/87 115/74    Pulse: (!) 129 (!) 105 (!) 129   Resp: (!) 31 (!) 29 (!) 26   Temp:      TempSrc:      SpO2: 97% 98% 98%   Weight:      Height:      PainSc:    0-No pain    Isolation Precautions Droplet and Contact  precautions  Medications Medications  sodium chloride (PF) 0.9 % injection (has no administration in time range)  diltiazem (CARDIZEM) 1 mg/mL load via infusion 10 mg (10 mg Intravenous Bolus from Bag 01/07/19 0202)    And  diltiazem (CARDIZEM) 100 mg in dextrose 5 % 100 mL (1 mg/mL) infusion (10 mg/hr Intravenous Rate/Dose Change 01/07/19 0349)  heparin ADULT infusion 100 units/mL (25000 units/216mL sodium chloride 0.45%) (1,450 Units/hr Intravenous New Bag/Given 01/07/19 0318)  iohexol (OMNIPAQUE) 350 MG/ML injection 80 mL (80 mLs Intravenous Contrast Given 01/07/19 0012)  acetaminophen (TYLENOL) tablet 1,000 mg (1,000 mg Oral Given 01/07/19 0300)  heparin bolus via infusion 3,000 Units (3,000 Units Intravenous Bolus from Bag 01/07/19 0318)    Mobility walks with device

## 2019-01-07 NOTE — Progress Notes (Signed)
[place] for Heparin Indication: atrial fibrillation and pulmonary embolus  Allergies  Allergen Reactions  . Codeine Other (See Comments)    Crazy-nightmares  . Amoxicillin Diarrhea    Has patient had a PCN reaction causing immediate rash, facial/tongue/throat swelling, SOB or lightheadedness with hypotension:  No Has patient had a PCN reaction causing severe rash involving mucus membranes or skin necrosis: No Has patient had a PCN reaction that required hospitalization No Has patient had a PCN reaction occurring within the last 10 years: Unknown--rx is diarrhea. If all of the above answers are "NO", then may proceed with Cephalosporin use.    . Cefdinir Cough  . Doxycycline Diarrhea  . Erythromycin Other (See Comments)    GI upset  . Lisinopril Cough  . Oxybutynin Other (See Comments)    Dry mouth  . Sulfa Antibiotics Other (See Comments)    GI upset    Patient Measurements: Height: 5\' 6"  (167.6 cm) Weight: 284 lb (128.8 kg) IBW/kg (Calculated) : 59.3 Heparin Dosing Weight: 90.5  Vital Signs: Temp: 98.5 F (36.9 C) (06/06 1249) Temp Source: Oral (06/06 1249) BP: 124/71 (06/06 0900) Pulse Rate: 123 (06/06 0900)  Labs: Recent Labs    01/06/19 1822 01/07/19 0301 01/07/19 0807 01/07/19 1223  HGB 10.2*  --  9.6*  --   HCT 28.4*  --  27.3*  --   PLT 316  --  310  --   APTT  --  32  --   --   LABPROT  --  17.5*  --   --   INR  --  1.5*  --   --   HEPARINUNFRC  --   --   --  0.11*  CREATININE 1.35*  --  1.32*  --   TROPONINI <0.03  --   --   --     Estimated Creatinine Clearance: 42.8 mL/min (A) (by C-G formula based on SCr of 1.32 mg/dL (H)).    Assessment: Patient in ED with [facility], new afib and possible PE.  No oral anticoagulants noted on med rec. Baseline coags ordered.  D-Dimer elevated. 01/07/2019 First heparin level after 3000 unit bolus and heparin drip at 1450 units/hr is low at 0.11.  IV infusing well per RN.  No  bleeding per RN.  CBC stable. SCr 1.32 stable. CT limited study 2nd extensive motion artifact neg for PE.   Goal of Therapy:  Heparin level 0.3-0.7 units/ml Monitor platelets by anticoagulation protocol: Yes   Plan:  Give another Heparin bolus 3000 units iv x1 Increase Heparin drip at 1650 units/hr and check 8 hr HL Daily CBC and heparin level  F/u transition to oral anticoaguant   Eudelia Bunch, Pharm.D 01/07/2019 2:04 PM

## 2019-01-07 NOTE — ED Notes (Signed)
Provider request that pt Cardizem be titrated up to 10 ml/hr.

## 2019-01-07 NOTE — ED Notes (Signed)
Pt c/o itching around R Hand IV site. Infusion paused and moved to Left AC. Pt has not experienced itching at this time.

## 2019-01-07 NOTE — Progress Notes (Signed)
ANTICOAGULATION CONSULT NOTE - Initial Consult  Pharmacy Consult for Heparin Indication: atrial fibrillation and pulmonary embolus  Allergies  Allergen Reactions  . Codeine Other (See Comments)    Crazy-nightmares  . Amoxicillin Diarrhea    Has patient had a PCN reaction causing immediate rash, facial/tongue/throat swelling, SOB or lightheadedness with hypotension:  No Has patient had a PCN reaction causing severe rash involving mucus membranes or skin necrosis: No Has patient had a PCN reaction that required hospitalization No Has patient had a PCN reaction occurring within the last 10 years: Unknown--rx is diarrhea. If all of the above answers are "NO", then may proceed with Cephalosporin use.    . Cefdinir Cough  . Doxycycline Diarrhea  . Erythromycin Other (See Comments)    GI upset  . Lisinopril Cough  . Oxybutynin Other (See Comments)    Dry mouth  . Sulfa Antibiotics Other (See Comments)    GI upset    Patient Measurements: Height: 5\' 6"  (167.6 cm) Weight: 284 lb (128.8 kg) IBW/kg (Calculated) : 59.3 Heparin Dosing Weight:   Vital Signs: Temp: 99.1 F (37.3 C) (06/06 0151) Temp Source: Rectal (06/06 0151) BP: 113/77 (06/06 0240) Pulse Rate: 77 (06/06 0240)  Labs: Recent Labs    01/06/19 1822  HGB 10.2*  HCT 28.4*  PLT 316  CREATININE 1.35*  TROPONINI <0.03    Estimated Creatinine Clearance: 41.9 mL/min (A) (by C-G formula based on SCr of 1.35 mg/dL (H)).   Medical History: Past Medical History:  Diagnosis Date  . Arthritis   . Asthma   . Cancer ([place])    Lymphoma  . Cataract   . Chronic cough   . Diverticulosis of intestine    H/O  . DJD (degenerative joint disease) of lumbar spine   . IBS (irritable bowel syndrome)   . Left knee DJD   . 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 Right knee DJD   . Sleep apnea     Medications:  Infusions:  . diltiazem (CARDIZEM) infusion 7.5 mg/hr (01/07/19 0233)  .  heparin      Assessment: Patient in ED with [facility], new afib and possible PE.  No oral anticoagulants noted on med rec. Baseline coags ordered.  D-Dimer elevated.  Goal of Therapy:  Heparin level 0.3-0.7 units/ml Monitor platelets by anticoagulation protocol: Yes   Plan:  Heparin bolus 3000 units iv x1 Heparin drip at 1450 units/hr Daily CBC Next heparin level at [place], [place] 01/07/2019,3:02 AM

## 2019-01-07 NOTE — Progress Notes (Signed)
Pt has declined use of hospital provided CPAP stating that she does not like our nasal mask.  Pt encourage to wear but continues to refuse.  RT to monitor and assess as needed.

## 2019-01-07 NOTE — H&P (Signed)
History and Physical    Natasha Ellis DPO:[id number] DOB: 03/28/1931 DOA: 01/06/2019  PCP: Lavone Orn, MD Patient coming from: Home  Chief Complaint: Fever, shortness of breath  HPI: Natasha Ellis is a 85 y.o. female with medical history significant of asthma, non-Hodgkin's lymphoma diagnosed in 2004 status post chemo, Waldenstrm's macroglobulinemia, arthritis, OSA on CPAP, colonization with Pseudomonas and bronchiectasis, TIA presenting to the hospital complaining of fever and shortness of breath.  Patient states about a month ago she had a viral illness that lasted 2 weeks.  She was tested for COVID 19 at that time and it was negative.  States her shortness of breath has persisted since then.  States she was seen by cardiology and they did work-up including an echocardiogram and stress test which did not reveal an explanation for her shortness of breath.  States for the past 1 week she has been having fevers.  Her temperature at home yesterday was 74 F.  She has not been coughing much.  Denies any chest pain or palpitations.  Denies any nausea, vomiting, abdominal pain, diarrhea, dysuria, urinary frequency, or urgency.  States she recently had a skin graft done on her left lower extremity and she is supposed to keep it wrapped with bandage.  ED Course: Temperature 100.5.  Patient was initially not tachycardic but later went into A. fib with RVR in the ED with rate up to 160s.  Not hypotensive.  Not hypoxic but placed on 2 L supplemental oxygen for comfort. No leukocytosis.  Lactic acid normal.  Creatinine 1.3, was 1.1-1.2 on recent labs.  BNP 190.  Troponin negative.  COVID-19 rapid test negative.  UA not suggestive of infection.  Urine culture pending.  Blood culture x2 pending.  D-dimer 4.13.  Procalcitonin 0.29.  LDH 207.  Triglycerides normal.  Fibrinogen normal.  Ferritin and CRP pending.  INR 1.5.  LFTs normal.   Chest x-ray showing enlarged heart with borderline volume overload.  No focal  area of consolidation. CTA chest negative for large centrally located PE.  No overt pulmonary edema or consolidation reported. Received Cardizem bolus and started on infusion.  Also heparin infusion.  Review of Systems:  All systems reviewed and apart from history of presenting illness, are negative.  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   Non-Hodgkin Lymphoma   Right knee DJD    Sleep apnea     Past Surgical History:  Procedure Laterality Date   ABDOMINAL HYSTERECTOMY  1984   TAH   EYE SURGERY Bilateral    with lens replacement   EYE SURGERY Left    retnia   KNEE ARTHROSCOPY Left    NASAL SINUS SURGERY N/A 12/04/2015   Procedure: ENDOSCOPIC SINUS SURGERY;  Surgeon: Izora Gala, MD;  Location: [place];  Service: ENT;  Laterality: N/A;   TEE WITHOUT CARDIOVERSION N/A 04/14/2016   Procedure: TRANSESOPHAGEAL ECHOCARDIOGRAM (TEE);  Surgeon: Lelon Perla, MD;  Location: [facility] ENDOSCOPY;  Service: Cardiovascular;  Laterality: N/A;   TONSILLECTOMY AND ADENOIDECTOMY  childhood     reports that she quit smoking about 39 years ago. Her smoking use included cigarettes.  She has a 25.00 pack-year smoking history. She has never used smokeless tobacco. She reports that she does not drink alcohol or use drugs.  Allergies  Allergen Reactions   Codeine Other (See Comments)    Crazy-nightmares   Amoxicillin Diarrhea    Has patient had a PCN reaction causing immediate rash, facial/tongue/throat swelling, SOB or lightheadedness with hypotension:  No Has patient had a PCN reaction causing severe rash involving mucus membranes or skin necrosis: No Has patient had a PCN reaction that required hospitalization  No Has patient had a PCN reaction occurring within the last 10 years: Unknown--rx is diarrhea. If all of the above answers are "NO", then may proceed with Cephalosporin use.     Cefdinir Cough   Doxycycline Diarrhea   Erythromycin Other (See Comments)    GI upset   Lisinopril Cough   Oxybutynin Other (See Comments)    Dry mouth   Sulfa Antibiotics Other (See Comments)    GI upset    Family History  Problem Relation Age of Onset   Stroke Father    Heart failure Mother 96   Breast cancer Sister     Prior to Admission medications   Medication Sig Start Date End Date Taking? Authorizing Provider  acetaminophen (TYLENOL) 500 MG tablet Take 1,000 mg by mouth every 6 (six) hours as needed for pain.    Yes [provider]  albuterol (PROAIR HFA) 108 (90 Base) MCG/ACT inhaler Inhale 2 puffs into the lungs every 6 (six) hours as needed for wheezing or shortness of breath. 02/14/16  Yes Parrett, Tammy S, NP  albuterol (PROVENTIL) (2.5 MG/3ML) 0.083% nebulizer solution Take 3 mLs (2.5 mg total) by nebulization every 4 (four) hours as needed for wheezing or shortness of breath. 09/22/14  Yes Theodis Blaze, MD  calcium gluconate 500 MG tablet Take 1 tablet by mouth daily.   Yes [provider]  clopidogrel (PLAVIX) 75 MG tablet TAKE 1 TABLET BY MOUTH  DAILY Patient taking differently: Take 75 mg by mouth daily.  05/02/18  Yes Patel, Donika K, DO  ferrous sulfate 325 (65 FE) MG tablet Take 325 mg by mouth daily.    Yes [provider]  furosemide (LASIX) 20 MG tablet Take 20 mg by mouth daily.   Yes [provider]  potassium chloride (K-DUR,KLOR-CON) 10 MEQ tablet Take 10 mEq by mouth daily.  10/11/17  Yes [provider]  Respiratory Therapy Supplies (FLUTTER) DEVI Use as directed. 09/06/18  Yes Parrett, Tammy S, NP  sodium chloride (OCEAN) 0.65 % SOLN nasal spray Place 1 spray into both nostrils 3 (three) times daily as needed for congestion.    Yes [provider]  fluticasone (FLONASE) 50 MCG/ACT nasal spray Place 1 spray into both nostrils daily. Patient not taking: Reported on 01/07/2019 08/29/16   Doreatha Lew, MD    Physical Exam: Vitals:   01/07/19 0420 01/07/19 0437 01/07/19 0455 01/07/19 0600  BP: 115/74  117/81 92/76  Pulse: (!) 105 (!) 129 (!) 139 (!) 112  Resp: (!) 29 (!) 26 (!) 30 (!) 24  Temp:      TempSrc:      SpO2: 98% 98% 99% 99%  Weight:      Height:        Physical Exam  Constitutional: She is oriented to person, place, and time. She appears well-developed and well-nourished. No distress.  Appears very lethargic  HENT:  Head: Normocephalic.  Dry mucous membranes  Eyes: Right eye  exhibits no discharge. Left eye exhibits no discharge.  Neck: Neck supple.  Cardiovascular: Intact distal pulses.  Tachycardic Irregularly irregular rhythm  Pulmonary/Chest: Effort normal and breath sounds normal. No respiratory distress. She has no wheezes. She has no rales.  Abdominal: Soft. Bowel sounds are normal. She exhibits no distension. There is no abdominal tenderness. There is no rebound and no guarding.  Musculoskeletal:        General: No edema.     Comments: Left lower extremity wrapped in bandage  Neurological: She is alert and oriented to person, place, and time.  Skin: Skin is warm and dry. She is not diaphoretic.     Labs on Admission: I have personally reviewed following labs and imaging studies  CBC: Recent Labs  Lab 01/06/19 1822  WBC 7.1  NEUTROABS 5.6  HGB 10.2*  HCT 28.4*  MCV 91.9  PLT 295   Basic Metabolic Panel: Recent Labs  Lab 01/06/19 1822  NA 134*  K 4.5  CL 103  CO2 19*  GLUCOSE 99  BUN 25*  CREATININE 1.35*  CALCIUM 8.8*   GFR: Estimated Creatinine Clearance: 41.9 mL/min (A) (by C-G formula based on SCr of 1.35 mg/dL (H)). Liver Function Tests: Recent Labs  Lab 01/06/19 1822  AST 25  ALT 15  ALKPHOS 94  BILITOT 1.0  PROT 6.9  ALBUMIN 3.3*    No results for input(s): LIPASE, AMYLASE in the last 168 hours. No results for input(s): AMMONIA in the last 168 hours. Coagulation Profile: Recent Labs  Lab 01/07/19 0301  INR 1.5*   Cardiac Enzymes: Recent Labs  Lab 01/06/19 1822  TROPONINI <0.03   BNP (last 3 results) Recent Labs    10/12/18 1144 12/30/18 1117  PROBNP 455 673   HbA1C: No results for input(s): HGBA1C in the last 72 hours. CBG: No results for input(s): GLUCAP in the last 168 hours. Lipid Profile: Recent Labs    01/06/19 1841  TRIG 81   Thyroid Function Tests: No results for input(s): TSH, T4TOTAL, FREET4, T3FREE, THYROIDAB in the last 72 hours. Anemia Panel: No results for input(s): VITAMINB12, FOLATE, FERRITIN, TIBC, IRON, RETICCTPCT in the last 72 hours. Urine analysis:    Component Value Date/Time   COLORURINE AMBER (A) 01/06/2019 1823   APPEARANCEUR CLOUDY (A) 01/06/2019 1823   LABSPEC 1.016 01/06/2019 1823   PHURINE 5.0 01/06/2019 1823   GLUCOSEU NEGATIVE 01/06/2019 1823   HGBUR NEGATIVE 01/06/2019 1823   BILIRUBINUR NEGATIVE 01/06/2019 1823   BILIRUBINUR neg 04/20/2013 [place] 01/06/2019 1823   PROTEINUR NEGATIVE 01/06/2019 1823   UROBILINOGEN 0.2 05/21/2015 1920   NITRITE NEGATIVE 01/06/2019 1823   LEUKOCYTESUR NEGATIVE 01/06/2019 1823    Radiological Exams on Admission: Ct Angio Chest Pe W/cm &/or Wo Cm  Result Date: 01/07/2019 CLINICAL DATA:  Shortness of breath. EXAM: CT ANGIOGRAPHY CHEST WITH CONTRAST TECHNIQUE: Multidetector CT imaging of the chest was performed using the standard protocol during bolus administration of intravenous contrast. Multiplanar CT image reconstructions and MIPs were obtained to evaluate the vascular anatomy. CONTRAST:  9mL OMNIPAQUE IOHEXOL 350 MG/ML SOLN COMPARISON:  None. FINDINGS: Cardiovascular: Examination for pulmonary emboli is limited by suboptimal contrast bolus timing and significant motion artifact. Given this limitation,  no definite PE was identified. The heart size is enlarged. The main pulmonary artery is dilated measuring approximately 3.8 cm. Coronary artery calcifications are noted. Reflux of contrast into the IVC is noted. Mediastinum/Nodes: No enlarged mediastinal, hilar, or axillary lymph nodes. Thyroid gland, trachea,  and esophagus demonstrate no significant findings. Lungs/Pleura: There is an apparent 1.7 cm pulmonary nodule at the left lung base, increased in size from prior study. There is scattered areas of atelectasis. The lung parenchyma is not well evaluated secondary to extensive motion artifact. There is no pneumothorax. No large pleural effusion. The trachea is unremarkable. The lung volumes are low. Upper Abdomen: There is likely underlying hepatic steatosis. The spleen is enlarged. Musculoskeletal: No chest wall abnormality. No acute or significant osseous findings. Review of the MIP images confirms the above findings. IMPRESSION: 1. Very limited study secondary to suboptimal contrast bolus timing and extensive motion artifact. There is no large centrally located pulmonary embolus. Detection of smaller pulmonary emboli is very limited. 2. Cardiomegaly. There is reflux of contrast into the IVC consistent with underlying cardiac dysfunction. 3. The main pulmonary artery is dilated which can be seen in patients with elevated PA pressures. 4. Apparent interval increase in size of a 1.7 cm left lower lobe pulmonary nodule. However, this is not well evaluated secondary to motion artifact. Consider a three-month follow-up CT chest for further evaluation. 5. Probable hepatic steatosis. 6. Probable splenomegaly. Aortic Atherosclerosis (ICD10-I70.0). Electronically Signed   By: [name] M.D.   On: 01/07/2019 01:12   Dg Chest Port 1 View  Result Date: 01/06/2019 CLINICAL DATA:  Shortness of breath and fever x3 weeks. EXAM: PORTABLE CHEST 1 VIEW COMPARISON:  09/12/2018 FINDINGS: The cardiac silhouette is  stable. There is no pneumothorax. No large pleural effusion. There may be some mild volume overload. No large focal area of consolidation. There are few airspace opacities at the lung base is favored to represent atelectasis. There is no acute osseous abnormality. IMPRESSION: Enlarged heart with borderline volume overload. No focal area of consolidation. There are few scattered airspace opacities at the lung base is favored to represent atelectasis. Electronically Signed   By: [name] M.D.   On: 01/06/2019 19:52    EKG: Independently reviewed.  Initial EKG showing sinus rhythm and right bundle branch block.  Right bundle branch block seen on previous EKG from January 2018 as well.  Repeat EKG showing A. fib with RVR with heart rate 156.  Assessment/Plan Principal Problem:   Sepsis ([place]) Active Problems:   Asthma   OSA (obstructive sleep apnea)   Atrial fibrillation with RVR (HCC)   Chronic dyspnea  Sepsis Febrile.  Initially not tachycardic but later went into A. fib with RVR in the ED.  Not hypotensive. No leukocytosis.  Lactic acid normal.  Procalcitonin 0.29.  COVID-19 rapid test negative.  However, inflammatory markers including d-dimer and LDH elevated.  Chest imaging not suggestive of pneumonia.  UA not suggestive of infection.  Patient recently had a skin graft to her left lower extremity and it is currently wrapped in bandage.  Unable to examine at this time as patient did not want the bandage to be removed. ?Possible skin source of infection. -IV fluid -Broad-spectrum antibiotics including vancomycin and Zosyn for coverage of bacterial source of infection given elevated procalcitonin. -Blood culture x2 pending -Urine culture pending -Repeat COVID-19 test ordered given fever and no clear explanation. Additional inflammatory markers including ferritin and CRP pending.  New onset A. fib with RVR Likely precipitating factor is underlying infection.  Patient denies any chest  pain or palpitations.  CHA2DS2VASc 6. No valvular abnormality on recent echo done Dec 21, 2018.  Showing moderate LVH, mild left atrial and right atrial dilation, and mild dilation of the ascending aorta. -Continue  Cardizem infusion -Heparin infusion -Check TSH, free T4  Chronic dyspnea Patient is complaining of shortness of breath for the past 1 month.  Followed by pulmonology.  She has a history of bronchiectasis with chronic colonization with Pseudomonas with recurrent cough and chronic shortness of breath.  Patient was recently seen by cardiology and underwent stress test and an echocardiogram.  Stress test was low risk.  Echo with LVEF 60 to 65%.  CTA done today without large centrally located PE, consolidation, or overt pulmonary edema.  Troponin negative.  COVID-19 rapid test negative.  BNP not impressive.  Does not appear volume overloaded on exam.  No wheezing on exam.  Not hypoxic, placed on 2 L supplemental oxygen for comfort. -Continue to monitor -Supplemental oxygen as needed  OSA -CPAP at night  Normocytic anemia -Hemoglobin 10.2, previously normal. -Anemia panel -Continue to monitor CBC  Pulmonary nodule CTA showing apparent interval increase in size of a 1.7 cm left lower lobe pulmonary nodule.  -Patient will need a follow-up CT in 3 months for further evaluation.    Asthma -Stable.  No bronchospasm.  Albuterol PRN.  DVT prophylaxis: Heparin Code Status: Patient wishes to be DNR and DNI. Family Communication: No family available. Disposition Plan: Anticipate discharge after clinical improvement. Consults called: None Admission status: It is my clinical opinion that referral for OBSERVATION is reasonable and necessary in this patient based on the above information provided. The aforementioned taken together are felt to place the patient at high risk for further clinical deterioration. However it is anticipated that the patient may be medically stable for discharge from  the hospital within 24 to 48 hours.  The medical decision making on this patient was of high complexity and the patient is at high risk for clinical deterioration, therefore this is a level 3 visit.  Shela Leff MD [facility] Pager [phone]  If 7PM-7AM, please contact night-coverage [email or web address] Password [facility]  01/07/2019, 6:44 AM

## 2019-01-07 NOTE — ED Notes (Addendum)
Went to check on pt. Pt heart rate was between 150-175. Notified provider and got orders to capture EKG. Pt is experiencing SHOB with 32 breaths per min, however O2 saturation is 98%.  Pt denies dizziness, HA or CP.

## 2019-01-07 NOTE — Progress Notes (Signed)
PROGRESS NOTE    Natasha Ellis  NUU:[id number] DOB: 27-Sep-1930 DOA: 01/06/2019 PCP: Lavone Orn, MD    Brief Narrative:  85 year old female who presented with fever and dyspnea.  She does have significant past medical history for asthma, non-Hodgkin's lymphoma, Waldenstrm macroglobulinemia, bronchiectasis, arthritis and obstructive sleep apnea.  Patient reported 7 days of fever, and persistent dyspnea.  4 weeks ago she had a viral illness which lasted for about 2 weeks, tested negative for COVID-19 back pain, she was left with persistent dyspnea since then.  On her initial initial physical examination her temperature was 100.5, pressure 115/74, heart rate 139, respiratory rate 30, oxygen saturation 98%, she was lethargic, had dry mucous membranes, her lungs had no wheezing or rails, heart S1-S2 present, tachycardic, irregularly irregular, abdomen soft nontender, no lower extremity edema.  Sodium 134, potassium 4.5, chloride 103, bicarb 19, glucose 99, BUN 25, creatinine 1.35, white count 7.1, hemoglobin 10.2, hematocrit 28.4, platelets 316 d-dimer 4.1, fibrinogen 258, INR 1.5.  SARS COVID-19 negative, urinalysis negative for infection.  Chest radiograph with bibasilar atelectasis more right than left.  Chest CT with no pulmonary embolism, no evident infiltrate, positive bronchiectasis. #1 EKG 83 bpm, normal axis, right bundle branch block, sinus rhythm, T wave inversions V1 to V4. #2 EKG 150 bpm, normal axis, right bundle branch block, positive PVC, suspected SVT, persistent T wave inversions in the anterior septal V1 to V4 and new lateral ST depressions, V5 and V6.  Patient was admitted to the hospital working diagnosis of suspected sepsis complicated by atrial fibrillation with rapid ventricular response.   Assessment & Plan:   Principal Problem:   Sepsis ([place]) Active Problems:   Asthma   OSA (obstructive sleep apnea)   Atrial fibrillation with RVR (HCC)   Chronic dyspnea   1. Sepsis.  Tested negative for COVID #2. No clinical signs of deep infection, her left skin graft has dressing in place, changed every Wednesday, last checked with no signs of infection. No leukocytosis and no further fever, will discontinue IV antibiotic for now, will continue close monitoring of cell count, cultures and temperature curve.  2. New onset atrial fibrillation. Patient on diltiazem infusion, personally reviewed telemetry, patient has remained on atrial fibrillation rhythm, rate 100 to 140, continue to have dyspnea. Will add oral diltiazem 60 mg q 6 H, and will continue close telemetry monitoring. No signs of pulmonary edema. For now will dc IV fluids. Recent echocardiogram 05.20.20, with preserved left ventricle EF 60 to 65%, with moderate;y increased LV wall thickness. Continue anticoagulation with heparin.   3. Bronchiectasis. No significant rhonchi on examination, continue oxymetry monitoring and bronchodilator with combivent.   4. Left leg skin graft. Dressing changes once a week, low suspicion for local infection.   5. Obesity. BMI is 45.8.    DVT prophylaxis: heparin   Code Status: full Family Communication: no family at the bedside Disposition Plan/ discharge barriers: pending clinical improvement.   Body mass index is 45.84 kg/m. Malnutrition Type:      Malnutrition Characteristics:      Nutrition Interventions:     RN Pressure Injury Documentation:     Consultants:     Procedures:     Antimicrobials:       Subjective: Patient continue to have dyspnea, no palpitations or chest pain, no nausea or vomiting. Moderate dyspnea, no cough.   Objective: Vitals:   01/07/19 0600 01/07/19 0700 01/07/19 0743 01/07/19 0800  BP: 92/76 (!) 77/47 104/74 108/66  Pulse: (!) 112  Marland Kitchen)  55   Resp: (!) 24 (!) 26 (!) 22 (!) 22  Temp:      TempSrc:      SpO2: 99%  95%   Weight:      Height:        Intake/Output Summary (Last 24 hours) at 01/07/2019 0818 Last data  filed at 01/07/2019 0620 Gross per 24 hour  Intake 0 ml  Output 350 ml  Net -350 ml   Filed Weights   01/06/19 1825  Weight: 128.8 kg    Examination:   General: deconditioned and ill looking appearing  Neurology: Awake and alert, non focal  E ENT: positive pallor, no icterus, oral mucosa moist Cardiovascular: No JVD. S1-S2 present, irregularly irregular with no gallops, rubs, or murmurs. No lower extremity edema. Pulmonary: decreased breath sounds bilaterally, decreased air movement, but no wheezing, rhonchi or rales. Gastrointestinal. Abdomen protuberant with no organomegaly, non tender, no rebound or guarding Skin. Left leg with dressing in place. No local edema or erythema beyond dressing.  Musculoskeletal: no joint deformities     Data Reviewed: I have personally reviewed following labs and imaging studies  CBC: Recent Labs  Lab 01/06/19 1822  WBC 7.1  NEUTROABS 5.6  HGB 10.2*  HCT 28.4*  MCV 91.9  PLT 735   Basic Metabolic Panel: Recent Labs  Lab 01/06/19 1822  NA 134*  K 4.5  CL 103  CO2 19*  GLUCOSE 99  BUN 25*  CREATININE 1.35*  CALCIUM 8.8*   GFR: Estimated Creatinine Clearance: 41.9 mL/min (A) (by C-G formula based on SCr of 1.35 mg/dL (H)). Liver Function Tests: Recent Labs  Lab 01/06/19 1822  AST 25  ALT 15  ALKPHOS 94  BILITOT 1.0  PROT 6.9  ALBUMIN 3.3*   No results for input(s): LIPASE, AMYLASE in the last 168 hours. No results for input(s): AMMONIA in the last 168 hours. Coagulation Profile: Recent Labs  Lab 01/07/19 0301  INR 1.5*   Cardiac Enzymes: Recent Labs  Lab 01/06/19 1822  TROPONINI <0.03   BNP (last 3 results) Recent Labs    10/12/18 1144 12/30/18 1117  PROBNP 455 673   HbA1C: No results for input(s): HGBA1C in the last 72 hours. CBG: No results for input(s): GLUCAP in the last 168 hours. Lipid Profile: Recent Labs    01/06/19 1841  TRIG 81   Thyroid Function Tests: No results for input(s): TSH,  T4TOTAL, FREET4, T3FREE, THYROIDAB in the last 72 hours. Anemia Panel: No results for input(s): VITAMINB12, FOLATE, FERRITIN, TIBC, IRON, RETICCTPCT in the last 72 hours.    Radiology Studies: I have reviewed all of the imaging during this hospital visit personally     Scheduled Meds: . calcium carbonate  1,250 mg Oral Daily  . clopidogrel  75 mg Oral Daily  . ferrous sulfate  325 mg Oral Daily  . sodium chloride (PF)       Continuous Infusions: . sodium chloride 125 mL/hr at 01/07/19 0616  . diltiazem (CARDIZEM) infusion 5 mg/hr (01/07/19 0700)  . heparin 1,450 Units/hr (01/07/19 0318)  . piperacillin-tazobactam (ZOSYN)  IV    . [START ON 01/09/2019] vancomycin    . vancomycin       LOS: 0 days        Mauricio Gerome Apley, MD

## 2019-01-07 NOTE — Plan of Care (Addendum)
Care Plan Updated  E. Watts Therapist, sports

## 2019-01-07 NOTE — Progress Notes (Signed)
Pharmacy Antibiotic Note  Natasha Ellis is a 85 y.o. female admitted on 01/06/2019 with sepsis.  Pharmacy has been consulted for Vancomycin and Zosyn dosing.  Plan: Vancomycin 2gm iv x1, then Vancomycin 1750 mg IV Q 48hrs. Goal AUC 400-550. Expected AUC: 484 SCr used: 1.35  Zosyn 3.375gm iv x1, then Zosyn 3.375g IV Q8H infused over 4hrs.    Height: 5\' 6"  (167.6 cm) Weight: 284 lb (128.8 kg) IBW/kg (Calculated) : 59.3  Temp (24hrs), Avg:99.8 F (37.7 C), Min:99.1 F (37.3 C), Max:100.5 F (38.1 C)  Recent Labs  Lab 01/06/19 1822 01/06/19 1823  WBC 7.1  --   CREATININE 1.35*  --   LATICACIDVEN  --  1.0    Estimated Creatinine Clearance: 41.9 mL/min (A) (by C-G formula based on SCr of 1.35 mg/dL (H)).    Allergies  Allergen Reactions  . Codeine Other (See Comments)    Crazy-nightmares  . Amoxicillin Diarrhea    Has patient had a PCN reaction causing immediate rash, facial/tongue/throat swelling, SOB or lightheadedness with hypotension:  No Has patient had a PCN reaction causing severe rash involving mucus membranes or skin necrosis: No Has patient had a PCN reaction that required hospitalization No Has patient had a PCN reaction occurring within the last 10 years: Unknown--rx is diarrhea. If all of the above answers are "NO", then may proceed with Cephalosporin use.    . Cefdinir Cough  . Doxycycline Diarrhea  . Erythromycin Other (See Comments)    GI upset  . Lisinopril Cough  . Oxybutynin Other (See Comments)    Dry mouth  . Sulfa Antibiotics Other (See Comments)    GI upset    Antimicrobials this admission: Vancomycin 01/07/2019 >> Zosyn 01/07/2019 >>   Dose adjustments this admission: -  Microbiology results: -  Thank you for allowing pharmacy to be a part of this patient's care.  Natasha Ellis 01/07/2019 5:29 AM

## 2019-01-07 NOTE — ED Provider Notes (Signed)
Worsening SOB over the last few days Fever last night and today (on presentation to ED) Increased work of breathing on exertion No home O2 but is more comfortable with O2 here COVID negative D-dimer of 4, pending CTA r/o PE Needs admission for DOE Lives in independent living with aids H/O CA not on chemo or treatment.   CTA negative for visualized clot. Study was degraded by poor bolus timing and motion artifact. No PNA.   The patient developed tachycardia after PE study.  Today's Vitals   01/07/19 0040 01/07/19 0052 01/07/19 0100 01/07/19 0120  BP: (!) 112/47  (!) 154/59 (!) 162/93  Pulse: 83 85 81 (!) 120  Resp: (!) 30 18 (!) 24 15  Temp:      TempSrc:      SpO2: 100% 100% 100% 98%  Weight:      Height:      PainSc:       Body mass index is 45.84 kg/m.  EKG c/w atrial fibrillation. No elevated temperature. O2 saturation 99% on 2 L. Cardizem drip started. Will re-evaluate and plan for admission.  2:15 - Cardizem bolus given with improvement from rate of 200 to 150's. Drip started. Patient appears more comfortable, breathing has slowed down, and she is resting calmly.  2:30 - heart rate still in the 150's. Cardizem at 7.5  2:50 - hospitalist paged for admission. She is felt at risk for PE with current condition, elevated d-dimer, now in atrial fibrillation.   3:30 - heart rate remains 148. BP stable. Will titrate up on Cardizem.   Discussed with the hospitalist who accepts the patient for admission.   CRITICAL CARE Performed by: Dewaine Oats   Total critical care time: 60 minutes  Critical care time was exclusive of separately billable procedures and treating other patients.  Critical care was necessary to treat or prevent imminent or life-threatening deterioration.  Critical care was time spent personally by me on the following activities: development of treatment plan with patient and/or surrogate as well as nursing, discussions with consultants, evaluation of  patient's response to treatment, examination of patient, obtaining history from patient or surrogate, ordering and performing treatments and interventions, ordering and review of laboratory studies, ordering and review of radiographic studies, pulse oximetry and re-evaluation of patient's condition.    Charlann Lange, PA-C 01/07/19 0330    Quintella Reichert, MD 01/07/19 [phone]

## 2019-01-08 DIAGNOSIS — N183 Chronic kidney disease, stage 3 unspecified: Secondary | ICD-10-CM | POA: Diagnosis present

## 2019-01-08 DIAGNOSIS — R0600 Dyspnea, unspecified: Secondary | ICD-10-CM | POA: Diagnosis not present

## 2019-01-08 DIAGNOSIS — R0602 Shortness of breath: Secondary | ICD-10-CM | POA: Diagnosis not present

## 2019-01-08 DIAGNOSIS — Z20828 Contact with and (suspected) exposure to other viral communicable diseases: Secondary | ICD-10-CM | POA: Diagnosis not present

## 2019-01-08 DIAGNOSIS — K589 Irritable bowel syndrome without diarrhea: Secondary | ICD-10-CM | POA: Diagnosis present

## 2019-01-08 DIAGNOSIS — Z743 Need for continuous supervision: Secondary | ICD-10-CM | POA: Diagnosis not present

## 2019-01-08 DIAGNOSIS — J441 Chronic obstructive pulmonary disease with (acute) exacerbation: Secondary | ICD-10-CM | POA: Diagnosis not present

## 2019-01-08 DIAGNOSIS — I509 Heart failure, unspecified: Secondary | ICD-10-CM | POA: Diagnosis not present

## 2019-01-08 DIAGNOSIS — Z6841 Body Mass Index (BMI) 40.0 and over, adult: Secondary | ICD-10-CM | POA: Diagnosis not present

## 2019-01-08 DIAGNOSIS — J9601 Acute respiratory failure with hypoxia: Secondary | ICD-10-CM

## 2019-01-08 DIAGNOSIS — M170 Bilateral primary osteoarthritis of knee: Secondary | ICD-10-CM | POA: Diagnosis present

## 2019-01-08 DIAGNOSIS — I5033 Acute on chronic diastolic (congestive) heart failure: Secondary | ICD-10-CM

## 2019-01-08 DIAGNOSIS — M1990 Unspecified osteoarthritis, unspecified site: Secondary | ICD-10-CM | POA: Diagnosis present

## 2019-01-08 DIAGNOSIS — I4891 Unspecified atrial fibrillation: Secondary | ICD-10-CM | POA: Diagnosis not present

## 2019-01-08 DIAGNOSIS — J9600 Acute respiratory failure, unspecified whether with hypoxia or hypercapnia: Secondary | ICD-10-CM | POA: Diagnosis not present

## 2019-01-08 DIAGNOSIS — N1832 Chronic kidney disease, stage 3b: Secondary | ICD-10-CM | POA: Diagnosis present

## 2019-01-08 DIAGNOSIS — J471 Bronchiectasis with (acute) exacerbation: Secondary | ICD-10-CM | POA: Diagnosis not present

## 2019-01-08 DIAGNOSIS — R911 Solitary pulmonary nodule: Secondary | ICD-10-CM | POA: Diagnosis present

## 2019-01-08 DIAGNOSIS — I480 Paroxysmal atrial fibrillation: Secondary | ICD-10-CM | POA: Diagnosis not present

## 2019-01-08 DIAGNOSIS — Z7902 Long term (current) use of antithrombotics/antiplatelets: Secondary | ICD-10-CM | POA: Diagnosis not present

## 2019-01-08 DIAGNOSIS — Z79899 Other long term (current) drug therapy: Secondary | ICD-10-CM | POA: Diagnosis not present

## 2019-01-08 DIAGNOSIS — Z111 Encounter for screening for respiratory tuberculosis: Secondary | ICD-10-CM | POA: Diagnosis not present

## 2019-01-08 DIAGNOSIS — M6281 Muscle weakness (generalized): Secondary | ICD-10-CM | POA: Diagnosis not present

## 2019-01-08 DIAGNOSIS — R2689 Other abnormalities of gait and mobility: Secondary | ICD-10-CM | POA: Diagnosis not present

## 2019-01-08 DIAGNOSIS — J9691 Respiratory failure, unspecified with hypoxia: Secondary | ICD-10-CM | POA: Diagnosis not present

## 2019-01-08 DIAGNOSIS — I5031 Acute diastolic (congestive) heart failure: Secondary | ICD-10-CM | POA: Diagnosis present

## 2019-01-08 DIAGNOSIS — J449 Chronic obstructive pulmonary disease, unspecified: Secondary | ICD-10-CM | POA: Diagnosis not present

## 2019-01-08 DIAGNOSIS — E785 Hyperlipidemia, unspecified: Secondary | ICD-10-CM | POA: Diagnosis present

## 2019-01-08 DIAGNOSIS — M47816 Spondylosis without myelopathy or radiculopathy, lumbar region: Secondary | ICD-10-CM | POA: Diagnosis present

## 2019-01-08 DIAGNOSIS — I130 Hypertensive heart and chronic kidney disease with heart failure and stage 1 through stage 4 chronic kidney disease, or unspecified chronic kidney disease: Secondary | ICD-10-CM | POA: Diagnosis not present

## 2019-01-08 DIAGNOSIS — K219 Gastro-esophageal reflux disease without esophagitis: Secondary | ICD-10-CM | POA: Diagnosis present

## 2019-01-08 DIAGNOSIS — N179 Acute kidney failure, unspecified: Secondary | ICD-10-CM | POA: Diagnosis not present

## 2019-01-08 DIAGNOSIS — J4520 Mild intermittent asthma, uncomplicated: Secondary | ICD-10-CM | POA: Diagnosis not present

## 2019-01-08 DIAGNOSIS — G4733 Obstructive sleep apnea (adult) (pediatric): Secondary | ICD-10-CM | POA: Diagnosis not present

## 2019-01-08 DIAGNOSIS — D649 Anemia, unspecified: Secondary | ICD-10-CM | POA: Diagnosis present

## 2019-01-08 DIAGNOSIS — R52 Pain, unspecified: Secondary | ICD-10-CM | POA: Diagnosis not present

## 2019-01-08 DIAGNOSIS — K5900 Constipation, unspecified: Secondary | ICD-10-CM | POA: Diagnosis not present

## 2019-01-08 DIAGNOSIS — K5790 Diverticulosis of intestine, part unspecified, without perforation or abscess without bleeding: Secondary | ICD-10-CM | POA: Diagnosis present

## 2019-01-08 DIAGNOSIS — R197 Diarrhea, unspecified: Secondary | ICD-10-CM | POA: Diagnosis not present

## 2019-01-08 DIAGNOSIS — R279 Unspecified lack of coordination: Secondary | ICD-10-CM | POA: Diagnosis not present

## 2019-01-08 DIAGNOSIS — J9690 Respiratory failure, unspecified, unspecified whether with hypoxia or hypercapnia: Secondary | ICD-10-CM | POA: Diagnosis present

## 2019-01-08 DIAGNOSIS — A419 Sepsis, unspecified organism: Secondary | ICD-10-CM | POA: Diagnosis not present

## 2019-01-08 DIAGNOSIS — Z66 Do not resuscitate: Secondary | ICD-10-CM | POA: Diagnosis not present

## 2019-01-08 LAB — BASIC METABOLIC PANEL
Anion gap: 9 (ref 5–15)
BUN: 24 mg/dL — ABNORMAL HIGH (ref 8–23)
CO2: 21 mmol/L — ABNORMAL LOW (ref 22–32)
Calcium: 8.5 mg/dL — ABNORMAL LOW (ref 8.9–10.3)
Chloride: 106 mmol/L (ref 98–111)
Creatinine, Ser: 1.57 mg/dL — ABNORMAL HIGH (ref 0.44–1.00)
GFR calc Af Amer: 34 mL/min — ABNORMAL LOW (ref >60)
GFR calc non Af Amer: 30 mL/min — ABNORMAL LOW (ref >60)
Glucose, Bld: 104 mg/dL — ABNORMAL HIGH (ref 70–99)
Potassium: 4 mmol/L (ref 3.5–5.1)
Sodium: 136 mmol/L (ref 135–145)

## 2019-01-08 LAB — URINE CULTURE: Culture: <10000 — AB

## 2019-01-08 LAB — CBC
HCT: 26 % — ABNORMAL LOW (ref 36.0–46.0)
Hemoglobin: 9.2 g/dL — ABNORMAL LOW (ref 12.0–15.0)
MCH: 32.2 pg (ref 26.0–34.0)
MCHC: 35.4 g/dL (ref 30.0–36.0)
MCV: 90.9 fL (ref 80.0–100.0)
Platelets: 333 10*3/uL (ref 150–400)
RBC: 2.86 MIL/uL — ABNORMAL LOW (ref 3.87–5.11)
RDW: 17.1 % — ABNORMAL HIGH (ref 11.5–15.5)
WBC: 8.4 10*3/uL (ref 4.0–10.5)
nRBC: 0 % (ref 0.0–0.2)

## 2019-01-08 LAB — HEPARIN LEVEL (UNFRACTIONATED): Heparin Unfractionated: 0.12 IU/mL — ABNORMAL LOW (ref 0.30–0.70)

## 2019-01-08 MED ORDER — GUAIFENESIN-DM 100-10 MG/5ML PO SYRP: 10.0000 mL | ORAL_SOLUTION | ORAL | Status: DC | PRN

## 2019-01-08 MED ORDER — APIXABAN 2.5 MG PO TABS
2.5000 mg | ORAL_TABLET | Freq: Two times a day (BID) | ORAL | Status: DC
  Administered 2019-01-08 – 2019-01-10 (×5): 2.5 mg via ORAL
  Filled 2019-01-08 (×5): qty 1

## 2019-01-08 MED ORDER — METOPROLOL TARTRATE 25 MG PO TABS
25.0000 mg | ORAL_TABLET | Freq: Two times a day (BID) | ORAL | Status: DC
  Administered 2019-01-08 – 2019-01-09 (×3): 25 mg via ORAL
  Filled 2019-01-08 (×3): qty 1

## 2019-01-08 MED ORDER — FUROSEMIDE 10 MG/ML IJ SOLN
40.0000 mg | Freq: Once | INTRAMUSCULAR | Status: AC
  Administered 2019-01-08: 40 mg via INTRAVENOUS
  Filled 2019-01-08: qty 4

## 2019-01-08 MED ORDER — HEPARIN (PORCINE) 25000 UT/250ML-% IV SOLN
1850.0000 [IU]/h | INTRAVENOUS | Status: DC
  Administered 2019-01-08 (×2): 1850 [IU]/h via INTRAVENOUS
  Filled 2019-01-08: qty 250

## 2019-01-08 NOTE — Plan of Care (Signed)
°  Problem: Coping: °Goal: Level of anxiety will decrease °Outcome: Progressing °  °

## 2019-01-08 NOTE — Progress Notes (Signed)
[facility] for Heparin >> apixaban Indication: atrial fibrillation and pulmonary embolus  Allergies  Allergen Reactions  . Codeine Other (See Comments)    Crazy-nightmares  . Amoxicillin Diarrhea    Has patient had a PCN reaction causing immediate rash, facial/tongue/throat swelling, SOB or lightheadedness with hypotension:  No Has patient had a PCN reaction causing severe rash involving mucus membranes or skin necrosis: No Has patient had a PCN reaction that required hospitalization No Has patient had a PCN reaction occurring within the last 10 years: Unknown--rx is diarrhea. If all of the above answers are "NO", then may proceed with Cephalosporin use.    . Cefdinir Cough  . Doxycycline Diarrhea  . Erythromycin Other (See Comments)    GI upset  . Lisinopril Cough  . Oxybutynin Other (See Comments)    Dry mouth  . Sulfa Antibiotics Other (See Comments)    GI upset    Patient Measurements: Height: 5\' 6"  (167.6 cm) Weight: 284 lb (128.8 kg) IBW/kg (Calculated) : 59.3 Heparin Dosing Weight: 90.5  Vital Signs: Temp: 97.9 F (36.6 C) (06/07 0359) Temp Source: Oral (06/07 0359) BP: 115/90 (06/07 0700) Pulse Rate: 63 (06/07 0700)  Labs: Recent Labs    01/06/19 1822 01/07/19 0301 01/07/19 0807 01/07/19 1223 01/08/19 0034  HGB 10.2*  --  9.6*  --  9.2*  HCT 28.4*  --  27.3*  --  26.0*  PLT 316  --  310  --  333  APTT  --  32  --   --   --   LABPROT  --  17.5*  --   --   --   INR  --  1.5*  --   --   --   HEPARINUNFRC  --   --   --  0.11* 0.12*  CREATININE 1.35*  --  1.32*  --  1.57*  TROPONINI <0.03  --   --   --   --     Estimated Creatinine Clearance: 36 mL/min (A) (by C-G formula based on SCr of 1.57 mg/dL (H)).    Assessment: Patient in ED with [facility], new afib and possible PE.  No oral anticoagulants noted on med rec. Baseline coags ordered.  D-Dimer elevated.  01/08/2019 Pharmacy consulted to transition heparin to  apixaban for new onset afib. CBC stable. SCr up to 1.57 today. CT limited study 2nd extensive motion artifact neg for PE.  Age > 80, SCr > 1.5, wt 128.8 kg  Goal of Therapy:  Heparin level 0.3-0.7 units/ml Monitor platelets by anticoagulation protocol: Yes   Plan:  Heparin drip stopped Start apixa 2.5 po bid Will probably be able to increase to apixa 5 po bid if SCr drops below 1.5 as previous labs- will order SCr for AM Will provide education and 30 day card closer to discharge date  Eudelia Bunch, Pharm.D 01/08/2019 9:04 AM

## 2019-01-08 NOTE — Discharge Instructions (Addendum)
Atrial Fibrillation ° °Atrial fibrillation is a type of heartbeat that is irregular or fast (rapid). If you have this condition, your heart beats without any order. This makes it hard for your heart to pump blood in a normal way. Having this condition gives you more risk for stroke, heart failure, and other heart problems. °Atrial fibrillation may start all of a sudden and then stop on its own, or it may become a long-lasting problem. °What are the causes? °This condition may be caused by heart conditions, such as: °· High blood pressure. °· Heart failure. °· Heart valve disease. °· Heart surgery. °Other causes include: °· Pneumonia. °· Obstructive sleep apnea. °· Lung cancer. °· Thyroid disease. °· Drinking too much alcohol. °Sometimes the cause is not known. °What increases the risk? °You are more likely to develop this condition if: °· You smoke. °· You are older. °· You have diabetes. °· You are overweight. °· You have a family history of this condition. °· You exercise often and hard. °What are the signs or symptoms? °Common symptoms of this condition include: °· A feeling like your heart is beating very fast. °· Chest pain. °· Feeling short of breath. °· Feeling light-headed or weak. °· Getting tired easily. °Follow these instructions at home: °Medicines °· Take over-the-counter and prescription medicines only as told by your doctor. °· If your doctor gives you a blood-thinning medicine, take it exactly as told. Taking too much of it can cause bleeding. Taking too little of it does not protect you against clots. Clots can cause a stroke. °Lifestyle ° °  ° °· Do not use any tobacco products. These include cigarettes, chewing tobacco, and e-cigarettes. If you need help quitting, ask your doctor. °· Do not drink alcohol. °· Do not drink beverages that have caffeine. These include coffee, soda, and tea. °· Follow diet instructions as told by your doctor. °· Exercise regularly as told by your doctor. °General  instructions °· If you have a condition that causes breathing to stop for a short period of time (apnea), treat it as told by your doctor. °· Keep a healthy weight. Do not use diet pills unless your doctor says they are safe for you. Diet pills may make heart problems worse. °· Keep all follow-up visits as told by your doctor. This is important. °Contact a doctor if: °· You notice a change in the speed, rhythm, or strength of your heartbeat. °· You are taking a blood-thinning medicine and you see more bruising. °· You get tired more easily when you move or exercise. °· You have a sudden change in weight. °Get help right away if: ° °· You have pain in your chest or your belly (abdomen). °· You have trouble breathing. °· You have blood in your vomit, poop, or pee (urine). °· You have any signs of a stroke. "BE FAST" is an easy way to remember the main warning signs: °? B - Balance. Signs are dizziness, sudden trouble walking, or loss of balance. °? E - Eyes. Signs are trouble seeing or a change in how you see. °? F - Face. Signs are sudden weakness or loss of feeling in the face, or the face or eyelid drooping on one side. °? A - Arms. Signs are weakness or loss of feeling in an arm. This happens suddenly and usually on one side of the body. °? S - Speech. Signs are sudden trouble speaking, slurred speech, or trouble understanding what people say. °? T - Time.   Time to call emergency services. Write down what time symptoms started.  You have other signs of a stroke, such as: ? A sudden, very bad headache with no known cause. ? Feeling sick to your stomach (nausea). ? Throwing up (vomiting). ? Jerky movements you cannot control (seizure). These symptoms may be an emergency. Do not wait to see if the symptoms will go away. Get medical help right away. Call your local emergency services (911 in the [place] Do not drive yourself to the hospital. Summary  Atrial fibrillation is a type of heartbeat that is irregular  or fast (rapid).  You are at higher risk of this condition if you smoke, are older, have diabetes, or are overweight.  Follow your doctor's instructions about medicines, diet, exercise, and follow-up visits.  Get help right away if you think that you have signs of a stroke. This information is not intended to replace advice given to you by your health care provider. Make sure you discuss any questions you have with your health care provider. Document Released: 04/28/2008 Document Revised: 09/10/2017 Document Reviewed: 09/10/2017 Elsevier Interactive Patient Education  2019 [place] on my medicine - ELIQUIS (apixaban)  This medication education was reviewed with me or my healthcare representative as part of my discharge preparation.   Why was Eliquis prescribed for you? Eliquis was prescribed for you to reduce the risk of a blood clot forming that can cause a stroke if you have a medical condition called atrial fibrillation (a type of irregular heartbeat).  What do You need to know about Eliquis ? Take your Eliquis TWICE DAILY - one tablet in the morning and one tablet in the evening with or without food. If you have difficulty swallowing the tablet whole please discuss with your pharmacist how to take the medication safely.  Take Eliquis exactly as prescribed by your doctor and DO NOT stop taking Eliquis without talking to the doctor who prescribed the medication.  Stopping may increase your risk of developing a stroke.  Refill your prescription before you run out.  After discharge, you should have regular check-up appointments with your healthcare provider that is prescribing your Eliquis.  In the future your dose may need to be changed if your kidney function or weight changes by a significant amount or as you get older.  What do you do if you miss a dose? If you miss a dose, take it as soon as you remember on the same day and resume taking twice daily.  Do not  take more than one dose of ELIQUIS at the same time to make up a missed dose.  Important Safety Information A possible side effect of Eliquis is bleeding. You should call your healthcare provider right away if you experience any of the following: ? Bleeding from an injury or your nose that does not stop. ? Unusual colored urine (red or dark brown) or unusual colored stools (red or black). ? Unusual bruising for unknown reasons. ? A serious fall or if you hit your head (even if there is no bleeding).  Some medicines may interact with Eliquis and might increase your risk of bleeding or clotting while on Eliquis. To help avoid this, consult your healthcare provider or pharmacist prior to using any new prescription or non-prescription medications, including herbals, vitamins, non-steroidal anti-inflammatory drugs (NSAIDs) and supplements.  This website has more information on Eliquis (apixaban): http://www.eliquis.com/eliquis/home

## 2019-01-08 NOTE — Progress Notes (Signed)
ANTICOAGULATION CONSULT NOTE - Follow Up Consult  Pharmacy Consult for Heparin Indication: atrial fibrillation and pulmonary embolus  Allergies  Allergen Reactions  . Codeine Other (See Comments)    Crazy-nightmares  . Amoxicillin Diarrhea    Has patient had a PCN reaction causing immediate rash, facial/tongue/throat swelling, SOB or lightheadedness with hypotension:  No Has patient had a PCN reaction causing severe rash involving mucus membranes or skin necrosis: No Has patient had a PCN reaction that required hospitalization No Has patient had a PCN reaction occurring within the last 10 years: Unknown--rx is diarrhea. If all of the above answers are "NO", then may proceed with Cephalosporin use.    . Cefdinir Cough  . Doxycycline Diarrhea  . Erythromycin Other (See Comments)    GI upset  . Lisinopril Cough  . Oxybutynin Other (See Comments)    Dry mouth  . Sulfa Antibiotics Other (See Comments)    GI upset    Patient Measurements: Height: 5\' 6"  (167.6 cm) Weight: 284 lb (128.8 kg) IBW/kg (Calculated) : 59.3 Heparin Dosing Weight:   Vital Signs: Temp: 100.9 F (38.3 C) (06/06 2351) Temp Source: Oral (06/06 2351) BP: 132/105 (06/06 2100) Pulse Rate: 78 (06/06 2100)  Labs: Recent Labs    01/06/19 1822 01/07/19 0301 01/07/19 0807 01/07/19 1223 01/08/19 0034  HGB 10.2*  --  9.6*  --  9.2*  HCT 28.4*  --  27.3*  --  26.0*  PLT 316  --  310  --  333  APTT  --  32  --   --   --   LABPROT  --  17.5*  --   --   --   INR  --  1.5*  --   --   --   HEPARINUNFRC  --   --   --  0.11* 0.12*  CREATININE 1.35*  --  1.32*  --  1.57*  TROPONINI <0.03  --   --   --   --     Estimated Creatinine Clearance: 36 mL/min (A) (by C-G formula based on SCr of 1.57 mg/dL (H)).   Medications:  Infusions:  . diltiazem (CARDIZEM) infusion Stopped (01/07/19 1200)  . heparin 1,650 Units/hr (01/07/19 2200)  . heparin      Assessment: Patient with low heparin level.  No heparin  issues per RN.  Goal of Therapy:  Heparin level 0.3-0.7 units/ml Monitor platelets by anticoagulation protocol: Yes   Plan:  Increase heparin to 1850 units/hr Recheck heparin level at [place] 01/08/2019,2:54 AM

## 2019-01-08 NOTE — Progress Notes (Addendum)
PROGRESS NOTE    Natasha Ellis  NTZ:[id number] DOB: 22-Nov-1930 DOA: 01/06/2019 PCP: Lavone Orn, MD    Brief Narrative:  85 year old female who presented with fever and dyspnea.  She does have significant past medical history for asthma, non-Hodgkin's lymphoma, [place] macroglobulinemia, bronchiectasis, arthritis and obstructive sleep apnea.  Patient reported 7 days of fever, and persistent dyspnea.  4 weeks ago she had a viral illness which lasted for about 2 weeks, tested negative for COVID-19 back pain, she was left with persistent dyspnea since then.  On her initial initial physical examination her temperature was 100.5, pressure 115/74, heart rate 139, respiratory rate 30, oxygen saturation 98%, she was lethargic, had dry mucous membranes, her lungs had no wheezing or rails, heart S1-S2 present, tachycardic, irregularly irregular, abdomen soft nontender, no lower extremity edema.  Sodium 134, potassium 4.5, chloride 103, bicarb 19, glucose 99, BUN 25, creatinine 1.35, white count 7.1, hemoglobin 10.2, hematocrit 28.4, platelets 316 d-dimer 4.1, fibrinogen 258, INR 1.5.  SARS COVID-19 negative, urinalysis negative for infection.  Chest radiograph with bibasilar atelectasis more right than left.  Chest CT with no pulmonary embolism, no evident infiltrate, positive bronchiectasis. #1 EKG 83 bpm, normal axis, right bundle branch block, sinus rhythm, T wave inversions V1 to V4. #2 EKG 150 bpm, normal axis, right bundle branch block, positive PVC, suspected SVT, persistent T wave inversions in the anterior septal V1 to V4 and new lateral ST depressions, V5 and V6.  Patient was admitted to the hospital working diagnosis of suspected sepsis complicated by atrial fibrillation with rapid ventricular response.    Assessment & Plan:   Principal Problem:   Atrial fibrillation with RVR (HCC) Active Problems:   Asthma   OSA (obstructive sleep apnea)   Chronic dyspnea   Acute diastolic CHF  (congestive heart failure) (HCC)   Respiratory failure (HCC) wit hypoxia   CKD (chronic kidney disease) stage 3, GFR 30-59 ml/min (HCC)   AKI (acute kidney injury) ([place])   1. Sepsis/ ruled out. Patient now off antibiotic therapy, her wbc is 8.4 with no further fever. Repeat chest film with no pneumonic infiltrate. Will continue to monitor cell count and temperature curve.   2. New onset atrial fibrillation complicated with acute on chronic diastolic heart failure, acute hypoxic respiratory failure and cardiogenic pulmonary edema. Converted to sinus rhythm, HR in the 60's, will change diltiazem to low dose metoprolol to prevent further bradycardia. Check 12 lead ekg, to document sinus rhythm. Persistent hypervolemia, her urine output has been 1,300 ml but only negative 131 ml of net fluid balance. Will give another dose of IV furosemide today. Continue supplemental 02 per [place] target oxygen saturation greater than 92%. Continue anticoagulation with apixaban.   3. Bronchiectasis. Continue combivent, patient does have wheezing this am, will continue oxymetry monitoring. No clinical signs of exacerbation.   4. Left leg skin graft. Continue with dressing changes once a week.   5. Obesity. BMI is 45.8.    6. New AKI on CKD stage 3b. Serum cr at 1,57 today up from 1,32 yesterday, baseline 2,5. K is 4,0 and serum bicarbonate is 4,0.  Will continue diuresis for now with IV furosemide, follow renal function in am.   DVT prophylaxis: heparin   Code Status: full Family Communication: no family at the bedside Disposition Plan/ discharge barriers: pending clinical improvement.    Body mass index is 45.84 kg/m. Malnutrition Type:      Malnutrition Characteristics:      Nutrition Interventions:  RN Pressure Injury Documentation:     Consultants:     Procedures:     Antimicrobials:       Subjective: Patient has converted to sinus rhythm but continue to have dyspnea and  chest pressure, no nausea or vomiting, no frank chest pain. Generalized weakness.   Objective: Vitals:   01/08/19 0300 01/08/19 0359 01/08/19 0600 01/08/19 0700  BP: (!) 123/40  (!) 97/37 115/90  Pulse: 66  (!) 55 63  Resp: (!) 21  (!) 21 (!) 21  Temp:  97.9 F (36.6 C)    TempSrc:  Oral    SpO2: 99%  100% 100%  Weight:      Height:        Intake/Output Summary (Last 24 hours) at 01/08/2019 0818 Last data filed at 01/08/2019 0800 Gross per 24 hour  Intake 1518.16 ml  Output 1300 ml  Net 218.16 ml   Filed Weights   01/06/19 1825  Weight: 128.8 kg    Examination:   General: deconditioned and ill looking appearing Neurology: Awake and alert, non focal  E ENT: positive pallor, no icterus, oral mucosa moist Cardiovascular: No JVD. S1-S2 present, rhythmic, no gallops, rubs, or murmurs. trace lower extremity edema. Pulmonary: positive breath sounds bilaterally, decreased air movement, positive scattered expiratory wheezing, but no rhonchi or rales. Gastrointestinal. Abdomen protuberant no organomegaly, non tender, no rebound or guarding Skin. No rashes/ right leg with dressing in place, skin graft.  Musculoskeletal: no joint deformities     Data Reviewed: I have personally reviewed following labs and imaging studies  CBC: Recent Labs  Lab 01/06/19 1822 01/07/19 0807 01/08/19 0034  WBC 7.1 7.7 8.4  NEUTROABS 5.6  --   --   HGB 10.2* 9.6* 9.2*  HCT 28.4* 27.3* 26.0*  MCV 91.9 91.9 90.9  PLT 316 310 102   Basic Metabolic Panel: Recent Labs  Lab 01/06/19 1822 01/07/19 0807 01/08/19 0034  NA 134* 136 136  K 4.5 3.8 4.0  CL 103 106 106  CO2 19* 21* 21*  GLUCOSE 99 91 104*  BUN 25* 23 24*  CREATININE 1.35* 1.32* 1.57*  CALCIUM 8.8* 8.4* 8.5*   GFR: Estimated Creatinine Clearance: 36 mL/min (A) (by C-G formula based on SCr of 1.57 mg/dL (H)). Liver Function Tests: Recent Labs  Lab 01/06/19 1822  AST 25  ALT 15  ALKPHOS 94  BILITOT 1.0  PROT 6.9  ALBUMIN  3.3*   No results for input(s): LIPASE, AMYLASE in the last 168 hours. No results for input(s): AMMONIA in the last 168 hours. Coagulation Profile: Recent Labs  Lab 01/07/19 0301  INR 1.5*   Cardiac Enzymes: Recent Labs  Lab 01/06/19 1822  TROPONINI <0.03   BNP (last 3 results) Recent Labs    10/12/18 1144 12/30/18 1117  PROBNP 455 673   HbA1C: No results for input(s): HGBA1C in the last 72 hours. CBG: No results for input(s): GLUCAP in the last 168 hours. Lipid Profile: Recent Labs    01/06/19 1841  TRIG 81   Thyroid Function Tests: Recent Labs    01/07/19 0807  TSH 1.201  FREET4 1.05   Anemia Panel: Recent Labs    01/07/19 0807  VITAMINB12 316  FOLATE 72.7  FERRITIN 407*  TIBC 184*  IRON 26*  RETICCTPCT 1.1      Radiology Studies: I have reviewed all of the imaging during this hospital visit personally     Scheduled Meds: . calcium carbonate  1,250 mg Oral Daily  .  clopidogrel  75 mg Oral Daily  . diltiazem  60 mg Oral Q6H  . ferrous sulfate  325 mg Oral Daily  . ipratropium-albuterol  3 mL Nebulization Q6H   Continuous Infusions: . diltiazem (CARDIZEM) infusion Stopped (01/07/19 1200)  . heparin 1,650 Units/hr (01/07/19 2242)  . heparin 1,850 Units/hr (01/08/19 0148)     LOS: 0 days        Klair Leising Gerome Apley, MD

## 2019-01-09 ENCOUNTER — Inpatient Hospital Stay (HOSPITAL_COMMUNITY): Payer: Medicare Other

## 2019-01-09 DIAGNOSIS — I5031 Acute diastolic (congestive) heart failure: Secondary | ICD-10-CM

## 2019-01-09 DIAGNOSIS — N183 Chronic kidney disease, stage 3 (moderate): Secondary | ICD-10-CM

## 2019-01-09 DIAGNOSIS — J441 Chronic obstructive pulmonary disease with (acute) exacerbation: Secondary | ICD-10-CM

## 2019-01-09 DIAGNOSIS — N179 Acute kidney failure, unspecified: Secondary | ICD-10-CM

## 2019-01-09 LAB — BASIC METABOLIC PANEL
Anion gap: 10 (ref 5–15)
BUN: 32 mg/dL — ABNORMAL HIGH (ref 8–23)
CO2: 21 mmol/L — ABNORMAL LOW (ref 22–32)
Calcium: 8.4 mg/dL — ABNORMAL LOW (ref 8.9–10.3)
Chloride: 106 mmol/L (ref 98–111)
Creatinine, Ser: 1.66 mg/dL — ABNORMAL HIGH (ref 0.44–1.00)
GFR calc Af Amer: 32 mL/min — ABNORMAL LOW (ref >60)
GFR calc non Af Amer: 28 mL/min — ABNORMAL LOW (ref >60)
Glucose, Bld: 90 mg/dL (ref 70–99)
Potassium: 4.1 mmol/L (ref 3.5–5.1)
Sodium: 137 mmol/L (ref 135–145)

## 2019-01-09 LAB — CBC WITH DIFFERENTIAL/PLATELET
Abs Immature Granulocytes: 0.04 10*3/uL (ref 0.00–0.07)
Basophils Absolute: 0 10*3/uL (ref 0.0–0.1)
Basophils Relative: 0 %
Eosinophils Absolute: 0.2 10*3/uL (ref 0.0–0.5)
Eosinophils Relative: 3 %
HCT: 26.2 % — ABNORMAL LOW (ref 36.0–46.0)
Hemoglobin: 9.2 g/dL — ABNORMAL LOW (ref 12.0–15.0)
Immature Granulocytes: 1 %
Lymphocytes Relative: 19 %
Lymphs Abs: 1.1 10*3/uL (ref 0.7–4.0)
MCH: 32.4 pg (ref 26.0–34.0)
MCHC: 35.1 g/dL (ref 30.0–36.0)
MCV: 92.3 fL (ref 80.0–100.0)
Monocytes Absolute: 0.3 10*3/uL (ref 0.1–1.0)
Monocytes Relative: 5 %
Neutro Abs: 4.2 10*3/uL (ref 1.7–7.7)
Neutrophils Relative %: 72 %
Platelets: 333 10*3/uL (ref 150–400)
RBC: 2.84 MIL/uL — ABNORMAL LOW (ref 3.87–5.11)
RDW: 17.1 % — ABNORMAL HIGH (ref 11.5–15.5)
WBC: 5.7 10*3/uL (ref 4.0–10.5)
nRBC: 0 % (ref 0.0–0.2)

## 2019-01-09 LAB — GLUCOSE, CAPILLARY: Glucose-Capillary: 171 mg/dL — ABNORMAL HIGH (ref 70–99)

## 2019-01-09 MED ORDER — IPRATROPIUM-ALBUTEROL 0.5-2.5 (3) MG/3ML IN SOLN: 3.0000 mL | RESPIRATORY_TRACT | Status: DC | PRN

## 2019-01-09 MED ORDER — ACETIC ACID 0.25 % IR SOLN
Status: DC
  Administered 2019-01-11: 17:00:00
  Filled 2019-01-09: qty 1000

## 2019-01-09 MED ORDER — GUAIFENESIN-DM 100-10 MG/5ML PO SYRP
10.0000 mL | ORAL_SOLUTION | ORAL | Status: DC
  Administered 2019-01-09 – 2019-01-12 (×8): 10 mL via ORAL
  Filled 2019-01-09 (×13): qty 10

## 2019-01-09 MED ORDER — DILTIAZEM HCL ER 60 MG PO CP12
60.0000 mg | ORAL_CAPSULE | Freq: Once | ORAL | Status: AC
  Administered 2019-01-09: 60 mg via ORAL
  Filled 2019-01-09: qty 1

## 2019-01-09 MED ORDER — DILTIAZEM HCL ER COATED BEADS 180 MG PO CP24
180.0000 mg | ORAL_CAPSULE | Freq: Every day | ORAL | Status: DC
  Administered 2019-01-09: 180 mg via ORAL
  Filled 2019-01-09: qty 1

## 2019-01-09 MED ORDER — FLUTICASONE FUROATE-VILANTEROL 200-25 MCG/INH IN AEPB
1.0000 | INHALATION_SPRAY | Freq: Every day | RESPIRATORY_TRACT | Status: DC
  Administered 2019-01-09 – 2019-01-11 (×3): 1 via RESPIRATORY_TRACT
  Filled 2019-01-09: qty 28

## 2019-01-09 MED ORDER — METOPROLOL TARTRATE 5 MG/5ML IV SOLN
5.0000 mg | INTRAVENOUS | Status: DC | PRN
  Administered 2019-01-09 (×2): 5 mg via INTRAVENOUS
  Filled 2019-01-09 (×2): qty 5

## 2019-01-09 MED ORDER — ORAL CARE MOUTH RINSE
15.0000 mL | Freq: Two times a day (BID) | OROMUCOSAL | Status: DC
  Administered 2019-01-09 – 2019-01-12 (×6): 15 mL via OROMUCOSAL

## 2019-01-09 MED ORDER — ACETIC ACID 0.25 % IR SOLN
Freq: Every day | Status: DC
  Filled 2019-01-09: qty 1000

## 2019-01-09 MED ORDER — DILTIAZEM HCL ER COATED BEADS 180 MG PO CP24: 180.0000 mg | ORAL_CAPSULE | Freq: Every day | ORAL | Status: DC

## 2019-01-09 MED ORDER — PREDNISONE 20 MG PO TABS
40.0000 mg | ORAL_TABLET | Freq: Every day | ORAL | Status: DC
  Administered 2019-01-09 – 2019-01-10 (×2): 40 mg via ORAL
  Filled 2019-01-09 (×2): qty 2

## 2019-01-09 MED ORDER — CHLORHEXIDINE GLUCONATE CLOTH 2 % EX PADS
6.0000 | MEDICATED_PAD | Freq: Every day | CUTANEOUS | Status: DC
  Administered 2019-01-09 – 2019-01-11 (×3): 6 via TOPICAL

## 2019-01-09 MED ORDER — METOPROLOL TARTRATE 5 MG/5ML IV SOLN
5.0000 mg | INTRAVENOUS | Status: DC | PRN
  Administered 2019-01-09 – 2019-01-10 (×3): 5 mg via INTRAVENOUS
  Filled 2019-01-09 (×4): qty 5

## 2019-01-09 MED ORDER — METOPROLOL TARTRATE 5 MG/5ML IV SOLN: 5.0000 mg | Freq: Two times a day (BID) | INTRAVENOUS | Status: DC | PRN

## 2019-01-09 MED ORDER — DILTIAZEM HCL ER COATED BEADS 240 MG PO CP24
240.0000 mg | ORAL_CAPSULE | Freq: Every day | ORAL | Status: DC
  Administered 2019-01-10 – 2019-01-12 (×3): 240 mg via ORAL
  Filled 2019-01-09: qty 1
  Filled 2019-01-09 (×3): qty 2

## 2019-01-09 NOTE — Progress Notes (Signed)
PT Cancellation Note  Patient Details Name: Natasha Ellis MRN: [id number] DOB: 1930-11-16   Cancelled Treatment:    Reason Eval/Treat Not Completed: Medical issues which prohibited therapy--elevated HR. Will hold PT today.    Weston Anna, PT Acute Rehabilitation Services Pager: [phone] Office: [phone]

## 2019-01-09 NOTE — Progress Notes (Signed)
Patient declines use of CPAP at this time.

## 2019-01-09 NOTE — Progress Notes (Signed)
PROGRESS NOTE    Natasha Ellis  KCL:[id number] DOB: [birth year] DOA: 01/06/2019 PCP: Lavone Orn, MD    Brief Narrative:  85 year old female who presented with fever and dyspnea. She does have significant past medical history for asthma, non-Hodgkin's lymphoma, Waldenstrm macroglobulinemia, bronchiectasis, arthritis and obstructive sleep apnea. Patient reported 7 days of fever, and persistent dyspnea. 4 weeks ago she had a viral illness which lasted for about 2 weeks, tested negative for COVID-19 back pain, she was left with persistent dyspnea since then.On her initial initial physical examination her temperature was 100.5, pressure 115/74, heart rate 139, respiratory rate 30, oxygen saturation 98%, she was lethargic, had dry mucous membranes, her lungs had no wheezing or rails, heart S1-S2 present, tachycardic, irregularly irregular, abdomen soft nontender, no lower extremity edema. Sodium 134, potassium 4.5, chloride 103, bicarb 19, glucose 99, BUN 25, creatinine 1.35, white count 7.1, hemoglobin 10.2, hematocrit 28.4, platelets 316 d-dimer 4.1, fibrinogen 258, INR 1.5.SARS COVID-19 negative, urinalysis negative for infection. Chest radiograph with bibasilar atelectasis more right than left.Chest CT with no pulmonary embolism, no evident infiltrate, positive bronchiectasis. #1EKG 83 bpm, normal axis, right bundle branch block, sinus rhythm, T wave inversions V1 to V4.#2EKG 150 bpm, normal axis, right bundle branch block, positive PVC,suspected SVT,persistent T wave inversions in the anterior septal V1 to V4 and new lateral ST depressions,V5 and V6.  Patient was admitted to the hospital working diagnosis ofsuspectedsepsis complicated by atrial fibrillation with rapid ventricular response.   Assessment & Plan:   Principal Problem:   Atrial fibrillation with RVR (HCC) Active Problems:   Asthma   OSA (obstructive sleep apnea)   Sepsis (HCC)   Chronic dyspnea   Acute diastolic  CHF (congestive heart failure) (HCC)   Respiratory failure (HCC) wit hypoxia   CKD (chronic kidney disease) stage 3, GFR 30-59 ml/min (HCC)   AKI (acute kidney injury) ([place])   1. New onset atrial fibrillation complicated with acute on chronic diastolic heart failure, acute hypoxic respiratory failure and cardiogenic pulmonary edema. Patient has remained on sinus rhythm, HR 60 to 70, will continue rate control with metoprolol, tolerating well apixaban for anticoagulation. Continue telemetry monitoring. No signs of further hypervolemia, patient had 2nd dose of furosemide yesterday, net fluid balance is (negative 571). Will repeat chest film today.   2. Bronchiectasis with acute exacerbation. Will continue bronchodilator with duoneb, will add inhaled steroid and systemic steroids, continue oxymetry monitoring and supplemental 02 per [place]. Will change guaifenesin to scheduled and will add chest pt, along with flutter valve.   4. Left leg skin graft. Dessing changes once a week.   5. Obesity. BMI is 45.8.  6. New AKI on CKD stage 3b. Worsening renal function with serum cr at 1,66, K at 4,1 and serum bicarbonate at 21, will continue to follow renal function in am, hold on further diuresis for now.   Sepsis has been ruled out.   Will transfer patient to telemetry today.   DVT prophylaxis:apixaban Code Status:full Family Communication:no family at the bedside Disposition Plan/ discharge barriers:pending clinical improvement  Body mass index is 45.84 kg/m. Malnutrition Type:      Malnutrition Characteristics:      Nutrition Interventions:     RN Pressure Injury Documentation:     Consultants:     Procedures:     Antimicrobials:       Subjective: Patient continue to have chest pressure, increase cough with increase sputum production, positive pleuritic chest pain, no palpitations, no nausea or vomiting.  Objective: Vitals:   01/09/19 0000 01/09/19  0201 01/09/19 0400 01/09/19 0720  BP:      Pulse:      Resp:      Temp: 98.2 F (36.8 C)  98.3 F (36.8 C)   TempSrc: Oral  Oral   SpO2:  96%  95%  Weight:      Height:        Intake/Output Summary (Last 24 hours) at 01/09/2019 0800 Last data filed at 01/08/2019 2200 Gross per 24 hour  Intake 120 ml  Output 800 ml  Net -680 ml   Filed Weights   01/06/19 1825  Weight: 128.8 kg    Examination:   General: deconditioned  Neurology: Awake and alert, non focal  E ENT: mild pallor, no icterus, oral mucosa moist Cardiovascular: No JVD. S1-S2 present, rhythmic, no gallops, rubs, or murmurs. Trace lower extremity edema. Pulmonary: positive breath sounds bilaterally, decreased air movement, no wheezing, diffuse bilateral rhonchi bilaterally, no rales. Gastrointestinal. Abdomen protuberant with no organomegaly, non tender, no rebound or guarding Skin. No rashes Musculoskeletal: no joint deformities     Data Reviewed: I have personally reviewed following labs and imaging studies  CBC: Recent Labs  Lab 01/06/19 1822 01/07/19 0807 01/08/19 0034 01/09/19 0246  WBC 7.1 7.7 8.4 5.7  NEUTROABS 5.6  --   --  4.2  HGB 10.2* 9.6* 9.2* 9.2*  HCT 28.4* 27.3* 26.0* 26.2*  MCV 91.9 91.9 90.9 92.3  PLT 316 310 333 974   Basic Metabolic Panel: Recent Labs  Lab 01/06/19 1822 01/07/19 0807 01/08/19 0034 01/09/19 0246  NA 134* 136 136 137  K 4.5 3.8 4.0 4.1  CL 103 106 106 106  CO2 19* 21* 21* 21*  GLUCOSE 99 91 104* 90  BUN 25* 23 24* 32*  CREATININE 1.35* 1.32* 1.57* 1.66*  CALCIUM 8.8* 8.4* 8.5* 8.4*   GFR: Estimated Creatinine Clearance: 34.1 mL/min (A) (by C-G formula based on SCr of 1.66 mg/dL (H)). Liver Function Tests: Recent Labs  Lab 01/06/19 1822  AST 25  ALT 15  ALKPHOS 94  BILITOT 1.0  PROT 6.9  ALBUMIN 3.3*   No results for input(s): LIPASE, AMYLASE in the last 168 hours. No results for input(s): AMMONIA in the last 168 hours. Coagulation Profile:  Recent Labs  Lab 01/07/19 0301  INR 1.5*   Cardiac Enzymes: Recent Labs  Lab 01/06/19 1822  TROPONINI <0.03   BNP (last 3 results) Recent Labs    10/12/18 1144 12/30/18 1117  PROBNP 455 673   HbA1C: No results for input(s): HGBA1C in the last 72 hours. CBG: No results for input(s): GLUCAP in the last 168 hours. Lipid Profile: Recent Labs    01/06/19 1841  TRIG 81   Thyroid Function Tests: Recent Labs    01/07/19 0807  TSH 1.201  FREET4 1.05   Anemia Panel: Recent Labs    01/07/19 0807  VITAMINB12 316  FOLATE 72.7  FERRITIN 407*  TIBC 184*  IRON 26*  RETICCTPCT 1.1      Radiology Studies: I have reviewed all of the imaging during this hospital visit personally     Scheduled Meds: . apixaban  2.5 mg Oral BID  . calcium carbonate  1,250 mg Oral Daily  . Chlorhexidine Gluconate Cloth  6 each Topical Daily  . clopidogrel  75 mg Oral Daily  . ferrous sulfate  325 mg Oral Daily  . ipratropium-albuterol  3 mL Nebulization Q6H  . mouth rinse  15 mL  Mouth Rinse BID  . metoprolol tartrate  25 mg Oral BID   Continuous Infusions:   LOS: 1 day        Fatih Stalvey Gerome Apley, MD

## 2019-01-09 NOTE — Consult Note (Signed)
[facility] Nurse wound consult note Reason for Consult: Unnas boot with skin graft Patient is followed by Dr. Winifred Olive at the skin center for skin graft performed in March of this year Wound type:skin graft   Dressing procedure/placement/frequency: Patient has very regimented wound care that is performed on Wednesdays. She has given me instructions for the care and I have updated the orders for the unnas boot and topical care to be performed on Wednesdays beginning this week while inpatient.   Discussed POC with patient and bedside nurse.  Re consult if needed, will not follow at this time. Thanks  Jasmain Ahlberg [employer], RN,CWOCN, CNS, [place] [phone])

## 2019-01-09 NOTE — Progress Notes (Signed)
Pt knows and understand how to do flutter valve.

## 2019-01-09 NOTE — Progress Notes (Signed)
Pt continues to decline CPAP QHS.  RT to monitor and assess as needed.  

## 2019-01-10 ENCOUNTER — Ambulatory Visit: Payer: Medicare Other | Admitting: Pulmonary Disease

## 2019-01-10 LAB — BASIC METABOLIC PANEL
Anion gap: 10 (ref 5–15)
BUN: 33 mg/dL — ABNORMAL HIGH (ref 8–23)
CO2: 20 mmol/L — ABNORMAL LOW (ref 22–32)
Calcium: 8.9 mg/dL (ref 8.9–10.3)
Chloride: 108 mmol/L (ref 98–111)
Creatinine, Ser: 1.3 mg/dL — ABNORMAL HIGH (ref 0.44–1.00)
GFR calc Af Amer: 43 mL/min — ABNORMAL LOW (ref >60)
GFR calc non Af Amer: 37 mL/min — ABNORMAL LOW (ref >60)
Glucose, Bld: 146 mg/dL — ABNORMAL HIGH (ref 70–99)
Potassium: 4.2 mmol/L (ref 3.5–5.1)
Sodium: 138 mmol/L (ref 135–145)

## 2019-01-10 MED ORDER — LEVALBUTEROL HCL 0.63 MG/3ML IN NEBU
INHALATION_SOLUTION | RESPIRATORY_TRACT | Status: AC
  Filled 2019-01-10: qty 3

## 2019-01-10 MED ORDER — LEVALBUTEROL HCL 0.63 MG/3ML IN NEBU
0.6300 mg | INHALATION_SOLUTION | Freq: Two times a day (BID) | RESPIRATORY_TRACT | Status: DC
  Administered 2019-01-10 – 2019-01-12 (×4): 0.63 mg via RESPIRATORY_TRACT
  Filled 2019-01-10 (×3): qty 3

## 2019-01-10 MED ORDER — APIXABAN 5 MG PO TABS
5.0000 mg | ORAL_TABLET | Freq: Two times a day (BID) | ORAL | Status: DC
  Administered 2019-01-10 – 2019-01-12 (×4): 5 mg via ORAL
  Filled 2019-01-10 (×4): qty 1

## 2019-01-10 MED ORDER — PREDNISONE 20 MG PO TABS
20.0000 mg | ORAL_TABLET | Freq: Every day | ORAL | Status: DC
  Administered 2019-01-11 – 2019-01-12 (×2): 20 mg via ORAL
  Filled 2019-01-10 (×2): qty 1

## 2019-01-10 MED ORDER — LEVALBUTEROL HCL 0.63 MG/3ML IN NEBU: 0.6300 mg | INHALATION_SOLUTION | Freq: Three times a day (TID) | RESPIRATORY_TRACT | Status: DC | PRN

## 2019-01-10 NOTE — Evaluation (Signed)
Physical Therapy Evaluation Patient Details Name: Natasha Ellis MRN: [id number] DOB: [birth year] Today's Date: 01/10/2019   History of Present Illness  85 yo female admitted with Afib with RVR, sepsis. Hx of NHL, asthma, OA, flu, obesity, TIA, DJD-knees  Clinical Impression  On eval, pt was Min guard assist for mobility. She walked ~75 feet with a RW. Pt was eager to mobilize/ambulate. HR up to 140 bpm , dypsnea 2-3/4. O2 sats WNL. Will continue to follow and progress activity as tolerated. Pt plans to d/c to rehab section of [facility].     Follow Up Recommendations SNF    Equipment Recommendations  None recommended by PT    Recommendations for Other Services       Precautions / Restrictions Precautions Precautions: Fall Precaution Comments: monitor HR Restrictions Weight Bearing Restrictions: No      Mobility  Bed Mobility Overal bed mobility: Needs Assistance Bed Mobility: Supine to Sit     Supine to sit: Min guard;HOB elevated     General bed mobility comments: close guard for safety, lines  Transfers Overall transfer level: Needs assistance Equipment used: Rolling walker (2 wheeled) Transfers: Sit to/from Stand Sit to Stand: Min guard;From elevated surface         General transfer comment: Increased time. Close guard for safety. VCs hand placement.   Ambulation/Gait Ambulation/Gait assistance: Min guard Gait Distance (Feet): 75 Feet Assistive device: Rolling walker (2 wheeled) Gait Pattern/deviations: Decreased stride length     General Gait Details: close guard for safety. slow gait speed with multiple standing rest breaks taken. HR up to 140 bpm. Dypsnea 2-3/4.   Stairs            Wheelchair Mobility    Modified Rankin (Stroke Patients Only)       Balance Overall balance assessment: Needs assistance         Standing balance support: Bilateral upper extremity supported Standing balance-Leahy Scale: Poor                               Pertinent Vitals/Pain Pain Assessment: No/denies pain    Home Living Family/patient expects to be discharged to:: Unsure     Type of Home: Independent living facility([facility]) Home Access: Level entry     Home Layout: One level Home Equipment: Walker - 2 wheels;Walker - 4 wheels;Cane - single point      Prior Function Level of Independence: Independent with assistive device(s)         Comments: uses rollator for amb     Hand Dominance        Extremity/Trunk Assessment   Upper Extremity Assessment Upper Extremity Assessment: Generalized weakness    Lower Extremity Assessment Lower Extremity Assessment: Generalized weakness    Cervical / Trunk Assessment Cervical / Trunk Assessment: Normal  Communication   Communication: HOH  Cognition Arousal/Alertness: Awake/alert Behavior During Therapy: WFL for tasks assessed/performed Overall Cognitive Status: Within Functional Limits for tasks assessed                                        General Comments      Exercises     Assessment/Plan    PT Assessment Patient needs continued PT services  PT Problem List Decreased strength;Decreased balance;Decreased mobility;Decreased activity tolerance;Decreased knowledge of use of DME       PT Treatment  Interventions DME instruction;Gait training;Therapeutic activities;Functional mobility training;Balance training;Patient/family education;Therapeutic exercise    PT Goals (Current goals can be found in the Care Plan section)  Acute Rehab PT Goals Patient Stated Goal: to get better  PT Goal Formulation: With patient Time For Goal Achievement: 01/24/19 Potential to Achieve Goals: Good    Frequency Min 3X/week   Barriers to discharge        Co-evaluation               AM-PAC PT "6 Clicks" Mobility  Outcome Measure Help needed turning from your back to your side while in a flat bed without using bedrails?: A Little Help  needed moving from lying on your back to sitting on the side of a flat bed without using bedrails?: A Little Help needed moving to and from a bed to a chair (including a wheelchair)?: A Little Help needed standing up from a chair using your arms (e.g., wheelchair or bedside chair)?: A Little Help needed to walk in hospital room?: A Little Help needed climbing 3-5 steps with a railing? : A Lot 6 Click Score: 17    End of Session Equipment Utilized During Treatment: Gait belt Activity Tolerance: Patient limited by fatigue Patient left: in chair;with call bell/phone within reach   PT Visit Diagnosis: Muscle weakness (generalized) (M62.81);Difficulty in walking, not elsewhere classified (R26.2);Unsteadiness on feet (R26.81)    Time: 1030-1058 PT Time Calculation (min) (ACUTE ONLY): 28 min   Charges:   PT Evaluation $PT Eval Moderate Complexity: 1 Mod PT Treatments $Gait Training: 8-22 mins         Weston Anna, PT Acute Rehabilitation Services Pager: [phone] Office: [phone]

## 2019-01-10 NOTE — Progress Notes (Signed)
PROGRESS NOTE    Natasha Ellis  ZOX:[id number] DOB: [birth year] DOA: 01/06/2019 PCP: Lavone Orn, MD    Brief Narrative:  85 year old female who presented with fever and dyspnea. She does have significant past medical history for asthma, non-Hodgkin's lymphoma, Waldenstrm macroglobulinemia, bronchiectasis, arthritis and obstructive sleep apnea. Patient reported 7 days of fever, and persistent dyspnea. 4 weeks ago she had a viral illness which lasted for about 2 weeks, tested negative for COVID-19 back pain, she was left with persistent dyspnea since then.On her initial initial physical examination her temperature was 100.5, pressure 115/74, heart rate 139, respiratory rate 30, oxygen saturation 98%, she was lethargic, had dry mucous membranes, her lungs had no wheezing or rails, heart S1-S2 present, tachycardic, irregularly irregular, abdomen soft nontender, no lower extremity edema. Sodium 134, potassium 4.5, chloride 103, bicarb 19, glucose 99, BUN 25, creatinine 1.35, white count 7.1, hemoglobin 10.2, hematocrit 28.4, platelets 316 d-dimer 4.1, fibrinogen 258, INR 1.5.SARS COVID-19 negative, urinalysis negative for infection. Chest radiograph with bibasilar atelectasis more right than left.Chest CT with no pulmonary embolism, no evident infiltrate, positive bronchiectasis. #1EKG 83 bpm, normal axis, right bundle branch block, sinus rhythm, T wave inversions V1 to V4.#2EKG 150 bpm, normal axis, right bundle branch block, positive PVC,suspected SVT,persistent T wave inversions in the anterior septal V1 to V4 and new lateral ST depressions,V5 and V6.  Patient was admitted to the hospital working diagnosis ofsuspectedsepsis complicated by atrial fibrillation with rapid ventricular response.   Assessment & Plan:   Principal Problem:   Atrial fibrillation with RVR (HCC) Active Problems:   Asthma   OSA (obstructive sleep apnea)   Sepsis (HCC)   Chronic dyspnea   Acute diastolic  CHF (congestive heart failure) (HCC)   Respiratory failure (HCC) wit hypoxia   CKD (chronic kidney disease) stage 3, GFR 30-59 ml/min (HCC)   AKI (acute kidney injury) ([place])   1. New onset atrial fibrillationcomplicated with acute on chronic diastolic heart failure, acute hypoxic respiratory failure and cardiogenic pulmonary edema.Patient went back into atrial fibrillation with RVR yesterday, change metoprolol for diltiazem 240 mg with improvement of HR, down to the 90's. Clinically euvolemic. Continue as needed metoprolol IV. CAHDSVasc score is 3, will continue anticoagulation with apixaban. Her LV function is preserved.   2. Bronchiectasis with acute exacerbation.Her dyspnea has improved, will continue bronchodilator therapy, flutter valve, chest pt, antitussive therapy, systemic and inhaled steroids. Will decrease prednisone to 20 mg due to patient's tremors and difficulty sleeping. Continue oxymetry monitoring. Out of bed and physical therapy evaluation.  4. Left leg skin graft.Continue with dessing changes once a week.  5. Obesity. BMI is 45.8. Will need outpatient follow up.   6. New AKI on CKD stage 3b. renal function with serum cr at 1,30 with K at 4.2 and serum bicarbonate at 20. Will follow on renal panel in am, hold on further diuresis, avoid hypotension and nephrotoxic medications.   Sepsis has been ruled out.   Transfer patient to telemetry today, if continue rate control.   DVT prophylaxis:apixaban Code Status:full Family Communication:no family at the bedside Disposition Plan/ discharge barriers:pending clinical improvement Body mass index is 45.84 kg/m. Malnutrition Type:      Malnutrition Characteristics:      Nutrition Interventions:     RN Pressure Injury Documentation:     Consultants:     Procedures:     Antimicrobials:       Subjective: Patient reports tremors and difficulty sleeping last night, positive fatigue when  RVR.  No chest pain. Her dyspnea and chest pressure have improved but not yet back to baseline.   Objective: Vitals:   01/10/19 0445 01/10/19 0500 01/10/19 0600 01/10/19 0700  BP: (!) 152/104 132/72 121/75 (!) 144/79  Pulse: 94 79 90 69  Resp: (!) 25 20 (!) 25 (!) 22  Temp:      TempSrc:      SpO2: 93% 92% 91% 91%  Weight:      Height:        Intake/Output Summary (Last 24 hours) at 01/10/2019 0807 Last data filed at 01/09/2019 2200 Gross per 24 hour  Intake 240 ml  Output 900 ml  Net -660 ml   Filed Weights   01/06/19 1825  Weight: 128.8 kg    Examination:   General: deconditioned, not in pain.  Neurology: Awake and alert, non focal  E ENT: positive pallor, no icterus, oral mucosa moist Cardiovascular: No JVD. S1-S2 present, rhythmic, no gallops, rubs, or murmurs. No lower extremity edema. Pulmonary: decreased breath sounds bilaterally, mild expiratory wheezing, scattered rhonchi, but no rales. Gastrointestinal. Abdomen protuberant with no organomegaly, non tender, no rebound or guarding Skin. No rashes/ left leg with dressing in place.  Musculoskeletal: no joint deformities     Data Reviewed: I have personally reviewed following labs and imaging studies  CBC: Recent Labs  Lab 01/06/19 1822 01/07/19 0807 01/08/19 0034 01/09/19 0246  WBC 7.1 7.7 8.4 5.7  NEUTROABS 5.6  --   --  4.2  HGB 10.2* 9.6* 9.2* 9.2*  HCT 28.4* 27.3* 26.0* 26.2*  MCV 91.9 91.9 90.9 92.3  PLT 316 310 333 923   Basic Metabolic Panel: Recent Labs  Lab 01/06/19 1822 01/07/19 0807 01/08/19 0034 01/09/19 0246 01/10/19 0217  NA 134* 136 136 137 138  K 4.5 3.8 4.0 4.1 4.2  CL 103 106 106 106 108  CO2 19* 21* 21* 21* 20*  GLUCOSE 99 91 104* 90 146*  BUN 25* 23 24* 32* 33*  CREATININE 1.35* 1.32* 1.57* 1.66* 1.30*  CALCIUM 8.8* 8.4* 8.5* 8.4* 8.9   GFR: Estimated Creatinine Clearance: 43.5 mL/min (A) (by C-G formula based on SCr of 1.3 mg/dL (H)). Liver Function Tests: Recent Labs   Lab 01/06/19 1822  AST 25  ALT 15  ALKPHOS 94  BILITOT 1.0  PROT 6.9  ALBUMIN 3.3*   No results for input(s): LIPASE, AMYLASE in the last 168 hours. No results for input(s): AMMONIA in the last 168 hours. Coagulation Profile: Recent Labs  Lab 01/07/19 0301  INR 1.5*   Cardiac Enzymes: Recent Labs  Lab 01/06/19 1822  TROPONINI <0.03   BNP (last 3 results) Recent Labs    10/12/18 1144 12/30/18 1117  PROBNP 455 673   HbA1C: No results for input(s): HGBA1C in the last 72 hours. CBG: Recent Labs  Lab 01/09/19 2001  GLUCAP 171*   Lipid Profile: No results for input(s): CHOL, HDL, LDLCALC, TRIG, CHOLHDL, LDLDIRECT in the last 72 hours. Thyroid Function Tests: No results for input(s): TSH, T4TOTAL, FREET4, T3FREE, THYROIDAB in the last 72 hours. Anemia Panel: No results for input(s): VITAMINB12, FOLATE, FERRITIN, TIBC, IRON, RETICCTPCT in the last 72 hours.    Radiology Studies: I have reviewed all of the imaging during this hospital visit personally     Scheduled Meds: . [START ON 01/11/2019] acetic acid   Irrigation Q Wed  . apixaban  2.5 mg Oral BID  . calcium carbonate  1,250 mg Oral Daily  . Chlorhexidine Gluconate Cloth  6 each Topical Daily  . clopidogrel  75 mg Oral Daily  . diltiazem  240 mg Oral Daily  . ferrous sulfate  325 mg Oral Daily  . fluticasone furoate-vilanterol  1 puff Inhalation Daily  . guaiFENesin-dextromethorphan  10 mL Oral Q4H  . ipratropium-albuterol  3 mL Nebulization Q6H  . mouth rinse  15 mL Mouth Rinse BID  . predniSONE  40 mg Oral Q breakfast   Continuous Infusions:   LOS: 2 days        Natasha [place] Gerome Apley, MD

## 2019-01-10 NOTE — NC FL2 (Signed)
[place] MEDICAID FL2 LEVEL OF CARE SCREENING TOOL     IDENTIFICATION  Patient Name: Natasha Ellis Birthdate: 07-21-1931 Sex: female Admission Date (Current Location): 01/06/2019  [facility] and [place] Number:  Herbalist and Address:  [facility],  [address], [place]      Provider Number: [id number]  Attending Physician Name and Address:  Tawni Millers  Relative Name and Phone Number:       Current Level of Care: Hospital Recommended Level of Care: [place] Prior Approval Number:    Date Approved/Denied:   PASRR Number: [id number]  Discharge Plan: SNF    Current Diagnoses: Patient Active Problem List   Diagnosis Date Noted  . Acute diastolic CHF (congestive heart failure) ([place]) 01/08/2019  . Respiratory failure ([place]) wit hypoxia 01/08/2019  . CKD (chronic kidney disease) stage 3, GFR 30-59 ml/min (HCC) 01/08/2019  . AKI (acute kidney injury) ([place]) 01/08/2019  . Sepsis ([place]) 01/07/2019  . Atrial fibrillation with RVR ([place]) 01/07/2019  . Chronic dyspnea 01/07/2019  . Physical deconditioning 09/15/2018  . Bronchiectasis ([place]) 09/06/2018  . Lung nodule 09/06/2018  . Ascending aortic aneurysm ([place]) 09/06/2018  . Hypoxia 08/15/2016  . Severe obesity (BMI >= 40) ([place]) 07/30/2015  . Recurrent pneumonia 06/19/2015  . Anemia 06/10/2015  . Anemia due to other cause 06/10/2015  . OSA (obstructive sleep apnea) 09/20/2014  . History of lymphoma 09/20/2014  . History of TIA (transient ischemic attack) 09/20/2014  . Diarrhea   . Pain in gums 08/15/2014  . Neutropenia ([place]) 08/15/2014  . Hypersensitivity reaction 05/31/2014  . Waldenstrom's macroglobulinemia ([place]) 04/19/2014  . HTN (hypertension) 04/29/2013  . HLD (hyperlipidemia) 04/29/2013  . Lacunar infarction ([place]) 04/29/2013  . GERD (gastroesophageal reflux disease) 04/29/2013  . TIA (transient ischemic attack) 04/29/2013  . Back pain 04/29/2013  .  Asthma 04/29/2013  . Dyspnea 01/25/2013  . Chronic cough 01/01/2013    Orientation RESPIRATION BLADDER Height & Weight     Self, Time, Situation, Place  Normal Continent Weight: 284 lb (128.8 kg) Height:  5\' 6"  (167.6 cm)  BEHAVIORAL SYMPTOMS/MOOD NEUROLOGICAL BOWEL NUTRITION STATUS      Continent Diet(heart healthy)  AMBULATORY STATUS COMMUNICATION OF NEEDS Skin   Extensive Assist Verbally Other (Comment)(skin graft left leg- compression wrap)                       Personal Care Assistance Level of Assistance  Bathing, Feeding, Dressing Bathing Assistance: Limited assistance Feeding assistance: Independent Dressing Assistance: Limited assistance     Functional Limitations Info  Sight, Hearing, Speech Sight Info: Adequate Hearing Info: Adequate Speech Info: Adequate    SPECIAL CARE FACTORS FREQUENCY  PT (By licensed PT), OT (By licensed OT)     PT Frequency: 5x OT Frequency: 5x            Contractures Contractures Info: Not present    Additional Factors Info  Code Status, Allergies Code Status Info: DNR Allergies Info: Codeine, Amoxicillin, Cefdinir, Doxycycline, Erythromycin, Lisinopril, Oxybutynin, Sulfa Antibiotics           Current Medications (01/10/2019):  This is the current hospital active medication list Current Facility-Administered Medications  Medication Dose Route Frequency Provider Last Rate Last Dose  . acetaminophen (TYLENOL) tablet 1,000 mg  1,000 mg Oral Q6H PRN Shela Leff, MD   1,000 mg at 01/08/19 2110  . [START ON 01/11/2019] acetic acid 0.25 % irrigation   Irrigation Q Wed Arrien,  Jimmy Picket, MD      . apixaban Arne [place]) tablet 2.5 mg  2.5 mg Oral BID Eudelia Bunch, RPH   2.5 mg at 01/10/19 6720  . calcium carbonate (OS-CAL - dosed in mg of elemental calcium) tablet 1,250 mg  1,250 mg Oral Daily Shela Leff, MD   1,250 mg at 01/10/19 0836  . Chlorhexidine Gluconate Cloth 2 % PADS 6 each  6 each Topical Daily  Arrien, Jimmy Picket, MD   6 each at 01/10/19 [phone]  . clopidogrel (PLAVIX) tablet 75 mg  75 mg Oral Daily Shela Leff, MD   75 mg at 01/09/19 0902  . diltiazem (CARDIZEM CD) 24 hr capsule 240 mg  240 mg Oral Daily Arrien, Jimmy Picket, MD   240 mg at 01/10/19 0835  . ferrous sulfate tablet 325 mg  325 mg Oral Daily Shela Leff, MD   325 mg at 01/10/19 0836  . fluticasone furoate-vilanterol (BREO ELLIPTA) 200-25 MCG/INH 1 puff  1 puff Inhalation Daily Arrien, Jimmy Picket, MD   1 puff at 01/10/19 [phone]  . guaiFENesin-dextromethorphan (ROBITUSSIN DM) 100-10 MG/5ML syrup 10 mL  10 mL Oral Q4H Arrien, Jimmy Picket, MD   10 mL at 01/10/19 0300  . ipratropium-albuterol (DUONEB) 0.5-2.5 (3) MG/3ML nebulizer solution 3 mL  3 mL Nebulization Q6H Lenis Noon, RPH   3 mL at 01/10/19 3662  . ipratropium-albuterol (DUONEB) 0.5-2.5 (3) MG/3ML nebulizer solution 3 mL  3 mL Nebulization Q2H PRN Arrien, Jimmy Picket, MD      . MEDLINE mouth rinse  15 mL Mouth Rinse BID Arrien, Jimmy Picket, MD   15 mL at 01/10/19 0837  . metoprolol tartrate (LOPRESSOR) injection 5 mg  5 mg Intravenous Q1H PRN Arrien, Jimmy Picket, MD   5 mg at 01/09/19 1746  . ondansetron (ZOFRAN) injection 4 mg  4 mg Intravenous Q6H PRN Shela Leff, MD      . Derrill Memo ON 01/11/2019] predniSONE (DELTASONE) tablet 20 mg  20 mg Oral Q breakfast Arrien, Jimmy Picket, MD      . sodium chloride (OCEAN) 0.65 % nasal spray 1 spray  1 spray Each Nare TID PRN Shela Leff, MD         Discharge Medications: Please see discharge summary for a list of discharge medications.  Relevant Imaging Results:  Relevant Lab Results:   Additional Information SS# [id number]  Nila Nephew, LCSW

## 2019-01-10 NOTE — TOC Initial Note (Signed)
Transition of Care [facility]) - Initial/Assessment Note    Patient Details  Name: LAUREY SALSER MRN: [id number] Date of Birth: Feb 10, 1931  Transition of Care [facility]) CM/SW Contact:    Nila Nephew, LCSW Phone Number: [phone] 01/10/2019, 10:25 AM  Clinical Narrative:  Pt admitted with a fib, acute on chronic diastolic heart failure.  Pt has a left leg skin graft and does weekly dressing changes Wednesdays. Pt resides at [place] home at [facility] and is going to admit to SNF there at DC for therapy and quarantine in private room. CSW completed FL2 and spoke with [facility] who is expecting pt at DC.                 Expected Discharge Plan: Skilled Nursing Facility Barriers to Discharge: Continued Medical Work up   Patient Goals and CMS Choice Patient states their goals for this hospitalization and ongoing recovery are:: "I'm ready to go and work with therapy" CMS Medicare.gov Compare Post Acute Care list provided to:: Patient Choice offered to / list presented to : NA(pt is resident at [facility] and PTA had arranged to go to SNF there)  Expected Discharge Plan and Services Expected Discharge Plan: [place] Choice: Welcome arrangements for the past 2 months: [place]                                      Prior Living Arrangements/Services Living arrangements for the past 2 months: [place] Lives with:: Self, Facility Resident Patient language and need for interpreter reviewed:: No Do you feel safe going back to the place where you live?: Yes      Need for Family Participation in Patient Care: No (Comment) Care giver support system in place?: Yes (comment)([place] resident) Current home services: DME Criminal Activity/Legal Involvement Pertinent to Current Situation/Hospitalization: No - Comment as needed  Activities of Daily Living Home Assistive  Devices/Equipment: [place] Rile (specify type) ADL Screening (condition at time of admission) Patient's cognitive ability adequate to safely complete daily activities?: Yes Is the patient deaf or have difficulty hearing?: Yes Does the patient have difficulty seeing, even when wearing glasses/contacts?: No Does the patient have difficulty concentrating, remembering, or making decisions?: No Patient able to express need for assistance with ADLs?: Yes Does the patient have difficulty dressing or bathing?: No Independently performs ADLs?: Yes (appropriate for developmental age) Does the patient have difficulty walking or climbing stairs?: Yes Weakness of Legs: None Weakness of Arms/Hands: None  Permission Sought/Granted Permission sought to share information with : Chartered certified accountant granted to share information with : Yes, Verbal Permission Granted     Permission granted to share info w AGENCY: [place]        Emotional Assessment Appearance:: Appears stated age Attitude/Demeanor/Rapport: Engaged Affect (typically observed): Pleasant, Appropriate Orientation: : Oriented to Self, Oriented to Place, Oriented to  Time, Oriented to Situation Alcohol / Substance Use: Not Applicable Psych Involvement: No (comment)  Admission diagnosis:  Shortness of breath [R06.02] New onset atrial fibrillation ([place]) [I48.91] Febrile illness [R50.9] Patient Active Problem List   Diagnosis Date Noted  . Acute diastolic CHF (congestive heart failure) ([place]) 01/08/2019  . Respiratory failure ([place]) wit hypoxia 01/08/2019  . CKD (chronic kidney disease) stage 3, GFR 30-59 ml/min (HCC) 01/08/2019  . AKI (acute kidney injury) ([place]) 01/08/2019  . Sepsis (  [place]) 01/07/2019  . Atrial fibrillation with RVR ([place]) 01/07/2019  . Chronic dyspnea 01/07/2019  . Physical deconditioning 09/15/2018  . Bronchiectasis ([place]) 09/06/2018  . Lung nodule 09/06/2018  . Ascending aortic aneurysm ([place])  09/06/2018  . Hypoxia 08/15/2016  . Severe obesity (BMI >= 40) ([place]) 07/30/2015  . Recurrent pneumonia 06/19/2015  . Anemia 06/10/2015  . Anemia due to other cause 06/10/2015  . OSA (obstructive sleep apnea) 09/20/2014  . History of lymphoma 09/20/2014  . History of TIA (transient ischemic attack) 09/20/2014  . Diarrhea   . Pain in gums 08/15/2014  . Neutropenia ([place]) 08/15/2014  . Hypersensitivity reaction 05/31/2014  . Waldenstrom's macroglobulinemia ([place]) 04/19/2014  . HTN (hypertension) 04/29/2013  . HLD (hyperlipidemia) 04/29/2013  . Lacunar infarction ([place]) 04/29/2013  . GERD (gastroesophageal reflux disease) 04/29/2013  . TIA (transient ischemic attack) 04/29/2013  . Back pain 04/29/2013  . Asthma 04/29/2013  . Dyspnea 01/25/2013  . Chronic cough 01/01/2013   PCP:  Lavone Orn, MD Pharmacy:   [place], [place] [address] Phone: [phone] Fax: [place], [place] [facility] [address] [place] [address] [place] [address] Phone: [phone] Fax: [phone]     Social Determinants of Health ([place]) Interventions    Readmission Risk Interventions Readmission Risk Prevention Plan 01/09/2019 01/09/2019  Transportation Screening Complete Complete  PCP or Specialist Appt within 3-5 Days Not Complete Not Complete  Not Complete comments not ready for d/c -  [place] or Home Care Consult Complete -  Social Work Consult for [place] Planning/Counseling Complete -  Palliative Care Screening Not Applicable -  Medication Review (RN Care Manager) Complete -  Some recent data might be hidden

## 2019-01-10 NOTE — Progress Notes (Signed)
[place] for apixaban Indication: Afib  Allergies  Allergen Reactions  . Codeine Other (See Comments)    Crazy-nightmares  . Amoxicillin Diarrhea    Has patient had a PCN reaction causing immediate rash, facial/tongue/throat swelling, SOB or lightheadedness with hypotension:  No Has patient had a PCN reaction causing severe rash involving mucus membranes or skin necrosis: No Has patient had a PCN reaction that required hospitalization No Has patient had a PCN reaction occurring within the last 10 years: Unknown--rx is diarrhea. If all of the above answers are "NO", then may proceed with Cephalosporin use.    . Cefdinir Cough  . Doxycycline Diarrhea  . Erythromycin Other (See Comments)    GI upset  . Lisinopril Cough  . Oxybutynin Other (See Comments)    Dry mouth  . Sulfa Antibiotics Other (See Comments)    GI upset    Patient Measurements: Height: 5\' 6"  (167.6 cm) Weight: 284 lb (128.8 kg) IBW/kg (Calculated) : 59.3 Heparin Dosing Weight: 90.5  Vital Signs: Temp: 98 F (36.7 C) (06/09 0300) Temp Source: Oral (06/09 0300) BP: 174/84 (06/09 1023) Pulse Rate: 112 (06/09 1023)  Labs: Recent Labs    01/07/19 1223 01/08/19 0034 01/09/19 0246 01/10/19 0217  HGB  --  9.2* 9.2*  --   HCT  --  26.0* 26.2*  --   PLT  --  333 333  --   HEPARINUNFRC 0.11* 0.12*  --   --   CREATININE  --  1.57* 1.66* 1.30*    Estimated Creatinine Clearance: 43.5 mL/min (A) (by C-G formula based on SCr of 1.3 mg/dL (H)).    Assessment: Patient in ED with [facility], new afib and possible PE.  No oral anticoagulants noted on med rec. Baseline coags ordered.  D-Dimer elevated.  01/10/2019  Stable CBC  No reported bleeding  SCr 1.3 continues to improve  Goal of Therapy:  Heparin level 0.3-0.7 units/ml Monitor platelets by anticoagulation protocol: Yes   Plan:  1) Due to SCr now < 1.5, will increase apixaban from 2.5mg  BID to 5mg  BID per protocol  with patient only have 1 criteria for dose reduction now with age > 67 2) Continue to monitor renal function, CBC and signs/sx's of bleeding 3) Will educate when possible  Adrian Saran, PharmD, BCPS 01/10/2019 11:17 AM

## 2019-01-11 LAB — CULTURE, BLOOD (ROUTINE X 2): Culture: NO GROWTH

## 2019-01-11 LAB — BASIC METABOLIC PANEL
Anion gap: 10 (ref 5–15)
BUN: 36 mg/dL — ABNORMAL HIGH (ref 8–23)
CO2: 21 mmol/L — ABNORMAL LOW (ref 22–32)
Calcium: 9.1 mg/dL (ref 8.9–10.3)
Chloride: 109 mmol/L (ref 98–111)
Creatinine, Ser: 1.27 mg/dL — ABNORMAL HIGH (ref 0.44–1.00)
GFR calc Af Amer: 45 mL/min — ABNORMAL LOW (ref >60)
GFR calc non Af Amer: 38 mL/min — ABNORMAL LOW (ref >60)
Glucose, Bld: 130 mg/dL — ABNORMAL HIGH (ref 70–99)
Potassium: 4.7 mmol/L (ref 3.5–5.1)
Sodium: 140 mmol/L (ref 135–145)

## 2019-01-11 MED ORDER — WHITE PETROLATUM EX OINT
TOPICAL_OINTMENT | CUTANEOUS | Status: DC | PRN
  Filled 2019-01-11: qty 5

## 2019-01-11 NOTE — Progress Notes (Signed)
PROGRESS NOTE    Natasha Ellis  MBW:[id number] DOB: 10/10/1930 DOA: 01/06/2019 PCP: Lavone Orn, MD    Brief Narrative:  85 year old female who presented with fever and dyspnea. She does have significant past medical history for asthma, non-Hodgkin's lymphoma, Waldenstrm macroglobulinemia, bronchiectasis, arthritis and obstructive sleep apnea. Patient reported 7 days of fever, and persistent dyspnea. 4 weeks ago she had a viral illness which lasted for about 2 weeks, tested negative for COVID-19 back pain, she was left with persistent dyspnea since then.  On her initial initial physical examination her temperature was 100.5, pressure 115/74, heart rate 139, respiratory rate 30, oxygen saturation 98%, she was lethargic, had dry mucous membranes, her lungs had no wheezing or rails, heart S1-S2 present, tachycardic, irregularly irregular, abdomen soft nontender, no lower extremity edema. Sodium 134, potassium 4.5, chloride 103, bicarb 19, glucose 99, BUN 25, creatinine 1.35, white count 7.1, hemoglobin 10.2, hematocrit 28.4, platelets 316 d-dimer 4.1, fibrinogen 258, INR 1.5.SARS COVID-19 negative, urinalysis negative for infection. Chest radiograph with bibasilar atelectasis more right than left.Chest CT with no pulmonary embolism, no evident infiltrate, positive bronchiectasis. #1EKG 83 bpm, normal axis, right bundle branch block, sinus rhythm, T wave inversions V1 to V4.#2EKG 150 bpm, normal axis, right bundle branch block, positive PVC,suspected SVT,persistent T wave inversions in the anterior septal V1 to V4 and new lateral ST depressions,V5 and V6.  Patient was admitted to the hospital working diagnosis ofsuspectedsepsis complicated by atrial fibrillation with rapid ventricular response.   Assessment & Plan:   Principal Problem:   Atrial fibrillation with RVR (HCC) Active Problems:   Asthma   OSA (obstructive sleep apnea)   Sepsis (HCC)   Chronic dyspnea   Acute  diastolic CHF (congestive heart failure) (HCC)   Respiratory failure (HCC) wit hypoxia   CKD (chronic kidney disease) stage 3, GFR 30-59 ml/min (HCC)   AKI (acute kidney injury) ([place])   New onset paroxysmal atrial fibrillation  acute on chronic diastolic heart failure acute hypoxic respiratory failure  CT angios PE study notable for cardiomegaly, main pulmonary artery dilation, interval increase in left lower lobe pulmonary nodule 1.7 cm, hepatic steatosis and no large centrally located pulmonary emboli.  Patient initially required Cardizem drip which has been titrated off.  Continues with atrial fibrillation, rate controlled on telemetry.  Received 2 doses of IV furosemide 40mg  on 6/6 and 6/7.  --Net negative 1 L this admission --Continue rate control with Cardizem 240 mg p.o. daily --Continue anticoagulation with apixaban 5 mg p.o. twice daily --Continue daily weights and strict I's and O's  Bronchiectasis with acute exacerbation.  Patient presenting with acute hypoxic respiratory failure likely secondary to bronchiectasis versus COPD exacerbation.  Initially requiring supplemental oxygen, now titrated off. --Continue bronchodilator therapy with DuoNeb's as needed --IV steroids now transition to oral prednisone 20 mg p.o. daily --Continue chest physiotherapy, flutter valve  Left leg skin graft.  Continue dressing changes weekly.    Morbid obesity BMI is 45.8, discussed with patient regarding need for aggressive weight loss measures as this complicates all facets of care.  New AKI on CKD stage 3b.  Baseline creatinine over the past year 1.1-1.2.  Creatinine on admission 1.35, elevated to 1.66 following IV diuresis as above. --Lasix discontinued --Creatinine now trending down, 1.30-->1.27 --Avoid nephrotoxins, renally dose all medications --Follow BMP daily  Left lower lobe lung nodule Noted slight interval increase in left lower lobe pulmonary nodule, up to 1.7 cm now.  --Radiology recommends repeat CT chest in 3 months for  further evaluation/monitoring  DVT prophylaxis:apixaban Code Status:full Family Communication: none Disposition Plan/ discharge barriers:Pending transfer to telemetry, anticipate discharge home to [facility] tomorrow with further rehabilitation.  Body mass index is 45.84 kg/m. Malnutrition Type:      Malnutrition Characteristics:      Nutrition Interventions:     RN Pressure Injury Documentation:       Subjective: Patient seen and examined at bedside, resting comfortably.  Concerned about her elevated blood pressure this morning.  No other complaints at this time.  Heart rate controlled.  Denies headache, no changes in vision, no chest pain, no shortness of breath, no palpitations, no abdominal pain, no issues with bowel/bladder function, no cough/congestion, no fever/chills/night sweats, no nausea/vomiting/diarrhea.  No acute events overnight per nursing staff.  Objective: Vitals:   01/11/19 0800 01/11/19 0803 01/11/19 1138 01/11/19 1200  BP:  (!) 187/95    Pulse:   94   Resp: (!) 26 (!) 21 (!) 21   Temp: (!) 97.5 F (36.4 C)   (!) 97.5 F (36.4 C)  TempSrc: Oral   Axillary  SpO2:   95%   Weight:      Height:        Intake/Output Summary (Last 24 hours) at 01/11/2019 1317 Last data filed at 01/11/2019 0600 Gross per 24 hour  Intake 240 ml  Output 300 ml  Net -60 ml   Filed Weights   01/06/19 1825  Weight: 128.8 kg    Examination:   General: deconditioned  Neurology: Awake and alert, non focal  E ENT: mild pallor, no icterus, oral mucosa moist Cardiovascular: No JVD. S1-S2 present, rhythmic, no gallops, rubs, or murmurs. Trace lower extremity edema. Pulmonary: positive breath sounds bilaterally, no wheezing, rales or rhonchi, oxygenating well on room air Gastrointestinal. Abdomen protuberant with no organomegaly, non tender, no rebound or guarding Skin. No rashes Musculoskeletal: no joint  deformities     Data Reviewed: I have personally reviewed following labs and imaging studies  CBC: Recent Labs  Lab 01/06/19 1822 01/07/19 0807 01/08/19 0034 01/09/19 0246  WBC 7.1 7.7 8.4 5.7  NEUTROABS 5.6  --   --  4.2  HGB 10.2* 9.6* 9.2* 9.2*  HCT 28.4* 27.3* 26.0* 26.2*  MCV 91.9 91.9 90.9 92.3  PLT 316 310 333 195   Basic Metabolic Panel: Recent Labs  Lab 01/07/19 0807 01/08/19 0034 01/09/19 0246 01/10/19 0217 01/11/19 0311  NA 136 136 137 138 140  K 3.8 4.0 4.1 4.2 4.7  CL 106 106 106 108 109  CO2 21* 21* 21* 20* 21*  GLUCOSE 91 104* 90 146* 130*  BUN 23 24* 32* 33* 36*  CREATININE 1.32* 1.57* 1.66* 1.30* 1.27*  CALCIUM 8.4* 8.5* 8.4* 8.9 9.1   GFR: Estimated Creatinine Clearance: 44.5 mL/min (A) (by C-G formula based on SCr of 1.27 mg/dL (H)). Liver Function Tests: Recent Labs  Lab 01/06/19 1822  AST 25  ALT 15  ALKPHOS 94  BILITOT 1.0  PROT 6.9  ALBUMIN 3.3*   No results for input(s): LIPASE, AMYLASE in the last 168 hours. No results for input(s): AMMONIA in the last 168 hours. Coagulation Profile: Recent Labs  Lab 01/07/19 0301  INR 1.5*   Cardiac Enzymes: Recent Labs  Lab 01/06/19 1822  TROPONINI <0.03   BNP (last 3 results) Recent Labs    10/12/18 1144 12/30/18 1117  PROBNP 455 673   HbA1C: No results for input(s): HGBA1C in the last 72 hours. CBG: Recent Labs  Lab 01/09/19 2001  GLUCAP 171*   Lipid Profile: No results for input(s): CHOL, HDL, LDLCALC, TRIG, CHOLHDL, LDLDIRECT in the last 72 hours. Thyroid Function Tests: No results for input(s): TSH, T4TOTAL, FREET4, T3FREE, THYROIDAB in the last 72 hours. Anemia Panel: No results for input(s): VITAMINB12, FOLATE, FERRITIN, TIBC, IRON, RETICCTPCT in the last 72 hours.    Radiology Studies: I have reviewed all of the imaging during this hospital visit personally     Scheduled Meds: . acetic acid   Irrigation Q Wed  . apixaban  5 mg Oral BID  . calcium  carbonate  1,250 mg Oral Daily  . Chlorhexidine Gluconate Cloth  6 each Topical Daily  . diltiazem  240 mg Oral Daily  . ferrous sulfate  325 mg Oral Daily  . fluticasone furoate-vilanterol  1 puff Inhalation Daily  . guaiFENesin-dextromethorphan  10 mL Oral Q4H  . levalbuterol  0.63 mg Nebulization BID  . mouth rinse  15 mL Mouth Rinse BID  . predniSONE  20 mg Oral Q breakfast   Continuous Infusions:   LOS: 3 days        Satomi Buda J [place] DO

## 2019-01-11 NOTE — Progress Notes (Signed)
Patient continues to decline nocturnal CPAP. Order changed to prn per RT protocol.  

## 2019-01-11 NOTE — Consult Note (Signed)
WOC consulted again for assistance with the orders on the LLE. Patient has had skin graft on the LLE and was evaluated by the WOC nurse earlier this week.  I met with nurse and patient again (per bedside nurse request) to reassure patient we have the correct orders and staff to provide the care she needs for her LLE.     MSN,RN,CWOCN, CNS, CWON-AP [phone] 

## 2019-01-12 DIAGNOSIS — I5033 Acute on chronic diastolic (congestive) heart failure: Secondary | ICD-10-CM | POA: Diagnosis not present

## 2019-01-12 DIAGNOSIS — R197 Diarrhea, unspecified: Secondary | ICD-10-CM | POA: Diagnosis not present

## 2019-01-12 DIAGNOSIS — J479 Bronchiectasis, uncomplicated: Secondary | ICD-10-CM | POA: Diagnosis not present

## 2019-01-12 DIAGNOSIS — I509 Heart failure, unspecified: Secondary | ICD-10-CM | POA: Diagnosis not present

## 2019-01-12 DIAGNOSIS — I480 Paroxysmal atrial fibrillation: Secondary | ICD-10-CM | POA: Diagnosis not present

## 2019-01-12 DIAGNOSIS — J9601 Acute respiratory failure with hypoxia: Secondary | ICD-10-CM | POA: Diagnosis not present

## 2019-01-12 DIAGNOSIS — R911 Solitary pulmonary nodule: Secondary | ICD-10-CM | POA: Diagnosis not present

## 2019-01-12 DIAGNOSIS — R279 Unspecified lack of coordination: Secondary | ICD-10-CM | POA: Diagnosis not present

## 2019-01-12 DIAGNOSIS — J9691 Respiratory failure, unspecified with hypoxia: Secondary | ICD-10-CM | POA: Diagnosis not present

## 2019-01-12 DIAGNOSIS — R2689 Other abnormalities of gait and mobility: Secondary | ICD-10-CM | POA: Diagnosis not present

## 2019-01-12 DIAGNOSIS — R0602 Shortness of breath: Secondary | ICD-10-CM | POA: Diagnosis not present

## 2019-01-12 DIAGNOSIS — N179 Acute kidney failure, unspecified: Secondary | ICD-10-CM | POA: Diagnosis not present

## 2019-01-12 DIAGNOSIS — R52 Pain, unspecified: Secondary | ICD-10-CM | POA: Diagnosis not present

## 2019-01-12 DIAGNOSIS — Z111 Encounter for screening for respiratory tuberculosis: Secondary | ICD-10-CM | POA: Diagnosis not present

## 2019-01-12 DIAGNOSIS — I10 Essential (primary) hypertension: Secondary | ICD-10-CM | POA: Diagnosis not present

## 2019-01-12 DIAGNOSIS — Z743 Need for continuous supervision: Secondary | ICD-10-CM | POA: Diagnosis not present

## 2019-01-12 DIAGNOSIS — T86821 Skin graft (allograft) (autograft) failure: Secondary | ICD-10-CM | POA: Diagnosis not present

## 2019-01-12 DIAGNOSIS — A419 Sepsis, unspecified organism: Secondary | ICD-10-CM | POA: Diagnosis not present

## 2019-01-12 DIAGNOSIS — I4891 Unspecified atrial fibrillation: Secondary | ICD-10-CM | POA: Diagnosis not present

## 2019-01-12 DIAGNOSIS — M6281 Muscle weakness (generalized): Secondary | ICD-10-CM | POA: Diagnosis not present

## 2019-01-12 DIAGNOSIS — N183 Chronic kidney disease, stage 3 (moderate): Secondary | ICD-10-CM | POA: Diagnosis not present

## 2019-01-12 DIAGNOSIS — G4733 Obstructive sleep apnea (adult) (pediatric): Secondary | ICD-10-CM | POA: Diagnosis not present

## 2019-01-12 DIAGNOSIS — R0600 Dyspnea, unspecified: Secondary | ICD-10-CM | POA: Diagnosis not present

## 2019-01-12 DIAGNOSIS — I5032 Chronic diastolic (congestive) heart failure: Secondary | ICD-10-CM | POA: Diagnosis not present

## 2019-01-12 DIAGNOSIS — K5900 Constipation, unspecified: Secondary | ICD-10-CM | POA: Diagnosis not present

## 2019-01-12 DIAGNOSIS — J449 Chronic obstructive pulmonary disease, unspecified: Secondary | ICD-10-CM | POA: Diagnosis not present

## 2019-01-12 DIAGNOSIS — J9600 Acute respiratory failure, unspecified whether with hypoxia or hypercapnia: Secondary | ICD-10-CM | POA: Diagnosis not present

## 2019-01-12 DIAGNOSIS — G459 Transient cerebral ischemic attack, unspecified: Secondary | ICD-10-CM | POA: Diagnosis not present

## 2019-01-12 LAB — CBC
HCT: 29.6 % — ABNORMAL LOW (ref 36.0–46.0)
Hemoglobin: 10.2 g/dL — ABNORMAL LOW (ref 12.0–15.0)
MCH: 31.2 pg (ref 26.0–34.0)
MCHC: 34.5 g/dL (ref 30.0–36.0)
MCV: 90.5 fL (ref 80.0–100.0)
Platelets: 440 10*3/uL — ABNORMAL HIGH (ref 150–400)
RBC: 3.27 MIL/uL — ABNORMAL LOW (ref 3.87–5.11)
RDW: 17.1 % — ABNORMAL HIGH (ref 11.5–15.5)
WBC: 7.6 10*3/uL (ref 4.0–10.5)
nRBC: 0.3 % — ABNORMAL HIGH (ref 0.0–0.2)

## 2019-01-12 LAB — CULTURE, BLOOD (ROUTINE X 2)
Culture: NO GROWTH
Special Requests: ADEQUATE

## 2019-01-12 LAB — BASIC METABOLIC PANEL
Anion gap: 11 (ref 5–15)
BUN: 34 mg/dL — ABNORMAL HIGH (ref 8–23)
CO2: 23 mmol/L (ref 22–32)
Calcium: 8.9 mg/dL (ref 8.9–10.3)
Chloride: 107 mmol/L (ref 98–111)
Creatinine, Ser: 1.33 mg/dL — ABNORMAL HIGH (ref 0.44–1.00)
GFR calc Af Amer: 42 mL/min — ABNORMAL LOW (ref >60)
GFR calc non Af Amer: 36 mL/min — ABNORMAL LOW (ref >60)
Glucose, Bld: 88 mg/dL (ref 70–99)
Potassium: 3.8 mmol/L (ref 3.5–5.1)
Sodium: 141 mmol/L (ref 135–145)

## 2019-01-12 MED ORDER — DILTIAZEM HCL ER COATED BEADS 240 MG PO CP24: 240.0000 mg | ORAL_CAPSULE | Freq: Every day | ORAL | 0 refills | Status: DC

## 2019-01-12 MED ORDER — FLUTICASONE FUROATE-VILANTEROL 200-25 MCG/INH IN AEPB: 1.0000 | INHALATION_SPRAY | Freq: Every day | RESPIRATORY_TRACT | 0 refills | Status: DC

## 2019-01-12 MED ORDER — PREDNISONE 20 MG PO TABS: 20.0000 mg | ORAL_TABLET | Freq: Every day | ORAL | 0 refills | Status: AC

## 2019-01-12 MED ORDER — APIXABAN 5 MG PO TABS: 5.0000 mg | ORAL_TABLET | Freq: Two times a day (BID) | ORAL | 0 refills | Status: DC

## 2019-01-12 NOTE — TOC Transition Note (Signed)
Transition of Care [facility]) - CM/SW Discharge Note   Patient Details  Name: Natasha Ellis MRN: [id number] Date of Birth: 1931/04/25  [facility]) CM/SW Contact:  Nila Nephew, LCSW Phone Number: 01/12/2019, 10:18 AM   Clinical Narrative:   Pt admitting to [facility] SNF - report [id number]. DC information provided. Spoke with pt's daughter who is aware. Will arrange PTAR transportation.      Barriers to Discharge: none   Patient Goals and CMS Choice Patient states their goals for this hospitalization and ongoing recovery are:: "I'm ready to go and work with therapy" CMS Medicare.gov Compare Post Acute Care list provided to:: Patient Choice offered to / list presented to : NA(pt is resident at [place] and PTA had arranged to go to SNF there)  Discharge Placement                       Discharge Plan and Services     Post Acute Care Choice: [place]                               Social Determinants of Health ([place]) Interventions     Readmission Risk Interventions Readmission Risk Prevention Plan 01/09/2019 01/09/2019  Transportation Screening Complete Complete  PCP or Specialist Appt within 3-5 Days Not Complete Not Complete  Not Complete comments not ready for d/c -  [place] or Home Care Consult Complete -  Social Work Consult for [place] Planning/Counseling Complete -  Palliative Care Screening Not Applicable -  Medication Review Press photographer) Complete -  Some recent data might be hidden

## 2019-01-12 NOTE — Care Management Important Message (Signed)
Important Message  Patient Details IM Letter given to Dessa Phi RN to present to the Patient Name: Natasha Ellis MRN: [id number] Date of Birth: Jan 08, 1931   Medicare Important Message Given:  Yes    Kerin Salen 01/12/2019, 11:53 AM

## 2019-01-12 NOTE — Discharge Summary (Signed)
Physician Discharge Summary  Natasha Ellis:[id number] DOB: 08/23/1930 DOA: 01/06/2019  PCP: Lavone Orn, MD  Admit date: 01/06/2019 Discharge date: 01/12/2019  Admitted From: [facility] Disposition:  [facility]  Recommendations for Outpatient Follow-up:  1. Follow up with PCP in 1 week 2. Please obtain BMP in one week 3. Newly started on diltiazem, apixaban for A. fib with RVR.  Also started on Breo Ellipta inhaler for COPD exacerbation. 4. Consider repeat CT chest in 3 months for left lower lobe nodule  Home Health: N/A Equipment/Devices: None  Discharge Condition: Stable CODE STATUS: DNR Diet recommendation: Heart Healthy  History of present illness:  85 year old female who presented with fever and dyspnea. She does have significant past medical history for asthma, non-Hodgkin's lymphoma, Waldenstrm macroglobulinemia, bronchiectasis, arthritis and obstructive sleep apnea. Patient reported 7 days of fever, and persistent dyspnea. 4 weeks ago she had a viral illness which lasted for about 2 weeks, tested negative for COVID-19 back pain, she was left with persistent dyspnea since then.  On her initial initial physical examination her temperature was 100.5, pressure 115/74, heart rate 139, respiratory rate 30, oxygen saturation 98%, she was lethargic, had dry mucous membranes, her lungs had no wheezing or rails, heart S1-S2 present, tachycardic, irregularly irregular, abdomen soft nontender, no lower extremity edema. Sodium 134, potassium 4.5, chloride 103, bicarb 19, glucose 99, BUN 25, creatinine 1.35, white count 7.1, hemoglobin 10.2, hematocrit 28.4, platelets 316 d-dimer 4.1, fibrinogen 258, INR 1.5.SARS COVID-19 negative, urinalysis negative for infection. Chest radiograph with bibasilar atelectasis more right than left.Chest CT with no pulmonary embolism, no evident infiltrate, positive bronchiectasis. #1EKG 83 bpm, normal axis, right bundle branch block, sinus  rhythm, T wave inversions V1 to V4.#2EKG 150 bpm, normal axis, right bundle branch block, positive PVC,suspected SVT,persistent T wave inversions in the anterior septal V1 to V4 and new lateral ST depressions,V5 and V6.  Patient was admitted to the hospital working diagnosis ofsuspectedsepsis complicated by atrial fibrillation with rapid ventricular response.  Hospital course:  New onset paroxysmal atrial fibrillation acute on chronic diastolic heart failure acute hypoxic respiratory failure  CT angio PE study notable for cardiomegaly, main pulmonary artery dilation, interval increase in left lower lobe pulmonary nodule 1.7 cm, hepatic steatosis and no large centrally located pulmonary emboli.  Patient initially required Cardizem drip which has been titrated off.  Continues with atrial fibrillation, rate controlled on telemetry.  Received 2 doses of IV furosemide 40mg  on 6/6 and 6/7.  Patient was transitioned to Cardizem 240 mg p.o. daily and started on chronic anticoagulation with apixaban 5 mg p.o. twice daily.  Supplemental oxygen has been titrated off.  May resume home furosemide 20 mg p.o. daily with potassium supplementation.  Recommend repeat BMP in 1 week at PCP visit.  Patient instructed to maintain daily weights.  Bronchiectasis with acute exacerbation. Patient presenting with acute hypoxic respiratory failure likely secondary to bronchiectasis versus COPD exacerbation.  Initially requiring supplemental oxygen, now titrated off.  IV steroids transition to oral prednisone 20 mg p.o. daily, will complete an additional 2 days.  Started on [employer] inhaler.  Left leg skin graft. Continue dressing changes weekly.   Morbid obesity BMI is 45.8, discussed with patient regarding need for aggressive weight loss measures as this complicates all facets of care.  New AKI on CKD stage 3b.  Baseline creatinine over the past year 1.1-1.2.  Creatinine on admission 1.35, elevated to  1.66 following IV diuresis as above.  Creatinine at time of discharge  1.33.  Recommend repeat BMP in next PCP visit.  Left lower lobe lung nodule Noted slight interval increase in left lower lobe pulmonary nodule, up to 1.7 cm now. Radiology recommends repeat CT chest in 3 months for further evaluation/monitoring  Discharge Diagnoses:  Active Problems:   Asthma   OSA (obstructive sleep apnea)   CKD (chronic kidney disease) stage 3, GFR 30-59 ml/min [facility])    Discharge Instructions  Discharge Instructions    (HEART FAILURE PATIENTS) Call MD:  Anytime you have any of the following symptoms: 1) 3 pound weight gain in 24 hours or 5 pounds in 1 week 2) shortness of breath, with or without a dry hacking cough 3) swelling in the hands, feet or stomach 4) if you have to sleep on extra pillows at night in order to breathe.   Complete by: As directed    Call MD for:  difficulty breathing, headache or visual disturbances   Complete by: As directed    Call MD for:  extreme fatigue   Complete by: As directed    Call MD for:  persistant dizziness or light-headedness   Complete by: As directed    Call MD for:  persistant nausea and vomiting   Complete by: As directed    Call MD for:  severe uncontrolled pain   Complete by: As directed    Call MD for:  temperature >100.4   Complete by: As directed    Diet - low sodium heart healthy   Complete by: As directed    Increase activity slowly   Complete by: As directed      Allergies as of 01/12/2019      Reactions   Codeine Other (See Comments)   Crazy-nightmares   Amoxicillin Diarrhea   Has patient had a PCN reaction causing immediate rash, facial/tongue/throat swelling, SOB or lightheadedness with hypotension:  No Has patient had a PCN reaction causing severe rash involving mucus membranes or skin necrosis: No Has patient had a PCN reaction that required hospitalization No Has patient had a PCN reaction occurring within the last 10 years:  Unknown--rx is diarrhea. If all of the above answers are "NO", then may proceed with Cephalosporin use.   Cefdinir Cough   Doxycycline Diarrhea   Erythromycin Other (See Comments)   GI upset   Lisinopril Cough   Oxybutynin Other (See Comments)   Dry mouth   Sulfa Antibiotics Other (See Comments)   GI upset      Medication List    STOP taking these medications   fluticasone 50 MCG/ACT nasal spray Commonly known as: FLONASE     TAKE these medications   acetaminophen 500 MG tablet Commonly known as: TYLENOL Take 1,000 mg by mouth every 6 (six) hours as needed for pain.   albuterol (2.5 MG/3ML) 0.083% nebulizer solution Commonly known as: PROVENTIL Take 3 mLs (2.5 mg total) by nebulization every 4 (four) hours as needed for wheezing or shortness of breath.   albuterol 108 (90 Base) MCG/ACT inhaler Commonly known as: ProAir HFA Inhale 2 puffs into the lungs every 6 (six) hours as needed for wheezing or shortness of breath.   apixaban 5 MG Tabs tablet Commonly known as: ELIQUIS Take 1 tablet (5 mg total) by mouth 2 (two) times daily.   calcium gluconate 500 MG tablet Take 1 tablet by mouth daily.   clopidogrel 75 MG tablet Commonly known as: PLAVIX TAKE 1 TABLET BY MOUTH  DAILY   diltiazem 240 MG 24 hr capsule Commonly  known as: CARDIZEM CD Take 1 capsule (240 mg total) by mouth daily for 30 days. Start taking on: January 13, 2019   ferrous sulfate 325 (65 FE) MG tablet Take 325 mg by mouth daily.   fluticasone furoate-vilanterol 200-25 MCG/INH Aepb Commonly known as: BREO ELLIPTA Inhale 1 puff into the lungs daily.   Flutter Devi Use as directed.   furosemide 20 MG tablet Commonly known as: LASIX Take 20 mg by mouth daily.   potassium chloride 10 MEQ tablet Commonly known as: K-DUR Take 10 mEq by mouth daily.   predniSONE 20 MG tablet Commonly known as: DELTASONE Take 1 tablet (20 mg total) by mouth daily with breakfast for 2 days. Start taking on: January 13, 2019   sodium chloride 0.65 % Soln nasal spray Commonly known as: OCEAN Place 1 spray into both nostrils 3 (three) times daily as needed for congestion.       Contact information for follow-up providers    Lavone Orn, MD. Schedule an appointment as soon as possible for a visit in 1 week(s).   Specialty: Internal Medicine Contact information: [address] [place] [address] [phone]            Contact information for after-discharge care    Destination    [facility] .   Service: Skilled Nursing Contact information: [address] [place] [phone]                 Allergies  Allergen Reactions  . Codeine Other (See Comments)    Crazy-nightmares  . Amoxicillin Diarrhea    Has patient had a PCN reaction causing immediate rash, facial/tongue/throat swelling, SOB or lightheadedness with hypotension:  No Has patient had a PCN reaction causing severe rash involving mucus membranes or skin necrosis: No Has patient had a PCN reaction that required hospitalization No Has patient had a PCN reaction occurring within the last 10 years: Unknown--rx is diarrhea. If all of the above answers are "NO", then may proceed with Cephalosporin use.    . Cefdinir Cough  . Doxycycline Diarrhea  . Erythromycin Other (See Comments)    GI upset  . Lisinopril Cough  . Oxybutynin Other (See Comments)    Dry mouth  . Sulfa Antibiotics Other (See Comments)    GI upset    Consultations:  none   Procedures/Studies: Dg Chest 1 View  Result Date: 01/07/2019 CLINICAL DATA:  Patient with shortness of breath. EXAM: CHEST  1 VIEW COMPARISON:  Chest radiograph 01/06/2019 FINDINGS: Monitoring leads overlie the patient. Stable cardiac and mediastinal contours. Low lung volumes. Bibasilar heterogeneous pulmonary opacities. No pleural effusion or pneumothorax. IMPRESSION: Bibasilar opacities favored to represent  atelectasis. Cardiomegaly. Electronically Signed   By: Lovey Newcomer M.D.   On: 01/07/2019 10:01   Dg Chest 2 View  Result Date: 01/09/2019 CLINICAL DATA:  Shortness of breath EXAM: CHEST - 2 VIEW COMPARISON:  01/07/2019 FINDINGS: Mild cardiomegaly and vascular congestion. Bibasilar atelectasis. Small bilateral effusions. No overt edema. No acute bony abnormality. IMPRESSION: Mild cardiomegaly and vascular congestion. Small effusions with bibasilar atelectasis. Electronically Signed   By: Rolm Baptise M.D.   On: 01/09/2019 11:31   Ct Angio Chest Pe W/cm &/or Wo Cm  Result Date: 01/07/2019 CLINICAL DATA:  Shortness of breath. EXAM: CT ANGIOGRAPHY CHEST WITH CONTRAST TECHNIQUE: Multidetector CT imaging of the chest was performed using the standard protocol during bolus administration of intravenous contrast. Multiplanar CT image reconstructions and  MIPs were obtained to evaluate the vascular anatomy. CONTRAST:  76mL OMNIPAQUE IOHEXOL 350 MG/ML SOLN COMPARISON:  None. FINDINGS: Cardiovascular: Examination for pulmonary emboli is limited by suboptimal contrast bolus timing and significant motion artifact. Given this limitation, no definite PE was identified. The heart size is enlarged. The main pulmonary artery is dilated measuring approximately 3.8 cm. Coronary artery calcifications are noted. Reflux of contrast into the IVC is noted. Mediastinum/Nodes: No enlarged mediastinal, hilar, or axillary lymph nodes. Thyroid gland, trachea, and esophagus demonstrate no significant findings. Lungs/Pleura: There is an apparent 1.7 cm pulmonary nodule at the left lung base, increased in size from prior study. There is scattered areas of atelectasis. The lung parenchyma is not well evaluated secondary to extensive motion artifact. There is no pneumothorax. No large pleural effusion. The trachea is unremarkable. The lung volumes are low. Upper Abdomen: There is likely underlying hepatic steatosis. The spleen is enlarged.  Musculoskeletal: No chest wall abnormality. No acute or significant osseous findings. Review of the MIP images confirms the above findings. IMPRESSION: 1. Very limited study secondary to suboptimal contrast bolus timing and extensive motion artifact. There is no large centrally located pulmonary embolus. Detection of smaller pulmonary emboli is very limited. 2. Cardiomegaly. There is reflux of contrast into the IVC consistent with underlying cardiac dysfunction. 3. The main pulmonary artery is dilated which can be seen in patients with elevated PA pressures. 4. Apparent interval increase in size of a 1.7 cm left lower lobe pulmonary nodule. However, this is not well evaluated secondary to motion artifact. Consider a three-month follow-up CT chest for further evaluation. 5. Probable hepatic steatosis. 6. Probable splenomegaly. Aortic Atherosclerosis (ICD10-I70.0). Electronically Signed   By: [name] M.D.   On: 01/07/2019 01:12   Dg Chest Port 1 View  Result Date: 01/06/2019 CLINICAL DATA:  Shortness of breath and fever x3 weeks. EXAM: PORTABLE CHEST 1 VIEW COMPARISON:  09/12/2018 FINDINGS: The cardiac silhouette is stable. There is no pneumothorax. No large pleural effusion. There may be some mild volume overload. No large focal area of consolidation. There are few airspace opacities at the lung base is favored to represent atelectasis. There is no acute osseous abnormality. IMPRESSION: Enlarged heart with borderline volume overload. No focal area of consolidation. There are few scattered airspace opacities at the lung base is favored to represent atelectasis. Electronically Signed   By: [name] M.D.   On: 01/06/2019 19:52      Subjective: Patient seen and examined at bedside, resting comfortably.  Eating breakfast.  No complaints this morning.  Heart rate has been well controlled over the past 24 hours.  Ready for discharge to [facility].  Denies headache, fever/chills/night sweats,  no nausea/vomiting/diarrhea, no chest pain, palpitations, no shortness of breath, no abdominal pain, no issues with bowel/bladder function, no cough/congestion, no paresthesias.  No acute complaints overnight per nursing staff.   Discharge Exam: Vitals:   01/11/19 2224 01/12/19 0625  BP: (!) 157/86 (!) 149/92  Pulse: 91 79  Resp: 18 16  Temp: 97.8 F (36.6 C) 97.9 F (36.6 C)  SpO2: 97% 97%   Vitals:   01/11/19 1725 01/11/19 1945 01/11/19 2224 01/12/19 0625  BP: 127/78  (!) 157/86 (!) 149/92  Pulse: 93  91 79  Resp: 19  18 16   Temp: 97.7 F (36.5 C)  97.8 F (36.6 C) 97.9 F (36.6 C)  TempSrc:    Oral  SpO2: 95% 94% 97% 97%  Weight:      Height:  General: Pt is alert, awake, not in acute distress, obese Cardiovascular: Irregularly irregular rhythm, normal rate,, S1/S2 +, no rubs, no gallops Respiratory: CTA bilaterally, no wheezing, no rhonchi Abdominal: Soft, NT, ND, bowel sounds + Extremities: no edema, no cyanosis    The results of significant diagnostics from this hospitalization (including imaging, microbiology, ancillary and laboratory) are listed below for reference.     Microbiology: Recent Results (from the past 240 hour(s))  SARS Coronavirus 2 (CEPHEID- Performed in [facility] hospital lab), Hosp Order     Status: None   Collection Time: 01/06/19  6:23 PM   Specimen: Nasopharyngeal Swab  Result Value Ref Range Status   SARS Coronavirus 2 NEGATIVE NEGATIVE Final    Comment: (NOTE) If result is NEGATIVE SARS-CoV-2 target nucleic acids are NOT DETECTED. The SARS-CoV-2 RNA is generally detectable in upper and lower  respiratory specimens during the acute phase of infection. The lowest  concentration of SARS-CoV-2 viral copies this assay can detect is 250  copies / mL. A negative result does not preclude SARS-CoV-2 infection  and should not be used as the sole basis for treatment or other  patient management decisions.  A negative result may occur  with  improper specimen collection / handling, submission of specimen other  than nasopharyngeal swab, presence of viral mutation(s) within the  areas targeted by this assay, and inadequate number of viral copies  (<250 copies / mL). A negative result must be combined with clinical  observations, patient history, and epidemiological information. If result is POSITIVE SARS-CoV-2 target nucleic acids are DETECTED. The SARS-CoV-2 RNA is generally detectable in upper and lower  respiratory specimens dur ing the acute phase of infection.  Positive  results are indicative of active infection with SARS-CoV-2.  Clinical  correlation with patient history and other diagnostic information is  necessary to determine patient infection status.  Positive results do  not rule out bacterial infection or co-infection with other viruses. If result is PRESUMPTIVE POSTIVE SARS-CoV-2 nucleic acids MAY BE PRESENT.   A presumptive positive result was obtained on the submitted specimen  and confirmed on repeat testing.  While 2019 novel coronavirus  (SARS-CoV-2) nucleic acids may be present in the submitted sample  additional confirmatory testing may be necessary for epidemiological  and / or clinical management purposes  to differentiate between  SARS-CoV-2 and other Sarbecovirus currently known to infect humans.  If clinically indicated additional testing with an alternate test  methodology [phone]) is advised. The SARS-CoV-2 RNA is generally  detectable in upper and lower respiratory sp ecimens during the acute  phase of infection. The expected result is Negative. Fact Sheet for Patients:  StrictlyIdeas.no Fact Sheet for Healthcare Providers: BankingDealers.co.za This test is not yet approved or cleared by the [place] FDA and has been authorized for detection and/or diagnosis of SARS-CoV-2 by FDA under an Emergency Use Authorization (EUA).  This EUA  will remain in effect (meaning this test can be used) for the duration of the COVID-19 declaration under Section 564(b)(1) of the Act, 21 U.S.C. section 360bbb-3(b)(1), unless the authorization is terminated or revoked sooner. Performed at [facility], [place] [address], [place], [place] [address]   Culture, blood (routine x 2)     Status: None   Collection Time: 01/06/19  6:23 PM   Specimen: BLOOD  Result Value Ref Range Status   Specimen Description   Final    BLOOD LEFT ANTECUBITAL Performed at [facility] [address]  [place], [place] [address]    Special Requests   Final    BOTTLES DRAWN AEROBIC AND ANAEROBIC Blood Culture results may not be optimal due to an inadequate volume of blood received in culture bottles Performed at [facility] [address] [place], [place] [address]    Culture   Final    NO GROWTH 5 DAYS Performed at [facility], [place] [address], [place], [place] [address]    Report Status 01/11/2019 FINAL  Final  Urine culture     Status: Abnormal   Collection Time: 01/06/19  6:24 PM   Specimen: Urine, Random  Result Value Ref Range Status   Specimen Description   Final    URINE, RANDOM Performed at [facility] [address] [place], [place] [address]    Special Requests   Final    Immunocompromised Performed at [facility], [place] [address], [place], [place] [address]    Culture (A)  Final    <10,000 COLONIES/mL INSIGNIFICANT GROWTH Performed at [facility] [address], [place], [place] [address]    Report Status 01/08/2019 FINAL  Final  SARS Coronavirus 2 (CEPHEID- Performed in [place] hospital lab), Hosp Order     Status: None   Collection Time: 01/07/19  3:43 AM   Specimen: Nasopharyngeal Swab  Result Value Ref Range Status   SARS Coronavirus 2 NEGATIVE NEGATIVE Final    Comment: (NOTE) If result is NEGATIVE SARS-CoV-2  target nucleic acids are NOT DETECTED. The SARS-CoV-2 RNA is generally detectable in upper and lower  respiratory specimens during the acute phase of infection. The lowest  concentration of SARS-CoV-2 viral copies this assay can detect is 250  copies / mL. A negative result does not preclude SARS-CoV-2 infection  and should not be used as the sole basis for treatment or other  patient management decisions.  A negative result may occur with  improper specimen collection / handling, submission of specimen other  than nasopharyngeal swab, presence of viral mutation(s) within the  areas targeted by this assay, and inadequate number of viral copies  (<250 copies / mL). A negative result must be combined with clinical  observations, patient history, and epidemiological information. If result is POSITIVE SARS-CoV-2 target nucleic acids are DETECTED. The SARS-CoV-2 RNA is generally detectable in upper and lower  respiratory specimens dur ing the acute phase of infection.  Positive  results are indicative of active infection with SARS-CoV-2.  Clinical  correlation with patient history and other diagnostic information is  necessary to determine patient infection status.  Positive results do  not rule out bacterial infection or co-infection with other viruses. If result is PRESUMPTIVE POSTIVE SARS-CoV-2 nucleic acids MAY BE PRESENT.   A presumptive positive result was obtained on the submitted specimen  and confirmed on repeat testing.  While 2019 novel coronavirus  (SARS-CoV-2) nucleic acids may be present in the submitted sample  additional confirmatory testing may be necessary for epidemiological  and / or clinical management purposes  to differentiate between  SARS-CoV-2 and other Sarbecovirus currently known to infect humans.  If clinically indicated additional testing with an alternate test  methodology [phone]) is advised. The SARS-CoV-2 RNA is generally  detectable in upper and lower  respiratory sp ecimens during the acute  phase of infection. The expected result is Negative. Fact Sheet for Patients:  StrictlyIdeas.no Fact Sheet for Healthcare Providers: BankingDealers.co.za This test is not yet approved or cleared by the [place]  States FDA and has been authorized for detection and/or diagnosis of SARS-CoV-2 by FDA under an Emergency Use Authorization (EUA).  This EUA will remain in effect (meaning this test can be used) for the duration of the COVID-19 declaration under Section 564(b)(1) of the Act, 21 U.S.C. section 360bbb-3(b)(1), unless the authorization is terminated or revoked sooner. Performed at [facility], [place] [address], [place], [place] [address]   MRSA PCR Screening     Status: None   Collection Time: 01/07/19  5:44 AM   Specimen: Nasal Mucosa; Nasopharyngeal  Result Value Ref Range Status   MRSA by PCR NEGATIVE NEGATIVE Final    Comment:        The GeneXpert MRSA Assay (FDA approved for NASAL specimens only), is one component of a comprehensive MRSA colonization surveillance program. It is not intended to diagnose MRSA infection nor to guide or monitor treatment for MRSA infections. Performed at [facility], [place] [address], [place], [place] [address]   Culture, blood (routine x 2)     Status: None (Preliminary result)   Collection Time: 01/07/19  8:07 AM   Specimen: BLOOD LEFT HAND  Result Value Ref Range Status   Specimen Description   Final    BLOOD LEFT HAND Performed at [facility] [address] [place], [place] [address]    Special Requests   Final    BOTTLES DRAWN AEROBIC AND ANAEROBIC Blood Culture adequate volume Performed at [facility] [address], [place], [place] [address]    Culture   Final    NO GROWTH 4 DAYS Performed at [facility], [place] [address] [place], [place] [address]     Report Status PENDING  Incomplete     Labs: BNP (last 3 results) Recent Labs    01/06/19 1822  BNP 542.7*   Basic Metabolic Panel: Recent Labs  Lab 01/08/19 0034 01/09/19 0246 01/10/19 0217 01/11/19 0311 01/12/19 0848  NA 136 137 138 140 141  K 4.0 4.1 4.2 4.7 3.8  CL 106 106 108 109 107  CO2 21* 21* 20* 21* 23  GLUCOSE 104* 90 146* 130* 88  BUN 24* 32* 33* 36* 34*  CREATININE 1.57* 1.66* 1.30* 1.27* 1.33*  CALCIUM 8.5* 8.4* 8.9 9.1 8.9   Liver Function Tests: Recent Labs  Lab 01/06/19 1822  AST 25  ALT 15  ALKPHOS 94  BILITOT 1.0  PROT 6.9  ALBUMIN 3.3*   No results for input(s): LIPASE, AMYLASE in the last 168 hours. No results for input(s): AMMONIA in the last 168 hours. CBC: Recent Labs  Lab 01/06/19 1822 01/07/19 0807 01/08/19 0034 01/09/19 0246 01/12/19 0848  WBC 7.1 7.7 8.4 5.7 7.6  NEUTROABS 5.6  --   --  4.2  --   HGB 10.2* 9.6* 9.2* 9.2* 10.2*  HCT 28.4* 27.3* 26.0* 26.2* 29.6*  MCV 91.9 91.9 90.9 92.3 90.5  PLT 316 310 333 333 440*   Cardiac Enzymes: Recent Labs  Lab 01/06/19 1822  TROPONINI <0.03   BNP: Invalid input(s): POCBNP CBG: Recent Labs  Lab 01/09/19 2001  GLUCAP 171*   D-Dimer No results for input(s): DDIMER in the last 72 hours. Hgb A1c No results for input(s): HGBA1C in the last 72 hours. Lipid Profile No results for input(s): CHOL, HDL, LDLCALC, TRIG, CHOLHDL, LDLDIRECT in the last 72 hours. Thyroid function studies No results for input(s): TSH, T4TOTAL, T3FREE, THYROIDAB in the last 72 hours.  Invalid input(s):  FREET3 Anemia work up No results for input(s): VITAMINB12, FOLATE, FERRITIN, TIBC, IRON, RETICCTPCT in the last 72 hours. Urinalysis    Component Value Date/Time   COLORURINE AMBER (A) 01/06/2019 1823   APPEARANCEUR CLOUDY (A) 01/06/2019 1823   LABSPEC 1.016 01/06/2019 1823   PHURINE 5.0 01/06/2019 1823   GLUCOSEU NEGATIVE 01/06/2019 1823   HGBUR NEGATIVE 01/06/2019 1823   BILIRUBINUR NEGATIVE  01/06/2019 1823   BILIRUBINUR neg 04/20/2013 1728   KETONESUR NEGATIVE 01/06/2019 1823   PROTEINUR NEGATIVE 01/06/2019 1823   UROBILINOGEN 0.2 05/21/2015 1920   NITRITE NEGATIVE 01/06/2019 1823   LEUKOCYTESUR NEGATIVE 01/06/2019 1823   Sepsis Labs Invalid input(s): PROCALCITONIN,  WBC,  LACTICIDVEN Microbiology Recent Results (from the past 240 hour(s))  SARS Coronavirus 2 (CEPHEID- Performed in [place] hospital lab), Hosp Order     Status: None   Collection Time: 01/06/19  6:23 PM   Specimen: Nasopharyngeal Swab  Result Value Ref Range Status   SARS Coronavirus 2 NEGATIVE NEGATIVE Final    Comment: (NOTE) If result is NEGATIVE SARS-CoV-2 target nucleic acids are NOT DETECTED. The SARS-CoV-2 RNA is generally detectable in upper and lower  respiratory specimens during the acute phase of infection. The lowest  concentration of SARS-CoV-2 viral copies this assay can detect is 250  copies / mL. A negative result does not preclude SARS-CoV-2 infection  and should not be used as the sole basis for treatment or other  patient management decisions.  A negative result may occur with  improper specimen collection / handling, submission of specimen other  than nasopharyngeal swab, presence of viral mutation(s) within the  areas targeted by this assay, and inadequate number of viral copies  (<250 copies / mL). A negative result must be combined with clinical  observations, patient history, and epidemiological information. If result is POSITIVE SARS-CoV-2 target nucleic acids are DETECTED. The SARS-CoV-2 RNA is generally detectable in upper and lower  respiratory specimens dur ing the acute phase of infection.  Positive  results are indicative of active infection with SARS-CoV-2.  Clinical  correlation with patient history and other diagnostic information is  necessary to determine patient infection status.  Positive results do  not rule out bacterial infection or co-infection with  other viruses. If result is PRESUMPTIVE POSTIVE SARS-CoV-2 nucleic acids MAY BE PRESENT.   A presumptive positive result was obtained on the submitted specimen  and confirmed on repeat testing.  While 2019 novel coronavirus  (SARS-CoV-2) nucleic acids may be present in the submitted sample  additional confirmatory testing may be necessary for epidemiological  and / or clinical management purposes  to differentiate between  SARS-CoV-2 and other Sarbecovirus currently known to infect humans.  If clinically indicated additional testing with an alternate test  methodology [phone]) is advised. The SARS-CoV-2 RNA is generally  detectable in upper and lower respiratory sp ecimens during the acute  phase of infection. The expected result is Negative. Fact Sheet for Patients:  StrictlyIdeas.no Fact Sheet for Healthcare Providers: BankingDealers.co.za This test is not yet approved or cleared by the [place] FDA and has been authorized for detection and/or diagnosis of SARS-CoV-2 by FDA under an Emergency Use Authorization (EUA).  This EUA will remain in effect (meaning this test can be used) for the duration of the COVID-19 declaration under Section 564(b)(1) of the Act, 21 U.S.C. section 360bbb-3(b)(1), unless the authorization is terminated or revoked sooner. Performed at [facility], [place] [address], [place], [place] [address]   Culture, blood (  routine x 2)     Status: None   Collection Time: 01/06/19  6:23 PM   Specimen: BLOOD  Result Value Ref Range Status   Specimen Description   Final    BLOOD LEFT ANTECUBITAL Performed at [facility] [address] [place], [place] [address]    Special Requests   Final    BOTTLES DRAWN AEROBIC AND ANAEROBIC Blood Culture results may not be optimal due to an inadequate volume of blood received in culture bottles Performed at [facility] [address], [place], [place] [address]    Culture   Final    NO GROWTH 5 DAYS Performed at [facility], [place] [address], [place], [place] [address]    Report Status 01/11/2019 FINAL  Final  Urine culture     Status: Abnormal   Collection Time: 01/06/19  6:24 PM   Specimen: Urine, Random  Result Value Ref Range Status   Specimen Description   Final    URINE, RANDOM Performed at [facility] [address], [place], [place] [address]    Special Requests   Final    Immunocompromised Performed at [facility], [place] [address], [place], [place] [address]    Culture (A)  Final    <10,000 COLONIES/mL INSIGNIFICANT GROWTH Performed at [facility] [address], [place], [place] [address]    Report Status 01/08/2019 FINAL  Final  SARS Coronavirus 2 (CEPHEID- Performed in [place] hospital lab), Hosp Order     Status: None   Collection Time: 01/07/19  3:43 AM   Specimen: Nasopharyngeal Swab  Result Value Ref Range Status   SARS Coronavirus 2 NEGATIVE NEGATIVE Final    Comment: (NOTE) If result is NEGATIVE SARS-CoV-2 target nucleic acids are NOT DETECTED. The SARS-CoV-2 RNA is generally detectable in upper and lower  respiratory specimens during the acute phase of infection. The lowest  concentration of SARS-CoV-2 viral copies this assay can detect is 250  copies / mL. A negative result does not preclude SARS-CoV-2 infection  and should not be used as the sole basis for treatment or other  patient management decisions.  A negative result may occur with  improper specimen collection / handling, submission of specimen other  than nasopharyngeal swab, presence of viral mutation(s) within the  areas targeted by this assay, and inadequate number of viral copies  (<250 copies / mL). A negative result must be combined with clinical  observations, patient history, and epidemiological information. If result is POSITIVE SARS-CoV-2  target nucleic acids are DETECTED. The SARS-CoV-2 RNA is generally detectable in upper and lower  respiratory specimens dur ing the acute phase of infection.  Positive  results are indicative of active infection with SARS-CoV-2.  Clinical  correlation with patient history and other diagnostic information is  necessary to determine patient infection status.  Positive results do  not rule out bacterial infection or co-infection with other viruses. If result is PRESUMPTIVE POSTIVE SARS-CoV-2 nucleic acids MAY BE PRESENT.   A presumptive positive result was obtained on the submitted specimen  and confirmed on repeat testing.  While 2019 novel coronavirus  (SARS-CoV-2) nucleic acids may be present in the submitted sample  additional confirmatory testing may be necessary for epidemiological  and / or clinical management purposes  to differentiate between  SARS-CoV-2 and other Sarbecovirus currently known to infect humans.  If clinically indicated additional testing with an alternate test  methodology [phone]) is  advised. The SARS-CoV-2 RNA is generally  detectable in upper and lower respiratory sp ecimens during the acute  phase of infection. The expected result is Negative. Fact Sheet for Patients:  StrictlyIdeas.no Fact Sheet for Healthcare Providers: BankingDealers.co.za This test is not yet approved or cleared by the [place] FDA and has been authorized for detection and/or diagnosis of SARS-CoV-2 by FDA under an Emergency Use Authorization (EUA).  This EUA will remain in effect (meaning this test can be used) for the duration of the COVID-19 declaration under Section 564(b)(1) of the Act, 21 U.S.C. section 360bbb-3(b)(1), unless the authorization is terminated or revoked sooner. Performed at [facility], [place] [address], [place], [place] [address]   MRSA PCR Screening     Status: None   Collection Time: 01/07/19   5:44 AM   Specimen: Nasal Mucosa; Nasopharyngeal  Result Value Ref Range Status   MRSA by PCR NEGATIVE NEGATIVE Final    Comment:        The GeneXpert MRSA Assay (FDA approved for NASAL specimens only), is one component of a comprehensive MRSA colonization surveillance program. It is not intended to diagnose MRSA infection nor to guide or monitor treatment for MRSA infections. Performed at [facility], [place] [address] [place], [place] [address]   Culture, blood (routine x 2)     Status: None (Preliminary result)   Collection Time: 01/07/19  8:07 AM   Specimen: BLOOD LEFT HAND  Result Value Ref Range Status   Specimen Description   Final    BLOOD LEFT HAND Performed at [facility] [address], [place], [place] [address]    Special Requests   Final    BOTTLES DRAWN AEROBIC AND ANAEROBIC Blood Culture adequate volume Performed at [facility] [address], [place], [place] [address]    Culture   Final    NO GROWTH 4 DAYS Performed at [facility], [place] [address] [place], [place] [address]    Report Status PENDING  Incomplete     Time coordinating discharge: Over 30 minutes  SIGNED:    J [place] DO  [facility] 01/12/2019, 10:07 AM

## 2019-01-12 NOTE — Progress Notes (Signed)
Attempted to call report twice to [facility] facility. No one answered the phone.

## 2019-01-13 DIAGNOSIS — T86821 Skin graft (allograft) (autograft) failure: Secondary | ICD-10-CM | POA: Diagnosis not present

## 2019-01-13 DIAGNOSIS — I5032 Chronic diastolic (congestive) heart failure: Secondary | ICD-10-CM | POA: Diagnosis not present

## 2019-01-13 DIAGNOSIS — I480 Paroxysmal atrial fibrillation: Secondary | ICD-10-CM | POA: Diagnosis not present

## 2019-01-13 DIAGNOSIS — M6281 Muscle weakness (generalized): Secondary | ICD-10-CM | POA: Diagnosis not present

## 2019-01-13 DIAGNOSIS — R911 Solitary pulmonary nodule: Secondary | ICD-10-CM | POA: Diagnosis not present

## 2019-01-13 DIAGNOSIS — N183 Chronic kidney disease, stage 3 (moderate): Secondary | ICD-10-CM | POA: Diagnosis not present

## 2019-01-13 DIAGNOSIS — J479 Bronchiectasis, uncomplicated: Secondary | ICD-10-CM | POA: Diagnosis not present

## 2019-01-13 DIAGNOSIS — R52 Pain, unspecified: Secondary | ICD-10-CM | POA: Diagnosis not present

## 2019-01-13 DIAGNOSIS — G459 Transient cerebral ischemic attack, unspecified: Secondary | ICD-10-CM | POA: Diagnosis not present

## 2019-01-13 DIAGNOSIS — G4733 Obstructive sleep apnea (adult) (pediatric): Secondary | ICD-10-CM | POA: Diagnosis not present

## 2019-01-13 DIAGNOSIS — J9601 Acute respiratory failure with hypoxia: Secondary | ICD-10-CM | POA: Diagnosis not present

## 2019-01-16 DIAGNOSIS — G459 Transient cerebral ischemic attack, unspecified: Secondary | ICD-10-CM | POA: Diagnosis not present

## 2019-01-16 DIAGNOSIS — I480 Paroxysmal atrial fibrillation: Secondary | ICD-10-CM | POA: Diagnosis not present

## 2019-01-16 DIAGNOSIS — M6281 Muscle weakness (generalized): Secondary | ICD-10-CM | POA: Diagnosis not present

## 2019-01-16 DIAGNOSIS — J479 Bronchiectasis, uncomplicated: Secondary | ICD-10-CM | POA: Diagnosis not present

## 2019-01-16 DIAGNOSIS — R911 Solitary pulmonary nodule: Secondary | ICD-10-CM | POA: Diagnosis not present

## 2019-01-16 DIAGNOSIS — T86821 Skin graft (allograft) (autograft) failure: Secondary | ICD-10-CM | POA: Diagnosis not present

## 2019-01-16 DIAGNOSIS — N183 Chronic kidney disease, stage 3 (moderate): Secondary | ICD-10-CM | POA: Diagnosis not present

## 2019-01-16 DIAGNOSIS — J9601 Acute respiratory failure with hypoxia: Secondary | ICD-10-CM | POA: Diagnosis not present

## 2019-01-16 DIAGNOSIS — I5032 Chronic diastolic (congestive) heart failure: Secondary | ICD-10-CM | POA: Diagnosis not present

## 2019-01-16 DIAGNOSIS — G4733 Obstructive sleep apnea (adult) (pediatric): Secondary | ICD-10-CM | POA: Diagnosis not present

## 2019-01-16 DIAGNOSIS — R52 Pain, unspecified: Secondary | ICD-10-CM | POA: Diagnosis not present

## 2019-01-17 ENCOUNTER — Inpatient Hospital Stay: Payer: Medicare Other | Admitting: Oncology

## 2019-01-17 ENCOUNTER — Inpatient Hospital Stay: Payer: Medicare Other

## 2019-01-20 DIAGNOSIS — G459 Transient cerebral ischemic attack, unspecified: Secondary | ICD-10-CM | POA: Diagnosis not present

## 2019-01-20 DIAGNOSIS — R197 Diarrhea, unspecified: Secondary | ICD-10-CM | POA: Diagnosis not present

## 2019-01-20 DIAGNOSIS — I480 Paroxysmal atrial fibrillation: Secondary | ICD-10-CM | POA: Diagnosis not present

## 2019-01-20 DIAGNOSIS — I10 Essential (primary) hypertension: Secondary | ICD-10-CM | POA: Diagnosis not present

## 2019-01-20 DIAGNOSIS — I5033 Acute on chronic diastolic (congestive) heart failure: Secondary | ICD-10-CM | POA: Diagnosis not present

## 2019-01-20 DIAGNOSIS — M6281 Muscle weakness (generalized): Secondary | ICD-10-CM | POA: Diagnosis not present

## 2019-01-20 DIAGNOSIS — J479 Bronchiectasis, uncomplicated: Secondary | ICD-10-CM | POA: Diagnosis not present

## 2019-01-20 DIAGNOSIS — N183 Chronic kidney disease, stage 3 (moderate): Secondary | ICD-10-CM | POA: Diagnosis not present

## 2019-01-20 DIAGNOSIS — R911 Solitary pulmonary nodule: Secondary | ICD-10-CM | POA: Diagnosis not present

## 2019-01-20 DIAGNOSIS — J9601 Acute respiratory failure with hypoxia: Secondary | ICD-10-CM | POA: Diagnosis not present

## 2019-01-20 DIAGNOSIS — R52 Pain, unspecified: Secondary | ICD-10-CM | POA: Diagnosis not present

## 2019-01-20 DIAGNOSIS — G4733 Obstructive sleep apnea (adult) (pediatric): Secondary | ICD-10-CM | POA: Diagnosis not present

## 2019-01-25 DIAGNOSIS — R52 Pain, unspecified: Secondary | ICD-10-CM | POA: Diagnosis not present

## 2019-01-25 DIAGNOSIS — R911 Solitary pulmonary nodule: Secondary | ICD-10-CM | POA: Diagnosis not present

## 2019-01-25 DIAGNOSIS — G459 Transient cerebral ischemic attack, unspecified: Secondary | ICD-10-CM | POA: Diagnosis not present

## 2019-01-25 DIAGNOSIS — J479 Bronchiectasis, uncomplicated: Secondary | ICD-10-CM | POA: Diagnosis not present

## 2019-01-25 DIAGNOSIS — I10 Essential (primary) hypertension: Secondary | ICD-10-CM | POA: Diagnosis not present

## 2019-01-25 DIAGNOSIS — I480 Paroxysmal atrial fibrillation: Secondary | ICD-10-CM | POA: Diagnosis not present

## 2019-01-25 DIAGNOSIS — I5033 Acute on chronic diastolic (congestive) heart failure: Secondary | ICD-10-CM | POA: Diagnosis not present

## 2019-01-25 DIAGNOSIS — M6281 Muscle weakness (generalized): Secondary | ICD-10-CM | POA: Diagnosis not present

## 2019-01-25 DIAGNOSIS — N183 Chronic kidney disease, stage 3 (moderate): Secondary | ICD-10-CM | POA: Diagnosis not present

## 2019-01-25 DIAGNOSIS — J9601 Acute respiratory failure with hypoxia: Secondary | ICD-10-CM | POA: Diagnosis not present

## 2019-01-25 DIAGNOSIS — G4733 Obstructive sleep apnea (adult) (pediatric): Secondary | ICD-10-CM | POA: Diagnosis not present

## 2019-01-26 ENCOUNTER — Telehealth: Payer: Self-pay

## 2019-01-26 NOTE — Telephone Encounter (Signed)
I tried to call patient about Covid-19 screening questions for upcoming appointment on 01/30/19 with Cecille Rubin. There was no answer and no voicemail.

## 2019-01-27 ENCOUNTER — Other Ambulatory Visit: Payer: Self-pay | Admitting: *Deleted

## 2019-01-27 ENCOUNTER — Telehealth: Payer: Self-pay | Admitting: Nurse Practitioner

## 2019-01-27 DIAGNOSIS — J9691 Respiratory failure, unspecified with hypoxia: Secondary | ICD-10-CM | POA: Diagnosis not present

## 2019-01-27 DIAGNOSIS — M6281 Muscle weakness (generalized): Secondary | ICD-10-CM | POA: Diagnosis not present

## 2019-01-27 NOTE — Telephone Encounter (Signed)
New Message     Called and left voicemail to confirm appt and answer COVID questions

## 2019-01-27 NOTE — Patient Outreach (Signed)
Member assessed for potential [facility] Care Management needs as a benefit of [place] [employer].  Collaboration with [place] this week to discuss potential [facility] Care Management needs for member. Mrs. Behrmann has been receiving rehab therapy at [facility]. Member's disposition plan is to return to [place] at [facility].  Telephone call made to Mrs. Ndiaye at [phone] to discuss potential [facility] Care Management needs. Patient identifiers confirmed.   Explained [facility] Management program services. Member pleasantly declines " I have all of that here at [facility] independent living."  Notification sent to [facility] RN to make aware [facility] Management services were declined.  Plan to sign off. Member denies having any [facility] Care Management needs at this time.    Marthenia Rolling, MSN-Ed, RN,BSN [facility] Acute Care Coordinator [phone] [facility]) [phone]  (Toll free office)

## 2019-01-27 NOTE — Progress Notes (Signed)
CARDIOLOGY OFFICE NOTE  Date:  01/30/2019    Natasha Ellis Date of Birth: August 25, 1930 Medical Record #[id number]  PCP:  Lavone Orn, MD  Cardiologist:  Radford Pax    Chief Complaint  Patient presents with  . Atrial Fibrillation    Work in visit - seen for Dr. Radford Pax    History of Present Illness: Natasha Ellis is a 85 y.o. female who presents today for a work in visit - seen for Dr. Radford Pax.    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  She was treated with multiple systemic therapies with the last being in 2016 for Waldenstrom's macroglobulinemia.   She was originally seen here by Dr. Radford Pax back in March for progressive dyspnea and lower extremity edema. She is noted to be quite sedentary due to her DOE.   She had been seen by pulmonary on 09/15/2018 and was felt to be volume overloaded with increased shortness of breath and wheezing.  Her Lasix was increased and she lost 5 pounds but no significant decrease in lower extremity edema.  She said her Lasix was decreased to 20 mg a day.  Chest CT in January showed no interstitial lung disease but did show mid to lower lung zone bronchiectasis and a lung nodule.  Echo was obtained which showed normal EF - mildly dilated aorta. Myoview also obtained and was negative for ischemia.   She was admitted earlier this month with suspected sepsis that was complicated by AF with RVR - she was placed on CCB and Eliquis. She is to have follow up CT for a lung nodule. Does not look like she was seen by Cardiology. Was noted to have apparent interval increase in a 1.7 cm LLL lung nodule. She is to have a follow up CT in 3 months.   The patient does not have symptoms  concerning for COVID-19 infection (fever, chills, cough, or new shortness of breath).   Comes in today. Here alone. She feels like she is doing well. She went back home from the facility late last week. She has had one spell of "flutters" - it has not recurred. She was previously on Plavix (for prior stroke per her report) - she is not taking and is now just on Eliquis. No bleeding. She is on chronic iron for her anemia. She wonders if this has has been her issue all along (the PAF). She has had some mild edema - just noticed this.   Past Medical History:  Diagnosis Date  . Arthritis   . Asthma   . Cancer ([place])    Lymphoma  . Cataract   . Chronic cough   . Diverticulosis of intestine    H/O  . DJD (degenerative joint disease) of lumbar spine   . IBS (irritable bowel syndrome)   . Left knee DJD   . 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 Right knee DJD   . Sleep apnea     Past Surgical History:  Procedure Laterality Date  . ABDOMINAL HYSTERECTOMY  1984   TAH  . EYE SURGERY Bilateral    with lens replacement  . EYE SURGERY Left    retnia  . KNEE ARTHROSCOPY Left   . NASAL SINUS  SURGERY N/A 12/04/2015   Procedure: ENDOSCOPIC SINUS SURGERY;  Surgeon: Izora Gala, MD;  Location: [facility];  Service: ENT;  Laterality: N/A;  . TEE WITHOUT CARDIOVERSION N/A 04/14/2016   Procedure: TRANSESOPHAGEAL ECHOCARDIOGRAM (TEE);  Surgeon: Lelon Perla, MD;  Location: [facility] ENDOSCOPY;  Service: Cardiovascular;  Laterality: N/A;  . TONSILLECTOMY AND ADENOIDECTOMY  childhood     Medications: Current Meds  Medication Sig  . acetaminophen (TYLENOL) 500 MG tablet Take 1,000 mg by mouth every 6 (six) hours as needed for pain.   Marland Kitchen albuterol (PROAIR HFA) 108 (90 Base) MCG/ACT inhaler Inhale 2 puffs into the lungs every 6 (six) hours as needed for wheezing or shortness of breath.  Marland Kitchen apixaban (ELIQUIS) 5 MG TABS tablet Take 1 tablet (5 mg total) by mouth 2 (two) times  daily.  . calcium gluconate 500 MG tablet Take 1 tablet by mouth daily.  Marland Kitchen diltiazem (CARDIZEM CD) 240 MG 24 hr capsule Take 1 capsule (240 mg total) by mouth daily for 30 days.  . ferrous sulfate 325 (65 FE) MG tablet Take 325 mg by mouth daily.   . furosemide (LASIX) 20 MG tablet Take 20 mg by mouth daily.  . potassium chloride (K-DUR,KLOR-CON) 10 MEQ tablet Take 10 mEq by mouth daily.   Marland Kitchen Respiratory Therapy Supplies (FLUTTER) DEVI Use as directed.  . sodium chloride (OCEAN) 0.65 % SOLN nasal spray Place 1 spray into both nostrils 3 (three) times daily as needed for congestion.  . [DISCONTINUED] albuterol (PROVENTIL) (2.5 MG/3ML) 0.083% nebulizer solution Take 3 mLs (2.5 mg total) by nebulization every 4 (four) hours as needed for wheezing or shortness of breath.  . [DISCONTINUED] apixaban (ELIQUIS) 5 MG TABS tablet Take 1 tablet (5 mg total) by mouth 2 (two) times daily.  . [DISCONTINUED] clopidogrel (PLAVIX) 75 MG tablet TAKE 1 TABLET BY MOUTH  DAILY (Patient taking differently: Take 75 mg by mouth daily. )  . [DISCONTINUED] diltiazem (CARDIZEM CD) 240 MG 24 hr capsule Take 1 capsule (240 mg total) by mouth daily for 30 days.  . [DISCONTINUED] fluticasone furoate-vilanterol (BREO ELLIPTA) 200-25 MCG/INH AEPB Inhale 1 puff into the lungs daily.     Allergies: Allergies  Allergen Reactions  . Codeine Other (See Comments)    Crazy-nightmares  . Amoxicillin Diarrhea    Has patient had a PCN reaction causing immediate rash, facial/tongue/throat swelling, SOB or lightheadedness with hypotension:  No Has patient had a PCN reaction causing severe rash involving mucus membranes or skin necrosis: No Has patient had a PCN reaction that required hospitalization No Has patient had a PCN reaction occurring within the last 10 years: Unknown--rx is diarrhea. If all of the above answers are "NO", then may proceed with Cephalosporin use.    . Cefdinir Cough  . Doxycycline Diarrhea  . Erythromycin  Other (See Comments)    GI upset  . Lisinopril Cough  . Oxybutynin Other (See Comments)    Dry mouth  . Sulfa Antibiotics Other (See Comments)    GI upset    Social History: The patient  reports that she quit smoking about 39 years ago. Her smoking use included cigarettes. She has a 25.00 pack-year smoking history. She has never used smokeless tobacco. She reports that she does not drink alcohol or use drugs.   Family History: The patient's family history includes Breast cancer in her sister; Heart failure (age of onset: 71) in her mother; Stroke in her father.   Review of Systems: Please see the history of  present illness.   All other systems are reviewed and negative.   Physical Exam: VS:  BP 124/68   Pulse 68   Ht 5\' 6"  (1.676 m)   Wt 278 lb (126.1 kg)   SpO2 96%   BMI 44.87 kg/m  .  BMI Body mass index is 44.87 kg/m.  Wt Readings from Last 3 Encounters:  01/30/19 278 lb (126.1 kg)  01/06/19 284 lb (128.8 kg)  10/12/18 287 lb 12.8 oz (130.5 kg)    General: Pleasant. Obese. She is alert and in no acute distress.  She is using a wheelchair.  HEENT: Normal.  Neck: Supple, no JVD, carotid bruits, or masses noted.  Cardiac: Regular rate and rhythm. No murmurs, rubs, or gallops. No edema.  Respiratory:  Lungs are clear to auscultation bilaterally with normal work of breathing.  GI: Soft and nontender.  MS: No deformity or atrophy. Gait and ROM intact.  Skin: Warm and dry. Color is normal.  Neuro:  Strength and sensation are intact and no gross focal deficits noted.  Psych: Alert, appropriate and with normal affect.   LABORATORY DATA:  EKG:  EKG is ordered today. This demonstrates NSR today.  Lab Results  Component Value Date   WBC 7.6 01/12/2019   HGB 10.2 (L) 01/12/2019   HCT 29.6 (L) 01/12/2019   PLT 440 (H) 01/12/2019   GLUCOSE 88 01/12/2019   CHOL 123 05/05/2017   TRIG 81 01/06/2019   HDL 38.80 (L) 05/05/2017   LDLCALC 68 05/05/2017   ALT 15 01/06/2019    AST 25 01/06/2019   NA 141 01/12/2019   K 3.8 01/12/2019   CL 107 01/12/2019   CREATININE 1.33 (H) 01/12/2019   BUN 34 (H) 01/12/2019   CO2 23 01/12/2019   TSH 1.201 01/07/2019   INR 1.5 (H) 01/07/2019   HGBA1C 5.6 04/29/2013       BNP (last 3 results) Recent Labs    01/06/19 1822  BNP 190.9*    ProBNP (last 3 results) Recent Labs    10/12/18 1144 12/30/18 1117  PROBNP 455 673     Other Studies Reviewed Today:  ECHO IMPRESSIONS 12/2018   1. The left ventricle has normal systolic function with an ejection fraction of 60-65%. The cavity size was normal. There is moderately increased left ventricular wall thickness. Left ventricular diastolic Doppler parameters are consistent with impaired  relaxation.  2. The right ventricle has normal systolic function. The cavity was normal. There is no increase in right ventricular wall thickness.  3. Left atrial size was mildly dilated.  4. Right atrial size was mildly dilated.  5. There is mild dilatation of the ascending aorta.  6. The interatrial septum was not assessed.  Notes recorded by Sueanne Margarita, MD on 12/21/2018 at 9:23 PM EDT  Please let patient know that echo showed normal LVF with moderately thickened heart muscle and increased stiffness of heart muscle, mild right and left atrium and mildly enlarged ascending aorta at 4cm. Repeat limited echo in 1 year for ascending aortic aneurysm   Myoview Study Highlights 12/2018    The left ventricular ejection fraction is hyperdynamic (>65%).  Nuclear stress EF: 66%.  There was no ST segment deviation noted during stress.  The study is normal.  This is a low risk study.   Low risk stress nuclear study with normal perfusion and normal left ventricular regional and global systolic function.    Assessment/Plan:  1. New onset PAF with RVR - in the  setting of presumed sepsis - CHADSVASC is at least 5 - she is on anticoagulation - recheck lab today. She is not  taking Plavix.   2. Chronic shortness of breath/DOE - with chronic lung problems including bronchiectasis and chronic colonization with Pseudomonas - also with possible increase in lung nodule - for repeat CT in 3 months - would defer to PCP  3. HTN - BP is fine - she says this is typically not a problem.   4. Mildly dilated aorta - would favor good BP control.   5. Chronic lower extremity edema - will need to follow -  Encouraged her to keep her legs elevated, use compression stockings and restrict salt - hopefully we can keep her on CCB therapy at this dose.   6. COVID-19 Education: The signs and symptoms of COVID-19 were discussed with the patient and how to seek care for testing (follow up with PCP or arrange E-visit).  The importance of social distancing, staying at home, hand hygiene and wearing a mask when out in public were discussed today.  Current medicines are reviewed with the patient today.  The patient does not have concerns regarding medicines other than what has been noted above.  The following changes have been made:  See above.  Labs/ tests ordered today include:    Orders Placed This Encounter  Procedures  . Basic metabolic panel  . CBC  . EKG 12-Lead     Disposition:   FU with [place] in about 3 months. Lab today.    Patient is agreeable to this plan and will call if any problems develop in the interim.   SignedTruitt Merle, NP  01/30/2019 3:26 PM  [facility] [address] [place], [place]  [address] Phone: [phone] Fax: [phone]

## 2019-01-30 ENCOUNTER — Other Ambulatory Visit: Payer: Self-pay

## 2019-01-30 ENCOUNTER — Encounter: Payer: Self-pay | Admitting: Nurse Practitioner

## 2019-01-30 ENCOUNTER — Ambulatory Visit (INDEPENDENT_AMBULATORY_CARE_PROVIDER_SITE_OTHER): Payer: Medicare Other | Admitting: Nurse Practitioner

## 2019-01-30 VITALS — BP 124/68 | HR 68 | Ht 66.0 in | Wt 278.0 lb

## 2019-01-30 DIAGNOSIS — I480 Paroxysmal atrial fibrillation: Secondary | ICD-10-CM

## 2019-01-30 DIAGNOSIS — Z79899 Other long term (current) drug therapy: Secondary | ICD-10-CM | POA: Diagnosis not present

## 2019-01-30 MED ORDER — DILTIAZEM HCL ER COATED BEADS 240 MG PO CP24: 240.0000 mg | ORAL_CAPSULE | Freq: Every day | ORAL | 3 refills | Status: DC

## 2019-01-30 MED ORDER — APIXABAN 5 MG PO TABS: 5.0000 mg | ORAL_TABLET | Freq: Two times a day (BID) | ORAL | 3 refills | Status: DC

## 2019-01-30 NOTE — Telephone Encounter (Signed)

## 2019-01-30 NOTE — Telephone Encounter (Signed)
Patient returned your call.

## 2019-01-30 NOTE — Patient Instructions (Addendum)
After Visit Summary:  We will be checking the following labs today - BMET & CBC   Medication Instructions:    Continue with your current medicines.   I sent in your refills to [facility] today   If you need a refill on your cardiac medications before your next appointment, please call your pharmacy.     Testing/Procedures To Be Arranged:  N/A  Follow-Up:   See [place] in 3 months    At [facility], you and your health needs are our priority.  As part of our continuing mission to provide you with exceptional heart care, we have created designated Provider Care Teams.  These Care Teams include your primary Cardiologist (physician) and Advanced Practice Providers (APPs -  Physician Assistants and Nurse Practitioners) who all work together to provide you with the care you need, when you need it.  Special Instructions:  . Stay safe, stay home, wash your hands for at least 20 seconds and wear a mask when out in public.  . It was good to talk with you today.    Call the [place] office at [phone] if you have any questions, problems or concerns.

## 2019-01-31 DIAGNOSIS — M6281 Muscle weakness (generalized): Secondary | ICD-10-CM | POA: Diagnosis not present

## 2019-01-31 DIAGNOSIS — J9691 Respiratory failure, unspecified with hypoxia: Secondary | ICD-10-CM | POA: Diagnosis not present

## 2019-01-31 LAB — BASIC METABOLIC PANEL
BUN/Creatinine Ratio: 15 (ref 12–28)
BUN: 19 mg/dL (ref 8–27)
CO2: 26 mmol/L (ref 20–29)
Calcium: 10.3 mg/dL (ref 8.7–10.3)
Chloride: 105 mmol/L (ref 96–106)
Creatinine, Ser: 1.24 mg/dL — ABNORMAL HIGH (ref 0.57–1.00)
GFR calc Af Amer: 46 mL/min/{1.73_m2} — ABNORMAL LOW (ref >59)
GFR calc non Af Amer: 40 mL/min/{1.73_m2} — ABNORMAL LOW (ref >59)
Glucose: 99 mg/dL (ref 65–99)
Potassium: 4.7 mmol/L (ref 3.5–5.2)
Sodium: 145 mmol/L — ABNORMAL HIGH (ref 134–144)

## 2019-01-31 LAB — CBC
Hematocrit: 49.1 % — ABNORMAL HIGH (ref 34.0–46.6)
Hemoglobin: 15.8 g/dL (ref 11.1–15.9)
MCH: 26.1 pg — ABNORMAL LOW (ref 26.6–33.0)
MCHC: 32.2 g/dL (ref 31.5–35.7)
MCV: 81 fL (ref 79–97)
Platelets: 276 10*3/uL (ref 150–450)
RBC: 6.06 x10E6/uL — ABNORMAL HIGH (ref 3.77–5.28)
RDW: 16.9 % — ABNORMAL HIGH (ref 11.7–15.4)
WBC: 4.3 10*3/uL (ref 3.4–10.8)

## 2019-02-02 ENCOUNTER — Telehealth: Payer: Self-pay | Admitting: Adult Health

## 2019-02-02 DIAGNOSIS — J9691 Respiratory failure, unspecified with hypoxia: Secondary | ICD-10-CM | POA: Diagnosis not present

## 2019-02-02 DIAGNOSIS — M6281 Muscle weakness (generalized): Secondary | ICD-10-CM | POA: Diagnosis not present

## 2019-02-02 NOTE — Telephone Encounter (Signed)
It is OK to stop the [facility] if she wasn't noticing any difference. Thanks.

## 2019-02-02 NOTE — Telephone Encounter (Signed)
Spoke with patient. She stated that she was in the hospital for a week back in June. Afterwards, she was discharged to rehab for a few weeks and now she is home. In the time she was in the hospital to getting home, her medications have changed. Prior to the hospital stay, she was only on the albuterol inhaler and albuterol nebs as needed. Afterwards at the rehab center, they told her to discontinue the albuterol neb due to covid and prescribed Breo 200.   She does not like the Breo 200. Since she has been at home, she has stopped using it. She has gone back to using the albuterol neb solution and inhaler as needed. She has not had any problems with breathing since being home.   She wants to know if it is safe for her to discontinue Breo 200.    Tonya, please advise since neither RA or TP are in office today. Thanks!

## 2019-02-02 NOTE — Telephone Encounter (Signed)
Spoke with patient. She stated that she will remain off of the [facility] She will go back to using the albuterol. Advised her to call us back if she noticed any changes in her breathing. She verbalized understanding.

## 2019-02-05 DIAGNOSIS — J9691 Respiratory failure, unspecified with hypoxia: Secondary | ICD-10-CM | POA: Diagnosis not present

## 2019-02-05 DIAGNOSIS — M6281 Muscle weakness (generalized): Secondary | ICD-10-CM | POA: Diagnosis not present

## 2019-02-06 DIAGNOSIS — J9691 Respiratory failure, unspecified with hypoxia: Secondary | ICD-10-CM | POA: Diagnosis not present

## 2019-02-06 DIAGNOSIS — M6281 Muscle weakness (generalized): Secondary | ICD-10-CM | POA: Diagnosis not present

## 2019-02-07 DIAGNOSIS — J9691 Respiratory failure, unspecified with hypoxia: Secondary | ICD-10-CM | POA: Diagnosis not present

## 2019-02-07 DIAGNOSIS — M6281 Muscle weakness (generalized): Secondary | ICD-10-CM | POA: Diagnosis not present

## 2019-02-08 DIAGNOSIS — M6281 Muscle weakness (generalized): Secondary | ICD-10-CM | POA: Diagnosis not present

## 2019-02-08 DIAGNOSIS — J9691 Respiratory failure, unspecified with hypoxia: Secondary | ICD-10-CM | POA: Diagnosis not present

## 2019-02-09 DIAGNOSIS — J9691 Respiratory failure, unspecified with hypoxia: Secondary | ICD-10-CM | POA: Diagnosis not present

## 2019-02-09 DIAGNOSIS — M6281 Muscle weakness (generalized): Secondary | ICD-10-CM | POA: Diagnosis not present

## 2019-02-12 DIAGNOSIS — M6281 Muscle weakness (generalized): Secondary | ICD-10-CM | POA: Diagnosis not present

## 2019-02-12 DIAGNOSIS — J9691 Respiratory failure, unspecified with hypoxia: Secondary | ICD-10-CM | POA: Diagnosis not present

## 2019-02-13 DIAGNOSIS — M6281 Muscle weakness (generalized): Secondary | ICD-10-CM | POA: Diagnosis not present

## 2019-02-13 DIAGNOSIS — J9691 Respiratory failure, unspecified with hypoxia: Secondary | ICD-10-CM | POA: Diagnosis not present

## 2019-02-14 DIAGNOSIS — M6281 Muscle weakness (generalized): Secondary | ICD-10-CM | POA: Diagnosis not present

## 2019-02-14 DIAGNOSIS — J9691 Respiratory failure, unspecified with hypoxia: Secondary | ICD-10-CM | POA: Diagnosis not present

## 2019-02-22 DIAGNOSIS — M8580 Other specified disorders of bone density and structure, unspecified site: Secondary | ICD-10-CM | POA: Diagnosis not present

## 2019-02-22 DIAGNOSIS — I480 Paroxysmal atrial fibrillation: Secondary | ICD-10-CM | POA: Diagnosis not present

## 2019-02-22 DIAGNOSIS — I5189 Other ill-defined heart diseases: Secondary | ICD-10-CM | POA: Diagnosis not present

## 2019-02-22 DIAGNOSIS — J479 Bronchiectasis, uncomplicated: Secondary | ICD-10-CM | POA: Diagnosis not present

## 2019-02-22 DIAGNOSIS — R0600 Dyspnea, unspecified: Secondary | ICD-10-CM | POA: Diagnosis not present

## 2019-03-09 ENCOUNTER — Telehealth: Payer: Self-pay | Admitting: Cardiology

## 2019-03-09 NOTE — Telephone Encounter (Signed)
Pt is requesting appointment regarding her recurring shortness of breath. She reports SOB with exertion for the past week. Pt has had recent echo and stress test, recently in ED with onset of AFIB. Pt denies chest pain, dizziness or swelling. Pt is requesting to be seen in office as soon as possible. Appt made With Tera Helper for next week. Advised pt to call back if any new or worsening symptoms present.

## 2019-03-09 NOTE — Telephone Encounter (Signed)
New message   Pt c/o Shortness Of Breath: STAT if SOB developed within the last 24 hours or pt is noticeably SOB on the phone  1. Are you currently SOB (can you hear that pt is SOB on the phone)? Yes   2. How long have you been experiencing SOB?patient has had sob since June   3. Are you SOB when sitting or when up moving around? Moving around   4. Are you currently experiencing any other symptoms? No

## 2019-03-10 ENCOUNTER — Ambulatory Visit: Payer: Medicare Other | Admitting: Neurology
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
## 2019-03-14 ENCOUNTER — Other Ambulatory Visit: Payer: Self-pay

## 2019-03-14 ENCOUNTER — Encounter: Payer: Self-pay | Admitting: Nurse Practitioner

## 2019-03-14 ENCOUNTER — Telehealth: Payer: Self-pay | Admitting: Pulmonary Disease

## 2019-03-14 ENCOUNTER — Ambulatory Visit (INDEPENDENT_AMBULATORY_CARE_PROVIDER_SITE_OTHER): Payer: Medicare Other | Admitting: Nurse Practitioner

## 2019-03-14 ENCOUNTER — Ambulatory Visit
Admission: RE | Admit: 2019-03-14 | Discharge: 2019-03-14 | Disposition: A | Discharge status: Home / Self Care | Payer: Medicare Other | Source: Ambulatory Visit | Attending: Nurse Practitioner | Admitting: Nurse Practitioner

## 2019-03-14 VITALS — BP 120/58 | HR 73 | Ht 66.0 in | Wt 277.1 lb

## 2019-03-14 DIAGNOSIS — I480 Paroxysmal atrial fibrillation: Secondary | ICD-10-CM

## 2019-03-14 DIAGNOSIS — R0602 Shortness of breath: Secondary | ICD-10-CM

## 2019-03-14 DIAGNOSIS — R600 Localized edema: Secondary | ICD-10-CM | POA: Diagnosis not present

## 2019-03-14 DIAGNOSIS — Z79899 Other long term (current) drug therapy: Secondary | ICD-10-CM | POA: Diagnosis not present

## 2019-03-14 DIAGNOSIS — I10 Essential (primary) hypertension: Secondary | ICD-10-CM

## 2019-03-14 DIAGNOSIS — J479 Bronchiectasis, uncomplicated: Secondary | ICD-10-CM | POA: Diagnosis not present

## 2019-03-14 MED ORDER — DILTIAZEM HCL ER COATED BEADS 180 MG PO CP24: 180.0000 mg | ORAL_CAPSULE | Freq: Every day | ORAL | 3 refills | Status: DC

## 2019-03-14 NOTE — Patient Instructions (Addendum)
After Visit Summary:  We will be checking the following labs today - BMET, CBC and BNP  Please go to [facility] to [place] on the first floor for a chest Xray - you may walk in.    Medication Instructions:    Continue with your current medicines. BUT  I am cutting the dose of Diltiazem back to 180 mg a day - this will be sent to [facility]   If you need a refill on your cardiac medications before your next appointment, please call your pharmacy.     Testing/Procedures To Be Arranged:  N/A  Follow-Up:   See me as planned next month  We are going to arrange for OV with pulmonary - we will be in touch with you about this    At [facility], you and your health needs are our priority.  As part of our continuing mission to provide you with exceptional heart care, we have created designated Provider Care Teams.  These Care Teams include your primary Cardiologist (physician) and Advanced Practice Providers (APPs -  Physician Assistants and Nurse Practitioners) who all work together to provide you with the care you need, when you need it.  Special Instructions:  . Stay safe, stay home, wash your hands for at least 20 seconds and wear a mask when out in public.  . It was good to talk with you today.    Call the [place] office at [phone] if you have any questions, problems or concerns.

## 2019-03-14 NOTE — Telephone Encounter (Signed)
ATC pt, line rang busy. Will attempt to call pt back.

## 2019-03-14 NOTE — Telephone Encounter (Signed)
Called and spoke with patient to set up an appointment. Pt last seen 09/15/2018 by TP. I informed pt that Dr. Elsworth Soho, her primary pulmonologist, does not have a schedule established any time soon. She states she needs an appointment with an MD as soon as possible. I offered her a visit with a NP, which she denied. Pt would like to establish care with Dr. Vaughan Browner. I let her know I would route this message to RA first to make sure it is ok with him for patient to switch providers. Pt expressed understanding.   RA, please advise if you would be ok with switching this patient to see Dr. Vaughan Browner. Thank you.

## 2019-03-14 NOTE — Telephone Encounter (Signed)
Okay with me I will be opening office for the week of 8/25 but okay to switch providers if she cannot wait until then

## 2019-03-15 ENCOUNTER — Other Ambulatory Visit: Payer: Self-pay | Admitting: *Deleted

## 2019-03-15 LAB — BASIC METABOLIC PANEL
BUN/Creatinine Ratio: 13 (ref 12–28)
BUN: 18 mg/dL (ref 8–27)
CO2: 25 mmol/L (ref 20–29)
Calcium: 10.4 mg/dL — ABNORMAL HIGH (ref 8.7–10.3)
Chloride: 101 mmol/L (ref 96–106)
Creatinine, Ser: 1.43 mg/dL — ABNORMAL HIGH (ref 0.57–1.00)
GFR calc Af Amer: 39 mL/min/{1.73_m2} — ABNORMAL LOW (ref >59)
GFR calc non Af Amer: 33 mL/min/{1.73_m2} — ABNORMAL LOW (ref >59)
Glucose: 101 mg/dL — ABNORMAL HIGH (ref 65–99)
Potassium: 4.7 mmol/L (ref 3.5–5.2)
Sodium: 144 mmol/L (ref 134–144)

## 2019-03-15 LAB — CBC
Hematocrit: 37.8 % (ref 34.0–46.6)
Hemoglobin: 11.5 g/dL (ref 11.1–15.9)
MCH: 26.1 pg — ABNORMAL LOW (ref 26.6–33.0)
MCHC: 30.4 g/dL — ABNORMAL LOW (ref 31.5–35.7)
MCV: 86 fL (ref 79–97)
Platelets: 422 10*3/uL (ref 150–450)
RBC: 4.4 x10E6/uL (ref 3.77–5.28)
RDW: 16.3 % — ABNORMAL HIGH (ref 11.7–15.4)
WBC: 5.3 10*3/uL (ref 3.4–10.8)

## 2019-03-15 LAB — PRO B NATRIURETIC PEPTIDE: NT-Pro BNP: 445 pg/mL (ref 0–738)

## 2019-03-15 MED ORDER — DOXYCYCLINE MONOHYDRATE 100 MG PO TABS: 100.0000 mg | ORAL_TABLET | Freq: Two times a day (BID) | ORAL | 0 refills | Status: DC

## 2019-03-15 NOTE — Telephone Encounter (Signed)
Called and spoke with pt asking her if she wanted to switch providers or if she wanted to stay with RA. Pt said that she really likes RA and would like to stay with him if at all possible but due to her not able to get in for an appt with him, she said if she had to switch providers to be seen, she would.  I stated to her that we heard from RA that he is going to be opening office the week of 8/25. Pt said that she will wait until then to be scheduled an appt with RA. Stated to her that we would call her as soon as his schedule opened up to get her in for an appt and pt verbalized understanding.  Routing to [place] so she knows that pt needs to be scheduled with Dr. Elsworth Soho ASAP.

## 2019-03-16 NOTE — Telephone Encounter (Signed)
Called and spoke to patient - scheduled patient with Dr. Elsworth Soho on 03/17/2019-pr

## 2019-03-17 ENCOUNTER — Other Ambulatory Visit: Payer: Self-pay

## 2019-03-17 ENCOUNTER — Ambulatory Visit (INDEPENDENT_AMBULATORY_CARE_PROVIDER_SITE_OTHER): Payer: Medicare Other | Admitting: Pulmonary Disease

## 2019-03-17 ENCOUNTER — Encounter: Payer: Self-pay | Admitting: Pulmonary Disease

## 2019-03-17 DIAGNOSIS — R0609 Other forms of dyspnea: Secondary | ICD-10-CM | POA: Diagnosis not present

## 2019-03-17 DIAGNOSIS — R911 Solitary pulmonary nodule: Secondary | ICD-10-CM

## 2019-03-17 DIAGNOSIS — R0602 Shortness of breath: Secondary | ICD-10-CM | POA: Diagnosis not present

## 2019-03-17 DIAGNOSIS — J479 Bronchiectasis, uncomplicated: Secondary | ICD-10-CM

## 2019-03-17 DIAGNOSIS — R0600 Dyspnea, unspecified: Secondary | ICD-10-CM

## 2019-03-17 NOTE — Assessment & Plan Note (Signed)
Cause of shortness of breath is unclear- Prior PFTs have not shown airway obstruction She does not seem to be in obvious fluid overload  Stay on Lasix 40 mg daily for at least 2 more weeks Increase albuterol nebs to twice daily-monitor your heart rate and decrease to once daily if heart rate above 100 Try to increase activity level -certainly there is an element of deconditioning here

## 2019-03-17 NOTE — Assessment & Plan Note (Addendum)
Nodule in left lower lung has increased in size gradually from 12 mm in January to 17 mm now. Proceed with PET scan We will consider radiation therapy if this  lights up on PET scan  We discussed that she is not a good candidate for bronchoscopic or CT-guided biopsy given high risk of pneumothorax

## 2019-03-17 NOTE — Assessment & Plan Note (Signed)
She does not seem to be having a flare so I have asked her to stop the antibiotic She is not tolerating doxycycline. If she develops cough or sputum production or fever we will give her Levaquin

## 2019-03-17 NOTE — Progress Notes (Signed)
Subjective:    Patient ID: Natasha Ellis, female    DOB: 1931/07/01, 85 y.o.   MRN: [id number]  HPI  85 yo with hx of recurrent pneumonia, chronic cough &OSA -onCPAP 8 cm CHronic coughsince 2005  - Low grade B cell diagnosed 2004. S/p chemo 2011 at [place]. Transformed into lymphoplasmacytic lymphoma with a high serum viscosity and [name] Kappa serum M spike. Treated with multiple systemic therapies, Last 12/2014   She has a 25 pack-year smoking history -Quit in 1981 Was using only nocutrnal oxygen--this was discontinued in sept 2013   Underwent sinus surgery 12/2015 [name])   Chief Complaint  Patient presents with  . Follow-up    Increased SOB. CXR done tuesday. Currenlty being treated for PNE with Doxyclycline. She reports vomitng and diarrhea afte taking doxycycline , would like to change it.      Hospitalized in June then went to rehab and home, was transiently placed on Breo.  She did not feel much any improvement with this and has discontinued. She was treated for bronchiectatic exacerbation and had new onset A. fib with RVR -placed on Cardizem and Eliquis  CT angiogram 01/2019 showed interval increase in left lower lobe nodule up to 1.7 cm compared to CT from 08/2018 where this was 12 mm.  On CT scan 01/2016, this was 7 mm  She has chronic lower extremity edema, I increased her Lasix for a month back on her last visit in 08/2018  He was seen on a follow-up visit with cardiology 03/14/2019 for increasing shortness of breath or edema, shows normal LVEF and stress Myoview was low risk  Chest x-ray 03/14/2019 was personally reviewed which shows right infrahilar infiltrate?  Acute versus chronic Labs show slight increase in creatinine from 1.2-1.4, no leukocytosis, hemoglobin 11.5 She denies any symptoms for COVID, tested -01/07/2019  She was given doxycycline which has caused vomiting and diarrhea   Significant tests/ events reviewed  08/2015 CT sinus >>Extensive  multifocal paranasal sinus disease, most marked in the ethmoid air cell complexes and in the left maxillary antrum    10/2015 ONO shows minimal desaturations; does not need O2.  01/2016 pansensitive pseudomonas in sputum  PFTs 12/2012 No airway obstruction ratio 74, FEV1 84%, DLCO 56% spirometry 5/2018does not show any airway obstruction, mild restriction   Past Medical History:  Diagnosis Date  . Arthritis   . Asthma   . Cancer ([place])    Lymphoma  . Cataract   . Chronic cough   . Diverticulosis of intestine    H/O  . DJD (degenerative joint disease) of lumbar spine   . IBS (irritable bowel syndrome)   . Left knee DJD   . 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 Right knee DJD   . Sleep apnea      Review of Systems   neg for any significant sore throat, dysphagia, itching, sneezing, nasal congestion or excess/ purulent secretions, fever, chills, sweats, unintended wt loss, pleuritic or exertional cp, hempoptysis, orthopnea pnd or change in chronic leg swelling. Also denies presyncope, palpitations, heartburn, abdominal pain, nausea, vomiting, diarrhea or change in bowel or urinary habits, dysuria,hematuria, rash, arthralgias, visual complaints, headache, numbness weakness or ataxia.      Objective:   Physical Exam  Gen. Pleasant, obese, in no distress, normal affect ENT - no pallor,icterus, no post nasal drip, class 2-3 airway Neck: No JVD, no thyromegaly, no carotid bruits Lungs: no use of accessory muscles, no dullness to percussion,  decreased without rales or rhonchi  Cardiovascular: Rhythm regular, heart sounds  normal, no murmurs or gallops, no peripheral edema Abdomen: soft and non-tender, no hepatosplenomegaly, BS normal. Musculoskeletal: No deformities, no cyanosis or clubbing Neuro:  alert, non focal, no tremors       Assessment & Plan:

## 2019-03-17 NOTE — Patient Instructions (Signed)
Cause of shortness of breath is unclear- Stay on Lasix 40 mg daily for at least 2 more weeks Increase albuterol nebs to twice daily-monitor your heart rate and decrease to once daily if heart rate above 100 Try to increase activity level   Nodule in left lower lung has increased in size gradually from 12 mm in January to 17 mm now. Proceed with PET scan We will consider radiation therapy if this  lights up on PET scan

## 2019-03-27 ENCOUNTER — Other Ambulatory Visit: Payer: Self-pay

## 2019-03-27 ENCOUNTER — Encounter (HOSPITAL_COMMUNITY): Admission: RE | Admit: 2019-03-27 | Payer: Medicare Other | Source: Ambulatory Visit

## 2019-03-27 ENCOUNTER — Ambulatory Visit (HOSPITAL_COMMUNITY)
Admission: RE | Admit: 2019-03-27 | Discharge: 2019-03-27 | Disposition: A | Discharge status: Home / Self Care | Payer: Medicare Other | Source: Ambulatory Visit | Attending: Pulmonary Disease | Admitting: Pulmonary Disease

## 2019-03-27 DIAGNOSIS — R0602 Shortness of breath: Secondary | ICD-10-CM | POA: Diagnosis not present

## 2019-03-27 DIAGNOSIS — R591 Generalized enlarged lymph nodes: Secondary | ICD-10-CM | POA: Insufficient documentation

## 2019-03-27 DIAGNOSIS — Z79899 Other long term (current) drug therapy: Secondary | ICD-10-CM | POA: Diagnosis not present

## 2019-03-27 DIAGNOSIS — C8590 Non-Hodgkin lymphoma, unspecified, unspecified site: Secondary | ICD-10-CM | POA: Diagnosis not present

## 2019-03-27 LAB — GLUCOSE, CAPILLARY: Glucose-Capillary: 103 mg/dL — ABNORMAL HIGH (ref 70–99)

## 2019-03-27 MED ORDER — FLUDEOXYGLUCOSE F - 18 (FDG) INJECTION
13.8900 | Freq: Once | INTRAVENOUS | Status: AC | PRN
  Administered 2019-03-27: 13.89 via INTRAVENOUS

## 2019-03-28 ENCOUNTER — Telehealth: Payer: Self-pay | Admitting: Oncology

## 2019-03-28 ENCOUNTER — Encounter: Payer: Self-pay | Admitting: *Deleted

## 2019-03-28 NOTE — Telephone Encounter (Signed)
Scheduled appt per 8/25 sch message - spoke with patient and they are aware of appt date and time

## 2019-03-28 NOTE — Progress Notes (Unsigned)
PET scan shows several nodes lighting up. Needs f/u here in 2-3 weeks with BS or Lattie Haw, per Dr. Benay Spice. Scheduling message sent.

## 2019-03-29 ENCOUNTER — Ambulatory Visit: Payer: Medicare Other | Admitting: Pulmonary Disease

## 2019-03-30 ENCOUNTER — Other Ambulatory Visit: Payer: Self-pay | Admitting: Nurse Practitioner

## 2019-03-30 DIAGNOSIS — C880 Waldenstrom macroglobulinemia: Secondary | ICD-10-CM

## 2019-03-30 DIAGNOSIS — Z1389 Encounter for screening for other disorder: Secondary | ICD-10-CM | POA: Diagnosis not present

## 2019-03-30 DIAGNOSIS — Z0000 Encounter for general adult medical examination without abnormal findings: Secondary | ICD-10-CM | POA: Diagnosis not present

## 2019-04-07 ENCOUNTER — Ambulatory Visit: Payer: Medicare Other | Admitting: Neurology

## 2019-04-17 ENCOUNTER — Encounter: Payer: Self-pay | Admitting: Adult Health

## 2019-04-17 ENCOUNTER — Ambulatory Visit (INDEPENDENT_AMBULATORY_CARE_PROVIDER_SITE_OTHER): Payer: Medicare Other | Admitting: Adult Health

## 2019-04-17 ENCOUNTER — Other Ambulatory Visit: Payer: Self-pay

## 2019-04-17 ENCOUNTER — Telehealth: Payer: Self-pay | Admitting: *Deleted

## 2019-04-17 DIAGNOSIS — G4733 Obstructive sleep apnea (adult) (pediatric): Secondary | ICD-10-CM | POA: Diagnosis not present

## 2019-04-17 DIAGNOSIS — R911 Solitary pulmonary nodule: Secondary | ICD-10-CM

## 2019-04-17 DIAGNOSIS — J479 Bronchiectasis, uncomplicated: Secondary | ICD-10-CM | POA: Diagnosis not present

## 2019-04-17 DIAGNOSIS — Z23 Encounter for immunization: Secondary | ICD-10-CM

## 2019-04-17 NOTE — Progress Notes (Signed)
@Patient  ID: Natasha Ellis, female    DOB: 1931/03/31, 85 y.o.   MRN: [id number]  Chief Complaint  Patient presents with   Follow-up    lung nodules     Referring provider: Lavone Orn, MD  HPI: 85 year old female former smoker followed for recurrent pneumonia, chronic cough, obstructive sleep apnea on CPAP.She is colonized with pansensitive Pseudomonas. 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 Treated with multiple systemic therapies last in 2016 Waldenstrom's macroglobulinemia Previously on nocturnal oxygen discontinued in September 2013 Chronic sinus disease status post sinus surgery 2017 [name])  TEST/EVENTS :  08/2015 CT sinus >>Extensive multifocal paranasal sinus disease, most marked in the ethmoid air cell complexes and in the left maxillary antrum  05/2015 CT chest showed bilateral lower lobe consolidation  10/2015 ONO shows minimal desaturations; does not need O2.  01/2016 pansensitive pseudomonas in sputum   PFTs 12/2012 No airway obstruction ratio 74, FEV1 84%, DLCO 56%  spirometry 5/2018does not show any airway obstruction, mild restriction  CT chest August 31, 2018 showed no ILD.  Mid to lower lung zone bronchiectasis.  And a 12 mm left lower lobe lung nodule.   09/2018 VQ scan that showed very low probability of pulmonary embolism.  12/2018 Echo w/ normal LVEF and stress Myoview was low risk    04/17/2019 Follow up : Lung nodule, Bronchiectasis , OSA on CPAP  Patient returns for a one-month follow-up.  Patient has a known lung nodule.  Has shown recent growth on serial CT chest.   CT angiogram 01/2019 showed interval increase in left lower lobe nodule up to 1.7 cm compared to CT from 08/2018 where this was 12 mm.  On CT scan 01/2016, this was 7 mm.  Subsequent PET scan showed bilateral  cervical lymphadenopathy, supraclavicular lymphadenopathy,Multifocal thoracic lymphadenopathy including mediastinal, hilar and bilateral axillary nodesNo suspicious pulmonary nodules .  Multifocal abdominal pelvic lymphadenopathy.Heterogeneous osseous hypermetabolism with focal hypermetabolism in the left iliac bone. We discussed her test results.  She has a follow-up with oncology later this week  Patient recently had a slow to resolve bronchiectatic exacerbation.  She says she has slowly been improving.  Cough and congestion are decreased.  She denies any fever or hemoptysis. Shortness of breath is much better. Uses flutter valve on occasion . Using albuterol neb Twice daily  .   Patient remains on CPAP for sleep apnea.  Says she wears it every night.  Download shows 100% compliance with daily average usage at 10.5 hours.  She is on auto CPAP 8 to 15 cm H2O.  AHI 2.7.  She is followed by cardiology for A. Fib and diastolic dysfunction . She is on Eliquis, Cardizem and Lasix.  Denies increased leg swelling.   Lives alone , [facility]. Daughter helps. She drives . Uses rollator at home.    Allergies  Allergen Reactions   Codeine Other (See Comments)    Crazy-nightmares   Amoxicillin Diarrhea    Has patient had a PCN reaction causing immediate rash, facial/tongue/throat swelling, SOB or lightheadedness with hypotension:  No Has patient had a PCN reaction causing severe rash involving mucus membranes or skin necrosis: No Has patient had a PCN reaction that required hospitalization No Has patient had a PCN reaction occurring within the last 10 years: Unknown--rx is diarrhea. If all of the above answers are "NO", then may proceed with Cephalosporin use.  Cefdinir Cough   Doxycycline Diarrhea   Erythromycin Other (See Comments)    GI upset   Lisinopril Cough   Oxybutynin Other (See Comments)    Dry mouth   Sulfa Antibiotics Other (See Comments)    GI upset    Immunization  History  Administered Date(s) Administered   Influenza Whole 05/09/2012   Influenza, High Dose Seasonal PF 05/24/2017   Influenza,inj,Quad PF,6+ Mos 04/30/2013, 10/24/2015, 04/22/2016, 05/09/2018   Influenza-Unspecified 05/03/2014   Pneumococcal Polysaccharide-23 08/03/2009    Past Medical History:  Diagnosis Date   Arthritis    Asthma    Cancer ([place])    Lymphoma   Cataract    Chronic cough    Diverticulosis of intestine    H/O   DJD (degenerative joint disease) of lumbar spine    IBS (irritable bowel syndrome)    Left knee DJD    Malignant lymphoplasmacytic lymphoma ([place]) 2004   Non-Hodgkin Lymphoma   Right knee DJD    Sleep apnea     Tobacco History: Social History   Tobacco Use  Smoking Status Former Smoker   Packs/day: 1.00   Years: 25.00   Pack years: 25.00   Types: Cigarettes   Quit date: 08/04/1979   Years since quitting: 39.7  Smokeless Tobacco Never Used   Counseling given: Not Answered   Outpatient Medications Prior to Visit  Medication Sig Dispense Refill   acetaminophen (TYLENOL) 500 MG tablet Take 1,000 mg by mouth every 6 (six) hours as needed for pain.      albuterol (PROAIR HFA) 108 (90 Base) MCG/ACT inhaler Inhale 2 puffs into the lungs every 6 (six) hours as needed for wheezing or shortness of breath. 1 Inhaler 3   apixaban (ELIQUIS) 5 MG TABS tablet Take 1 tablet (5 mg total) by mouth 2 (two) times daily. 180 tablet 3   calcium gluconate 500 MG tablet Take 1 tablet by mouth daily.     diltiazem (CARDIZEM CD) 180 MG 24 hr capsule Take 1 capsule (180 mg total) by mouth daily. 90 capsule 3   doxycycline (ADOXA) 100 MG tablet Take 1 tablet (100 mg total) by mouth 2 (two) times daily. For 10 days only. 20 tablet 0   ferrous sulfate 325 (65 FE) MG tablet Take 325 mg by mouth daily.      furosemide (LASIX) 40 MG tablet Take 40 mg by mouth daily.      potassium chloride (K-DUR,KLOR-CON) 10 MEQ tablet Take 10 mEq by mouth  daily.      Respiratory Therapy Supplies (FLUTTER) DEVI Use as directed. 1 each 0   sodium chloride (OCEAN) 0.65 % SOLN nasal spray Place 1 spray into both nostrils 3 (three) times daily as needed for congestion.     No facility-administered medications prior to visit.      Review of Systems:   Constitutional:   No  weight loss, night sweats,  Fevers, chills, + fatigue, or  lassitude.  HEENT:   No headaches,  Difficulty swallowing,  Tooth/dental problems, or  Sore throat,                No sneezing, itching, ear ache, nasal congestion, post nasal drip,   CV:  No chest pain,  Orthopnea, PND, +swelling in lower extremities, anasarca, dizziness, palpitations, syncope.   GI  No heartburn, indigestion, abdominal pain, nausea, vomiting, diarrhea, change in bowel habits, loss of appetite, bloody stools.   Resp:  .  No chest wall deformity  Skin: no rash  or lesions.  GU: no dysuria, change in color of urine, no urgency or frequency.  No flank pain, no hematuria   MS:  No joint pain or swelling.  No decreased range of motion.  No back pain.    Physical Exam  BP 128/74 (Cuff Size: Large)    Pulse 72    Temp (!) 95.3 F (35.2 C) (Temporal)    Ht 5\' 6"  (1.676 m)    Wt 281 lb 9.6 oz (127.7 kg)    SpO2 96%    BMI 45.45 kg/m   GEN: A/Ox3; pleasant , NAD, elderly , obese    HEENT:  /AT,   NOSE-clear, THROAT-clear, no lesions, no postnasal drip or exudate noted.   NECK:  Supple w/ fair ROM; no JVD; normal carotid impulses w/o bruits; no thyromegaly or nodules palpated; no lymphadenopathy.    RESP  Clear  P & A; w/o, wheezes/ rales/ or rhonchi. no accessory muscle use, no dullness to percussion  CARD:  RRR, no m/r/g, 1+ peripheral edema, pulses intact, no cyanosis or clubbing.  GI:   Soft & nt; nml bowel sounds; no organomegaly or masses detected.   Musco: Warm bil, no deformities or joint swelling noted.   Neuro: alert, no focal deficits noted.    Skin: Warm, no lesions or  rashes    Lab Results:  CBC   BNP  Imaging: Nm Pet Image Initial (pi) Skull Base To Thigh  Result Date: 03/27/2019 CLINICAL DATA:  Subsequent treatment strategy for non-Hodgkin's lymphoma. Shortness of breath. EXAM: NUCLEAR MEDICINE PET SKULL BASE TO THIGH TECHNIQUE: 13.9 mCi F-18 FDG was injected intravenously. Full-ring PET imaging was performed from the skull base to thigh after the radiotracer. CT data was obtained and used for attenuation correction and anatomic localization. Fasting blood glucose: 103 mg/dl COMPARISON:  CTA chest dated 01/07/2019. FINDINGS: Mediastinal blood pool activity: SUV max 3.1 Liver activity: SUV max 4.7 NECK: Bilateral cervical lymphadenopathy. Representative 10 mm short axis right level 2/3 node (series 4/image 35) demonstrates max SUV 20.2. Bilateral supraclavicular lymphadenopathy. Representative 12 mm short axis left supraclavicular node at the thoracic inlet (series 4/image 43) demonstrates max SUV 9.5. Incidental CT findings: none CHEST: Multifocal thoracic lymphadenopathy, including mediastinal, hilar, and bilateral axillary nodes. Representative nodes include: --4 mm short axis right paratracheal node (series 4/image 64), max SUV 5.4 --18 mm short axis subcarinal node (series 4/image 71), max SUV 10.3 --5 mm short axis left axillary node (series 4/image 62), max SUV 6.2 No suspicious pulmonary nodules. Incidental CT findings: Atherosclerotic calcifications of the aortic arch. Coronary atherosclerosis of the LAD and left circumflex. ABDOMEN/PELVIS: Spleen is normal in size. Representative SUV 5.6, without focal lesion. No abnormal hypermetabolism in the liver, pancreas, or adrenal glands. Multifocal abdominopelvic lymphadenopathy, including upper abdominal, retroperitoneal, and bilateral pelvic/inguinal nodes. Representative nodes include: --13 mm short axis portacaval node (series 4/image 112), max SUV 10.7 --8 mm short axis left para-aortic node (series 4/image  127), max SUV 11.1 --14 mm short axis left external iliac node (series 4/image 165), max SUV 9.9 --2.8 cm short axis right inguinal node (series 4/image 131), max SUV 5.9 Incidental CT findings: Cholelithiasis. 3.0 cm benign left adrenal myelolipoma. Atherosclerotic calcifications the abdominal aorta and branch vessels. Sigmoid diverticulosis, without evidence of diverticulitis. SKELETON: Heterogeneous osseous hypermetabolism raises concern for lymphomatous involvement. For example, focal hypermetabolism in the left iliac bone, max SUV 5.5. Incidental CT findings: Degenerative changes of the visualized thoracolumbar spine. IMPRESSION: Extensive lymphadenopathy in the neck, chest,  abdomen, and pelvis, as above. Deauville criteria 5. Heterogeneous osseous hypermetabolism raises concern for lymphomatous involvement, including focal hypermetabolism in the left iliac bone. Spleen is normal in size. Electronically Signed   By: Julian Hy M.D.   On: 03/27/2019 12:51      PFT Results Latest Ref Rng & Units 12/19/2012  FVC-Predicted Pre % 88  FVC-Post L 2.43  FVC-Predicted Post % 84  Pre FEV1/FVC % % 71  Post FEV1/FCV % % 74  FEV1-Pre L 1.81  FEV1-Predicted Pre % 84  FEV1-Post L 1.80  DLCO UNC% % 56  DLCO COR %Predicted % 64  TLC L 4.80  TLC % Predicted % 89  RV % Predicted % 102    No results found for: NITRICOXIDE      Assessment & Plan:   Lung nodule No suspicious pulmonary nodules on PET scan.  However did have extensive multifocal thoracic lymphadenopathy and cervical adenopathy. Has a history of lymphoma.  Patient has a follow-up appointment with oncology this week.    Plan  Patient Instructions  Continue on CPAP at bedtime Work on healthy weight  Continue on flutter valve 3 times daily As needed   Mucinex twice daily as needed for cough or congestion May use albuterol nebulizer as needed  Follow up with Oncology as planned this week   Flu shot today .   Follow-up  with Dr. Elsworth Soho or Tawney Vanorman NP in 3 months and As needed   Please contact office for sooner follow up if symptoms do not improve or worsen or seek emergency care       OSA (obstructive sleep apnea) Excellent control on CPAP  Continue on CPAP.  Work on healthy weight.  Do not drive if sleepy     [name], NP 04/17/2019

## 2019-04-17 NOTE — Assessment & Plan Note (Signed)
No suspicious pulmonary nodules on PET scan.  However did have extensive multifocal thoracic lymphadenopathy and cervical adenopathy. Has a history of lymphoma.  Patient has a follow-up appointment with oncology this week.    Plan  Patient Instructions  Continue on CPAP at bedtime Work on healthy weight  Continue on flutter valve 3 times daily As needed   Mucinex twice daily as needed for cough or congestion May use albuterol nebulizer as needed  Follow up with Oncology as planned this week   Flu shot today .   Follow-up with Dr. Elsworth Soho or Tykisha Areola NP in 3 months and As needed   Please contact office for sooner follow up if symptoms do not improve or worsen or seek emergency care

## 2019-04-17 NOTE — Assessment & Plan Note (Signed)
Excellent control on CPAP  Continue on CPAP.  Work on healthy weight.  Do not drive if sleepy

## 2019-04-17 NOTE — Telephone Encounter (Signed)
Asking if she can come in with patient for her appointment tomorrow. Confirmed that patient is ambulatory or can use w/c in office and she has not cognitive issues. Informed her that she is not allowed, however we can put her on speaker phone for the visit. Informed patient of this as well and she agrees.

## 2019-04-17 NOTE — Patient Instructions (Addendum)
Continue on CPAP at bedtime Work on healthy weight  Continue on flutter valve 3 times daily As needed   Mucinex twice daily as needed for cough or congestion May use albuterol nebulizer as needed  Follow up with Oncology as planned this week   Flu shot today .   Follow-up with Dr. Elsworth Soho or Reeda Soohoo NP in 3 months and As needed   Please contact office for sooner follow up if symptoms do not improve or worsen or seek emergency care

## 2019-04-17 NOTE — Assessment & Plan Note (Signed)
Recent exacerbation now resolved Currently under control.  Continue on current regimen  Plan  Patient Instructions  Continue on CPAP at bedtime Work on healthy weight  Continue on flutter valve 3 times daily As needed   Mucinex twice daily as needed for cough or congestion May use albuterol nebulizer as needed  Follow up with Oncology as planned this week   Flu shot today .   Follow-up with Dr. Elsworth Soho or Kaytie Ratcliffe NP in 3 months and As needed   Please contact office for sooner follow up if symptoms do not improve or worsen or seek emergency care

## 2019-04-18 ENCOUNTER — Inpatient Hospital Stay: Payer: Medicare Other | Attending: Oncology | Admitting: Oncology

## 2019-04-18 ENCOUNTER — Telehealth: Payer: Self-pay | Admitting: Oncology

## 2019-04-18 ENCOUNTER — Inpatient Hospital Stay: Payer: Medicare Other

## 2019-04-18 ENCOUNTER — Other Ambulatory Visit: Payer: Self-pay

## 2019-04-18 VITALS — BP 122/52 | HR 72 | Temp 97.8°F | Resp 18 | Ht 66.0 in | Wt 280.8 lb

## 2019-04-18 DIAGNOSIS — D649 Anemia, unspecified: Secondary | ICD-10-CM | POA: Diagnosis not present

## 2019-04-18 DIAGNOSIS — C880 Waldenstrom macroglobulinemia: Secondary | ICD-10-CM | POA: Diagnosis not present

## 2019-04-18 DIAGNOSIS — I4891 Unspecified atrial fibrillation: Secondary | ICD-10-CM | POA: Diagnosis not present

## 2019-04-18 DIAGNOSIS — Z9221 Personal history of antineoplastic chemotherapy: Secondary | ICD-10-CM | POA: Insufficient documentation

## 2019-04-18 DIAGNOSIS — R918 Other nonspecific abnormal finding of lung field: Secondary | ICD-10-CM | POA: Insufficient documentation

## 2019-04-18 DIAGNOSIS — M1712 Unilateral primary osteoarthritis, left knee: Secondary | ICD-10-CM | POA: Insufficient documentation

## 2019-04-18 DIAGNOSIS — G4730 Sleep apnea, unspecified: Secondary | ICD-10-CM | POA: Insufficient documentation

## 2019-04-18 DIAGNOSIS — Z8673 Personal history of transient ischemic attack (TIA), and cerebral infarction without residual deficits: Secondary | ICD-10-CM | POA: Insufficient documentation

## 2019-04-18 LAB — CMP (CANCER CENTER ONLY)
ALT: 11 U/L (ref 0–44)
AST: 14 U/L — ABNORMAL LOW (ref 15–41)
Albumin: 3.8 g/dL (ref 3.5–5.0)
Alkaline Phosphatase: 81 U/L (ref 38–126)
Anion gap: 9 (ref 5–15)
BUN: 19 mg/dL (ref 8–23)
CO2: 26 mmol/L (ref 22–32)
Calcium: 9.8 mg/dL (ref 8.9–10.3)
Chloride: 107 mmol/L (ref 98–111)
Creatinine: 1.37 mg/dL — ABNORMAL HIGH (ref 0.44–1.00)
GFR, Est AFR Am: 40 mL/min — ABNORMAL LOW (ref >60)
GFR, Estimated: 35 mL/min — ABNORMAL LOW (ref >60)
Glucose, Bld: 117 mg/dL — ABNORMAL HIGH (ref 70–99)
Potassium: 4 mmol/L (ref 3.5–5.1)
Sodium: 142 mmol/L (ref 135–145)
Total Bilirubin: 0.7 mg/dL (ref 0.3–1.2)
Total Protein: 7.4 g/dL (ref 6.5–8.1)

## 2019-04-18 LAB — CBC WITH DIFFERENTIAL (CANCER CENTER ONLY)
Abs Immature Granulocytes: 0.01 10*3/uL (ref 0.00–0.07)
Basophils Absolute: 0 10*3/uL (ref 0.0–0.1)
Basophils Relative: 0 %
Eosinophils Absolute: 0.2 10*3/uL (ref 0.0–0.5)
Eosinophils Relative: 3 %
HCT: 34.7 % — ABNORMAL LOW (ref 36.0–46.0)
Hemoglobin: 11.5 g/dL — ABNORMAL LOW (ref 12.0–15.0)
Immature Granulocytes: 0 %
Lymphocytes Relative: 25 %
Lymphs Abs: 1.3 10*3/uL (ref 0.7–4.0)
MCH: 29.2 pg (ref 26.0–34.0)
MCHC: 33.1 g/dL (ref 30.0–36.0)
MCV: 88.1 fL (ref 80.0–100.0)
Monocytes Absolute: 0.3 10*3/uL (ref 0.1–1.0)
Monocytes Relative: 6 %
Neutro Abs: 3.4 10*3/uL (ref 1.7–7.7)
Neutrophils Relative %: 66 %
Platelet Count: 330 10*3/uL (ref 150–400)
RBC: 3.94 MIL/uL (ref 3.87–5.11)
RDW: 16.1 % — ABNORMAL HIGH (ref 11.5–15.5)
WBC Count: 5.3 10*3/uL (ref 4.0–10.5)
nRBC: 0 % (ref 0.0–0.2)

## 2019-04-18 LAB — LACTATE DEHYDROGENASE: LDH: 150 U/L (ref 98–192)

## 2019-04-18 NOTE — Telephone Encounter (Signed)
Scheduled per 09/15 los, patient is notified by My chart.

## 2019-04-18 NOTE — Progress Notes (Signed)
[place] OFFICE PROGRESS NOTE   Diagnosis: Waldenstroms Macroglobulinemia  INTERVAL HISTORY:   Ms. Seaward returns for a scheduled visit.  She is followed by Dr. Elsworth Soho for bronchiectasis.  She was referred for a PET scan to evaluate an enlarging left lung nodule.  The PET scan on 03/27/2019 revealed no suspicious pulmonary nodules.  Diffuse hypermetabolic nodes were noted in the neck, chest, abdomen, and pelvis.  Heterogenous osseous hypermetabolism may represent lymphomatous involvement.  The spleen is normal in size and without focal lesion.  She feels well at present.  No fever, night sweats, or anorexia.  She reports increased somnolence.  She was admitted with atrial fibrillation and respiratory failure in June.  She is now maintained on apixaban.  Objective:  Vital signs in last 24 hours:  Blood pressure (!) 122/52, pulse 72, temperature 97.8 F (36.6 C), temperature source Oral, resp. rate 18, height 5\' 6"  (1.676 m), weight 280 lb 12.8 oz (127.4 kg), SpO2 97 %.    HEENT: Neck without mass Lymphatics: No cervical, supraclavicular, or axillary nodes Resp: End inspiratory rhonchi at the posterior base bilaterally, no respiratory distress Cardio: Regular rate and rhythm GI: No hepatosplenomegaly, nontender Vascular: No leg edema   Portacath/PICC-without erythema  Lab Results:  Lab Results  Component Value Date   WBC 5.3 04/18/2019   HGB 11.5 (L) 04/18/2019   HCT 34.7 (L) 04/18/2019   MCV 88.1 04/18/2019   PLT 330 04/18/2019   NEUTROABS 3.4 04/18/2019    CMP  Lab Results  Component Value Date   NA 144 03/14/2019   K 4.7 03/14/2019   CL 101 03/14/2019   CO2 25 03/14/2019   GLUCOSE 101 (H) 03/14/2019   BUN 18 03/14/2019   CREATININE 1.43 (H) 03/14/2019   CALCIUM 10.4 (H) 03/14/2019   PROT 6.9 01/06/2019   ALBUMIN 3.3 (L) 01/06/2019   AST 25 01/06/2019   ALT 15 01/06/2019   ALKPHOS 94 01/06/2019   BILITOT 1.0 01/06/2019   GFRNONAA 33 (L)  03/14/2019   GFRAA 39 (L) 03/14/2019     Medications: I have reviewed the patient's current medications.   Assessment/Plan: 1. 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 Treated with multiple systemic therapies includingpentostatin/Cytoxan/rituximab in 2010.   Cycle 1 bendamustine/Rituxan 05/31/2014.  Cycle 2 bendamustine/Rituxan 07/02/2014  Cycle 3 bendamustine/rituximab 07/30/2014  Cycle 4 bendamustine/rituximab 09/10/2014  Cycle 5 bendamustine/rituximab 10/22/2014  Cycle 6 bendamustine/rituximab 12/10/2014  PET scan 03/27/2019- diffuse small hypermetabolic lymph nodes in the neck, chest, abdomen, and pelvis, heterogenous hypermetabolism in the bones 2. Left knee arthritis. Followed by Dr. Wynelle Link. 3. Sleep apnea. 4. Anemia. microcytic with peripheral blood smear findings consistent with iron deficiency 07/09/2015, serum iron studies consistent with "anemia of chronic disease ", stool Hemoccult cards negative 07/11/2015 5. Question left parotitis 12/12/2013 presenting with left facial and parotid edema. 6. Admission with pneumonia February 2016 7. History of Neutropenia secondary to chemotherapy, now with moderate neutropenia 8. Left lung pneumonia 04/02/2015, sputum culture positive for Moraxella 06/19/2015 9. Low IgG and IgA 10. CT chest 05/13/2015 with bilateral lower lung consolidation 11. CT chest, abdomen, and pelvis 01/08/2016-new peribronchial thickening and peripheral tree in bud pattern at the right lower lobe, no lymphadenopathy 12. Sinus mucosal thickening/opacification on CT 08/16/2015-status post sinus surgery 12/05/2015 13. Sputum culture positive for pseudomonas aeruginosa on 01/10/2016-treated with 10  days of ciprofloxacin 14. 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 Admission with pneumonia 08/26/2016 16.  Left lung nodule increased in size on a CT 01/06/2019, not hypermetabolic on the PET scan 0000000, followed by Dr. Eartha Inch   Disposition: Ms. Illa appears unchanged.  A PET scan ordered for evaluation of a lung nodule reveals diffuse hypermetabolic lymphadenopathy.  The lymph nodes are small.  It is very likely the lymph nodes are related to the lymphoplasmacytic lymphoma.  She does not appear to have symptoms related to lymphoma.  I discussed the PET findings and treatment options with Ms. Woodrow.  Her daughter was present by telephone for today's visit.  I reviewed the PET images.  There is no indication for treating the lymphoma at present.  We will initiate salvage systemic therapy if she develops clear symptoms related to lymphoma, progressive cytopenias, or a marked rise in the [name] level.  We will follow-up on the [name] level from today.  She will return to the office and lab visit in 6 months.  Ms. Ignacio will contact us in the interim for new symptoms.  25 minutes were spent with the patient today.  The majority of the time was used for counseling and coordination of care.  [name], MD  04/18/2019  11:26 AM

## 2019-04-19 ENCOUNTER — Telehealth: Payer: Self-pay | Admitting: *Deleted

## 2019-04-19 LAB — PROTEIN ELECTROPHORESIS, SERUM
A/G Ratio: 1.1 (ref 0.7–1.7)
Albumin ELP: 3.6 g/dL (ref 2.9–4.4)
Alpha-1-Globulin: 0.2 g/dL (ref 0.0–0.4)
Alpha-2-Globulin: 0.8 g/dL (ref 0.4–1.0)
Beta Globulin: 0.6 g/dL — ABNORMAL LOW (ref 0.7–1.3)
Gamma Globulin: 1.6 g/dL (ref 0.4–1.8)
Globulin, Total: 3.2 g/dL (ref 2.2–3.9)
M-Spike, %: 1.2 g/dL — ABNORMAL HIGH
Total Protein ELP: 6.8 g/dL (ref 6.0–8.5)

## 2019-04-19 LAB — IGM: IgM (Immunoglobulin M), Srm: 1832 mg/dL — ABNORMAL HIGH (ref 26–217)

## 2019-04-19 NOTE — Telephone Encounter (Signed)
-----   Message from Ladell Pier, MD sent at 04/19/2019  3:50 PM EDT ----- Please call patient, [name] is slightly higher but not significantly changed past year, f/u as scheduled

## 2019-04-19 NOTE — Telephone Encounter (Signed)
Per Dr. Benay Spice, called regarding slightly high [name] results. Pt verbalized understanding and will f/u as scheduled

## 2019-04-24 DIAGNOSIS — H00031 Abscess of right upper eyelid: Secondary | ICD-10-CM | POA: Diagnosis not present

## 2019-05-02 ENCOUNTER — Ambulatory Visit: Payer: Medicare Other | Admitting: Nurse Practitioner

## 2019-05-16 DIAGNOSIS — R109 Unspecified abdominal pain: Secondary | ICD-10-CM | POA: Diagnosis not present

## 2019-05-16 DIAGNOSIS — R509 Fever, unspecified: Secondary | ICD-10-CM | POA: Diagnosis not present

## 2019-05-16 DIAGNOSIS — Z8719 Personal history of other diseases of the digestive system: Secondary | ICD-10-CM | POA: Diagnosis not present

## 2019-06-20 ENCOUNTER — Ambulatory Visit (INDEPENDENT_AMBULATORY_CARE_PROVIDER_SITE_OTHER): Payer: Medicare Other | Admitting: Adult Health

## 2019-06-20 ENCOUNTER — Other Ambulatory Visit: Payer: Self-pay

## 2019-06-20 DIAGNOSIS — R0600 Dyspnea, unspecified: Secondary | ICD-10-CM

## 2019-06-20 DIAGNOSIS — R911 Solitary pulmonary nodule: Secondary | ICD-10-CM

## 2019-06-20 DIAGNOSIS — R0609 Other forms of dyspnea: Secondary | ICD-10-CM

## 2019-06-20 DIAGNOSIS — G4733 Obstructive sleep apnea (adult) (pediatric): Secondary | ICD-10-CM | POA: Diagnosis not present

## 2019-06-20 DIAGNOSIS — R5381 Other malaise: Secondary | ICD-10-CM | POA: Diagnosis not present

## 2019-06-20 DIAGNOSIS — J479 Bronchiectasis, uncomplicated: Secondary | ICD-10-CM | POA: Diagnosis not present

## 2019-06-20 NOTE — Patient Instructions (Signed)
Continue on CPAP at bedtime Adjust CPAP pressure to 8 to 14 cmH20.  Work on healthy weight Do not drive if sleepy .  Change mask as discussed   Continue on flutter valve 3 times daily As needed   Mucinex twice daily as needed for cough or congestion May use Albuterol Neb Twice daily and As needed    Follow up with Oncology as planned.   Follow-up with Dr. Elsworth Soho or Sherryl Valido NP in 3 -4 months and As needed   Please contact office for sooner follow up if symptoms do not improve or worsen or seek emergency care

## 2019-06-20 NOTE — Progress Notes (Signed)
Virtual Visit via Telephone Note  I connected with Natasha Ellis on 06/20/19 at  3:00 PM EST by telephone and verified that I am speaking with the correct person using two identifiers.  Location: Patient: Home  Provider: Office    I discussed the limitations, risks, security and privacy concerns of performing an evaluation and management service by telephone and the availability of in person appointments. I also discussed with the patient that there may be a patient responsible charge related to this service. The patient expressed understanding and agreed to proceed.   History of Present Illness: 85 year old former female former smoker followed for recurrent pneumonia, bronchiectasis, lung nodule ,chronic cough, obstructive sleep apnea on CPAP.  She is colonized with pansensitive Pseudomonas. 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  Treated with multiple systemic therapies last in 2016.  Mingo Amber Strom's macroglobulinemia Previously on nocturnal oxygen discontinued in September 2013 Chronic sinus disease status post sinus surgery 2017 (Dr. [name])  Today's televisit is a 83-month follow-up.  Patient is followed for bronchiectasis, lung nodule and obstructive sleep apnea Last visit patient had been noted to have an enlarging lung nodule on CT scan.  She was set up for a PET scan that was done in June that showed no suspicious pulmonary nodules.  But diffuse lymphadenopathy.  Patient is followed by oncology for known lymphoma.  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  She is had no increased  cough or wheezing.  Says that she does have good days and bad days.  Says she gets winded with heavy activity especially if she goes up the incline.  I previously been using albuterol nebulizers which do help.  She says she is cut back on that to once a day.  Feels that she might need to go back up to twice a day.  She is trying to be more active and walk more.  Patient remains on CPAP at bedtime.  Says she is doing well with CPAP.  She denies any daytime sleepiness.  She says she wears it every night and never misses a night.  She does feel at times that that she gets a gasping sensation in the middle the night.  CPAP download shows excellent compliance with daily average usage at 100%.  She uses it 10.5 hours.  She is on auto CPAP 8 to 15 cm H2O.  AHI is 3/hour.  Positive leaks.  She denies any fever, chest pain, orthopnea, edema, nausea vomiting diarrhea.   Observations/Objective: 08/2015 CT sinus >>Extensive multifocal paranasal sinus disease, most marked in the ethmoid air cell complexes and in the left maxillary antrum  05/2015 CT chest showed bilateral lower lobe consolidation  10/2015 ONO shows minimal desaturations; does not need O2.  01/2016 pansensitive pseudomonas in sputum   PFTs 12/2012 No airway obstruction ratio 74, FEV1 84%, DLCO 56%  spirometry 5/2018does not show any airway obstruction, mild restriction  CT chest August 31, 2018 showed no ILD. Mid to lower lung zone bronchiectasis. And a 12 mm left lower lobe lung nodule.   09/2018 VQ scan that showed very low probability of pulmonary embolism.  12/2018 Echo w/ normal LVEF and stress Myoview was low risk  CT angiogram 01/2019 showed interval increase in left lower lobe nodule up to 1.7 cm compared to CT from 08/2018 where this was 12 mm. On CT scan 01/2016, this was 7 mm.    Subsequent PET scan showed bilateral cervical lymphadenopathy, supraclavicular lymphadenopathy,Multifocal thoracic lymphadenopathy  including mediastinal, hilar and bilateral axillary nodesNo suspicious pulmonary nodules .  Multifocal abdominal pelvic lymphadenopathy.Heterogeneous osseous hypermetabolism with focal hypermetabolism in the left iliac bone.  Assessment and Plan: Obstructive sleep apnea excellent control and compliance on CPAP.  Will adjust pressure slightly for comfort.  Patient is encouraged to change mask and equipment as indicated.  Bronchiectasis appears to be under good control-does get winded with activity.  Suspect this is multifactorial.  Have asked her to increase her albuterol to twice daily and use flutter valve if needed.  If not improving will need to come into the office for further evaluation including labs and chest x-ray  Previous pulmonary nodule was not noted on PET scan.  She does have extensive multifocal thoracic lymphadenopathy and cervical adenopathy.  She does have a history of lymphoma.  She is continue her follow-up with oncology.  Plan  Patient Instructions  Continue on CPAP at bedtime Adjust CPAP pressure to 8 to 14 cmH20.  Work on healthy weight Do not drive if sleepy .  Change mask as discussed   Continue on flutter valve 3 times daily As needed   Mucinex twice daily as needed for cough or congestion May use Albuterol Neb Twice daily and As needed    Follow up with Oncology as planned.   Follow-up with Dr. Elsworth Soho or Altus Zaino NP in 3 -4 months and As needed   Please contact office for sooner follow up if symptoms do not improve or worsen or seek emergency care        Follow Up Instructions:   Follow-up in 3 months and as needed   Please contact office for sooner follow up if symptoms do not improve or worsen or seek emergency care   I discussed the assessment and treatment plan with the patient. The patient was provided an opportunity to ask questions and all were answered. The patient agreed with the plan and demonstrated an understanding of the instructions.   The  patient was advised to call back or seek an in-person evaluation if the symptoms worsen or if the condition fails to improve as anticipated.  I provided 28  minutes of non-face-to-face time during this encounter.   [name], NP

## 2019-08-01 ENCOUNTER — Other Ambulatory Visit: Payer: Self-pay | Admitting: *Deleted

## 2019-08-01 ENCOUNTER — Telehealth: Payer: Self-pay | Admitting: *Deleted

## 2019-08-01 NOTE — Telephone Encounter (Signed)
Facility she lives in is giving the COVID vaccine tomorrow. Asking if she should take it? Per Dr. Benay Spice: Yes, definitely

## 2019-08-16 DIAGNOSIS — C44712 Basal cell carcinoma of skin of right lower limb, including hip: Secondary | ICD-10-CM | POA: Diagnosis not present

## 2019-08-17 DIAGNOSIS — M179 Osteoarthritis of knee, unspecified: Secondary | ICD-10-CM | POA: Diagnosis not present

## 2019-08-17 DIAGNOSIS — M25562 Pain in left knee: Secondary | ICD-10-CM | POA: Diagnosis not present

## 2019-08-24 DIAGNOSIS — M1712 Unilateral primary osteoarthritis, left knee: Secondary | ICD-10-CM | POA: Diagnosis not present

## 2019-08-24 DIAGNOSIS — M179 Osteoarthritis of knee, unspecified: Secondary | ICD-10-CM | POA: Diagnosis not present

## 2019-09-12 ENCOUNTER — Ambulatory Visit (INDEPENDENT_AMBULATORY_CARE_PROVIDER_SITE_OTHER): Payer: Medicare Other | Admitting: Adult Health

## 2019-09-12 ENCOUNTER — Other Ambulatory Visit: Payer: Self-pay

## 2019-09-12 ENCOUNTER — Encounter: Payer: Self-pay | Admitting: Adult Health

## 2019-09-12 DIAGNOSIS — G4733 Obstructive sleep apnea (adult) (pediatric): Secondary | ICD-10-CM | POA: Diagnosis not present

## 2019-09-12 DIAGNOSIS — R0602 Shortness of breath: Secondary | ICD-10-CM

## 2019-09-12 NOTE — Addendum Note (Signed)
Addended by: Parke Poisson E on: 09/12/2019 04:49 PM   Modules accepted: Orders

## 2019-09-12 NOTE — Progress Notes (Signed)
Virtual Visit via Telephone Note  I connected with Natasha Ellis on 09/12/19 at  3:00 PM EST by telephone and verified that I am speaking with the correct person using two identifiers.  Location: Patient: Home  Provider: Office    I discussed the limitations, risks, security and privacy concerns of performing an evaluation and management service by telephone and the availability of in person appointments. I also discussed with the patient that there may be a patient responsible charge related to this service. The patient expressed understanding and agreed to proceed.   History of Present Illness: 86 year old former female former smoker followed for recurrent pneumonia, bronchiectasis, lung nodule ,chronic cough, obstructive sleep apnea on CPAP.  She is colonized with pansensitive Pseudomonas. 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  Treated with multiple systemic therapies last in 2016.  Mingo Amber Strom's macroglobulinemia Previously on nocturnal oxygen discontinued in September 2013 Chronic sinus disease status post sinus surgery 2017 (Dr. [name])  Today's televisit is for an acute office visit. Complains of increased shortness of breath and wheezing for last 5 days .  She denies any discolored mucus.  She denies any fever, loss of taste or smell, abdominal pain, increased leg swelling, hemoptysis, chest pain. Patient says she gets winded with activity. She remains on Eliquis.  Denies any known bleeding.  She has OSA on CPAP At bedtime  . Says feels rested. Doing well on CPAP   She got covid shot yesterday    Patient Active Problem List   Diagnosis Date Noted  . CKD (chronic kidney disease) stage 3, GFR 30-59 ml/min 01/08/2019  . Physical deconditioning 09/15/2018  . Bronchiectasis ([place]) 09/06/2018  . Lung nodule 09/06/2018  .  Ascending aortic aneurysm ([place]) 09/06/2018  . Hypoxia 08/15/2016  . Severe obesity (BMI >= 40) ([place]) 07/30/2015  . Recurrent pneumonia 06/19/2015  . Anemia 06/10/2015  . Anemia due to other cause 06/10/2015  . OSA (obstructive sleep apnea) 09/20/2014  . History of lymphoma 09/20/2014  . History of TIA (transient ischemic attack) 09/20/2014  . Diarrhea   . Pain in gums 08/15/2014  . Neutropenia ([place]) 08/15/2014  . Hypersensitivity reaction 05/31/2014  . Waldenstrom's macroglobulinemia ([place]) 04/19/2014  . HTN (hypertension) 04/29/2013  . HLD (hyperlipidemia) 04/29/2013  . Lacunar infarction ([place]) 04/29/2013  . GERD (gastroesophageal reflux disease) 04/29/2013  . TIA (transient ischemic attack) 04/29/2013  . Back pain 04/29/2013  . Asthma 04/29/2013  . Dyspnea 01/25/2013  . Chronic cough 01/01/2013   Current Outpatient Medications on File Prior to Visit  Medication Sig Dispense Refill  . acetaminophen (TYLENOL) 500 MG tablet Take 1,000 mg by mouth every 6 (six) hours as needed for pain.     Marland Kitchen albuterol (PROAIR HFA) 108 (90 Base) MCG/ACT inhaler Inhale 2 puffs into the lungs every 6 (six) hours as needed for wheezing or shortness of breath. 1 Inhaler 3  . Albuterol Sulfate 2.5 MG/0.5ML NEBU Inhale 2.5 mg into the lungs daily.     Marland Kitchen apixaban (ELIQUIS) 5 MG TABS tablet Take 1 tablet (5 mg total) by mouth 2 (two) times daily. 180 tablet 3  . calcium gluconate 500 MG tablet Take 1 tablet by mouth daily.    Marland Kitchen diltiazem (CARDIZEM CD) 180 MG 24 hr capsule Take 1 capsule (180 mg total) by mouth daily. 90 capsule 3  . doxycycline (ADOXA) 100 MG tablet Take 1  tablet (100 mg total) by mouth 2 (two) times daily. For 10 days only. 20 tablet 0  . ferrous sulfate 325 (65 FE) MG tablet Take 325 mg by mouth daily.     . furosemide (LASIX) 40 MG tablet Take 40 mg by mouth daily.     . potassium chloride (K-DUR,KLOR-CON) 10 MEQ tablet Take 10 mEq by mouth daily.     Marland Kitchen Respiratory Therapy Supplies  (FLUTTER) DEVI Use as directed. 1 each 0  . sodium chloride (OCEAN) 0.65 % SOLN nasal spray Place 1 spray into both nostrils 3 (three) times daily as needed for congestion.     No current facility-administered medications on file prior to visit.      Observations/Objective: 09/12/2019 O2 sat 96% on room air today   Assessment and Plan: Dyspnea questionable etiology.  Patient has multiple comorbidities.  O2 saturations are adequate on room air at 96%.  Of asked patient to return to the office tomorrow for labs and chest x-ray.  Will evaluate treatment options during that visit. Advised if symptoms worsen overnight she is to seek emergency care.  Plan  Patient Instructions  Continue on flutter valve 3 times daily As needed   Mucinex twice daily as needed for cough or congestion May use albuterol nebulizer as needed Return for Chest xray and labs tomorrow.  Follow up tomorrow at 930 and As needed   Please contact office for sooner follow up if symptoms do not improve or worsen or seek emergency care       Follow Up Instructions: Follow-up in 1 day and as needed  Please contact office for sooner follow up if symptoms do not improve or worsen or seek emergency care     I discussed the assessment and treatment plan with the patient. The patient was provided an opportunity to ask questions and all were answered. The patient agreed with the plan and demonstrated an understanding of the instructions.   The patient was advised to call back or seek an in-person evaluation if the symptoms worsen or if the condition fails to improve as anticipated.  I provided 22 minutes of non-face-to-face time during this encounter.   [name], NP

## 2019-09-12 NOTE — Patient Instructions (Addendum)
Continue on flutter valve 3 times daily As needed   Mucinex twice daily as needed for cough or congestion May use albuterol nebulizer as needed Return for Chest xray and labs tomorrow.  Follow up tomorrow at 930 and As needed   Please contact office for sooner follow up if symptoms do not improve or worsen or seek emergency care

## 2019-09-13 ENCOUNTER — Ambulatory Visit (INDEPENDENT_AMBULATORY_CARE_PROVIDER_SITE_OTHER): Payer: Medicare Other | Admitting: Adult Health

## 2019-09-13 ENCOUNTER — Encounter: Payer: Self-pay | Admitting: Adult Health

## 2019-09-13 ENCOUNTER — Ambulatory Visit (INDEPENDENT_AMBULATORY_CARE_PROVIDER_SITE_OTHER): Payer: Medicare Other

## 2019-09-13 ENCOUNTER — Other Ambulatory Visit: Payer: Self-pay

## 2019-09-13 DIAGNOSIS — Z8579 Personal history of other malignant neoplasms of lymphoid, hematopoietic and related tissues: Secondary | ICD-10-CM | POA: Diagnosis not present

## 2019-09-13 DIAGNOSIS — R0602 Shortness of breath: Secondary | ICD-10-CM | POA: Diagnosis not present

## 2019-09-13 DIAGNOSIS — R911 Solitary pulmonary nodule: Secondary | ICD-10-CM

## 2019-09-13 DIAGNOSIS — J4520 Mild intermittent asthma, uncomplicated: Secondary | ICD-10-CM | POA: Diagnosis not present

## 2019-09-13 LAB — BASIC METABOLIC PANEL
BUN: 17 mg/dL (ref 6–23)
CO2: 27 mEq/L (ref 19–32)
Calcium: 10.1 mg/dL (ref 8.4–10.5)
Chloride: 103 mEq/L (ref 96–112)
Creatinine, Ser: 1.38 mg/dL — ABNORMAL HIGH (ref 0.40–1.20)
GFR: 36.21 mL/min — ABNORMAL LOW (ref >60.00)
Glucose, Bld: 108 mg/dL — ABNORMAL HIGH (ref 70–99)
Potassium: 4 mEq/L (ref 3.5–5.1)
Sodium: 138 mEq/L (ref 135–145)

## 2019-09-13 LAB — CBC WITH DIFFERENTIAL/PLATELET
Basophils Absolute: 0 10*3/uL (ref 0.0–0.1)
Basophils Relative: 0.7 % (ref 0.0–3.0)
Eosinophils Absolute: 0.2 10*3/uL (ref 0.0–0.7)
Eosinophils Relative: 4.1 % (ref 0.0–5.0)
HCT: 33.8 % — ABNORMAL LOW (ref 36.0–46.0)
Hemoglobin: 11.2 g/dL — ABNORMAL LOW (ref 12.0–15.0)
Lymphocytes Relative: 25.3 % (ref 12.0–46.0)
Lymphs Abs: 1.2 10*3/uL (ref 0.7–4.0)
MCHC: 33.2 g/dL (ref 30.0–36.0)
MCV: 85.7 fl (ref 78.0–100.0)
Monocytes Absolute: 0.3 10*3/uL (ref 0.1–1.0)
Monocytes Relative: 7 % (ref 3.0–12.0)
Neutro Abs: 2.9 10*3/uL (ref 1.4–7.7)
Neutrophils Relative %: 62.9 % (ref 43.0–77.0)
Platelets: 442 10*3/uL — ABNORMAL HIGH (ref 150.0–400.0)
RBC: 3.95 Mil/uL (ref 3.87–5.11)
RDW: 16.1 % — ABNORMAL HIGH (ref 11.5–15.5)
WBC: 4.5 10*3/uL (ref 4.0–10.5)

## 2019-09-13 LAB — BRAIN NATRIURETIC PEPTIDE: Pro B Natriuretic peptide (BNP): 133 pg/mL — ABNORMAL HIGH (ref 0.0–100.0)

## 2019-09-13 MED ORDER — PREDNISONE 10 MG PO TABS: ORAL_TABLET | ORAL | 0 refills | Status: DC

## 2019-09-13 NOTE — Patient Instructions (Addendum)
Take Extra Lasix 40mg  today x 1 .  Prednisone 20mg  daily for 5 days .   Continue on flutter valve 3 times daily As needed   Mucinex twice daily as needed for cough or congestion May use Albuterol Neb Twice daily and As needed    Follow-up with Dr. Elsworth Soho or Ajanee Buren NP in 3 -4 weeks  and As needed   Please contact office for sooner follow up if symptoms do not improve or worsen or seek emergency care   I will call with lab results.

## 2019-09-13 NOTE — Progress Notes (Signed)
@Patient  ID: Natasha Ellis, female    DOB: 1931-04-17, 86 y.o.   MRN: [id number]    Referring provider: Lavone Orn, MD  HPI: 86 year old female former smoker followed for recurrent pneumonia, bronchiectasis, lung nodules, chronic cough and obstructive sleep apnea on nocturnal CPAP.  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  Treated with multiple systemic therapies last in 2016 previously on nocturnal oxygen discontinued in September 2013 chronic sinus disease status post sinus surgery 2017 (Dr. [name])  TEST/EVENTS :  High-resolution CT chest August 31, 2018 - for fibrotic interstitial lung disease, mid and lower lung bronchiectasis likely postinfectious in etiology.  12 mm medial left lower lobe nodule enlarged from 2017.  CTA January 07, 2019 - for PE, interval increase in left lower lobe lung nodule to 1.7 cm  PET scan March 27, 2019 extensive lymphadenopathy in the neck chest abdomen pelvis, hypermetabolism, no suspicious pulmonary nodules   09/13/2019 follow-up dyspnea patient returns for a follow-up of increased shortness of breath and wheezing over the last several days.  She denies any significant cough congestion.  She has no fever, loss of taste of smell or known sick contacts.  Patient says she has noticed that her breathing has been more short of breath recently.  She gets winded with minimal activity.  She denies any chest pain orthopnea, abdominal pain nausea vomiting diarrhea.  Has had some ankle swelling but is chronic for her.  Today chest x-ray is clear with no acute findings noted. Patient received her second Covid vaccine on September 11, 2019.   Allergies  Allergen Reactions  . Codeine Other (See Comments)    Crazy-nightmares  . Amoxicillin Diarrhea    Has patient had a  PCN reaction causing immediate rash, facial/tongue/throat swelling, SOB or lightheadedness with hypotension:  No Has patient had a PCN reaction causing severe rash involving mucus membranes or skin necrosis: No Has patient had a PCN reaction that required hospitalization No Has patient had a PCN reaction occurring within the last 10 years: Unknown--rx is diarrhea. If all of the above answers are "NO", then may proceed with Cephalosporin use.    . Cefdinir Cough  . Doxycycline Diarrhea  . Erythromycin Other (See Comments)    GI upset  . Lisinopril Cough  . Oxybutynin Other (See Comments)    Dry mouth  . Sulfa Antibiotics Other (See Comments)    GI upset    Immunization History  Administered Date(s) Administered  . Fluad Quad(high Dose 65+) 04/17/2019  . Influenza Whole 05/09/2012  . Influenza, High Dose Seasonal PF 05/24/2017  . Influenza,inj,Quad PF,6+ Mos 04/30/2013, 10/24/2015, 04/22/2016, 05/09/2018  . Influenza-Unspecified 05/03/2014  . Moderna SARS-COVID-2 Vaccination 08/15/2019, 09/11/2019  . Pneumococcal Polysaccharide-23 08/03/2009    Past Medical History:  Diagnosis Date  . Arthritis   . Asthma   . Cancer ([place])    Lymphoma  . Cataract   . Chronic cough   . Diverticulosis of intestine    H/O  . DJD (degenerative joint disease) of lumbar spine   . IBS (irritable bowel syndrome)   . Left knee DJD   . 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 Right knee DJD   . Sleep apnea     Tobacco History: Social History   Tobacco Use  Smoking Status Former Smoker  . Packs/day:  1.00  . Years: 25.00  . Pack years: 25.00  . Types: Cigarettes  . Quit date: 08/04/1979  . Years since quitting: 40.1  Smokeless Tobacco Never Used   Counseling given: Not Answered   Outpatient Medications Prior to Visit  Medication Sig Dispense Refill  . acetaminophen (TYLENOL) 500 MG tablet Take 1,000 mg by mouth every 6 (six) hours as needed for pain.      Marland Kitchen albuterol (PROAIR HFA) 108 (90 Base) MCG/ACT inhaler Inhale 2 puffs into the lungs every 6 (six) hours as needed for wheezing or shortness of breath. 1 Inhaler 3  . Albuterol Sulfate 2.5 MG/0.5ML NEBU Inhale 2.5 mg into the lungs daily.     Marland Kitchen apixaban (ELIQUIS) 5 MG TABS tablet Take 1 tablet (5 mg total) by mouth 2 (two) times daily. 180 tablet 3  . calcium gluconate 500 MG tablet Take 1 tablet by mouth daily.    Marland Kitchen diltiazem (CARDIZEM CD) 180 MG 24 hr capsule Take 1 capsule (180 mg total) by mouth daily. 90 capsule 3  . ferrous sulfate 325 (65 FE) MG tablet Take 325 mg by mouth daily.     . furosemide (LASIX) 40 MG tablet Take 40 mg by mouth daily.     . potassium chloride (K-DUR,KLOR-CON) 10 MEQ tablet Take 10 mEq by mouth daily.     Marland Kitchen Respiratory Therapy Supplies (FLUTTER) DEVI Use as directed. 1 each 0  . sodium chloride (OCEAN) 0.65 % SOLN nasal spray Place 1 spray into both nostrils 3 (three) times daily as needed for congestion.    Marland Kitchen doxycycline (ADOXA) 100 MG tablet Take 1 tablet (100 mg total) by mouth 2 (two) times daily. For 10 days only. (Patient not taking: Reported on 09/13/2019) 20 tablet 0   No facility-administered medications prior to visit.     Review of Systems:   Constitutional:   No  weight loss, night sweats,  Fevers, chills,  +fatigue, or  lassitude.  HEENT:   No headaches,  Difficulty swallowing,  Tooth/dental problems, or  Sore throat,                No sneezing, itching, ear ache, nasal congestion, post nasal drip,   CV:  No chest pain,  Orthopnea, PND, +swelling in lower extremities, anasarca, dizziness, palpitations, syncope.   GI  No heartburn, indigestion, abdominal pain, nausea, vomiting, diarrhea, change in bowel habits, loss of appetite, bloody stools.   Resp:    No chest wall deformity  Skin: no rash or lesions.  GU: no dysuria, change in color of urine, no urgency or frequency.  No flank pain, no hematuria   MS:  No joint pain or swelling.  No  decreased range of motion.  No back pain.    Physical Exam  BP 116/74 (BP Location: Right Arm, Cuff Size: Large)   Pulse 67   Temp (!) 97.1 F (36.2 C) (Temporal)   Ht 5\' 6"  (1.676 m)   Wt 283 lb 3.2 oz (128.5 kg)   SpO2 97% Comment: RA  BMI 45.71 kg/m   GEN: A/Ox3; pleasant , NAD, BMI 45   HEENT:  [place]/AT,  , NOSE-clear, THROAT-clear, no lesions, no postnasal drip or exudate noted.   NECK:  Supple w/ fair ROM; no JVD; normal carotid impulses w/o bruits; no thyromegaly or nodules palpated; no lymphadenopathy.    RESP  Clear  P & A; w/o, wheezes/ rales/ or rhonchi. no accessory muscle use, no dullness to percussion  CARD:  RRR,  no m/r/g, 1+ peripheral edema, pulses intact, no cyanosis or clubbing.  GI:   Soft & nt; nml bowel sounds; no organomegaly or masses detected.   Musco: Warm bil, no deformities or joint swelling noted.   Neuro: alert, no focal deficits noted.    Skin: Warm, no lesions or rashes    Lab Results:   BMET    Imaging: DG Chest 2 View  Result Date: 09/13/2019 CLINICAL DATA:  Shortness of breath. EXAM: CHEST - 2 VIEW COMPARISON:  March 14, 2019. FINDINGS: The heart size and mediastinal contours are within normal limits. Both lungs are clear. No pneumothorax or pleural effusion is noted. The visualized skeletal structures are unremarkable. IMPRESSION: No active cardiopulmonary disease. Electronically Signed   By: Marijo Conception M.D.   On: 09/13/2019 10:07      PFT Results Latest Ref Rng & Units 12/19/2012  FVC-Predicted Pre % 88  FVC-Post L 2.43  FVC-Predicted Post % 84  Pre FEV1/FVC % % 71  Post FEV1/FCV % % 74  FEV1-Pre L 1.81  FEV1-Predicted Pre % 84  FEV1-Post L 1.80  DLCO UNC% % 56  DLCO COR %Predicted % 64  TLC L 4.80  TLC % Predicted % 89  RV % Predicted % 102    No results found for: NITRICOXIDE      Assessment & Plan:   No problem-specific Assessment & Plan notes found for this encounter.     [name],  NP 09/13/2019

## 2019-09-14 NOTE — Assessment & Plan Note (Signed)
Suspect mild asthma flare chest x-ray is clear today.  Will check lab work including CBC BNP and be met. May have a slight component of diastolic dysfunction no overt fluid overload is noted on exam  PLAN  Patient Instructions  Take Extra Lasix 4m today x 1 .  Prednisone 262mdaily for 5 days .   Continue on flutter valve 3 times daily As needed   Mucinex twice daily as needed for cough or congestion May use Albuterol Neb Twice daily and As needed    Follow-up with Dr. AlElsworth Sohor Shadow Stiggers NP in 3 -4 weeks  and As needed   Please contact office for sooner follow up if symptoms do not improve or worsen or seek emergency care   I will call with lab results.

## 2019-09-14 NOTE — Assessment & Plan Note (Addendum)
Left lower lobe lung nodule noted-PET scan August 2020 showed no suspicious pulmonary nodules/or hypermetabolic nodule.

## 2019-09-14 NOTE — Assessment & Plan Note (Signed)
Increased dyspnea over the last several days.  Chest x-ray today is clear.  Labs are pending.  We will treat for possible asthma flare and mild diastolic dysfunction flare.  O2 saturations are normal at 97% on room air.  Patient is anticoagulated with Eliquis. Low suspicion for PE.  Plan  Patient Instructions  Take Extra Lasix 40mg  today x 1 .  Prednisone 20mg  daily for 5 days .   Continue on flutter valve 3 times daily As needed   Mucinex twice daily as needed for cough or congestion May use Albuterol Neb Twice daily and As needed    Follow-up with Dr. Elsworth Soho or Caedence Snowden NP in 3 -4 weeks  and As needed   Please contact office for sooner follow up if symptoms do not improve or worsen or seek emergency care   I will call with lab results.

## 2019-09-14 NOTE — Assessment & Plan Note (Signed)
Continue follow-up with oncology.  Recent PET scan showed diffuse lymphadenopathy/hypermetabolic.  Patient is on serial follow-up with oncology

## 2019-09-15 ENCOUNTER — Telehealth: Payer: Self-pay | Admitting: Adult Health

## 2019-09-15 NOTE — Progress Notes (Signed)
LMTCB

## 2019-09-15 NOTE — Telephone Encounter (Signed)
Advised pt of results. Pt understood and nothing further is needed.     Parrett, Fonnie Mu, NP  09/15/2019 10:52 AM EST    Kidney fxn is stable  CHF marker is just up slightly , anemia is stable .  Cont w/ ov recs Please contact office for sooner follow up if symptoms do not improve or worsen or seek emergency care     Let her know I am still working on the lung nodule on CT and will call her soon.

## 2019-09-27 ENCOUNTER — Encounter: Payer: Self-pay | Admitting: Pulmonary Disease

## 2019-09-27 ENCOUNTER — Other Ambulatory Visit: Payer: Self-pay

## 2019-09-27 ENCOUNTER — Ambulatory Visit (INDEPENDENT_AMBULATORY_CARE_PROVIDER_SITE_OTHER): Payer: Medicare Other | Admitting: Pulmonary Disease

## 2019-09-27 DIAGNOSIS — R0600 Dyspnea, unspecified: Secondary | ICD-10-CM | POA: Diagnosis not present

## 2019-09-27 DIAGNOSIS — J4520 Mild intermittent asthma, uncomplicated: Secondary | ICD-10-CM | POA: Diagnosis not present

## 2019-09-27 DIAGNOSIS — N1831 Chronic kidney disease, stage 3a: Secondary | ICD-10-CM

## 2019-09-27 DIAGNOSIS — R0602 Shortness of breath: Secondary | ICD-10-CM

## 2019-09-27 DIAGNOSIS — R911 Solitary pulmonary nodule: Secondary | ICD-10-CM | POA: Diagnosis not present

## 2019-09-27 DIAGNOSIS — R0609 Other forms of dyspnea: Secondary | ICD-10-CM

## 2019-09-27 DIAGNOSIS — G4733 Obstructive sleep apnea (adult) (pediatric): Secondary | ICD-10-CM | POA: Diagnosis not present

## 2019-09-27 NOTE — Progress Notes (Signed)
   Subjective:    Patient ID: Natasha Ellis, female    DOB: Jun 11, 1931, 86 y.o.   MRN: [id number]  HPI  86 yo smoker for FU  Of recurrent pneumonia, chronic cough &OSA -onCPAP 8 cm CHronic coughsince 2005  PMH - Low grade B cell diagnosed 2004. 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 Treated with multiple systemic therapies, Last 12/2014   She has a 25 pack-year smoking history -Quit in 1981 Was using only nocutrnal oxygen--this was discontinued in sept 2013   Underwent sinus surgery 12/2015 [name])  Chief Complaint  Patient presents with  . Follow-up    Patient is here for shortness of breath with exertion. States she thinks it has got worse since last OV on 2/10. Patient stuggles with daily activies putting on shoes, getting dressed.    Increased dyspnea, reviewed office visit 2/10-given short course of prednisone and Lasix However she did not take either, she is concerned about her kidneys with the Lasix and prednisone made her wired after 1 dose so she did not continue. O2 satn goes from 93 to lowest 89%  She is concerned that PET scan "did not show suspicious nodules "" left lower lobe 1.7 nodule noted earlier, I personally reviewed all her scans  Hospitalization 01/2019 reviewed for A. fib/RVR, echo prior 12/2018 showed normal LV function    Significant tests/ events reviewed PET scan March 27, 2019 extensive lymphadenopathy in the neck chest abdomen pelvis, hypermetabolism, no suspicious pulmonary nodules  CT angiogram 01/2019 showed interval increase in left lower lobe nodule up to 1.7 cm compared to CT from 08/2018 where this was 12 mm.  On CT scan 01/2016, this was 7 mm  08/2015 CT sinus >>Extensive multifocal paranasal sinus disease, most marked in the ethmoid air cell complexes and in the left maxillary antrum    10/2015 ONO shows minimal desaturations; does not need  O2.  01/2016 pansensitive pseudomonas in sputum  PFTs 12/2012 No airway obstruction ratio 74, FEV1 84%, DLCO 56% spirometry 5/2018does not show any airway obstruction, mild restriction  Review of Systems neg for any significant sore throat, dysphagia, itching, sneezing, nasal congestion or excess/ purulent secretions, fever, chills, sweats, unintended wt loss, pleuritic or exertional cp, hempoptysis, orthopnea pnd or change in chronic leg swelling. Also denies presyncope, palpitations, heartburn, abdominal pain, nausea, vomiting, diarrhea or change in bowel or urinary habits, dysuria,hematuria, rash, arthralgias, visual complaints, headache, numbness weakness or ataxia.     Objective:   Physical Exam   Gen. Pleasant, obese, in no distress, anxious affect ENT - no lesions, no post nasal drip Neck: No JVD, no thyromegaly, no carotid bruits Lungs: no use of accessory muscles, no dullness to percussion, decreased without rales or rhonchi  Cardiovascular: Rhythm regular, heart sounds  normal, no murmurs or gallops, 1+ peripheral edema Musculoskeletal: No deformities, no cyanosis or clubbing , no tremors        Assessment & Plan:

## 2019-09-27 NOTE — Assessment & Plan Note (Signed)
Dyspnea seems to have worsened significantly, while this may all be related to deconditioning.  She does have some pedal edema.  Echo reviewed which shows normal LV function and BNP was normal-so doubt overt heart failure. Could her lymphoma be getting worse, no evidence of worsening anemia on blood work  We will trial increased dose of Lasix 40 mg for 7 days and aim for 4 to 5 pound weight loss and see if she breathes better.  We will bring her back in 7 days for BME T check Doubt that this is a primary pulmonary issue She is afraid to start physical therapy but we may have to do this now that she is vaccinated

## 2019-09-27 NOTE — Patient Instructions (Signed)
CT chest without contrast.-Schedule at [facility] imaging  Increase Lasix 40 mg daily for 7 days Check bmet at our office on 3/2 for kidney function -let us know if breathing is improved by then  You may need to start  physical therapy at [facility]

## 2019-09-27 NOTE — Assessment & Plan Note (Signed)
Well controlled on CPAP. 

## 2019-09-27 NOTE — Assessment & Plan Note (Signed)
BMET in 1 week

## 2019-09-27 NOTE — Assessment & Plan Note (Signed)
Continue albuterol as needed, this does not seem to be the main issue, no bronchospasm at present

## 2019-09-27 NOTE — Addendum Note (Signed)
Addended by: Lia Foyer R on: 09/27/2019 01:21 PM   Modules accepted: Orders

## 2019-09-27 NOTE — Assessment & Plan Note (Signed)
Not hypermetabolic on PET -PET scan suggested that nodules were not present however this was at the left lower lobe adjacent to the liver and probably not seen due to motion artifact. We will repeat CT chest without contrast to follow-up

## 2019-09-28 ENCOUNTER — Telehealth: Payer: Self-pay | Admitting: Pulmonary Disease

## 2019-09-28 DIAGNOSIS — N1831 Chronic kidney disease, stage 3a: Secondary | ICD-10-CM

## 2019-09-28 NOTE — Telephone Encounter (Signed)
I would recommend the patient take 40 mg twice daily for the next 3 days, space out by around 6 hours  Then resume 40mg  daily.   Continue follow-up with our office with lab work as outlined by Dr. Elsworth Soho.  Patient also should follow-up with cardiology Dr. Radford Pax regarding her recent diuretic increase.  Please also route this message to Dr. Elsworth Soho as Juluis Rainier.  Wyn Quaker, FNP

## 2019-09-28 NOTE — Telephone Encounter (Signed)
Dr. Elsworth Soho,  I called the patient and advised her of the response received below. She stated she wanted a response directly from you regarding the information below.   Please review and advise of your response. I told her you will be working at the hospital tomorrow (09/29/19) but we will contact her with your response. Thank you.

## 2019-09-28 NOTE — Telephone Encounter (Signed)
Called and spoke with patient. Patient stated she saw dr Elsworth Soho 09/27/19 and he stated her sob was due to edema.  Patient told she was on Lasix 20mg  daily, and Dr. Elsworth Soho said to increase to 40mg  x's 7 days, then he would draw labs for improvement. Patient stated she noticed this morning she takes 40mg  daily.  This morning she took 80mg  lasix. Patient is wanting to know if she should continue 80mg  a day?  OV with Dr. Elsworth Soho 09/27/19-  Instructions    Return in about 2 months (around 11/25/2019) for TP/ me. CT chest without contrast.-Schedule at [facility] imaging  Increase Lasix 40 mg daily for 7 days Check bmet at our office on 3/2 for kidney function -let us know if breathing is improved by then  You may need to start  physical therapy at [facility] routed to [employer]

## 2019-09-29 NOTE — Telephone Encounter (Signed)
Called and spoke with pt letting her know the info stated by RA to increase 40mg  lasix to bid. Pt verbalized understanding. Stated to pt to come to office 3/2 for labwork and she verbalized understanding. Order placed for bmet. Nothing further needed.

## 2019-09-29 NOTE — Telephone Encounter (Signed)
Please ask her to increase Lasix 40 twice daily -first dose at 8 AM and second dose around 3 PM okay -Bmet at our office on 3/2 -when she can go back to once daily dosing

## 2019-10-02 ENCOUNTER — Other Ambulatory Visit: Payer: Self-pay

## 2019-10-02 ENCOUNTER — Ambulatory Visit
Admission: RE | Admit: 2019-10-02 | Discharge: 2019-10-02 | Disposition: A | Discharge status: Home / Self Care | Payer: Medicare Other | Source: Ambulatory Visit | Attending: Pulmonary Disease | Admitting: Pulmonary Disease

## 2019-10-02 DIAGNOSIS — J9809 Other diseases of bronchus, not elsewhere classified: Secondary | ICD-10-CM | POA: Diagnosis not present

## 2019-10-02 DIAGNOSIS — R0602 Shortness of breath: Secondary | ICD-10-CM

## 2019-10-03 ENCOUNTER — Other Ambulatory Visit (INDEPENDENT_AMBULATORY_CARE_PROVIDER_SITE_OTHER): Payer: Medicare Other

## 2019-10-03 ENCOUNTER — Telehealth: Payer: Self-pay | Admitting: Pulmonary Disease

## 2019-10-03 DIAGNOSIS — N1831 Chronic kidney disease, stage 3a: Secondary | ICD-10-CM

## 2019-10-03 LAB — BASIC METABOLIC PANEL
BUN: 20 mg/dL (ref 6–23)
CO2: 31 mEq/L (ref 19–32)
Calcium: 10.2 mg/dL (ref 8.4–10.5)
Chloride: 99 mEq/L (ref 96–112)
Creatinine, Ser: 1.43 mg/dL — ABNORMAL HIGH (ref 0.40–1.20)
GFR: 34.75 mL/min — ABNORMAL LOW (ref >60.00)
Glucose, Bld: 82 mg/dL (ref 70–99)
Potassium: 3.8 mEq/L (ref 3.5–5.1)
Sodium: 139 mEq/L (ref 135–145)

## 2019-10-03 NOTE — Telephone Encounter (Signed)
  Advised pt of results. Pt understood and nothing further is needed.  She would like to let RA know, she is 50 percent better while doubling the Lasix.      Rigoberto Noel, MD  10/02/2019 6:31 PM EST    Nodule is still there - about same size as before.I will not put her through a biopsy based on our discussion during OV. No other cause of sob apparent on this CT

## 2019-10-03 NOTE — Telephone Encounter (Signed)
Great. We will look at blood work and decide if Lasix needs to be continued at higher dose

## 2019-10-03 NOTE — Telephone Encounter (Signed)
Called and spoke with Patient.  Dr. Bari Mantis message given.  CT results and recommendations reviewed.  Patient stated she is having labs this afternoon.  Nothing further at this time.

## 2019-10-04 ENCOUNTER — Telehealth: Payer: Self-pay | Admitting: Pulmonary Disease

## 2019-10-04 NOTE — Telephone Encounter (Signed)
Called and spoke to pt. She was calling back for lab results. Per Dr. Elsworth Soho:  Potassium ok If she is feeling better, she can go back to her once daily dose of lasix  She verbalized understanding and states she will go back to the daily dose of lasix and will call back if things worsen. Nothing further needed at this time.

## 2019-10-09 DIAGNOSIS — N1832 Chronic kidney disease, stage 3b: Secondary | ICD-10-CM | POA: Diagnosis not present

## 2019-10-09 DIAGNOSIS — I5189 Other ill-defined heart diseases: Secondary | ICD-10-CM | POA: Diagnosis not present

## 2019-10-09 DIAGNOSIS — J479 Bronchiectasis, uncomplicated: Secondary | ICD-10-CM | POA: Diagnosis not present

## 2019-10-16 ENCOUNTER — Inpatient Hospital Stay (HOSPITAL_BASED_OUTPATIENT_CLINIC_OR_DEPARTMENT_OTHER): Payer: Medicare Other | Admitting: Oncology

## 2019-10-16 ENCOUNTER — Inpatient Hospital Stay: Payer: Medicare Other | Attending: Oncology

## 2019-10-16 ENCOUNTER — Other Ambulatory Visit: Payer: Self-pay

## 2019-10-16 VITALS — BP 162/60 | HR 66 | Temp 97.8°F | Resp 19 | Ht 66.0 in | Wt 281.5 lb

## 2019-10-16 DIAGNOSIS — C880 Waldenstrom macroglobulinemia: Secondary | ICD-10-CM

## 2019-10-16 DIAGNOSIS — G4730 Sleep apnea, unspecified: Secondary | ICD-10-CM | POA: Insufficient documentation

## 2019-10-16 DIAGNOSIS — N289 Disorder of kidney and ureter, unspecified: Secondary | ICD-10-CM | POA: Diagnosis not present

## 2019-10-16 DIAGNOSIS — D649 Anemia, unspecified: Secondary | ICD-10-CM | POA: Insufficient documentation

## 2019-10-16 DIAGNOSIS — Z8572 Personal history of non-Hodgkin lymphomas: Secondary | ICD-10-CM | POA: Insufficient documentation

## 2019-10-16 DIAGNOSIS — Z9221 Personal history of antineoplastic chemotherapy: Secondary | ICD-10-CM | POA: Insufficient documentation

## 2019-10-16 LAB — CBC WITH DIFFERENTIAL (CANCER CENTER ONLY)
Abs Immature Granulocytes: 0.02 10*3/uL (ref 0.00–0.07)
Basophils Absolute: 0 10*3/uL (ref 0.0–0.1)
Basophils Relative: 1 %
Eosinophils Absolute: 0.2 10*3/uL (ref 0.0–0.5)
Eosinophils Relative: 3 %
HCT: 32.7 % — ABNORMAL LOW (ref 36.0–46.0)
Hemoglobin: 10.8 g/dL — ABNORMAL LOW (ref 12.0–15.0)
Immature Granulocytes: 0 %
Lymphocytes Relative: 29 %
Lymphs Abs: 1.9 10*3/uL (ref 0.7–4.0)
MCH: 29.8 pg (ref 26.0–34.0)
MCHC: 33 g/dL (ref 30.0–36.0)
MCV: 90.3 fL (ref 80.0–100.0)
Monocytes Absolute: 0.4 10*3/uL (ref 0.1–1.0)
Monocytes Relative: 6 %
Neutro Abs: 3.9 10*3/uL (ref 1.7–7.7)
Neutrophils Relative %: 61 %
Platelet Count: 513 10*3/uL — ABNORMAL HIGH (ref 150–400)
RBC: 3.62 MIL/uL — ABNORMAL LOW (ref 3.87–5.11)
RDW: 16.6 % — ABNORMAL HIGH (ref 11.5–15.5)
WBC Count: 6.3 10*3/uL (ref 4.0–10.5)
nRBC: 0 % (ref 0.0–0.2)

## 2019-10-16 LAB — CMP (CANCER CENTER ONLY)
ALT: 9 U/L (ref 0–44)
AST: 14 U/L — ABNORMAL LOW (ref 15–41)
Albumin: 3.6 g/dL (ref 3.5–5.0)
Alkaline Phosphatase: 80 U/L (ref 38–126)
Anion gap: 11 (ref 5–15)
BUN: 18 mg/dL (ref 8–23)
CO2: 28 mmol/L (ref 22–32)
Calcium: 9.9 mg/dL (ref 8.9–10.3)
Chloride: 104 mmol/L (ref 98–111)
Creatinine: 1.46 mg/dL — ABNORMAL HIGH (ref 0.44–1.00)
GFR, Est AFR Am: 37 mL/min — ABNORMAL LOW (ref >60)
GFR, Estimated: 32 mL/min — ABNORMAL LOW (ref >60)
Glucose, Bld: 109 mg/dL — ABNORMAL HIGH (ref 70–99)
Potassium: 3.9 mmol/L (ref 3.5–5.1)
Sodium: 143 mmol/L (ref 135–145)
Total Bilirubin: 0.7 mg/dL (ref 0.3–1.2)
Total Protein: 8.7 g/dL — ABNORMAL HIGH (ref 6.5–8.1)

## 2019-10-16 LAB — LACTATE DEHYDROGENASE: LDH: 185 U/L (ref 98–192)

## 2019-10-16 NOTE — Progress Notes (Signed)
[place] OFFICE PROGRESS NOTE   Diagnosis: Non-Hodgkin's lymphoma  INTERVAL HISTORY:   Natasha Ellis returns as scheduled.  Good appetite.  No fever or night sweats.  She reports increased exertional dyspnea.  She is followed by Dr. Elsworth Soho for dyspnea and a lung nodule.  She has received the COVID-19 vaccine. No bleeding. Objective:  Vital signs in last 24 hours:  Blood pressure (!) 162/60, pulse 66, temperature 97.8 F (36.6 C), temperature source Temporal, resp. rate 19, height 5\' 6"  (1.676 m), weight 281 lb 8 oz (127.7 kg), SpO2 100 %.    Lymphatics: No cervical, supraclavicular, axillary, or inguinal nodes Resp: Lungs clear bilaterally Cardio: Regular rate and rhythm GI: No hepatosplenomegaly Vascular: Trace lower leg edema bilaterally with support stockings in place   Lab Results:  Lab Results  Component Value Date   WBC 6.3 10/16/2019   HGB 10.8 (L) 10/16/2019   HCT 32.7 (L) 10/16/2019   MCV 90.3 10/16/2019   PLT 513 (H) 10/16/2019   NEUTROABS 3.9 10/16/2019    CMP  Lab Results  Component Value Date   NA 143 10/16/2019   K 3.9 10/16/2019   CL 104 10/16/2019   CO2 28 10/16/2019   GLUCOSE 109 (H) 10/16/2019   BUN 18 10/16/2019   CREATININE 1.46 (H) 10/16/2019   CALCIUM 9.9 10/16/2019   PROT 8.7 (H) 10/16/2019   ALBUMIN 3.6 10/16/2019   AST 14 (L) 10/16/2019   ALT 9 10/16/2019   ALKPHOS 80 10/16/2019   BILITOT 0.7 10/16/2019   GFRNONAA 32 (L) 10/16/2019   GFRAA 37 (L) 10/16/2019     Medications: I have reviewed the patient's current medications.   Assessment/Plan: 1. 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 Treated with multiple systemic therapies includingpentostatin/Cytoxan/rituximab in 2010.   Cycle 1 bendamustine/Rituxan 05/31/2014.  Cycle 2  bendamustine/Rituxan 07/02/2014  Cycle 3 bendamustine/rituximab 07/30/2014  Cycle 4 bendamustine/rituximab 09/10/2014  Cycle 5 bendamustine/rituximab 10/22/2014  Cycle 6 bendamustine/rituximab 12/10/2014  PET scan 03/27/2019- diffuse small hypermetabolic lymph nodes in the neck, chest, abdomen, and pelvis, heterogenous hypermetabolism in the bones 2. Left knee arthritis. Followed by Dr. Wynelle Link. 3. Sleep apnea. 4. Anemia. microcytic with peripheral blood smear findings consistent with iron deficiency 07/09/2015, serum iron studies consistent with "anemia of chronic disease ", stool Hemoccult cards negative 07/11/2015 5. Question left parotitis 12/12/2013 presenting with left facial and parotid edema. 6. Admission with pneumonia February 2016 7. History of Neutropenia secondary to chemotherapy, now with moderate neutropenia 8. Left lung pneumonia 04/02/2015, sputum culture positive for Moraxella 06/19/2015 9. Low IgG and IgA 10. CT chest 05/13/2015 with bilateral lower lung consolidation 11. CT chest, abdomen, and pelvis 01/08/2016-new peribronchial thickening and peripheral tree in bud pattern at the right lower lobe, no lymphadenopathy 12. Sinus mucosal thickening/opacification on CT 08/16/2015-status post sinus surgery 12/05/2015 13. Sputum culture positive for pseudomonas aeruginosa on 01/10/2016-treated with 10 days of ciprofloxacin 14. 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 Admission with pneumonia 08/26/2016 16.  Left lung nodule increased in size on a CT 01/06/2019, not hypermetabolic on the PET scan 0000000, followed by Dr. Eartha Ellis     Disposition: [name] has lymphoplasmacytic lymphoma.  I suspect her dyspnea is unrelated to the lymphoma diagnosis.  She has mild anemia.  The anemia is likely secondary to lymphoma and chronic  renal insufficiency.  We will follow up on the [name] level from today.  [name] will return for an office and lab visit in 4  months.  She will call in the interim as needed.  [name], MD  10/16/2019  12:48 PM

## 2019-10-17 LAB — IGM: IgM (Immunoglobulin M), Srm: 3858 mg/dL — ABNORMAL HIGH (ref 26–217)

## 2019-10-18 ENCOUNTER — Telehealth: Payer: Self-pay

## 2019-10-18 ENCOUNTER — Telehealth: Payer: Self-pay | Admitting: Oncology

## 2019-10-18 LAB — PROTEIN ELECTROPHORESIS, SERUM
A/G Ratio: 0.8 (ref 0.7–1.7)
Albumin ELP: 3.6 g/dL (ref 2.9–4.4)
Alpha-1-Globulin: 0.2 g/dL (ref 0.0–0.4)
Alpha-2-Globulin: 0.8 g/dL (ref 0.4–1.0)
Beta Globulin: 0.8 g/dL (ref 0.7–1.3)
Gamma Globulin: 2.8 g/dL — ABNORMAL HIGH (ref 0.4–1.8)
Globulin, Total: 4.6 g/dL — ABNORMAL HIGH (ref 2.2–3.9)
M-Spike, %: 2.4 g/dL — ABNORMAL HIGH
Total Protein ELP: 8.2 g/dL (ref 6.0–8.5)

## 2019-10-18 NOTE — Telephone Encounter (Signed)
Spoke with patient regarding her recent lab work. Pt verbalizes understanding and appreciates call. Pt verbalizes understanding that scheduling team will reach out to her.

## 2019-10-18 NOTE — Telephone Encounter (Signed)
Scheduled appt per 3/17 sch message - pt aware of appt date and time

## 2019-10-18 NOTE — Telephone Encounter (Signed)
Scheduled per los. Called and left msg. Mailed printout  °

## 2019-10-18 NOTE — Telephone Encounter (Signed)
-----   Message from Ladell Pier, MD sent at 10/18/2019  2:05 PM EDT ----- Please call patient, [name] is higher, repeat [name], cbc, cmet, serum viscosity in 3-4 weeks, if higher will get CTs to look for evidence of progressive lymphoma,may need treatment again

## 2019-11-07 ENCOUNTER — Other Ambulatory Visit: Payer: Self-pay | Admitting: *Deleted

## 2019-11-07 DIAGNOSIS — C880 Waldenstrom macroglobulinemia: Secondary | ICD-10-CM

## 2019-11-08 ENCOUNTER — Other Ambulatory Visit: Payer: Self-pay

## 2019-11-08 ENCOUNTER — Inpatient Hospital Stay: Payer: Medicare Other | Attending: Oncology

## 2019-11-08 DIAGNOSIS — R911 Solitary pulmonary nodule: Secondary | ICD-10-CM | POA: Insufficient documentation

## 2019-11-08 DIAGNOSIS — M179 Osteoarthritis of knee, unspecified: Secondary | ICD-10-CM | POA: Diagnosis not present

## 2019-11-08 DIAGNOSIS — R109 Unspecified abdominal pain: Secondary | ICD-10-CM | POA: Diagnosis not present

## 2019-11-08 DIAGNOSIS — Z8673 Personal history of transient ischemic attack (TIA), and cerebral infarction without residual deficits: Secondary | ICD-10-CM | POA: Insufficient documentation

## 2019-11-08 DIAGNOSIS — M1712 Unilateral primary osteoarthritis, left knee: Secondary | ICD-10-CM | POA: Diagnosis not present

## 2019-11-08 DIAGNOSIS — G4730 Sleep apnea, unspecified: Secondary | ICD-10-CM | POA: Diagnosis not present

## 2019-11-08 DIAGNOSIS — C880 Waldenstrom macroglobulinemia: Secondary | ICD-10-CM | POA: Diagnosis not present

## 2019-11-08 DIAGNOSIS — Z5112 Encounter for antineoplastic immunotherapy: Secondary | ICD-10-CM | POA: Diagnosis not present

## 2019-11-08 DIAGNOSIS — Z5111 Encounter for antineoplastic chemotherapy: Secondary | ICD-10-CM | POA: Diagnosis not present

## 2019-11-08 DIAGNOSIS — Z8572 Personal history of non-Hodgkin lymphomas: Secondary | ICD-10-CM | POA: Insufficient documentation

## 2019-11-08 DIAGNOSIS — M25562 Pain in left knee: Secondary | ICD-10-CM | POA: Diagnosis not present

## 2019-11-08 LAB — CMP (CANCER CENTER ONLY)
ALT: 10 U/L (ref 0–44)
AST: 12 U/L — ABNORMAL LOW (ref 15–41)
Albumin: 3.6 g/dL (ref 3.5–5.0)
Alkaline Phosphatase: 79 U/L (ref 38–126)
Anion gap: 11 (ref 5–15)
BUN: 17 mg/dL (ref 8–23)
CO2: 29 mmol/L (ref 22–32)
Calcium: 10.1 mg/dL (ref 8.9–10.3)
Chloride: 105 mmol/L (ref 98–111)
Creatinine: 1.54 mg/dL — ABNORMAL HIGH (ref 0.44–1.00)
GFR, Est AFR Am: 35 mL/min — ABNORMAL LOW (ref >60)
GFR, Estimated: 30 mL/min — ABNORMAL LOW (ref >60)
Glucose, Bld: 89 mg/dL (ref 70–99)
Potassium: 4.3 mmol/L (ref 3.5–5.1)
Sodium: 145 mmol/L (ref 135–145)
Total Bilirubin: 0.9 mg/dL (ref 0.3–1.2)
Total Protein: 9.3 g/dL — ABNORMAL HIGH (ref 6.5–8.1)

## 2019-11-08 LAB — CBC WITH DIFFERENTIAL (CANCER CENTER ONLY)
Abs Immature Granulocytes: 0.01 10*3/uL (ref 0.00–0.07)
Basophils Absolute: 0 10*3/uL (ref 0.0–0.1)
Basophils Relative: 1 %
Eosinophils Absolute: 0.2 10*3/uL (ref 0.0–0.5)
Eosinophils Relative: 3 %
HCT: 35.1 % — ABNORMAL LOW (ref 36.0–46.0)
Hemoglobin: 11.2 g/dL — ABNORMAL LOW (ref 12.0–15.0)
Immature Granulocytes: 0 %
Lymphocytes Relative: 28 %
Lymphs Abs: 1.7 10*3/uL (ref 0.7–4.0)
MCH: 29.2 pg (ref 26.0–34.0)
MCHC: 31.9 g/dL (ref 30.0–36.0)
MCV: 91.4 fL (ref 80.0–100.0)
Monocytes Absolute: 0.4 10*3/uL (ref 0.1–1.0)
Monocytes Relative: 7 %
Neutro Abs: 3.8 10*3/uL (ref 1.7–7.7)
Neutrophils Relative %: 61 %
Platelet Count: 411 10*3/uL — ABNORMAL HIGH (ref 150–400)
RBC: 3.84 MIL/uL — ABNORMAL LOW (ref 3.87–5.11)
RDW: 16.4 % — ABNORMAL HIGH (ref 11.5–15.5)
WBC Count: 6.2 10*3/uL (ref 4.0–10.5)
nRBC: 0 % (ref 0.0–0.2)

## 2019-11-09 ENCOUNTER — Telehealth: Payer: Self-pay | Admitting: *Deleted

## 2019-11-09 LAB — IGM: IgM (Immunoglobulin M), Srm: 4231 mg/dL — ABNORMAL HIGH (ref 26–217)

## 2019-11-09 NOTE — Telephone Encounter (Signed)
Called w/results of [name] and probable need for tx in near future. She agrees to see MD on 4/13 when her daughter can be present. She does not like video visits.

## 2019-11-09 NOTE — Telephone Encounter (Signed)
-----   Message from Ladell Pier, MD sent at 11/09/2019  2:08 PM EDT ----- Please call patient, Natasha Ellis is higher, will likely need treatment for the waldenstroms soon, scheduled office or video next week to discuss

## 2019-11-13 LAB — VISCOSITY, SERUM: Viscosity, Serum: 2 rel.saline (ref 1.4–2.1)

## 2019-11-14 ENCOUNTER — Telehealth: Payer: Self-pay | Admitting: Oncology

## 2019-11-14 ENCOUNTER — Other Ambulatory Visit: Payer: Self-pay

## 2019-11-14 ENCOUNTER — Other Ambulatory Visit: Payer: Self-pay | Admitting: *Deleted

## 2019-11-14 ENCOUNTER — Inpatient Hospital Stay (HOSPITAL_BASED_OUTPATIENT_CLINIC_OR_DEPARTMENT_OTHER): Payer: Medicare Other | Admitting: Oncology

## 2019-11-14 VITALS — BP 144/58 | HR 74 | Temp 98.0°F | Resp 19 | Ht 66.0 in | Wt 278.9 lb

## 2019-11-14 DIAGNOSIS — C880 Waldenstrom macroglobulinemia: Secondary | ICD-10-CM | POA: Diagnosis not present

## 2019-11-14 DIAGNOSIS — Z7189 Other specified counseling: Secondary | ICD-10-CM | POA: Diagnosis not present

## 2019-11-14 DIAGNOSIS — Z8572 Personal history of non-Hodgkin lymphomas: Secondary | ICD-10-CM | POA: Diagnosis not present

## 2019-11-14 DIAGNOSIS — R109 Unspecified abdominal pain: Secondary | ICD-10-CM | POA: Diagnosis not present

## 2019-11-14 DIAGNOSIS — Z5111 Encounter for antineoplastic chemotherapy: Secondary | ICD-10-CM | POA: Diagnosis not present

## 2019-11-14 DIAGNOSIS — G4730 Sleep apnea, unspecified: Secondary | ICD-10-CM | POA: Diagnosis not present

## 2019-11-14 DIAGNOSIS — Z5112 Encounter for antineoplastic immunotherapy: Secondary | ICD-10-CM | POA: Diagnosis not present

## 2019-11-14 NOTE — Progress Notes (Signed)
Md requesting add on tests from sample drawn on 11/08/19-Hepatitis B surface antigen and Hepatitis B core antibody total. Lab notified. Per lab: samples from 11/08/19 have already been discarded. Will draw on 11/27/19

## 2019-11-14 NOTE — Progress Notes (Signed)
Patient on plan of care prior to pathways. 

## 2019-11-14 NOTE — Telephone Encounter (Signed)
Scheduled appt per 4/13 los.  Moved appts out a week from the los, due to the pt needing her appts on Monday and Tuesday, and couldn't schedule them for 4/19 and 4/20, due to insurance and prior authorization.  Patient was okay with moving them out a week, as long as she can have them on Monday and Tuesday,  Informed the MD nurse.  Printed avs per pt request and she is aware of her scheduled appt dates and time

## 2019-11-14 NOTE — Progress Notes (Signed)
[place] OFFICE PROGRESS NOTE   Diagnosis: WaldenStrom's macroglobulinemia  INTERVAL HISTORY:   Natasha Ellis returns prior to a scheduled visit.  The [name] level has been significantly higher over the past month.  She complains of increased exertional dyspnea.  No fever or night sweats.  She has transient abdominal pain with position change.  Good appetite.  Objective:  Vital signs in last 24 hours:  Blood pressure (!) 144/58, pulse 74, temperature 98 F (36.7 C), temperature source Oral, resp. rate 19, height 5\' 6"  (1.676 m), weight 278 lb 14.4 oz (126.5 kg), SpO2 99 %.     Lymphatics: No cervical, supraclavicular, or axillary nodes Resp: Lungs clear bilaterally Cardio: Regular rate and rhythm GI: Nontender, no mass, no hepatosplenomegaly Vascular: No leg edema    Lab Results:  Lab Results  Component Value Date   WBC 6.2 11/08/2019   HGB 11.2 (L) 11/08/2019   HCT 35.1 (L) 11/08/2019   MCV 91.4 11/08/2019   PLT 411 (H) 11/08/2019   NEUTROABS 3.8 11/08/2019    CMP  Lab Results  Component Value Date   NA 145 11/08/2019   K 4.3 11/08/2019   CL 105 11/08/2019   CO2 29 11/08/2019   GLUCOSE 89 11/08/2019   BUN 17 11/08/2019   CREATININE 1.54 (H) 11/08/2019   CALCIUM 10.1 11/08/2019   PROT 9.3 (H) 11/08/2019   ALBUMIN 3.6 11/08/2019   AST 12 (L) 11/08/2019   ALT 10 11/08/2019   ALKPHOS 79 11/08/2019   BILITOT 0.9 11/08/2019   GFRNONAA 30 (L) 11/08/2019   GFRAA 35 (L) 11/08/2019    [name] on 11/08/2019: 4231 Medications: I have reviewed the patient's current medications.   Assessment/Plan: 1. 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 Treated with multiple systemic therapies includingpentostatin/Cytoxan/rituximab in 2010.   Cycle 1 bendamustine/Rituxan  05/31/2014.  Cycle 2 bendamustine/Rituxan 07/02/2014  Cycle 3 bendamustine/rituximab 07/30/2014  Cycle 4 bendamustine/rituximab 09/10/2014  Cycle 5 bendamustine/rituximab 10/22/2014  Cycle 6 bendamustine/rituximab 12/10/2014  PET scan 03/27/2019- diffuse small hypermetabolic lymph nodes in the neck, chest, abdomen, and pelvis, heterogenous hypermetabolism in the bones  CT chest 10/02/2019-bronchiectasis at the lung bases, 13 mm nodular area at the medial left hemidiaphragm, mildly enlarged mediastinal nodes-decreased from June 2020, slightly enlarged left axillary node-9 mm  Rising [name] level March and April 2021 2. Left knee arthritis. Followed by Dr. Wynelle Link. 3. Sleep apnea. 4. Anemia. microcytic with peripheral blood smear findings consistent with iron deficiency 07/09/2015, serum iron studies consistent with "anemia of chronic disease ", stool Hemoccult cards negative 07/11/2015 5. Question left parotitis 12/12/2013 presenting with left facial and parotid edema. 6. Admission with pneumonia February 2016 7. History of Neutropenia secondary to chemotherapy, now with moderate neutropenia 8. Left lung pneumonia 04/02/2015, sputum culture positive for Moraxella 06/19/2015 9. Low IgG and IgA 10. CT chest 05/13/2015 with bilateral lower lung consolidation 11. CT chest, abdomen, and pelvis 01/08/2016-new peribronchial thickening and peripheral tree in bud pattern at the right lower lobe, no lymphadenopathy 12. Sinus mucosal thickening/opacification on CT 08/16/2015-status post sinus surgery 12/05/2015 13. Sputum culture positive for pseudomonas aeruginosa on 01/10/2016-treated with 10 days of ciprofloxacin 14. 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 Admission with pneumonia 08/26/2016 16.  Left lung nodule increased in size on a CT 01/06/2019, not hypermetabolic on the PET scan  03/27/2019, followed by Dr. Eartha Inch     Disposition: Natasha Ellis has lymphoplasmacytic  lymphoma.  She has been followed off of systemic therapy since May 2016.  The [name] level is higher.  The serum viscosity was normal last week.  She does not appear to have bulky lymphadenopathy.  I discussed treatment options with Natasha Ellis and her daughter.  It is unclear whether the dyspnea is in part related to lymphoma or another etiology.  She has chronic pulmonary disease which may explain her dyspnea.  We discussed continued observation versus resuming systemic therapy.  I am concerned she could develop hyperviscosity symptoms if the [name] continues to rise.  She does not have symptoms of hyperviscosity syndrome at present.  We discussed bendamustine/rituximab therapy.  She has been treated with this regimen in the past.  We reviewed potential toxicities associated with bendamustine including the chance for nausea, rash, pneumonitis, and hematologic toxicity.  We discussed the allergic reaction, CNS toxicity, reactivation of hepatitis B, and atypical infections associated with rituximab.  Natasha Ellis would like to resume systemic therapy.  The plan is to begin treatment on 11/27/2019.  A chemotherapy plan was entered today.  [name], MD  11/14/2019  9:42 AM

## 2019-11-14 NOTE — Progress Notes (Signed)
START ON PATHWAY REGIMEN - Lymphoma and CLL     A cycle is every 28 days:     Bendamustine      Rituximab-xxxx   **Always confirm dose/schedule in your pharmacy ordering system**  Patient Characteristics: Waldenstrom Macroglobulinemia/Lymphoplasmacytic Lymphoma, Symptomatic, First Line Disease Type: Waldenstrom Macroglobulinemia/Lymphoplasmacytic Lymphoma Disease Type: Not Applicable Disease Type: Not Applicable Asymptomatic or Symptomatic<= Symptomatic Line of Therapy: First Line Intent of Therapy: Non-Curative / Palliative Intent, Discussed with Patient 

## 2019-11-15 DIAGNOSIS — M1712 Unilateral primary osteoarthritis, left knee: Secondary | ICD-10-CM | POA: Diagnosis not present

## 2019-11-21 NOTE — Progress Notes (Signed)
Pharmacist Chemotherapy Monitoring - Follow Up Assessment    I verify that I have reviewed each item in the below checklist:  . Regimen for the patient is scheduled for the appropriate day and plan matches scheduled date. Marland Kitchen Appropriate non-routine labs are ordered dependent on drug ordered. . If applicable, additional medications reviewed and ordered per protocol based on lifetime cumulative doses and/or treatment regimen.   Plan for follow-up and/or issues identified: No . I-vent associated with next due treatment: No . MD and/or nursing notified: No  Romualdo Bolk [facility] 11/21/2019 2:09 PM

## 2019-11-22 ENCOUNTER — Other Ambulatory Visit: Payer: Self-pay | Admitting: Oncology

## 2019-11-22 DIAGNOSIS — M1712 Unilateral primary osteoarthritis, left knee: Secondary | ICD-10-CM | POA: Diagnosis not present

## 2019-11-22 DIAGNOSIS — C880 Waldenstrom macroglobulinemia: Secondary | ICD-10-CM

## 2019-11-22 NOTE — Progress Notes (Signed)
Pharmacist Chemotherapy Monitoring - Follow Up Assessment    I verify that I have reviewed each item in the below checklist:  . Regimen for the patient is scheduled for the appropriate day and plan matches scheduled date. Marland Kitchen Appropriate non-routine labs are ordered dependent on drug ordered. . If applicable, additional medications reviewed and ordered per protocol based on lifetime cumulative doses and/or treatment regimen.   Plan for follow-up and/or issues identified: No . I-vent associated with next due treatment: No . MD and/or nursing notified: No  Natasha Ellis [facility] 11/22/2019 12:08 PM

## 2019-11-27 ENCOUNTER — Inpatient Hospital Stay: Payer: Medicare Other

## 2019-11-27 ENCOUNTER — Other Ambulatory Visit: Payer: Self-pay

## 2019-11-27 VITALS — BP 151/62 | HR 73 | Temp 98.2°F | Resp 18

## 2019-11-27 DIAGNOSIS — G4730 Sleep apnea, unspecified: Secondary | ICD-10-CM | POA: Diagnosis not present

## 2019-11-27 DIAGNOSIS — R109 Unspecified abdominal pain: Secondary | ICD-10-CM | POA: Diagnosis not present

## 2019-11-27 DIAGNOSIS — C880 Waldenstrom macroglobulinemia: Secondary | ICD-10-CM

## 2019-11-27 DIAGNOSIS — Z8572 Personal history of non-Hodgkin lymphomas: Secondary | ICD-10-CM | POA: Diagnosis not present

## 2019-11-27 DIAGNOSIS — Z5111 Encounter for antineoplastic chemotherapy: Secondary | ICD-10-CM | POA: Diagnosis not present

## 2019-11-27 DIAGNOSIS — Z5112 Encounter for antineoplastic immunotherapy: Secondary | ICD-10-CM | POA: Diagnosis not present

## 2019-11-27 LAB — CMP (CANCER CENTER ONLY)
ALT: 12 U/L (ref 0–44)
AST: 14 U/L — ABNORMAL LOW (ref 15–41)
Albumin: 3.3 g/dL — ABNORMAL LOW (ref 3.5–5.0)
Alkaline Phosphatase: 71 U/L (ref 38–126)
Anion gap: 13 (ref 5–15)
BUN: 19 mg/dL (ref 8–23)
CO2: 26 mmol/L (ref 22–32)
Calcium: 9.8 mg/dL (ref 8.9–10.3)
Chloride: 105 mmol/L (ref 98–111)
Creatinine: 1.48 mg/dL — ABNORMAL HIGH (ref 0.44–1.00)
GFR, Est AFR Am: 37 mL/min — ABNORMAL LOW (ref >60)
GFR, Estimated: 32 mL/min — ABNORMAL LOW (ref >60)
Glucose, Bld: 122 mg/dL — ABNORMAL HIGH (ref 70–99)
Potassium: 3.9 mmol/L (ref 3.5–5.1)
Sodium: 144 mmol/L (ref 135–145)
Total Bilirubin: 0.5 mg/dL (ref 0.3–1.2)
Total Protein: 9 g/dL — ABNORMAL HIGH (ref 6.5–8.1)

## 2019-11-27 LAB — HEPATITIS B CORE ANTIBODY, TOTAL: Hep B Core Total Ab: NONREACTIVE

## 2019-11-27 LAB — HEPATITIS B SURFACE ANTIGEN: Hepatitis B Surface Ag: NONREACTIVE

## 2019-11-27 MED ORDER — PALONOSETRON HCL INJECTION 0.25 MG/5ML
0.2500 mg | Freq: Once | INTRAVENOUS | Status: AC
  Administered 2019-11-27: 10:00:00 0.25 mg via INTRAVENOUS

## 2019-11-27 MED ORDER — DIPHENHYDRAMINE HCL 25 MG PO CAPS
50.0000 mg | ORAL_CAPSULE | Freq: Once | ORAL | Status: AC
  Administered 2019-11-27: 50 mg via ORAL

## 2019-11-27 MED ORDER — DIPHENHYDRAMINE HCL 25 MG PO CAPS
ORAL_CAPSULE | ORAL | Status: AC
  Filled 2019-11-27: qty 2

## 2019-11-27 MED ORDER — PALONOSETRON HCL INJECTION 0.25 MG/5ML
INTRAVENOUS | Status: AC
  Filled 2019-11-27: qty 5

## 2019-11-27 MED ORDER — SODIUM CHLORIDE 0.9 % IV SOLN
70.0000 mg/m2 | Freq: Once | INTRAVENOUS | Status: AC
  Administered 2019-11-27: 16:00:00 175 mg via INTRAVENOUS
  Filled 2019-11-27: qty 7

## 2019-11-27 MED ORDER — ACETAMINOPHEN 325 MG PO TABS
650.0000 mg | ORAL_TABLET | Freq: Once | ORAL | Status: AC
  Administered 2019-11-27: 10:00:00 650 mg via ORAL

## 2019-11-27 MED ORDER — SODIUM CHLORIDE 0.9 % IV SOLN
375.0000 mg/m2 | Freq: Once | INTRAVENOUS | Status: AC
  Administered 2019-11-27: 11:00:00 900 mg via INTRAVENOUS
  Filled 2019-11-27: qty 40

## 2019-11-27 MED ORDER — ACETAMINOPHEN 325 MG PO TABS
ORAL_TABLET | ORAL | Status: AC
  Filled 2019-11-27: qty 2

## 2019-11-27 MED ORDER — SODIUM CHLORIDE 0.9 % IV SOLN
Freq: Once | INTRAVENOUS | Status: AC
  Filled 2019-11-27: qty 250

## 2019-11-27 MED ORDER — SODIUM CHLORIDE 0.9 % IV SOLN
10.0000 mg | Freq: Once | INTRAVENOUS | Status: AC
  Administered 2019-11-27: 10:00:00 10 mg via INTRAVENOUS
  Filled 2019-11-27: qty 10

## 2019-11-27 NOTE — Progress Notes (Signed)
NO CBC today per MD Benay Spice, will use the CBC from 4/7 for treatment this week.

## 2019-11-27 NOTE — Progress Notes (Signed)
Rituxan and bendeka, first time. Tolerated without issues. Pt frequently OOB when ambulating to BR. 2L O2 provided as needed. NO complaints of reaction symptoms.

## 2019-11-27 NOTE — Patient Instructions (Addendum)
Rituximab injection What is this medicine? RITUXIMAB (ri TUX i mab) is a monoclonal antibody. It is used to treat certain types of cancer like non-Hodgkin lymphoma and chronic lymphocytic leukemia. It is also used to treat rheumatoid arthritis, granulomatosis with polyangiitis (or Wegener's granulomatosis), microscopic polyangiitis, and pemphigus vulgaris. This medicine may be used for other purposes; ask your health care provider or pharmacist if you have questions. COMMON BRAND NAME(S): Rituxan, RUXIENCE What should I tell my health care provider before I take this medicine? They need to know if you have any of these conditions:  heart disease  infection (especially a virus infection such as hepatitis B, chickenpox, cold sores, or herpes)  immune system problems  irregular heartbeat  kidney disease  low blood counts, like low white cell, platelet, or red cell counts  lung or breathing disease, like asthma  recently received or scheduled to receive a vaccine  an unusual or allergic reaction to rituximab, other medicines, foods, dyes, or preservatives  pregnant or trying to get pregnant  breast-feeding How should I use this medicine? This medicine is for infusion into a vein. It is administered in a hospital or clinic by a specially trained health care professional. A special MedGuide will be given to you by the pharmacist with each prescription and refill. Be sure to read this information carefully each time. Talk to your pediatrician regarding the use of this medicine in children. This medicine is not approved for use in children. Overdosage: If you think you have taken too much of this medicine contact a poison control center or emergency room at once. NOTE: This medicine is only for you. Do not share this medicine with others. What if I miss a dose? It is important not to miss a dose. Call your doctor or health care professional if you are unable to keep an appointment. What  may interact with this medicine?  cisplatin  live virus vaccines This list may not describe all possible interactions. Give your health care provider a list of all the medicines, herbs, non-prescription drugs, or dietary supplements you use. Also tell them if you smoke, drink alcohol, or use illegal drugs. Some items may interact with your medicine. What should I watch for while using this medicine? Your condition will be monitored carefully while you are receiving this medicine. You may need blood work done while you are taking this medicine. This medicine can cause serious allergic reactions. To reduce your risk you may need to take medicine before treatment with this medicine. Take your medicine as directed. In some patients, this medicine may cause a serious brain infection that may cause death. If you have any problems seeing, thinking, speaking, walking, or standing, tell your healthcare professional right away. If you cannot reach your healthcare professional, urgently seek other source of medical care. Call your doctor or health care professional for advice if you get a fever, chills or sore throat, or other symptoms of a cold or flu. Do not treat yourself. This drug decreases your body's ability to fight infections. Try to avoid being around people who are sick. Do not become pregnant while taking this medicine or for at least 12 months after stopping it. Women should inform their doctor if they wish to become pregnant or think they might be pregnant. There is a potential for serious side effects to an unborn child. Talk to your health care professional or pharmacist for more information. Do not breast-feed an infant while taking this medicine or for at   least 6 months after stopping it. What side effects may I notice from receiving this medicine? Side effects that you should report to your doctor or health care professional as soon as possible:  allergic reactions like skin rash, itching or  hives; swelling of the face, lips, or tongue  breathing problems  chest pain  changes in vision  diarrhea  headache with fever, neck stiffness, sensitivity to light, nausea, or confusion  fast, irregular heartbeat  loss of memory  low blood counts - this medicine may decrease the number of white blood cells, red blood cells and platelets. You may be at increased risk for infections and bleeding.  mouth sores  problems with balance, talking, or walking  redness, blistering, peeling or loosening of the skin, including inside the mouth  signs of infection - fever or chills, cough, sore throat, pain or difficulty passing urine  signs and symptoms of kidney injury like trouble passing urine or change in the amount of urine  signs and symptoms of liver injury like dark yellow or brown urine; general ill feeling or flu-like symptoms; light-colored stools; loss of appetite; nausea; right upper belly pain; unusually weak or tired; yellowing of the eyes or skin  signs and symptoms of low blood pressure like dizziness; feeling faint or lightheaded, falls; unusually weak or tired  stomach pain  swelling of the ankles, feet, hands  unusual bleeding or bruising  vomiting Side effects that usually do not require medical attention (report to your doctor or health care professional if they continue or are bothersome):  headache  joint pain  muscle cramps or muscle pain  nausea  tiredness This list may not describe all possible side effects. Call your doctor for medical advice about side effects. You may report side effects to FDA at 1-800-FDA-1088. Where should I keep my medicine? This drug is given in a hospital or clinic and will not be stored at home. NOTE: This sheet is a summary. It may not cover all possible information. If you have questions about this medicine, talk to your doctor, pharmacist, or health care provider.  2020 Elsevier/Gold Standard (2018-08-31  22:01:36) [facility] Discharge Instructions for Patients Receiving Chemotherapy  Today you received the following chemotherapy agents Rituxan, Bendeka.  To help prevent nausea and vomiting after your treatment, we encourage you to take your nausea medication Do not take Zofran for three days after treatment.   If you develop nausea and vomiting that is not controlled by your nausea medication, call the clinic.   BELOW ARE SYMPTOMS THAT SHOULD BE REPORTED IMMEDIATELY:  *FEVER GREATER THAN 100.5 F  *CHILLS WITH OR WITHOUT FEVER  NAUSEA AND VOMITING THAT IS NOT CONTROLLED WITH YOUR NAUSEA MEDICATION  *UNUSUAL SHORTNESS OF BREATH  *UNUSUAL BRUISING OR BLEEDING  TENDERNESS IN MOUTH AND THROAT WITH OR WITHOUT PRESENCE OF ULCERS  *URINARY PROBLEMS  *BOWEL PROBLEMS  UNUSUAL RASH Items with * indicate a potential emergency and should be followed up as soon as possible.  Feel free to call the clinic should you have any questions or concerns. The clinic phone number is [phone].  Please show the [place] at check-in to the Emergency Department and triage nurse.  Bendamustine Injection What is this medicine? BENDAMUSTINE (BEN da MUS teen) is a chemotherapy drug. It is used to treat chronic lymphocytic leukemia and non-Hodgkin lymphoma. This medicine may be used for other purposes; ask your health care provider or pharmacist if you have questions. COMMON BRAND NAME(S):  Kristine Royal, Treanda What should I tell my health care provider before I take this medicine? They need to know if you have any of these conditions:  infection (especially a virus infection such as chickenpox, cold sores, or herpes)  kidney disease  liver disease  an unusual or allergic reaction to bendamustine, mannitol, other medicines, foods, dyes, or preservatives  pregnant or trying to get pregnant  breast-feeding How should I use this medicine? This medicine is for  infusion into a vein. It is given by a health care professional in a hospital or clinic setting. Talk to your pediatrician regarding the use of this medicine in children. Special care may be needed. Overdosage: If you think you have taken too much of this medicine contact a poison control center or emergency room at once. NOTE: This medicine is only for you. Do not share this medicine with others. What if I miss a dose? It is important not to miss your dose. Call your doctor or health care professional if you are unable to keep an appointment. What may interact with this medicine? Do not take this medicine with any of the following medications:  clozapine This medicine may also interact with the following medications:  atazanavir  cimetidine  ciprofloxacin  enoxacin  fluvoxamine  medicines for seizures like carbamazepine and phenobarbital  mexiletine  rifampin  tacrine  thiabendazole  zileuton This list may not describe all possible interactions. Give your health care provider a list of all the medicines, herbs, non-prescription drugs, or dietary supplements you use. Also tell them if you smoke, drink alcohol, or use illegal drugs. Some items may interact with your medicine. What should I watch for while using this medicine? This drug may make you feel generally unwell. This is not uncommon, as chemotherapy can affect healthy cells as well as cancer cells. Report any side effects. Continue your course of treatment even though you feel ill unless your doctor tells you to stop. You may need blood work done while you are taking this medicine. Call your doctor or healthcare provider for advice if you get a fever, chills or sore throat, or other symptoms of a cold or flu. Do not treat yourself. This drug decreases your body's ability to fight infections. Try to avoid being around people who are sick. This medicine may cause serious skin reactions. They can happen weeks to months  after starting the medicine. Contact your healthcare provider right away if you notice fevers or flu-like symptoms with a rash. The rash may be red or purple and then turn into blisters or peeling of the skin. Or, you might notice a red rash with swelling of the face, lips or lymph nodes in your neck or under your arms. This medicine may increase your risk to bruise or bleed. Call your doctor or healthcare provider if you notice any unusual bleeding. Talk to your doctor about your risk of cancer. You may be more at risk for certain types of cancers if you take this medicine. Do not become pregnant while taking this medicine or for at least 6 months after stopping it. Women should inform their doctor if they wish to become pregnant or think they might be pregnant. Men should not father a child while taking this medicine and for at least 3 months after stopping it. There is a potential for serious side effects to an unborn child. Talk to your healthcare provider or pharmacist for more information. Do not breast-feed an infant while taking this medicine  or for at least 1 week after stopping it. This medicine may make it more difficult to father a child. You should talk with your doctor or healthcare provider if you are concerned about your fertility. What side effects may I notice from receiving this medicine? Side effects that you should report to your doctor or health care professional as soon as possible:  allergic reactions like skin rash, itching or hives, swelling of the face, lips, or tongue  low blood counts - this medicine may decrease the number of white blood cells, red blood cells and platelets. You may be at increased risk for infections and bleeding.  rash, fever, and swollen lymph nodes  redness, blistering, peeling, or loosening of the skin, including inside the mouth  signs of infection like fever or chills, cough, sore throat, pain or difficulty passing urine  signs of decreased  platelets or bleeding like bruising, pinpoint red spots on the skin, black, tarry stools, blood in the urine  signs of decreased red blood cells like being unusually weak or tired, fainting spells, lightheadedness  signs and symptoms of kidney injury like trouble passing urine or change in the amount of urine  signs and symptoms of liver injury like dark yellow or brown urine; general ill feeling or flu-like symptoms; light-colored stools; loss of appetite; nausea; right upper belly pain; unusually weak or tired; yellowing of the eyes or skin Side effects that usually do not require medical attention (report to your doctor or health care professional if they continue or are bothersome):  constipation  decreased appetite  diarrhea  headache  mouth sores  nausea, vomiting  tiredness This list may not describe all possible side effects. Call your doctor for medical advice about side effects. You may report side effects to FDA at 1-800-FDA-1088. Where should I keep my medicine? This drug is given in a hospital or clinic and will not be stored at home. NOTE: This sheet is a summary. It may not cover all possible information. If you have questions about this medicine, talk to your doctor, pharmacist, or health care provider.  2020 Elsevier/Gold Standard (2018-10-11 10:26:46)

## 2019-11-28 ENCOUNTER — Other Ambulatory Visit: Payer: Self-pay

## 2019-11-28 ENCOUNTER — Encounter: Payer: Self-pay | Admitting: Pharmacist

## 2019-11-28 ENCOUNTER — Inpatient Hospital Stay: Payer: Medicare Other

## 2019-11-28 VITALS — BP 137/71 | HR 77 | Temp 98.5°F | Resp 18

## 2019-11-28 DIAGNOSIS — Z5112 Encounter for antineoplastic immunotherapy: Secondary | ICD-10-CM | POA: Diagnosis not present

## 2019-11-28 DIAGNOSIS — C880 Waldenstrom macroglobulinemia: Secondary | ICD-10-CM

## 2019-11-28 DIAGNOSIS — R109 Unspecified abdominal pain: Secondary | ICD-10-CM | POA: Diagnosis not present

## 2019-11-28 DIAGNOSIS — Z8572 Personal history of non-Hodgkin lymphomas: Secondary | ICD-10-CM | POA: Diagnosis not present

## 2019-11-28 DIAGNOSIS — Z5111 Encounter for antineoplastic chemotherapy: Secondary | ICD-10-CM | POA: Diagnosis not present

## 2019-11-28 DIAGNOSIS — G4730 Sleep apnea, unspecified: Secondary | ICD-10-CM | POA: Diagnosis not present

## 2019-11-28 MED ORDER — SODIUM CHLORIDE 0.9 % IV SOLN
Freq: Once | INTRAVENOUS | Status: AC
  Filled 2019-11-28: qty 250

## 2019-11-28 MED ORDER — SODIUM CHLORIDE 0.9 % IV SOLN
70.0000 mg/m2 | Freq: Once | INTRAVENOUS | Status: AC
  Administered 2019-11-28: 10:00:00 175 mg via INTRAVENOUS
  Filled 2019-11-28: qty 7

## 2019-11-28 MED ORDER — SODIUM CHLORIDE 0.9 % IV SOLN
10.0000 mg | Freq: Once | INTRAVENOUS | Status: AC
  Administered 2019-11-28: 09:00:00 10 mg via INTRAVENOUS
  Filled 2019-11-28: qty 10

## 2019-11-28 NOTE — Patient Instructions (Signed)
[facility] Discharge Instructions for Patients Receiving Chemotherapy  Today you received the following chemotherapy agents: bendamustine.  To help prevent nausea and vomiting after your treatment, we encourage you to take your nausea medication as directed.   If you develop nausea and vomiting that is not controlled by your nausea medication, call the clinic.   BELOW ARE SYMPTOMS THAT SHOULD BE REPORTED IMMEDIATELY:  *FEVER GREATER THAN 100.5 F  *CHILLS WITH OR WITHOUT FEVER  NAUSEA AND VOMITING THAT IS NOT CONTROLLED WITH YOUR NAUSEA MEDICATION  *UNUSUAL SHORTNESS OF BREATH  *UNUSUAL BRUISING OR BLEEDING  TENDERNESS IN MOUTH AND THROAT WITH OR WITHOUT PRESENCE OF ULCERS  *URINARY PROBLEMS  *BOWEL PROBLEMS  UNUSUAL RASH Items with * indicate a potential emergency and should be followed up as soon as possible.  Feel free to call the clinic should you have any questions or concerns. The clinic phone number is [phone].  Please show the CHEMO ALERT CARD at check-in to the Emergency Department and triage nurse.   

## 2019-12-13 ENCOUNTER — Other Ambulatory Visit: Payer: Self-pay | Admitting: *Deleted

## 2019-12-13 DIAGNOSIS — C880 Waldenstrom macroglobulinemia: Secondary | ICD-10-CM

## 2019-12-14 ENCOUNTER — Inpatient Hospital Stay: Payer: Medicare Other | Attending: Oncology | Admitting: Oncology

## 2019-12-14 ENCOUNTER — Other Ambulatory Visit: Payer: Self-pay

## 2019-12-14 ENCOUNTER — Telehealth: Payer: Self-pay | Admitting: Oncology

## 2019-12-14 ENCOUNTER — Inpatient Hospital Stay: Payer: Medicare Other

## 2019-12-14 ENCOUNTER — Telehealth: Payer: Self-pay | Admitting: Pulmonary Disease

## 2019-12-14 VITALS — BP 124/53 | HR 87 | Temp 98.0°F | Resp 18 | Wt 278.9 lb

## 2019-12-14 DIAGNOSIS — Z8673 Personal history of transient ischemic attack (TIA), and cerebral infarction without residual deficits: Secondary | ICD-10-CM | POA: Diagnosis not present

## 2019-12-14 DIAGNOSIS — G4730 Sleep apnea, unspecified: Secondary | ICD-10-CM | POA: Diagnosis not present

## 2019-12-14 DIAGNOSIS — Z5112 Encounter for antineoplastic immunotherapy: Secondary | ICD-10-CM | POA: Diagnosis not present

## 2019-12-14 DIAGNOSIS — Z5111 Encounter for antineoplastic chemotherapy: Secondary | ICD-10-CM | POA: Insufficient documentation

## 2019-12-14 DIAGNOSIS — Z9221 Personal history of antineoplastic chemotherapy: Secondary | ICD-10-CM | POA: Diagnosis not present

## 2019-12-14 DIAGNOSIS — C880 Waldenstrom macroglobulinemia: Secondary | ICD-10-CM

## 2019-12-14 LAB — CMP (CANCER CENTER ONLY)
ALT: 17 U/L (ref 0–44)
AST: 17 U/L (ref 15–41)
Albumin: 3.2 g/dL — ABNORMAL LOW (ref 3.5–5.0)
Alkaline Phosphatase: 74 U/L (ref 38–126)
Anion gap: 13 (ref 5–15)
BUN: 18 mg/dL (ref 8–23)
CO2: 23 mmol/L (ref 22–32)
Calcium: 9.5 mg/dL (ref 8.9–10.3)
Chloride: 105 mmol/L (ref 98–111)
Creatinine: 1.42 mg/dL — ABNORMAL HIGH (ref 0.44–1.00)
GFR, Est AFR Am: 39 mL/min — ABNORMAL LOW (ref >60)
GFR, Estimated: 33 mL/min — ABNORMAL LOW (ref >60)
Glucose, Bld: 129 mg/dL — ABNORMAL HIGH (ref 70–99)
Potassium: 3.9 mmol/L (ref 3.5–5.1)
Sodium: 141 mmol/L (ref 135–145)
Total Bilirubin: 0.7 mg/dL (ref 0.3–1.2)
Total Protein: 8.6 g/dL — ABNORMAL HIGH (ref 6.5–8.1)

## 2019-12-14 LAB — CBC WITH DIFFERENTIAL (CANCER CENTER ONLY)
Abs Immature Granulocytes: 0.01 10*3/uL (ref 0.00–0.07)
Basophils Absolute: 0 10*3/uL (ref 0.0–0.1)
Basophils Relative: 1 %
Eosinophils Absolute: 0.1 10*3/uL (ref 0.0–0.5)
Eosinophils Relative: 2 %
HCT: 30.8 % — ABNORMAL LOW (ref 36.0–46.0)
Hemoglobin: 10 g/dL — ABNORMAL LOW (ref 12.0–15.0)
Immature Granulocytes: 0 %
Lymphocytes Relative: 20 %
Lymphs Abs: 0.8 10*3/uL (ref 0.7–4.0)
MCH: 29.5 pg (ref 26.0–34.0)
MCHC: 32.5 g/dL (ref 30.0–36.0)
MCV: 90.9 fL (ref 80.0–100.0)
Monocytes Absolute: 0.4 10*3/uL (ref 0.1–1.0)
Monocytes Relative: 10 %
Neutro Abs: 2.6 10*3/uL (ref 1.7–7.7)
Neutrophils Relative %: 67 %
Platelet Count: 387 10*3/uL (ref 150–400)
RBC: 3.39 MIL/uL — ABNORMAL LOW (ref 3.87–5.11)
RDW: 16.8 % — ABNORMAL HIGH (ref 11.5–15.5)
WBC Count: 3.9 10*3/uL — ABNORMAL LOW (ref 4.0–10.5)
nRBC: 0 % (ref 0.0–0.2)

## 2019-12-14 NOTE — Telephone Encounter (Signed)
Spoke with pt, she needs her refill for her albuterol but the pharmacy she uses doesn't take Medicare part B. Since [facility] has [facility] I am not sure if they are still [facility]. I will call [facility] in the morning to see if they have a pharmacy so I can send her meds in. Will hold until the morning.

## 2019-12-14 NOTE — Progress Notes (Signed)
[facility] OFFICE PROGRESS NOTE   Diagnosis: WaldenStrom's macroglobulinemia  INTERVAL HISTORY:   Natasha Ellis completed a cycle of bendamustine/rituximab beginning 11/27/2019.  She reports no symptom of an allergic reaction with rituximab.  She had itching in the hands 1 week following chemotherapy.  No rash.  She reports malaise following chemotherapy.  She has stable dyspnea.  No new complaint.  Objective:  Vital signs in last 24 hours:  There were no vitals taken for this visit.     Resp: Bilateral end expiratory wheeze, most prominent at the left base, no respiratory distress Cardio: Regular rate and rhythm GI: Nontender, no hepatosplenomegaly Vascular: No leg edema  Skin: No rash over the hands  Lab Results:  Lab Results  Component Value Date   WBC 3.9 (L) 12/14/2019   HGB 10.0 (L) 12/14/2019   HCT 30.8 (L) 12/14/2019   MCV 90.9 12/14/2019   PLT 387 12/14/2019   NEUTROABS 2.6 12/14/2019    CMP  Lab Results  Component Value Date   NA 141 12/14/2019   K 3.9 12/14/2019   CL 105 12/14/2019   CO2 23 12/14/2019   GLUCOSE 129 (H) 12/14/2019   BUN 18 12/14/2019   CREATININE 1.42 (H) 12/14/2019   CALCIUM 9.5 12/14/2019   PROT 8.6 (H) 12/14/2019   ALBUMIN 3.2 (L) 12/14/2019   AST 17 12/14/2019   ALT 17 12/14/2019   ALKPHOS 74 12/14/2019   BILITOT 0.7 12/14/2019   GFRNONAA 33 (L) 12/14/2019   GFRAA 39 (L) 12/14/2019    Medications: I have reviewed the patient's current medications.   Assessment/Plan: 1. 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 Treated with multiple systemic therapies includingpentostatin/Cytoxan/rituximab in 2010.   Cycle 1 bendamustine/Rituxan 05/31/2014.  Cycle 2 bendamustine/Rituxan 07/02/2014  Cycle 3 bendamustine/rituximab  07/30/2014  Cycle 4 bendamustine/rituximab 09/10/2014  Cycle 5 bendamustine/rituximab 10/22/2014  Cycle 6 bendamustine/rituximab 12/10/2014  PET scan 03/27/2019- diffuse small hypermetabolic lymph nodes in the neck, chest, abdomen, and pelvis, heterogenous hypermetabolism in the bones  CT chest 10/02/2019-bronchiectasis at the lung bases, 13 mm nodular area at the medial left hemidiaphragm, mildly enlarged mediastinal nodes-decreased from June 2020, slightly enlarged left axillary node-9 mm  Rising [name] level March and April 2021  Cycle 1 bendamustine/rituximab 11/27/2019 2. Left knee arthritis. Followed by Dr. Wynelle Link. 3. Sleep apnea. 4. Anemia. microcytic with peripheral blood smear findings consistent with iron deficiency 07/09/2015, serum iron studies consistent with "anemia of chronic disease ", stool Hemoccult cards negative 07/11/2015 5. Question left parotitis 12/12/2013 presenting with left facial and parotid edema. 6. Admission with pneumonia February 2016 7. History of Neutropenia secondary to chemotherapy, now with moderate neutropenia 8. Left lung pneumonia 04/02/2015, sputum culture positive for Moraxella 06/19/2015 9. Low IgG and IgA 10. CT chest 05/13/2015 with bilateral lower lung consolidation 11. CT chest, abdomen, and pelvis 01/08/2016-new peribronchial thickening and peripheral tree in bud pattern at the right lower lobe, no lymphadenopathy 12. Sinus mucosal thickening/opacification on CT 08/16/2015-status post sinus surgery 12/05/2015 13. Sputum culture positive for pseudomonas aeruginosa on 01/10/2016-treated with 10 days of ciprofloxacin 14. 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 Admission with pneumonia 08/26/2016 16.  Left lung nodule increased in size on a CT 01/06/2019, not hypermetabolic on the PET scan 0000000, followed by Dr. Elsworth Soho       Disposition:  Natasha Ellis appears stable.  She tolerated the bendamustine/rituximab well.  She  will complete cycle 2 on 12/26/2019.  She will be seen for an office visit on 12/26/2019.  We will check the [name] level on 12/26/2019.  [name], MD  12/14/2019  11:15 AM

## 2019-12-14 NOTE — Telephone Encounter (Signed)
Scheduled appt per 5/13 los - gave patient AVS and calender per los.  

## 2019-12-15 MED ORDER — ALBUTEROL SULFATE 2.5 MG/0.5ML IN NEBU: 2.5000 mg | INHALATION_SOLUTION | Freq: Every day | RESPIRATORY_TRACT | 2 refills | Status: DC

## 2019-12-15 NOTE — Telephone Encounter (Signed)
Called [facility]  Faxed rx for albuterol neb sol to them at [phone]  Spoke with the pt and notified this was done

## 2019-12-19 ENCOUNTER — Telehealth: Payer: Self-pay | Admitting: Pulmonary Disease

## 2019-12-19 ENCOUNTER — Encounter: Payer: Self-pay | Admitting: Pulmonary Disease

## 2019-12-19 ENCOUNTER — Other Ambulatory Visit: Payer: Self-pay

## 2019-12-19 ENCOUNTER — Ambulatory Visit (INDEPENDENT_AMBULATORY_CARE_PROVIDER_SITE_OTHER): Payer: Medicare Other | Admitting: Pulmonary Disease

## 2019-12-19 DIAGNOSIS — D869 Sarcoidosis, unspecified: Secondary | ICD-10-CM | POA: Diagnosis not present

## 2019-12-19 DIAGNOSIS — J479 Bronchiectasis, uncomplicated: Secondary | ICD-10-CM | POA: Diagnosis not present

## 2019-12-19 DIAGNOSIS — R911 Solitary pulmonary nodule: Secondary | ICD-10-CM | POA: Diagnosis not present

## 2019-12-19 MED ORDER — ALBUTEROL SULFATE 2.5 MG/0.5ML IN NEBU: 2.5000 mg | INHALATION_SOLUTION | Freq: Every day | RESPIRATORY_TRACT | 2 refills | Status: DC

## 2019-12-19 MED ORDER — BREO ELLIPTA 100-25 MCG/INH IN AEPB
1.0000 | INHALATION_SPRAY | Freq: Every day | RESPIRATORY_TRACT | 0 refills | Status: DC
Start: 2019-12-19 — End: 2019-12-28

## 2019-12-19 NOTE — Progress Notes (Signed)
   Subjective:    Patient ID: Natasha Ellis, female    DOB: [birth year], 86 y.o.   MRN: [id number]  HPI  86 yo smoker for FU  Of recurrent pneumonia, chronic cough &OSA -onCPAP 8 cm CHronic coughsince 2005  PMH - Low grade B cell diagnosed 2004. 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 Treated with multiple systemic therapies, Last 12/2014  - sinus surgery 12/2015 Natasha Ellis)   She has a 25 pack-year smoking history -Quit in [place]  Patient presents with  . Follow-up    pt has wheezing and sob and has started chemo therapy   Rising [name] level - -completed a cycle of bendamustine/rituximab , received steroids before it. She still complains of shortness of breath and wheezing. Using albuterol twice daily  Compliant with her CPAP  She is still worried about the lung nodule Not taking Mucinex anymore, has clear sputum mostly   Significant tests/ events reviewed  CT chest 10/2019 69mm LLL nodule, slight increase from 11 mm earlier, slight worse b-ectasis  PET scan March 27, 2019 extensive lymphadenopathy in the neck chest abdomen pelvis, hypermetabolism, no suspicious pulmonary nodules  CT angiogram 01/2019 showed interval increase in left lower lobe nodule up to 1.7 cm compared to CT from 08/2018 where this was 12 mm. On CT scan 01/2016, this was 7 mm  08/2015 CT sinus >>Extensive multifocal paranasal sinus disease, most marked in the ethmoid air cell complexes and in the left maxillary antrum    10/2015 ONO shows minimal desaturations; does not need O2.  01/2016 pansensitive pseudomonas in sputum  PFTs 12/2012 No airway obstruction ratio 74, FEV1 84%, DLCO 56% spirometry 5/2018does not show any airway obstruction, mild restriction   Review of Systems Patient denies significant dyspnea,cough, hemoptysis,  chest pain, palpitations, pedal edema,  orthopnea, paroxysmal nocturnal dyspnea, lightheadedness, nausea, vomiting, abdominal or  leg pains      Objective:   Physical Exam   Gen. Pleasant, obese, in no distress, in wheelchair ENT - no lesions, no post nasal drip Neck: No JVD, no thyromegaly, no carotid bruits Lungs: no use of accessory muscles, no dullness to percussion, decreased without rales or rhonchi  Cardiovascular: Rhythm regular, heart sounds  normal, no murmurs or gallops, no peripheral edema Musculoskeletal: No deformities, no cyanosis or clubbing , no tremors        Assessment & Plan:

## 2019-12-19 NOTE — Assessment & Plan Note (Signed)
She does seem to have some airway obstruction as reflected by recurrent bronchospasm although not reflected on her PFTs Sample of Breo 100 -1 puff daily, rinse mouth after use call us for prescription if no side effects. This would be a maintenance medication Hopefully you can cut down albuterol use to once or none daily after starting this  Hypertonic saline nebs 1-2 times daily to help clear sputum x 90 with 1 refill

## 2019-12-19 NOTE — Patient Instructions (Signed)
86-month follow-up CT chest without contrast for left lung nodule in September 2021  Good luck with your chemotherapy.  Sample of Breo 100 -1 puff daily, rinse mouth after use call us for prescription if no side effects. This would be a maintenance medication Hopefully you can cut down albuterol use to once or none daily after starting this  Hypertonic saline nebs 1-2 times daily to help clear sputum x 90 with 1 refill

## 2019-12-19 NOTE — Assessment & Plan Note (Signed)
54-month follow-up CT chest without contrast for left lung nodule in September 2021 If continues to increase in size may have to consider CT-guided biopsy, although PET negative makes it more likely that this is an enlarging benign nodule

## 2019-12-19 NOTE — Addendum Note (Signed)
Addended by: Satira Sark D on: 12/19/2019 03:00 PM   Modules accepted: Orders

## 2019-12-19 NOTE — Telephone Encounter (Signed)
Instructions    Return in about 4 months (around 04/20/2020) for after CT. 55-month follow-up CT chest without contrast for left lung nodule in September 2021  Good luck with your chemotherapy.  Sample of Breo 100 -1 puff daily, rinse mouth after use call us for prescription if no side effects. This would be a maintenance medication Hopefully you can cut down albuterol use to once or none daily after starting this  Hypertonic saline nebs 1-2 times daily to help clear sputum x 90 with 1 refill       Called and spoke with pt. Pt was wondering if it would be okay for her to just do the breo itself for about a week or two prior to beginning the hypertonic solution. She did not want to introduce two new medications at once in case she was to have a reaction to a medication not knowing which one it would be her having a reaction to if she had one. Dr. Elsworth Soho, please advise if you are fine with pt waiting on beginning the hypertonic solution and only doing breo at this time?

## 2019-12-20 NOTE — Progress Notes (Signed)
Pharmacist Chemotherapy Monitoring - Follow Up Assessment    I verify that I have reviewed each item in the below checklist:  . Regimen for the patient is scheduled for the appropriate day and plan matches scheduled date. Marland Kitchen Appropriate non-routine labs are ordered dependent on drug ordered. . If applicable, additional medications reviewed and ordered per protocol based on lifetime cumulative doses and/or treatment regimen.   Plan for follow-up and/or issues identified: No . I-vent associated with next due treatment: Yes . MD and/or nursing notified: No   Kennith Center, Pharm.D., CPP 12/20/2019@4 :46 PM

## 2019-12-20 NOTE — Telephone Encounter (Signed)
Okay with me 

## 2019-12-21 NOTE — Telephone Encounter (Signed)
Spoke with pt. She is aware of Dr. Alva's response. Nothing further was needed. 

## 2019-12-22 ENCOUNTER — Telehealth: Payer: Self-pay | Admitting: Pulmonary Disease

## 2019-12-22 NOTE — Telephone Encounter (Signed)
Spoke with pt, she states she spoke with them already. Will close encounter.

## 2019-12-24 ENCOUNTER — Other Ambulatory Visit: Payer: Self-pay | Admitting: Oncology

## 2019-12-26 ENCOUNTER — Telehealth: Payer: Self-pay | Admitting: Pulmonary Disease

## 2019-12-26 ENCOUNTER — Inpatient Hospital Stay (HOSPITAL_BASED_OUTPATIENT_CLINIC_OR_DEPARTMENT_OTHER): Payer: Medicare Other | Admitting: Nurse Practitioner

## 2019-12-26 ENCOUNTER — Other Ambulatory Visit: Payer: Self-pay

## 2019-12-26 ENCOUNTER — Inpatient Hospital Stay: Payer: Medicare Other

## 2019-12-26 ENCOUNTER — Encounter: Payer: Self-pay | Admitting: Nurse Practitioner

## 2019-12-26 ENCOUNTER — Inpatient Hospital Stay (HOSPITAL_BASED_OUTPATIENT_CLINIC_OR_DEPARTMENT_OTHER): Payer: Medicare Other | Admitting: Medical

## 2019-12-26 VITALS — BP 139/71 | HR 73 | Temp 97.7°F | Resp 20

## 2019-12-26 VITALS — BP 126/55 | HR 75 | Temp 97.7°F | Resp 18 | Ht 66.0 in | Wt 275.1 lb

## 2019-12-26 DIAGNOSIS — G4730 Sleep apnea, unspecified: Secondary | ICD-10-CM | POA: Diagnosis not present

## 2019-12-26 DIAGNOSIS — Z9221 Personal history of antineoplastic chemotherapy: Secondary | ICD-10-CM | POA: Diagnosis not present

## 2019-12-26 DIAGNOSIS — C880 Waldenstrom macroglobulinemia: Secondary | ICD-10-CM

## 2019-12-26 DIAGNOSIS — Z5111 Encounter for antineoplastic chemotherapy: Secondary | ICD-10-CM | POA: Diagnosis not present

## 2019-12-26 DIAGNOSIS — Z8673 Personal history of transient ischemic attack (TIA), and cerebral infarction without residual deficits: Secondary | ICD-10-CM | POA: Diagnosis not present

## 2019-12-26 DIAGNOSIS — G4489 Other headache syndrome: Secondary | ICD-10-CM

## 2019-12-26 DIAGNOSIS — Z5112 Encounter for antineoplastic immunotherapy: Secondary | ICD-10-CM | POA: Diagnosis not present

## 2019-12-26 LAB — CMP (CANCER CENTER ONLY)
ALT: 17 U/L (ref 0–44)
AST: 17 U/L (ref 15–41)
Albumin: 3.2 g/dL — ABNORMAL LOW (ref 3.5–5.0)
Alkaline Phosphatase: 80 U/L (ref 38–126)
Anion gap: 14 (ref 5–15)
BUN: 21 mg/dL (ref 8–23)
CO2: 23 mmol/L (ref 22–32)
Calcium: 9.2 mg/dL (ref 8.9–10.3)
Chloride: 105 mmol/L (ref 98–111)
Creatinine: 1.61 mg/dL — ABNORMAL HIGH (ref 0.44–1.00)
GFR, Est AFR Am: 33 mL/min — ABNORMAL LOW (ref >60)
GFR, Estimated: 29 mL/min — ABNORMAL LOW (ref >60)
Glucose, Bld: 135 mg/dL — ABNORMAL HIGH (ref 70–99)
Potassium: 3.5 mmol/L (ref 3.5–5.1)
Sodium: 142 mmol/L (ref 135–145)
Total Bilirubin: 1 mg/dL (ref 0.3–1.2)
Total Protein: 8.8 g/dL — ABNORMAL HIGH (ref 6.5–8.1)

## 2019-12-26 LAB — CBC WITH DIFFERENTIAL (CANCER CENTER ONLY)
Abs Immature Granulocytes: 0.02 10*3/uL (ref 0.00–0.07)
Basophils Absolute: 0 10*3/uL (ref 0.0–0.1)
Basophils Relative: 0 %
Eosinophils Absolute: 0.2 10*3/uL (ref 0.0–0.5)
Eosinophils Relative: 4 %
HCT: 28.9 % — ABNORMAL LOW (ref 36.0–46.0)
Hemoglobin: 9.7 g/dL — ABNORMAL LOW (ref 12.0–15.0)
Immature Granulocytes: 0 %
Lymphocytes Relative: 9 %
Lymphs Abs: 0.4 10*3/uL — ABNORMAL LOW (ref 0.7–4.0)
MCH: 29.4 pg (ref 26.0–34.0)
MCHC: 33.6 g/dL (ref 30.0–36.0)
MCV: 87.6 fL (ref 80.0–100.0)
Monocytes Absolute: 0.4 10*3/uL (ref 0.1–1.0)
Monocytes Relative: 9 %
Neutro Abs: 3.5 10*3/uL (ref 1.7–7.7)
Neutrophils Relative %: 78 %
Platelet Count: 337 10*3/uL (ref 150–400)
RBC: 3.3 MIL/uL — ABNORMAL LOW (ref 3.87–5.11)
RDW: 17.2 % — ABNORMAL HIGH (ref 11.5–15.5)
WBC Count: 4.5 10*3/uL (ref 4.0–10.5)
nRBC: 0 % (ref 0.0–0.2)

## 2019-12-26 LAB — LACTATE DEHYDROGENASE: LDH: 167 U/L (ref 98–192)

## 2019-12-26 MED ORDER — ACETAMINOPHEN 325 MG PO TABS
650.0000 mg | ORAL_TABLET | Freq: Once | ORAL | Status: AC
  Administered 2019-12-26: 650 mg via ORAL

## 2019-12-26 MED ORDER — SODIUM CHLORIDE 0.9 % IV SOLN
Freq: Once | INTRAVENOUS | Status: AC
  Filled 2019-12-26: qty 250

## 2019-12-26 MED ORDER — ACETAMINOPHEN 325 MG PO TABS
325.0000 mg | ORAL_TABLET | Freq: Once | ORAL | Status: AC
  Administered 2019-12-26: 325 mg via ORAL

## 2019-12-26 MED ORDER — ACETAMINOPHEN 325 MG PO TABS
ORAL_TABLET | ORAL | Status: AC
  Filled 2019-12-26: qty 2

## 2019-12-26 MED ORDER — DEXAMETHASONE SODIUM PHOSPHATE 10 MG/ML IJ SOLN
5.0000 mg | Freq: Once | INTRAMUSCULAR | Status: AC
  Administered 2019-12-26: 5 mg via INTRAVENOUS

## 2019-12-26 MED ORDER — SODIUM CHLORIDE 0.9 % IV SOLN
70.0000 mg/m2 | Freq: Once | INTRAVENOUS | Status: AC
  Administered 2019-12-26: 175 mg via INTRAVENOUS
  Filled 2019-12-26: qty 7

## 2019-12-26 MED ORDER — PALONOSETRON HCL INJECTION 0.25 MG/5ML
0.2500 mg | Freq: Once | INTRAVENOUS | Status: AC
  Administered 2019-12-26: 0.25 mg via INTRAVENOUS

## 2019-12-26 MED ORDER — DEXAMETHASONE SODIUM PHOSPHATE 10 MG/ML IJ SOLN
INTRAMUSCULAR | Status: AC
  Filled 2019-12-26: qty 1

## 2019-12-26 MED ORDER — DIPHENHYDRAMINE HCL 25 MG PO CAPS
50.0000 mg | ORAL_CAPSULE | Freq: Once | ORAL | Status: AC
  Administered 2019-12-26: 50 mg via ORAL

## 2019-12-26 MED ORDER — ACETAMINOPHEN 325 MG PO TABS
ORAL_TABLET | ORAL | Status: AC
  Filled 2019-12-26: qty 1

## 2019-12-26 MED ORDER — PALONOSETRON HCL INJECTION 0.25 MG/5ML
INTRAVENOUS | Status: AC
  Filled 2019-12-26: qty 5

## 2019-12-26 MED ORDER — SODIUM CHLORIDE 0.9 % IV SOLN
375.0000 mg/m2 | Freq: Once | INTRAVENOUS | Status: AC
  Administered 2019-12-26: 900 mg via INTRAVENOUS
  Filled 2019-12-26: qty 40

## 2019-12-26 MED ORDER — DIPHENHYDRAMINE HCL 25 MG PO CAPS
ORAL_CAPSULE | ORAL | Status: AC
  Filled 2019-12-26: qty 2

## 2019-12-26 NOTE — Progress Notes (Signed)
While this RN was at lunch the patient c/o headache at 12:37 of 4/10 and felt was reaction to Rituxan. Charge RN Amy notified and Rituxan paused. Tylenol administered and IVF administered. At 12:45 this RN restarted the Rituxan and then kept at same rate of 11mL/hr for another 30 min. Patient requested O2 and 2L placed on pt via [place]. No changes to patient baseline SOB. Sandi Mealy PA assessed patient.

## 2019-12-26 NOTE — Telephone Encounter (Signed)
Colletta Maryland from [employer] called back, provided fax #: [phone].  OV notes faxed to [place] with confirmation received.  Nothing further needed at this time- will close encounter.

## 2019-12-26 NOTE — Progress Notes (Addendum)
[facility] OFFICE PROGRESS NOTE   Diagnosis: WaldenStrom's macroglobulinemia  INTERVAL HISTORY:   Natasha Ellis returns as scheduled.  She completed cycle 1 bendamustine/Rituxan 11/27/2019, 11/28/2019.  She denies nausea/vomiting.  No mouth sores.  No diarrhea.  She felt "wiped out" for several days after the last treatment.  Last week she noticed a "lump" in the right groin region.  She has stable dyspnea.  Objective:  Vital signs in last 24 hours:  Blood pressure (!) 126/55, pulse 75, temperature 97.7 F (36.5 C), temperature source Temporal, resp. rate 18, height 5\' 6"  (1.676 m), weight 275 lb 1.6 oz (124.8 kg), SpO2 98 %.    HEENT: No thrush or ulcers. Lymphatics: 2 to 3 cm palpable node right inguinal region. Resp: Lungs clear bilaterally. Cardio: Regular rate and rhythm. GI: Abdomen soft and nontender.  No hepatosplenomegaly. Vascular: No leg edema.  Lab Results:  Lab Results  Component Value Date   WBC 4.5 12/26/2019   HGB 9.7 (L) 12/26/2019   HCT 28.9 (L) 12/26/2019   MCV 87.6 12/26/2019   PLT 337 12/26/2019   NEUTROABS 3.5 12/26/2019    Imaging:  No results found.  Medications: I have reviewed the patient's current medications.  Assessment/Plan: 1. 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 Treated with multiple systemic therapies includingpentostatin/Cytoxan/rituximab in 2010.   Cycle 1 bendamustine/Rituxan 05/31/2014.  Cycle 2 bendamustine/Rituxan 07/02/2014  Cycle 3 bendamustine/rituximab 07/30/2014  Cycle 4 bendamustine/rituximab 09/10/2014  Cycle 5 bendamustine/rituximab 10/22/2014  Cycle 6 bendamustine/rituximab 12/10/2014  PET scan 03/27/2019- diffuse small hypermetabolic lymph nodes in the neck, chest, abdomen, and pelvis, heterogenous hypermetabolism  in the bones  CT chest 10/02/2019-bronchiectasis at the lung bases, 13 mm nodular area at the medial left hemidiaphragm, mildly enlarged mediastinal nodes-decreased from June 2020, slightly enlarged left axillary node-9 mm  Rising [name] level March and April 2021  Cycle 1 bendamustine/rituximab 11/27/2019  Cycle 2 bendamustine/Rituxan 12/26/2019 2. Left knee arthritis. Followed by Dr. Wynelle Link. 3. Sleep apnea. 4. Anemia. microcytic with peripheral blood smear findings consistent with iron deficiency 07/09/2015, serum iron studies consistent with "anemia of chronic disease ", stool Hemoccult cards negative 07/11/2015 5. Question left parotitis 12/12/2013 presenting with left facial and parotid edema. 6. Admission with pneumonia February 2016 7. History of Neutropenia secondary to chemotherapy, now with moderate neutropenia 8. Left lung pneumonia 04/02/2015, sputum culture positive for Moraxella 06/19/2015 9. Low IgG and IgA 10. CT chest 05/13/2015 with bilateral lower lung consolidation 11. CT chest, abdomen, and pelvis 01/08/2016-new peribronchial thickening and peripheral tree in bud pattern at the right lower lobe, no lymphadenopathy 12. Sinus mucosal thickening/opacification on CT 08/16/2015-status post sinus surgery 12/05/2015 13. Sputum culture positive for pseudomonas aeruginosa on 01/10/2016-treated with 10 days of ciprofloxacin 14. 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 Admission with pneumonia 08/26/2016 16.  Left lung nodule increased in size on a CT 01/06/2019, not hypermetabolic on the PET scan 0000000, followed by Dr. Elsworth Soho    Disposition: Natasha Ellis appears unchanged.  She has completed 1 cycle of bendamustine/Rituxan.  Plan to proceed with cycle 2 today as scheduled.  We reviewed the CBC and chemistry panel from today.  Labs adequate to proceed.  She will return for lab, follow-up, cycle 3 bendamustine/Rituxan in 4 weeks.  She will contact the office in  the interim with any  problems.  Patient seen with Dr. Benay Spice.    Ned Card ANP/GNP-BC   12/26/2019  9:24 AM  This was a shared visit with Ned Card.  Natasha Ellis was interviewed and examined.  She appears stable.  We will follow the right groin lymph node as a marker of treatment response.  We will follow up on the [name] level from today.  Natasha Manson, MD

## 2019-12-26 NOTE — Telephone Encounter (Signed)
No fax number left to fax request OV notes to. [email or web address] for Otila Kluver at [facility] to obtain fax number.  Most recent OV note printed and held in triage, will await fax #.

## 2019-12-26 NOTE — Patient Instructions (Signed)
[facility] Discharge Instructions for Patients Receiving Chemotherapy  Today you received the following chemotherapy agents: Rituximab, bendamustine  To help prevent nausea and vomiting after your treatment, we encourage you to take your nausea medication as directed.   If you develop nausea and vomiting that is not controlled by your nausea medication, call the clinic.   BELOW ARE SYMPTOMS THAT SHOULD BE REPORTED IMMEDIATELY:  *FEVER GREATER THAN 100.5 F  *CHILLS WITH OR WITHOUT FEVER  NAUSEA AND VOMITING THAT IS NOT CONTROLLED WITH YOUR NAUSEA MEDICATION  *UNUSUAL SHORTNESS OF BREATH  *UNUSUAL BRUISING OR BLEEDING  TENDERNESS IN MOUTH AND THROAT WITH OR WITHOUT PRESENCE OF ULCERS  *URINARY PROBLEMS  *BOWEL PROBLEMS  UNUSUAL RASH Items with * indicate a potential emergency and should be followed up as soon as possible.  Feel free to call the clinic should you have any questions or concerns. The clinic phone number is [phone].  Please show the [facility] at check-in to the Emergency Department and triage nurse.

## 2019-12-27 ENCOUNTER — Inpatient Hospital Stay: Payer: Medicare Other

## 2019-12-27 ENCOUNTER — Telehealth (HOSPITAL_COMMUNITY): Payer: Self-pay | Admitting: Radiology

## 2019-12-27 ENCOUNTER — Other Ambulatory Visit: Payer: Self-pay

## 2019-12-27 VITALS — BP 104/58 | HR 70 | Temp 98.7°F | Resp 20

## 2019-12-27 DIAGNOSIS — Z8673 Personal history of transient ischemic attack (TIA), and cerebral infarction without residual deficits: Secondary | ICD-10-CM | POA: Diagnosis not present

## 2019-12-27 DIAGNOSIS — C880 Waldenstrom macroglobulinemia: Secondary | ICD-10-CM

## 2019-12-27 DIAGNOSIS — G4730 Sleep apnea, unspecified: Secondary | ICD-10-CM | POA: Diagnosis not present

## 2019-12-27 DIAGNOSIS — Z5111 Encounter for antineoplastic chemotherapy: Secondary | ICD-10-CM | POA: Diagnosis not present

## 2019-12-27 DIAGNOSIS — Z9221 Personal history of antineoplastic chemotherapy: Secondary | ICD-10-CM | POA: Diagnosis not present

## 2019-12-27 DIAGNOSIS — Z5112 Encounter for antineoplastic immunotherapy: Secondary | ICD-10-CM | POA: Diagnosis not present

## 2019-12-27 LAB — IGM: IgM (Immunoglobulin M), Srm: 4100 mg/dL — ABNORMAL HIGH (ref 26–217)

## 2019-12-27 MED ORDER — SODIUM CHLORIDE 0.9 % IV SOLN
Freq: Once | INTRAVENOUS | Status: AC
  Filled 2019-12-27: qty 250

## 2019-12-27 MED ORDER — SODIUM CHLORIDE 0.9 % IV SOLN
70.0000 mg/m2 | Freq: Once | INTRAVENOUS | Status: AC
  Administered 2019-12-27: 175 mg via INTRAVENOUS
  Filled 2019-12-27: qty 7

## 2019-12-27 MED ORDER — DEXAMETHASONE SODIUM PHOSPHATE 10 MG/ML IJ SOLN
5.0000 mg | Freq: Once | INTRAMUSCULAR | Status: AC
  Administered 2019-12-27: 5 mg via INTRAVENOUS

## 2019-12-27 MED ORDER — DEXAMETHASONE SODIUM PHOSPHATE 10 MG/ML IJ SOLN
INTRAMUSCULAR | Status: AC
  Filled 2019-12-27: qty 1

## 2019-12-27 NOTE — Telephone Encounter (Signed)
Called patient to schedule for yearly echocardiogram. Patient has declined echocardiogram at this time. She has other health issues to attend to at this time, and is not having issues with heart. The echo order will be removed from the work queue.

## 2019-12-27 NOTE — Patient Instructions (Signed)
[facility] Discharge Instructions for Patients Receiving Chemotherapy  Today you received the following chemotherapy agents Bendamustine (BENDEKA).  To help prevent nausea and vomiting after your treatment, we encourage you to take your nausea medication as prescribed.   If you develop nausea and vomiting that is not controlled by your nausea medication, call the clinic.   BELOW ARE SYMPTOMS THAT SHOULD BE REPORTED IMMEDIATELY:  *FEVER GREATER THAN 100.5 F  *CHILLS WITH OR WITHOUT FEVER  NAUSEA AND VOMITING THAT IS NOT CONTROLLED WITH YOUR NAUSEA MEDICATION  *UNUSUAL SHORTNESS OF BREATH  *UNUSUAL BRUISING OR BLEEDING  TENDERNESS IN MOUTH AND THROAT WITH OR WITHOUT PRESENCE OF ULCERS  *URINARY PROBLEMS  *BOWEL PROBLEMS  UNUSUAL RASH Items with * indicate a potential emergency and should be followed up as soon as possible.  Feel free to call the clinic should you have any questions or concerns. The clinic phone number is [phone].  Please show the CHEMO ALERT CARD at check-in to the Emergency Department and triage nurse.   

## 2019-12-28 ENCOUNTER — Telehealth: Payer: Self-pay | Admitting: Pulmonary Disease

## 2019-12-28 MED ORDER — BREO ELLIPTA 100-25 MCG/INH IN AEPB: 1.0000 | INHALATION_SPRAY | Freq: Every day | RESPIRATORY_TRACT | 3 refills | Status: DC

## 2019-12-28 NOTE — Progress Notes (Signed)
Natasha Ellis was seen in the infusion room today as she was receiving Ruxience.  She had received 650 mg of Tylenol p.o. x1 as part of her premeds but then reported that she had a 4/10 headache and was concerned that it was a reaction to neuropathy.  Ruxience was paused briefly with an extra 325 mg of Tylenol given.  He was then restarted.  The patient requested oxygen and was placed on 2 L via nasal cannula.  She has baseline shortness of breath which is unchanged.  She was able to continue Rituxan and was able to complete her infusion today.  Sandi Mealy, MHS, PA-C Physician Assistant

## 2019-12-28 NOTE — Telephone Encounter (Signed)
Spoke with patient regarding refill on Breo sent to [place]. Advised patient I sent in her refill request to [facility] patient is aware and voice is understanding. Nothing else further needed.

## 2020-01-02 ENCOUNTER — Encounter (HOSPITAL_COMMUNITY): Payer: Self-pay

## 2020-01-02 ENCOUNTER — Other Ambulatory Visit: Payer: Self-pay

## 2020-01-02 ENCOUNTER — Inpatient Hospital Stay (HOSPITAL_COMMUNITY)
Admission: EM | Admit: 2020-01-02 | Discharge: 2020-01-06 | DRG: 378 | Disposition: A | Discharge status: Home Health | Payer: Medicare Other | Attending: Internal Medicine | Admitting: Internal Medicine

## 2020-01-02 DIAGNOSIS — G4733 Obstructive sleep apnea (adult) (pediatric): Secondary | ICD-10-CM | POA: Diagnosis present

## 2020-01-02 DIAGNOSIS — I712 Thoracic aortic aneurysm, without rupture: Secondary | ICD-10-CM | POA: Diagnosis present

## 2020-01-02 DIAGNOSIS — Z7951 Long term (current) use of inhaled steroids: Secondary | ICD-10-CM

## 2020-01-02 DIAGNOSIS — Z823 Family history of stroke: Secondary | ICD-10-CM | POA: Diagnosis not present

## 2020-01-02 DIAGNOSIS — Z66 Do not resuscitate: Secondary | ICD-10-CM | POA: Diagnosis present

## 2020-01-02 DIAGNOSIS — Z20822 Contact with and (suspected) exposure to covid-19: Secondary | ICD-10-CM | POA: Diagnosis present

## 2020-01-02 DIAGNOSIS — K5733 Diverticulitis of large intestine without perforation or abscess with bleeding: Principal | ICD-10-CM | POA: Diagnosis present

## 2020-01-02 DIAGNOSIS — J45909 Unspecified asthma, uncomplicated: Secondary | ICD-10-CM | POA: Diagnosis present

## 2020-01-02 DIAGNOSIS — Z885 Allergy status to narcotic agent status: Secondary | ICD-10-CM

## 2020-01-02 DIAGNOSIS — K5790 Diverticulosis of intestine, part unspecified, without perforation or abscess without bleeding: Secondary | ICD-10-CM | POA: Diagnosis not present

## 2020-01-02 DIAGNOSIS — M47816 Spondylosis without myelopathy or radiculopathy, lumbar region: Secondary | ICD-10-CM | POA: Diagnosis present

## 2020-01-02 DIAGNOSIS — I10 Essential (primary) hypertension: Secondary | ICD-10-CM | POA: Diagnosis not present

## 2020-01-02 DIAGNOSIS — I129 Hypertensive chronic kidney disease with stage 1 through stage 4 chronic kidney disease, or unspecified chronic kidney disease: Secondary | ICD-10-CM | POA: Diagnosis present

## 2020-01-02 DIAGNOSIS — Z7901 Long term (current) use of anticoagulants: Secondary | ICD-10-CM

## 2020-01-02 DIAGNOSIS — N1832 Chronic kidney disease, stage 3b: Secondary | ICD-10-CM | POA: Diagnosis present

## 2020-01-02 DIAGNOSIS — Z888 Allergy status to other drugs, medicaments and biological substances status: Secondary | ICD-10-CM

## 2020-01-02 DIAGNOSIS — C880 Waldenstrom macroglobulinemia: Secondary | ICD-10-CM | POA: Diagnosis present

## 2020-01-02 DIAGNOSIS — E785 Hyperlipidemia, unspecified: Secondary | ICD-10-CM | POA: Diagnosis present

## 2020-01-02 DIAGNOSIS — K589 Irritable bowel syndrome without diarrhea: Secondary | ICD-10-CM | POA: Diagnosis present

## 2020-01-02 DIAGNOSIS — D62 Acute posthemorrhagic anemia: Secondary | ICD-10-CM | POA: Diagnosis present

## 2020-01-02 DIAGNOSIS — M170 Bilateral primary osteoarthritis of knee: Secondary | ICD-10-CM | POA: Diagnosis present

## 2020-01-02 DIAGNOSIS — Z8572 Personal history of non-Hodgkin lymphomas: Secondary | ICD-10-CM | POA: Diagnosis not present

## 2020-01-02 DIAGNOSIS — Z881 Allergy status to other antibiotic agents status: Secondary | ICD-10-CM

## 2020-01-02 DIAGNOSIS — K219 Gastro-esophageal reflux disease without esophagitis: Secondary | ICD-10-CM | POA: Diagnosis present

## 2020-01-02 DIAGNOSIS — Z8673 Personal history of transient ischemic attack (TIA), and cerebral infarction without residual deficits: Secondary | ICD-10-CM

## 2020-01-02 DIAGNOSIS — I7121 Aneurysm of the ascending aorta, without rupture: Secondary | ICD-10-CM | POA: Diagnosis present

## 2020-01-02 DIAGNOSIS — Z6841 Body Mass Index (BMI) 40.0 and over, adult: Secondary | ICD-10-CM

## 2020-01-02 DIAGNOSIS — N1831 Chronic kidney disease, stage 3a: Secondary | ICD-10-CM | POA: Diagnosis not present

## 2020-01-02 DIAGNOSIS — Z882 Allergy status to sulfonamides status: Secondary | ICD-10-CM

## 2020-01-02 DIAGNOSIS — Z803 Family history of malignant neoplasm of breast: Secondary | ICD-10-CM | POA: Diagnosis not present

## 2020-01-02 DIAGNOSIS — Z87891 Personal history of nicotine dependence: Secondary | ICD-10-CM

## 2020-01-02 DIAGNOSIS — N183 Chronic kidney disease, stage 3 unspecified: Secondary | ICD-10-CM | POA: Diagnosis present

## 2020-01-02 DIAGNOSIS — K625 Hemorrhage of anus and rectum: Secondary | ICD-10-CM | POA: Diagnosis not present

## 2020-01-02 DIAGNOSIS — Z8579 Personal history of other malignant neoplasms of lymphoid, hematopoietic and related tissues: Secondary | ICD-10-CM | POA: Diagnosis not present

## 2020-01-02 DIAGNOSIS — K5792 Diverticulitis of intestine, part unspecified, without perforation or abscess without bleeding: Secondary | ICD-10-CM | POA: Diagnosis not present

## 2020-01-02 DIAGNOSIS — R58 Hemorrhage, not elsewhere classified: Secondary | ICD-10-CM | POA: Diagnosis not present

## 2020-01-02 DIAGNOSIS — Z8249 Family history of ischemic heart disease and other diseases of the circulatory system: Secondary | ICD-10-CM | POA: Diagnosis not present

## 2020-01-02 DIAGNOSIS — R0902 Hypoxemia: Secondary | ICD-10-CM | POA: Diagnosis not present

## 2020-01-02 DIAGNOSIS — R197 Diarrhea, unspecified: Secondary | ICD-10-CM | POA: Diagnosis not present

## 2020-01-02 DIAGNOSIS — Z880 Allergy status to penicillin: Secondary | ICD-10-CM

## 2020-01-02 DIAGNOSIS — I480 Paroxysmal atrial fibrillation: Secondary | ICD-10-CM | POA: Diagnosis present

## 2020-01-02 DIAGNOSIS — M1990 Unspecified osteoarthritis, unspecified site: Secondary | ICD-10-CM | POA: Diagnosis present

## 2020-01-02 LAB — PROTIME-INR
INR: 1.4 — ABNORMAL HIGH (ref 0.8–1.2)
Prothrombin Time: 17 seconds — ABNORMAL HIGH (ref 11.4–15.2)

## 2020-01-02 LAB — COMPREHENSIVE METABOLIC PANEL
ALT: 23 U/L (ref 0–44)
AST: 25 U/L (ref 15–41)
Albumin: 3.2 g/dL — ABNORMAL LOW (ref 3.5–5.0)
Alkaline Phosphatase: 66 U/L (ref 38–126)
Anion gap: 12 (ref 5–15)
BUN: 28 mg/dL — ABNORMAL HIGH (ref 8–23)
CO2: 26 mmol/L (ref 22–32)
Calcium: 9.2 mg/dL (ref 8.9–10.3)
Chloride: 100 mmol/L (ref 98–111)
Creatinine, Ser: 1.67 mg/dL — ABNORMAL HIGH (ref 0.44–1.00)
GFR calc Af Amer: 32 mL/min — ABNORMAL LOW (ref >60)
GFR calc non Af Amer: 27 mL/min — ABNORMAL LOW (ref >60)
Glucose, Bld: 111 mg/dL — ABNORMAL HIGH (ref 70–99)
Potassium: 4.4 mmol/L (ref 3.5–5.1)
Sodium: 138 mmol/L (ref 135–145)
Total Bilirubin: 0.9 mg/dL (ref 0.3–1.2)
Total Protein: 7.8 g/dL (ref 6.5–8.1)

## 2020-01-02 LAB — CBC
HCT: 27.5 % — ABNORMAL LOW (ref 36.0–46.0)
Hemoglobin: 9.5 g/dL — ABNORMAL LOW (ref 12.0–15.0)
MCH: 31.6 pg (ref 26.0–34.0)
MCHC: 34.5 g/dL (ref 30.0–36.0)
MCV: 91.4 fL (ref 80.0–100.0)
Platelets: 315 10*3/uL (ref 150–400)
RBC: 3.01 MIL/uL — ABNORMAL LOW (ref 3.87–5.11)
RDW: 20.6 % — ABNORMAL HIGH (ref 11.5–15.5)
WBC: 4.7 10*3/uL (ref 4.0–10.5)
nRBC: 0 % (ref 0.0–0.2)

## 2020-01-02 LAB — CBC WITH DIFFERENTIAL/PLATELET
Abs Immature Granulocytes: 0.03 10*3/uL (ref 0.00–0.07)
Basophils Absolute: 0 10*3/uL (ref 0.0–0.1)
Basophils Relative: 1 %
Eosinophils Absolute: 0.1 10*3/uL (ref 0.0–0.5)
Eosinophils Relative: 1 %
HCT: 26.9 % — ABNORMAL LOW (ref 36.0–46.0)
Hemoglobin: 10.1 g/dL — ABNORMAL LOW (ref 12.0–15.0)
Immature Granulocytes: 1 %
Lymphocytes Relative: 21 %
Lymphs Abs: 1 10*3/uL (ref 0.7–4.0)
MCH: 34.7 pg — ABNORMAL HIGH (ref 26.0–34.0)
MCHC: 37.5 g/dL — ABNORMAL HIGH (ref 30.0–36.0)
MCV: 92.4 fL (ref 80.0–100.0)
Monocytes Absolute: 0.4 10*3/uL (ref 0.1–1.0)
Monocytes Relative: 8 %
Neutro Abs: 3.1 10*3/uL (ref 1.7–7.7)
Neutrophils Relative %: 68 %
Platelets: 345 10*3/uL (ref 150–400)
RBC: 2.91 MIL/uL — ABNORMAL LOW (ref 3.87–5.11)
RDW: 20.5 % — ABNORMAL HIGH (ref 11.5–15.5)
WBC: 4.6 10*3/uL (ref 4.0–10.5)
nRBC: 0 % (ref 0.0–0.2)

## 2020-01-02 LAB — POC OCCULT BLOOD, ED: Fecal Occult Bld: POSITIVE — AB

## 2020-01-02 MED ORDER — ONDANSETRON HCL 4 MG/2ML IJ SOLN: 4.0000 mg | Freq: Four times a day (QID) | INTRAMUSCULAR | Status: DC | PRN

## 2020-01-02 MED ORDER — ALBUTEROL SULFATE (2.5 MG/3ML) 0.083% IN NEBU
2.5000 mg | INHALATION_SOLUTION | Freq: Four times a day (QID) | RESPIRATORY_TRACT | Status: DC | PRN
  Administered 2020-01-04: 2.5 mg via RESPIRATORY_TRACT
  Filled 2020-01-02 (×2): qty 3

## 2020-01-02 MED ORDER — FLUTICASONE FUROATE-VILANTEROL 100-25 MCG/INH IN AEPB
1.0000 | INHALATION_SPRAY | Freq: Every day | RESPIRATORY_TRACT | Status: DC
  Administered 2020-01-04 – 2020-01-06 (×3): 1 via RESPIRATORY_TRACT
  Filled 2020-01-02: qty 28

## 2020-01-02 MED ORDER — ALBUTEROL SULFATE HFA 108 (90 BASE) MCG/ACT IN AERS: 2.0000 | INHALATION_SPRAY | Freq: Four times a day (QID) | RESPIRATORY_TRACT | Status: DC | PRN

## 2020-01-02 MED ORDER — ONDANSETRON HCL 4 MG PO TABS: 4.0000 mg | ORAL_TABLET | Freq: Four times a day (QID) | ORAL | Status: DC | PRN

## 2020-01-02 MED ORDER — DILTIAZEM HCL ER COATED BEADS 180 MG PO CP24
180.0000 mg | ORAL_CAPSULE | Freq: Every day | ORAL | Status: DC
  Administered 2020-01-03 – 2020-01-06 (×4): 180 mg via ORAL
  Filled 2020-01-02 (×4): qty 1

## 2020-01-02 NOTE — ED Notes (Signed)
Purewick placed on patient to collect urine sample. Pt made aware that urine sample may be needed.

## 2020-01-02 NOTE — ED Notes (Signed)
Pt put on bedside commode. This Probation officer and Clovis Cao, RN had to assist patient

## 2020-01-02 NOTE — H&P (Signed)
History and Physical    NOTA BEW [id number] DOB: 1931-01-17 DOA: 01/02/2020  PCP: Lavone Orn, MD  Patient coming from: Home.  Chief Complaint: Rectal bleeding.  HPI: Natasha Ellis is a 86 y.o. female with history of lymphoma and Waldenstrm macroglobulinemia who had received Ruxience on Dec 26, 2019 had constipation following which and had taken laxatives started having some diarrhea and today patient had large bloody bowel movement.  Did not have any abdominal pain or nausea vomiting.  Denies any fever or chills.  Patient does take apixaban for A. fib.  ED Course: In the ER patient initially was mildly on the hypotensive side but improved with fluids.  Labs show hemoglobin at baseline around 10.1 platelets were normal creatinine 1.6 at baseline.  Covid test negative.  Given that patient has acute GI bleed in the setting of using apixaban will need close monitoring admitted for further management.  Review of Systems: As per HPI, rest all negative.   Past Medical History:  Diagnosis Date  . Arthritis   . Asthma   . Cancer ([place])    Lymphoma  . Cataract   . Chronic cough   . Diverticulosis of intestine    H/O  . DJD (degenerative joint disease) of lumbar spine   . IBS (irritable bowel syndrome)   . Left knee DJD   . 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 Right knee DJD   . Sleep apnea     Past Surgical History:  Procedure Laterality Date  . ABDOMINAL HYSTERECTOMY  1984   TAH  . EYE SURGERY Bilateral    with lens replacement  . EYE SURGERY Left    retnia  . KNEE ARTHROSCOPY Left   . NASAL SINUS SURGERY N/A 12/04/2015   Procedure: ENDOSCOPIC SINUS SURGERY;  Surgeon: Izora Gala, MD;  Location: [facility];  Service: ENT;  Laterality: N/A;  . TEE WITHOUT CARDIOVERSION N/A 04/14/2016   Procedure: TRANSESOPHAGEAL ECHOCARDIOGRAM (TEE);  Surgeon: Lelon Perla, MD;  Location: [facility] : [facility] ENDOSCOPY;  Service: Cardiovascular;  Laterality: N/A;  .  TONSILLECTOMY AND ADENOIDECTOMY  childhood     reports that she quit smoking about 40 years ago. Her smoking use included cigarettes. She has a 25.00 pack-year smoking history. She has never used smokeless tobacco. She reports that she does not drink alcohol or use drugs.  Allergies  Allergen Reactions  . Codeine Other (See Comments)    Crazy-nightmares  . Amoxicillin Diarrhea    Has patient had a PCN reaction causing immediate rash, facial/tongue/throat swelling, SOB or lightheadedness with hypotension:  No Has patient had a PCN reaction causing severe rash involving mucus membranes or skin necrosis: No Has patient had a PCN reaction that required hospitalization No Has patient had a PCN reaction occurring within the last 10 years: Unknown--rx is diarrhea. If all of the above answers are "NO", then may proceed with Cephalosporin use.    . Cefdinir Cough  . Doxycycline Diarrhea  . Erythromycin Other (See Comments)    GI upset  . Lisinopril Cough  . Oxybutynin Other (See Comments)    Dry mouth  . Sulfa Antibiotics Other (See Comments)    GI upset    Family History  Problem Relation Age of Onset  . Stroke Father   . Heart failure Mother 100  . Breast cancer Sister     Prior to Admission medications   Medication Sig Start Date End Date Taking? Authorizing Provider  acetaminophen (TYLENOL) 500  MG tablet Take 1,000 mg by mouth every 6 (six) hours as needed for pain.    Yes [provider]  albuterol (PROAIR HFA) 108 (90 Base) MCG/ACT inhaler Inhale 2 puffs into the lungs every 6 (six) hours as needed for wheezing or shortness of breath. 02/14/16  Yes Parrett, Tammy S, NP  Albuterol Sulfate 2.5 MG/0.5ML NEBU Inhale 0.5 mLs (2.5 mg total) into the lungs daily. Patient taking differently: Inhale 2.5 mg into the lungs every 6 (six) hours as needed (sob/wheezing).  12/19/19  Yes Rigoberto Noel, MD  apixaban (ELIQUIS) 5 MG TABS tablet Take 1 tablet (5 mg total) by mouth 2 (two)  times daily. 01/30/19  Yes Burtis Junes, NP  calcium carbonate (TUMS - DOSED IN MG ELEMENTAL CALCIUM) 500 MG chewable tablet Chew 1 tablet by mouth 2 (two) times daily as needed for indigestion or heartburn.   Yes [provider]  calcium gluconate 500 MG tablet Take 1 tablet by mouth daily.   Yes [provider]  diltiazem (CARDIZEM CD) 180 MG 24 hr capsule Take 1 capsule (180 mg total) by mouth daily. 03/14/19 09/11/20 Yes Burtis Junes, NP  ferrous sulfate 325 (65 FE) MG tablet Take 325 mg by mouth daily.    Yes [provider]  fluticasone furoate-vilanterol (BREO ELLIPTA) 100-25 MCG/INH AEPB Inhale 1 puff into the lungs daily. 12/28/19  Yes Rigoberto Noel, MD  furosemide (LASIX) 40 MG tablet Take 40 mg by mouth daily.  03/10/19  Yes [provider]  potassium chloride (K-DUR,KLOR-CON) 10 MEQ tablet Take 10 mEq by mouth daily.  10/11/17  Yes [provider]  sodium chloride (OCEAN) 0.65 % SOLN nasal spray Place 1 spray into both nostrils 3 (three) times daily as needed for congestion.   Yes [provider]  Respiratory Therapy Supplies (FLUTTER) DEVI Use as directed. 09/06/18   Parrett, Fonnie Mu, NP    Physical Exam: Constitutional: Moderately built and nourished. Vitals:   01/02/20 2030 01/02/20 2045 01/02/20 2100 01/02/20 2115  BP: (!) 124/48 (!) 108/51 (!) 124/58 128/68  Pulse: 81 83 80 79  Resp: (!) 29 (!) 27 (!) 29 (!) 28  Temp:      TempSrc:      SpO2: 94% 96% 92% 91%  Weight:      Height:       Eyes: Anicteric no pallor. ENMT: No discharge from the ears eyes nose or mouth. Neck: No mass felt.  No neck rigidity. Respiratory: No rhonchi or crepitations. Cardiovascular: S1-S2 heard. Abdomen: Soft nontender bowel sounds present. Musculoskeletal: No edema. Skin: No rash. Neurologic: Alert awake oriented to time place and person.  Moves all extremities. Psychiatric: Appears normal per normal affect.   Labs on Admission: I  have personally reviewed following labs and imaging studies  CBC: Recent Labs  Lab 01/02/20 2030  WBC 4.6  NEUTROABS 3.1  HGB 10.1*  HCT 26.9*  MCV 92.4  PLT 123456   Basic Metabolic Panel: Recent Labs  Lab 01/02/20 2030  NA 138  K 4.4  CL 100  CO2 26  GLUCOSE 111*  BUN 28*  CREATININE 1.67*  CALCIUM 9.2   GFR: Estimated Creatinine Clearance: 32.6 mL/min (A) (by C-G formula based on SCr of 1.67 mg/dL (H)). Liver Function Tests: Recent Labs  Lab 01/02/20 2030  AST 25  ALT 23  ALKPHOS 66  BILITOT 0.9  PROT 7.8  ALBUMIN 3.2*   No results for input(s): LIPASE, AMYLASE in the last  168 hours. No results for input(s): AMMONIA in the last 168 hours. Coagulation Profile: Recent Labs  Lab 01/02/20 2030  INR 1.4*   Cardiac Enzymes: No results for input(s): CKTOTAL, CKMB, CKMBINDEX, TROPONINI in the last 168 hours. BNP (last 3 results) Recent Labs    03/14/19 1210 09/13/19 0938  PROBNP 445 133.0*   HbA1C: No results for input(s): HGBA1C in the last 72 hours. CBG: No results for input(s): GLUCAP in the last 168 hours. Lipid Profile: No results for input(s): CHOL, HDL, LDLCALC, TRIG, CHOLHDL, LDLDIRECT in the last 72 hours. Thyroid Function Tests: No results for input(s): TSH, T4TOTAL, FREET4, T3FREE, THYROIDAB in the last 72 hours. Anemia Panel: No results for input(s): VITAMINB12, FOLATE, FERRITIN, TIBC, IRON, RETICCTPCT in the last 72 hours. Urine analysis:    Component Value Date/Time   COLORURINE AMBER (A) 01/06/2019 1823   APPEARANCEUR CLOUDY (A) 01/06/2019 1823   LABSPEC 1.016 01/06/2019 1823   PHURINE 5.0 01/06/2019 1823   GLUCOSEU NEGATIVE 01/06/2019 1823   HGBUR NEGATIVE 01/06/2019 1823   BILIRUBINUR NEGATIVE 01/06/2019 1823   BILIRUBINUR neg 04/20/2013 1728   KETONESUR NEGATIVE 01/06/2019 1823   PROTEINUR NEGATIVE 01/06/2019 1823   UROBILINOGEN 0.2 05/21/2015 1920   NITRITE NEGATIVE 01/06/2019 1823   LEUKOCYTESUR NEGATIVE 01/06/2019 1823    Sepsis Labs: @LABRCNTIP (procalcitonin:4,lacticidven:4) )No results found for this or any previous visit (from the past 240 hour(s)).   Radiological Exams on Admission: No results found.    Assessment/Plan Principal Problem:   Rectal bleeding Active Problems:   HTN (hypertension)   Asthma   Waldenstrom's macroglobulinemia (HCC)   OSA (obstructive sleep apnea)   History of lymphoma   Ascending aortic aneurysm (HCC)   CKD (chronic kidney disease) stage 3, GFR 30-59 ml/min   PAF (paroxysmal atrial fibrillation) ([place])    1. Acute GI bleed -given the patient has frank GI bleed suspect could be lower GI.  Patient has agreed to hold off on apixaban due to acute GI bleed.  Will check serial CBCs.  N.p.o. IV fluids consult GI.  Protonix. 2. Paroxysmal atrial fibrillation holding apixaban after discussed with patient who is agreeable to holding apixaban.  Cardizem for rate control. 3. History of lymphoma and Waldenstrm's macroglobulinemia being followed by Dr. [name] patient's oncologist had received immunotherapy last week. 4. History of chronic shortness of breath and bronchiectasis and lung nodules being followed by pulmonologist.  Since patient has acute GI bleed in the setting of apixaban will need close monitoring and inpatient status.   DVT prophylaxis: SCDs.  Avoiding anticoagulation due to acute GI bleed. Code Status: DNR. Family Communication: Discussed with patient. Disposition Plan: Lives in independent living facility. Consults called: None. Admission status: Inpatient.   Rise Patience MD [facility] Pager [phone].  If 7PM-7AM, please contact night-coverage [email or web address] Password [email or web address]  01/02/2020, 10:41 PM

## 2020-01-02 NOTE — ED Provider Notes (Signed)
[facility] DEPT Provider Note   CSN: [id number] Arrival date & time: 01/02/20  1931     History Chief Complaint  Patient presents with  . Rectal Bleeding    Natasha Ellis is a 86 y.o. female.  HPI Patient with history of lymphoma and afib, on Eliquis, reports she had chemo last week and has been feeling weak since then. She reports some constipation that resolved over the weekend and since then has had some loose stools. Today she reports she filled the commode with blood. Denies chest pain, SOB, N/V. Has had a good appetite.     Past Medical History:  Diagnosis Date  . Arthritis   . Asthma   . Cancer ([place])    Lymphoma  . Cataract   . Chronic cough   . Diverticulosis of intestine    H/O  . DJD (degenerative joint disease) of lumbar spine   . IBS (irritable bowel syndrome)   . Left knee DJD   . 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 Right knee DJD   . Sleep apnea     Patient Active Problem List   Diagnosis Date Noted  . Rectal bleeding 01/02/2020  . PAF (paroxysmal atrial fibrillation) ([place]) 01/02/2020  . Goals of care, counseling/discussion 11/14/2019  . CKD (chronic kidney disease) stage 3, GFR 30-59 ml/min 01/08/2019  . Physical deconditioning 09/15/2018  . Bronchiectasis ([place]) 09/06/2018  . Lung nodule 09/06/2018  . Ascending aortic aneurysm ([place]) 09/06/2018  . Hypoxia 08/15/2016  . Severe obesity (BMI >= 40) ([place]) 07/30/2015  . Recurrent pneumonia 06/19/2015  . Anemia 06/10/2015  . Anemia due to other cause 06/10/2015  . OSA (obstructive sleep apnea) 09/20/2014  . History of lymphoma 09/20/2014  . History of TIA (transient ischemic attack) 09/20/2014  . Diarrhea   . Pain in gums 08/15/2014  . Neutropenia ([place]) 08/15/2014  . Hypersensitivity reaction 05/31/2014  . Waldenstrom's macroglobulinemia ([place]) 04/19/2014  . HTN (hypertension) 04/29/2013  . HLD (hyperlipidemia) 04/29/2013  .  Lacunar infarction ([place]) 04/29/2013  . GERD (gastroesophageal reflux disease) 04/29/2013  . TIA (transient ischemic attack) 04/29/2013  . Back pain 04/29/2013  . Asthma 04/29/2013  . Dyspnea 01/25/2013  . Chronic cough 01/01/2013    Past Surgical History:  Procedure Laterality Date  . ABDOMINAL HYSTERECTOMY  1984   TAH  . EYE SURGERY Bilateral    with lens replacement  . EYE SURGERY Left    retnia  . KNEE ARTHROSCOPY Left   . NASAL SINUS SURGERY N/A 12/04/2015   Procedure: ENDOSCOPIC SINUS SURGERY;  Surgeon: Izora Gala, MD;  Location: [facility];  Service: ENT;  Laterality: N/A;  . TEE WITHOUT CARDIOVERSION N/A 04/14/2016   Procedure: TRANSESOPHAGEAL ECHOCARDIOGRAM (TEE);  Surgeon: Lelon Perla, MD;  Location: [facility] ENDOSCOPY;  Service: Cardiovascular;  Laterality: N/A;  . TONSILLECTOMY AND ADENOIDECTOMY  childhood     OB History    Gravida  0   Para  0   Term  0   Preterm  0   AB  0   Living        SAB  0   TAB  0   Ectopic  0   Multiple      Live Births              Family History  Problem Relation Age of Onset  . Stroke Father   . Heart failure Mother 100  . Breast cancer Sister  Social History   Tobacco Use  . Smoking status: Former Smoker    Packs/day: 1.00    Years: 25.00    Pack years: 25.00    Types: Cigarettes    Quit date: 08/04/1979    Years since quitting: 40.4  . Smokeless tobacco: Never Used  Substance Use Topics  . Alcohol use: No  . Drug use: No    Home Medications Prior to Admission medications   Medication Sig Start Date End Date Taking? Authorizing Provider  acetaminophen (TYLENOL) 500 MG tablet Take 1,000 mg by mouth every 6 (six) hours as needed for pain.    Yes [provider]  albuterol (PROAIR HFA) 108 (90 Base) MCG/ACT inhaler Inhale 2 puffs into the lungs every 6 (six) hours as needed for wheezing or shortness of breath. 02/14/16  Yes Parrett, Tammy S, NP  Albuterol Sulfate 2.5 MG/0.5ML NEBU Inhale 0.5 mLs  (2.5 mg total) into the lungs daily. Patient taking differently: Inhale 2.5 mg into the lungs every 6 (six) hours as needed (sob/wheezing).  12/19/19  Yes Rigoberto Noel, MD  apixaban (ELIQUIS) 5 MG TABS tablet Take 1 tablet (5 mg total) by mouth 2 (two) times daily. 01/30/19  Yes Burtis Junes, NP  calcium carbonate (TUMS - DOSED IN MG ELEMENTAL CALCIUM) 500 MG chewable tablet Chew 1 tablet by mouth 2 (two) times daily as needed for indigestion or heartburn.   Yes [provider]  calcium gluconate 500 MG tablet Take 1 tablet by mouth daily.   Yes [provider]  diltiazem (CARDIZEM CD) 180 MG 24 hr capsule Take 1 capsule (180 mg total) by mouth daily. 03/14/19 09/11/20 Yes Burtis Junes, NP  ferrous sulfate 325 (65 FE) MG tablet Take 325 mg by mouth daily.    Yes [provider]  fluticasone furoate-vilanterol (BREO ELLIPTA) 100-25 MCG/INH AEPB Inhale 1 puff into the lungs daily. 12/28/19  Yes Rigoberto Noel, MD  furosemide (LASIX) 40 MG tablet Take 40 mg by mouth daily.  03/10/19  Yes [provider]  potassium chloride (K-DUR,KLOR-CON) 10 MEQ tablet Take 10 mEq by mouth daily.  10/11/17  Yes [provider]  sodium chloride (OCEAN) 0.65 % SOLN nasal spray Place 1 spray into both nostrils 3 (three) times daily as needed for congestion.   Yes [provider]  Respiratory Therapy Supplies (FLUTTER) DEVI Use as directed. 09/06/18   Parrett, Fonnie Mu, NP    Allergies    Codeine, Amoxicillin, Cefdinir, Doxycycline, Erythromycin, Lisinopril, Oxybutynin, and Sulfa antibiotics  Review of Systems   Review of Systems A comprehensive review of systems was completed and negative except as noted in HPI.   Physical Exam Updated Vital Signs BP 128/68   Pulse 79   Temp 98.4 F (36.9 C) (Oral)   Resp (!) 28   Ht 5\' 6"  (1.676 m)   Wt 124.7 kg   SpO2 91%   BMI 44.39 kg/m   Physical Exam Vitals and nursing note reviewed.  Constitutional:       Appearance: Normal appearance.  HENT:     Head: Normocephalic and atraumatic.     Nose: Nose normal.     Mouth/Throat:     Mouth: Mucous membranes are moist.  Eyes:     Extraocular Movements: Extraocular movements intact.     Conjunctiva/sclera: Conjunctivae normal.  Cardiovascular:     Rate and Rhythm: Normal rate.  Pulmonary:     Effort: Pulmonary effort is normal.  Breath sounds: Normal breath sounds.  Abdominal:     General: Abdomen is flat.     Palpations: Abdomen is soft.     Tenderness: There is no abdominal tenderness.  Genitourinary:    Rectum: Guaiac result positive.     Comments: Scant stool in rectum, no pain/masses, faint red stool Musculoskeletal:        General: No swelling. Normal range of motion.     Cervical back: Neck supple.  Skin:    General: Skin is warm and dry.  Neurological:     General: No focal deficit present.     Mental Status: She is alert.  Psychiatric:        Mood and Affect: Mood normal.     ED Results / Procedures / Treatments   Labs (all labs ordered are listed, but only abnormal results are displayed) Labs Reviewed  COMPREHENSIVE METABOLIC PANEL - Abnormal; Notable for the following components:      Result Value   Glucose, Bld 111 (*)    BUN 28 (*)    Creatinine, Ser 1.67 (*)    Albumin 3.2 (*)    GFR calc non Af Amer 27 (*)    GFR calc Af Amer 32 (*)    All other components within normal limits  CBC WITH DIFFERENTIAL/PLATELET - Abnormal; Notable for the following components:   RBC 2.91 (*)    Hemoglobin 10.1 (*)    HCT 26.9 (*)    MCH 34.7 (*)    MCHC 37.5 (*)    RDW 20.5 (*)    All other components within normal limits  PROTIME-INR - Abnormal; Notable for the following components:   Prothrombin Time 17.0 (*)    INR 1.4 (*)    All other components within normal limits  POC OCCULT BLOOD, ED - Abnormal; Notable for the following components:   Fecal Occult Bld POSITIVE (*)    All other components within normal limits    SARS CORONAVIRUS 2 BY RT PCR (HOSPITAL ORDER, [facility])  CBC  CBC  CBC    EKG None  Radiology No results found.  Procedures Procedures (including critical care time)  Medications Ordered in ED Medications  diltiazem (CARDIZEM CD) 24 hr capsule 180 mg (has no administration in time range)  fluticasone furoate-vilanterol (BREO ELLIPTA) 100-25 MCG/INH 1 puff (has no administration in time range)  ondansetron (ZOFRAN) tablet 4 mg (has no administration in time range)    Or  ondansetron (ZOFRAN) injection 4 mg (has no administration in time range)  albuterol (PROVENTIL) (2.5 MG/3ML) 0.083% nebulizer solution 2.5 mg (has no administration in time range)    ED Course  I have reviewed the triage vital signs and the nursing notes.  Pertinent labs & imaging results that were available during my care of the patient were reviewed by me and considered in my medical decision making (see chart for details).  Clinical Course as of Jan 02 2247  Tue Jan 02, 2020  2138 Labs reviewed, mild anemia is at baseline. CKD at baseline. Remains hemodynamically stable. Will admit for further evaluation. Hospitalist paged.    [CS]  2212 Spoke with Dr. Hal Hope who will evaluate for admission.    [CS]    Clinical Course User Index [CS] Truddie Hidden, MD   MDM Rules/Calculators/A&P                      PAtient with GI bleeding, likely lower, on Eliquis.  Hemodynamically stable now, will check labs and anticipate admission.  Final Clinical Impression(s) / ED Diagnoses Final diagnoses:  Rectal bleeding    Rx / DC Orders ED Discharge Orders    None       Truddie Hidden, MD 01/02/20 2248

## 2020-01-02 NOTE — ED Triage Notes (Signed)
Pt sts rectal bleed since yesterday after chemo tx. Received chemo Tuesday and Wednesday for lymphoma. Was constipated so took some medications and now has diarrhea with a little blood.

## 2020-01-03 ENCOUNTER — Telehealth: Payer: Self-pay | Admitting: Nurse Practitioner

## 2020-01-03 ENCOUNTER — Inpatient Hospital Stay (HOSPITAL_COMMUNITY): Payer: Medicare Other

## 2020-01-03 DIAGNOSIS — C880 Waldenstrom macroglobulinemia: Secondary | ICD-10-CM

## 2020-01-03 LAB — C DIFFICILE QUICK SCREEN W PCR REFLEX
C Diff antigen: NEGATIVE
C Diff interpretation: NOT DETECTED
C Diff toxin: NEGATIVE

## 2020-01-03 LAB — CBC
HCT: 25.6 % — ABNORMAL LOW (ref 36.0–46.0)
HCT: 25.3 % — ABNORMAL LOW (ref 36.0–46.0)
Hemoglobin: 8.8 g/dL — ABNORMAL LOW (ref 12.0–15.0)
Hemoglobin: 8.7 g/dL — ABNORMAL LOW (ref 12.0–15.0)
MCH: 31.2 pg (ref 26.0–34.0)
MCH: 30.9 pg (ref 26.0–34.0)
MCHC: 34.4 g/dL (ref 30.0–36.0)
MCHC: 34.4 g/dL (ref 30.0–36.0)
MCV: 90.8 fL (ref 80.0–100.0)
MCV: 89.7 fL (ref 80.0–100.0)
Platelets: 288 10*3/uL (ref 150–400)
Platelets: 278 10*3/uL (ref 150–400)
RBC: 2.82 MIL/uL — ABNORMAL LOW (ref 3.87–5.11)
RBC: 2.82 MIL/uL — ABNORMAL LOW (ref 3.87–5.11)
RDW: 19.4 % — ABNORMAL HIGH (ref 11.5–15.5)
RDW: 18.5 % — ABNORMAL HIGH (ref 11.5–15.5)
WBC: 4.8 10*3/uL (ref 4.0–10.5)
WBC: 4.4 10*3/uL (ref 4.0–10.5)
nRBC: 0 % (ref 0.0–0.2)
nRBC: 0 % (ref 0.0–0.2)

## 2020-01-03 LAB — SARS CORONAVIRUS 2 BY RT PCR (HOSPITAL ORDER, PERFORMED IN ~~LOC~~ HOSPITAL LAB): SARS Coronavirus 2: NEGATIVE

## 2020-01-03 MED ORDER — PANTOPRAZOLE SODIUM 40 MG IV SOLR
40.0000 mg | Freq: Every day | INTRAVENOUS | Status: DC
  Administered 2020-01-03 – 2020-01-05 (×4): 40 mg via INTRAVENOUS
  Filled 2020-01-03 (×4): qty 40

## 2020-01-03 MED ORDER — ACETAMINOPHEN 325 MG PO TABS
650.0000 mg | ORAL_TABLET | Freq: Four times a day (QID) | ORAL | Status: DC | PRN
  Administered 2020-01-03: 650 mg via ORAL
  Filled 2020-01-03: qty 2

## 2020-01-03 MED ORDER — IOHEXOL 9 MG/ML PO SOLN
500.0000 mL | ORAL | Status: AC
  Administered 2020-01-03 (×2): 500 mL via ORAL

## 2020-01-03 MED ORDER — IOHEXOL 9 MG/ML PO SOLN
ORAL | Status: AC
  Filled 2020-01-03: qty 1000

## 2020-01-03 NOTE — Progress Notes (Signed)
New Admission Note:   Arrival Method: Wheelchair  Mental Orientation: Alert and oriented x 4 Telemetry: Box 62 Assessment: Completed Skin: Dry, intact; old healed area on LLE from skin graft IV: L AC; saline locked Pain: Denies; 0/10 Tubes: None Safety Measures: Safety Fall Prevention Plan has been given, discussed with patient and daughter Admission: Completed [facility] [place] Orientation: Patient has been orientated to the room, unit and staff.  Family: Daughter at bedside  Orders have been reviewed and implemented. Will continue to monitor the patient. Call light has been placed within reach and bed alarm has been activated.   Nonie Hoyer MSN, RN Phone number: [phone]

## 2020-01-03 NOTE — ED Notes (Signed)
ED TO INPATIENT HANDOFF REPORT  ED Nurse Name and Phone #: [phone]  S Name/Age/Gender Natasha Ellis 86 y.o. female Room/Bed: WA14/WA14  Code Status   Code Status: DNR  Home/SNF/Other Home Patient oriented to: self, place, time and situation Is this baseline? Yes   Triage Complete: Triage complete  Chief Complaint Rectal bleeding [K62.5]  Triage Note Pt sts rectal bleed since yesterday after chemo tx. Received chemo Tuesday and Wednesday for lymphoma. Was constipated so took some medications and now has diarrhea with a little blood.     Allergies Allergies  Allergen Reactions  . Codeine Other (See Comments)    Crazy-nightmares  . Amoxicillin Diarrhea    Has patient had a PCN reaction causing immediate rash, facial/tongue/throat swelling, SOB or lightheadedness with hypotension:  No Has patient had a PCN reaction causing severe rash involving mucus membranes or skin necrosis: No Has patient had a PCN reaction that required hospitalization No Has patient had a PCN reaction occurring within the last 10 years: Unknown--rx is diarrhea. If all of the above answers are "NO", then may proceed with Cephalosporin use.    . Cefdinir Cough  . Doxycycline Diarrhea  . Erythromycin Other (See Comments)    GI upset  . Lisinopril Cough  . Oxybutynin Other (See Comments)    Dry mouth  . Sulfa Antibiotics Other (See Comments)    GI upset    Level of Care/Admitting Diagnosis ED Disposition    ED Disposition Condition Comment   Admit  Hospital Area: [facility] [id number] Level of Care: Telemetry [5] Admit to tele based on following criteria: Monitor for Ischemic changes Covid Evaluation: COVID negative Diagnosis: Rectal bleeding [id number] Admitting Physician: Lyna Poser [phone] Attending Physician: Rise Patience [phone] Estimated length of stay: past midnight tomorrow Certification:: I certify this patient will need inpatient services for at  least 2 midnights       B Medical/Surgery History Past Medical History:  Diagnosis Date  . Arthritis   . Asthma   . Cancer ([place])    Lymphoma  . Cataract   . Chronic cough   . Diverticulosis of intestine    H/O  . DJD (degenerative joint disease) of lumbar spine   . IBS (irritable bowel syndrome)   . Left knee DJD   . 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 Right knee DJD   . Sleep apnea    Past Surgical History:  Procedure Laterality Date  . ABDOMINAL HYSTERECTOMY  1984   TAH  . EYE SURGERY Bilateral    with lens replacement  . EYE SURGERY Left    retnia  . KNEE ARTHROSCOPY Left   . NASAL SINUS SURGERY N/A 12/04/2015   Procedure: ENDOSCOPIC SINUS SURGERY;  Surgeon: Izora Gala, MD;  Location: [facility];  Service: ENT;  Laterality: N/A;  . TEE WITHOUT CARDIOVERSION N/A 04/14/2016   Procedure: TRANSESOPHAGEAL ECHOCARDIOGRAM (TEE);  Surgeon: Lelon Perla, MD;  Location: [facility] ENDOSCOPY;  Service: Cardiovascular;  Laterality: N/A;  . TONSILLECTOMY AND ADENOIDECTOMY  childhood     A IV Location/Drains/Wounds Patient Lines/Drains/Airways Status   Active Line/Drains/Airways    Name:   Placement date:   Placement time:   Site:   Days:   Peripheral IV 01/02/20 Left Antecubital   01/02/20    2000    Antecubital   1   Incision (Closed) 12/04/15 Nose   12/04/15    1304     1491  Wound / Incision (Open or Dehisced) 01/07/19 Non-pressure wound Leg Left per patient a skin graft    01/07/19    --    Leg   361          Intake/Output Last 24 hours No intake or output data in the 24 hours ending 01/03/20 1029  Labs/Imaging Results for orders placed or performed during the hospital encounter of 01/02/20 (from the past 48 hour(s))  POC occult blood, ED     Status: Abnormal   Collection Time: 01/02/20  8:20 PM  Result Value Ref Range   Fecal Occult Bld POSITIVE (A) NEGATIVE  Comprehensive metabolic panel     Status: Abnormal   Collection Time:  01/02/20  8:30 PM  Result Value Ref Range   Sodium 138 135 - 145 mmol/L   Potassium 4.4 3.5 - 5.1 mmol/L   Chloride 100 98 - 111 mmol/L   CO2 26 22 - 32 mmol/L   Glucose, Bld 111 (H) 70 - 99 mg/dL    Comment: Glucose reference range applies only to samples taken after fasting for at least 8 hours.   BUN 28 (H) 8 - 23 mg/dL   Creatinine, Ser 1.67 (H) 0.44 - 1.00 mg/dL   Calcium 9.2 8.9 - 10.3 mg/dL   Total Protein 7.8 6.5 - 8.1 g/dL   Albumin 3.2 (L) 3.5 - 5.0 g/dL   AST 25 15 - 41 U/L   ALT 23 0 - 44 U/L   Alkaline Phosphatase 66 38 - 126 U/L   Total Bilirubin 0.9 0.3 - 1.2 mg/dL   GFR calc non Af Amer 27 (L) >60 mL/min   GFR calc Af Amer 32 (L) >60 mL/min   Anion gap 12 5 - 15    Comment: Performed at [facility], [place] [address] [place], [place] [address]  CBC with Differential     Status: Abnormal   Collection Time: 01/02/20  8:30 PM  Result Value Ref Range   WBC 4.6 4.0 - 10.5 K/uL   RBC 2.91 (L) 3.87 - 5.11 MIL/uL   Hemoglobin 10.1 (L) 12.0 - 15.0 g/dL   HCT 26.9 (L) 36.0 - 46.0 %   MCV 92.4 80.0 - 100.0 fL   MCH 34.7 (H) 26.0 - 34.0 pg   MCHC 37.5 (H) 30.0 - 36.0 g/dL   RDW 20.5 (H) 11.5 - 15.5 %   Platelets 345 150 - 400 K/uL   nRBC 0.0 0.0 - 0.2 %   Neutrophils Relative % 68 %   Neutro Abs 3.1 1.7 - 7.7 K/uL   Lymphocytes Relative 21 %   Lymphs Abs 1.0 0.7 - 4.0 K/uL   Monocytes Relative 8 %   Monocytes Absolute 0.4 0.1 - 1.0 K/uL   Eosinophils Relative 1 %   Eosinophils Absolute 0.1 0.0 - 0.5 K/uL   Basophils Relative 1 %   Basophils Absolute 0.0 0.0 - 0.1 K/uL   WBC Morphology MILD LEFT SHIFT (1-5% METAS, OCC MYELO, OCC BANDS)    Immature Granulocytes 1 %   Abs Immature Granulocytes 0.03 0.00 - 0.07 K/uL    Comment: Performed at [facility], [place] [address] [place], [place] [address]  Protime-INR     Status: Abnormal   Collection Time: 01/02/20  8:30 PM  Result Value Ref Range   Prothrombin Time 17.0 (H) 11.4 - 15.2  seconds   INR 1.4 (H) 0.8 - 1.2    Comment: (NOTE) INR goal varies based on device  and disease states. Performed at [facility], [place] [address] [place], [place] [address]   SARS Coronavirus 2 by RT PCR (hospital order, performed in [facility] hospital lab) Nasopharyngeal Nasopharyngeal Swab     Status: None   Collection Time: 01/02/20 10:14 PM   Specimen: Nasopharyngeal Swab  Result Value Ref Range   SARS Coronavirus 2 NEGATIVE NEGATIVE    Comment: (NOTE) SARS-CoV-2 target nucleic acids are NOT DETECTED. The SARS-CoV-2 RNA is generally detectable in upper and lower respiratory specimens during the acute phase of infection. The lowest concentration of SARS-CoV-2 viral copies this assay can detect is 250 copies / mL. A negative result does not preclude SARS-CoV-2 infection and should not be used as the Ellis basis for treatment or other patient management decisions.  A negative result may occur with improper specimen collection / handling, submission of specimen other than nasopharyngeal swab, presence of viral mutation(s) within the areas targeted by this assay, and inadequate number of viral copies (<250 copies / mL). A negative result must be combined with clinical observations, patient history, and epidemiological information. Fact Sheet for Patients:   StrictlyIdeas.no Fact Sheet for Healthcare Providers: BankingDealers.co.za This test is not yet approved or cleared  by the [place] FDA and has been authorized for detection and/or diagnosis of SARS-CoV-2 by FDA under an Emergency Use Authorization (EUA).  This EUA will remain in effect (meaning this test can be used) for the duration of the COVID-19 declaration under Section 564(b)(1) of the Act, 21 U.S.C. section 360bbb-3(b)(1), unless the authorization is terminated or revoked sooner. Performed at [facility], [place] [address] [place], [place] [address]   CBC     Status: Abnormal   Collection Time: 01/02/20 11:45 PM  Result Value Ref Range   WBC 4.7 4.0 - 10.5 K/uL   RBC 3.01 (L) 3.87 - 5.11 MIL/uL   Hemoglobin 9.5 (L) 12.0 - 15.0 g/dL   HCT 27.5 (L) 36.0 - 46.0 %   MCV 91.4 80.0 - 100.0 fL   MCH 31.6 26.0 - 34.0 pg   MCHC 34.5 30.0 - 36.0 g/dL   RDW 20.6 (H) 11.5 - 15.5 %   Platelets 315 150 - 400 K/uL   nRBC 0.0 0.0 - 0.2 %    Comment: Performed at [facility], [place] [address], [place], [place] [address]  CBC     Status: Abnormal   Collection Time: 01/03/20  3:35 AM  Result Value Ref Range   WBC 4.8 4.0 - 10.5 K/uL   RBC 2.82 (L) 3.87 - 5.11 MIL/uL   Hemoglobin 8.8 (L) 12.0 - 15.0 g/dL   HCT 25.6 (L) 36.0 - 46.0 %   MCV 90.8 80.0 - 100.0 fL   MCH 31.2 26.0 - 34.0 pg   MCHC 34.4 30.0 - 36.0 g/dL   RDW 19.4 (H) 11.5 - 15.5 %   Platelets 288 150 - 400 K/uL   nRBC 0.0 0.0 - 0.2 %    Comment: Performed at [facility], [place] [address] [place], [place] [address]  CBC     Status: Abnormal   Collection Time: 01/03/20  6:28 AM  Result Value Ref Range   WBC 4.4 4.0 - 10.5 K/uL   RBC 2.82 (L) 3.87 - 5.11 MIL/uL   Hemoglobin 8.7 (L) 12.0 - 15.0 g/dL   HCT 25.3 (L) 36.0 - 46.0 %   MCV 89.7 80.0 - 100.0 fL   MCH 30.9 26.0 -  34.0 pg   MCHC 34.4 30.0 - 36.0 g/dL   RDW 18.5 (H) 11.5 - 15.5 %   Platelets 278 150 - 400 K/uL   nRBC 0.0 0.0 - 0.2 %    Comment: Performed at [facility], [place] [address], [place], [place] [address]   No results found.  Pending Labs Unresulted Labs (From admission, onward)   None      Vitals/Pain Today's Vitals   01/03/20 0900 01/03/20 0930 01/03/20 0939 01/03/20 1000  BP: 112/60 116/60 116/60 117/60  Pulse: 83 76  73  Resp: (!) 26 (!) 27  (!) 29  Temp:      TempSrc:      SpO2: 99% 95%  95%  Weight:      Height:      PainSc:        Isolation Precautions No active isolations  Medications Medications   diltiazem (CARDIZEM CD) 24 hr capsule 180 mg (180 mg Oral Given 01/03/20 0939)  fluticasone furoate-vilanterol (BREO ELLIPTA) 100-25 MCG/INH 1 puff (1 puff Inhalation Not Given 01/03/20 0940)  ondansetron (ZOFRAN) tablet 4 mg (has no administration in time range)    Or  ondansetron (ZOFRAN) injection 4 mg (has no administration in time range)  albuterol (PROVENTIL) (2.5 MG/3ML) 0.083% nebulizer solution 2.5 mg (has no administration in time range)  pantoprazole (PROTONIX) injection 40 mg (40 mg Intravenous Given 01/03/20 0408)    Mobility walks with person assist High fall risk   Focused Assessments GI   R Recommendations: See Admitting Provider Note  Report given to: [facility] RN  Additional Notes:

## 2020-01-03 NOTE — Consult Note (Addendum)
[facility] Consult  Referring Provider: [facility] hospital[facility] Primary Care Physician:  Lavone Orn, MD Primary Gastroenterologist: Sadie Haber primary  Reason for Consultation: Rectal bleeding  HPI: Natasha Ellis is a 86 y.o. female with past medical history of lymphoma, Caro Laroche, currently on chemotherapy, last received on 12/26/2019 presented to the hospital with rectal bleeding.  Patient states that she normally has constipation following chemotherapy, after not having a bowel movement for over 3 days, she took senna S last week following chemotherapy, subsequently has been having multiple episodes of diarrhea, described as loose, watery, gray stools.   However, yesterday evening she noticed that the toilet bowl was filled with large amount of bright red blood. This was not associated with abdominal or rectal pain. She was advised by oncologist to proceed to the ER. Since admission patient has had about 5 bowel movements, loose and watery, however she has not noted any further rectal bleeding.  Patient believes she had her last colonoscopy in 2011 in [place] and was recommended no further colonoscopy due to age.  She had an endoscopy at the same time.  She denies recent unintentional weight loss, abdominal or rectal pain, loss of appetite, early satiety or bloating. She may have infrequent heartburn and acid reflux but denies difficulty swallowing or pain on swallowing.  She is on Eliquis for atrial fibrillation, last dose was yesterday.   Past Medical History:  Diagnosis Date  . Arthritis   . Asthma   . Cancer ([place])    Lymphoma  . Cataract   . Chronic cough   . Diverticulosis of intestine    H/O  . DJD (degenerative joint disease) of lumbar spine   . IBS (irritable bowel syndrome)   . Left knee DJD   . 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 Right knee DJD   . Sleep apnea     Past Surgical History:  Procedure  Laterality Date  . ABDOMINAL HYSTERECTOMY  1984   TAH  . EYE SURGERY Bilateral    with lens replacement  . EYE SURGERY Left    retnia  . KNEE ARTHROSCOPY Left   . NASAL SINUS SURGERY N/A 12/04/2015   Procedure: ENDOSCOPIC SINUS SURGERY;  Surgeon: Izora Gala, MD;  Location: [facility];  Service: ENT;  Laterality: N/A;  . TEE WITHOUT CARDIOVERSION N/A 04/14/2016   Procedure: TRANSESOPHAGEAL ECHOCARDIOGRAM (TEE);  Surgeon: Lelon Perla, MD;  Location: [facility] ENDOSCOPY;  Service: Cardiovascular;  Laterality: N/A;  . TONSILLECTOMY AND ADENOIDECTOMY  childhood    Prior to Admission medications   Medication Sig Start Date End Date Taking? Authorizing Provider  acetaminophen (TYLENOL) 500 MG tablet Take 1,000 mg by mouth every 6 (six) hours as needed for pain.    Yes [provider]  albuterol (PROAIR HFA) 108 (90 Base) MCG/ACT inhaler Inhale 2 puffs into the lungs every 6 (six) hours as needed for wheezing or shortness of breath. 02/14/16  Yes Parrett, Tammy S, NP  Albuterol Sulfate 2.5 MG/0.5ML NEBU Inhale 0.5 mLs (2.5 mg total) into the lungs daily. Patient taking differently: Inhale 2.5 mg into the lungs every 6 (six) hours as needed (sob/wheezing).  12/19/19  Yes Rigoberto Noel, MD  apixaban (ELIQUIS) 5 MG TABS tablet Take 1 tablet (5 mg total) by mouth 2 (two) times daily. 01/30/19  Yes Burtis Junes, NP  calcium carbonate (TUMS - DOSED IN MG ELEMENTAL CALCIUM) 500 MG chewable tablet Chew 1 tablet by mouth 2 (two) times daily  as needed for indigestion or heartburn.   Yes [provider]  calcium gluconate 500 MG tablet Take 1 tablet by mouth daily.   Yes [provider]  diltiazem (CARDIZEM CD) 180 MG 24 hr capsule Take 1 capsule (180 mg total) by mouth daily. 03/14/19 09/11/20 Yes Burtis Junes, NP  ferrous sulfate 325 (65 FE) MG tablet Take 325 mg by mouth daily.    Yes [provider]  fluticasone furoate-vilanterol (BREO ELLIPTA) 100-25 MCG/INH AEPB Inhale 1  puff into the lungs daily. 12/28/19  Yes Rigoberto Noel, MD  furosemide (LASIX) 40 MG tablet Take 40 mg by mouth daily.  03/10/19  Yes [provider]  potassium chloride (K-DUR,KLOR-CON) 10 MEQ tablet Take 10 mEq by mouth daily.  10/11/17  Yes [provider]  sodium chloride (OCEAN) 0.65 % SOLN nasal spray Place 1 spray into both nostrils 3 (three) times daily as needed for congestion.   Yes [provider]  Respiratory Therapy Supplies (FLUTTER) DEVI Use as directed. 09/06/18   Parrett, Fonnie Mu, NP    Current Facility-Administered Medications  Medication Dose Route Frequency Provider Last Rate Last Admin  . albuterol (PROVENTIL) (2.5 MG/3ML) 0.083% nebulizer solution 2.5 mg  2.5 mg Nebulization Q6H PRN Rise Patience, MD      . diltiazem (CARDIZEM CD) 24 hr capsule 180 mg  180 mg Oral Daily Rise Patience, MD   180 mg at 01/03/20 0939  . fluticasone furoate-vilanterol (BREO ELLIPTA) 100-25 MCG/INH 1 puff  1 puff Inhalation Daily Rise Patience, MD      . ondansetron [facility]) tablet 4 mg  4 mg Oral Q6H PRN Rise Patience, MD       Or  . ondansetron [facility]) injection 4 mg  4 mg Intravenous Q6H PRN Rise Patience, MD      . pantoprazole (PROTONIX) injection 40 mg  40 mg Intravenous QHS Rise Patience, MD   40 mg at 01/03/20 0408    Allergies as of 01/02/2020 - Review Complete 01/02/2020  Allergen Reaction Noted  . Codeine Other (See Comments) 06/28/2012  . Amoxicillin Diarrhea 06/19/2015  . Cefdinir Cough 08/12/2015  . Doxycycline Diarrhea 03/12/2016  . Erythromycin Other (See Comments) 06/28/2012  . Lisinopril Cough 12/14/2012  . Oxybutynin Other (See Comments) 12/14/2012  . Sulfa antibiotics Other (See Comments) 06/28/2012    Family History  Problem Relation Age of Onset  . Stroke Father   . Heart failure Mother 100  . Breast cancer Sister     Social History   Socioeconomic History  . Marital status: Widowed     Spouse name: Not on file  . Number of children: 1  . Years of education: 53  . Highest education level: Not on file  Occupational History  . Not on file  Tobacco Use  . Smoking status: Former Smoker    Packs/day: 1.00    Years: 25.00    Pack years: 25.00    Types: Cigarettes    Quit date: 08/04/1979    Years since quitting: 40.4  . Smokeless tobacco: Never Used  Substance and Sexual Activity  . Alcohol use: No  . Drug use: No  . Sexual activity: Not on file  Other Topics Concern  . Not on file  Social History Narrative   ** Merged History Encounter **       She lives in a [place] home at [facility].    Highest level of education:  [employer], business  Social Determinants of Health   Financial Resource Strain:   . Difficulty of Paying Living Expenses:   Food Insecurity:   . Worried About Charity fundraiser in the Last Year:   . Arboriculturist in the Last Year:   Transportation Needs:   . Film/video editor (Medical):   Marland Kitchen Lack of Transportation (Non-Medical):   Physical Activity:   . Days of Exercise per Week:   . Minutes of Exercise per Session:   Stress:   . Feeling of Stress :   Social Connections:   . Frequency of Communication with Friends and Family:   . Frequency of Social Gatherings with Friends and Family:   . Attends Religious Services:   . Active Member of Clubs or Organizations:   . Attends Archivist Meetings:   Marland Kitchen Marital Status:   Intimate Partner Violence:   . Fear of Current or Ex-Partner:   . Emotionally Abused:   Marland Kitchen Physically Abused:   . Sexually Abused:     Review of Systems: Positive for: GI: Described in detail in HPI.    Gen: Denies any fever, chills, rigors, night sweats, anorexia, fatigue, weakness, malaise, involuntary weight loss, and sleep disorder CV: Denies chest pain, angina, palpitations, syncope, orthopnea, PND, peripheral edema, and claudication. Resp: Denies dyspnea, cough, sputum, wheezing, coughing up  blood. GU : Denies urinary burning, blood in urine, urinary frequency, urinary hesitancy, nocturnal urination, and urinary incontinence. MS: Denies joint pain or swelling.  Denies muscle weakness, cramps, atrophy.  Derm: Denies rash, itching, oral ulcerations, hives, unhealing ulcers.  Psych: Denies depression, anxiety, memory loss, suicidal ideation, hallucinations,  and confusion. Heme:  Rectal bleeding,Denies bruising, and enlarged lymph nodes. Neuro:  Denies any headaches, dizziness, paresthesias. Endo:  Denies any problems with DM, thyroid, adrenal function.  Physical Exam: Vital signs in last 24 hours: Temp:  [98.1 F (36.7 C)-98.4 F (36.9 C)] 98.1 F (36.7 C) (06/02 1104) Pulse Rate:  [66-95] 70 (06/02 1104) Resp:  [13-36] 20 (06/02 1104) BP: (97-161)/(39-71) 120/61 (06/02 1104) SpO2:  [91 %-99 %] 96 % (06/02 1104) Weight:  [124.7 kg] 124.7 kg (06/01 1947) Last BM Date: 01/03/20  General:   Alert,  Well-developed, overweight, pleasant and cooperative in NAD Head:  Normocephalic and atraumatic. Eyes:  Sclera clear, no icterus.  Mild pallor Ears:  Normal auditory acuity. Nose:  No deformity, discharge,  or lesions. Mouth:  No deformity or lesions.  Oropharynx pink & moist. Neck:  Supple; no masses or thyromegaly. Lungs:  Clear throughout to auscultation.   No wheezes, crackles, or rhonchi. No acute distress. Heart:  Regular rate and rhythm; no murmurs, clicks, rubs,  or gallops. Extremities:  Without clubbing or edema. Neurologic:  Alert and  oriented x4;  grossly normal neurologically. Skin:  Intact without significant lesions or rashes. Psych:  Alert and cooperative. Normal mood and affect. Abdomen:  Soft, nontender and nondistended. No masses, hepatosplenomegaly or hernias noted. Normal bowel sounds, without guarding, and without rebound.         Lab Results: Recent Labs    01/02/20 2345 01/03/20 0335 01/03/20 0628  WBC 4.7 4.8 4.4  HGB 9.5* 8.8* 8.7*  HCT  27.5* 25.6* 25.3*  PLT 315 288 278   BMET Recent Labs    01/02/20 2030  NA 138  K 4.4  CL 100  CO2 26  GLUCOSE 111*  BUN 28*  CREATININE 1.67*  CALCIUM 9.2   LFT Recent Labs    01/02/20  2030  PROT 7.8  ALBUMIN 3.2*  AST 25  ALT 23  ALKPHOS 66  BILITOT 0.9   PT/INR Recent Labs    01/02/20 2030  LABPROT 17.0*  INR 1.4*    Studies/Results: No results found.  Impression: 1 episode of painless hematochezia Hemoglobin on admission was 10.1, slowly trended down to 9.5/8.8/8.7  Recent change in bowel habits, constipation followed by diarrhea On Eliquis for atrial fibrillation  Other comorbidities include lymphoma, sleep apnea, history of asthma/bronchiectasis, chronic kidney disease  Plan: I discussed with patient about diagnostic approaches for rectal bleeding including colonoscopy vs CAT scan vs or complete conservative management(wait and watch, monitor hemoglobin and transfuse if needed).  She is not willing to undergo a diagnostic colonoscopy- due to her age, the need to drink colonic prep and need for anesthesia.  Patient is aware that it will be difficult to give clearance to resume Eliquis without being able to determine the cause of rectal bleeding.  She would want to proceed with a CAT scan, as we discussed about possibilities of diverticulosis less likely a colonic lesion or colitis.  I would also recommend sending stool for C. difficile and GI pathogen panel to rule out any infectious etiology.   LOS: 1 day   Ronnette Juniper, MD  01/03/2020, 12:50 PM

## 2020-01-03 NOTE — Progress Notes (Signed)
Patient ID: Natasha Ellis, female   DOB: 01/20/1931, 86 y.o.   MRN: [id number]  PROGRESS NOTE    MALAHNI SKIBINSKI  [id number] DOB: 04/13/1931 DOA: 01/02/2020 PCP: Lavone Orn, MD   Brief Narrative:  86 year old female with history of lymphoma and Waldenstrm macroglobulinemia who had received rituximab on Dec 26, 2019 and subsequently had constipation following which she had taken laxatives leading to diarrhea and then subsequently bloody bowel movement.  In the ED, hemoglobin was around baseline at 10.1.  Covid testing was negative.  Assessment & Plan:   Acute GI bleeding: Most likely lower GI bleeding -Apixaban on hold.  Monitor H&H.  Transfuse if hemoglobin is less than 7 or patient is actively bleeding and is hemodynamically unstable. -I have consulted GI.  Follow recommendations.  Continue Protonix.  Paroxysmal A. Fib -Eliquis held.  Currently rate controlled.  Continue Cardizem.  History of lymphoma and Waldenstrm macroglobulinemia -Outpatient follow-up with oncology/Dr. Ammie Dalton.  Received immunotherapy last week  History of chronic shortness of breath and bronchiectasis of lung nodules: Outpatient follow-up with pulmonary  Generalized deconditioning -PT eval  DVT prophylaxis: SCDs Code Status: Full Family Communication: Spoke to patient at bedside Disposition Plan: Status is: Inpatient  Remains inpatient appropriate because: Patient needs inpatient H&H monitoring and GI consultation.   Dispo: The patient is from: independent living facility              Anticipated d/c is to: Independent living facility              Anticipated d/c date is: 2 days              Patient currently is not medically stable to d/c.  Consultants: GI  Procedures: None  Antimicrobials: None   Subjective: Patient seen and examined at bedside.  He is a poor historian.  Denies any rectal bleeding since admission.  Having some diarrhea.  No overnight fever or vomiting  reported.  Objective: Vitals:   01/03/20 0930 01/03/20 0939 01/03/20 1000 01/03/20 1104  BP: 116/60 116/60 117/60 120/61  Pulse: 76  73 70  Resp: (!) 27  (!) 29 20  Temp:    98.1 F (36.7 C)  TempSrc:    Oral  SpO2: 95%  95% 96%  Weight:      Height:       No intake or output data in the 24 hours ending 01/03/20 1152 Filed Weights   01/02/20 1947  Weight: 124.7 kg    Examination:  General exam: Appears calm and comfortable.  Chronically ill looking elderly female lying in bed. Respiratory system: Bilateral decreased breath sounds at bases.  Intermittently tachypneic Cardiovascular system: S1 & S2 heard, Rate controlled Gastrointestinal system: Abdomen is nondistended, soft and nontender. Normal bowel sounds heard. Extremities: No cyanosis, clubbing, edema  Central nervous system: Awake, poor historian. No focal neurological deficits. Moving extremities Skin: No rashes, lesions or ulcers Psychiatry: Slightly anxious    Data Reviewed: I have personally reviewed following labs and imaging studies  CBC: Recent Labs  Lab 01/02/20 2030 01/02/20 2345 01/03/20 0335 01/03/20 0628  WBC 4.6 4.7 4.8 4.4  NEUTROABS 3.1  --   --   --   HGB 10.1* 9.5* 8.8* 8.7*  HCT 26.9* 27.5* 25.6* 25.3*  MCV 92.4 91.4 90.8 89.7  PLT 345 315 288 0000000   Basic Metabolic Panel: Recent Labs  Lab 01/02/20 2030  NA 138  K 4.4  CL 100  CO2 26  GLUCOSE 111*  BUN  28*  CREATININE 1.67*  CALCIUM 9.2   GFR: Estimated Creatinine Clearance: 32.6 mL/min (A) (by C-G formula based on SCr of 1.67 mg/dL (H)). Liver Function Tests: Recent Labs  Lab 01/02/20 2030  AST 25  ALT 23  ALKPHOS 66  BILITOT 0.9  PROT 7.8  ALBUMIN 3.2*   No results for input(s): LIPASE, AMYLASE in the last 168 hours. No results for input(s): AMMONIA in the last 168 hours. Coagulation Profile: Recent Labs  Lab 01/02/20 2030  INR 1.4*   Cardiac Enzymes: No results for input(s): CKTOTAL, CKMB, CKMBINDEX,  TROPONINI in the last 168 hours. BNP (last 3 results) Recent Labs    03/14/19 1210 09/13/19 0938  PROBNP 445 133.0*   HbA1C: No results for input(s): HGBA1C in the last 72 hours. CBG: No results for input(s): GLUCAP in the last 168 hours. Lipid Profile: No results for input(s): CHOL, HDL, LDLCALC, TRIG, CHOLHDL, LDLDIRECT in the last 72 hours. Thyroid Function Tests: No results for input(s): TSH, T4TOTAL, FREET4, T3FREE, THYROIDAB in the last 72 hours. Anemia Panel: No results for input(s): VITAMINB12, FOLATE, FERRITIN, TIBC, IRON, RETICCTPCT in the last 72 hours. Sepsis Labs: No results for input(s): PROCALCITON, LATICACIDVEN in the last 168 hours.  Recent Results (from the past 240 hour(s))  SARS Coronavirus 2 by RT PCR (hospital order, performed in [facility] hospital lab) Nasopharyngeal Nasopharyngeal Swab     Status: None   Collection Time: 01/02/20 10:14 PM   Specimen: Nasopharyngeal Swab  Result Value Ref Range Status   SARS Coronavirus 2 NEGATIVE NEGATIVE Final    Comment: (NOTE) SARS-CoV-2 target nucleic acids are NOT DETECTED. The SARS-CoV-2 RNA is generally detectable in upper and lower respiratory specimens during the acute phase of infection. The lowest concentration of SARS-CoV-2 viral copies this assay can detect is 250 copies / mL. A negative result does not preclude SARS-CoV-2 infection and should not be used as the sole basis for treatment or other patient management decisions.  A negative result may occur with improper specimen collection / handling, submission of specimen other than nasopharyngeal swab, presence of viral mutation(s) within the areas targeted by this assay, and inadequate number of viral copies (<250 copies / mL). A negative result must be combined with clinical observations, patient history, and epidemiological information. Fact Sheet for Patients:   StrictlyIdeas.no Fact Sheet for Healthcare  Providers: BankingDealers.co.za This test is not yet approved or cleared  by the [place] FDA and has been authorized for detection and/or diagnosis of SARS-CoV-2 by FDA under an Emergency Use Authorization (EUA).  This EUA will remain in effect (meaning this test can be used) for the duration of the COVID-19 declaration under Section 564(b)(1) of the Act, 21 U.S.C. section 360bbb-3(b)(1), unless the authorization is terminated or revoked sooner. Performed at [facility], [place] [address], [place], [place] [address]          Radiology Studies: No results found.      Scheduled Meds: . diltiazem  180 mg Oral Daily  . fluticasone furoate-vilanterol  1 puff Inhalation Daily  . pantoprazole (PROTONIX) IV  40 mg Intravenous QHS   Continuous Infusions:        Aline August, MD [facility] 01/03/2020, 11:52 AM

## 2020-01-03 NOTE — Telephone Encounter (Signed)
Scheduled per los. Called and left meg. Mailed printout

## 2020-01-04 DIAGNOSIS — K5792 Diverticulitis of intestine, part unspecified, without perforation or abscess without bleeding: Secondary | ICD-10-CM

## 2020-01-04 DIAGNOSIS — N1831 Chronic kidney disease, stage 3a: Secondary | ICD-10-CM

## 2020-01-04 LAB — BASIC METABOLIC PANEL
Anion gap: 8 (ref 5–15)
BUN: 22 mg/dL (ref 8–23)
CO2: 25 mmol/L (ref 22–32)
Calcium: 8.8 mg/dL — ABNORMAL LOW (ref 8.9–10.3)
Chloride: 101 mmol/L (ref 98–111)
Creatinine, Ser: 1.48 mg/dL — ABNORMAL HIGH (ref 0.44–1.00)
GFR calc Af Amer: 37 mL/min — ABNORMAL LOW (ref >60)
GFR calc non Af Amer: 32 mL/min — ABNORMAL LOW (ref >60)
Glucose, Bld: 116 mg/dL — ABNORMAL HIGH (ref 70–99)
Potassium: 4.1 mmol/L (ref 3.5–5.1)
Sodium: 134 mmol/L — ABNORMAL LOW (ref 135–145)

## 2020-01-04 LAB — CBC WITH DIFFERENTIAL/PLATELET
Abs Immature Granulocytes: 0.02 10*3/uL (ref 0.00–0.07)
Basophils Absolute: 0 10*3/uL (ref 0.0–0.1)
Basophils Relative: 1 %
Eosinophils Absolute: 0.1 10*3/uL (ref 0.0–0.5)
Eosinophils Relative: 2 %
HCT: 29 % — ABNORMAL LOW (ref 36.0–46.0)
Hemoglobin: 9.6 g/dL — ABNORMAL LOW (ref 12.0–15.0)
Immature Granulocytes: 0 %
Lymphocytes Relative: 29 %
Lymphs Abs: 1.5 10*3/uL (ref 0.7–4.0)
MCH: 29.6 pg (ref 26.0–34.0)
MCHC: 33.1 g/dL (ref 30.0–36.0)
MCV: 89.5 fL (ref 80.0–100.0)
Monocytes Absolute: 0.5 10*3/uL (ref 0.1–1.0)
Monocytes Relative: 10 %
Neutro Abs: 3.2 10*3/uL (ref 1.7–7.7)
Neutrophils Relative %: 58 %
Platelets: 312 10*3/uL (ref 150–400)
RBC: 3.24 MIL/uL — ABNORMAL LOW (ref 3.87–5.11)
RDW: 17.8 % — ABNORMAL HIGH (ref 11.5–15.5)
WBC: 5.3 10*3/uL (ref 4.0–10.5)
nRBC: 0 % (ref 0.0–0.2)

## 2020-01-04 LAB — GASTROINTESTINAL PANEL BY PCR, STOOL (REPLACES STOOL CULTURE)

## 2020-01-04 LAB — MAGNESIUM: Magnesium: 2.1 mg/dL (ref 1.7–2.4)

## 2020-01-04 MED ORDER — METRONIDAZOLE IN NACL 5-0.79 MG/ML-% IV SOLN
500.0000 mg | Freq: Three times a day (TID) | INTRAVENOUS | Status: DC
  Administered 2020-01-04 – 2020-01-05 (×6): 500 mg via INTRAVENOUS
  Filled 2020-01-04 (×7): qty 100

## 2020-01-04 MED ORDER — SODIUM CHLORIDE 0.9 % IV SOLN
2.0000 g | INTRAVENOUS | Status: DC
  Administered 2020-01-04 – 2020-01-05 (×2): 2 g via INTRAVENOUS
  Filled 2020-01-04 (×2): qty 2
  Filled 2020-01-04: qty 20

## 2020-01-04 NOTE — TOC Initial Note (Signed)
Transition of Care [facility]) - Initial/Assessment Note    Patient Details  Name: Natasha Ellis MRN: [id number] Date of Birth: Dec 19, 1930  Transition of Care [facility]) CM/SW Contact:    Trish Mage, LCSW Phone Number: 01/04/2020, 4:57 PM  Clinical Narrative:   Patient seen in follow up to PT recommendation of SNF.  [name] was seen in her room with her daughter at bedside, and she gave permission to speak with daughter there.  [name] has been living at [facility] for 8 years.  She is open to going to SNF, but is adamant that it be [facility].  I called [facility], who informed me they have no beds, but she had Anderson Malta call [name] to let her know about cost for having someone in the home with her 24/7.  When I circled back to see [name] this afternoon, she was flustered and unwilling to talk further about the plan, stating she needed "to think about things overnight before making a decision."   I did not initiate a bed search nor get a PASSR number since she may opt to return home with care. TOC will continue to follow during the course of hospitalization.                Expected Discharge Plan: [place] Barriers to Discharge: SNF Pending bed offer   Patient Goals and CMS Choice Patient states their goals for this hospitalization and ongoing recovery are:: "I want to go to rehab at [facility]. It's where I live."      Expected Discharge Plan and Services Expected Discharge Plan: [place] Choice: [place] arrangements for the past 2 months: Apartment                                      Prior Living Arrangements/Services Living arrangements for the past 2 months: Apartment Lives with:: Self Patient language and need for interpreter reviewed:: Yes        Need for Family Participation in Patient Care: Yes (Comment) Care giver support system in place?: Yes  (comment) Current home services: DME Criminal Activity/Legal Involvement Pertinent to Current Situation/Hospitalization: No - Comment as needed  Activities of Daily Living Home Assistive Devices/Equipment: Cane (specify quad or straight), Hearing aid, Wheelchair ADL Screening (condition at time of admission) Patient's cognitive ability adequate to safely complete daily activities?: Yes Is the patient deaf or have difficulty hearing?: Yes Does the patient have difficulty seeing, even when wearing glasses/contacts?: No Does the patient have difficulty concentrating, remembering, or making decisions?: No Patient able to express need for assistance with ADLs?: Yes Does the patient have difficulty dressing or bathing?: No Independently performs ADLs?: Yes (appropriate for developmental age) Does the patient have difficulty walking or climbing stairs?: No Weakness of Legs: None Weakness of Arms/Hands: None  Permission Sought/Granted Permission sought to share information with : Family Supports Permission granted to share information with : Yes, Verbal Permission Granted  Share Information with NAME: Natasha Ellis     Permission granted to share info w Relationship: daughter  Permission granted to share info w Contact Information: [phone]  Emotional Assessment Appearance:: Appears stated age Attitude/Demeanor/Rapport: Engaged Affect (typically observed): Appropriate Orientation: : Oriented to Self, Oriented to Place, Oriented to  Time, Oriented to Situation Alcohol / Substance Use: Not Applicable Psych Involvement: No (  comment)  Admission diagnosis:  Rectal bleeding [K62.5] Patient Active Problem List   Diagnosis Date Noted  . Rectal bleeding 01/02/2020  . PAF (paroxysmal atrial fibrillation) ([place]) 01/02/2020  . Goals of care, counseling/discussion 11/14/2019  . CKD (chronic kidney disease) stage 3, GFR 30-59 ml/min 01/08/2019  . Physical deconditioning 09/15/2018  .  Bronchiectasis ([place]) 09/06/2018  . Lung nodule 09/06/2018  . Ascending aortic aneurysm ([place]) 09/06/2018  . Hypoxia 08/15/2016  . Severe obesity (BMI >= 40) ([place]) 07/30/2015  . Recurrent pneumonia 06/19/2015  . Anemia 06/10/2015  . Anemia due to other cause 06/10/2015  . OSA (obstructive sleep apnea) 09/20/2014  . History of lymphoma 09/20/2014  . History of TIA (transient ischemic attack) 09/20/2014  . Diarrhea   . Pain in gums 08/15/2014  . Neutropenia ([place]) 08/15/2014  . Hypersensitivity reaction 05/31/2014  . Waldenstrom's macroglobulinemia ([place]) 04/19/2014  . HTN (hypertension) 04/29/2013  . HLD (hyperlipidemia) 04/29/2013  . Lacunar infarction ([place]) 04/29/2013  . GERD (gastroesophageal reflux disease) 04/29/2013  . TIA (transient ischemic attack) 04/29/2013  . Back pain 04/29/2013  . Asthma 04/29/2013  . Dyspnea 01/25/2013  . Chronic cough 01/01/2013   PCP:  Lavone Orn, MD Pharmacy:   [place], [place] [address] Phone: [phone] Fax: [place], [place] [facility] [address] [place] [address] [place] [address] Phone: [phone] Fax: [place], [place] [address] [place] [address] Phone: [phone] Fax: [place], [place] [address] [place] [place] [address] Phone: [phone] Fax: [phone]     Social Determinants of Health (SDOH) Interventions    Readmission Risk Interventions Readmission Risk Prevention Plan 01/09/2019 01/09/2019  Transportation Screening Complete Complete  PCP or Specialist Appt within 3-5 Days Not Complete Not Complete  Not Complete comments not ready for d/c -  HRI or Home Care Consult Complete -  Social Work Consult for [place]  Planning/Counseling Complete -  Palliative Care Screening Not Applicable -  Medication Review (RN Care Manager) Complete -  Some recent data might be hidden

## 2020-01-04 NOTE — Progress Notes (Signed)
Physical Therapy Evaluation Patient Details Name: Natasha Ellis MRN: [id number] DOB: [birth year] Today's Date: 01/04/2020  Clinical Impression Natasha Ellis is 86 y.o. female admitted with above HPI and diagnosis. Patient is currently limited by functional impairments below (see PT problem list). Patient lives alone at [place] and reports modified independence with rollator at baseline. Patient's daughter reports pt has become weaker gradually and is significantly limited by weakness with mobilizing in home. Patient will benefit from continued skilled PT interventions to address impairments and progress independence with mobility, recommending SNF level therapy and 24/7 assist. Acute PT will follow and progress as able.    01/04/20 1000  PT Visit Information  Last PT Received On 01/04/20  Assistance Needed +1  History of Present Illness Patient is 86 year old female with history of lymphoma and Waldenstrm macroglobulinemia who had received rituximab on Dec 26, 2019 and subsequently had constipation following which she had taken laxatives leading to diarrhea and then subsequently bloody bowel movement. Patient admitted for concerns of acute GI bleed.  Precautions  Precautions Fall  Restrictions  Weight Bearing Restrictions No  Home Living  Family/patient expects to be discharged to: Other (Comment) (ILF ([facility]))  Living Arrangements Alone  Available Help at Discharge Family (limited - daughter expresses she is not able to stay or pt)  Type of [place] Access Level entry;Stairs to enter  Entrance Stairs-Number of Steps 1 step at back doorway; level entry at front  Entrance Stairs-Rails None  Home Layout One level  [place] - 4 wheels;BSC;Shower seat - built in (shower seat is low, BSC over toilet to make taller)  Additional Comments pt's daughter stating "nodbody can help her physicaly" regarding her mother's  weakness and mobility limitations  Ellis Function  Level of Independence Needs assistance;Independent with assistive device(s)  Gait / Transfers Assistance Needed pt reports using rollator for mobility in home (daughter reprts pt does not move around home and is sedentary) pt has a lift chair that she uses to get up/down.   Comments pt/daughter report she is thinking about getting an aid  Communication  Communication No difficulties  Pain Assessment  Pain Assessment No/denies pain  Cognition  Arousal/Alertness Awake/alert  Behavior During Therapy WFL for tasks assessed/performed  Overall Cognitive Status Within Functional Limits for tasks assessed  Upper Extremity Assessment  Upper Extremity Assessment Generalized weakness;Defer to OT evaluation  Lower Extremity Assessment  Lower Extremity Assessment Generalized weakness  Cervical / Trunk Assessment  Cervical / Trunk Assessment Kyphotic  Bed Mobility  General bed mobility comments pt OOB in recliner at start  Transfers  Overall transfer level Needs assistance  Equipment used Rolling walker (2 wheeled)  Transfers Sit to/from Stand  Sit to Stand +2 physical assistance;Mod assist;Min assist;+2 safety/equipment  General transfer comment Mod assist 1x for sit to stand with 1 person (PT). pt requried min assist +2 for sit<>stand ons econd attempt. Each time pt requries significant amount of momentum to initiate power up and cues needed for safety with reach back to control lowering to chair.  Ambulation/Gait  Ambulation/Gait assistance Mod assist;+2 safety/equipment  Gait Distance (Feet) 7 Feet  Assistive device Rolling walker (2 wheeled)  Gait Pattern/deviations Step-to pattern;Decreased stride length;Decreased step length - left;Decreased step length - right;Trunk flexed  General Gait Details cues for safe proximity to RW and cues for posture throughout. pt able to take several small steps in room, flexed posture requirign repeated cues  to  improve upright posture. pt limited by fatigue.  Gait velocity decreased  Gait velocity interpretation <1.31 ft/sec, indicative of household ambulator  Balance  Overall balance assessment Needs assistance  Sitting-balance support Feet supported  Sitting balance-Leahy Scale Fair  Standing balance support During functional activity;Bilateral upper extremity supported  Standing balance-Leahy Scale Poor  Standing balance comment heavily reliant on external support, HIGH FALL RISK  PT - End of Session  Equipment Utilized During Treatment Gait belt  Activity Tolerance Patient limited by fatigue  Patient left in chair;with chair alarm set;with family/visitor present;with call bell/phone within reach  Nurse Communication Mobility status  PT Assessment  PT Recommendation/Assessment Patient needs continued PT services  PT Visit Diagnosis Unsteadiness on feet (R26.81);Muscle weakness (generalized) (M62.81);Difficulty in walking, not elsewhere classified (R26.2)  PT Problem List Decreased strength;Decreased activity tolerance;Decreased balance;Decreased mobility;Decreased knowledge of use of DME;Decreased safety awareness;Obesity  Barriers to Discharge Decreased caregiver support  Barriers to Discharge Comments pt's duaghter reports she cannot provide physical assistance to her mother  PT Plan  PT Frequency (ACUTE ONLY) Min 3X/week  PT Treatment/Interventions (ACUTE ONLY) DME instruction;Gait training;Functional mobility training;Therapeutic activities;Therapeutic exercise;Balance training;Patient/family education  AM-PAC PT "6 Clicks" Mobility Outcome Measure (Version 2)  Help needed turning from your back to your side while in a flat bed without using bedrails? 2  Help needed moving from lying on your back to sitting on the side of a flat bed without using bedrails? 2  Help needed moving to and from a bed to a chair (including a wheelchair)? 2  Help needed standing up from a chair using your  arms (e.g., wheelchair or bedside chair)? 2  Help needed to walk in hospital room? 2  Help needed climbing 3-5 steps with a railing?  1  6 Click Score 11  Consider Recommendation of Discharge To: CIR/SNF/LTACH  PT Recommendation  Recommendations for Other Services OT consult  Follow Up Recommendations SNF;Supervision/Assistance - 24 hour  PT equipment None recommended by PT  Individuals Consulted  Consulted and Agree with Results and Recommendations Patient;Family member/caregiver  Family Member Consulted pt's daughter  Acute Rehab PT Goals  Patient Stated Goal be able to get home  PT Goal Formulation With patient  Time For Goal Achievement 01/18/20  Potential to Achieve Goals Fair  PT Time Calculation  PT Start Time (ACUTE ONLY) 1039  PT Stop Time (ACUTE ONLY) 1114  PT Time Calculation (min) (ACUTE ONLY) 35 min  PT General Charges  $$ ACUTE PT VISIT 1 Visit  PT Evaluation  $PT Eval Moderate Complexity 1 Mod  PT Treatments  $Gait Training 8-22 mins  Written Expression  Dominant Hand Right    Verner Mould, DPT Physical Therapist with [facility] [phone]  01/04/2020 5:21 PM

## 2020-01-04 NOTE — Plan of Care (Signed)
  Problem: Education: Goal: Knowledge of General Education information will improve Description: Including pain rating scale, medication(s)/side effects and non-pharmacologic comfort measures Outcome: Progressing   Problem: Health Behavior/Discharge Planning: Goal: Ability to manage health-related needs will improve Outcome: Progressing   Problem: Clinical Measurements: Goal: Ability to maintain clinical measurements within normal limits will improve Outcome: Progressing Goal: Will remain free from infection Outcome: Progressing Goal: Diagnostic test results will improve Outcome: Progressing Goal: Respiratory complications will improve Outcome: Progressing Goal: Cardiovascular complication will be avoided Outcome: Progressing   Problem: Activity: Goal: Risk for activity intolerance will decrease Outcome: Progressing   Problem: Nutrition: Goal: Adequate nutrition will be maintained Outcome: Progressing   Problem: Coping: Goal: Level of anxiety will decrease Outcome: Progressing   Problem: Elimination: Goal: Will not experience complications related to bowel motility Outcome: Progressing Goal: Will not experience complications related to urinary retention Outcome: Progressing   Problem: Safety: Goal: Ability to remain free from injury will improve Outcome: Progressing   Problem: Pain Managment: Goal: General experience of comfort will improve Outcome: Progressing   Problem: Skin Integrity: Goal: Risk for impaired skin integrity will decrease Outcome: Progressing   Problem: Education: Goal: Ability to identify signs and symptoms of gastrointestinal bleeding will improve Outcome: Progressing   Problem: Bowel/Gastric: Goal: Will show no signs and symptoms of gastrointestinal bleeding Outcome: Progressing   Problem: Fluid Volume: Goal: Will show no signs and symptoms of excessive bleeding Outcome: Progressing   Problem: Clinical Measurements: Goal:  Complications related to the disease process, condition or treatment will be avoided or minimized Outcome: Progressing

## 2020-01-04 NOTE — Progress Notes (Signed)
Patient ID: Natasha Ellis, female   DOB: 19-Dec-1930, 86 y.o.   MRN: [id number]  PROGRESS NOTE    Natasha Ellis  [id number] DOB: 1930-10-10 DOA: 01/02/2020 PCP: Lavone Orn, MD   Brief Narrative:  86 year old female with history of lymphoma and Waldenstrm macroglobulinemia who had received rituximab on Dec 26, 2019 and subsequently had constipation following which she had taken laxatives leading to diarrhea and then subsequently bloody bowel movement.  In the ED, hemoglobin was around baseline at 10.1.  Covid testing was negative.  Assessment & Plan:   Acute GI bleeding: Most likely lower GI bleeding Probable acute diverticulitis -Apixaban on hold.  Monitor H&H.  Transfuse if hemoglobin is less than 7 or patient is actively bleeding and is hemodynamically unstable. -Hemoglobin 9.6 today -GI evaluation appreciated. CT of the abdomen and pelvis done on 01/03/2020 for sigmoid diverticulosis with probable active diverticulitis. Will start Rocephin and Flagyl. Stool for C. difficile negative. GI PCR pending. -will advance diet.  Paroxysmal A. Fib -Eliquis held.  Currently rate controlled.  Continue Cardizem.  History of lymphoma and Waldenstrm macroglobulinemia -Outpatient follow-up with oncology/Dr. Ammie Dalton.  Received immunotherapy last week  History of chronic shortness of breath and bronchiectasis of lung nodules: Outpatient follow-up with pulmonary  Generalized deconditioning -PT eval  DVT prophylaxis: SCDs Code Status: DNR  family Communication: Spoke to patient at bedside Disposition Plan: Status is: Inpatient  Remains inpatient appropriate because: Patient needs intravenous antibiotics for diverticulitis because of persistent diarrhea.  Dispo: The patient is from: independent living facility              Anticipated d/c is to: Independent living facility              Anticipated d/c date is: 1 to 2 days              Patient currently is not medically stable to  d/c.  Consultants: GI  Procedures: None  Antimicrobials: None   Subjective: Patient seen and examined at bedside. Poor historian. Still complains of intermittent diarrhea but no overnight rectal bleeding.  Wants to advance diet.  No worsening fever or shortness of breath reported. Objective: Vitals:   01/03/20 1405 01/03/20 1949 01/03/20 2312 01/04/20 0506  BP: (!) 126/54 (!) 112/46 126/75 128/65  Pulse: 65 72 75 97  Resp: 20 20 (!) 22 18  Temp: 98.7 F (37.1 C) 97.7 F (36.5 C) 99.1 F (37.3 C) 99.3 F (37.4 C)  TempSrc: Oral     SpO2: 97% 98% 93% 96%  Weight:      Height:        Intake/Output Summary (Last 24 hours) at 01/04/2020 0800 Last data filed at 01/04/2020 0500 Gross per 24 hour  Intake 450 ml  Output 875 ml  Net -425 ml   Filed Weights   01/02/20 1947  Weight: 124.7 kg    Examination:  General exam: Appears calm and comfortable.  Chronically ill looking elderly female lying in bed. No acute distress. Respiratory system: Bilateral decreased breath sounds at bases with some scattered crackles.  Cardiovascular system: Rate controlled, S1-S2 heard Gastrointestinal system: Abdomen is nondistended, soft and nontender. Bowel sounds are heard  extremities: Trace lower extremity edema. No clubbing Central nervous system: Awake, poor historian. No focal neurological deficits. Moves extremities. Skin: No obvious lesions or ulcers  psychiatry: Looks anxious   Data Reviewed: I have personally reviewed following labs and imaging studies  CBC: Recent Labs  Lab 01/02/20 2030 01/02/20 2345 01/03/20 0335  01/03/20 0628 01/04/20 0530  WBC 4.6 4.7 4.8 4.4 5.3  NEUTROABS 3.1  --   --   --  3.2  HGB 10.1* 9.5* 8.8* 8.7* 9.6*  HCT 26.9* 27.5* 25.6* 25.3* 29.0*  MCV 92.4 91.4 90.8 89.7 89.5  PLT 345 315 288 278 [id number]   Basic Metabolic Panel: Recent Labs  Lab 01/02/20 2030 01/04/20 0530  NA 138 134*  K 4.4 4.1  CL 100 101  CO2 26 25  GLUCOSE 111* 116*  BUN  28* 22  CREATININE 1.67* 1.48*  CALCIUM 9.2 8.8*  MG  --  2.1   GFR: Estimated Creatinine Clearance: 36.8 mL/min (A) (by C-G formula based on SCr of 1.48 mg/dL (H)). Liver Function Tests: Recent Labs  Lab 01/02/20 2030  AST 25  ALT 23  ALKPHOS 66  BILITOT 0.9  PROT 7.8  ALBUMIN 3.2*   No results for input(s): LIPASE, AMYLASE in the last 168 hours. No results for input(s): AMMONIA in the last 168 hours. Coagulation Profile: Recent Labs  Lab 01/02/20 2030  INR 1.4*   Cardiac Enzymes: No results for input(s): CKTOTAL, CKMB, CKMBINDEX, TROPONINI in the last 168 hours. BNP (last 3 results) Recent Labs    03/14/19 1210 09/13/19 0938  PROBNP 445 133.0*   HbA1C: No results for input(s): HGBA1C in the last 72 hours. CBG: No results for input(s): GLUCAP in the last 168 hours. Lipid Profile: No results for input(s): CHOL, HDL, LDLCALC, TRIG, CHOLHDL, LDLDIRECT in the last 72 hours. Thyroid Function Tests: No results for input(s): TSH, T4TOTAL, FREET4, T3FREE, THYROIDAB in the last 72 hours. Anemia Panel: No results for input(s): VITAMINB12, FOLATE, FERRITIN, TIBC, IRON, RETICCTPCT in the last 72 hours. Sepsis Labs: No results for input(s): PROCALCITON, LATICACIDVEN in the last 168 hours.  Recent Results (from the past 240 hour(s))  SARS Coronavirus 2 by RT PCR (hospital order, performed in [facility] hospital lab) Nasopharyngeal Nasopharyngeal Swab     Status: None   Collection Time: 01/02/20 10:14 PM   Specimen: Nasopharyngeal Swab  Result Value Ref Range Status   SARS Coronavirus 2 NEGATIVE NEGATIVE Final    Comment: (NOTE) SARS-CoV-2 target nucleic acids are NOT DETECTED. The SARS-CoV-2 RNA is generally detectable in upper and lower respiratory specimens during the acute phase of infection. The lowest concentration of SARS-CoV-2 viral copies this assay can detect is 250 copies / mL. A negative result does not preclude SARS-CoV-2 infection and should not be used as  the sole basis for treatment or other patient management decisions.  A negative result may occur with improper specimen collection / handling, submission of specimen other than nasopharyngeal swab, presence of viral mutation(s) within the areas targeted by this assay, and inadequate number of viral copies (<250 copies / mL). A negative result must be combined with clinical observations, patient history, and epidemiological information. Fact Sheet for Patients:   StrictlyIdeas.no Fact Sheet for Healthcare Providers: BankingDealers.co.za This test is not yet approved or cleared  by the [place] FDA and has been authorized for detection and/or diagnosis of SARS-CoV-2 by FDA under an Emergency Use Authorization (EUA).  This EUA will remain in effect (meaning this test can be used) for the duration of the COVID-19 declaration under Section 564(b)(1) of the Act, 21 U.S.C. section 360bbb-3(b)(1), unless the authorization is terminated or revoked sooner. Performed at [facility], [place] [address] [place], [place] [address]   C Difficile Quick Screen w PCR reflex     Status: None  Collection Time: 01/03/20  6:08 PM   Specimen: STOOL  Result Value Ref Range Status   C Diff antigen NEGATIVE NEGATIVE Final   C Diff toxin NEGATIVE NEGATIVE Final   C Diff interpretation No C. difficile detected.  Final    Comment: Performed at [facility], [place] [address], [place], [place] [address]         Radiology Studies: CT ABDOMEN PELVIS WO CONTRAST  Result Date: 01/03/2020 CLINICAL DATA:  Rectal bleeding EXAM: CT ABDOMEN AND PELVIS WITHOUT CONTRAST TECHNIQUE: Multidetector CT imaging of the abdomen and pelvis was performed following the standard protocol without IV contrast. COMPARISON:  01/08/2016 FINDINGS: Lower chest: There is bronchiectasis and peribronchial thickening in the lower lobes. Bibasilar scarring.  There is a nodule in the left lung base medially adjacent to the left hemidiaphragm measuring 13 mm on image 12. This previously measured 5 mm. Hepatobiliary: Multiple gallstones within the gallbladder. No focal hepatic abnormality. Pancreas: No focal abnormality or ductal dilatation. Spleen: No focal abnormality.  Normal size. Adrenals/Urinary Tract: Kidneys appear atrophic, left worse than right. Fat containing lesion within the left adrenal gland measures 2.5 cm and is stable most compatible with myelolipoma. No hydronephrosis. Urinary bladder unremarkable. Stomach/Bowel: Sigmoid diverticulosis. Inflammatory stranding around the sigmoid colon compatible with active diverticulitis. Stomach and small bowel decompressed, unremarkable. Vascular/Lymphatic: Heavily calcified aorta. No aneurysm. Enlarged right inguinal lymph node with a short axis diameter measuring 3.3 cm. Reproductive: Prior hysterectomy.  No adnexal masses. Other: No free fluid or free air. Musculoskeletal: Compression fracture at L3 appears chronic but is new since 2017. Diffuse degenerative changes. IMPRESSION: Sigmoid diverticulosis with inflammatory stranding around the proximal sigmoid colon compatible with active diverticulitis. Right inguinal adenopathy. Cannot exclude lymphoproliferative disorder. Cholelithiasis. Aortic atherosclerosis. Bronchiectasis and bronchial wall thickening in the lower lobes bilaterally. 13 mm left lower lobe nodule, enlarging since prior study. Consider one of the following in 3 months for both low-risk and high-risk individuals: (a) repeat chest CT, (b) follow-up PET-CT, or (c) tissue sampling. This recommendation follows the consensus statement: Guidelines for Management of Incidental Pulmonary Nodules Detected on CT Images: From the Fleischner Society 2017; Radiology 2017; 284:228-243. Electronically Signed   By: Rolm Baptise M.D.   On: 01/03/2020 18:44        Scheduled Meds: . diltiazem  180 mg Oral Daily   . fluticasone furoate-vilanterol  1 puff Inhalation Daily  . pantoprazole (PROTONIX) IV  40 mg Intravenous QHS   Continuous Infusions:        Aline August, MD [facility] 01/04/2020, 8:00 AM

## 2020-01-04 NOTE — Progress Notes (Signed)
The patient is free of pain (never had pain) and has had no further bleeding since admission.    CT scan shows inflammatory changes in the proximal sigmoid, possibly related to diverticulitis since there is associated diverticulosis.  Specifically, no evidence of colonic neoplasm evident on CT.    White count remains normal, hemoglobin stable at 9.6.    C. difficile negative.  GI pathogen panel pending.  On exam, the patient is lying in bed in absolutely no distress, cognitively intact.  Abdomen is obese but soft and nontender.  Impression:  1.  Transient moderate volume hematochezia, presumably diverticular in origin, currently quiescent  2.  Acute posthemorrhagic anemia, stable  Recommendations:  1.  The patient was offered the option of sigmoidoscopic evaluation (unprepped) to help confirm the absence of distal colorectal neoplasia, versus Hemoccult monitoring as an outpatient, versus simply watchful waiting.  The patient prefers the latter option, in consideration of the fact that she has periodic PET scans (next planned for the late summer) for follow-up of a pulmonary nodule which was also noted on her CT.  2.  I explained to the patient that if this is diverticular bleeding, it is not uncommon for it to recur after a day or 2 of quiescence.  Therefore, observation for another day or so in the hospital is appropriate.  Agree with diet advancement.   3.  The big question is, when to resume the Eliquis.  I would recommend waiting approximately 5 days from now.  At that time, it should be safe to try resuming it.  By then, early recurrent diverticular bleeding should no longer be a risk, and there will have been a chance for clot maturation from wherever she bled from.  4.  I will sign off.  Please call us if you have questions or would like to discuss the patient's case, or if we can be of further assistance in the patient's care, or she has further significant bleeding.  Cleotis Nipper, M.D. Pager [phone] If no answer or after 5 PM call [phone]

## 2020-01-05 LAB — CBC WITH DIFFERENTIAL/PLATELET
Abs Immature Granulocytes: 0.01 10*3/uL (ref 0.00–0.07)
Basophils Absolute: 0 10*3/uL (ref 0.0–0.1)
Basophils Relative: 0 %
Eosinophils Absolute: 0.1 10*3/uL (ref 0.0–0.5)
Eosinophils Relative: 2 %
HCT: 24.5 % — ABNORMAL LOW (ref 36.0–46.0)
Hemoglobin: 8.7 g/dL — ABNORMAL LOW (ref 12.0–15.0)
Immature Granulocytes: 0 %
Lymphocytes Relative: 29 %
Lymphs Abs: 1.1 10*3/uL (ref 0.7–4.0)
MCH: 32.7 pg (ref 26.0–34.0)
MCHC: 35.5 g/dL (ref 30.0–36.0)
MCV: 92.1 fL (ref 80.0–100.0)
Monocytes Absolute: 0.4 10*3/uL (ref 0.1–1.0)
Monocytes Relative: 10 %
Neutro Abs: 2.2 10*3/uL (ref 1.7–7.7)
Neutrophils Relative %: 59 %
Platelets: 241 10*3/uL (ref 150–400)
RBC: 2.66 MIL/uL — ABNORMAL LOW (ref 3.87–5.11)
RDW: 20.2 % — ABNORMAL HIGH (ref 11.5–15.5)
WBC: 3.7 10*3/uL — ABNORMAL LOW (ref 4.0–10.5)
nRBC: 0 % (ref 0.0–0.2)

## 2020-01-05 LAB — BASIC METABOLIC PANEL
Anion gap: 10 (ref 5–15)
BUN: 20 mg/dL (ref 8–23)
CO2: 24 mmol/L (ref 22–32)
Calcium: 8.3 mg/dL — ABNORMAL LOW (ref 8.9–10.3)
Chloride: 103 mmol/L (ref 98–111)
Creatinine, Ser: 1.63 mg/dL — ABNORMAL HIGH (ref 0.44–1.00)
GFR calc Af Amer: 33 mL/min — ABNORMAL LOW (ref >60)
GFR calc non Af Amer: 28 mL/min — ABNORMAL LOW (ref >60)
Glucose, Bld: 100 mg/dL — ABNORMAL HIGH (ref 70–99)
Potassium: 3.8 mmol/L (ref 3.5–5.1)
Sodium: 137 mmol/L (ref 135–145)

## 2020-01-05 LAB — MAGNESIUM: Magnesium: 2 mg/dL (ref 1.7–2.4)

## 2020-01-05 MED ORDER — SACCHAROMYCES BOULARDII 250 MG PO CAPS
250.0000 mg | ORAL_CAPSULE | Freq: Two times a day (BID) | ORAL | Status: DC
  Administered 2020-01-05 – 2020-01-06 (×3): 250 mg via ORAL
  Filled 2020-01-05 (×3): qty 1

## 2020-01-05 NOTE — Care Management Important Message (Signed)
Important Message  Patient Details  Name: Natasha Ellis MRN: [id number] Date of Birth: [birth year]   Medicare Important Message Given:     Document has been given to Marney Doctor, NCM  to give to the patient.   Crista Luria 01/05/2020, 11:21 AM

## 2020-01-05 NOTE — TOC Progression Note (Signed)
Transition of Care [facility]) - Progression Note    Patient Details  Name: Natasha Ellis MRN: [id number] Date of Birth: [birth year]  Transition of Care [facility]) CM/SW Contact  Shade Flood, LCSW Phone Number: 01/05/2020, 10:43 AM  Clinical Narrative:     TOC following. Met with pt this AM to follow up on her thoughts about SNF rehab vs returning to her ILF at [facility]. Pt was returning from the bathroom with her RN at the time of LCSW entry to the room. Pt ambulated independently with her walker and standby assist. Per pt, she feels she is doing much better today and her preference is to go home. Pt's RN confirms that pt completed her tasks with standby assist. Discussed in home therapy and pt states that she can receive in home therapy from [facility] therapists.  Spoke with Claiborne Billings at [place] to update. Per Claiborne Billings, if pt only needs therapy in the home, their team can provide and MD would just need to write a HHPT/OT order that should be faxed to "[facility] [place]" at [phone] Bari Edward. If pt needs RN and therapy from [facility], then the referral should be sent to a [facility] agency of pt's preference for all care as [facility] cannot provide [facility] RN and Medicare will only pay one agency for in home services.  Claiborne Billings asks to be texted when dc date confirmed.   TOC will follow.   Expected Discharge Plan: Skilled Nursing Facility Barriers to Discharge: SNF Pending bed offer  Expected Discharge Plan and Services Expected Discharge Plan: [place] Choice: [place] arrangements for the past 2 months: Apartment                                       Social Determinants of Health (SDOH) Interventions    Readmission Risk Interventions Readmission Risk Prevention Plan 01/09/2019 01/09/2019  Transportation Screening Complete Complete  PCP or Specialist Appt within 3-5 Days Not Complete Not Complete  Not Complete  comments not ready for d/c -  HRI or Home Care Consult Complete -  Social Work Consult for [place] Planning/Counseling Complete -  Palliative Care Screening Not Applicable -  Medication Review Press photographer) Complete -  Some recent data might be hidden

## 2020-01-05 NOTE — Progress Notes (Signed)
Patient ID: Natasha Ellis, female   DOB: 1931-04-15, 86 y.o.   MRN: [id number]  PROGRESS NOTE    STEPHANA MORELL  IPJ:[id number] DOB: 11-Apr-1931 DOA: 01/02/2020 PCP: Lavone Orn, MD   Brief Narrative:  86 year old female with history of lymphoma and Waldenstrm macroglobulinemia who had received rituximab on Dec 26, 2019 and subsequently had constipation following which she had taken laxatives leading to diarrhea and then subsequently bloody bowel movement.  In the ED, hemoglobin was around baseline at 10.1.  Covid testing was negative.  Assessment & Plan:   Acute GI bleeding: Most likely lower GI bleeding Probable acute diverticulitis -Apixaban on hold.  Monitor H&H.  Transfuse if hemoglobin is less than 7 or patient is actively bleeding and is hemodynamically unstable. -Hemoglobin 9.6 today -GI follow-up appreciated. CT of the abdomen and pelvis done on 01/03/2020 for sigmoid diverticulosis with probable active diverticulitis.  Continue Rocephin and Flagyl.  Stool for C. difficile negative. GI PCR negative. -Tolerating soft diet.  No further rectal bleeding.  GI has signed off and recommend to hold Eliquis for 5 days before restarting.  Paroxysmal A. Fib -Eliquis held.  Currently rate controlled.  Continue Cardizem.  History of lymphoma and Waldenstrm macroglobulinemia -Outpatient follow-up with oncology/Dr. Ammie Dalton.  Received immunotherapy last week  History of chronic shortness of breath and bronchiectasis of lung nodules: Outpatient follow-up with pulmonary  Generalized deconditioning -PT eval recommends SNF placement.  Social worker consulted.  Patient wants to work with PT again today and think about this.  DVT prophylaxis: SCDs Code Status: DNR  family Communication: Spoke to patient at bedside Disposition Plan: Status is: Inpatient  Remains inpatient appropriate because: Patient needs intravenous antibiotics for diverticulitis because of persistent diarrhea.  Dispo: The  patient is from: independent living facility              Anticipated d/c is to: SNF              Anticipated d/c date is: Once bed is available              Patient currently is medically stable for discharge  Consultants: GI  Procedures: None  Antimicrobials: Rocephin and Flagyl from 01/04/2020 onwards   Subjective: Patient seen and examined at bedside. Poor historian.  No overnight rectal bleeding.  Feels that her diarrhea is improving.  Feels that her appetite is improving and wants to work with PT again before deciding about rehab placement.  No overnight fever or vomiting reported.   Objective: Vitals:   01/04/20 1354 01/04/20 2036 01/05/20 0437 01/05/20 0755  BP: (!) 114/50 (!) 113/59 (!) 111/50   Pulse: 74 84 80   Resp: 18 20 20    Temp: 99.1 F (37.3 C) 98.6 F (37 C) 98.4 F (36.9 C)   TempSrc: Oral     SpO2: 99% 93% 92% 93%  Weight:      Height:        Intake/Output Summary (Last 24 hours) at 01/05/2020 0816 Last data filed at 01/04/2020 1849 Gross per 24 hour  Intake 359.86 ml  Output --  Net 359.86 ml   Filed Weights   01/02/20 1947  Weight: 124.7 kg    Examination:  General exam: No distress.  Chronically ill looking elderly female sitting on chair.  Respiratory system: Bilateral decreased breath sounds at bases, no wheezing cardiovascular system: S1-S2 heard, rate controlled Gastrointestinal system: Abdomen is nondistended, soft and nontender.  Normal bowel sounds are heard  extremities: Trace lower extremity  edema. No clubbing   Data Reviewed: I have personally reviewed following labs and imaging studies  CBC: Recent Labs  Lab 01/02/20 2030 01/02/20 2030 01/02/20 2345 01/03/20 0335 01/03/20 0628 01/04/20 0530 01/05/20 0525  WBC 4.6   < > 4.7 4.8 4.4 5.3 3.7*  NEUTROABS 3.1  --   --   --   --  3.2 2.2  HGB 10.1*   < > 9.5* 8.8* 8.7* 9.6* 8.7*  HCT 26.9*   < > 27.5* 25.6* 25.3* 29.0* 24.5*  MCV 92.4   < > 91.4 90.8 89.7 89.5 92.1  PLT 345   < >  315 288 278 312 241   < > = values in this interval not displayed.   Basic Metabolic Panel: Recent Labs  Lab 01/02/20 2030 01/04/20 0530 01/05/20 0525  NA 138 134* 137  K 4.4 4.1 3.8  CL 100 101 103  CO2 26 25 24   GLUCOSE 111* 116* 100*  BUN 28* 22 20  CREATININE 1.67* 1.48* 1.63*  CALCIUM 9.2 8.8* 8.3*  MG  --  2.1 2.0   GFR: Estimated Creatinine Clearance: 33.4 mL/min (A) (by C-G formula based on SCr of 1.63 mg/dL (H)). Liver Function Tests: Recent Labs  Lab 01/02/20 2030  AST 25  ALT 23  ALKPHOS 66  BILITOT 0.9  PROT 7.8  ALBUMIN 3.2*   No results for input(s): LIPASE, AMYLASE in the last 168 hours. No results for input(s): AMMONIA in the last 168 hours. Coagulation Profile: Recent Labs  Lab 01/02/20 2030  INR 1.4*   Cardiac Enzymes: No results for input(s): CKTOTAL, CKMB, CKMBINDEX, TROPONINI in the last 168 hours. BNP (last 3 results) Recent Labs    03/14/19 1210 09/13/19 0938  PROBNP 445 133.0*   HbA1C: No results for input(s): HGBA1C in the last 72 hours. CBG: No results for input(s): GLUCAP in the last 168 hours. Lipid Profile: No results for input(s): CHOL, HDL, LDLCALC, TRIG, CHOLHDL, LDLDIRECT in the last 72 hours. Thyroid Function Tests: No results for input(s): TSH, T4TOTAL, FREET4, T3FREE, THYROIDAB in the last 72 hours. Anemia Panel: No results for input(s): VITAMINB12, FOLATE, FERRITIN, TIBC, IRON, RETICCTPCT in the last 72 hours. Sepsis Labs: No results for input(s): PROCALCITON, LATICACIDVEN in the last 168 hours.  Recent Results (from the past 240 hour(s))  SARS Coronavirus 2 by RT PCR (hospital order, performed in [facility] hospital lab) Nasopharyngeal Nasopharyngeal Swab     Status: None   Collection Time: 01/02/20 10:14 PM   Specimen: Nasopharyngeal Swab  Result Value Ref Range Status   SARS Coronavirus 2 NEGATIVE NEGATIVE Final    Comment: (NOTE) SARS-CoV-2 target nucleic acids are NOT DETECTED. The SARS-CoV-2 RNA is  generally detectable in upper and lower respiratory specimens during the acute phase of infection. The lowest concentration of SARS-CoV-2 viral copies this assay can detect is 250 copies / mL. A negative result does not preclude SARS-CoV-2 infection and should not be used as the sole basis for treatment or other patient management decisions.  A negative result may occur with improper specimen collection / handling, submission of specimen other than nasopharyngeal swab, presence of viral mutation(s) within the areas targeted by this assay, and inadequate number of viral copies (<250 copies / mL). A negative result must be combined with clinical observations, patient history, and epidemiological information. Fact Sheet for Patients:   StrictlyIdeas.no Fact Sheet for Healthcare Providers: BankingDealers.co.za This test is not yet approved or cleared  by the [place] FDA and  has been authorized for detection and/or diagnosis of SARS-CoV-2 by FDA under an Emergency Use Authorization (EUA).  This EUA will remain in effect (meaning this test can be used) for the duration of the COVID-19 declaration under Section 564(b)(1) of the Act, 21 U.S.C. section 360bbb-3(b)(1), unless the authorization is terminated or revoked sooner. Performed at [facility], [place] [address] [place], [place] [address]   Gastrointestinal Panel by PCR , Stool     Status: None   Collection Time: 01/03/20  6:08 PM   Specimen: STOOL  Result Value Ref Range Status   Campylobacter species NOT DETECTED NOT DETECTED Final   Plesimonas shigelloides NOT DETECTED NOT DETECTED Final   Salmonella species NOT DETECTED NOT DETECTED Final   Yersinia enterocolitica NOT DETECTED NOT DETECTED Final   Vibrio species NOT DETECTED NOT DETECTED Final   Vibrio cholerae NOT DETECTED NOT DETECTED Final   Enteroaggregative E coli (EAEC) NOT DETECTED NOT DETECTED Final    Enteropathogenic E coli (EPEC) NOT DETECTED NOT DETECTED Final   Enterotoxigenic E coli (ETEC) NOT DETECTED NOT DETECTED Final   Shiga like toxin producing E coli (STEC) NOT DETECTED NOT DETECTED Final   Shigella/Enteroinvasive E coli (EIEC) NOT DETECTED NOT DETECTED Final   Cryptosporidium NOT DETECTED NOT DETECTED Final   Cyclospora cayetanensis NOT DETECTED NOT DETECTED Final   Entamoeba histolytica NOT DETECTED NOT DETECTED Final   Giardia lamblia NOT DETECTED NOT DETECTED Final   Adenovirus F40/41 NOT DETECTED NOT DETECTED Final   Astrovirus NOT DETECTED NOT DETECTED Final   Norovirus GI/GII NOT DETECTED NOT DETECTED Final   Rotavirus A NOT DETECTED NOT DETECTED Final   Sapovirus (I, II, IV, and V) NOT DETECTED NOT DETECTED Final    Comment: Performed at [facility], [address] [place], [place] [address]  C Difficile Quick Screen w PCR reflex     Status: None   Collection Time: 01/03/20  6:08 PM   Specimen: STOOL  Result Value Ref Range Status   C Diff antigen NEGATIVE NEGATIVE Final   C Diff toxin NEGATIVE NEGATIVE Final   C Diff interpretation No C. difficile detected.  Final    Comment: Performed at [facility], [place] [address], [place], [place] [address]         Radiology Studies: CT ABDOMEN PELVIS WO CONTRAST  Result Date: 01/03/2020 CLINICAL DATA:  Rectal bleeding EXAM: CT ABDOMEN AND PELVIS WITHOUT CONTRAST TECHNIQUE: Multidetector CT imaging of the abdomen and pelvis was performed following the standard protocol without IV contrast. COMPARISON:  01/08/2016 FINDINGS: Lower chest: There is bronchiectasis and peribronchial thickening in the lower lobes. Bibasilar scarring. There is a nodule in the left lung base medially adjacent to the left hemidiaphragm measuring 13 mm on image 12. This previously measured 5 mm. Hepatobiliary: Multiple gallstones within the gallbladder. No focal hepatic abnormality. Pancreas: No focal abnormality or ductal  dilatation. Spleen: No focal abnormality.  Normal size. Adrenals/Urinary Tract: Kidneys appear atrophic, left worse than right. Fat containing lesion within the left adrenal gland measures 2.5 cm and is stable most compatible with myelolipoma. No hydronephrosis. Urinary bladder unremarkable. Stomach/Bowel: Sigmoid diverticulosis. Inflammatory stranding around the sigmoid colon compatible with active diverticulitis. Stomach and small bowel decompressed, unremarkable. Vascular/Lymphatic: Heavily calcified aorta. No aneurysm. Enlarged right inguinal lymph node with a short axis diameter measuring 3.3 cm. Reproductive: Prior hysterectomy.  No adnexal masses. Other: No free fluid or free air. Musculoskeletal: Compression fracture at L3 appears chronic but is new  since 2017. Diffuse degenerative changes. IMPRESSION: Sigmoid diverticulosis with inflammatory stranding around the proximal sigmoid colon compatible with active diverticulitis. Right inguinal adenopathy. Cannot exclude lymphoproliferative disorder. Cholelithiasis. Aortic atherosclerosis. Bronchiectasis and bronchial wall thickening in the lower lobes bilaterally. 13 mm left lower lobe nodule, enlarging since prior study. Consider one of the following in 3 months for both low-risk and high-risk individuals: (a) repeat chest CT, (b) follow-up PET-CT, or (c) tissue sampling. This recommendation follows the consensus statement: Guidelines for Management of Incidental Pulmonary Nodules Detected on CT Images: From the Fleischner Society 2017; Radiology 2017; 284:228-243. Electronically Signed   By: Rolm Baptise M.D.   On: 01/03/2020 18:44        Scheduled Meds: . diltiazem  180 mg Oral Daily  . fluticasone furoate-vilanterol  1 puff Inhalation Daily  . pantoprazole (PROTONIX) IV  40 mg Intravenous QHS   Continuous Infusions: . cefTRIAXone (ROCEPHIN)  IV 2 g (01/04/20 0949)  . metronidazole 500 mg (01/05/20 0055)          Aline August, MD [facility] 01/05/2020, 8:16 AM

## 2020-01-05 NOTE — Evaluation (Signed)
Occupational Therapy Evaluation Patient Details Name: Natasha Ellis MRN: [id number] DOB: 1930/09/22 Today's Date: 01/05/2020    History of Present Illness Patient is 86 year old female with history of lymphoma and Waldenstrm macroglobulinemia who had received rituximab on Dec 26, 2019 and subsequently had constipation following which she had taken laxatives leading to diarrhea and then subsequently bloody bowel movement. Patient admitted for concerns of acute GI bleed.   Clinical Impression   Natasha Ellis is an 86 year old woman admitted to hospital with rectal bleeding. On evaluation patient demonstrates generalized weakness, decreased activity tolerance and mild left sided weakness and decreased fine motor coordination from prior stroke resulting in decreased independence with ADLs and functional mobility. Patient reports she wants to go home at discharge, has a life alert and is agreeable to home health therapy. Patient reports a predominately sedentary life style and therapist provided education on improving quality of lift via physical activity and suggested several options. Patient verbalized understanding.      Follow Up Recommendations  Home health OT    Equipment Recommendations  None recommended by OT    Recommendations for Other Services       Precautions / Restrictions Restrictions Weight Bearing Restrictions: No      Mobility Bed Mobility                  Transfers                 General transfer comment: Patient min guard to stand from low seated recliner - with patient rocking to assist with stand. Min guard to ambulated to bathroom with RW. No loss of balance.    Balance Overall balance assessment: No apparent balance deficits (not formally assessed)                                         ADL either performed or assessed with clinical judgement   ADL Overall ADL's : Needs assistance/impaired Eating/Feeding: Set up    Grooming: Set up   Upper Body Bathing: Set up   Lower Body Bathing: Moderate assistance;Sit to/from stand   Upper Body Dressing : Set up   Lower Body Dressing: Minimal assistance;Sit to/from stand   Toilet Transfer: Min Engineer, site Details (indicate cue type and reason): Patient ambulated to bathroom. where BSC was placed. Toileting- Clothing Manipulation and Hygiene: Minimal assistance;Sit to/from stand       Functional mobility during ADLs: Min guard;Rolling walker       Vision Baseline Vision/History: Wears glasses Vision Assessment?: No apparent visual deficits     Perception     Praxis      Pertinent Vitals/Pain Pain Assessment: No/denies pain     Hand Dominance Right   Extremity/Trunk Assessment Upper Extremity Assessment Upper Extremity Assessment: Overall WFL for tasks assessed;RUE deficits/detail;LUE deficits/detail RUE Deficits / Details: 4/5 upper body strength LUE Deficits / Details: 4-/5 (history of left sided stroke) LUE Coordination: decreased fine motor   Lower Extremity Assessment Lower Extremity Assessment: Defer to PT evaluation   Cervical / Trunk Assessment Cervical / Trunk Assessment: Kyphotic   Communication Communication Communication: No difficulties   Cognition Arousal/Alertness: Awake/alert Behavior During Therapy: WFL for tasks assessed/performed Overall Cognitive Status: Within Functional Limits for tasks assessed  General Comments       Exercises     Shoulder Instructions      Home Living Family/patient expects to be discharged to:: Other (Comment)(ILF ([facility])) Living Arrangements: Alone Available Help at Discharge: Family(limited - daughter expresses she is not able to stay or pt) Type of Home: House Home Access: Level entry;Stairs to enter Entrance Stairs-Number of Steps: 1 step at back doorway; level entry at front Entrance  Stairs-Rails: None Home Layout: One level         Bathroom Toilet: Standard Bathroom Accessibility: Yes   Home Equipment: Walker - 4 wheels;Bedside commode;Shower seat - built in(shower seat is low, BSC over toilet to make taller)   Additional Comments: pt's daughter stating "nodbody can help her physicaly" regarding her mother's weakness and mobility limitations      Prior Functioning/Environment Level of Independence: Needs assistance;Independent with assistive device(s)  Gait / Transfers Assistance Needed: Patient reports using rollator and ambulating in home. ADL's / Homemaking Assistance Needed: patient reports independence with ADLs and light meal prep. Reports a predominantly sedentary lifestyle. Uses BSC and shower chair. Has a life alert.   Comments: pt/daughter report she is thinking about getting an aid        OT Problem List: Decreased strength;Decreased activity tolerance;Obesity      OT Treatment/Interventions: Self-care/ADL training;Therapeutic exercise;Therapeutic activities;Patient/family education    OT Goals(Current goals can be found in the care plan section) Acute Rehab OT Goals Patient Stated Goal: Patient wants to be strong enough to go home OT Goal Formulation: With patient Time For Goal Achievement: 01/19/20 Potential to Achieve Goals: Good  OT Frequency: Min 2X/week   Barriers to D/C: Decreased caregiver support          Co-evaluation              AM-PAC OT "6 Clicks" Daily Activity     Outcome Measure Help from another person eating meals?: None Help from another person taking care of personal grooming?: A Little Help from another person toileting, which includes using toliet, bedpan, or urinal?: A Little Help from another person bathing (including washing, rinsing, drying)?: A Little Help from another person to put on and taking off regular upper body clothing?: A Little Help from another person to put on and taking off regular lower  body clothing?: A Little 6 Click Score: 19   End of Session Equipment Utilized During Treatment: Gait belt;Rolling walker  Activity Tolerance: Patient tolerated treatment well Patient left: in chair;with call bell/phone within reach;with chair alarm set  OT Visit Diagnosis: Muscle weakness (generalized) (M62.81)                Time: 7078-6754 OT Time Calculation (min): 27 min Charges:  OT General Charges $OT Visit: 1 Visit OT Evaluation $OT Eval Low Complexity: 1 Low OT Treatments $Self Care/Home Management : 8-22 mins  Derl Barrow, OTR/L Acute Care Rehab Services  Office [phone]   Lenward Chancellor 01/05/2020, 12:53 PM

## 2020-01-06 DIAGNOSIS — N1832 Chronic kidney disease, stage 3b: Secondary | ICD-10-CM

## 2020-01-06 LAB — CBC WITH DIFFERENTIAL/PLATELET
Abs Immature Granulocytes: 0 10*3/uL (ref 0.00–0.07)
Band Neutrophils: 4 %
Basophils Absolute: 0 10*3/uL (ref 0.0–0.1)
Basophils Relative: 1 %
Blasts: 0 %
Eosinophils Absolute: 0.1 10*3/uL (ref 0.0–0.5)
Eosinophils Relative: 3 %
HCT: 25.2 % — ABNORMAL LOW (ref 36.0–46.0)
Hemoglobin: 8.4 g/dL — ABNORMAL LOW (ref 12.0–15.0)
Lymphocytes Relative: 25 %
Lymphs Abs: 1 10*3/uL (ref 0.7–4.0)
MCH: 29.6 pg (ref 26.0–34.0)
MCHC: 33.3 g/dL (ref 30.0–36.0)
MCV: 88.7 fL (ref 80.0–100.0)
Metamyelocytes Relative: 0 %
Monocytes Absolute: 0.4 10*3/uL (ref 0.1–1.0)
Monocytes Relative: 10 %
Myelocytes: 0 %
Neutro Abs: 2.3 10*3/uL (ref 1.7–7.7)
Neutrophils Relative %: 57 %
Other: 0 %
Platelets: 250 10*3/uL (ref 150–400)
Promyelocytes Relative: 0 %
RBC: 2.84 MIL/uL — ABNORMAL LOW (ref 3.87–5.11)
RDW: 17.5 % — ABNORMAL HIGH (ref 11.5–15.5)
WBC: 3.8 10*3/uL — ABNORMAL LOW (ref 4.0–10.5)
nRBC: 0 % (ref 0.0–0.2)
nRBC: 0 /100 WBC

## 2020-01-06 LAB — BASIC METABOLIC PANEL
Anion gap: 8 (ref 5–15)
BUN: 20 mg/dL (ref 8–23)
CO2: 25 mmol/L (ref 22–32)
Calcium: 8.4 mg/dL — ABNORMAL LOW (ref 8.9–10.3)
Chloride: 104 mmol/L (ref 98–111)
Creatinine, Ser: 1.54 mg/dL — ABNORMAL HIGH (ref 0.44–1.00)
GFR calc Af Amer: 35 mL/min — ABNORMAL LOW (ref >60)
GFR calc non Af Amer: 30 mL/min — ABNORMAL LOW (ref >60)
Glucose, Bld: 100 mg/dL — ABNORMAL HIGH (ref 70–99)
Potassium: 3.3 mmol/L — ABNORMAL LOW (ref 3.5–5.1)
Sodium: 137 mmol/L (ref 135–145)

## 2020-01-06 LAB — MAGNESIUM: Magnesium: 2 mg/dL (ref 1.7–2.4)

## 2020-01-06 MED ORDER — CEPHALEXIN 500 MG PO CAPS
500.0000 mg | ORAL_CAPSULE | Freq: Once | ORAL | Status: AC
  Administered 2020-01-06: 500 mg via ORAL
  Filled 2020-01-06: qty 1

## 2020-01-06 MED ORDER — APIXABAN 5 MG PO TABS: 5.0000 mg | ORAL_TABLET | Freq: Two times a day (BID) | ORAL | Status: DC

## 2020-01-06 MED ORDER — METRONIDAZOLE 500 MG PO TABS
500.0000 mg | ORAL_TABLET | Freq: Three times a day (TID) | ORAL | Status: DC
  Administered 2020-01-06 (×2): 500 mg via ORAL
  Filled 2020-01-06: qty 1

## 2020-01-06 MED ORDER — POTASSIUM CHLORIDE CRYS ER 20 MEQ PO TBCR
40.0000 meq | EXTENDED_RELEASE_TABLET | Freq: Once | ORAL | Status: AC
  Administered 2020-01-06: 40 meq via ORAL
  Filled 2020-01-06: qty 2

## 2020-01-06 MED ORDER — ALBUTEROL SULFATE 2.5 MG/0.5ML IN NEBU: 2.5000 mg | INHALATION_SOLUTION | Freq: Four times a day (QID) | RESPIRATORY_TRACT | Status: DC | PRN

## 2020-01-06 MED ORDER — PANTOPRAZOLE SODIUM 40 MG PO TBEC: 40.0000 mg | DELAYED_RELEASE_TABLET | Freq: Every day | ORAL | 0 refills | Status: DC

## 2020-01-06 MED ORDER — CEFPODOXIME PROXETIL 200 MG PO TABS: 200.0000 mg | ORAL_TABLET | Freq: Two times a day (BID) | ORAL | 0 refills | Status: AC

## 2020-01-06 MED ORDER — METRONIDAZOLE 500 MG PO TABS: 500.0000 mg | ORAL_TABLET | Freq: Three times a day (TID) | ORAL | 0 refills | Status: AC

## 2020-01-06 NOTE — Plan of Care (Signed)
Pt returning to [place] with her daughter. Alert, oriented,and without c/o. Pt aware of followup appointments. CM has sent required paperwork to [facility].

## 2020-01-06 NOTE — TOC Progression Note (Signed)
Transition of Care [facility]) - Progression Note    Patient Details  Name: Natasha Ellis MRN: [id number] Date of Birth: 1931/02/04  [facility] CM/SW Contact  Joaquin Courts, RN Phone Number: 01/06/2020, 11:12 AM  Clinical Narrative:    [facility]PT [facility]  [place] services to be arranged by [facility] staff.     Expected Discharge Plan: [place] Barriers to Discharge: No Barriers Identified  Expected Discharge Plan and Services Expected Discharge Plan: [place] Choice: [place] arrangements for the past 2 months: Apartment Expected Discharge Date: 01/06/20                                     Social Determinants of Health (SDOH) Interventions    Readmission Risk Interventions Readmission Risk Prevention Plan 01/09/2019 01/09/2019  Transportation Screening Complete Complete  PCP or Specialist Appt within 3-5 Days Not Complete Not Complete  Not Complete comments not ready for d/c -  HRI or Home Care Consult Complete -  Social Work Consult for [place] Planning/Counseling Complete -  Palliative Care Screening Not Applicable -  Medication Review Press photographer) Complete -  Some recent data might be hidden

## 2020-01-06 NOTE — Progress Notes (Signed)
Pt leaving this afternoon with her daughter. Pt is going back to [facility]. Discharge instructions given/explained with pt verbalizing understanding.

## 2020-01-06 NOTE — Progress Notes (Signed)
Physical Therapy Treatment Patient Details Name: Natasha Ellis MRN: [id number] DOB: 25-Jan-1931 Today's Date: 01/06/2020    History of Present Illness Patient is 86 year old female with history of lymphoma and [place] macroglobulinemia who had received rituximab on Dec 26, 2019 and subsequently had constipation following which she had taken laxatives leading to diarrhea and then subsequently bloody bowel movement. Patient admitted for concerns of acute GI bleed.    PT Comments    Pt with progressed mobility this day, and required no physical assistance from PT for transfers and gait this day. Pt ambulated 35 ft with use of RW, with supervision for safety and verbal cuing for form when using RW. Pt states she feels much closer to baseline, PT encouraged pt to slowly progress mobility at home and take rest breaks as needed. Pt with dyspnea on exertion this session, pt reports this is normal and SpO2 and HR WFL. PT updated plan to reflect HHPT, will continue to follow while acute.     Follow Up Recommendations  Home health PT;Supervision for mobility/OOB     Equipment Recommendations  None recommended by PT    Recommendations for Other Services OT consult     Precautions / Restrictions Precautions Precautions: Fall Restrictions Weight Bearing Restrictions: No    Mobility  Bed Mobility Overal bed mobility: Needs Assistance             General bed mobility comments: up to toilet with NT upon PT arrival to room, sitting on couch taking meds with RN upon PT exit.  Transfers Overall transfer level: Needs assistance Equipment used: Rolling walker (2 wheeled) Transfers: Sit to/from Stand Sit to Stand: Supervision         General transfer comment: for safety, verbal cuing for hand placement on chair when rising to stand. Sit to stand x2, from edge of couch along window.  Ambulation/Gait Ambulation/Gait assistance: Min guard;Supervision Gait Distance (Feet): 80  Feet Assistive device: Rolling walker (2 wheeled) Gait Pattern/deviations: Step-through pattern;Decreased stride length;Trunk flexed Gait velocity: decr   General Gait Details: Min guard initially, transitioning to supervision for safety. Verbal cuing for upright posture and closer proximity to RW.   Stairs             Wheelchair Mobility    Modified Rankin (Stroke Patients Only)       Balance Overall balance assessment: Needs assistance Sitting-balance support: Feet supported Sitting balance-Leahy Scale: Good     Standing balance support: No upper extremity supported Standing balance-Leahy Scale: Fair Standing balance comment: able to stand without UE support when donning pants, reliant on RW during ambulation                            Cognition Arousal/Alertness: Awake/alert Behavior During Therapy: WFL for tasks assessed/performed Overall Cognitive Status: Within Functional Limits for tasks assessed                                        Exercises      General Comments        Pertinent Vitals/Pain Pain Assessment: No/denies pain    Home Living                      Prior Function            PT Goals (current goals can now be found  in the care plan section) Acute Rehab PT Goals Patient Stated Goal: Patient wants to be strong enough to go home PT Goal Formulation: With patient Time For Goal Achievement: 01/18/20 Potential to Achieve Goals: Good Progress towards PT goals: Progressing toward goals    Frequency    Min 3X/week      PT Plan Discharge plan needs to be updated    Co-evaluation              AM-PAC PT "6 Clicks" Mobility   Outcome Measure  Help needed turning from your back to your side while in a flat bed without using bedrails?: A Little Help needed moving from lying on your back to sitting on the side of a flat bed without using bedrails?: A Little Help needed moving to and from a  bed to a chair (including a wheelchair)?: None Help needed standing up from a chair using your arms (e.g., wheelchair or bedside chair)?: None Help needed to walk in hospital room?: A Little Help needed climbing 3-5 steps with a railing? : A Lot 6 Click Score: 19    End of Session Equipment Utilized During Treatment: Gait belt Activity Tolerance: Patient tolerated treatment well;Patient limited by fatigue Patient left: in chair;with chair alarm set;with family/visitor present;with call bell/phone within reach Nurse Communication: Mobility status PT Visit Diagnosis: Unsteadiness on feet (R26.81);Muscle weakness (generalized) (M62.81);Difficulty in walking, not elsewhere classified (R26.2)     Time: 9024-0973 PT Time Calculation (min) (ACUTE ONLY): 20 min  Charges:  $Gait Training: 8-22 mins                     Jerzy Roepke E, PT Acute Rehabilitation Services Pager [phone]  Office [phone]   Laurie Lovejoy D [place] 01/06/2020, 10:21 AM

## 2020-01-06 NOTE — Discharge Summary (Signed)
Physician Discharge Summary  Natasha Ellis ENI:[id number] DOB: 1930-09-04 DOA: 01/02/2020  PCP: Lavone Orn, MD  Admit date: 01/02/2020 Discharge date: 01/06/2020  Admitted From: Assisted-living facility Disposition: Assisted living facility.  Refused SNF  Recommendations for Outpatient Follow-up:  1. Follow up with PCP in 1 week with repeat CBC/BMP 2. Outpatient follow-up with GI 3. Hold Eliquis for 5 more days.  Resume on 01/12/2020 if no further black/bloody stools 4. Follow up in ED if symptoms worsen or new appear   Home Health: PT/OT Equipment/Devices: None  Discharge Condition: Stable CODE STATUS: Full Diet recommendation: Heart healthy  Brief/Interim Summary: 86 year old female with history of lymphoma and Waldenstrm macroglobulinemia who had received rituximab on Dec 26, 2019 and subsequently had constipation following which she had taken laxatives leading to diarrhea and then subsequently bloody bowel movement.  In the ED, hemoglobin was around baseline at 10.1.  Covid testing was negative.  During the hospitalization, GI was consulted.  CT of the abdomen and pelvis showed sigmoid diverticulosis with probable acute diverticulitis.  She was started on IV antibiotics.  Stool for C. difficile and GI PCR were negative.  Patient decided for conservative management.  GI signed off.  She is tolerating diet she had an episode of black stool this morning which might be old blood.  She feels okay and wants to go back to her assisted living facility and has refused SNF placement.  She will be discharged on oral Vantin and Flagyl for 10 days.  Resume Eliquis on 01/12/2020 if no further black/bloody stools.  Outpatient follow-up with GI.  Discharge Diagnoses:   Acute GI bleeding: Most likely lower GI bleeding Probable acute diverticulitis -Apixaban on hold.    Hemoglobin did not drop much during the hospitalization. -Hemoglobin 8.4 today.  Outpatient follow-up of CBC within a week. -GI  follow-up appreciated. CT of the abdomen and pelvis done on 01/03/2020 for sigmoid diverticulosis with probable active diverticulitis.  Patient opted for conservative treatment.   Currently on Rocephin and Flagyl.  Stool for C. difficile negative. GI PCR negative. -Tolerating soft diet.  Patient had an episode of black stool this morning which might be the sequelae of her recent rectal bleeding.  Hemoglobin is still stable. GI has signed off and recommend to hold Eliquis for 5 days before restarting.  She is she had back still this morning, would recommend to hold Eliquis for 5 more days and restart on 01/12/2020 if no more black/bloody stools. -Outpatient follow-up with GI. -Discharge on Vantin and Flagyl for 10 more days.  will also put her on Protonix 40 mg daily.  Paroxysmal A. Fib -Eliquis plan as above. Currently rate controlled.  Continue Cardizem.  Chronic kidney disease stage IIIb -Creatinine stable.  Hold Lasix for now till reevaluation by PCP.  Outpatient follow-up.  History of lymphoma and Waldenstrm macroglobulinemia -Outpatient follow-up with oncology/Dr. Ammie Dalton.  Received immunotherapy last week  History of chronic shortness of breath and bronchiectasis of lung nodules: -Outpatient follow-up with pulmonary  Morbid obesity -Outpatient follow-up  Generalized deconditioning -PT eval recommends SNF placement.    Patient refused SNF placement.  Will need home health PT/OT.  Discharge Instructions  Discharge Instructions    Diet - low sodium heart healthy   Complete by: As directed    Increase activity slowly   Complete by: As directed      Allergies as of 01/06/2020      Reactions   Codeine Other (See Comments)   Crazy-nightmares   Amoxicillin Diarrhea  Has patient had a PCN reaction causing immediate rash, facial/tongue/throat swelling, SOB or lightheadedness with hypotension:  No Has patient had a PCN reaction causing severe rash involving mucus membranes or skin  necrosis: No Has patient had a PCN reaction that required hospitalization No Has patient had a PCN reaction occurring within the last 10 years: Unknown--rx is diarrhea. If all of the above answers are "NO", then may proceed with Cephalosporin use.   Cefdinir Cough   Doxycycline Diarrhea   Erythromycin Other (See Comments)   GI upset   Lisinopril Cough   Oxybutynin Other (See Comments)   Dry mouth   Sulfa Antibiotics Other (See Comments)   GI upset      Medication List    STOP taking these medications   furosemide 40 MG tablet Commonly known as: LASIX     TAKE these medications   acetaminophen 500 MG tablet Commonly known as: TYLENOL Take 1,000 mg by mouth every 6 (six) hours as needed for pain.   albuterol 108 (90 Base) MCG/ACT inhaler Commonly known as: ProAir HFA Inhale 2 puffs into the lungs every 6 (six) hours as needed for wheezing or shortness of breath.   Albuterol Sulfate 2.5 MG/0.5ML Nebu Inhale 0.5 mLs (2.5 mg total) into the lungs every 6 (six) hours as needed (sob/wheezing).   apixaban 5 MG Tabs tablet Commonly known as: ELIQUIS Take 1 tablet (5 mg total) by mouth 2 (two) times daily. Restart from 01/12/20 if no more black/bloody stools Start taking on: January 12, 2020 What changed:   additional instructions  These instructions start on January 12, 2020. If you are unsure what to do until then, ask your doctor or other care provider.   Breo Ellipta 100-25 MCG/INH Aepb Generic drug: fluticasone furoate-vilanterol Inhale 1 puff into the lungs daily.   calcium carbonate 500 MG chewable tablet Commonly known as: TUMS - dosed in mg elemental calcium Chew 1 tablet by mouth 2 (two) times daily as needed for indigestion or heartburn.   calcium gluconate 500 MG tablet Take 1 tablet by mouth daily.   cefpodoxime 200 MG tablet Commonly known as: VANTIN Take 1 tablet (200 mg total) by mouth 2 (two) times daily for 10 days.   diltiazem 180 MG 24 hr  capsule Commonly known as: CARDIZEM CD Take 1 capsule (180 mg total) by mouth daily.   ferrous sulfate 325 (65 FE) MG tablet Take 325 mg by mouth daily.   Flutter Devi Use as directed.   metroNIDAZOLE 500 MG tablet Commonly known as: FLAGYL Take 1 tablet (500 mg total) by mouth every 8 (eight) hours for 10 days.   pantoprazole 40 MG tablet Commonly known as: Protonix Take 1 tablet (40 mg total) by mouth daily.   potassium chloride 10 MEQ tablet Commonly known as: KLOR-CON Take 10 mEq by mouth daily.   sodium chloride 0.65 % Soln nasal spray Commonly known as: OCEAN Place 1 spray into both nostrils 3 (three) times daily as needed for congestion.      Follow-up Information    Lavone Orn, MD. Schedule an appointment as soon as possible for a visit in 1 week(s).   Specialty: Internal Medicine Why: with repeat cbc/bmp Contact information: [address] [place] [address] [phone]        Ronald Lobo, MD. Schedule an appointment as soon as possible for a visit in 1 week(s).   Specialty: Gastroenterology Contact information: [address] [place] [address] [phone]  Allergies  Allergen Reactions  . Codeine Other (See Comments)    Crazy-nightmares  . Amoxicillin Diarrhea    Has patient had a PCN reaction causing immediate rash, facial/tongue/throat swelling, SOB or lightheadedness with hypotension:  No Has patient had a PCN reaction causing severe rash involving mucus membranes or skin necrosis: No Has patient had a PCN reaction that required hospitalization No Has patient had a PCN reaction occurring within the last 10 years: Unknown--rx is diarrhea. If all of the above answers are "NO", then may proceed with Cephalosporin use.    . Cefdinir Cough  . Doxycycline Diarrhea  . Erythromycin Other (See Comments)    GI upset  . Lisinopril Cough  . Oxybutynin Other (See Comments)    Dry mouth  . Sulfa  Antibiotics Other (See Comments)    GI upset    Consultations:  GI   Procedures/Studies: CT ABDOMEN PELVIS WO CONTRAST  Result Date: 01/03/2020 CLINICAL DATA:  Rectal bleeding EXAM: CT ABDOMEN AND PELVIS WITHOUT CONTRAST TECHNIQUE: Multidetector CT imaging of the abdomen and pelvis was performed following the standard protocol without IV contrast. COMPARISON:  01/08/2016 FINDINGS: Lower chest: There is bronchiectasis and peribronchial thickening in the lower lobes. Bibasilar scarring. There is a nodule in the left lung base medially adjacent to the left hemidiaphragm measuring 13 mm on image 12. This previously measured 5 mm. Hepatobiliary: Multiple gallstones within the gallbladder. No focal hepatic abnormality. Pancreas: No focal abnormality or ductal dilatation. Spleen: No focal abnormality.  Normal size. Adrenals/Urinary Tract: Kidneys appear atrophic, left worse than right. Fat containing lesion within the left adrenal gland measures 2.5 cm and is stable most compatible with myelolipoma. No hydronephrosis. Urinary bladder unremarkable. Stomach/Bowel: Sigmoid diverticulosis. Inflammatory stranding around the sigmoid colon compatible with active diverticulitis. Stomach and small bowel decompressed, unremarkable. Vascular/Lymphatic: Heavily calcified aorta. No aneurysm. Enlarged right inguinal lymph node with a short axis diameter measuring 3.3 cm. Reproductive: Prior hysterectomy.  No adnexal masses. Other: No free fluid or free air. Musculoskeletal: Compression fracture at L3 appears chronic but is new since 2017. Diffuse degenerative changes. IMPRESSION: Sigmoid diverticulosis with inflammatory stranding around the proximal sigmoid colon compatible with active diverticulitis. Right inguinal adenopathy. Cannot exclude lymphoproliferative disorder. Cholelithiasis. Aortic atherosclerosis. Bronchiectasis and bronchial wall thickening in the lower lobes bilaterally. 13 mm left lower lobe nodule, enlarging  since prior study. Consider one of the following in 3 months for both low-risk and high-risk individuals: (a) repeat chest CT, (b) follow-up PET-CT, or (c) tissue sampling. This recommendation follows the consensus statement: Guidelines for Management of Incidental Pulmonary Nodules Detected on CT Images: From the Fleischner Society 2017; Radiology 2017; 284:228-243. Electronically Signed   By: Rolm Baptise M.D.   On: 01/03/2020 18:44       Subjective: Patient seen and examined at bedside.  She had an episode of black stool this morning.  Denies worsening abdominal pain or shortness of breath no chest pain or fever.  She feels okay and thinks that she is ready to go back to her assisted living facility today.  Discharge Exam: Vitals:   01/06/20 0512 01/06/20 0808  BP: (!) 123/57   Pulse: 71 68  Resp: 16 16  Temp: 98.6 F (37 C)   SpO2: 94% 94%    General: Pt is alert, awake, not in acute distress Cardiovascular: rate controlled, S1/S2 + Respiratory: bilateral decreased breath sounds at bases Abdominal: Soft, morbidly obese, NT, ND, bowel sounds + Extremities: Trace lower extremity edema; no cyanosis  The results of significant diagnostics from this hospitalization (including imaging, microbiology, ancillary and laboratory) are listed below for reference.     Microbiology: Recent Results (from the past 240 hour(s))  SARS Coronavirus 2 by RT PCR (hospital order, performed in [facility] hospital lab) Nasopharyngeal Nasopharyngeal Swab     Status: None   Collection Time: 01/02/20 10:14 PM   Specimen: Nasopharyngeal Swab  Result Value Ref Range Status   SARS Coronavirus 2 NEGATIVE NEGATIVE Final    Comment: (NOTE) SARS-CoV-2 target nucleic acids are NOT DETECTED. The SARS-CoV-2 RNA is generally detectable in upper and lower respiratory specimens during the acute phase of infection. The lowest concentration of SARS-CoV-2 viral copies this assay can detect is 250 copies / mL. A  negative result does not preclude SARS-CoV-2 infection and should not be used as the sole basis for treatment or other patient management decisions.  A negative result may occur with improper specimen collection / handling, submission of specimen other than nasopharyngeal swab, presence of viral mutation(s) within the areas targeted by this assay, and inadequate number of viral copies (<250 copies / mL). A negative result must be combined with clinical observations, patient history, and epidemiological information. Fact Sheet for Patients:   StrictlyIdeas.no Fact Sheet for Healthcare Providers: BankingDealers.co.za This test is not yet approved or cleared  by the [place] FDA and has been authorized for detection and/or diagnosis of SARS-CoV-2 by FDA under an Emergency Use Authorization (EUA).  This EUA will remain in effect (meaning this test can be used) for the duration of the COVID-19 declaration under Section 564(b)(1) of the Act, 21 U.S.C. section 360bbb-3(b)(1), unless the authorization is terminated or revoked sooner. Performed at [facility], [place] [address] [place], [place] [address]   Gastrointestinal Panel by PCR , Stool     Status: None   Collection Time: 01/03/20  6:08 PM   Specimen: STOOL  Result Value Ref Range Status   Campylobacter species NOT DETECTED NOT DETECTED Final   Plesimonas shigelloides NOT DETECTED NOT DETECTED Final   Salmonella species NOT DETECTED NOT DETECTED Final   Yersinia enterocolitica NOT DETECTED NOT DETECTED Final   Vibrio species NOT DETECTED NOT DETECTED Final   Vibrio cholerae NOT DETECTED NOT DETECTED Final   Enteroaggregative E coli (EAEC) NOT DETECTED NOT DETECTED Final   Enteropathogenic E coli (EPEC) NOT DETECTED NOT DETECTED Final   Enterotoxigenic E coli (ETEC) NOT DETECTED NOT DETECTED Final   Shiga like toxin producing E coli (STEC) NOT DETECTED NOT DETECTED  Final   Shigella/Enteroinvasive E coli (EIEC) NOT DETECTED NOT DETECTED Final   Cryptosporidium NOT DETECTED NOT DETECTED Final   Cyclospora cayetanensis NOT DETECTED NOT DETECTED Final   Entamoeba histolytica NOT DETECTED NOT DETECTED Final   Giardia lamblia NOT DETECTED NOT DETECTED Final   Adenovirus F40/41 NOT DETECTED NOT DETECTED Final   Astrovirus NOT DETECTED NOT DETECTED Final   Norovirus GI/GII NOT DETECTED NOT DETECTED Final   Rotavirus A NOT DETECTED NOT DETECTED Final   Sapovirus (I, II, IV, and V) NOT DETECTED NOT DETECTED Final    Comment: Performed at [facility], [address] [place], [place] [address]  C Difficile Quick Screen w PCR reflex     Status: None   Collection Time: 01/03/20  6:08 PM   Specimen: STOOL  Result Value Ref Range Status   C Diff antigen NEGATIVE NEGATIVE Final   C Diff toxin NEGATIVE NEGATIVE Final   C Diff interpretation No  C. difficile detected.  Final    Comment: Performed at [facility], [place] [address], [place], [place] [address]     Labs: BNP (last 3 results) Recent Labs    01/06/19 1822  BNP 378.5*   Basic Metabolic Panel: Recent Labs  Lab 01/02/20 2030 01/04/20 0530 01/05/20 0525 01/06/20 0454  NA 138 134* 137 137  K 4.4 4.1 3.8 3.3*  CL 100 101 103 104  CO2 26 25 24 25   GLUCOSE 111* 116* 100* 100*  BUN 28* 22 20 20   CREATININE 1.67* 1.48* 1.63* 1.54*  CALCIUM 9.2 8.8* 8.3* 8.4*  MG  --  2.1 2.0 2.0   Liver Function Tests: Recent Labs  Lab 01/02/20 2030  AST 25  ALT 23  ALKPHOS 66  BILITOT 0.9  PROT 7.8  ALBUMIN 3.2*   No results for input(s): LIPASE, AMYLASE in the last 168 hours. No results for input(s): AMMONIA in the last 168 hours. CBC: Recent Labs  Lab 01/02/20 2030 01/02/20 2345 01/03/20 0335 01/03/20 0628 01/04/20 0530 01/05/20 0525 01/06/20 0454  WBC 4.6   < > 4.8 4.4 5.3 3.7* 3.8*  NEUTROABS 3.1  --   --   --  3.2 2.2 2.3  HGB 10.1*   < > 8.8* 8.7* 9.6* 8.7*  8.4*  HCT 26.9*   < > 25.6* 25.3* 29.0* 24.5* 25.2*  MCV 92.4   < > 90.8 89.7 89.5 92.1 88.7  PLT 345   < > 288 278 312 241 250   < > = values in this interval not displayed.   Cardiac Enzymes: No results for input(s): CKTOTAL, CKMB, CKMBINDEX, TROPONINI in the last 168 hours. BNP: Invalid input(s): POCBNP CBG: No results for input(s): GLUCAP in the last 168 hours. D-Dimer No results for input(s): DDIMER in the last 72 hours. Hgb A1c No results for input(s): HGBA1C in the last 72 hours. Lipid Profile No results for input(s): CHOL, HDL, LDLCALC, TRIG, CHOLHDL, LDLDIRECT in the last 72 hours. Thyroid function studies No results for input(s): TSH, T4TOTAL, T3FREE, THYROIDAB in the last 72 hours.  Invalid input(s): FREET3 Anemia work up No results for input(s): VITAMINB12, FOLATE, FERRITIN, TIBC, IRON, RETICCTPCT in the last 72 hours. Urinalysis    Component Value Date/Time   COLORURINE AMBER (A) 01/06/2019 1823   APPEARANCEUR CLOUDY (A) 01/06/2019 1823   LABSPEC 1.016 01/06/2019 1823   PHURINE 5.0 01/06/2019 1823   GLUCOSEU NEGATIVE 01/06/2019 1823   HGBUR NEGATIVE 01/06/2019 1823   BILIRUBINUR NEGATIVE 01/06/2019 1823   BILIRUBINUR neg 04/20/2013 1728   KETONESUR NEGATIVE 01/06/2019 1823   PROTEINUR NEGATIVE 01/06/2019 1823   UROBILINOGEN 0.2 05/21/2015 1920   NITRITE NEGATIVE 01/06/2019 1823   LEUKOCYTESUR NEGATIVE 01/06/2019 1823   Sepsis Labs Invalid input(s): PROCALCITONIN,  WBC,  LACTICIDVEN Microbiology Recent Results (from the past 240 hour(s))  SARS Coronavirus 2 by RT PCR (hospital order, performed in [place] hospital lab) Nasopharyngeal Nasopharyngeal Swab     Status: None   Collection Time: 01/02/20 10:14 PM   Specimen: Nasopharyngeal Swab  Result Value Ref Range Status   SARS Coronavirus 2 NEGATIVE NEGATIVE Final    Comment: (NOTE) SARS-CoV-2 target nucleic acids are NOT DETECTED. The SARS-CoV-2 RNA is generally detectable in upper and  lower respiratory specimens during the acute phase of infection. The lowest concentration of SARS-CoV-2 viral copies this assay can detect is 250 copies / mL. A negative result does not preclude SARS-CoV-2 infection and should not be used as the  sole basis for treatment or other patient management decisions.  A negative result may occur with improper specimen collection / handling, submission of specimen other than nasopharyngeal swab, presence of viral mutation(s) within the areas targeted by this assay, and inadequate number of viral copies (<250 copies / mL). A negative result must be combined with clinical observations, patient history, and epidemiological information. Fact Sheet for Patients:   StrictlyIdeas.no Fact Sheet for Healthcare Providers: BankingDealers.co.za This test is not yet approved or cleared  by the [place] FDA and has been authorized for detection and/or diagnosis of SARS-CoV-2 by FDA under an Emergency Use Authorization (EUA).  This EUA will remain in effect (meaning this test can be used) for the duration of the COVID-19 declaration under Section 564(b)(1) of the Act, 21 U.S.C. section 360bbb-3(b)(1), unless the authorization is terminated or revoked sooner. Performed at [facility], [place] [address], [place], [place] [address]   Gastrointestinal Panel by PCR , Stool     Status: None   Collection Time: 01/03/20  6:08 PM   Specimen: STOOL  Result Value Ref Range Status   Campylobacter species NOT DETECTED NOT DETECTED Final   Plesimonas shigelloides NOT DETECTED NOT DETECTED Final   Salmonella species NOT DETECTED NOT DETECTED Final   Yersinia enterocolitica NOT DETECTED NOT DETECTED Final   Vibrio species NOT DETECTED NOT DETECTED Final   Vibrio cholerae NOT DETECTED NOT DETECTED Final   Enteroaggregative E coli (EAEC) NOT DETECTED NOT DETECTED Final   Enteropathogenic E coli (EPEC) NOT  DETECTED NOT DETECTED Final   Enterotoxigenic E coli (ETEC) NOT DETECTED NOT DETECTED Final   Shiga like toxin producing E coli (STEC) NOT DETECTED NOT DETECTED Final   Shigella/Enteroinvasive E coli (EIEC) NOT DETECTED NOT DETECTED Final   Cryptosporidium NOT DETECTED NOT DETECTED Final   Cyclospora cayetanensis NOT DETECTED NOT DETECTED Final   Entamoeba histolytica NOT DETECTED NOT DETECTED Final   Giardia lamblia NOT DETECTED NOT DETECTED Final   Adenovirus F40/41 NOT DETECTED NOT DETECTED Final   Astrovirus NOT DETECTED NOT DETECTED Final   Norovirus GI/GII NOT DETECTED NOT DETECTED Final   Rotavirus A NOT DETECTED NOT DETECTED Final   Sapovirus (I, II, IV, and V) NOT DETECTED NOT DETECTED Final    Comment: Performed at [facility], [address] [place], [place] [address]  C Difficile Quick Screen w PCR reflex     Status: None   Collection Time: 01/03/20  6:08 PM   Specimen: STOOL  Result Value Ref Range Status   C Diff antigen NEGATIVE NEGATIVE Final   C Diff toxin NEGATIVE NEGATIVE Final   C Diff interpretation No C. difficile detected.  Final    Comment: Performed at [facility], [place] [address] [place], [place] [address]     Time coordinating discharge: 35 minutes  SIGNED:   Aline August, MD  [facility] 01/06/2020, 11:39 AM

## 2020-01-09 DIAGNOSIS — D500 Iron deficiency anemia secondary to blood loss (chronic): Secondary | ICD-10-CM | POA: Diagnosis not present

## 2020-01-09 DIAGNOSIS — K5793 Diverticulitis of intestine, part unspecified, without perforation or abscess with bleeding: Secondary | ICD-10-CM | POA: Diagnosis not present

## 2020-01-09 DIAGNOSIS — R609 Edema, unspecified: Secondary | ICD-10-CM | POA: Diagnosis not present

## 2020-01-09 DIAGNOSIS — N1832 Chronic kidney disease, stage 3b: Secondary | ICD-10-CM | POA: Diagnosis not present

## 2020-01-16 ENCOUNTER — Telehealth: Payer: Self-pay | Admitting: *Deleted

## 2020-01-16 NOTE — Telephone Encounter (Signed)
Left VM that she does not feel she will be up to treatment on Monday, stating she is "so weak". Was in hospital recently w/rectal bleed. Dr. Lavone Orn did labs on her last week and she thinks they were OK. Still plans to come, but wanted MD aware she is very fatigued and may not accept treatment that day.

## 2020-01-16 NOTE — Progress Notes (Signed)
Pharmacist Chemotherapy Monitoring - Follow Up Assessment    I verify that I have reviewed each item in the below checklist:  . Regimen for the patient is scheduled for the appropriate day and plan matches scheduled date. Marland Kitchen Appropriate non-routine labs are ordered dependent on drug ordered. . If applicable, additional medications reviewed and ordered per protocol based on lifetime cumulative doses and/or treatment regimen.   Plan for follow-up and/or issues identified: Yes . I-vent associated with next due treatment: Yes . MD and/or nursing notified: No   Kennith Center, Pharm.D., CPP 01/16/2020@2 :45 PM

## 2020-01-18 ENCOUNTER — Other Ambulatory Visit: Payer: Self-pay | Admitting: Nurse Practitioner

## 2020-01-18 NOTE — Telephone Encounter (Signed)
Eliquis 5mg  refill request received. Patient is 86 years old, weight-124.7kg, Crea-1.54 on 01/16/2020, Diagnosis-Afib, and last seen by Truitt Merle on 03/14/2019. Doseage is inappropriate based on dosing criteria therefore, will send a message to Dr. Radford Pax regarding this.  Pt was in the hospital 01/02/20-01/06/20 for GI Bleeding and went back to assisted living and was told to hold her Eliquis 5 more days and resume on 01/12/20 if no further black/bloody stools.

## 2020-01-18 NOTE — Telephone Encounter (Signed)
-----   Message from Sueanne Margarita, MD sent at 01/18/2020  4:47 PM EDT ----- Regarding: RE: Eliquis doseage When patient restarts her ELiquis it should be 2.5mg  BID.  Restart on date recommended at discharge but at the lower dose.   Traci ----- Message ----- From: Marcos Eke, RN Sent: 01/18/2020   4:22 PM EDT To: Sueanne Margarita, MD Subject: Eliquis doseage                                Patient has requested a refill on Eliquis 5mg . After assessing dosing criteria pt is 86 years old, weight-124.7kg, Crea-1.54 on 01/16/2020, Diagnosis-Afib, and last seen by Truitt Merle on 03/14/2019. Doseage is inappropriate based on dosing criteria. Also, pt was in the hospital for GI bleed on 01/02/20-01/06/20 and went back to her assisted living and was told to hold her Eliquis 5 more days and resume on 01/12/20 if no further black/bloody stools. Please advise.  Thanks,  Derrel Nip, RN, BSN

## 2020-01-18 NOTE — Telephone Encounter (Signed)
Will send in lower dose of Eliquis 2.5mg  BID per Dr. Radford Pax. Also, called the pt to make her aware and she has some 5mg  left that she will break in half starting tomorrow and then once she receives the 2.5mg  tablet she will start twice a day. She states no dark or bloody stools and she has resumed her Eliquis but will start 2.5mg  twice a day tomorrow. Explained to the pt the reasoning and she verbalized understanding.

## 2020-01-20 ENCOUNTER — Other Ambulatory Visit: Payer: Self-pay | Admitting: Oncology

## 2020-01-22 ENCOUNTER — Other Ambulatory Visit: Payer: Self-pay

## 2020-01-22 ENCOUNTER — Telehealth: Payer: Self-pay | Admitting: Oncology

## 2020-01-22 ENCOUNTER — Inpatient Hospital Stay: Payer: Medicare Other

## 2020-01-22 ENCOUNTER — Inpatient Hospital Stay: Payer: Medicare Other | Attending: Oncology | Admitting: Oncology

## 2020-01-22 VITALS — BP 123/61 | HR 79 | Temp 98.2°F | Resp 17 | Ht 66.0 in | Wt 269.4 lb

## 2020-01-22 DIAGNOSIS — G4730 Sleep apnea, unspecified: Secondary | ICD-10-CM | POA: Insufficient documentation

## 2020-01-22 DIAGNOSIS — C8800 Waldenstrom macroglobulinemia not having achieved remission: Secondary | ICD-10-CM

## 2020-01-22 DIAGNOSIS — Z8673 Personal history of transient ischemic attack (TIA), and cerebral infarction without residual deficits: Secondary | ICD-10-CM | POA: Insufficient documentation

## 2020-01-22 LAB — CMP (CANCER CENTER ONLY)
ALT: 17 U/L (ref 0–44)
AST: 20 U/L (ref 15–41)
Albumin: 3.1 g/dL — ABNORMAL LOW (ref 3.5–5.0)
Alkaline Phosphatase: 68 U/L (ref 38–126)
Anion gap: 14 (ref 5–15)
BUN: 17 mg/dL (ref 8–23)
CO2: 25 mmol/L (ref 22–32)
Calcium: 10 mg/dL (ref 8.9–10.3)
Chloride: 104 mmol/L (ref 98–111)
Creatinine: 1.43 mg/dL — ABNORMAL HIGH (ref 0.44–1.00)
GFR, Est AFR Am: 38 mL/min — ABNORMAL LOW (ref >60)
GFR, Estimated: 33 mL/min — ABNORMAL LOW (ref >60)
Glucose, Bld: 139 mg/dL — ABNORMAL HIGH (ref 70–99)
Potassium: 3.7 mmol/L (ref 3.5–5.1)
Sodium: 143 mmol/L (ref 135–145)
Total Bilirubin: 0.8 mg/dL (ref 0.3–1.2)
Total Protein: 8.5 g/dL — ABNORMAL HIGH (ref 6.5–8.1)

## 2020-01-22 LAB — CBC WITH DIFFERENTIAL (CANCER CENTER ONLY)
Abs Immature Granulocytes: 0.01 10*3/uL (ref 0.00–0.07)
Basophils Absolute: 0 10*3/uL (ref 0.0–0.1)
Basophils Relative: 1 %
Eosinophils Absolute: 0.1 10*3/uL (ref 0.0–0.5)
Eosinophils Relative: 3 %
HCT: 28 % — ABNORMAL LOW (ref 36.0–46.0)
Hemoglobin: 9.3 g/dL — ABNORMAL LOW (ref 12.0–15.0)
Immature Granulocytes: 0 %
Lymphocytes Relative: 44 %
Lymphs Abs: 1.9 10*3/uL (ref 0.7–4.0)
MCH: 29.6 pg (ref 26.0–34.0)
MCHC: 33.2 g/dL (ref 30.0–36.0)
MCV: 89.2 fL (ref 80.0–100.0)
Monocytes Absolute: 0.4 10*3/uL (ref 0.1–1.0)
Monocytes Relative: 9 %
Neutro Abs: 1.9 10*3/uL (ref 1.7–7.7)
Neutrophils Relative %: 43 %
Platelet Count: 267 10*3/uL (ref 150–400)
RBC: 3.14 MIL/uL — ABNORMAL LOW (ref 3.87–5.11)
RDW: 20.2 % — ABNORMAL HIGH (ref 11.5–15.5)
WBC Count: 4.4 10*3/uL (ref 4.0–10.5)
nRBC: 0 % (ref 0.0–0.2)

## 2020-01-22 LAB — LACTATE DEHYDROGENASE: LDH: 183 U/L (ref 98–192)

## 2020-01-22 NOTE — Telephone Encounter (Signed)
Scheduled per los. Gave avs and calendar  

## 2020-01-22 NOTE — Progress Notes (Signed)
[place] OFFICE PROGRESS NOTE   Diagnosis: WaldenStrom's macroglobulinemia  INTERVAL HISTORY:   [name] returns as scheduled.  She complete another cycle of bendamustine/rituximab beginning 12/26/2019.  She was admitted with rectal bleeding on 01/02/2020.  She was treated for diverticulitis.  Apixaban was placed on hold and resumed 01/12/2020.  Apixaban was resumed at a reduced dose.  She complains of feeling weak.  This limits her ability to ambulate.  No nausea/vomiting, fever, diarrhea, or bleeding.  She has a rash at the left arm with pruritus.   Objective:  Vital signs in last 24 hours:  Blood pressure 123/61, pulse 79, temperature 98.2 F (36.8 C), temperature source Temporal, resp. rate 17, height 5\' 6"  (1.676 m), weight 269 lb 6.4 oz (122.2 kg), SpO2 96 %.    HEENT: No thrush or ulcers Lymphatics: No cervical, supraclavicular, or axillary nodes Resp: Left greater than right end expiratory wheeze, no respiratory distress, good air movement bilaterally Cardio: Regular rate and rhythm GI: Nontender, no hepatosplenomegaly Vascular: No leg edema  Skin: Plaque of erythema at the distal left upper arm    Lab Results:  Lab Results  Component Value Date   WBC 4.4 01/22/2020   HGB 9.3 (L) 01/22/2020   HCT 28.0 (L) 01/22/2020   MCV 89.2 01/22/2020   PLT 267 01/22/2020   NEUTROABS 1.9 01/22/2020    CMP  Lab Results  Component Value Date   NA 143 01/22/2020   K 3.7 01/22/2020   CL 104 01/22/2020   CO2 25 01/22/2020   GLUCOSE 139 (H) 01/22/2020   BUN 17 01/22/2020   CREATININE 1.43 (H) 01/22/2020   CALCIUM 10.0 01/22/2020   PROT 8.5 (H) 01/22/2020   ALBUMIN 3.1 (L) 01/22/2020   AST 20 01/22/2020   ALT 17 01/22/2020   ALKPHOS 68 01/22/2020   BILITOT 0.8 01/22/2020   GFRNONAA 33 (L) 01/22/2020   GFRAA 38 (L) 01/22/2020     Medications: I have reviewed the patient's current medications.   Assessment/Plan: 1. 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 Treated with multiple systemic therapies includingpentostatin/Cytoxan/rituximab in 2010.   Cycle 1 bendamustine/Rituxan 05/31/2014.  Cycle 2 bendamustine/Rituxan 07/02/2014  Cycle 3 bendamustine/rituximab 07/30/2014  Cycle 4 bendamustine/rituximab 09/10/2014  Cycle 5 bendamustine/rituximab 10/22/2014  Cycle 6 bendamustine/rituximab 12/10/2014  PET scan 03/27/2019- diffuse small hypermetabolic lymph nodes in the neck, chest, abdomen, and pelvis, heterogenous hypermetabolism in the bones  CT chest 10/02/2019-bronchiectasis at the lung bases, 13 mm nodular area at the medial left hemidiaphragm, mildly enlarged mediastinal nodes-decreased from June 2020, slightly enlarged left axillary node-9 mm  Rising [name] level March and April 2021  Cycle 1 bendamustine/rituximab 11/27/2019  Cycle 2 bendamustine/Rituxan 12/26/2019 2. Left knee arthritis. Followed by Dr. Wynelle Link. 3. Sleep apnea. 4. Anemia. microcytic with peripheral blood smear findings consistent with iron deficiency 07/09/2015, serum iron studies consistent with "anemia of chronic disease ", stool Hemoccult cards negative 07/11/2015 5. Question left parotitis 12/12/2013 presenting with left facial and parotid edema. 6. Admission with pneumonia February 2016 7. History of Neutropenia secondary to chemotherapy, now with moderate neutropenia 8. Left lung pneumonia 04/02/2015, sputum culture positive for Moraxella 06/19/2015 9. Low IgG and IgA 10. CT chest 05/13/2015 with bilateral lower lung consolidation 11. CT chest, abdomen, and pelvis 01/08/2016-new peribronchial thickening and peripheral tree in bud pattern at the  right lower lobe, no lymphadenopathy 12. Sinus mucosal thickening/opacification on CT 08/16/2015-status post sinus surgery  12/05/2015 13. Sputum culture positive for pseudomonas aeruginosa on 01/10/2016-treated with 10 days of ciprofloxacin 14. 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 Admission with pneumonia 08/26/2016 16.  Left lung nodule increased in size on a CT 01/06/2019, not hypermetabolic on the PET scan 3/00/9233, followed by Dr. Elsworth Soho 17.  Admission with rectal bleeding 01/02/2020-diverticular?    Disposition: Ms. Galloway has completed 2 cycles of bendamustine/rituximab.  She appears to have tolerated the treatment well.  She complains of increased malaise and does not feel that she is able to complete another cycle of treatment today.  The malaise is likely secondary to chemotherapy, anemia, underlying lung disease, deconditioning, and potentially the WaldenStrom's. The hemoglobin is higher today compared to when she left the hospital on 01/06/2020.  She denies recurrent bleeding.  We decided to hold treatment today.  We will follow up on the [name] level.  She will be scheduled for another cycle of chemotherapy on 02/01/2020.  We will consider switching to a different systemic therapy if the [name] has not improved.  [name], MD  01/22/2020  10:05 AM

## 2020-01-23 ENCOUNTER — Telehealth: Payer: Self-pay

## 2020-01-23 ENCOUNTER — Inpatient Hospital Stay: Payer: Medicare Other

## 2020-01-23 LAB — IGM: IgM (Immunoglobulin M), Srm: 4225 mg/dL — ABNORMAL HIGH (ref 26–217)

## 2020-01-23 NOTE — Telephone Encounter (Signed)
Call received from patient to reschedule her appointments from 07/01 and 07/02,2021 to 07/06 and 07/07, 2021 stating her daughter in unable to assist her on previous appt dates message request sent by this nurse to scheduling

## 2020-01-24 ENCOUNTER — Telehealth: Payer: Self-pay | Admitting: *Deleted

## 2020-01-24 ENCOUNTER — Encounter: Payer: Self-pay | Admitting: *Deleted

## 2020-01-24 NOTE — Telephone Encounter (Addendum)
Called patient w/[name] results and since the level is not going down, MD will discuss another therapy at visit on 02/01/20--ibrutinib or acalabrutinib. She agrees to discuss at next visit. Per Dr. Benay Spice: D/C chemo appointments on 7/1 and 7/2

## 2020-02-01 ENCOUNTER — Inpatient Hospital Stay: Payer: Medicare Other

## 2020-02-01 ENCOUNTER — Encounter: Payer: Self-pay | Admitting: Nurse Practitioner

## 2020-02-01 ENCOUNTER — Other Ambulatory Visit: Payer: Self-pay

## 2020-02-01 ENCOUNTER — Telehealth: Payer: Self-pay | Admitting: Nurse Practitioner

## 2020-02-01 ENCOUNTER — Inpatient Hospital Stay: Payer: Medicare Other | Attending: Oncology | Admitting: Nurse Practitioner

## 2020-02-01 VITALS — BP 134/49 | HR 75 | Temp 97.9°F | Resp 18 | Ht 66.0 in | Wt 271.1 lb

## 2020-02-01 DIAGNOSIS — C880 Waldenstrom macroglobulinemia: Secondary | ICD-10-CM | POA: Insufficient documentation

## 2020-02-01 DIAGNOSIS — Z8673 Personal history of transient ischemic attack (TIA), and cerebral infarction without residual deficits: Secondary | ICD-10-CM | POA: Insufficient documentation

## 2020-02-01 DIAGNOSIS — G4730 Sleep apnea, unspecified: Secondary | ICD-10-CM | POA: Insufficient documentation

## 2020-02-01 DIAGNOSIS — Z8572 Personal history of non-Hodgkin lymphomas: Secondary | ICD-10-CM | POA: Diagnosis not present

## 2020-02-01 LAB — CBC WITH DIFFERENTIAL (CANCER CENTER ONLY)
Abs Immature Granulocytes: 0.02 10*3/uL (ref 0.00–0.07)
Basophils Absolute: 0 10*3/uL (ref 0.0–0.1)
Basophils Relative: 1 %
Eosinophils Absolute: 0.1 10*3/uL (ref 0.0–0.5)
Eosinophils Relative: 3 %
HCT: 28.1 % — ABNORMAL LOW (ref 36.0–46.0)
Hemoglobin: 9.5 g/dL — ABNORMAL LOW (ref 12.0–15.0)
Immature Granulocytes: 0 %
Lymphocytes Relative: 39 %
Lymphs Abs: 1.9 10*3/uL (ref 0.7–4.0)
MCH: 31.1 pg (ref 26.0–34.0)
MCHC: 33.8 g/dL (ref 30.0–36.0)
MCV: 92.1 fL (ref 80.0–100.0)
Monocytes Absolute: 0.5 10*3/uL (ref 0.1–1.0)
Monocytes Relative: 10 %
Neutro Abs: 2.3 10*3/uL (ref 1.7–7.7)
Neutrophils Relative %: 47 %
Platelet Count: 322 10*3/uL (ref 150–400)
RBC: 3.05 MIL/uL — ABNORMAL LOW (ref 3.87–5.11)
RDW: 21.6 % — ABNORMAL HIGH (ref 11.5–15.5)
WBC Count: 4.8 10*3/uL (ref 4.0–10.5)
nRBC: 0 % (ref 0.0–0.2)

## 2020-02-01 LAB — SAMPLE TO BLOOD BANK

## 2020-02-01 MED ORDER — ACALABRUTINIB 100 MG PO CAPS: 100.0000 mg | ORAL_CAPSULE | Freq: Two times a day (BID) | ORAL | 0 refills | Status: DC

## 2020-02-01 NOTE — Telephone Encounter (Signed)
Scheduled per 7/1 los. Printed avs and calendar for pt.  

## 2020-02-01 NOTE — Progress Notes (Addendum)
[facility] OFFICE PROGRESS NOTE   Diagnosis: Shea Evans macroglobulinemia  INTERVAL HISTORY:   [name] returns as scheduled.  She has completed 2 cycles of bendamustine/Rituxan.  Cycle 3 was held on 01/22/2020 due to increased malaise.  She continues to note significant fatigue.  Appetite is some better.  No fevers or sweats.  No change in the right inguinal lymph node.  Objective:  Vital signs in last 24 hours:  Blood pressure (!) 134/49, pulse 75, temperature 97.9 F (36.6 C), temperature source Temporal, resp. rate 18, height 5\' 6"  (1.676 m), weight 271 lb 1.6 oz (123 kg), SpO2 97 %.    HEENT: No thrush or ulcers. Lymphatics: No palpable cervical, supraclavicular or axillary lymph nodes.  2 to 3 cm right inguinal lymph node. Resp: Lungs clear bilaterally. Cardio: Regular rate and rhythm. GI: No hepatosplenomegaly. Vascular: No leg edema.    Lab Results:  Lab Results  Component Value Date   WBC 4.8 02/01/2020   HGB 9.5 (L) 02/01/2020   HCT 28.1 (L) 02/01/2020   MCV 92.1 02/01/2020   PLT 322 02/01/2020   NEUTROABS 2.3 02/01/2020    Imaging:  No results found.  Medications: I have reviewed the patient's current medications.  Assessment/Plan: 1. 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 Treated with multiple systemic therapies includingpentostatin/Cytoxan/rituximab in 2010.   Cycle 1 bendamustine/Rituxan 05/31/2014.  Cycle 2 bendamustine/Rituxan 07/02/2014  Cycle 3 bendamustine/rituximab 07/30/2014  Cycle 4 bendamustine/rituximab 09/10/2014  Cycle 5 bendamustine/rituximab 10/22/2014  Cycle 6 bendamustine/rituximab 12/10/2014  PET scan 03/27/2019-diffuse small hypermetabolic lymph nodes in the neck, chest, abdomen, and pelvis, heterogenous hypermetabolism  in the bones  CT chest 10/02/2019-bronchiectasis at the lung bases, 13 mm nodular area at the medial left hemidiaphragm, mildly enlarged mediastinal nodes-decreased from June 2020, slightly enlarged left axillary node-9 mm  Rising [name] level March and April 2021  Cycle 1 bendamustine/rituximab 11/27/2019  Cycle 2 bendamustine/Rituxan 12/26/2019  01/22/2020 [name] 4225 (stable)   Acalabrutinib 02/08/2020 2. Left knee arthritis. Followed by Dr. Wynelle Link. 3. Sleep apnea. 4. Anemia. microcytic with peripheral blood smear findings consistent with iron deficiency 07/09/2015, serum iron studies consistent with "anemia of chronic disease ", stool Hemoccult cards negative 07/11/2015 5. Question left parotitis 12/12/2013 presenting with left facial and parotid edema. 6. Admission with pneumonia February 2016 7. History of Neutropenia secondary to chemotherapy, now with moderate neutropenia 8. Left lung pneumonia 04/02/2015, sputum culture positive for Moraxella 06/19/2015 9. Low IgG and IgA 10. CT chest 05/13/2015 with bilateral lower lung consolidation 11. CT chest, abdomen, and pelvis 01/08/2016-new peribronchial thickening and peripheral tree in bud pattern at the right lower lobe, no lymphadenopathy 12. Sinus mucosal thickening/opacification on CT 08/16/2015-status post sinus surgery 12/05/2015 13. Sputum culture positive for pseudomonas aeruginosa on 01/10/2016-treated with 10 days of ciprofloxacin 14. 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 Admission with pneumonia 08/26/2016 16. Left lung nodule increased in size on a CT 01/06/2019, not hypermetabolic on the PET scan 3/81/8299, followed by Dr. Elsworth Soho 17.  Admission with rectal bleeding 01/02/2020-diverticular?    Disposition: Ms. Allan appears unchanged.  She has completed 2 cycles of bendamustine/Rituxan.  Recent [name] level was unchanged.  Dr. Benay Spice recommends discontinuing bendamustine/Rituxan and beginning acalabrutinib.   We reviewed potential toxicities including rash, diarrhea, bone marrow toxicity, increased risk of bruising and bleeding,  atrial fibrillation/other arrhythmias, increased risk of skin cancers/secondary malignancy, arthralgias.  She agrees to proceed.  We anticipate a start date of 02/08/2020.  She will return for lab and follow-up on 02/22/2020.  She will contact the office in the interim with any problems.  Patient seen with Dr. Benay Spice.    Ned Card ANP/GNP-BC   02/01/2020  9:59 AM This was a shared visit with Ned Card.  Ms. Leiphart has Mingo Amber Strom's macroglobulinemia.  Her clinical status and [name] level have not improved with bendamustine/rituximab.  We discussed treatment options including observation, a BTK inhibitor, and Velcade-based therapy.  I recommend at acalabrutinib.  She agrees to proceed.  We reviewed potential toxicities.  We will consult with the [facility] center pharmacy regarding dosing of the acalabrutinib in the setting of diltiazem therapy.  Julieanne Manson, MD

## 2020-02-02 ENCOUNTER — Telehealth: Payer: Self-pay

## 2020-02-02 ENCOUNTER — Ambulatory Visit: Payer: Medicare Other

## 2020-02-02 NOTE — Telephone Encounter (Signed)
Oral Oncology Patient Advocate Encounter  Received notification from [employer] that prior authorization for Calquence is required.  PA submitted on CoverMyMeds Key [id number] Status is pending  Oral Oncology Clinic will continue to follow.  [place] Patient [place] Phone [phone] Fax [phone] 02/02/2020 2:13 PM

## 2020-02-06 ENCOUNTER — Telehealth: Payer: Self-pay | Admitting: Pharmacist

## 2020-02-06 DIAGNOSIS — C8800 Waldenstrom macroglobulinemia not having achieved remission: Secondary | ICD-10-CM

## 2020-02-06 MED ORDER — ACALABRUTINIB 100 MG PO CAPS: 100.0000 mg | ORAL_CAPSULE | Freq: Every day | ORAL | 0 refills | Status: DC

## 2020-02-06 NOTE — Telephone Encounter (Signed)
Oral Oncology Pharmacist Encounter  Received new prescription for Calquence (acalabrutinib) for the treatment of waldenstrom's macroglobulinemia, planned duration until disease progression or unacceptable drug toxicity.  CMP from 01/22/20 assessed, no relevant lab abnormalities. Prescription dose and frequency assessed.   Current medication list in Epic reviewed, a few DDIs with acalabrutinib identified: -Calcium carbonate (antacids): antacid may decrease the concentration of acalabrutinib. Recommend separating administration of acalabrutinib from the administration of any antacids by at least 2 hours. -Diltiazem: Diltiazem may increase the concentration of acalabrutinib. The recommendation is to dose reduce the acalabrutinib to 100mg  daily for concurrent use with diltiazem. Prescription dose decreased to 100mg  daily.  Prescription has been e-scribed to the [facility] for benefits analysis and approval.  Oral Oncology Clinic will continue to follow for insurance authorization, copayment issues, initial counseling and start date.  Darl Pikes, PharmD, BCPS, BCOP, CPP Hematology/Oncology Clinical Pharmacist Practitioner [facility]/HP/AP [facility] [phone]  02/06/2020 9:16 AM

## 2020-02-06 NOTE — Telephone Encounter (Signed)
Oral Oncology Patient Advocate Encounter  Prior Authorization for Calquence has been approved.    PA# [id number] Effective dates: 02/02/20 through 08/02/20  Patients co-pay is $1902.12  Oral Oncology Clinic will continue to follow.   [place] Patient [place] Phone [phone] Fax [phone] 02/06/2020 8:26 AM

## 2020-02-07 ENCOUNTER — Telehealth: Payer: Self-pay | Admitting: *Deleted

## 2020-02-07 NOTE — Telephone Encounter (Signed)
Called to speak w/MD regarding treatment decision. She is wishing to discuss a return to a prior treatment that she received ~ 2010 per Dr. Marcell Anger that worked for 6 years instead of the acalabrutinib pills.

## 2020-02-16 ENCOUNTER — Other Ambulatory Visit: Payer: Medicare Other

## 2020-02-16 ENCOUNTER — Ambulatory Visit: Payer: Medicare Other | Admitting: Oncology

## 2020-02-23 ENCOUNTER — Other Ambulatory Visit: Payer: Self-pay

## 2020-02-23 ENCOUNTER — Inpatient Hospital Stay: Payer: Medicare Other

## 2020-02-23 ENCOUNTER — Inpatient Hospital Stay (HOSPITAL_BASED_OUTPATIENT_CLINIC_OR_DEPARTMENT_OTHER): Payer: Medicare Other | Admitting: Oncology

## 2020-02-23 ENCOUNTER — Telehealth: Payer: Self-pay | Admitting: Oncology

## 2020-02-23 VITALS — BP 126/64 | HR 72 | Temp 97.7°F | Resp 19 | Ht 66.0 in | Wt 270.4 lb

## 2020-02-23 DIAGNOSIS — G4730 Sleep apnea, unspecified: Secondary | ICD-10-CM | POA: Diagnosis not present

## 2020-02-23 DIAGNOSIS — Z8673 Personal history of transient ischemic attack (TIA), and cerebral infarction without residual deficits: Secondary | ICD-10-CM | POA: Diagnosis not present

## 2020-02-23 DIAGNOSIS — Z8572 Personal history of non-Hodgkin lymphomas: Secondary | ICD-10-CM | POA: Diagnosis not present

## 2020-02-23 DIAGNOSIS — C880 Waldenstrom macroglobulinemia: Secondary | ICD-10-CM

## 2020-02-23 LAB — CMP (CANCER CENTER ONLY)
ALT: 14 U/L (ref 0–44)
AST: 19 U/L (ref 15–41)
Albumin: 3.1 g/dL — ABNORMAL LOW (ref 3.5–5.0)
Alkaline Phosphatase: 72 U/L (ref 38–126)
Anion gap: 14 (ref 5–15)
BUN: 19 mg/dL (ref 8–23)
CO2: 25 mmol/L (ref 22–32)
Calcium: 10 mg/dL (ref 8.9–10.3)
Chloride: 104 mmol/L (ref 98–111)
Creatinine: 1.31 mg/dL — ABNORMAL HIGH (ref 0.44–1.00)
GFR, Est AFR Am: 43 mL/min — ABNORMAL LOW (ref >60)
GFR, Estimated: 37 mL/min — ABNORMAL LOW (ref >60)
Glucose, Bld: 106 mg/dL — ABNORMAL HIGH (ref 70–99)
Potassium: 3.7 mmol/L (ref 3.5–5.1)
Sodium: 143 mmol/L (ref 135–145)
Total Bilirubin: 0.7 mg/dL (ref 0.3–1.2)
Total Protein: 8.7 g/dL — ABNORMAL HIGH (ref 6.5–8.1)

## 2020-02-23 LAB — CBC WITH DIFFERENTIAL (CANCER CENTER ONLY)
Abs Immature Granulocytes: 0.01 10*3/uL (ref 0.00–0.07)
Basophils Absolute: 0 10*3/uL (ref 0.0–0.1)
Basophils Relative: 1 %
Eosinophils Absolute: 0.1 10*3/uL (ref 0.0–0.5)
Eosinophils Relative: 2 %
HCT: 26.4 % — ABNORMAL LOW (ref 36.0–46.0)
Hemoglobin: 9.6 g/dL — ABNORMAL LOW (ref 12.0–15.0)
Immature Granulocytes: 0 %
Lymphocytes Relative: 37 %
Lymphs Abs: 1.5 10*3/uL (ref 0.7–4.0)
MCH: 35 pg — ABNORMAL HIGH (ref 26.0–34.0)
MCHC: 36.4 g/dL — ABNORMAL HIGH (ref 30.0–36.0)
MCV: 96.4 fL (ref 80.0–100.0)
Monocytes Absolute: 0.4 10*3/uL (ref 0.1–1.0)
Monocytes Relative: 10 %
Neutro Abs: 2 10*3/uL (ref 1.7–7.7)
Neutrophils Relative %: 50 %
Platelet Count: 302 10*3/uL (ref 150–400)
RBC: 2.74 MIL/uL — ABNORMAL LOW (ref 3.87–5.11)
RDW: 23.5 % — ABNORMAL HIGH (ref 11.5–15.5)
WBC Count: 4 10*3/uL (ref 4.0–10.5)
nRBC: 0 % (ref 0.0–0.2)

## 2020-02-23 NOTE — Progress Notes (Signed)
[place] OFFICE PROGRESS NOTE   Diagnosis: Natasha Ellis  INTERVAL HISTORY:   Natasha Ellis returns for scheduled visit.  She is here with her daughter.  She reports an improved energy level compared to when she was here earlier this month.  I spoke to her on the telephone several weeks ago.  She indicated that she does not wish to begin salvage systemic therapy until after her sister visits in September.  Objective:  Vital signs in last 24 hours:  Blood pressure (!) 126/64, pulse 72, temperature 97.7 F (36.5 C), temperature source Temporal, resp. rate 19, height 5\' 6"  (1.676 m), weight (!) 270 lb 6.4 oz (122.7 kg), SpO2 98 %.    Lymphatics: No cervical, supraclavicular, or axillary nodes.  3-4 cm firm right inguinal node Resp: Scattered mild wheeze, no respiratory distress Cardio: Regular rate and rhythm GI: No hepatosplenomegaly Vascular: No leg edema     Lab Results:  Lab Results  Component Value Date   WBC 4.0 02/23/2020   HGB 9.6 (L) 02/23/2020   HCT 26.4 (L) 02/23/2020   MCV 96.4 02/23/2020   PLT 302 02/23/2020   NEUTROABS 2.0 02/23/2020    CMP  Lab Results  Component Value Date   NA 143 02/23/2020   K 3.7 02/23/2020   CL 104 02/23/2020   CO2 25 02/23/2020   GLUCOSE 106 (H) 02/23/2020   BUN 19 02/23/2020   CREATININE 1.31 (H) 02/23/2020   CALCIUM 10.0 02/23/2020   PROT 8.7 (H) 02/23/2020   ALBUMIN 3.1 (L) 02/23/2020   AST 19 02/23/2020   ALT 14 02/23/2020   ALKPHOS 72 02/23/2020   BILITOT 0.7 02/23/2020   GFRNONAA 37 (L) 02/23/2020   GFRAA 43 (L) 02/23/2020      Medications: I have reviewed the patient's current medications.   Assessment/Plan: 1. 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 Treated with multiple systemic therapies  includingpentostatin/Cytoxan/rituximab in 2010.   Cycle 1 bendamustine/Rituxan 05/31/2014.  Cycle 2 bendamustine/Rituxan 07/02/2014  Cycle 3 bendamustine/rituximab 07/30/2014  Cycle 4 bendamustine/rituximab 09/10/2014  Cycle 5 bendamustine/rituximab 10/22/2014  Cycle 6 bendamustine/rituximab 12/10/2014  PET scan 03/27/2019-diffuse small hypermetabolic lymph nodes in the neck, chest, abdomen, and pelvis, heterogenous hypermetabolism in the bones  CT chest 10/02/2019-bronchiectasis at the lung bases, 13 mm nodular area at the medial left hemidiaphragm, mildly enlarged mediastinal nodes-decreased from June 2020, slightly enlarged left axillary node-9 mm  Rising [name] level March and April 2021  Cycle 1 bendamustine/rituximab 11/27/2019  Cycle 2 bendamustine/Rituxan 12/26/2019  01/22/2020 [name] 4225 (stable)   Acalabrutinib 02/08/2020 2. Left knee arthritis. Followed by Dr. Wynelle Link. 3. Sleep apnea. 4. Anemia. microcytic with peripheral blood smear findings consistent with iron deficiency 07/09/2015, serum iron studies consistent with "anemia of chronic disease ", stool Hemoccult cards negative 07/11/2015 5. Question left parotitis 12/12/2013 presenting with left facial and parotid edema. 6. Admission with pneumonia February 2016 7. History of Neutropenia secondary to chemotherapy, now with moderate neutropenia 8. Left lung pneumonia 04/02/2015, sputum culture positive for Moraxella 06/19/2015 9. Low IgG and IgA 10. CT chest 05/13/2015 with bilateral lower lung consolidation 11. CT chest, abdomen, and pelvis 01/08/2016-new peribronchial thickening and peripheral tree in bud pattern at the right lower lobe, no lymphadenopathy 12. Sinus mucosal thickening/opacification on CT 08/16/2015-status post sinus surgery 12/05/2015 13. Sputum culture positive for pseudomonas aeruginosa  on 01/10/2016-treated with 10 days of ciprofloxacin 14. Right centrum semi-ovale CVA confirmed on MRI August  2017 15. Admission with pneumonia 08/26/2016 16. Left lung nodule increased in size on a CT 01/06/2019, not hypermetabolic on the PET scan 08/14/1622, followed by Dr. Elsworth Soho 17.  Admission with rectal bleeding 01/02/2020-diverticular?   Disposition: Natasha Ellis appears unchanged.  She reports an improved energy level.  There is no clinical evidence for progression of the WaldenStrom's Ellis.  We discussed salvage treatment options.  She would like to be treated with the pentostatin, cyclophosphamide, and rituximab regimen she received in 2010.  She would like to wait on beginning treatment until after her sister visits in September.  Natasha Ellis will return for an office and lab visit during the week of 04/08/2020.  She will call in the interim for new symptoms.  We are attempting to obtain her treatment records from [place]. Natasha Coder, MD  02/23/2020  10:43 AM

## 2020-02-23 NOTE — Telephone Encounter (Signed)
Scheduled per 07/23 los, patient received updated calender.

## 2020-03-07 ENCOUNTER — Encounter: Payer: Self-pay | Admitting: *Deleted

## 2020-03-18 ENCOUNTER — Telehealth: Payer: Self-pay

## 2020-03-18 ENCOUNTER — Ambulatory Visit
Admission: RE | Admit: 2020-03-18 | Discharge: 2020-03-18 | Disposition: A | Discharge status: Home / Self Care | Payer: Medicare Other | Source: Ambulatory Visit | Attending: Pulmonary Disease | Admitting: Pulmonary Disease

## 2020-03-18 ENCOUNTER — Other Ambulatory Visit: Payer: Self-pay

## 2020-03-18 DIAGNOSIS — I700 Atherosclerosis of aorta: Secondary | ICD-10-CM | POA: Diagnosis not present

## 2020-03-18 DIAGNOSIS — I2510 Atherosclerotic heart disease of native coronary artery without angina pectoris: Secondary | ICD-10-CM | POA: Diagnosis not present

## 2020-03-18 DIAGNOSIS — D1779 Benign lipomatous neoplasm of other sites: Secondary | ICD-10-CM | POA: Diagnosis not present

## 2020-03-18 DIAGNOSIS — J479 Bronchiectasis, uncomplicated: Secondary | ICD-10-CM | POA: Diagnosis not present

## 2020-03-18 DIAGNOSIS — D869 Sarcoidosis, unspecified: Secondary | ICD-10-CM

## 2020-03-18 NOTE — Telephone Encounter (Signed)
Patient called in to have only lab appointment changed from 04/11/20 to 04/05/20. Sent message to scheduling. Asked scheduling to reach out to patient with new appointment time.

## 2020-03-19 ENCOUNTER — Telehealth: Payer: Self-pay | Admitting: Oncology

## 2020-03-19 NOTE — Telephone Encounter (Signed)
Rescheduled appointment per 8/16 scheduling message. Patient is aware of updated appointment date and time.

## 2020-03-28 ENCOUNTER — Ambulatory Visit (HOSPITAL_COMMUNITY): Payer: Medicare Other

## 2020-04-02 ENCOUNTER — Ambulatory Visit
Admission: RE | Admit: 2020-04-02 | Discharge: 2020-04-02 | Disposition: A | Discharge status: Home / Self Care | Payer: Medicare Other | Source: Ambulatory Visit | Attending: Physician Assistant | Admitting: Physician Assistant

## 2020-04-02 ENCOUNTER — Other Ambulatory Visit: Payer: Self-pay | Admitting: Physician Assistant

## 2020-04-02 DIAGNOSIS — M19031 Primary osteoarthritis, right wrist: Secondary | ICD-10-CM | POA: Diagnosis not present

## 2020-04-02 DIAGNOSIS — R52 Pain, unspecified: Secondary | ICD-10-CM

## 2020-04-02 DIAGNOSIS — M19041 Primary osteoarthritis, right hand: Secondary | ICD-10-CM | POA: Diagnosis not present

## 2020-04-02 DIAGNOSIS — M545 Low back pain: Secondary | ICD-10-CM | POA: Diagnosis not present

## 2020-04-02 DIAGNOSIS — M79644 Pain in right finger(s): Secondary | ICD-10-CM | POA: Diagnosis not present

## 2020-04-05 ENCOUNTER — Other Ambulatory Visit: Payer: Self-pay

## 2020-04-05 ENCOUNTER — Inpatient Hospital Stay: Payer: Medicare Other | Attending: Oncology

## 2020-04-05 DIAGNOSIS — G4730 Sleep apnea, unspecified: Secondary | ICD-10-CM | POA: Diagnosis not present

## 2020-04-05 DIAGNOSIS — D649 Anemia, unspecified: Secondary | ICD-10-CM | POA: Insufficient documentation

## 2020-04-05 DIAGNOSIS — C880 Waldenstrom macroglobulinemia: Secondary | ICD-10-CM | POA: Diagnosis not present

## 2020-04-05 DIAGNOSIS — Z8572 Personal history of non-Hodgkin lymphomas: Secondary | ICD-10-CM | POA: Diagnosis not present

## 2020-04-05 DIAGNOSIS — Z8673 Personal history of transient ischemic attack (TIA), and cerebral infarction without residual deficits: Secondary | ICD-10-CM | POA: Insufficient documentation

## 2020-04-05 DIAGNOSIS — Z9221 Personal history of antineoplastic chemotherapy: Secondary | ICD-10-CM | POA: Insufficient documentation

## 2020-04-05 LAB — CMP (CANCER CENTER ONLY)
ALT: 9 U/L (ref 0–44)
AST: 17 U/L (ref 15–41)
Albumin: 3.1 g/dL — ABNORMAL LOW (ref 3.5–5.0)
Alkaline Phosphatase: 100 U/L (ref 38–126)
Anion gap: 9 (ref 5–15)
BUN: 19 mg/dL (ref 8–23)
CO2: 27 mmol/L (ref 22–32)
Calcium: 10.3 mg/dL (ref 8.9–10.3)
Chloride: 103 mmol/L (ref 98–111)
Creatinine: 1.27 mg/dL — ABNORMAL HIGH (ref 0.44–1.00)
GFR, Est AFR Am: 44 mL/min — ABNORMAL LOW (ref >60)
GFR, Estimated: 38 mL/min — ABNORMAL LOW (ref >60)
Glucose, Bld: 91 mg/dL (ref 70–99)
Potassium: 4.1 mmol/L (ref 3.5–5.1)
Sodium: 139 mmol/L (ref 135–145)
Total Bilirubin: 0.7 mg/dL (ref 0.3–1.2)
Total Protein: 9.7 g/dL — ABNORMAL HIGH (ref 6.5–8.1)

## 2020-04-05 LAB — CBC WITH DIFFERENTIAL (CANCER CENTER ONLY)
Abs Immature Granulocytes: 0.03 10*3/uL (ref 0.00–0.07)
Basophils Absolute: 0 10*3/uL (ref 0.0–0.1)
Basophils Relative: 1 %
Eosinophils Absolute: 0.2 10*3/uL (ref 0.0–0.5)
Eosinophils Relative: 3 %
HCT: 26.1 % — ABNORMAL LOW (ref 36.0–46.0)
Hemoglobin: 9.6 g/dL — ABNORMAL LOW (ref 12.0–15.0)
Immature Granulocytes: 1 %
Lymphocytes Relative: 20 %
Lymphs Abs: 1.1 10*3/uL (ref 0.7–4.0)
MCH: 35 pg — ABNORMAL HIGH (ref 26.0–34.0)
MCHC: 36.8 g/dL — ABNORMAL HIGH (ref 30.0–36.0)
MCV: 95.3 fL (ref 80.0–100.0)
Monocytes Absolute: 0.4 10*3/uL (ref 0.1–1.0)
Monocytes Relative: 8 %
Neutro Abs: 3.7 10*3/uL (ref 1.7–7.7)
Neutrophils Relative %: 67 %
Platelet Count: 397 10*3/uL (ref 150–400)
RBC: 2.74 MIL/uL — ABNORMAL LOW (ref 3.87–5.11)
RDW: 21.1 % — ABNORMAL HIGH (ref 11.5–15.5)
WBC Count: 5.4 10*3/uL (ref 4.0–10.5)
nRBC: 0 % (ref 0.0–0.2)

## 2020-04-06 LAB — IGM: IgM (Immunoglobulin M), Srm: 5608 mg/dL — ABNORMAL HIGH (ref 26–217)

## 2020-04-09 LAB — PROTEIN ELECTROPHORESIS, SERUM
A/G Ratio: 0.7 (ref 0.7–1.7)
Albumin ELP: 3.6 g/dL (ref 2.9–4.4)
Alpha-1-Globulin: 0.3 g/dL (ref 0.0–0.4)
Alpha-2-Globulin: 0.8 g/dL (ref 0.4–1.0)
Beta Globulin: 0.7 g/dL (ref 0.7–1.3)
Gamma Globulin: 3.6 g/dL — ABNORMAL HIGH (ref 0.4–1.8)
Globulin, Total: 5.5 g/dL — ABNORMAL HIGH (ref 2.2–3.9)
M-Spike, %: 3.4 g/dL — ABNORMAL HIGH
Total Protein ELP: 9.1 g/dL — ABNORMAL HIGH (ref 6.0–8.5)

## 2020-04-11 ENCOUNTER — Other Ambulatory Visit: Payer: Medicare Other

## 2020-04-11 ENCOUNTER — Inpatient Hospital Stay (HOSPITAL_BASED_OUTPATIENT_CLINIC_OR_DEPARTMENT_OTHER): Payer: Medicare Other | Admitting: Oncology

## 2020-04-11 ENCOUNTER — Other Ambulatory Visit: Payer: Self-pay

## 2020-04-11 VITALS — BP 117/54 | HR 70 | Temp 97.0°F | Resp 17 | Ht 66.0 in

## 2020-04-11 DIAGNOSIS — Z9221 Personal history of antineoplastic chemotherapy: Secondary | ICD-10-CM | POA: Diagnosis not present

## 2020-04-11 DIAGNOSIS — Z8572 Personal history of non-Hodgkin lymphomas: Secondary | ICD-10-CM | POA: Diagnosis not present

## 2020-04-11 DIAGNOSIS — G4730 Sleep apnea, unspecified: Secondary | ICD-10-CM | POA: Diagnosis not present

## 2020-04-11 DIAGNOSIS — D649 Anemia, unspecified: Secondary | ICD-10-CM | POA: Diagnosis not present

## 2020-04-11 DIAGNOSIS — C880 Waldenstrom macroglobulinemia: Secondary | ICD-10-CM | POA: Diagnosis not present

## 2020-04-11 DIAGNOSIS — Z8673 Personal history of transient ischemic attack (TIA), and cerebral infarction without residual deficits: Secondary | ICD-10-CM | POA: Diagnosis not present

## 2020-04-11 NOTE — Progress Notes (Signed)
[place] OFFICE PROGRESS NOTE   Diagnosis: WaldenStrom's macroglobulinemia  INTERVAL HISTORY:   Ms. Nery returns as scheduled.  She is here today with her daughter.  She developed acute low back pain a few weeks ago.  She was seen by her primary provider and diagnosed with a "muscle pull ".  She also has tendinitis at the right wrist.  No fever or night sweats.  Intermittent cough.  Objective:  Vital signs in last 24 hours:  Blood pressure (!) 117/54, pulse 70, temperature (!) 97 F (36.1 C), resp. rate 17, height 5\' 6"  (1.676 m), SpO2 96 %.    Lymphatics: No cervical, supraclavicular, or axillary nodes Resp: Inspiratory rhonchi at the lower posterior chest bilaterally, no respiratory distress Cardio: Regular rate and rhythm GI: No hepatosplenomegaly, nontender Vascular: No leg edema Neuro: She moves both legs and feet Musculoskeletal: No spine tenderness, no pain with motion of the left hip    Lab Results:  Lab Results  Component Value Date   WBC 5.4 04/05/2020   HGB 9.6 (L) 04/05/2020   HCT 26.1 (L) 04/05/2020   MCV 95.3 04/05/2020   PLT 397 04/05/2020   NEUTROABS 3.7 04/05/2020    CMP  Lab Results  Component Value Date   NA 139 04/05/2020   K 4.1 04/05/2020   CL 103 04/05/2020   CO2 27 04/05/2020   GLUCOSE 91 04/05/2020   BUN 19 04/05/2020   CREATININE 1.27 (H) 04/05/2020   CALCIUM 10.3 04/05/2020   PROT 9.7 (H) 04/05/2020   ALBUMIN 3.1 (L) 04/05/2020   AST 17 04/05/2020   ALT 9 04/05/2020   ALKPHOS 100 04/05/2020   BILITOT 0.7 04/05/2020   GFRNONAA 38 (L) 04/05/2020   GFRAA 44 (L) 04/05/2020  [name] 5608, serum M spike 3.4 Medications: I have reviewed the patient's current medications.   Assessment/Plan:  1. 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  Treated with multiple systemic therapies including pentostatin/Cytoxan/rituximab in 2010.   Cycle 1 bendamustine/Rituxan 05/31/2014.  Cycle 2 bendamustine/Rituxan 07/02/2014  Cycle 3 bendamustine/rituximab 07/30/2014  Cycle 4 bendamustine/rituximab 09/10/2014  Cycle 5 bendamustine/rituximab 10/22/2014  Cycle 6 bendamustine/rituximab 12/10/2014  PET scan 03/27/2019-diffuse small hypermetabolic lymph nodes in the neck, chest, abdomen, and pelvis, heterogenous hypermetabolism in the bones  CT chest 10/02/2019-bronchiectasis at the lung bases, 13 mm nodular area at the medial left hemidiaphragm, mildly enlarged mediastinal nodes-decreased from June 2020, slightly enlarged left axillary node-9 mm  Rising [name] level March and April 2021  Cycle 1 bendamustine/rituximab 11/27/2019  Cycle 2 bendamustine/Rituxan 12/26/2019  01/22/2020 [name] 4225 (stable)   Acalabrutinib 02/08/2020 2. Left knee arthritis. Followed by Dr. Wynelle Link. 3. Sleep apnea. 4. Anemia. microcytic with peripheral blood smear findings consistent with iron deficiency 07/09/2015, serum iron studies consistent with "anemia of chronic disease ", stool Hemoccult cards negative 07/11/2015 5. Question left parotitis 12/12/2013 presenting with left facial and parotid edema. 6. Admission with pneumonia February 2016 7. History of Neutropenia secondary to chemotherapy, now with moderate neutropenia 8. Left lung pneumonia 04/02/2015, sputum culture positive for Moraxella 06/19/2015 9. Low IgG and IgA 10. CT chest 05/13/2015 with bilateral lower lung consolidation 11. CT chest, abdomen, and pelvis 01/08/2016-new peribronchial thickening and peripheral tree in bud pattern at the right lower lobe, no lymphadenopathy 12. Sinus mucosal thickening/opacification on CT 08/16/2015-status post sinus surgery 12/05/2015 13.  Sputum culture positive for pseudomonas aeruginosa on 01/10/2016-treated with 10 days of ciprofloxacin 14. Right centrum  semi-ovale CVA confirmed on MRI August 2017 15. Admission with pneumonia 08/26/2016 16. Left lung nodule increased in size on a CT 01/06/2019, not hypermetabolic on the PET scan 04/26/4143, followed by Dr. Elsworth Soho 17.  Admission with rectal bleeding 01/02/2020-diverticular?    Disposition: Ms. Towell has a longstanding history of low-grade non-Hodgkin's lipoma, most consistent with a diagnosis of lymphoplasmacytic lymphoma.  The [name] level is increasing and she is anemic.  We discussed treatment options.  She would like to proceed with systemic therapy as opposed to observation.  I suspect her back pain is unrelated to the lymphoma diagnosis.  We discussed treatment with Velcade/rituximab.  We also discussed ibrutinib.  She prefers to be treated with the pentostatin, cyclophosphamide, and rituximab regimen she received in [place] many years ago.  We have been unable to obtain these records despite multiple attempts.  We discussed potential toxicities associated with this regimen including the chance of nausea/vomiting, rash, diarrhea, and hematologic toxicity.  She agrees to proceed.  Her sister is visiting for the next 2 weeks and she would like to begin treatment on 05/01/2020.  Ms. Robleto will obtain a COVID-19 booster vaccine prior to beginning treatment.  [name], MD  04/11/2020  11:38 AM

## 2020-04-11 NOTE — Progress Notes (Signed)
DISCONTINUE ON PATHWAY REGIMEN - Lymphoma and CLL     A cycle is every 28 days:     Bendamustine      Rituximab-xxxx   **Always confirm dose/schedule in your pharmacy ordering system**  REASON: Disease Progression PRIOR TREATMENT: [id number]: Bendamustine + Rituximab IV (90/375) q28 Days TREATMENT RESPONSE: Progressive Disease (PD)  START OFF PATHWAY REGIMEN - Lymphoma and CLL   [id number]:R-CVP q21 days:   A cycle is every 21 days:     Rituximab-xxxx      Cyclophosphamide      Vincristine      Prednisone   **Always confirm dose/schedule in your pharmacy ordering system**  Patient Characteristics: [place] Macroglobulinemia/Lymphoplasmacytic Lymphoma, Symptomatic, Third Line, No Prior Ibrutinib Disease Type: [place] Macroglobulinemia/Lymphoplasmacytic Lymphoma Disease Type: Not Applicable Disease Type: Not Applicable Asymptomatic or Symptomatic<= Symptomatic Line of Therapy: Third Line Prior Treatment: No Prior Ibrutinib Intent of Therapy: Non-Curative / Palliative Intent, Discussed with Patient

## 2020-04-12 ENCOUNTER — Telehealth: Payer: Self-pay | Admitting: Oncology

## 2020-04-12 NOTE — Telephone Encounter (Signed)
Scheduled appointments per 9/9 los. Patient is aware of appointment date and times.

## 2020-04-15 ENCOUNTER — Telehealth: Payer: Self-pay | Admitting: *Deleted

## 2020-04-15 DIAGNOSIS — M545 Low back pain: Secondary | ICD-10-CM | POA: Diagnosis not present

## 2020-04-15 DIAGNOSIS — R279 Unspecified lack of coordination: Secondary | ICD-10-CM | POA: Diagnosis not present

## 2020-04-15 DIAGNOSIS — A509 Congenital syphilis, unspecified: Secondary | ICD-10-CM | POA: Diagnosis not present

## 2020-04-15 DIAGNOSIS — M6281 Muscle weakness (generalized): Secondary | ICD-10-CM | POA: Diagnosis not present

## 2020-04-15 DIAGNOSIS — C880 Waldenstrom macroglobulinemia: Secondary | ICD-10-CM | POA: Diagnosis not present

## 2020-04-15 NOTE — Telephone Encounter (Signed)
They recommend [place] therapy/electrical stimulation therapy for her pain management and are requesting Dr. Benay Spice fax order to [phone]. Called back and made [facility] aware that Dr. Benay Spice would like to defer this order to her PCP>

## 2020-04-16 NOTE — Telephone Encounter (Signed)
Patient called asking for status of orders for the machine physical therapy suggested for her pain control. Informed her that Dr. Benay Spice wants her PCP to manage this and physical therapy was informed of this yesterday. She will reach out to her PCP.

## 2020-04-17 ENCOUNTER — Telehealth: Payer: Self-pay | Admitting: Oncology

## 2020-04-17 ENCOUNTER — Telehealth: Payer: Self-pay | Admitting: *Deleted

## 2020-04-17 DIAGNOSIS — R279 Unspecified lack of coordination: Secondary | ICD-10-CM | POA: Diagnosis not present

## 2020-04-17 DIAGNOSIS — M545 Low back pain: Secondary | ICD-10-CM | POA: Diagnosis not present

## 2020-04-17 DIAGNOSIS — C880 Waldenstrom macroglobulinemia: Secondary | ICD-10-CM | POA: Diagnosis not present

## 2020-04-17 DIAGNOSIS — M6281 Muscle weakness (generalized): Secondary | ICD-10-CM | POA: Diagnosis not present

## 2020-04-17 DIAGNOSIS — A509 Congenital syphilis, unspecified: Secondary | ICD-10-CM | POA: Diagnosis not present

## 2020-04-17 NOTE — Telephone Encounter (Signed)
R/s appt per 9/15 sch msg- pt daughter aware.

## 2020-04-17 NOTE — Telephone Encounter (Signed)
Daughter asking if would be OK to push back her treatment till the week of 05/06/20 after patient's sister leaves from visiting. OK per Dr. Benay Spice. Scheduling message sent requesting the change.

## 2020-04-19 DIAGNOSIS — C880 Waldenstrom macroglobulinemia: Secondary | ICD-10-CM | POA: Diagnosis not present

## 2020-04-19 DIAGNOSIS — Z23 Encounter for immunization: Secondary | ICD-10-CM | POA: Diagnosis not present

## 2020-04-19 DIAGNOSIS — M6281 Muscle weakness (generalized): Secondary | ICD-10-CM | POA: Diagnosis not present

## 2020-04-19 DIAGNOSIS — A509 Congenital syphilis, unspecified: Secondary | ICD-10-CM | POA: Diagnosis not present

## 2020-04-19 DIAGNOSIS — R279 Unspecified lack of coordination: Secondary | ICD-10-CM | POA: Diagnosis not present

## 2020-04-19 DIAGNOSIS — M545 Low back pain: Secondary | ICD-10-CM | POA: Diagnosis not present

## 2020-04-22 DIAGNOSIS — A509 Congenital syphilis, unspecified: Secondary | ICD-10-CM | POA: Diagnosis not present

## 2020-04-22 DIAGNOSIS — C880 Waldenstrom macroglobulinemia: Secondary | ICD-10-CM | POA: Diagnosis not present

## 2020-04-22 DIAGNOSIS — R279 Unspecified lack of coordination: Secondary | ICD-10-CM | POA: Diagnosis not present

## 2020-04-22 DIAGNOSIS — M6281 Muscle weakness (generalized): Secondary | ICD-10-CM | POA: Diagnosis not present

## 2020-04-22 DIAGNOSIS — M545 Low back pain: Secondary | ICD-10-CM | POA: Diagnosis not present

## 2020-04-24 DIAGNOSIS — R279 Unspecified lack of coordination: Secondary | ICD-10-CM | POA: Diagnosis not present

## 2020-04-24 DIAGNOSIS — M545 Low back pain: Secondary | ICD-10-CM | POA: Diagnosis not present

## 2020-04-24 DIAGNOSIS — A509 Congenital syphilis, unspecified: Secondary | ICD-10-CM | POA: Diagnosis not present

## 2020-04-24 DIAGNOSIS — M6281 Muscle weakness (generalized): Secondary | ICD-10-CM | POA: Diagnosis not present

## 2020-04-24 DIAGNOSIS — C880 Waldenstrom macroglobulinemia: Secondary | ICD-10-CM | POA: Diagnosis not present

## 2020-04-26 DIAGNOSIS — M6281 Muscle weakness (generalized): Secondary | ICD-10-CM | POA: Diagnosis not present

## 2020-04-26 DIAGNOSIS — R279 Unspecified lack of coordination: Secondary | ICD-10-CM | POA: Diagnosis not present

## 2020-04-26 DIAGNOSIS — M545 Low back pain: Secondary | ICD-10-CM | POA: Diagnosis not present

## 2020-04-26 DIAGNOSIS — R05 Cough: Secondary | ICD-10-CM | POA: Diagnosis not present

## 2020-04-26 DIAGNOSIS — M79602 Pain in left arm: Secondary | ICD-10-CM | POA: Diagnosis not present

## 2020-04-26 DIAGNOSIS — A509 Congenital syphilis, unspecified: Secondary | ICD-10-CM | POA: Diagnosis not present

## 2020-04-26 DIAGNOSIS — M778 Other enthesopathies, not elsewhere classified: Secondary | ICD-10-CM | POA: Diagnosis not present

## 2020-04-26 DIAGNOSIS — C880 Waldenstrom macroglobulinemia: Secondary | ICD-10-CM | POA: Diagnosis not present

## 2020-04-29 ENCOUNTER — Ambulatory Visit: Payer: Medicare Other | Admitting: Oncology

## 2020-04-29 ENCOUNTER — Other Ambulatory Visit: Payer: Medicare Other

## 2020-04-29 ENCOUNTER — Ambulatory Visit: Payer: Medicare Other

## 2020-04-29 DIAGNOSIS — R279 Unspecified lack of coordination: Secondary | ICD-10-CM | POA: Diagnosis not present

## 2020-04-29 DIAGNOSIS — M545 Low back pain: Secondary | ICD-10-CM | POA: Diagnosis not present

## 2020-04-29 DIAGNOSIS — C880 Waldenstrom macroglobulinemia: Secondary | ICD-10-CM | POA: Diagnosis not present

## 2020-04-29 DIAGNOSIS — A509 Congenital syphilis, unspecified: Secondary | ICD-10-CM | POA: Diagnosis not present

## 2020-04-29 DIAGNOSIS — M6281 Muscle weakness (generalized): Secondary | ICD-10-CM | POA: Diagnosis not present

## 2020-04-30 ENCOUNTER — Other Ambulatory Visit: Payer: Medicare Other

## 2020-04-30 ENCOUNTER — Ambulatory Visit: Payer: Medicare Other

## 2020-04-30 ENCOUNTER — Ambulatory Visit: Payer: Medicare Other | Admitting: Oncology

## 2020-05-01 ENCOUNTER — Other Ambulatory Visit: Payer: Medicare Other

## 2020-05-01 ENCOUNTER — Ambulatory Visit: Payer: Medicare Other

## 2020-05-01 DIAGNOSIS — M545 Low back pain: Secondary | ICD-10-CM | POA: Diagnosis not present

## 2020-05-01 DIAGNOSIS — I482 Chronic atrial fibrillation, unspecified: Secondary | ICD-10-CM | POA: Diagnosis not present

## 2020-05-01 DIAGNOSIS — R279 Unspecified lack of coordination: Secondary | ICD-10-CM | POA: Diagnosis not present

## 2020-05-01 DIAGNOSIS — J309 Allergic rhinitis, unspecified: Secondary | ICD-10-CM | POA: Diagnosis not present

## 2020-05-01 DIAGNOSIS — G465 Pure motor lacunar syndrome: Secondary | ICD-10-CM | POA: Diagnosis not present

## 2020-05-01 DIAGNOSIS — D509 Iron deficiency anemia, unspecified: Secondary | ICD-10-CM | POA: Diagnosis not present

## 2020-05-01 DIAGNOSIS — J45909 Unspecified asthma, uncomplicated: Secondary | ICD-10-CM | POA: Diagnosis not present

## 2020-05-01 DIAGNOSIS — A509 Congenital syphilis, unspecified: Secondary | ICD-10-CM | POA: Diagnosis not present

## 2020-05-01 DIAGNOSIS — G4733 Obstructive sleep apnea (adult) (pediatric): Secondary | ICD-10-CM | POA: Diagnosis not present

## 2020-05-01 DIAGNOSIS — R971 Elevated cancer antigen 125 [CA 125]: Secondary | ICD-10-CM | POA: Diagnosis not present

## 2020-05-01 DIAGNOSIS — K219 Gastro-esophageal reflux disease without esophagitis: Secondary | ICD-10-CM | POA: Diagnosis not present

## 2020-05-01 DIAGNOSIS — C880 Waldenstrom macroglobulinemia: Secondary | ICD-10-CM | POA: Diagnosis not present

## 2020-05-01 DIAGNOSIS — M6281 Muscle weakness (generalized): Secondary | ICD-10-CM | POA: Diagnosis not present

## 2020-05-01 DIAGNOSIS — M67921 Unspecified disorder of synovium and tendon, right upper arm: Secondary | ICD-10-CM | POA: Diagnosis not present

## 2020-05-01 DIAGNOSIS — G894 Chronic pain syndrome: Secondary | ICD-10-CM | POA: Diagnosis not present

## 2020-05-01 DIAGNOSIS — E785 Hyperlipidemia, unspecified: Secondary | ICD-10-CM | POA: Diagnosis not present

## 2020-05-02 DIAGNOSIS — A509 Congenital syphilis, unspecified: Secondary | ICD-10-CM | POA: Diagnosis not present

## 2020-05-02 DIAGNOSIS — M6281 Muscle weakness (generalized): Secondary | ICD-10-CM | POA: Diagnosis not present

## 2020-05-02 DIAGNOSIS — R279 Unspecified lack of coordination: Secondary | ICD-10-CM | POA: Diagnosis not present

## 2020-05-02 DIAGNOSIS — C880 Waldenstrom macroglobulinemia: Secondary | ICD-10-CM | POA: Diagnosis not present

## 2020-05-02 DIAGNOSIS — M545 Low back pain: Secondary | ICD-10-CM | POA: Diagnosis not present

## 2020-05-03 DIAGNOSIS — G4733 Obstructive sleep apnea (adult) (pediatric): Secondary | ICD-10-CM | POA: Diagnosis not present

## 2020-05-03 DIAGNOSIS — G465 Pure motor lacunar syndrome: Secondary | ICD-10-CM | POA: Diagnosis not present

## 2020-05-03 DIAGNOSIS — K219 Gastro-esophageal reflux disease without esophagitis: Secondary | ICD-10-CM | POA: Diagnosis not present

## 2020-05-03 DIAGNOSIS — R971 Elevated cancer antigen 125 [CA 125]: Secondary | ICD-10-CM | POA: Diagnosis not present

## 2020-05-03 DIAGNOSIS — I482 Chronic atrial fibrillation, unspecified: Secondary | ICD-10-CM | POA: Diagnosis not present

## 2020-05-03 DIAGNOSIS — G894 Chronic pain syndrome: Secondary | ICD-10-CM | POA: Diagnosis not present

## 2020-05-03 DIAGNOSIS — M6281 Muscle weakness (generalized): Secondary | ICD-10-CM | POA: Diagnosis not present

## 2020-05-03 DIAGNOSIS — J45909 Unspecified asthma, uncomplicated: Secondary | ICD-10-CM | POA: Diagnosis not present

## 2020-05-03 DIAGNOSIS — M67921 Unspecified disorder of synovium and tendon, right upper arm: Secondary | ICD-10-CM | POA: Diagnosis not present

## 2020-05-03 DIAGNOSIS — D509 Iron deficiency anemia, unspecified: Secondary | ICD-10-CM | POA: Diagnosis not present

## 2020-05-03 DIAGNOSIS — E785 Hyperlipidemia, unspecified: Secondary | ICD-10-CM | POA: Diagnosis not present

## 2020-05-03 DIAGNOSIS — J309 Allergic rhinitis, unspecified: Secondary | ICD-10-CM | POA: Diagnosis not present

## 2020-05-05 ENCOUNTER — Other Ambulatory Visit: Payer: Self-pay | Admitting: Oncology

## 2020-05-07 DIAGNOSIS — M25521 Pain in right elbow: Secondary | ICD-10-CM | POA: Diagnosis not present

## 2020-05-07 DIAGNOSIS — M13841 Other specified arthritis, right hand: Secondary | ICD-10-CM | POA: Diagnosis not present

## 2020-05-08 DIAGNOSIS — G894 Chronic pain syndrome: Secondary | ICD-10-CM | POA: Diagnosis not present

## 2020-05-08 DIAGNOSIS — M6281 Muscle weakness (generalized): Secondary | ICD-10-CM | POA: Diagnosis not present

## 2020-05-08 DIAGNOSIS — J309 Allergic rhinitis, unspecified: Secondary | ICD-10-CM | POA: Diagnosis not present

## 2020-05-08 DIAGNOSIS — K219 Gastro-esophageal reflux disease without esophagitis: Secondary | ICD-10-CM | POA: Diagnosis not present

## 2020-05-08 DIAGNOSIS — I482 Chronic atrial fibrillation, unspecified: Secondary | ICD-10-CM | POA: Diagnosis not present

## 2020-05-08 DIAGNOSIS — J45909 Unspecified asthma, uncomplicated: Secondary | ICD-10-CM | POA: Diagnosis not present

## 2020-05-08 DIAGNOSIS — E785 Hyperlipidemia, unspecified: Secondary | ICD-10-CM | POA: Diagnosis not present

## 2020-05-08 DIAGNOSIS — C8590 Non-Hodgkin lymphoma, unspecified, unspecified site: Secondary | ICD-10-CM | POA: Diagnosis not present

## 2020-05-08 DIAGNOSIS — G4733 Obstructive sleep apnea (adult) (pediatric): Secondary | ICD-10-CM | POA: Diagnosis not present

## 2020-05-08 DIAGNOSIS — D509 Iron deficiency anemia, unspecified: Secondary | ICD-10-CM | POA: Diagnosis not present

## 2020-05-09 ENCOUNTER — Other Ambulatory Visit: Payer: Self-pay | Admitting: *Deleted

## 2020-05-09 ENCOUNTER — Other Ambulatory Visit: Payer: Self-pay

## 2020-05-09 ENCOUNTER — Other Ambulatory Visit: Payer: Self-pay | Admitting: Oncology

## 2020-05-09 ENCOUNTER — Inpatient Hospital Stay: Payer: Medicare Other | Attending: Oncology

## 2020-05-09 ENCOUNTER — Inpatient Hospital Stay (HOSPITAL_BASED_OUTPATIENT_CLINIC_OR_DEPARTMENT_OTHER): Payer: Medicare Other | Admitting: Oncology

## 2020-05-09 ENCOUNTER — Inpatient Hospital Stay: Payer: Medicare Other

## 2020-05-09 VITALS — BP 124/66 | HR 78 | Temp 98.0°F | Resp 18 | Ht 66.0 in | Wt 254.3 lb

## 2020-05-09 VITALS — BP 132/52 | HR 76 | Temp 97.8°F | Resp 18

## 2020-05-09 DIAGNOSIS — Z5112 Encounter for antineoplastic immunotherapy: Secondary | ICD-10-CM | POA: Insufficient documentation

## 2020-05-09 DIAGNOSIS — C8800 Waldenstrom macroglobulinemia not having achieved remission: Secondary | ICD-10-CM

## 2020-05-09 DIAGNOSIS — Z8673 Personal history of transient ischemic attack (TIA), and cerebral infarction without residual deficits: Secondary | ICD-10-CM | POA: Diagnosis not present

## 2020-05-09 DIAGNOSIS — D709 Neutropenia, unspecified: Secondary | ICD-10-CM | POA: Insufficient documentation

## 2020-05-09 DIAGNOSIS — G894 Chronic pain syndrome: Secondary | ICD-10-CM

## 2020-05-09 DIAGNOSIS — Z5111 Encounter for antineoplastic chemotherapy: Secondary | ICD-10-CM | POA: Diagnosis not present

## 2020-05-09 DIAGNOSIS — G4730 Sleep apnea, unspecified: Secondary | ICD-10-CM | POA: Diagnosis not present

## 2020-05-09 LAB — CBC WITH DIFFERENTIAL (CANCER CENTER ONLY)
Abs Immature Granulocytes: 0.06 10*3/uL (ref 0.00–0.07)
Basophils Absolute: 0 10*3/uL (ref 0.0–0.1)
Basophils Relative: 0 %
Eosinophils Absolute: 0.2 10*3/uL (ref 0.0–0.5)
Eosinophils Relative: 5 %
HCT: 23.9 % — ABNORMAL LOW (ref 36.0–46.0)
Hemoglobin: 8.6 g/dL — ABNORMAL LOW (ref 12.0–15.0)
Immature Granulocytes: 1 %
Lymphocytes Relative: 18 %
Lymphs Abs: 0.8 10*3/uL (ref 0.7–4.0)
MCH: 33.7 pg (ref 26.0–34.0)
MCHC: 36 g/dL (ref 30.0–36.0)
MCV: 93.7 fL (ref 80.0–100.0)
Monocytes Absolute: 0.3 10*3/uL (ref 0.1–1.0)
Monocytes Relative: 6 %
Neutro Abs: 3.2 10*3/uL (ref 1.7–7.7)
Neutrophils Relative %: 70 %
Platelet Count: 374 10*3/uL (ref 150–400)
RBC: 2.55 MIL/uL — ABNORMAL LOW (ref 3.87–5.11)
RDW: 22.5 % — ABNORMAL HIGH (ref 11.5–15.5)
WBC Count: 4.6 10*3/uL (ref 4.0–10.5)
nRBC: 0 % (ref 0.0–0.2)

## 2020-05-09 LAB — CMP (CANCER CENTER ONLY)
ALT: 11 U/L (ref 0–44)
AST: 20 U/L (ref 15–41)
Albumin: 2.7 g/dL — ABNORMAL LOW (ref 3.5–5.0)
Alkaline Phosphatase: 95 U/L (ref 38–126)
Anion gap: 12 (ref 5–15)
BUN: 24 mg/dL — ABNORMAL HIGH (ref 8–23)
CO2: 26 mmol/L (ref 22–32)
Calcium: 10.2 mg/dL (ref 8.9–10.3)
Chloride: 103 mmol/L (ref 98–111)
Creatinine: 1.5 mg/dL — ABNORMAL HIGH (ref 0.44–1.00)
GFR, Estimated: 31 mL/min — ABNORMAL LOW (ref >60)
Glucose, Bld: 122 mg/dL — ABNORMAL HIGH (ref 70–99)
Potassium: 3.7 mmol/L (ref 3.5–5.1)
Sodium: 141 mmol/L (ref 135–145)
Total Bilirubin: 0.7 mg/dL (ref 0.3–1.2)
Total Protein: 9.8 g/dL — ABNORMAL HIGH (ref 6.5–8.1)

## 2020-05-09 LAB — URIC ACID: Uric Acid, Serum: 10.5 mg/dL — ABNORMAL HIGH (ref 2.5–7.1)

## 2020-05-09 MED ORDER — PALONOSETRON HCL INJECTION 0.25 MG/5ML
INTRAVENOUS | Status: AC
  Filled 2020-05-09: qty 5

## 2020-05-09 MED ORDER — SODIUM CHLORIDE 0.9 % IV SOLN
Freq: Once | INTRAVENOUS | Status: AC
  Filled 2020-05-09: qty 250

## 2020-05-09 MED ORDER — ACETAMINOPHEN 325 MG PO TABS
325.0000 mg | ORAL_TABLET | Freq: Once | ORAL | Status: AC
  Administered 2020-05-09: 325 mg via ORAL

## 2020-05-09 MED ORDER — ACETAMINOPHEN 325 MG PO TABS: 650.0000 mg | ORAL_TABLET | Freq: Once | ORAL | Status: DC

## 2020-05-09 MED ORDER — DIPHENHYDRAMINE HCL 50 MG/ML IJ SOLN
INTRAMUSCULAR | Status: AC
  Filled 2020-05-09: qty 1

## 2020-05-09 MED ORDER — DIPHENHYDRAMINE HCL 25 MG PO CAPS
ORAL_CAPSULE | ORAL | Status: AC
  Filled 2020-05-09: qty 2

## 2020-05-09 MED ORDER — ALLOPURINOL 100 MG PO TABS
100.0000 mg | ORAL_TABLET | Freq: Every day | ORAL | 1 refills | Status: AC
Start: 2020-05-09 — End: ?

## 2020-05-09 MED ORDER — PALONOSETRON HCL INJECTION 0.25 MG/5ML
0.2500 mg | Freq: Once | INTRAVENOUS | Status: AC
  Administered 2020-05-09: 0.25 mg via INTRAVENOUS

## 2020-05-09 MED ORDER — OXYCODONE HCL 5 MG PO TABS
ORAL_TABLET | ORAL | Status: AC
Start: 2020-05-09 — End: ?
  Filled 2020-05-09: qty 1

## 2020-05-09 MED ORDER — SODIUM CHLORIDE 0.9 % IV SOLN
10.0000 mg | Freq: Once | INTRAVENOUS | Status: AC
  Administered 2020-05-09: 10 mg via INTRAVENOUS
  Filled 2020-05-09: qty 10

## 2020-05-09 MED ORDER — OXYCODONE-ACETAMINOPHEN 5-325 MG PO TABS: 0.5000 | ORAL_TABLET | Freq: Four times a day (QID) | ORAL | 0 refills | Status: DC | PRN

## 2020-05-09 MED ORDER — SODIUM CHLORIDE 0.9 % IV SOLN
375.0000 mg/m2 | Freq: Once | INTRAVENOUS | Status: AC
  Administered 2020-05-09: 900 mg via INTRAVENOUS
  Filled 2020-05-09: qty 50

## 2020-05-09 MED ORDER — DIPHENHYDRAMINE HCL 25 MG PO CAPS
50.0000 mg | ORAL_CAPSULE | Freq: Once | ORAL | Status: AC
  Administered 2020-05-09: 50 mg via ORAL

## 2020-05-09 MED ORDER — ACETAMINOPHEN 325 MG PO TABS
ORAL_TABLET | ORAL | Status: AC
  Filled 2020-05-09: qty 2

## 2020-05-09 MED ORDER — OXYCODONE HCL 5 MG PO TABS
2.5000 mg | ORAL_TABLET | Freq: Once | ORAL | Status: AC
  Administered 2020-05-09: 2.5 mg via ORAL

## 2020-05-09 MED ORDER — SODIUM CHLORIDE 0.9 % IV SOLN
400.0000 mg/m2 | Freq: Once | INTRAVENOUS | Status: AC
  Administered 2020-05-09: 920 mg via INTRAVENOUS
  Filled 2020-05-09: qty 46

## 2020-05-09 MED ORDER — SODIUM CHLORIDE 0.9 % IV SOLN
2.0000 mg/m2 | Freq: Once | INTRAVENOUS | Status: AC
  Administered 2020-05-09: 4.8 mg via INTRAVENOUS
  Filled 2020-05-09: qty 2.4

## 2020-05-09 NOTE — Patient Instructions (Addendum)
[facility] Discharge Instructions for Patients Receiving Chemotherapy  Today you received the following chemotherapy agents: pentostatin, cyclophosphamide, rituximab.  To help prevent nausea and vomiting after your treatment, we encourage you to take your nausea medication as prescribed by the physician.   If you develop nausea and vomiting that is not controlled by your nausea medication, call the clinic.   BELOW ARE SYMPTOMS THAT SHOULD BE REPORTED IMMEDIATELY:  *FEVER GREATER THAN 100.5 F  *CHILLS WITH OR WITHOUT FEVER  NAUSEA AND VOMITING THAT IS NOT CONTROLLED WITH YOUR NAUSEA MEDICATION  *UNUSUAL SHORTNESS OF BREATH  *UNUSUAL BRUISING OR BLEEDING  TENDERNESS IN MOUTH AND THROAT WITH OR WITHOUT PRESENCE OF ULCERS  *URINARY PROBLEMS  *BOWEL PROBLEMS  UNUSUAL RASH Items with * indicate a potential emergency and should be followed up as soon as possible.  Feel free to call the clinic should you have any questions or concerns. The clinic phone number is [phone].  Please show the [place] at check-in to the Emergency Department and triage nurse.  Pentostatin injection What is this medicine? PENTOSTATIN (PEN toe stat in) is a chemotherapy drug. It interferes with the growth of cancer cells. It is usually used to treat hairy-cell leukemia. This medicine may be used for other purposes; ask your health care provider or pharmacist if you have questions. COMMON BRAND NAME(S): Nipent What should I tell my health care provider before I take this medicine? They need to know if you have any of these conditions:  infection (especially virus infection such as chickenpox or herpes)  kidney disease  liver disease  low blood counts like low platelets, red blood cells, white blood cells  an unusual or allergic reaction to pentostatin, other chemotherapy, other medicines, foods, dyes, or preservatives  pregnant or trying to get  pregnant  breast-feeding How should I use this medicine? This drug is given as an injection or infusion into a vein. It is administered in a hospital or clinic by a specially trained health care professional. Talk to your pediatrician regarding the use of this medicine in children. Special care may be needed. Overdosage: If you think you have taken too much of this medicine contact a poison control center or emergency room at once. NOTE: This medicine is only for you. Do not share this medicine with others. What if I miss a dose? It is important not to miss your dose. Call your doctor or health care professional if you are unable to keep an appointment. What may interact with this medicine?  carmustine  cyclophosphamide  etoposide  fludarabine  medicines to increase blood counts like filgrastim, pegfilgrastim, sargramostim  vaccines  vidarabine Talk to your doctor or health care professional before taking any of these medicines:  acetaminophen  aspirin  ibuprofen  ketoprofen  naproxen This list may not describe all possible interactions. Give your health care provider a list of all the medicines, herbs, non-prescription drugs, or dietary supplements you use. Also tell them if you smoke, drink alcohol, or use illegal drugs. Some items may interact with your medicine. What should I watch for while using this medicine? This drug may make you feel generally unwell. This is not uncommon, as chemotherapy can affect healthy cells as well as cancer cells. Report any side effects. Continue your course of treatment even though you feel ill unless your doctor tells you to stop. Call your doctor or health care professional for advice if you get a fever, chills or sore throat, or  other symptoms of a cold or flu. Do not treat yourself. This drug decreases your body's ability to fight infections. Try to avoid being around people who are sick. This medicine may increase your risk to bruise or  bleed. Call your doctor or health care professional if you notice any unusual bleeding. Be careful brushing and flossing your teeth or using a toothpick because you may get an infection or bleed more easily. If you have any dental work done, tell your dentist you are receiving this medicine. Avoid taking products that contain aspirin, acetaminophen, ibuprofen, naproxen, or ketoprofen unless instructed by your doctor. These medicines may hide a fever. This medicine can make you more sensitive to the sun. Keep out of the sun. If you cannot avoid being in the sun, wear protective clothing and use sunscreen. Do not use sun lamps or tanning beds/booths. Do not become pregnant while taking this medicine. Women should inform their doctor if they wish to become pregnant or think they might be pregnant. There is a potential for serious side effects to an unborn child. Talk to your health care professional or pharmacist for more information. Do not breast-feed an infant while taking this medicine. What side effects may I notice from receiving this medicine? Side effects that you should report to your doctor or health care professional as soon as possible:  allergic reactions like skin rash, itching or hives, swelling of the face, lips, or tongue  low blood counts - this medicine may decrease the number of white blood cells, red blood cells and platelets. You may be at increased risk for infections and bleeding.  signs of infection - fever or chills, cough, sore throat, pain or difficulty passing urine  signs of decreased platelets or bleeding - bruising, pinpoint red spots on the skin, black, tarry stools, nosebleeds  signs of decreased red blood cells - unusually weak or tired, fainting spells, lightheadedness  breathing problems, cough  changes in hearing  changes in emotions or moods  changes in vision  chest pain  confusion  dizziness  fast, irregular heartbeat  mouth sores  pain,  tingling, numbness in the hands or feet  seizures  swelling of the ankles, feet, hands  tremor  trouble passing urine or change in the amount of urine Side effects that usually do not require medical attention (report to your doctor or health care professional if they continue or are bothersome):  diarrhea  dry skin  headache  loss of appetite  muscle or joint pain  nausea, vomiting  stomach upset  trouble sleeping This list may not describe all possible side effects. Call your doctor for medical advice about side effects. You may report side effects to FDA at 1-800-FDA-1088. Where should I keep my medicine? This drug is given in a hospital or clinic and will not be stored at home. NOTE: This sheet is a summary. It may not cover all possible information. If you have questions about this medicine, talk to your doctor, pharmacist, or health care provider.  2020 Elsevier/Gold Standard (2008-02-16 14:12:33)  Cyclophosphamide Injection What is this medicine? CYCLOPHOSPHAMIDE (sye kloe FOSS fa mide) is a chemotherapy drug. It slows the growth of cancer cells. This medicine is used to treat many types of cancer like lymphoma, myeloma, leukemia, breast cancer, and ovarian cancer, to name a few. This medicine may be used for other purposes; ask your health care provider or pharmacist if you have questions. COMMON BRAND NAME(S): Cytoxan, Neosar What should I tell my  health care provider before I take this medicine? They need to know if you have any of these conditions:  heart disease  history of irregular heartbeat  infection  kidney disease  liver disease  low blood counts, like white cells, platelets, or red blood cells  on hemodialysis  recent or ongoing radiation therapy  scarring or thickening of the lungs  trouble passing urine  an unusual or allergic reaction to cyclophosphamide, other medicines, foods, dyes, or preservatives  pregnant or trying to get  pregnant  breast-feeding How should I use this medicine? This drug is usually given as an injection into a vein or muscle or by infusion into a vein. It is administered in a hospital or clinic by a specially trained health care professional. Talk to your pediatrician regarding the use of this medicine in children. Special care may be needed. Overdosage: If you think you have taken too much of this medicine contact a poison control center or emergency room at once. NOTE: This medicine is only for you. Do not share this medicine with others. What if I miss a dose? It is important not to miss your dose. Call your doctor or health care professional if you are unable to keep an appointment. What may interact with this medicine?  amphotericin B  azathioprine  certain antivirals for HIV or hepatitis  certain medicines for blood pressure, heart disease, irregular heart beat  certain medicines that treat or prevent blood clots like warfarin  certain other medicines for cancer  cyclosporine  etanercept  indomethacin  medicines that relax muscles for surgery  medicines to increase blood counts  metronidazole This list may not describe all possible interactions. Give your health care provider a list of all the medicines, herbs, non-prescription drugs, or dietary supplements you use. Also tell them if you smoke, drink alcohol, or use illegal drugs. Some items may interact with your medicine. What should I watch for while using this medicine? Your condition will be monitored carefully while you are receiving this medicine. You may need blood work done while you are taking this medicine. Drink water or other fluids as directed. Urinate often, even at night. Some products may contain alcohol. Ask your health care professional if this medicine contains alcohol. Be sure to tell all health care professionals you are taking this medicine. Certain medicines, like metronidazole and disulfiram, can  cause an unpleasant reaction when taken with alcohol. The reaction includes flushing, headache, nausea, vomiting, sweating, and increased thirst. The reaction can last from 30 minutes to several hours. Do not become pregnant while taking this medicine or for 1 year after stopping it. Women should inform their health care professional if they wish to become pregnant or think they might be pregnant. Men should not father a child while taking this medicine and for 4 months after stopping it. There is potential for serious side effects to an unborn child. Talk to your health care professional for more information. Do not breast-feed an infant while taking this medicine or for 1 week after stopping it. This medicine has caused ovarian failure in some women. This medicine may make it more difficult to get pregnant. Talk to your health care professional if you are concerned about your fertility. This medicine has caused decreased sperm counts in some men. This may make it more difficult to father a child. Talk to your health care professional if you are concerned about your fertility. Call your health care professional for advice if you get a fever,  chills, or sore throat, or other symptoms of a cold or flu. Do not treat yourself. This medicine decreases your body's ability to fight infections. Try to avoid being around people who are sick. Avoid taking medicines that contain aspirin, acetaminophen, ibuprofen, naproxen, or ketoprofen unless instructed by your health care professional. These medicines may hide a fever. Talk to your health care professional about your risk of cancer. You may be more at risk for certain types of cancer if you take this medicine. If you are going to need surgery or other procedure, tell your health care professional that you are using this medicine. Be careful brushing or flossing your teeth or using a toothpick because you may get an infection or bleed more easily. If you have any  dental work done, tell your dentist you are receiving this medicine. What side effects may I notice from receiving this medicine? Side effects that you should report to your doctor or health care professional as soon as possible:  allergic reactions like skin rash, itching or hives, swelling of the face, lips, or tongue  breathing problems  nausea, vomiting  signs and symptoms of bleeding such as bloody or black, tarry stools; red or dark brown urine; spitting up blood or brown material that looks like coffee grounds; red spots on the skin; unusual bruising or bleeding from the eyes, gums, or nose  signs and symptoms of heart failure like fast, irregular heartbeat, sudden weight gain; swelling of the ankles, feet, hands  signs and symptoms of infection like fever; chills; cough; sore throat; pain or trouble passing urine  signs and symptoms of kidney injury like trouble passing urine or change in the amount of urine  signs and symptoms of liver injury like dark yellow or brown urine; general ill feeling or flu-like symptoms; light-colored stools; loss of appetite; nausea; right upper belly pain; unusually weak or tired; yellowing of the eyes or skin Side effects that usually do not require medical attention (report to your doctor or health care professional if they continue or are bothersome):  confusion  decreased hearing  diarrhea  facial flushing  hair loss  headache  loss of appetite  missed menstrual periods  signs and symptoms of low red blood cells or anemia such as unusually weak or tired; feeling faint or lightheaded; falls  skin discoloration This list may not describe all possible side effects. Call your doctor for medical advice about side effects. You may report side effects to FDA at 1-800-FDA-1088. Where should I keep my medicine? This drug is given in a hospital or clinic and will not be stored at home. NOTE: This sheet is a summary. It may not cover all  possible information. If you have questions about this medicine, talk to your doctor, pharmacist, or health care provider.  2020 Elsevier/Gold Standard (2019-04-24 09:53:29)  Rituximab injection What is this medicine? RITUXIMAB (ri TUX i mab) is a monoclonal antibody. It is used to treat certain types of cancer like non-Hodgkin lymphoma and chronic lymphocytic leukemia. It is also used to treat rheumatoid arthritis, granulomatosis with polyangiitis (or Wegener's granulomatosis), microscopic polyangiitis, and pemphigus vulgaris. This medicine may be used for other purposes; ask your health care provider or pharmacist if you have questions. COMMON BRAND NAME(S): Rituxan, RUXIENCE What should I tell my health care provider before I take this medicine? They need to know if you have any of these conditions:  heart disease  infection (especially a virus infection such as hepatitis B, chickenpox, cold  sores, or herpes)  immune system problems  irregular heartbeat  kidney disease  low blood counts, like low white cell, platelet, or red cell counts  lung or breathing disease, like asthma  recently received or scheduled to receive a vaccine  an unusual or allergic reaction to rituximab, other medicines, foods, dyes, or preservatives  pregnant or trying to get pregnant  breast-feeding How should I use this medicine? This medicine is for infusion into a vein. It is administered in a hospital or clinic by a specially trained health care professional. A special MedGuide will be given to you by the pharmacist with each prescription and refill. Be sure to read this information carefully each time. Talk to your pediatrician regarding the use of this medicine in children. This medicine is not approved for use in children. Overdosage: If you think you have taken too much of this medicine contact a poison control center or emergency room at once. NOTE: This medicine is only for you. Do not share  this medicine with others. What if I miss a dose? It is important not to miss a dose. Call your doctor or health care professional if you are unable to keep an appointment. What may interact with this medicine?  cisplatin  live virus vaccines This list may not describe all possible interactions. Give your health care provider a list of all the medicines, herbs, non-prescription drugs, or dietary supplements you use. Also tell them if you smoke, drink alcohol, or use illegal drugs. Some items may interact with your medicine. What should I watch for while using this medicine? Your condition will be monitored carefully while you are receiving this medicine. You may need blood work done while you are taking this medicine. This medicine can cause serious allergic reactions. To reduce your risk you may need to take medicine before treatment with this medicine. Take your medicine as directed. In some patients, this medicine may cause a serious brain infection that may cause death. If you have any problems seeing, thinking, speaking, walking, or standing, tell your healthcare professional right away. If you cannot reach your healthcare professional, urgently seek other source of medical care. Call your doctor or health care professional for advice if you get a fever, chills or sore throat, or other symptoms of a cold or flu. Do not treat yourself. This drug decreases your body's ability to fight infections. Try to avoid being around people who are sick. Do not become pregnant while taking this medicine or for at least 12 months after stopping it. Women should inform their doctor if they wish to become pregnant or think they might be pregnant. There is a potential for serious side effects to an unborn child. Talk to your health care professional or pharmacist for more information. Do not breast-feed an infant while taking this medicine or for at least 6 months after stopping it. What side effects may I notice  from receiving this medicine? Side effects that you should report to your doctor or health care professional as soon as possible:  allergic reactions like skin rash, itching or hives; swelling of the face, lips, or tongue  breathing problems  chest pain  changes in vision  diarrhea  headache with fever, neck stiffness, sensitivity to light, nausea, or confusion  fast, irregular heartbeat  loss of memory  low blood counts - this medicine may decrease the number of white blood cells, red blood cells and platelets. You may be at increased risk for infections and bleeding.  mouth sores  problems with balance, talking, or walking  redness, blistering, peeling or loosening of the skin, including inside the mouth  signs of infection - fever or chills, cough, sore throat, pain or difficulty passing urine  signs and symptoms of kidney injury like trouble passing urine or change in the amount of urine  signs and symptoms of liver injury like dark yellow or brown urine; general ill feeling or flu-like symptoms; light-colored stools; loss of appetite; nausea; right upper belly pain; unusually weak or tired; yellowing of the eyes or skin  signs and symptoms of low blood pressure like dizziness; feeling faint or lightheaded, falls; unusually weak or tired  stomach pain  swelling of the ankles, feet, hands  unusual bleeding or bruising  vomiting Side effects that usually do not require medical attention (report to your doctor or health care professional if they continue or are bothersome):  headache  joint pain  muscle cramps or muscle pain  nausea  tiredness This list may not describe all possible side effects. Call your doctor for medical advice about side effects. You may report side effects to FDA at 1-800-FDA-1088. Where should I keep my medicine? This drug is given in a hospital or clinic and will not be stored at home. NOTE: This sheet is a summary. It may not cover  all possible information. If you have questions about this medicine, talk to your doctor, pharmacist, or health care provider.  2020 Elsevier/Gold Standard (2018-08-31 22:01:36)

## 2020-05-09 NOTE — Progress Notes (Signed)
Patient's daughter stated she received a phone call from the facility the patient currently resides, stating that a worker had tested positive for Covid. Daughter states she was not given any information on if/when the patient had come in contact with the worker. Patient is not symptomatic. Lurlean Horns, Director informed, and stated the ok to treat needed to come from the patient's physician. Dr. Benay Spice notified. Per Dr. Benay Spice, ok to treat patient today.

## 2020-05-09 NOTE — Progress Notes (Signed)
allopurinol new order

## 2020-05-09 NOTE — Progress Notes (Signed)
[place] OFFICE PROGRESS NOTE   Diagnosis: WaldenStrom's macroglobulinemia  INTERVAL HISTORY:   Natasha Ellis returns for a scheduled visit.  She reports pain at the right "elbow "for the past 3 weeks.  She also has increased pain at the left "hip ".  She is not taking pain medication.  She reports hydrocodone has caused confusion in the past. She saw Dr. Caralyn Guile on 05/07/2020.  A plain x-ray revealed lucencies within the humeral shaft with loss of the cortex at the mid humerus and cortical irregularity in the proximal radius.  Objective:  Vital signs in last 24 hours:  Blood pressure 124/66, pulse 78, temperature 98 F (36.7 C), temperature source Tympanic, resp. rate 18, height 5\' 6"  (1.676 m), weight 254 lb 4.8 oz (115.3 kg), SpO2 99 %.    Lymphatics: No cervical, supraclavicular, axillary, or inguinal nodes Resp: End inspiratory/expiratory rhonchi at the posterior base bilaterally, no respiratory distress Cardio: Regular rate and rhythm GI: No hepatosplenomegaly Vascular: No leg edema Musculoskeletal: Tenderness at the right arm at the upper radial area and lower humerus, no mass, tender at the left lateral posterior iliac   Lab Results:  Lab Results  Component Value Date   WBC 4.6 05/09/2020   HGB 8.6 (L) 05/09/2020   HCT 23.9 (L) 05/09/2020   MCV 93.7 05/09/2020   PLT 374 05/09/2020   NEUTROABS 3.2 05/09/2020    CMP  Lab Results  Component Value Date   NA 141 05/09/2020   K 3.7 05/09/2020   CL 103 05/09/2020   CO2 26 05/09/2020   GLUCOSE 122 (H) 05/09/2020   BUN 24 (H) 05/09/2020   CREATININE 1.50 (H) 05/09/2020   CALCIUM 10.2 05/09/2020   PROT 9.8 (H) 05/09/2020   ALBUMIN 2.7 (L) 05/09/2020   AST 20 05/09/2020   ALT 11 05/09/2020   ALKPHOS 95 05/09/2020   BILITOT 0.7 05/09/2020   GFRNONAA 31 (L) 05/09/2020   GFRAA 44 (L) 04/05/2020    Medications: I have reviewed the patient's current medications.   Assessment/Plan:  1. 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 Treated with multiple systemic therapies including pentostatin/Cytoxan/rituximab in 2010.   Cycle 1 bendamustine/Rituxan 05/31/2014.  Cycle 2 bendamustine/Rituxan 07/02/2014  Cycle 3 bendamustine/rituximab 07/30/2014  Cycle 4 bendamustine/rituximab 09/10/2014  Cycle 5 bendamustine/rituximab 10/22/2014  Cycle 6 bendamustine/rituximab 12/10/2014  PET scan 03/27/2019-diffuse small hypermetabolic lymph nodes in the neck, chest, abdomen, and pelvis, heterogenous hypermetabolism in the bones  CT chest 10/02/2019-bronchiectasis at the lung bases, 13 mm nodular area at the medial left hemidiaphragm, mildly enlarged mediastinal nodes-decreased from June 2020, slightly enlarged left axillary node-9 mm  Rising [name] level March and April 2021  Cycle 1 bendamustine/rituximab 11/27/2019  Cycle 2 bendamustine/Rituxan 12/26/2019  01/22/2020 [name] 4225 (stable)   Cycle 1 cyclophosphamide, pentostatin, rituximab 05/09/2020 2. Left knee arthritis. Followed by Dr. Wynelle Link. 3. Sleep apnea. 4. Anemia. microcytic with peripheral blood smear findings consistent with iron deficiency 07/09/2015, serum iron studies consistent with "anemia of chronic disease ", stool Hemoccult cards negative 07/11/2015 5. Question left parotitis 12/12/2013 presenting with left facial and parotid edema. 6. Admission with pneumonia February 2016 7. History of Neutropenia secondary to chemotherapy, now with moderate neutropenia 8. Left lung pneumonia 04/02/2015, sputum culture positive for Moraxella 06/19/2015 9. Low IgG and IgA 10. CT chest 05/13/2015 with bilateral lower  lung consolidation 11. CT chest, abdomen, and pelvis 01/08/2016-new peribronchial thickening and peripheral tree in bud pattern at the right lower lobe,  no lymphadenopathy 12. Sinus mucosal thickening/opacification on CT 08/16/2015-status post sinus surgery 12/05/2015 13. Sputum culture positive for pseudomonas aeruginosa on 01/10/2016-treated with 10 days of ciprofloxacin 14. 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 Admission with pneumonia 08/26/2016 16. Left lung nodule increased in size on a CT 01/06/2019, not hypermetabolic on the PET scan 01/28/3150, followed by Dr. Elsworth Soho 17.  Admission with rectal bleeding 01/02/2020-diverticular? 18.  Pain at the right arm and left iliac-likely related to lymphoma, plain x-ray 05/07/2020 at [facility] revealed destructive changes in the right humerus and radius      Disposition: Natasha Ellis has a history of low-grade non-Hodgkin's lymphoma.  There is clinical and laboratory evidence of disease progression.  She is symptomatic with malaise.  I suspect the pain in x-ray changes at the right arm are related to lymphoma.  She had evidence of lymphoma involving the left iliac on a PET scan in August 2020.  We will check a plain x-ray of the left hip and pelvis.  Natasha Ellis will begin systemic treatment with cyclophosphamide, pentostatin, and rituximab today.  Hopefully this will result in improvement of her symptoms.  She will try oxycodone for pain.  She will return for an office and lab visit in 1 week.  Chemotherapy will be dose reduced based on her renal function, age, and comorbid conditions.  [name], MD  05/09/2020  1:53 PM

## 2020-05-10 ENCOUNTER — Telehealth: Payer: Self-pay | Admitting: Oncology

## 2020-05-10 LAB — IGM: IgM (Immunoglobulin M), Srm: 6445 mg/dL — ABNORMAL HIGH (ref 26–217)

## 2020-05-10 NOTE — Telephone Encounter (Signed)
Scheduled appointments per 10/7 los. Called patient using both numbers, no answer. Left message for patient on voicemail with appointments date and times.

## 2020-05-12 LAB — PROTEIN ELECTROPHORESIS, SERUM
A/G Ratio: 0.5 — ABNORMAL LOW (ref 0.7–1.7)
Albumin ELP: 3.1 g/dL (ref 2.9–4.4)
Alpha-1-Globulin: 0.3 g/dL (ref 0.0–0.4)
Alpha-2-Globulin: 0.9 g/dL (ref 0.4–1.0)
Beta Globulin: 0.8 g/dL (ref 0.7–1.3)
Gamma Globulin: 4.2 g/dL — ABNORMAL HIGH (ref 0.4–1.8)
Globulin, Total: 6.2 g/dL — ABNORMAL HIGH (ref 2.2–3.9)
M-Spike, %: 4.1 g/dL — ABNORMAL HIGH
Total Protein ELP: 9.3 g/dL — ABNORMAL HIGH (ref 6.0–8.5)

## 2020-05-13 DIAGNOSIS — M6281 Muscle weakness (generalized): Secondary | ICD-10-CM | POA: Diagnosis not present

## 2020-05-13 DIAGNOSIS — M67921 Unspecified disorder of synovium and tendon, right upper arm: Secondary | ICD-10-CM | POA: Diagnosis not present

## 2020-05-13 DIAGNOSIS — G894 Chronic pain syndrome: Secondary | ICD-10-CM | POA: Diagnosis not present

## 2020-05-13 DIAGNOSIS — I482 Chronic atrial fibrillation, unspecified: Secondary | ICD-10-CM | POA: Diagnosis not present

## 2020-05-13 DIAGNOSIS — K219 Gastro-esophageal reflux disease without esophagitis: Secondary | ICD-10-CM | POA: Diagnosis not present

## 2020-05-13 DIAGNOSIS — E785 Hyperlipidemia, unspecified: Secondary | ICD-10-CM | POA: Diagnosis not present

## 2020-05-13 DIAGNOSIS — J45909 Unspecified asthma, uncomplicated: Secondary | ICD-10-CM | POA: Diagnosis not present

## 2020-05-13 DIAGNOSIS — J309 Allergic rhinitis, unspecified: Secondary | ICD-10-CM | POA: Diagnosis not present

## 2020-05-13 DIAGNOSIS — D509 Iron deficiency anemia, unspecified: Secondary | ICD-10-CM | POA: Diagnosis not present

## 2020-05-13 DIAGNOSIS — G4733 Obstructive sleep apnea (adult) (pediatric): Secondary | ICD-10-CM | POA: Diagnosis not present

## 2020-05-13 DIAGNOSIS — C8590 Non-Hodgkin lymphoma, unspecified, unspecified site: Secondary | ICD-10-CM | POA: Diagnosis not present

## 2020-05-14 ENCOUNTER — Telehealth: Payer: Self-pay | Admitting: *Deleted

## 2020-05-15 ENCOUNTER — Other Ambulatory Visit: Payer: Self-pay

## 2020-05-15 ENCOUNTER — Ambulatory Visit (HOSPITAL_COMMUNITY)
Admission: RE | Admit: 2020-05-15 | Discharge: 2020-05-15 | Disposition: A | Discharge status: Home / Self Care | Payer: Medicare Other | Source: Ambulatory Visit | Attending: Oncology | Admitting: Oncology

## 2020-05-15 ENCOUNTER — Inpatient Hospital Stay (HOSPITAL_BASED_OUTPATIENT_CLINIC_OR_DEPARTMENT_OTHER): Payer: Medicare Other | Admitting: Oncology

## 2020-05-15 ENCOUNTER — Inpatient Hospital Stay: Payer: Medicare Other

## 2020-05-15 ENCOUNTER — Telehealth: Payer: Self-pay | Admitting: *Deleted

## 2020-05-15 VITALS — BP 107/51 | HR 76 | Temp 97.8°F | Resp 17 | Ht 66.0 in

## 2020-05-15 DIAGNOSIS — Z8673 Personal history of transient ischemic attack (TIA), and cerebral infarction without residual deficits: Secondary | ICD-10-CM | POA: Diagnosis not present

## 2020-05-15 DIAGNOSIS — C8800 Waldenstrom macroglobulinemia not having achieved remission: Secondary | ICD-10-CM

## 2020-05-15 DIAGNOSIS — G4730 Sleep apnea, unspecified: Secondary | ICD-10-CM | POA: Diagnosis not present

## 2020-05-15 DIAGNOSIS — Z5112 Encounter for antineoplastic immunotherapy: Secondary | ICD-10-CM | POA: Diagnosis not present

## 2020-05-15 DIAGNOSIS — D709 Neutropenia, unspecified: Secondary | ICD-10-CM | POA: Diagnosis not present

## 2020-05-15 DIAGNOSIS — C8590 Non-Hodgkin lymphoma, unspecified, unspecified site: Secondary | ICD-10-CM | POA: Diagnosis not present

## 2020-05-15 DIAGNOSIS — Z5111 Encounter for antineoplastic chemotherapy: Secondary | ICD-10-CM | POA: Diagnosis not present

## 2020-05-15 LAB — URIC ACID: Uric Acid, Serum: 9.9 mg/dL — ABNORMAL HIGH (ref 2.5–7.1)

## 2020-05-15 LAB — CBC WITH DIFFERENTIAL (CANCER CENTER ONLY)
Abs Immature Granulocytes: 0 10*3/uL (ref 0.00–0.07)
Basophils Absolute: 0 10*3/uL (ref 0.0–0.1)
Basophils Relative: 2 %
Eosinophils Absolute: 0.1 10*3/uL (ref 0.0–0.5)
Eosinophils Relative: 5 %
HCT: 22.9 % — ABNORMAL LOW (ref 36.0–46.0)
Hemoglobin: 8.5 g/dL — ABNORMAL LOW (ref 12.0–15.0)
Immature Granulocytes: 0 %
Lymphocytes Relative: 33 %
Lymphs Abs: 0.5 10*3/uL — ABNORMAL LOW (ref 0.7–4.0)
MCH: 34.6 pg — ABNORMAL HIGH (ref 26.0–34.0)
MCHC: 37.1 g/dL — ABNORMAL HIGH (ref 30.0–36.0)
MCV: 93.1 fL (ref 80.0–100.0)
Monocytes Absolute: 0.2 10*3/uL (ref 0.1–1.0)
Monocytes Relative: 16 %
Neutro Abs: 0.6 10*3/uL — ABNORMAL LOW (ref 1.7–7.7)
Neutrophils Relative %: 44 %
Platelet Count: 347 10*3/uL (ref 150–400)
RBC: 2.46 MIL/uL — ABNORMAL LOW (ref 3.87–5.11)
RDW: 21.7 % — ABNORMAL HIGH (ref 11.5–15.5)
WBC Count: 1.4 10*3/uL — ABNORMAL LOW (ref 4.0–10.5)
nRBC: 0 % (ref 0.0–0.2)

## 2020-05-15 LAB — BASIC METABOLIC PANEL - CANCER CENTER ONLY
Anion gap: 7 (ref 5–15)
BUN: 29 mg/dL — ABNORMAL HIGH (ref 8–23)
CO2: 29 mmol/L (ref 22–32)
Calcium: 9.9 mg/dL (ref 8.9–10.3)
Chloride: 100 mmol/L (ref 98–111)
Creatinine: 1.52 mg/dL — ABNORMAL HIGH (ref 0.44–1.00)
GFR, Estimated: 31 mL/min — ABNORMAL LOW (ref >60)
Glucose, Bld: 102 mg/dL — ABNORMAL HIGH (ref 70–99)
Potassium: 4.2 mmol/L (ref 3.5–5.1)
Sodium: 136 mmol/L (ref 135–145)

## 2020-05-15 NOTE — Progress Notes (Signed)
[place] OFFICE PROGRESS NOTE   Diagnosis: Natasha Ellis macroglobulinemia  INTERVAL HISTORY:   Natasha Ellis completed cycle 1 cyclophosphamide, pentostatin, and rituximab on 05/09/2020.  No nausea, mouth sores, or symptoms of an allergic reaction.  She continues to have malaise and pain.  She has pain with palpation at the right arm.  She also has pain at the "hip ".  The pain is not worse with movement or weightbearing.  Oxycodone (7.5 mg) helps the pain.  She has not become confused while on oxycodone.  Objective:  Vital signs in last 24 hours:  Blood pressure (!) 107/51, pulse 76, temperature 97.8 F (36.6 C), temperature source Oral, resp. rate 17, height 5\' 6"  (1.676 m).    HEENT: No thrush or ulcers Resp: Bilateral inspiratory rhonchi at the posterior chest, mild wheezing, no respiratory distress Cardio: Regular rate and rhythm GI: No hepatosplenomegaly, nontender Vascular: No leg edema Musculoskeletal: No pain with palpation at the spine or posterior iliac.  No pain with motion of the left hip.  The area of "hip "pain is at the left lateral iliac.   Lab Results:  Lab Results  Component Value Date   WBC 1.4 (L) 05/15/2020   HGB 8.5 (L) 05/15/2020   HCT 22.9 (L) 05/15/2020   MCV 93.1 05/15/2020   PLT 347 05/15/2020   NEUTROABS 0.6 (L) 05/15/2020    CMP  Lab Results  Component Value Date   NA 136 05/15/2020   K 4.2 05/15/2020   CL 100 05/15/2020   CO2 29 05/15/2020   GLUCOSE 102 (H) 05/15/2020   BUN 29 (H) 05/15/2020   CREATININE 1.52 (H) 05/15/2020   CALCIUM 9.9 05/15/2020   PROT 9.8 (H) 05/09/2020   ALBUMIN 2.7 (L) 05/09/2020   AST 20 05/09/2020   ALT 11 05/09/2020   ALKPHOS 95 05/09/2020   BILITOT 0.7 05/09/2020   GFRNONAA 31 (L) 05/15/2020   GFRAA 44 (L) 04/05/2020     Medications: I have reviewed the patient's current medications.   Assessment/Plan: 1. 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 Treated with multiple systemic therapies including pentostatin/Cytoxan/rituximab in 2010.   Cycle 1 bendamustine/Rituxan 05/31/2014.  Cycle 2 bendamustine/Rituxan 07/02/2014  Cycle 3 bendamustine/rituximab 07/30/2014  Cycle 4 bendamustine/rituximab 09/10/2014  Cycle 5 bendamustine/rituximab 10/22/2014  Cycle 6 bendamustine/rituximab 12/10/2014  PET scan 03/27/2019-diffuse small hypermetabolic lymph nodes in the neck, chest, abdomen, and pelvis, heterogenous hypermetabolism in the bones  CT chest 10/02/2019-bronchiectasis at the lung bases, 13 mm nodular area at the medial left hemidiaphragm, mildly enlarged mediastinal nodes-decreased from June 2020, slightly enlarged left axillary node-9 mm  Rising [name] level March and April 2021  Cycle 1 bendamustine/rituximab 11/27/2019  Cycle 2 bendamustine/Rituxan 12/26/2019  01/22/2020 [name] 4225 (stable)   Cycle 1 cyclophosphamide, pentostatin, rituximab 05/09/2020 2. Left knee arthritis. Followed by Dr. Wynelle Link. 3. Sleep apnea. 4. Anemia. microcytic with peripheral blood smear findings consistent with iron deficiency 07/09/2015, serum iron studies consistent with "anemia of chronic disease ", stool Hemoccult cards negative 07/11/2015 5. Question left parotitis 12/12/2013 presenting with left facial and parotid edema. 6. Admission with pneumonia February 2016 7. History of Neutropenia secondary to chemotherapy, now with moderate neutropenia 8. Left lung pneumonia 04/02/2015, sputum culture positive for Moraxella 06/19/2015 9. Low IgG and IgA 10. CT chest 05/13/2015 with bilateral lower lung consolidation 11.  CT chest, abdomen, and pelvis 01/08/2016-new peribronchial thickening and peripheral tree in bud pattern at the right lower lobe, no lymphadenopathy 12. Sinus mucosal thickening/opacification  on CT 08/16/2015-status post sinus surgery 12/05/2015 13. Sputum culture positive for pseudomonas aeruginosa on 01/10/2016-treated with 10 days of ciprofloxacin 14. 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 Admission with pneumonia 08/26/2016 16. Left lung nodule increased in size on a CT 01/06/2019, not hypermetabolic on the PET scan 5/45/6256, followed by Dr. Elsworth Soho 17.  Admission with rectal bleeding 01/02/2020-diverticular? 18.  Pain at the right arm and left iliac-likely related to lymphoma, plain x-ray 05/07/2020 at [facility] revealed destructive changes in the right humerus and radius    Disposition: Natasha Ellis is now at day 7 following cycle 1 pentostatin, cyclophosphamide, and rituximab.  She has moderate neutropenia.  She knows to call for a fever or symptoms of an infection.  She will begin ciprofloxacin prophylaxis and return for a CBC on 05/20/2020.  She continues to have pain at the right arm and left pelvis, likely secondary to lymphoma involving the bones.  She will be referred for a plain x-ray of the left hip today.  She will continue oxycodone as needed for pain.  The uric acid was elevated prior to chemotherapy last week.  We will repeat a uric acid level today.  She continues allopurinol.  Natasha Ellis will return for an office visit and cycle 2 chemotherapy on 05/30/2020.  She plans to obtain an influenza vaccine at the [place] home.   [name], MD  05/15/2020  12:43 PM

## 2020-05-15 NOTE — Telephone Encounter (Signed)
MAR was received by facility after patient had left. Nurse called to get verbal order to D/C the Percocet 5/325 since MD wrote new order for percocet 7.5/325 #1 every 4 hours prn. Also needed clarification on why she is on Cipro and she was informed due to neutropenia. Gave verbal order to check her temp every shift while neutropenic.

## 2020-05-17 ENCOUNTER — Telehealth: Payer: Self-pay | Admitting: Oncology

## 2020-05-17 NOTE — Telephone Encounter (Signed)
Scheduled per 10/14 los. Spoke with pt's daughter and is aware of appt times and dates.

## 2020-05-20 ENCOUNTER — Other Ambulatory Visit: Payer: Medicare Other

## 2020-05-20 ENCOUNTER — Telehealth: Payer: Self-pay | Admitting: *Deleted

## 2020-05-20 DIAGNOSIS — G894 Chronic pain syndrome: Secondary | ICD-10-CM | POA: Diagnosis not present

## 2020-05-20 DIAGNOSIS — F411 Generalized anxiety disorder: Secondary | ICD-10-CM | POA: Diagnosis not present

## 2020-05-20 NOTE — Telephone Encounter (Signed)
Patient called back to report she forgot that she did have her blood drawn today, she had forgotten. Called nursing supervisor back to apologize for the mistake and request results be faxed asap. Also informed her that patient reports she is only getting 1/2 Percocet tablet per dose at night and that Dr. Benay Spice had ordered for her to have #1 tab every 4 hours prn.

## 2020-05-20 NOTE — Telephone Encounter (Signed)
Error

## 2020-05-20 NOTE — Telephone Encounter (Signed)
Called patient to inquire if her labs were drawn today? She said no, and that the night nurse only gave her 1/2 of Percocet during night instead of the full tablet ordered on 05/15/20. She is asking about the pain patch MD had discussed w/her? She is still in a lot of pain. Informed her that the xray showed no fracture, but likely a lymphoma lesion in left pelvis. If pain not better after next cycle of chemo, he will order a CT scan. Suggested she needs to come to office if the facility is not able to draw the labs requested and she begs not to come in due to pain. Faxed order for labs again w/note to draw today or early am and send stat. Also called and left VM at [phone] requesting labs today or early am and run stat and also left same request on nursing supervisor voice mail with request to return call to confirm receipt.

## 2020-05-20 NOTE — Addendum Note (Signed)
Addended by: Tania Ade on: 05/20/2020 03:26 PM   Modules accepted: Orders

## 2020-05-24 ENCOUNTER — Telehealth: Payer: Self-pay | Admitting: *Deleted

## 2020-05-24 NOTE — Telephone Encounter (Signed)
Nursing supervisor called to report decline in condition. Weakness is progressive to point physical therapy was discontinued because she could not participate. Getting more confused, especially at night and pain is still uncontrolled w/oxycodone/apap and fentanyl patch 62mcg patch added on 05/21/20. Asking what are Dr. Gearldine Shown plans/expectations for him? Informed note will be left for MD review on Monday and she will be called back.

## 2020-05-26 ENCOUNTER — Other Ambulatory Visit: Payer: Self-pay | Admitting: Oncology

## 2020-05-27 ENCOUNTER — Telehealth: Payer: Self-pay | Admitting: *Deleted

## 2020-05-27 NOTE — Telephone Encounter (Signed)
Daughter called to report patient still having significant pain-facility has increased her duragesic to 52mcg now and decreased the percocet to only if she asks for it. Seems to be having muscle spasms in her hip as well--can they resume flexeril? Asking about adding an antidepressant to help w/pain management. Informed her the duragesic increase is still a low dose. Suggested this all be reviewed at her appointment on 10/28/21with Dr. Benay Spice.

## 2020-05-29 ENCOUNTER — Telehealth: Payer: Self-pay | Admitting: *Deleted

## 2020-05-29 DIAGNOSIS — F4323 Adjustment disorder with mixed anxiety and depressed mood: Secondary | ICD-10-CM | POA: Diagnosis not present

## 2020-05-29 NOTE — Telephone Encounter (Signed)
Called to report she is having significant anxiety. Suggested by psych evaluation to start an antidepressant, but patient declines until speaking w/Dr. Benay Spice. They also will have chaplain start meeting with her for counseling. She has declined palliative team referral, saying she wants to fight and is afraid to die, but she won't participate in PT. Wanted MD aware. Informed nurse that this will be shared w/MD at her visit tomorrow and that her xray report will be sent back w/her as well.

## 2020-05-30 ENCOUNTER — Inpatient Hospital Stay: Payer: Medicare Other

## 2020-05-30 ENCOUNTER — Telehealth: Payer: Self-pay | Admitting: Oncology

## 2020-05-30 ENCOUNTER — Inpatient Hospital Stay (HOSPITAL_BASED_OUTPATIENT_CLINIC_OR_DEPARTMENT_OTHER): Payer: Medicare Other | Admitting: Oncology

## 2020-05-30 ENCOUNTER — Other Ambulatory Visit: Payer: Self-pay

## 2020-05-30 VITALS — BP 125/65 | HR 86 | Temp 97.2°F | Resp 16 | Ht 66.0 in | Wt 258.2 lb

## 2020-05-30 DIAGNOSIS — G4730 Sleep apnea, unspecified: Secondary | ICD-10-CM | POA: Diagnosis not present

## 2020-05-30 DIAGNOSIS — D709 Neutropenia, unspecified: Secondary | ICD-10-CM | POA: Diagnosis not present

## 2020-05-30 DIAGNOSIS — C880 Waldenstrom macroglobulinemia: Secondary | ICD-10-CM | POA: Diagnosis not present

## 2020-05-30 DIAGNOSIS — Z5111 Encounter for antineoplastic chemotherapy: Secondary | ICD-10-CM | POA: Diagnosis not present

## 2020-05-30 DIAGNOSIS — Z5112 Encounter for antineoplastic immunotherapy: Secondary | ICD-10-CM | POA: Diagnosis not present

## 2020-05-30 DIAGNOSIS — Z8673 Personal history of transient ischemic attack (TIA), and cerebral infarction without residual deficits: Secondary | ICD-10-CM | POA: Diagnosis not present

## 2020-05-30 LAB — CBC WITH DIFFERENTIAL (CANCER CENTER ONLY)
Abs Immature Granulocytes: 0.15 10*3/uL — ABNORMAL HIGH (ref 0.00–0.07)
Basophils Absolute: 0 10*3/uL (ref 0.0–0.1)
Basophils Relative: 1 %
Eosinophils Absolute: 0.1 10*3/uL (ref 0.0–0.5)
Eosinophils Relative: 2 %
HCT: 21.4 % — ABNORMAL LOW (ref 36.0–46.0)
Hemoglobin: 8 g/dL — ABNORMAL LOW (ref 12.0–15.0)
Immature Granulocytes: 2 %
Lymphocytes Relative: 12 %
Lymphs Abs: 0.7 10*3/uL (ref 0.7–4.0)
MCH: 34.5 pg — ABNORMAL HIGH (ref 26.0–34.0)
MCHC: 37.4 g/dL — ABNORMAL HIGH (ref 30.0–36.0)
MCV: 92.2 fL (ref 80.0–100.0)
Monocytes Absolute: 0.5 10*3/uL (ref 0.1–1.0)
Monocytes Relative: 8 %
Neutro Abs: 4.6 10*3/uL (ref 1.7–7.7)
Neutrophils Relative %: 75 %
Platelet Count: 562 10*3/uL — ABNORMAL HIGH (ref 150–400)
RBC: 2.32 MIL/uL — ABNORMAL LOW (ref 3.87–5.11)
RDW: 23.8 % — ABNORMAL HIGH (ref 11.5–15.5)
WBC Count: 6.2 10*3/uL (ref 4.0–10.5)
nRBC: 0 % (ref 0.0–0.2)

## 2020-05-30 LAB — CMP (CANCER CENTER ONLY)
ALT: 11 U/L (ref 0–44)
AST: 27 U/L (ref 15–41)
Albumin: 2.4 g/dL — ABNORMAL LOW (ref 3.5–5.0)
Alkaline Phosphatase: 113 U/L (ref 38–126)
Anion gap: 10 (ref 5–15)
BUN: 29 mg/dL — ABNORMAL HIGH (ref 8–23)
CO2: 25 mmol/L (ref 22–32)
Calcium: 9.8 mg/dL (ref 8.9–10.3)
Chloride: 101 mmol/L (ref 98–111)
Creatinine: 1.68 mg/dL — ABNORMAL HIGH (ref 0.44–1.00)
GFR, Estimated: 29 mL/min — ABNORMAL LOW (ref >60)
Glucose, Bld: 117 mg/dL — ABNORMAL HIGH (ref 70–99)
Potassium: 4.1 mmol/L (ref 3.5–5.1)
Sodium: 136 mmol/L (ref 135–145)
Total Bilirubin: 1.1 mg/dL (ref 0.3–1.2)
Total Protein: 9.5 g/dL — ABNORMAL HIGH (ref 6.5–8.1)

## 2020-05-30 LAB — LACTATE DEHYDROGENASE: LDH: 205 U/L — ABNORMAL HIGH (ref 98–192)

## 2020-05-30 LAB — URIC ACID: Uric Acid, Serum: 8.1 mg/dL — ABNORMAL HIGH (ref 2.5–7.1)

## 2020-05-30 NOTE — Telephone Encounter (Signed)
Scheduled appointments per 10/28 los. Spoke to patient who is aware of appointments date and time. Gave patient calendar print out.

## 2020-05-30 NOTE — Progress Notes (Signed)
[place] OFFICE PROGRESS NOTE   Diagnosis: Non-Hodgkin's lymphoma  INTERVAL HISTORY:   Natasha Ellis returns for a scheduled visit.  She continues to have pain at the left "hip "and right arm.  The pain is partially relieved with a Duragesic patch.  She has not taken oxycodone for the past few days.  No fever.  Dyspnea has improved.  She has a poor appetite.  Objective:  Vital signs in last 24 hours:  Blood pressure 125/65, pulse 86, temperature (!) 97.2 F (36.2 C), temperature source Tympanic, resp. rate 16, height 5\' 6"  (1.676 m), weight 258 lb 3.2 oz (117.1 kg), SpO2 93 %.    HEENT: No thrush or ulcers Lymphatics: No cervical or supraclavicular nodes Resp: Bilateral expiratory rhonchi and wheeze, no respiratory distress Cardio: Regular rate and rhythm GI: No hepatosplenomegaly, nontender Vascular: Trace lower leg and pedal edema bilaterally Neuro: Alert and oriented     Lab Results:  Lab Results  Component Value Date   WBC 6.2 05/30/2020   HGB 8.0 (L) 05/30/2020   HCT 21.4 (L) 05/30/2020   MCV 92.2 05/30/2020   PLT 562 (H) 05/30/2020   NEUTROABS 4.6 05/30/2020    CMP  Lab Results  Component Value Date   NA 136 05/30/2020   K 4.1 05/30/2020   CL 101 05/30/2020   CO2 25 05/30/2020   GLUCOSE 117 (H) 05/30/2020   BUN 29 (H) 05/30/2020   CREATININE 1.68 (H) 05/30/2020   CALCIUM 9.8 05/30/2020   PROT 9.5 (H) 05/30/2020   ALBUMIN 2.4 (L) 05/30/2020   AST 27 05/30/2020   ALT 11 05/30/2020   ALKPHOS 113 05/30/2020   BILITOT 1.1 05/30/2020   GFRNONAA 29 (L) 05/30/2020   GFRAA 44 (L) 04/05/2020     Medications: I have reviewed the patient's current medications.   Assessment/Plan: 1. 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 Treated with multiple systemic therapies  including pentostatin/Cytoxan/rituximab in 2010.   Cycle 1 bendamustine/Rituxan 05/31/2014.  Cycle 2 bendamustine/Rituxan 07/02/2014  Cycle 3 bendamustine/rituximab 07/30/2014  Cycle 4 bendamustine/rituximab 09/10/2014  Cycle 5 bendamustine/rituximab 10/22/2014  Cycle 6 bendamustine/rituximab 12/10/2014  PET scan 03/27/2019-diffuse small hypermetabolic lymph nodes in the neck, chest, abdomen, and pelvis, heterogenous hypermetabolism in the bones  CT chest 10/02/2019-bronchiectasis at the lung bases, 13 mm nodular area at the medial left hemidiaphragm, mildly enlarged mediastinal nodes-decreased from June 2020, slightly enlarged left axillary node-9 mm  Rising [name] level March and April 2021  Cycle 1 bendamustine/rituximab 11/27/2019  Cycle 2 bendamustine/Rituxan 12/26/2019  01/22/2020 [name] 4225 (stable)   Cycle 1 cyclophosphamide, pentostatin, rituximab 05/09/2020  Cycle 2 cyclophosphamide, pentostatin, rituximab 05/31/2020 2. Left knee arthritis. Followed by Dr. Wynelle Link. 3. Sleep apnea. 4. Anemia. microcytic with peripheral blood smear findings consistent with iron deficiency 07/09/2015, serum iron studies consistent with "anemia of chronic disease ", stool Hemoccult cards negative 07/11/2015 5. Question left parotitis 12/12/2013 presenting with left facial and parotid edema. 6. Admission with pneumonia February 2016 7. History of Neutropenia secondary to chemotherapy, now with moderate neutropenia 8. Left lung pneumonia 04/02/2015, sputum culture positive for Moraxella 06/19/2015 9. Low IgG and IgA 10. CT chest 05/13/2015 with bilateral lower lung consolidation 11. CT chest, abdomen, and pelvis 01/08/2016-new peribronchial thickening and peripheral tree in bud pattern at the right lower lobe, no lymphadenopathy 12. Sinus mucosal thickening/opacification on  CT 08/16/2015-status post sinus surgery 12/05/2015 13. Sputum culture positive for pseudomonas aeruginosa on  01/10/2016-treated with 10 days of ciprofloxacin 14. 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 Admission with pneumonia 08/26/2016 16. Left lung nodule increased in size on a CT 01/06/2019, not hypermetabolic on the PET scan 10/22/252, followed by Dr. Elsworth Soho 17.  Admission with rectal bleeding 01/02/2020-diverticular? 18.  Pain at the right arm and left iliac-likely related to lymphoma, plain x-ray 05/07/2020 at [facility] revealed destructive changes in the right humerus and radius, x-ray of the pelvis 05/15/2020-subtle lucent lesion in the right ilium     Disposition: Natasha Ellis appears unchanged.  The white count has recovered.  Her pain has not improved following cycle 1 cyclophosphamide, pentostatin, and rituximab.  She would like to proceed with cycle 2 tomorrow.  Natasha Ellis will continue allopurinol.  We will arrange for a nadir CBC at the [place] home next week.  She will return for an office visit and chemotherapy in 3 weeks.  Natasha Ellis will continue the current pain regimen.  We will follow up on the [name] level from today.  [name], MD  05/30/2020  10:28 AM

## 2020-05-31 ENCOUNTER — Inpatient Hospital Stay: Payer: Medicare Other

## 2020-05-31 ENCOUNTER — Other Ambulatory Visit: Payer: Self-pay | Admitting: Nurse Practitioner

## 2020-05-31 VITALS — BP 114/58 | HR 73 | Temp 98.2°F | Resp 18

## 2020-05-31 DIAGNOSIS — D709 Neutropenia, unspecified: Secondary | ICD-10-CM | POA: Diagnosis not present

## 2020-05-31 DIAGNOSIS — Z5112 Encounter for antineoplastic immunotherapy: Secondary | ICD-10-CM | POA: Diagnosis not present

## 2020-05-31 DIAGNOSIS — C880 Waldenstrom macroglobulinemia: Secondary | ICD-10-CM | POA: Diagnosis not present

## 2020-05-31 DIAGNOSIS — G4730 Sleep apnea, unspecified: Secondary | ICD-10-CM | POA: Diagnosis not present

## 2020-05-31 DIAGNOSIS — Z8673 Personal history of transient ischemic attack (TIA), and cerebral infarction without residual deficits: Secondary | ICD-10-CM | POA: Diagnosis not present

## 2020-05-31 DIAGNOSIS — Z5111 Encounter for antineoplastic chemotherapy: Secondary | ICD-10-CM | POA: Diagnosis not present

## 2020-05-31 LAB — PROTEIN ELECTROPHORESIS, SERUM
A/G Ratio: 0.4 — ABNORMAL LOW (ref 0.7–1.7)
Albumin ELP: 2.7 g/dL — ABNORMAL LOW (ref 2.9–4.4)
Alpha-1-Globulin: 0.4 g/dL (ref 0.0–0.4)
Alpha-2-Globulin: 1.1 g/dL — ABNORMAL HIGH (ref 0.4–1.0)
Beta Globulin: 0.8 g/dL (ref 0.7–1.3)
Gamma Globulin: 3.8 g/dL — ABNORMAL HIGH (ref 0.4–1.8)
Globulin, Total: 6.1 g/dL — ABNORMAL HIGH (ref 2.2–3.9)
M-Spike, %: 3.6 g/dL — ABNORMAL HIGH
Total Protein ELP: 8.8 g/dL — ABNORMAL HIGH (ref 6.0–8.5)

## 2020-05-31 LAB — IGM: IgM (Immunoglobulin M), Srm: 5785 mg/dL — ABNORMAL HIGH (ref 26–217)

## 2020-05-31 MED ORDER — PALONOSETRON HCL INJECTION 0.25 MG/5ML
0.2500 mg | Freq: Once | INTRAVENOUS | Status: AC
  Administered 2020-05-31: 0.25 mg via INTRAVENOUS

## 2020-05-31 MED ORDER — DIPHENHYDRAMINE HCL 25 MG PO CAPS
50.0000 mg | ORAL_CAPSULE | Freq: Once | ORAL | Status: AC
  Administered 2020-05-31: 50 mg via ORAL

## 2020-05-31 MED ORDER — SODIUM CHLORIDE 0.9 % IV SOLN
400.0000 mg/m2 | Freq: Once | INTRAVENOUS | Status: AC
  Administered 2020-05-31: 920 mg via INTRAVENOUS
  Filled 2020-05-31: qty 46

## 2020-05-31 MED ORDER — ACETAMINOPHEN 325 MG PO TABS
650.0000 mg | ORAL_TABLET | Freq: Once | ORAL | Status: AC
  Administered 2020-05-31: 650 mg via ORAL

## 2020-05-31 MED ORDER — ACETAMINOPHEN 325 MG PO TABS
ORAL_TABLET | ORAL | Status: AC
  Filled 2020-05-31: qty 2

## 2020-05-31 MED ORDER — SODIUM CHLORIDE 0.9 % IV SOLN
10.0000 mg | Freq: Once | INTRAVENOUS | Status: AC
  Administered 2020-05-31: 10 mg via INTRAVENOUS
  Filled 2020-05-31: qty 10

## 2020-05-31 MED ORDER — DIPHENHYDRAMINE HCL 25 MG PO CAPS
ORAL_CAPSULE | ORAL | Status: AC
  Filled 2020-05-31: qty 2

## 2020-05-31 MED ORDER — DIPHENHYDRAMINE HCL 50 MG/ML IJ SOLN
INTRAMUSCULAR | Status: AC
  Filled 2020-05-31: qty 1

## 2020-05-31 MED ORDER — SODIUM CHLORIDE 0.9 % IV SOLN
Freq: Once | INTRAVENOUS | Status: AC
  Filled 2020-05-31: qty 250

## 2020-05-31 MED ORDER — SODIUM CHLORIDE 0.9 % IV SOLN
2.1000 mg/m2 | Freq: Once | INTRAVENOUS | Status: AC
  Administered 2020-05-31: 4.8 mg via INTRAVENOUS
  Filled 2020-05-31: qty 2.4

## 2020-05-31 MED ORDER — PALONOSETRON HCL INJECTION 0.25 MG/5ML
INTRAVENOUS | Status: AC
  Filled 2020-05-31: qty 5

## 2020-05-31 MED ORDER — SODIUM CHLORIDE 0.9 % IV SOLN
375.0000 mg/m2 | Freq: Once | INTRAVENOUS | Status: AC
  Administered 2020-05-31: 900 mg via INTRAVENOUS
  Filled 2020-05-31: qty 50

## 2020-05-31 NOTE — Patient Instructions (Signed)
[place] Discharge Instructions for Patients Receiving Chemotherapy  Today you received the following chemotherapy agents pentostatin, cytoxan, and rituxan  To help prevent nausea and vomiting after your treatment, we encourage you to take your nausea medication as directed  If you develop nausea and vomiting that is not controlled by your nausea medication, call the clinic.   BELOW ARE SYMPTOMS THAT SHOULD BE REPORTED IMMEDIATELY:  *FEVER GREATER THAN 100.5 F  *CHILLS WITH OR WITHOUT FEVER  NAUSEA AND VOMITING THAT IS NOT CONTROLLED WITH YOUR NAUSEA MEDICATION  *UNUSUAL SHORTNESS OF BREATH  *UNUSUAL BRUISING OR BLEEDING  TENDERNESS IN MOUTH AND THROAT WITH OR WITHOUT PRESENCE OF ULCERS  *URINARY PROBLEMS  *BOWEL PROBLEMS  UNUSUAL RASH Items with * indicate a potential emergency and should be followed up as soon as possible.  Feel free to call the clinic should you have any questions or concerns. The clinic phone number is [phone].  Please show the [facility] at check-in to the Emergency Department and triage nurse.

## 2020-05-31 NOTE — Progress Notes (Signed)
Per MD okay to treat with Crt. 1.68  Pt experiencing audible crackles.  Per Ned Card, NP pt will receive 500 mL NS pre hydration instead of 1,000 mL NS and pt will receive 500 mL NS post hydration to prevent fluid overload.

## 2020-05-31 NOTE — Addendum Note (Signed)
Addended by: Tania Ade on: 05/31/2020 04:52 PM   Modules accepted: Orders

## 2020-06-02 ENCOUNTER — Other Ambulatory Visit: Payer: Self-pay

## 2020-06-02 ENCOUNTER — Emergency Department (HOSPITAL_COMMUNITY): Payer: Medicare Other

## 2020-06-02 ENCOUNTER — Encounter (HOSPITAL_COMMUNITY): Payer: Self-pay | Admitting: Emergency Medicine

## 2020-06-02 ENCOUNTER — Inpatient Hospital Stay (HOSPITAL_COMMUNITY)
Admission: EM | Admit: 2020-06-02 | Discharge: 2020-06-12 | DRG: 193 | Disposition: A | Discharge status: Skilled Nursing Facility | Payer: Medicare Other | Source: Skilled Nursing Facility | Attending: Internal Medicine | Admitting: Internal Medicine

## 2020-06-02 ENCOUNTER — Other Ambulatory Visit: Payer: Self-pay | Admitting: Oncology

## 2020-06-02 DIAGNOSIS — K8020 Calculus of gallbladder without cholecystitis without obstruction: Secondary | ICD-10-CM | POA: Diagnosis present

## 2020-06-02 DIAGNOSIS — K219 Gastro-esophageal reflux disease without esophagitis: Secondary | ICD-10-CM | POA: Diagnosis present

## 2020-06-02 DIAGNOSIS — A419 Sepsis, unspecified organism: Secondary | ICD-10-CM | POA: Diagnosis not present

## 2020-06-02 DIAGNOSIS — M170 Bilateral primary osteoarthritis of knee: Secondary | ICD-10-CM | POA: Diagnosis present

## 2020-06-02 DIAGNOSIS — S3210XA Unspecified fracture of sacrum, initial encounter for closed fracture: Secondary | ICD-10-CM | POA: Diagnosis present

## 2020-06-02 DIAGNOSIS — D630 Anemia in neoplastic disease: Secondary | ICD-10-CM | POA: Diagnosis present

## 2020-06-02 DIAGNOSIS — J440 Chronic obstructive pulmonary disease with acute lower respiratory infection: Secondary | ICD-10-CM | POA: Diagnosis present

## 2020-06-02 DIAGNOSIS — Z823 Family history of stroke: Secondary | ICD-10-CM

## 2020-06-02 DIAGNOSIS — I480 Paroxysmal atrial fibrillation: Secondary | ICD-10-CM | POA: Diagnosis not present

## 2020-06-02 DIAGNOSIS — D649 Anemia, unspecified: Secondary | ICD-10-CM | POA: Diagnosis present

## 2020-06-02 DIAGNOSIS — C8800 Waldenstrom macroglobulinemia not having achieved remission: Secondary | ICD-10-CM | POA: Diagnosis present

## 2020-06-02 DIAGNOSIS — J9601 Acute respiratory failure with hypoxia: Secondary | ICD-10-CM | POA: Diagnosis not present

## 2020-06-02 DIAGNOSIS — C7952 Secondary malignant neoplasm of bone marrow: Secondary | ICD-10-CM | POA: Diagnosis present

## 2020-06-02 DIAGNOSIS — Z882 Allergy status to sulfonamides status: Secondary | ICD-10-CM

## 2020-06-02 DIAGNOSIS — C8590 Non-Hodgkin lymphoma, unspecified, unspecified site: Secondary | ICD-10-CM | POA: Diagnosis present

## 2020-06-02 DIAGNOSIS — Z7401 Bed confinement status: Secondary | ICD-10-CM

## 2020-06-02 DIAGNOSIS — N1831 Chronic kidney disease, stage 3a: Secondary | ICD-10-CM | POA: Diagnosis present

## 2020-06-02 DIAGNOSIS — Z803 Family history of malignant neoplasm of breast: Secondary | ICD-10-CM

## 2020-06-02 DIAGNOSIS — D701 Agranulocytosis secondary to cancer chemotherapy: Secondary | ICD-10-CM | POA: Diagnosis present

## 2020-06-02 DIAGNOSIS — Z885 Allergy status to narcotic agent status: Secondary | ICD-10-CM

## 2020-06-02 DIAGNOSIS — Z20822 Contact with and (suspected) exposure to covid-19: Secondary | ICD-10-CM | POA: Diagnosis present

## 2020-06-02 DIAGNOSIS — I10 Essential (primary) hypertension: Secondary | ICD-10-CM | POA: Diagnosis present

## 2020-06-02 DIAGNOSIS — N1832 Chronic kidney disease, stage 3b: Secondary | ICD-10-CM | POA: Diagnosis present

## 2020-06-02 DIAGNOSIS — Z6841 Body Mass Index (BMI) 40.0 and over, adult: Secondary | ICD-10-CM

## 2020-06-02 DIAGNOSIS — R5381 Other malaise: Secondary | ICD-10-CM | POA: Diagnosis not present

## 2020-06-02 DIAGNOSIS — M4856XA Collapsed vertebra, not elsewhere classified, lumbar region, initial encounter for fracture: Secondary | ICD-10-CM | POA: Diagnosis not present

## 2020-06-02 DIAGNOSIS — I4510 Unspecified right bundle-branch block: Secondary | ICD-10-CM | POA: Diagnosis present

## 2020-06-02 DIAGNOSIS — K828 Other specified diseases of gallbladder: Secondary | ICD-10-CM | POA: Diagnosis not present

## 2020-06-02 DIAGNOSIS — N179 Acute kidney failure, unspecified: Secondary | ICD-10-CM | POA: Diagnosis present

## 2020-06-02 DIAGNOSIS — C7951 Secondary malignant neoplasm of bone: Secondary | ICD-10-CM | POA: Diagnosis present

## 2020-06-02 DIAGNOSIS — Z79899 Other long term (current) drug therapy: Secondary | ICD-10-CM

## 2020-06-02 DIAGNOSIS — S329XXA Fracture of unspecified parts of lumbosacral spine and pelvis, initial encounter for closed fracture: Secondary | ICD-10-CM | POA: Diagnosis present

## 2020-06-02 DIAGNOSIS — G4733 Obstructive sleep apnea (adult) (pediatric): Secondary | ICD-10-CM | POA: Diagnosis present

## 2020-06-02 DIAGNOSIS — E785 Hyperlipidemia, unspecified: Secondary | ICD-10-CM | POA: Diagnosis present

## 2020-06-02 DIAGNOSIS — N183 Chronic kidney disease, stage 3 unspecified: Secondary | ICD-10-CM | POA: Diagnosis present

## 2020-06-02 DIAGNOSIS — M1612 Unilateral primary osteoarthritis, left hip: Secondary | ICD-10-CM | POA: Diagnosis not present

## 2020-06-02 DIAGNOSIS — R509 Fever, unspecified: Secondary | ICD-10-CM | POA: Diagnosis not present

## 2020-06-02 DIAGNOSIS — Z66 Do not resuscitate: Secondary | ICD-10-CM | POA: Diagnosis present

## 2020-06-02 DIAGNOSIS — M8458XA Pathological fracture in neoplastic disease, other specified site, initial encounter for fracture: Secondary | ICD-10-CM | POA: Diagnosis not present

## 2020-06-02 DIAGNOSIS — I129 Hypertensive chronic kidney disease with stage 1 through stage 4 chronic kidney disease, or unspecified chronic kidney disease: Secondary | ICD-10-CM | POA: Diagnosis present

## 2020-06-02 DIAGNOSIS — Z888 Allergy status to other drugs, medicaments and biological substances status: Secondary | ICD-10-CM

## 2020-06-02 DIAGNOSIS — J189 Pneumonia, unspecified organism: Secondary | ICD-10-CM | POA: Diagnosis not present

## 2020-06-02 DIAGNOSIS — Z87891 Personal history of nicotine dependence: Secondary | ICD-10-CM

## 2020-06-02 DIAGNOSIS — J188 Other pneumonia, unspecified organism: Secondary | ICD-10-CM | POA: Diagnosis present

## 2020-06-02 DIAGNOSIS — R0600 Dyspnea, unspecified: Secondary | ICD-10-CM

## 2020-06-02 DIAGNOSIS — G893 Neoplasm related pain (acute) (chronic): Secondary | ICD-10-CM | POA: Diagnosis present

## 2020-06-02 DIAGNOSIS — X58XXXA Exposure to other specified factors, initial encounter: Secondary | ICD-10-CM | POA: Diagnosis present

## 2020-06-02 DIAGNOSIS — Z7901 Long term (current) use of anticoagulants: Secondary | ICD-10-CM

## 2020-06-02 DIAGNOSIS — Z8673 Personal history of transient ischemic attack (TIA), and cerebral infarction without residual deficits: Secondary | ICD-10-CM

## 2020-06-02 DIAGNOSIS — Z8249 Family history of ischemic heart disease and other diseases of the circulatory system: Secondary | ICD-10-CM

## 2020-06-02 DIAGNOSIS — F419 Anxiety disorder, unspecified: Secondary | ICD-10-CM | POA: Diagnosis present

## 2020-06-02 DIAGNOSIS — J9811 Atelectasis: Secondary | ICD-10-CM | POA: Diagnosis not present

## 2020-06-02 LAB — URINALYSIS, ROUTINE W REFLEX MICROSCOPIC
Bilirubin Urine: NEGATIVE
Glucose, UA: NEGATIVE mg/dL
Hgb urine dipstick: NEGATIVE
Ketones, ur: NEGATIVE mg/dL
Nitrite: NEGATIVE
Protein, ur: NEGATIVE mg/dL
Specific Gravity, Urine: 1.015 (ref 1.005–1.030)
pH: 5 (ref 5.0–8.0)

## 2020-06-02 LAB — LACTIC ACID, PLASMA
Lactic Acid, Venous: 2.3 mmol/L (ref 0.5–1.9)
Lactic Acid, Venous: 2.3 mmol/L (ref 0.5–1.9)

## 2020-06-02 LAB — CBC WITH DIFFERENTIAL/PLATELET
Abs Immature Granulocytes: 0.13 10*3/uL — ABNORMAL HIGH (ref 0.00–0.07)
Basophils Absolute: 0 10*3/uL (ref 0.0–0.1)
Basophils Relative: 0 %
Eosinophils Absolute: 0.1 10*3/uL (ref 0.0–0.5)
Eosinophils Relative: 1 %
HCT: 21.5 % — ABNORMAL LOW (ref 36.0–46.0)
Hemoglobin: 7.3 g/dL — ABNORMAL LOW (ref 12.0–15.0)
Immature Granulocytes: 1 %
Lymphocytes Relative: 3 %
Lymphs Abs: 0.3 10*3/uL — ABNORMAL LOW (ref 0.7–4.0)
MCH: 33.8 pg (ref 26.0–34.0)
MCHC: 34 g/dL (ref 30.0–36.0)
MCV: 99.5 fL (ref 80.0–100.0)
Monocytes Absolute: 0.5 10*3/uL (ref 0.1–1.0)
Monocytes Relative: 5 %
Neutro Abs: 8.1 10*3/uL — ABNORMAL HIGH (ref 1.7–7.7)
Neutrophils Relative %: 90 %
Platelets: 534 10*3/uL — ABNORMAL HIGH (ref 150–400)
RBC: 2.16 MIL/uL — ABNORMAL LOW (ref 3.87–5.11)
RDW: 24.8 % — ABNORMAL HIGH (ref 11.5–15.5)
WBC: 9.1 10*3/uL (ref 4.0–10.5)
nRBC: 0 % (ref 0.0–0.2)

## 2020-06-02 LAB — COMPREHENSIVE METABOLIC PANEL
ALT: 7 U/L (ref 0–44)
AST: 18 U/L (ref 15–41)
Albumin: 2.5 g/dL — ABNORMAL LOW (ref 3.5–5.0)
Alkaline Phosphatase: 91 U/L (ref 38–126)
Anion gap: 14 (ref 5–15)
BUN: 41 mg/dL — ABNORMAL HIGH (ref 8–23)
CO2: 22 mmol/L (ref 22–32)
Calcium: 9.1 mg/dL (ref 8.9–10.3)
Chloride: 102 mmol/L (ref 98–111)
Creatinine, Ser: 2.01 mg/dL — ABNORMAL HIGH (ref 0.44–1.00)
GFR, Estimated: 24 mL/min — ABNORMAL LOW (ref >60)
Glucose, Bld: 111 mg/dL — ABNORMAL HIGH (ref 70–99)
Potassium: 4.7 mmol/L (ref 3.5–5.1)
Sodium: 138 mmol/L (ref 135–145)
Total Bilirubin: 0.9 mg/dL (ref 0.3–1.2)
Total Protein: 8.5 g/dL — ABNORMAL HIGH (ref 6.5–8.1)

## 2020-06-02 LAB — PROTIME-INR
INR: 1.4 — ABNORMAL HIGH (ref 0.8–1.2)
Prothrombin Time: 17 seconds — ABNORMAL HIGH (ref 11.4–15.2)

## 2020-06-02 LAB — RESPIRATORY PANEL BY RT PCR (FLU A&B, COVID)
Influenza A by PCR: NEGATIVE
Influenza B by PCR: NEGATIVE
SARS Coronavirus 2 by RT PCR: NEGATIVE

## 2020-06-02 LAB — APTT: aPTT: 29 seconds (ref 24–36)

## 2020-06-02 MED ORDER — OXYCODONE-ACETAMINOPHEN 5-325 MG PO TABS
1.0000 | ORAL_TABLET | Freq: Once | ORAL | Status: AC
  Administered 2020-06-02: 1 via ORAL
  Filled 2020-06-02: qty 1

## 2020-06-02 MED ORDER — SODIUM CHLORIDE 0.9 % IV SOLN
500.0000 mg | INTRAVENOUS | Status: DC
  Filled 2020-06-02: qty 500

## 2020-06-02 MED ORDER — FENTANYL CITRATE (PF) 100 MCG/2ML IJ SOLN
50.0000 ug | Freq: Once | INTRAMUSCULAR | Status: AC
  Administered 2020-06-02: 50 ug via INTRAVENOUS
  Filled 2020-06-02: qty 2

## 2020-06-02 MED ORDER — SODIUM CHLORIDE 0.9 % IV SOLN
500.0000 mg | INTRAVENOUS | Status: DC
  Administered 2020-06-03: 500 mg via INTRAVENOUS
  Filled 2020-06-02 (×2): qty 500

## 2020-06-02 MED ORDER — FENTANYL 25 MCG/HR TD PT72
1.0000 | MEDICATED_PATCH | TRANSDERMAL | Status: DC
  Administered 2020-06-03: 1 via TRANSDERMAL
  Filled 2020-06-02: qty 1

## 2020-06-02 MED ORDER — LACTATED RINGERS IV SOLN: INTRAVENOUS | Status: DC

## 2020-06-02 MED ORDER — SODIUM CHLORIDE 0.9 % IV SOLN
2.0000 g | INTRAVENOUS | Status: AC
  Administered 2020-06-03 – 2020-06-07 (×5): 2 g via INTRAVENOUS
  Filled 2020-06-02: qty 20
  Filled 2020-06-02 (×4): qty 2

## 2020-06-02 MED ORDER — CIPROFLOXACIN IN D5W 400 MG/200ML IV SOLN
400.0000 mg | Freq: Once | INTRAVENOUS | Status: AC
  Administered 2020-06-02: 400 mg via INTRAVENOUS
  Filled 2020-06-02: qty 200

## 2020-06-02 MED ORDER — FENTANYL CITRATE (PF) 100 MCG/2ML IJ SOLN
25.0000 ug | INTRAMUSCULAR | Status: DC | PRN
  Administered 2020-06-03 (×2): 50 ug via INTRAVENOUS
  Filled 2020-06-02 (×3): qty 2

## 2020-06-02 MED ORDER — APIXABAN 2.5 MG PO TABS
2.5000 mg | ORAL_TABLET | Freq: Two times a day (BID) | ORAL | Status: DC
  Administered 2020-06-02 – 2020-06-12 (×20): 2.5 mg via ORAL
  Filled 2020-06-02 (×21): qty 1

## 2020-06-02 MED ORDER — SODIUM CHLORIDE 0.9 % IV BOLUS
500.0000 mL | Freq: Once | INTRAVENOUS | Status: AC
  Administered 2020-06-02: 500 mL via INTRAVENOUS

## 2020-06-02 MED ORDER — ENOXAPARIN SODIUM 40 MG/0.4ML ~~LOC~~ SOLN: 40.0000 mg | SUBCUTANEOUS | Status: DC

## 2020-06-02 NOTE — ED Provider Notes (Signed)
[facility] DEPT Provider Note   CSN: [id number] Arrival date & time: 06/02/20  1453     History Chief Complaint  Patient presents with  . Fever    Natasha Ellis is a 86 y.o. female.  The history is provided by the patient.  Fever  Natasha Ellis is a 86 y.o. female who presents to the Emergency Department complaining of fever.  She presents to the ED from [facility] via EMS for evaluation of fever that started today. She is currently on chemotherapy. She had a temperature to 99.7 at her facility. She reports feeling very well yesterday but this morning had severe fatigue, body aches, increased pain in her left hip and shaking chills. She has experienced left hip pain since August. She has mild cough. Denies any nausea, vomiting, diarrhea. She has been fully vaccinated for COVID-19.    Past Medical History:  Diagnosis Date  . Arthritis   . Asthma   . Cancer ([place])    Lymphoma  . Cataract   . Chronic cough   . Diverticulosis of intestine    H/O  . DJD (degenerative joint disease) of lumbar spine   . IBS (irritable bowel syndrome)   . Left knee DJD   . 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 Right knee DJD   . Sleep apnea     Patient Active Problem List   Diagnosis Date Noted  . 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 Rectal bleeding 01/02/2020  . PAF (paroxysmal atrial fibrillation) ([place]) 01/02/2020  . Goals of care, counseling/discussion 11/14/2019  . CKD (chronic kidney disease) stage 3, GFR 30-59 ml/min (HCC) 01/08/2019  . AKI (acute kidney injury) ([place]) 01/08/2019  . Physical deconditioning 09/15/2018  . Bronchiectasis ([place]) 09/06/2018  . Lung nodule 09/06/2018  . Ascending aortic aneurysm ([place]) 09/06/2018  . Hypoxia 08/15/2016  . Severe obesity (BMI >= 40) ([place]) 07/30/2015  . Multifocal pneumonia 06/19/2015  . Anemia 06/10/2015  . Anemia due to  other cause 06/10/2015  . OSA (obstructive sleep apnea) 09/20/2014  . History of lymphoma 09/20/2014  . History of TIA (transient ischemic attack) 09/20/2014  . Diarrhea   . Pain in gums 08/15/2014  . Neutropenia ([place]) 08/15/2014  . Hypersensitivity reaction 05/31/2014  . Waldenstrom's macroglobulinemia ([place]) 04/19/2014  . HTN (hypertension) 04/29/2013  . HLD (hyperlipidemia) 04/29/2013  . Lacunar infarction ([place]) 04/29/2013  . GERD (gastroesophageal reflux disease) 04/29/2013  . TIA (transient ischemic attack) 04/29/2013  . Back pain 04/29/2013  . Asthma 04/29/2013  . Dyspnea 01/25/2013  . Chronic cough 01/01/2013    Past Surgical History:  Procedure Laterality Date  . ABDOMINAL HYSTERECTOMY  1984   TAH  . EYE SURGERY Bilateral    with lens replacement  . EYE SURGERY Left    retnia  . KNEE ARTHROSCOPY Left   . NASAL SINUS SURGERY N/A 12/04/2015   Procedure: ENDOSCOPIC SINUS SURGERY;  Surgeon: Izora Gala, MD;  Location: [facility];  Service: ENT;  Laterality: N/A;  . TEE WITHOUT CARDIOVERSION N/A 04/14/2016   Procedure: TRANSESOPHAGEAL ECHOCARDIOGRAM (TEE);  Surgeon: Lelon Perla, MD;  Location: [facility] ENDOSCOPY;  Service: Cardiovascular;  Laterality: N/A;  . TONSILLECTOMY AND ADENOIDECTOMY  childhood     OB History    Gravida  0   Para  0   Term  0   Preterm  0   AB  0   Living  SAB  0   TAB  0   Ectopic  0   Multiple      Live Births              Family History  Problem Relation Age of Onset  . Stroke Father   . Heart failure Mother 100  . Breast cancer Sister     Social History   Tobacco Use  . Smoking status: Former Smoker    Packs/day: 1.00    Years: 25.00    Pack years: 25.00    Types: Cigarettes    Quit date: 08/04/1979    Years since quitting: 40.8  . Smokeless tobacco: Never Used  Vaping Use  . Vaping Use: Never used  Substance Use Topics  . Alcohol use: No  . Drug use: No    Home Medications Prior to Admission  medications   Medication Sig Start Date End Date Taking? Authorizing Provider  acetaminophen (TYLENOL) 500 MG tablet Take 1,000 mg by mouth every 6 (six) hours as needed for pain.     [provider]  Albuterol Sulfate 2.5 MG/0.5ML NEBU Inhale 0.5 mLs (2.5 mg total) into the lungs every 6 (six) hours as needed (sob/wheezing). Patient taking differently: Inhale 2.5 mg into the lungs 2 (two) times daily.  01/06/20   Aline August, MD  allopurinol (ZYLOPRIM) 100 MG tablet Take 1 tablet (100 mg total) by mouth daily. Patient taking differently: Take 100 mg by mouth 2 (two) times daily.  05/09/20   Ladell Pier, MD  apixaban (ELIQUIS) 2.5 MG TABS tablet Take 1 tablet (2.5 mg total) by mouth 2 (two) times daily. 01/18/20   Sueanne Margarita, MD  calcium carbonate (TUMS - DOSED IN MG ELEMENTAL CALCIUM) 500 MG chewable tablet Chew 2 tablets by mouth as needed for indigestion or heartburn.    [provider]  calcium gluconate 500 MG tablet Take 1 tablet by mouth daily.    [provider]  cholestyramine (QUESTRAN) 4 g packet Take 4 g by mouth daily as needed. Patient not taking: Reported on 05/31/2020    [provider]  dextromethorphan (DELSYM) 30 MG/5ML liquid Take by mouth 2 (two) times daily as needed for cough. Patient not taking: Reported on 05/31/2020    [provider]  diclofenac Sodium (VOLTAREN) 1 % GEL Apply 1 application topically at bedtime.    [provider]  diltiazem (CARDIZEM CD) 180 MG 24 hr capsule Take 1 capsule (180 mg total) by mouth daily. Please make yearly appt with Dr. Radford Pax in August for future refills. 1st attempt 01/18/20   Sueanne Margarita, MD  docusate sodium (COLACE) 100 MG capsule Take 100 mg by mouth daily.    [provider]  esomeprazole (NEXIUM) 40 MG capsule Take 40 mg by mouth daily. 05/02/20   [provider]  fentaNYL (DURAGESIC) 25 MCG/HR 1 patch every 3 (three) days. 05/25/20   [provider]  ferrous sulfate 325 (65 FE) MG tablet Take 325 mg by mouth daily.     [provider]  fluticasone (FLONASE) 50 MCG/ACT nasal spray Place 1 spray into both nostrils 2 (two) times daily as needed for allergies or rhinitis. Patient not taking: Reported on 05/31/2020    [provider]  fluticasone furoate-vilanterol (BREO ELLIPTA) 100-25 MCG/INH AEPB Inhale 1 puff into the lungs daily. 12/28/19   Rigoberto Noel, MD  furosemide (LASIX) 40 MG tablet Take 40 mg by mouth daily.    [provider]  gabapentin (NEURONTIN) 100 MG capsule Take 100 mg by mouth 3 (three) times daily. 05/07/20   [provider]  guaiFENesin (MUCINEX) 600 MG 12 hr tablet Take 600 mg by mouth 2 (two) times daily.    [provider]  Ibuprofen 200 MG CAPS Take 200 mg by mouth 2 (two) times daily as needed.    [provider]  lactose free nutrition (BOOST) LIQD Take 237 mLs by mouth 3 (three) times daily between meals.    [provider]  naloxone [facility]) 0.4 MG/ML injection as needed. Patient not taking: Reported on 05/31/2020 05/27/20   [provider]  oxyCODONE-acetaminophen (PERCOCET) 7.5-325 MG tablet Take 1 tablet by mouth every 4 (four) hours as needed for severe pain.    [provider]  Polyethylene Glycol 3350 (MIRALAX PO) Take 17 g by mouth daily as needed.    [provider]  potassium chloride (K-DUR,KLOR-CON) 10 MEQ tablet Take 10 mEq by mouth daily.  10/11/17   [provider]  pramoxine-hydrocortisone (ANALPRAM HC) cream Apply topically daily as needed. Patient not taking: Reported on 05/31/2020    [provider]  Respiratory Therapy Supplies (FLUTTER) DEVI Use as directed. 09/06/18   Parrett, Fonnie Mu, NP    Allergies    Codeine, Amoxicillin, Cefdinir, Doxycycline, Erythromycin, Lisinopril, Oxybutynin, and Sulfa antibiotics  Review of Systems   Review of Systems  Constitutional: Positive  for fever.  All other systems reviewed and are negative.   Physical Exam Updated Vital Signs BP (!) 97/49   Pulse 94   Temp 99.5 F (37.5 C) (Rectal)   Resp (!) 30   Ht 5\' 6"  (1.676 m)   Wt 115.7 kg   SpO2 91%   BMI 41.16 kg/m   Physical Exam Vitals and nursing note reviewed.  Constitutional:      General: She is in acute distress.     Appearance: She is well-developed.     Comments: Uncomfortable appearing  HENT:     Head: Normocephalic and atraumatic.  Cardiovascular:     Rate and Rhythm: Normal rate and regular rhythm.     Heart sounds: No murmur heard.   Pulmonary:     Effort: Pulmonary effort is normal. No respiratory distress.     Breath sounds: Normal breath sounds.  Abdominal:     Palpations: Abdomen is soft.     Tenderness: There is no abdominal tenderness. There is no guarding or rebound.  Musculoskeletal:        General: No swelling or tenderness.     Comments: No hip tenderness to palpation.    Skin:    General: Skin is warm and dry.  Neurological:     Mental Status: She is alert and oriented to person, place, and time.     Comments: Generalized weakness  Psychiatric:        Behavior: Behavior normal.     ED Results / Procedures / Treatments   Labs (all labs ordered are listed, but only abnormal results are displayed) Labs Reviewed  LACTIC ACID, PLASMA - Abnormal; Notable for the following components:      Result Value   Lactic Acid, Venous 2.3 (*)    All other components within normal limits  LACTIC ACID, PLASMA - Abnormal; Notable for the following components:   Lactic Acid, Venous 2.3 (*)    All other components within normal limits  COMPREHENSIVE METABOLIC PANEL - Abnormal; Notable for the following components:   Glucose, Bld 111 (*)  BUN 41 (*)    Creatinine, Ser 2.01 (*)    Total Protein 8.5 (*)    Albumin 2.5 (*)    GFR, Estimated 24 (*)    All other components within normal limits  CBC WITH DIFFERENTIAL/PLATELET - Abnormal;  Notable for the following components:   RBC 2.16 (*)    Hemoglobin 7.3 (*)    HCT 21.5 (*)    RDW 24.8 (*)    Platelets 534 (*)    Neutro Abs 8.1 (*)    Lymphs Abs 0.3 (*)    Abs Immature Granulocytes 0.13 (*)    All other components within normal limits  PROTIME-INR - Abnormal; Notable for the following components:   Prothrombin Time 17.0 (*)    INR 1.4 (*)    All other components within normal limits  URINALYSIS, ROUTINE W REFLEX MICROSCOPIC - Abnormal; Notable for the following components:   APPearance HAZY (*)    Leukocytes,Ua TRACE (*)    Bacteria, UA RARE (*)    All other components within normal limits  RESPIRATORY PANEL BY RT PCR (FLU A&B, COVID)  URINE CULTURE  CULTURE, BLOOD (ROUTINE X 2)  CULTURE, BLOOD (ROUTINE X 2) W REFLEX TO ID PANEL  APTT  HIV ANTIBODY (ROUTINE TESTING W REFLEX)  CBC  COMPREHENSIVE METABOLIC PANEL  TYPE AND SCREEN    EKG EKG Interpretation  Date/Time:  Sunday June 02 2020 15:35:24 EDT Ventricular Rate:  94 PR Interval:    QRS Duration: 121 QT Interval:  343 QTC Calculation: 429 R Axis:   -13 Text Interpretation: Sinus rhythm Right bundle branch block Borderline ST depression, lateral leads Confirmed by Quintella Reichert [phone]) on 06/02/2020 4:52:56 PM   Radiology CT Abdomen Pelvis Wo Contrast  Result Date: 06/02/2020 CLINICAL DATA:  Abdominal pain and fever. EXAM: CT ABDOMEN AND PELVIS WITHOUT CONTRAST TECHNIQUE: Multidetector CT imaging of the abdomen and pelvis was performed following the standard protocol without IV contrast. COMPARISON:  CT dated January 03, 2020 FINDINGS: Lower chest: Multiple airspace opacities are noted at the lung bases bilaterally. There is associated bronchial wall thickening and mucous plugging. The heart size is normal. There are coronary artery calcifications.The heart size is normal. There is a trace left-sided pleural effusion. Hepatobiliary: The liver is normal. There is cholelithiasis with questionable  mild gallbladder wall thickening.There is no biliary ductal dilation. Pancreas: Normal contours without ductal dilatation. No peripancreatic fluid collection. Spleen: The spleen is borderline enlarged measuring approximately 12 cm craniocaudad. Adrenals/Urinary Tract: --Adrenal glands: The right adrenal gland is unremarkable. There is a left adrenal myelolipoma that is stable. --Right kidney/ureter: Th there is a stable small exophytic proteinaceous or hemorrhagic cyst arising from the lower pole the right kidney. There is no right-sided hydronephrosis. --Left kidney/ureter: There is no left-sided hydronephrosis. The left kidney is somewhat atrophic with areas of cortical thinning/scarring. --Urinary bladder: Unremarkable. Stomach/Bowel: --Stomach/Duodenum: No hiatal hernia or other gastric abnormality. Normal duodenal course and caliber. --Small bowel: Unremarkable. --Colon: There is scattered colonic diverticula without CT evidence for diverticulitis. There is a large amount of stool throughout the colon. --Appendix: Normal. Vascular/Lymphatic: Atherosclerotic calcification is present within the non-aneurysmal abdominal aorta, without hemodynamically significant stenosis. --No retroperitoneal lymphadenopathy. --No mesenteric lymphadenopathy. --there is a pathologically enlarged right inguinal lymph node measuring approximately 6 by 3.7 cm (axial series 2, image 71). Reproductive: Status post hysterectomy. No adnexal mass. Other: No ascites or free air. The abdominal wall is normal. Musculoskeletal. There are innumerable lytic lesions throughout the patient's pelvis. The  largest is located in the left posterior iliac crest and measures approximately 3.7 cm (axial series 2, image 59). There are small lytic lesions located in the proximal left femur that correspond well with the recent x-ray. There is a stable rounded hyperdense lesion within the marrow cavity of the proximal left femur. This is favored to be benign  given its stability. There is a probable nondisplaced zone 1 fracture through the right sacrum (axial series 2, image 54). There is a probable pathologic zone 1 fracture through the left inferior sacrum (axial series 2, image 66). There is transitional vertebral anatomy with 6 lumbar type vertebral bodies. There is an old compression fracture of the L4 vertebral body. There is a new compression fracture of the L5 vertebral body resulting in approximately 50% height loss. There are lytic lesions throughout the visualized ribs. IMPRESSION: 1. Innumerable lytic lesions throughout the pelvic bones and ribs consistent with osseous metastatic disease. 2. Probable non displaced pathologic and nonpathologic fractures of the sacrum bilaterally. 3. Pathologically enlarged 6 cm right inguinal lymph node. This is amenable to percutaneous ultrasound-guided biopsy if clinically indicated. 4. Bibasilar airspace disease concerning for multifocal pneumonia. There is a trace left-sided pleural effusion. 5. Cholelithiasis with mild gallbladder wall thickening. Correlation with ultrasound is recommended. 6. New compression fracture of the L5 vertebral body with approximately 50% height loss. There is an old L4 compression fracture. There is transitional vertebral anatomy as detailed above. 7. Diverticulosis without CT evidence for diverticulitis. 8. Borderline splenomegaly Aortic Atherosclerosis (ICD10-I70.0). Electronically Signed   By: [name] M.D.   On: 06/02/2020 20:43   DG Chest Port 1 View  Result Date: 06/02/2020 CLINICAL DATA:  Sepsis. EXAM: PORTABLE CHEST 1 VIEW COMPARISON:  09/13/2019 FINDINGS: Heart size is unremarkable. Aortic calcifications are noted. There is atelectasis at the lung bases. No definite large focal infiltrate. No pneumothorax or pleural effusion. Degenerative changes are noted of both glenohumeral joints. IMPRESSION: No active disease. Electronically Signed   By: [name] M.D.    On: 06/02/2020 16:36   DG Hip Unilat W or Wo Pelvis 2-3 Views Left  Result Date: 06/02/2020 CLINICAL DATA:  Pain EXAM: DG HIP (WITH OR WITHOUT PELVIS) 2-3V LEFT COMPARISON:  05/15/2020.  CT dated January 03, 2020 FINDINGS: There are degenerative changes of both hips, left worse than right. Evaluation for fractures is limited by patient body habitus. There are increasing lucencies in the proximal left femur when compared to prior studies. IMPRESSION: 1. Evaluation limited by patient body habitus. 2. No definite acute displaced fracture or dislocation. 3. Increasing lucencies in the proximal left femur raises concern for an underlying lesion. Follow-up with a contrast-enhanced MRI is recommended. Electronically Signed   By: [name] M.D.   On: 06/02/2020 16:35    Procedures Procedures (including critical care time)  Medications Ordered in ED Medications  fentaNYL (SUBLIMAZE) injection 25-50 mcg (has no administration in time range)  cefTRIAXone (ROCEPHIN) 2 g in sodium chloride 0.9 % 100 mL IVPB (has no administration in time range)  apixaban (ELIQUIS) tablet 2.5 mg (2.5 mg Oral Given 06/02/20 2207)  fentaNYL (DURAGESIC) 25 MCG/HR 1 patch (has no administration in time range)  lactated ringers infusion (has no administration in time range)  azithromycin (ZITHROMAX) 500 mg in sodium chloride 0.9 % 250 mL IVPB (has no administration in time range)  sodium chloride 0.9 % bolus 500 mL (0 mLs Intravenous Stopped 06/02/20 1807)  oxyCODONE-acetaminophen (PERCOCET/ROXICET) 5-325 MG per tablet 1 tablet (1 tablet  Oral Given 06/02/20 1831)  ciprofloxacin (CIPRO) IVPB 400 mg (0 mg Intravenous Stopped 06/02/20 2029)  fentaNYL (SUBLIMAZE) injection 50 mcg (50 mcg Intravenous Given 06/02/20 1928)    ED Course  I have reviewed the triage vital signs and the nursing notes.  Pertinent labs & imaging results that were available during my care of the patient were reviewed by me and considered in my  medical decision making (see chart for details).    MDM Rules/Calculators/A&P                         Patient here for evaluation of body aches, chills and fever that started today. On evaluation she is ill appearing and uncomfortable. She does have mild abdominal tenderness. Labs with acute on chronic kidney injury. She has a mild progressive anemia. She was treated with antibiotics and gentle fluid hydration for possible infection pending workup. CT abdomen pelvis was obtained given her mild abdominal tenderness. There is no evidence of acute intra-abdominal pathology but she does have CT findings concerning for developing pneumonia. Given her AKI and comorbidities hospitalist consulted for admission for ongoing treatment.  Final Clinical Impression(s) / ED Diagnoses Final diagnoses:  Community acquired pneumonia, unspecified laterality    Rx / DC Orders ED Discharge Orders    None       Quintella Reichert, MD 06/02/20 2214

## 2020-06-02 NOTE — ED Notes (Signed)
Pt placed on purewick catheter and instructed to hit call bell when she has a urine sample. Pt verbalized understanding.

## 2020-06-02 NOTE — ED Notes (Signed)
Date and time results received: 06/02/20 4:49 PM  (use smartphrase ".now" to insert current time)  Test: lactic acid Critical Value: 2.3  Name of Provider Notified:  Orders Received? Or Actions Taken?:

## 2020-06-02 NOTE — ED Triage Notes (Signed)
Pt arrived via EMS from [facility]. Pt was found with chills this morning and a "low fever" of 99.7 by her facility. Pt's temperature was 97.6 per EMS. Pt has generalized pain. Pt took 1 percocet before EMS arrived.

## 2020-06-02 NOTE — H&P (Signed)
History and Physical    Natasha Ellis [id number]:[id number] DOB: 12-Feb-1931 DOA: 06/02/2020  PCP: Lavone Orn, MD  Patient coming from: [facility]  I have personally briefly reviewed patient's old medical records in [place]  Chief Complaint: Chills  HPI: Natasha Ellis is a 86 y.o. female with medical history significant of Walednstrom's NHL, with known metastatic dz and bone marrow infiltration, just got cycle 2 of chemo on Friday.  Pt has been having pain at L hip and R arm since August.  Pt developed chills onset today.  Tm 99.7 at facility.  Felt well yesterday.  This morning had body aches, fatigue, increased pain in L hip, shaking chills.  Has mild cough.  No N/V/D.  Is COVID-19 vaccinated.   ED Course: COVID neg.  CT abd/pelvis shows multifocal PNA, also shows boney metastatic dz to pelvis and B non-displaced pathologic sacral fractures.  Given Cipro and 500cc bolus in ED.  BPs have started to trend down slightly.  Lactate 2.3.  Creat 2.0 up from 1.68 x2 days ago.  HGB 7.3 down from 8.0 x2 days ago.  Pt denies any known blood in stool, etc.   Review of Systems: As per HPI, otherwise all review of systems negative.  Past Medical History:  Diagnosis Date  . Arthritis   . Asthma   . Cancer ([place])    Lymphoma  . Cataract   . Chronic cough   . Diverticulosis of intestine    H/O  . DJD (degenerative joint disease) of lumbar spine   . IBS (irritable bowel syndrome)   . Left knee DJD   . Malignant lymphoplasmacytic lymphoma ([place]) 2004   Non-Hodgkin Lymphoma  . Right knee DJD   . Sleep apnea     Past Surgical History:  Procedure Laterality Date  . ABDOMINAL HYSTERECTOMY  1984   TAH  . EYE SURGERY Bilateral    with lens replacement  . EYE SURGERY Left    retnia  . KNEE ARTHROSCOPY Left   . NASAL SINUS SURGERY N/A 12/04/2015   Procedure: ENDOSCOPIC SINUS SURGERY;  Surgeon: Izora Gala, MD;  Location: [facility];  Service: ENT;  Laterality: N/A;  . TEE  WITHOUT CARDIOVERSION N/A 04/14/2016   Procedure: TRANSESOPHAGEAL ECHOCARDIOGRAM (TEE);  Surgeon: Lelon Perla, MD;  Location: [facility] ENDOSCOPY;  Service: Cardiovascular;  Laterality: N/A;  . TONSILLECTOMY AND ADENOIDECTOMY  childhood     reports that she quit smoking about 40 years ago. Her smoking use included cigarettes. She has a 25.00 pack-year smoking history. She has never used smokeless tobacco. She reports that she does not drink alcohol and does not use drugs.  Allergies  Allergen Reactions  . Codeine Other (See Comments)    Crazy-nightmares  . Amoxicillin Diarrhea    Has patient had a PCN reaction causing immediate rash, facial/tongue/throat swelling, SOB or lightheadedness with hypotension:  No Has patient had a PCN reaction causing severe rash involving mucus membranes or skin necrosis: No Has patient had a PCN reaction that required hospitalization No Has patient had a PCN reaction occurring within the last 10 years: Unknown--rx is diarrhea. If all of the above answers are "NO", then may proceed with Cephalosporin use.    . Cefdinir Cough  . Doxycycline Diarrhea  . Erythromycin Other (See Comments)    GI upset  . Lisinopril Cough  . Oxybutynin Other (See Comments)    Dry mouth  . Sulfa Antibiotics Other (See Comments)    GI upset  Family History  Problem Relation Age of Onset  . Stroke Father   . Heart failure Mother 100  . Breast cancer Sister      Prior to Admission medications   Medication Sig Start Date End Date Taking? Authorizing Provider  acetaminophen (TYLENOL) 500 MG tablet Take 1,000 mg by mouth every 6 (six) hours as needed for pain.     [provider]  Albuterol Sulfate 2.5 MG/0.5ML NEBU Inhale 0.5 mLs (2.5 mg total) into the lungs every 6 (six) hours as needed (sob/wheezing). Patient taking differently: Inhale 2.5 mg into the lungs 2 (two) times daily.  01/06/20   Aline August, MD  allopurinol (ZYLOPRIM) 100 MG tablet Take 1 tablet (100  mg total) by mouth daily. Patient taking differently: Take 100 mg by mouth 2 (two) times daily.  05/09/20   Ladell Pier, MD  apixaban (ELIQUIS) 2.5 MG TABS tablet Take 1 tablet (2.5 mg total) by mouth 2 (two) times daily. 01/18/20   Sueanne Margarita, MD  calcium carbonate (TUMS - DOSED IN MG ELEMENTAL CALCIUM) 500 MG chewable tablet Chew 2 tablets by mouth as needed for indigestion or heartburn.    [provider]  calcium gluconate 500 MG tablet Take 1 tablet by mouth daily.    [provider]  cholestyramine (QUESTRAN) 4 g packet Take 4 g by mouth daily as needed. Patient not taking: Reported on 05/31/2020    [provider]  dextromethorphan (DELSYM) 30 MG/5ML liquid Take by mouth 2 (two) times daily as needed for cough. Patient not taking: Reported on 05/31/2020    [provider]  diclofenac Sodium (VOLTAREN) 1 % GEL Apply 1 application topically at bedtime.    [provider]  diltiazem (CARDIZEM CD) 180 MG 24 hr capsule Take 1 capsule (180 mg total) by mouth daily. Please make yearly appt with Dr. Radford Pax in August for future refills. 1st attempt 01/18/20   Sueanne Margarita, MD  docusate sodium (COLACE) 100 MG capsule Take 100 mg by mouth daily.    [provider]  esomeprazole (NEXIUM) 40 MG capsule Take 40 mg by mouth daily. 05/02/20   [provider]  fentaNYL (DURAGESIC) 25 MCG/HR 1 patch every 3 (three) days. 05/25/20   [provider]  ferrous sulfate 325 (65 FE) MG tablet Take 325 mg by mouth daily.     [provider]  fluticasone (FLONASE) 50 MCG/ACT nasal spray Place 1 spray into both nostrils 2 (two) times daily as needed for allergies or rhinitis. Patient not taking: Reported on 05/31/2020    [provider]  fluticasone furoate-vilanterol (BREO ELLIPTA) 100-25 MCG/INH AEPB Inhale 1 puff into the lungs daily. 12/28/19   Rigoberto Noel, MD  furosemide (LASIX) 40 MG tablet Take 40 mg by mouth  daily.    [provider]  gabapentin (NEURONTIN) 100 MG capsule Take 100 mg by mouth 3 (three) times daily. 05/07/20   [provider]  guaiFENesin (MUCINEX) 600 MG 12 hr tablet Take 600 mg by mouth 2 (two) times daily.    [provider]  Ibuprofen 200 MG CAPS Take 200 mg by mouth 2 (two) times daily as needed.    [provider]  lactose free nutrition (BOOST) LIQD Take 237 mLs by mouth 3 (three) times daily between meals.    [provider]  naloxone [facility]) 0.4 MG/ML injection as needed. Patient not taking: Reported on 05/31/2020 05/27/20   [provider]  oxyCODONE-acetaminophen (PERCOCET) 7.5-325  MG tablet Take 1 tablet by mouth every 4 (four) hours as needed for severe pain.    [provider]  Polyethylene Glycol 3350 (MIRALAX PO) Take 17 g by mouth daily as needed.    [provider]  potassium chloride (K-DUR,KLOR-CON) 10 MEQ tablet Take 10 mEq by mouth daily.  10/11/17   [provider]  pramoxine-hydrocortisone (ANALPRAM HC) cream Apply topically daily as needed. Patient not taking: Reported on 05/31/2020    [provider]  Respiratory Therapy Supplies (FLUTTER) DEVI Use as directed. 09/06/18   Melvenia Needles, NP    Physical Exam: Vitals:   06/02/20 1623 06/02/20 1900 06/02/20 2054 06/02/20 2200  BP: 116/64 (!) 112/52  (!) 97/49  Pulse: 97 98 94 94  Resp: (!) 35 (!) 23 (!) 23 (!) 30  Temp:      TempSrc:      SpO2: 100% 92% 93% 91%  Weight:      Height:        Constitutional: NAD, calm, comfortable Eyes: PERRL, lids and conjunctivae normal ENMT: Mucous membranes are moist. Posterior pharynx clear of any exudate or lesions.Normal dentition.  Neck: normal, supple, no masses, no thyromegaly Respiratory: clear to auscultation bilaterally, no wheezing, no crackles. Normal respiratory effort. No accessory muscle use.  Cardiovascular: Regular rate and rhythm, no murmurs / rubs / gallops.  No extremity edema. 2+ pedal pulses. No carotid bruits.  Abdomen: no tenderness, no masses palpated. No hepatosplenomegaly. Bowel sounds positive.  Musculoskeletal: no clubbing / cyanosis. No joint deformity upper and lower extremities. Good ROM, no contractures. Normal muscle tone.  Skin: no rashes, lesions, ulcers. No induration Neurologic: CN 2-12 grossly intact. Sensation intact, DTR normal. Strength 5/5 in all 4.  Psychiatric: Normal judgment and insight. Alert and oriented x 3. Normal mood.    Labs on Admission: I have personally reviewed following labs and imaging studies  CBC: Recent Labs  Lab 05/30/20 0851 06/02/20 1523  WBC 6.2 9.1  NEUTROABS 4.6 8.1*  HGB 8.0* 7.3*  HCT 21.4* 21.5*  MCV 92.2 99.5  PLT 562* 798*   Basic Metabolic Panel: Recent Labs  Lab 05/30/20 0851 06/02/20 1523  NA 136 138  K 4.1 4.7  CL 101 102  CO2 25 22  GLUCOSE 117* 111*  BUN 29* 41*  CREATININE 1.68* 2.01*  CALCIUM 9.8 9.1   GFR: Estimated Creatinine Clearance: 25.5 mL/min (A) (by C-G formula based on SCr of 2.01 mg/dL (H)). Liver Function Tests: Recent Labs  Lab 05/30/20 0851 06/02/20 1523  AST 27 18  ALT 11 7  ALKPHOS 113 91  BILITOT 1.1 0.9  PROT 9.5* 8.5*  ALBUMIN 2.4* 2.5*   No results for input(s): LIPASE, AMYLASE in the last 168 hours. No results for input(s): AMMONIA in the last 168 hours. Coagulation Profile: Recent Labs  Lab 06/02/20 1523  INR 1.4*   Cardiac Enzymes: No results for input(s): CKTOTAL, CKMB, CKMBINDEX, TROPONINI in the last 168 hours. BNP (last 3 results) Recent Labs    09/13/19 0938  PROBNP 133.0*   HbA1C: No results for input(s): HGBA1C in the last 72 hours. CBG: No results for input(s): GLUCAP in the last 168 hours. Lipid Profile: No results for input(s): CHOL, HDL, LDLCALC, TRIG, CHOLHDL, LDLDIRECT in the last 72 hours. Thyroid Function Tests: No results for input(s): TSH, T4TOTAL, FREET4, T3FREE, THYROIDAB in the last 72  hours. Anemia Panel: No results for input(s): VITAMINB12, FOLATE, FERRITIN, TIBC, IRON, RETICCTPCT in the last 72 hours. Urine  analysis:    Component Value Date/Time   COLORURINE YELLOW 06/02/2020 1523   APPEARANCEUR HAZY (A) 06/02/2020 1523   LABSPEC 1.015 06/02/2020 1523   PHURINE 5.0 06/02/2020 1523   GLUCOSEU NEGATIVE 06/02/2020 1523   HGBUR NEGATIVE 06/02/2020 1523   BILIRUBINUR NEGATIVE 06/02/2020 1523   BILIRUBINUR neg 04/20/2013 [place] 06/02/2020 1523   PROTEINUR NEGATIVE 06/02/2020 1523   UROBILINOGEN 0.2 05/21/2015 1920   NITRITE NEGATIVE 06/02/2020 1523   LEUKOCYTESUR TRACE (A) 06/02/2020 1523    Radiological Exams on Admission: CT Abdomen Pelvis Wo Contrast  Result Date: 06/02/2020 CLINICAL DATA:  Abdominal pain and fever. EXAM: CT ABDOMEN AND PELVIS WITHOUT CONTRAST TECHNIQUE: Multidetector CT imaging of the abdomen and pelvis was performed following the standard protocol without IV contrast. COMPARISON:  CT dated January 03, 2020 FINDINGS: Lower chest: Multiple airspace opacities are noted at the lung bases bilaterally. There is associated bronchial wall thickening and mucous plugging. The heart size is normal. There are coronary artery calcifications.The heart size is normal. There is a trace left-sided pleural effusion. Hepatobiliary: The liver is normal. There is cholelithiasis with questionable mild gallbladder wall thickening.There is no biliary ductal dilation. Pancreas: Normal contours without ductal dilatation. No peripancreatic fluid collection. Spleen: The spleen is borderline enlarged measuring approximately 12 cm craniocaudad. Adrenals/Urinary Tract: --Adrenal glands: The right adrenal gland is unremarkable. There is a left adrenal myelolipoma that is stable. --Right kidney/ureter: Th there is a stable small exophytic proteinaceous or hemorrhagic cyst arising from the lower pole the right kidney. There is no right-sided hydronephrosis. --Left  kidney/ureter: There is no left-sided hydronephrosis. The left kidney is somewhat atrophic with areas of cortical thinning/scarring. --Urinary bladder: Unremarkable. Stomach/Bowel: --Stomach/Duodenum: No hiatal hernia or other gastric abnormality. Normal duodenal course and caliber. --Small bowel: Unremarkable. --Colon: There is scattered colonic diverticula without CT evidence for diverticulitis. There is a large amount of stool throughout the colon. --Appendix: Normal. Vascular/Lymphatic: Atherosclerotic calcification is present within the non-aneurysmal abdominal aorta, without hemodynamically significant stenosis. --No retroperitoneal lymphadenopathy. --No mesenteric lymphadenopathy. --there is a pathologically enlarged right inguinal lymph node measuring approximately 6 by 3.7 cm (axial series 2, image 71). Reproductive: Status post hysterectomy. No adnexal mass. Other: No ascites or free air. The abdominal wall is normal. Musculoskeletal. There are innumerable lytic lesions throughout the patient's pelvis. The largest is located in the left posterior iliac crest and measures approximately 3.7 cm (axial series 2, image 59). There are small lytic lesions located in the proximal left femur that correspond well with the recent x-ray. There is a stable rounded hyperdense lesion within the marrow cavity of the proximal left femur. This is favored to be benign given its stability. There is a probable nondisplaced zone 1 fracture through the right sacrum (axial series 2, image 54). There is a probable pathologic zone 1 fracture through the left inferior sacrum (axial series 2, image 66). There is transitional vertebral anatomy with 6 lumbar type vertebral bodies. There is an old compression fracture of the L4 vertebral body. There is a new compression fracture of the L5 vertebral body resulting in approximately 50% height loss. There are lytic lesions throughout the visualized ribs. IMPRESSION: 1. Innumerable lytic  lesions throughout the pelvic bones and ribs consistent with osseous metastatic disease. 2. Probable non displaced pathologic and nonpathologic fractures of the sacrum bilaterally. 3. Pathologically enlarged 6 cm right inguinal lymph node. This is amenable to percutaneous ultrasound-guided biopsy if clinically indicated. 4. Bibasilar airspace disease concerning for multifocal  pneumonia. There is a trace left-sided pleural effusion. 5. Cholelithiasis with mild gallbladder wall thickening. Correlation with ultrasound is recommended. 6. New compression fracture of the L5 vertebral body with approximately 50% height loss. There is an old L4 compression fracture. There is transitional vertebral anatomy as detailed above. 7. Diverticulosis without CT evidence for diverticulitis. 8. Borderline splenomegaly Aortic Atherosclerosis (ICD10-I70.0). Electronically Signed   By: [name] M.D.   On: 06/02/2020 20:43   DG Chest Port 1 View  Result Date: 06/02/2020 CLINICAL DATA:  Sepsis. EXAM: PORTABLE CHEST 1 VIEW COMPARISON:  09/13/2019 FINDINGS: Heart size is unremarkable. Aortic calcifications are noted. There is atelectasis at the lung bases. No definite large focal infiltrate. No pneumothorax or pleural effusion. Degenerative changes are noted of both glenohumeral joints. IMPRESSION: No active disease. Electronically Signed   By: [name] M.D.   On: 06/02/2020 16:36   DG Hip Unilat W or Wo Pelvis 2-3 Views Left  Result Date: 06/02/2020 CLINICAL DATA:  Pain EXAM: DG HIP (WITH OR WITHOUT PELVIS) 2-3V LEFT COMPARISON:  05/15/2020.  CT dated January 03, 2020 FINDINGS: There are degenerative changes of both hips, left worse than right. Evaluation for fractures is limited by patient body habitus. There are increasing lucencies in the proximal left femur when compared to prior studies. IMPRESSION: 1. Evaluation limited by patient body habitus. 2. No definite acute displaced fracture or dislocation. 3.  Increasing lucencies in the proximal left femur raises concern for an underlying lesion. Follow-up with a contrast-enhanced MRI is recommended. Electronically Signed   By: [name] M.D.   On: 06/02/2020 16:35    EKG: Independently reviewed.  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 Multifocal PNA - 1. PNA pathway 2. Tylenol for fever 3. Got cipro in ED 4. Switch to rocephin / azithro in AM due to AKI (note has tolerated these in recent past). 5. COVID neg 6. IVF: 500cc bolus and will start LR at 125 since mild AKI and BP starting to trend down here a bit. 7. Doesn't meet criteria for sepsis, yet, though certainly could go that way. 2. Pathologic fx of sacral vertebra - 1. Suspect this ongoing since aug and the cause of her "hip" pain. 2. Message sent to onc to let them know 3. Non-displaced 4. May want to consult ortho in AM to see if they have any recs. 3. NHL - 1. Got 2nd round of chemo on Friday 2. ? Cause of her worsened anemia today 3. IP consult to oncology put in epic and message sent to Dr. Benay Spice to update him on CT findings and pt admission. 4. Mild AKI on CKD 3- 1. IVF as above 2. Repeat labs in AM 5. Anemia - 1. No obvious bleeding source 2. Trend HGB 3. ? If worsening is due to recent chemo? 6. PAF - 1. Cont eliquis 2. Holding cardizem for the moment due to down trending BPs 3. Tele monitor  DVT prophylaxis: Eliquis Code Status: DNR - confirmed with pt and daughter Family Communication: Daughter at bedside Disposition Plan: home after PNA improved Consults called: None called, IP consult put into Epic for onc to see  tomorrow. Admission status:  Place in obs - likely to convert to [place], [place] [facility]  How to contact the [facility] Attending or Consulting provider [place] or covering provider during after hours [place], for this patient?  1. Check the care team in [facility] and look for a) attending/consulting [facility] provider listed and b) the [facility] team listed 2. Log into [email or web address]  Amion Physician Scheduling and messaging for groups and whole hospitals  On call and physician scheduling software for group practices, residents, hospitalists and other medical providers for call, clinic, rotation and shift schedules. On[employer] is a hospital-wide system for scheduling doctors and paging doctors on call. EasyPlot is for scientific plotting and data analysis.  [email or web address]  and use [facility]'s universal password to access. If you do not have the password, please contact the hospital operator.  3. Locate the [facility] provider you are looking for under [facility] and page to a number that you can be directly reached. 4. If you still have difficulty reaching the provider, please page the [facility] (Director on Call) for the Hospitalists listed on amion for assistance.  06/02/2020, 10:14 PM

## 2020-06-02 NOTE — Progress Notes (Signed)
Was called from [facility] re Ms Scharnhorst having shaking chills. However when we tried to call back could not get through. Finally contacted patient on her cell phone. She tells me she had shaking chills x2 this morning (it is now 1341 PM). She was given pain medicine and something to drink and she took a nap. Feels better now, no chills at present but had some congestion, says lasix yesterday helped. Denies cough, peurisy or phlegm at present.  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 Annalee Genta is not sure if blood cultures can be obtained there--she will find out. I ordered 2 blood clutures from separate sites and a urine culture, then start cipro 500 mg po BID. If cultures cannot be obtained there patient needs to be transported to the ED at [facility] for evaluation. Estill Bamberg will call me one way or the other in the next 2 hours (she has my direct page number) regarding disposition

## 2020-06-03 ENCOUNTER — Other Ambulatory Visit: Payer: Self-pay

## 2020-06-03 ENCOUNTER — Encounter (HOSPITAL_COMMUNITY): Payer: Self-pay | Admitting: Internal Medicine

## 2020-06-03 DIAGNOSIS — J440 Chronic obstructive pulmonary disease with acute lower respiratory infection: Secondary | ICD-10-CM | POA: Diagnosis not present

## 2020-06-03 DIAGNOSIS — C880 Waldenstrom macroglobulinemia: Secondary | ICD-10-CM | POA: Diagnosis not present

## 2020-06-03 DIAGNOSIS — J189 Pneumonia, unspecified organism: Principal | ICD-10-CM

## 2020-06-03 DIAGNOSIS — C8590 Non-Hodgkin lymphoma, unspecified, unspecified site: Secondary | ICD-10-CM | POA: Diagnosis not present

## 2020-06-03 DIAGNOSIS — J9601 Acute respiratory failure with hypoxia: Secondary | ICD-10-CM | POA: Diagnosis not present

## 2020-06-03 DIAGNOSIS — S3210XA Unspecified fracture of sacrum, initial encounter for closed fracture: Secondary | ICD-10-CM | POA: Diagnosis not present

## 2020-06-03 DIAGNOSIS — C7952 Secondary malignant neoplasm of bone marrow: Secondary | ICD-10-CM | POA: Diagnosis not present

## 2020-06-03 DIAGNOSIS — R52 Pain, unspecified: Secondary | ICD-10-CM | POA: Diagnosis not present

## 2020-06-03 DIAGNOSIS — Z66 Do not resuscitate: Secondary | ICD-10-CM | POA: Diagnosis not present

## 2020-06-03 DIAGNOSIS — N179 Acute kidney failure, unspecified: Secondary | ICD-10-CM | POA: Diagnosis not present

## 2020-06-03 DIAGNOSIS — Z20822 Contact with and (suspected) exposure to covid-19: Secondary | ICD-10-CM | POA: Diagnosis not present

## 2020-06-03 DIAGNOSIS — M8458XA Pathological fracture in neoplastic disease, other specified site, initial encounter for fracture: Secondary | ICD-10-CM | POA: Diagnosis not present

## 2020-06-03 DIAGNOSIS — Z6841 Body Mass Index (BMI) 40.0 and over, adult: Secondary | ICD-10-CM | POA: Diagnosis not present

## 2020-06-03 DIAGNOSIS — C7951 Secondary malignant neoplasm of bone: Secondary | ICD-10-CM | POA: Diagnosis not present

## 2020-06-03 LAB — CBC
HCT: 22.4 % — ABNORMAL LOW (ref 36.0–46.0)
Hemoglobin: 7.7 g/dL — ABNORMAL LOW (ref 12.0–15.0)
MCH: 34.7 pg — ABNORMAL HIGH (ref 26.0–34.0)
MCHC: 34.4 g/dL (ref 30.0–36.0)
MCV: 100.9 fL — ABNORMAL HIGH (ref 80.0–100.0)
Platelets: 612 10*3/uL — ABNORMAL HIGH (ref 150–400)
RBC: 2.22 MIL/uL — ABNORMAL LOW (ref 3.87–5.11)
RDW: 24.3 % — ABNORMAL HIGH (ref 11.5–15.5)
WBC: 8 10*3/uL (ref 4.0–10.5)
nRBC: 0 % (ref 0.0–0.2)

## 2020-06-03 LAB — COMPREHENSIVE METABOLIC PANEL
ALT: 10 U/L (ref 0–44)
AST: 21 U/L (ref 15–41)
Albumin: 2.3 g/dL — ABNORMAL LOW (ref 3.5–5.0)
Alkaline Phosphatase: 90 U/L (ref 38–126)
Anion gap: 12 (ref 5–15)
BUN: 43 mg/dL — ABNORMAL HIGH (ref 8–23)
CO2: 21 mmol/L — ABNORMAL LOW (ref 22–32)
Calcium: 8.7 mg/dL — ABNORMAL LOW (ref 8.9–10.3)
Chloride: 103 mmol/L (ref 98–111)
Creatinine, Ser: 2.01 mg/dL — ABNORMAL HIGH (ref 0.44–1.00)
GFR, Estimated: 24 mL/min — ABNORMAL LOW (ref >60)
Glucose, Bld: 96 mg/dL (ref 70–99)
Potassium: 5 mmol/L (ref 3.5–5.1)
Sodium: 136 mmol/L (ref 135–145)
Total Bilirubin: 1.3 mg/dL — ABNORMAL HIGH (ref 0.3–1.2)
Total Protein: 7.9 g/dL (ref 6.5–8.1)

## 2020-06-03 LAB — URINE CULTURE

## 2020-06-03 LAB — MRSA PCR SCREENING: MRSA by PCR: NEGATIVE

## 2020-06-03 LAB — HIV ANTIBODY (ROUTINE TESTING W REFLEX): HIV Screen 4th Generation wRfx: NONREACTIVE

## 2020-06-03 LAB — SODIUM, URINE, RANDOM: Sodium, Ur: 19 mmol/L

## 2020-06-03 LAB — ABO/RH: ABO/RH(D): AB POS

## 2020-06-03 MED ORDER — LIP MEDEX EX OINT
TOPICAL_OINTMENT | CUTANEOUS | Status: DC | PRN
  Filled 2020-06-03: qty 7

## 2020-06-03 MED ORDER — DILTIAZEM HCL-DEXTROSE 125-5 MG/125ML-% IV SOLN (PREMIX)
5.0000 mg/h | INTRAVENOUS | Status: DC
  Administered 2020-06-03: 5 mg/h via INTRAVENOUS
  Filled 2020-06-03 (×2): qty 125

## 2020-06-03 MED ORDER — MORPHINE SULFATE (PF) 2 MG/ML IV SOLN
2.0000 mg | INTRAVENOUS | Status: DC | PRN
  Administered 2020-06-03: 4 mg via INTRAVENOUS
  Filled 2020-06-03: qty 2

## 2020-06-03 MED ORDER — DIGOXIN 0.25 MG/ML IJ SOLN
0.2500 mg | Freq: Every day | INTRAMUSCULAR | Status: AC
  Administered 2020-06-03: 0.25 mg via INTRAVENOUS
  Filled 2020-06-03: qty 2

## 2020-06-03 MED ORDER — HYDROMORPHONE HCL 1 MG/ML IJ SOLN
0.2500 mg | INTRAMUSCULAR | Status: DC | PRN
  Administered 2020-06-03 – 2020-06-05 (×6): 0.25 mg via INTRAVENOUS
  Filled 2020-06-03 (×4): qty 0.5
  Filled 2020-06-03: qty 1
  Filled 2020-06-03: qty 0.5

## 2020-06-03 MED ORDER — DILTIAZEM HCL 25 MG/5ML IV SOLN
5.0000 mg | Freq: Once | INTRAVENOUS | Status: AC
  Administered 2020-06-03: 5 mg via INTRAVENOUS
  Filled 2020-06-03: qty 5

## 2020-06-03 MED ORDER — CHLORHEXIDINE GLUCONATE CLOTH 2 % EX PADS
6.0000 | MEDICATED_PAD | Freq: Every day | CUTANEOUS | Status: DC
  Administered 2020-06-03 – 2020-06-12 (×6): 6 via TOPICAL

## 2020-06-03 MED ORDER — OXYCODONE HCL 5 MG PO TABS
5.0000 mg | ORAL_TABLET | ORAL | Status: DC | PRN
  Administered 2020-06-03: 10 mg via ORAL
  Administered 2020-06-04 – 2020-06-05 (×5): 5 mg via ORAL
  Filled 2020-06-03: qty 2
  Filled 2020-06-03 (×3): qty 1
  Filled 2020-06-03: qty 2
  Filled 2020-06-03: qty 1
  Filled 2020-06-03: qty 2

## 2020-06-03 MED ORDER — ALBUMIN HUMAN 25 % IV SOLN
25.0000 g | Freq: Four times a day (QID) | INTRAVENOUS | Status: DC
  Administered 2020-06-03 – 2020-06-04 (×3): 25 g via INTRAVENOUS
  Filled 2020-06-03 (×3): qty 100

## 2020-06-03 NOTE — Progress Notes (Addendum)
HEMATOLOGY-ONCOLOGY PROGRESS NOTE  SUBJECTIVE: Ms. Mcafee presented to the emergency room with chills.  Had a temperature at the facility of 99.7.  In addition to chills, she also had body aches, fatigue, increased left hip pain, and mild cough.  Work-up in the emergency room included a CT of the abdomen/pelvis which showed concerns for multifocal pneumonia, pathologically enlarged 6 cm right lower lymph node, innumerable lytic lesions in the pelvis and ribs, probable nondisplaced pathologic and not pathologic fractures of the sacrum bilaterally, new compression fracture of L5.  The patient reports pain "all over" today. Pain is the worst in her lower back and left hip.  She currently has IV fentanyl ordered which has been ineffective.  She has no other complaints today.  Oncology History  Waldenstrom's macroglobulinemia ([place])  04/19/2014 Initial Diagnosis   Waldenstrom's macroglobulinemia ([place])   11/27/2019 - 12/27/2019 Chemotherapy   The patient had palonosetron (ALOXI) injection 0.25 mg, 0.25 mg, Intravenous,  Once, 2 of 4 cycles Administration: 0.25 mg (11/27/2019), 0.25 mg (12/26/2019) bendamustine (BENDEKA) 175 mg in sodium chloride 0.9 % 50 mL (3.0702 mg/mL) chemo infusion, 70 mg/m2 = 175 mg (100 % of original dose 70 mg/m2), Intravenous,  Once, 2 of 4 cycles Dose modification: 70 mg/m2 (original dose 70 mg/m2, Cycle 1, Reason: Provider Judgment) Administration: 175 mg (11/27/2019), 175 mg (11/28/2019), 175 mg (12/26/2019), 175 mg (12/27/2019) riTUXimab-pvvr (RUXIENCE) 900 mg in sodium chloride 0.9 % 250 mL (2.6471 mg/mL) infusion, 375 mg/m2 = 900 mg, Intravenous,  Once, 2 of 4 cycles Administration: 900 mg (11/27/2019), 900 mg (12/26/2019)  for chemotherapy treatment.    05/09/2020 -  Chemotherapy   The patient had palonosetron (ALOXI) injection 0.25 mg, 0.25 mg, Intravenous,  Once, 2 of 4 cycles Administration: 0.25 mg (05/09/2020), 0.25 mg (05/31/2020) pentostatin (NIPENT) 4.8 mg in sodium  chloride 0.9 % 50 mL chemo infusion, 2 mg/m2 = 4.8 mg (100 % of original dose 2 mg/m2), Intravenous,  Once, 2 of 4 cycles Dose modification: 2 mg/m2 (original dose 2 mg/m2, Cycle 1, Reason: Provider Judgment) Administration: 4.8 mg (05/09/2020), 4.8 mg (05/31/2020) cyclophosphamide (CYTOXAN) 920 mg in sodium chloride 0.9 % 250 mL chemo infusion, 400 mg/m2 = 920 mg (100 % of original dose 400 mg/m2), Intravenous,  Once, 2 of 4 cycles Dose modification: 400 mg/m2 (original dose 400 mg/m2, Cycle 1, Reason: Provider Judgment) Administration: 920 mg (05/09/2020), 920 mg (05/31/2020) riTUXimab-pvvr (RUXIENCE) 900 mg in sodium chloride 0.9 % 250 mL (2.6471 mg/mL) infusion, 375 mg/m2 = 900 mg, Intravenous,  Once, 2 of 4 cycles Administration: 900 mg (05/09/2020), 900 mg (05/31/2020)  for chemotherapy treatment.     PHYSICAL EXAMINATION:  Vitals:   06/03/20 0958 06/03/20 1448  BP: (!) 93/55 (!) 119/56  Pulse: 92 93  Resp: 18 16  Temp: 98.3 F (36.8 C) 97.9 F (36.6 C)  SpO2: 96% 96%   Filed Weights   06/02/20 1511 06/03/20 0958  Weight: 115.7 kg 121.5 kg    Intake/Output from previous day: No intake/output data recorded.  GENERAL: Awake and alert, no distress OROPHARYNX: No thrush or mucositis LYMPH: Palpable right inguinal lymph node measuring approximately 6 cm LUNGS: Diminished breath sounds bilaterally HEART: regular rate & rhythm  ABDOMEN:abdomen soft, non-tender and normal bowel sounds NEURO: alert & oriented x 3 with fluent speech, no focal motor/sensory deficits  LABORATORY DATA:  I have reviewed the data as listed CMP Latest Ref Rng & Units 06/03/2020 06/02/2020 05/30/2020  Glucose 70 - 99 mg/dL 96 111(H) 117(H)  BUN 8 - 23 mg/dL 43(H) 41(H) 29(H)  Creatinine 0.44 - 1.00 mg/dL 2.01(H) 2.01(H) 1.68(H)  Sodium 135 - 145 mmol/L 136 138 136  Potassium 3.5 - 5.1 mmol/L 5.0 4.7 4.1  Chloride 98 - 111 mmol/L 103 102 101  CO2 22 - 32 mmol/L 21(L) 22 25  Calcium 8.9 - 10.3 mg/dL  8.7(L) 9.1 9.8  Total Protein 6.5 - 8.1 g/dL 7.9 8.5(H) 9.5(H)  Total Bilirubin 0.3 - 1.2 mg/dL 1.3(H) 0.9 1.1  Alkaline Phos 38 - 126 U/L 90 91 113  AST 15 - 41 U/L 21 18 27   ALT 0 - 44 U/L 10 7 11     Lab Results  Component Value Date   WBC 8.0 06/03/2020   HGB 7.7 (L) 06/03/2020   HCT 22.4 (L) 06/03/2020   MCV 100.9 (H) 06/03/2020   PLT 612 (H) 06/03/2020   NEUTROABS 8.1 (H) 06/02/2020    CT Abdomen Pelvis Wo Contrast  Result Date: 06/02/2020 CLINICAL DATA:  Abdominal pain and fever. EXAM: CT ABDOMEN AND PELVIS WITHOUT CONTRAST TECHNIQUE: Multidetector CT imaging of the abdomen and pelvis was performed following the standard protocol without IV contrast. COMPARISON:  CT dated January 03, 2020 FINDINGS: Lower chest: Multiple airspace opacities are noted at the lung bases bilaterally. There is associated bronchial wall thickening and mucous plugging. The heart size is normal. There are coronary artery calcifications.The heart size is normal. There is a trace left-sided pleural effusion. Hepatobiliary: The liver is normal. There is cholelithiasis with questionable mild gallbladder wall thickening.There is no biliary ductal dilation. Pancreas: Normal contours without ductal dilatation. No peripancreatic fluid collection. Spleen: The spleen is borderline enlarged measuring approximately 12 cm craniocaudad. Adrenals/Urinary Tract: --Adrenal glands: The right adrenal gland is unremarkable. There is a left adrenal myelolipoma that is stable. --Right kidney/ureter: Th there is a stable small exophytic proteinaceous or hemorrhagic cyst arising from the lower pole the right kidney. There is no right-sided hydronephrosis. --Left kidney/ureter: There is no left-sided hydronephrosis. The left kidney is somewhat atrophic with areas of cortical thinning/scarring. --Urinary bladder: Unremarkable. Stomach/Bowel: --Stomach/Duodenum: No hiatal hernia or other gastric abnormality. Normal duodenal course and caliber.  --Small bowel: Unremarkable. --Colon: There is scattered colonic diverticula without CT evidence for diverticulitis. There is a large amount of stool throughout the colon. --Appendix: Normal. Vascular/Lymphatic: Atherosclerotic calcification is present within the non-aneurysmal abdominal aorta, without hemodynamically significant stenosis. --No retroperitoneal lymphadenopathy. --No mesenteric lymphadenopathy. --there is a pathologically enlarged right inguinal lymph node measuring approximately 6 by 3.7 cm (axial series 2, image 71). Reproductive: Status post hysterectomy. No adnexal mass. Other: No ascites or free air. The abdominal wall is normal. Musculoskeletal. There are innumerable lytic lesions throughout the patient's pelvis. The largest is located in the left posterior iliac crest and measures approximately 3.7 cm (axial series 2, image 59). There are small lytic lesions located in the proximal left femur that correspond well with the recent x-ray. There is a stable rounded hyperdense lesion within the marrow cavity of the proximal left femur. This is favored to be benign given its stability. There is a probable nondisplaced zone 1 fracture through the right sacrum (axial series 2, image 54). There is a probable pathologic zone 1 fracture through the left inferior sacrum (axial series 2, image 66). There is transitional vertebral anatomy with 6 lumbar type vertebral bodies. There is an old compression fracture of the L4 vertebral body. There is a new compression fracture of the L5 vertebral body resulting in approximately 50% height  loss. There are lytic lesions throughout the visualized ribs. IMPRESSION: 1. Innumerable lytic lesions throughout the pelvic bones and ribs consistent with osseous metastatic disease. 2. Probable non displaced pathologic and nonpathologic fractures of the sacrum bilaterally. 3. Pathologically enlarged 6 cm right inguinal lymph node. This is amenable to percutaneous  ultrasound-guided biopsy if clinically indicated. 4. Bibasilar airspace disease concerning for multifocal pneumonia. There is a trace left-sided pleural effusion. 5. Cholelithiasis with mild gallbladder wall thickening. Correlation with ultrasound is recommended. 6. New compression fracture of the L5 vertebral body with approximately 50% height loss. There is an old L4 compression fracture. There is transitional vertebral anatomy as detailed above. 7. Diverticulosis without CT evidence for diverticulitis. 8. Borderline splenomegaly Aortic Atherosclerosis (ICD10-I70.0). Electronically Signed   By: [name] M.D.   On: 06/02/2020 20:43   DG Chest Port 1 View  Result Date: 06/02/2020 CLINICAL DATA:  Sepsis. EXAM: PORTABLE CHEST 1 VIEW COMPARISON:  09/13/2019 FINDINGS: Heart size is unremarkable. Aortic calcifications are noted. There is atelectasis at the lung bases. No definite large focal infiltrate. No pneumothorax or pleural effusion. Degenerative changes are noted of both glenohumeral joints. IMPRESSION: No active disease. Electronically Signed   By: [name] M.D.   On: 06/02/2020 16:36   DG Hip Unilat W or W/O Pelvis 1 View Left  Result Date: 05/17/2020 CLINICAL DATA:  History of lymphoma. Hypermetabolic lesion in the left iliac crest on prior PET CT scan. EXAM: DG HIP (WITH OR WITHOUT PELVIS) 1V*L* COMPARISON:  PET CT scan 03/27/2019 and CT abdomen and pelvis 01/03/2020. FINDINGS: This examination is limited as the patient was unable to stand or lie on the table for imaging. There is subtle lucency in the anterior right ilium compatible with the lesion seen on the prior studies. No other abnormality is identified. IMPRESSION: Markedly limited study demonstrating a lucent lesion in the right ilium worrisome for a lymphomatous deposit as seen on the prior studies. Electronically Signed   By: Inge Rise M.D.   On: 05/17/2020 09:17   DG Hip Unilat W or Wo Pelvis 2-3 Views  Left  Result Date: 06/02/2020 CLINICAL DATA:  Pain EXAM: DG HIP (WITH OR WITHOUT PELVIS) 2-3V LEFT COMPARISON:  05/15/2020.  CT dated January 03, 2020 FINDINGS: There are degenerative changes of both hips, left worse than right. Evaluation for fractures is limited by patient body habitus. There are increasing lucencies in the proximal left femur when compared to prior studies. IMPRESSION: 1. Evaluation limited by patient body habitus. 2. No definite acute displaced fracture or dislocation. 3. Increasing lucencies in the proximal left femur raises concern for an underlying lesion. Follow-up with a contrast-enhanced MRI is recommended. Electronically Signed   By: [name] M.D.   On: 06/02/2020 16:35    ASSESSMENT AND PLAN: 1. 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 Treated with multiple systemic therapies including pentostatin/Cytoxan/rituximab in 2010.   Cycle 1 bendamustine/Rituxan 05/31/2014.  Cycle 2 bendamustine/Rituxan 07/02/2014  Cycle 3 bendamustine/rituximab 07/30/2014  Cycle 4 bendamustine/rituximab 09/10/2014  Cycle 5 bendamustine/rituximab 10/22/2014  Cycle 6 bendamustine/rituximab 12/10/2014  PET scan 03/27/2019-diffuse small hypermetabolic lymph nodes in the neck, chest, abdomen, and pelvis, heterogenous hypermetabolism in the bones  CT chest 10/02/2019-bronchiectasis at the lung bases, 13 mm nodular area at the medial left hemidiaphragm, mildly enlarged mediastinal nodes-decreased from June 2020, slightly enlarged  left axillary node-9 mm  Rising [name] level March and April 2021  Cycle 1 bendamustine/rituximab 11/27/2019  Cycle 2 bendamustine/Rituxan 12/26/2019  01/22/2020 [name] 4225 (stable)   Cycle 1 cyclophosphamide, pentostatin, rituximab 05/09/2020  Cycle 2 cyclophosphamide, pentostatin,  rituximab 05/31/2020 2. Left knee arthritis. Followed by Dr. Wynelle Link. 3. Sleep apnea. 4. Anemia. microcytic with peripheral blood smear findings consistent with iron deficiency 07/09/2015, serum iron studies consistent with "anemia of chronic disease ", stool Hemoccult cards negative 07/11/2015 5. Question left parotitis 12/12/2013 presenting with left facial and parotid edema. 6. Admission with pneumonia February 2016 7. History of Neutropenia secondary to chemotherapy, now with moderate neutropenia 8. Left lung pneumonia 04/02/2015, sputum culture positive for Moraxella 06/19/2015 9. Low IgG and IgA 10. CT chest 05/13/2015 with bilateral lower lung consolidation 11. CT chest, abdomen, and pelvis 01/08/2016-new peribronchial thickening and peripheral tree in bud pattern at the right lower lobe, no lymphadenopathy 12. Sinus mucosal thickening/opacification on CT 08/16/2015-status post sinus surgery 12/05/2015 13. Sputum culture positive for pseudomonas aeruginosa on 01/10/2016-treated with 10 days of ciprofloxacin 14. 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 Admission with pneumonia 08/26/2016 16. Left lung nodule increased in size on a CT 01/06/2019, not hypermetabolic on the PET scan 9/44/9675, followed by Dr. Elsworth Soho 17.Admission with rectal bleeding 01/02/2020-diverticular? 18.  Pain at the right arm and left iliac-likely related to lymphoma, plain x-ray 05/07/2020 at [facility] revealed destructive changes in the right humerus and radius, x-ray of the pelvis 05/15/2020-subtle lucent lesion in the right ilium 19.  Hospital admission 06/02/2020 -pneumonia, AKI, anemia  Ms. Trotti is now admitted to the hospital for uncontrolled pain and possible multifocal pneumonia.  Currently receiving IV antibiotics and currently afebrile.  Blood cultures negative to date and urine culture pending.  Findings noted in the lungs on the CT scan could be due to multifocal pneumonia versus  bronchiectasis.  Continue antibiotics for now.  The patient has persistent anemia which is consistent with her baseline.  CT abdomen/pelvis on admission showed innumerable lytic lesions in the pelvic bones and ribs and a probable nondisplaced pathologic and not pathologic fractures of the sacrum bilaterally.  She was noted to have a 6 cm right inguinal lymph node.  She is currently on a fentanyl patch and receiving IV fentanyl which has not been fully effective.  Recommendations: 1.  Continue fentanyl patch at the current dose. 2.  DC IV fentanyl because it has not been effective.  Will try morphine 2 to 4 mg IV every 4 hours as needed for severe pain and oxycodone 5 to 10 mg every 4 hours as needed for moderate pain. 3.  If unable to get pain under better control, will consider radiation oncology consult for palliative radiation to her left iliac bone/sacrum.   LOS: 0 days   Mikey Bussing, DNP, AGPCNP-BC, AOCNP 06/03/20 Ms. Stalnaker was interviewed and examined.  Her daughter was at the bedside.  She is well-known to me with a history of WaldenStrom's macroglobulinemia.  She completed cycle two of salvage systemic therapy on 05/31/2020.  She was admitted with a fever and chills.  She has pain in the lower back and pelvis.  I reviewed the CT images.  There are multiple lytic bone lesions, likely secondary to the lymphoma.  The acute increase in pain may be related to fractures at the left ilium and sacrum.  We adjusted the narcotic pain regimen.  We will refer her for palliative radiation if the pain is not  well controlled with analgesics and further treatment of the WaldenStrom's.

## 2020-06-03 NOTE — ED Notes (Signed)
Resp 20

## 2020-06-03 NOTE — ED Notes (Signed)
Pt placed on hospital bed for comfort and decrease skin breakdown.

## 2020-06-03 NOTE — Progress Notes (Signed)
PROGRESS NOTE    Natasha Ellis  KWI:[id number] DOB: 08-23-1930 DOA: 06/02/2020 PCP: Lavone Orn, MD  Outpatient Specialists:   Brief Narrative:  Patient is an 86 year old Caucasian female, morbidly obese, with past medical history significant for white restaurants and a chill, with known metastatic disease and bone marrow infiltration.  Patient has chronic left hip and right arm pain.  Patient is currently on chemotherapy.  Patient was admitted with chills and cough.  CT scan of the abdomen and pelvis done revealed multifocal pneumonia.  It also revealed bony metastasis to the pelvis, and nondisplaced pathological sacral fractures.  Patient is currently on azithromycin and Rocephin.  Oncology team has been consulted to assist with patient's management.  06/03/2020: Patient seen alongside patient's daughter.  Patient continues to report pain all over.  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 Multifocal PNA - 1. Continue IV azithromycin and ceftriaxone.   2. Tylenol for fever 3. Got cipro in ED 4. COVID neg 5. Follow cultures.. 2. Pathologic fracture of sacral vertebra - 1. Optimize pain control. 2. Oncology team is managing.   3. NHL - 1. Got 2nd round of chemo on Friday 2. Oncology is following patient. 4. Mild AKI on CKD 3- 1. Suspect prerenal/ineffective circulatory arterial volume. 2. Hold IV fluids.  Start IV albumin.  Albumin is 2.3. 3. Monitor respiratory status closely.  Low threshold to use IV Lasix if patient develops respiratory distress from volume overload 5. Anemia - 1. No obvious bleeding source 2. Likely multifactorial. 6. PAF  - 1. Cont eliquis 2. Holding cardizem for the moment due to down trending BPs 3. Tele monitor  DVT prophylaxis: Eliquis. Code Status: DO NOT RESUSCITATE Family Communication: Daughter Disposition Plan: This will depend on hospital course   Consultants:   Oncology  Procedures:   None  Antimicrobials:   IV azithromycin.  IV Rocephin   Subjective: Pain all over.  Objective: Vitals:   06/03/20 0723 06/03/20 0900 06/03/20 0958 06/03/20 1448  BP: (!) 101/53 (!) 103/56 (!) 93/55 (!) 119/56  Pulse: 93 93 92 93  Resp: 18 (!) 26 18 16   Temp:   98.3 F (36.8 C) 97.9 F (36.6 C)  TempSrc:   Oral Oral  SpO2: 100% 97% 96% 96%  Weight:   121.5 kg   Height:       No intake or output data in the 24 hours ending 06/03/20 1732 Filed Weights   06/02/20 1511 06/03/20 0958  Weight: 115.7 kg 121.5 kg    Examination:  General exam: Patient is morbidly obese.  Patient seems to be in mild painful distress.  Patient is pale.  No jaundice.  Respiratory system: Inspiratory and expiratory wheeze, with added breath sounds.  Decreased air entry. Cardiovascular system: S1 & S2 heard. Gastrointestinal system: Abdomen is obese, soft and nontender.  Organs are difficult to assess.   Central nervous system: Alert and oriented.  Patient moves all extremities. Extremities: Bilateral leg edema (1+)  Data Reviewed: I have personally reviewed following labs and imaging studies  CBC: Recent Labs  Lab 05/30/20 0851 06/02/20 1523 06/03/20 1014  WBC 6.2 9.1 8.0  NEUTROABS 4.6 8.1*  --  HGB 8.0* 7.3* 7.7*  HCT 21.4* 21.5* 22.4*  MCV 92.2 99.5 100.9*  PLT 562* 534* 440*   Basic Metabolic Panel: Recent Labs  Lab 05/30/20 0851 06/02/20 1523 06/03/20 1014  NA 136 138 136  K 4.1 4.7 5.0  CL 101 102 103  CO2 25 22 21*  GLUCOSE 117* 111* 96  BUN 29* 41* 43*  CREATININE 1.68* 2.01* 2.01*  CALCIUM 9.8 9.1 8.7*   GFR: Estimated Creatinine Clearance: 26.2 mL/min (A) (by C-G formula  based on SCr of 2.01 mg/dL (H)). Liver Function Tests: Recent Labs  Lab 05/30/20 0851 06/02/20 1523 06/03/20 1014  AST 27 18 21   ALT 11 7 10   ALKPHOS 113 91 90  BILITOT 1.1 0.9 1.3*  PROT 9.5* 8.5* 7.9  ALBUMIN 2.4* 2.5* 2.3*   No results for input(s): LIPASE, AMYLASE in the last 168 hours. No results for input(s): AMMONIA in the last 168 hours. Coagulation Profile: Recent Labs  Lab 06/02/20 1523  INR 1.4*   Cardiac Enzymes: No results for input(s): CKTOTAL, CKMB, CKMBINDEX, TROPONINI in the last 168 hours. BNP (last 3 results) Recent Labs    09/13/19 0938  PROBNP 133.0*   HbA1C: No results for input(s): HGBA1C in the last 72 hours. CBG: No results for input(s): GLUCAP in the last 168 hours. Lipid Profile: No results for input(s): CHOL, HDL, LDLCALC, TRIG, CHOLHDL, LDLDIRECT in the last 72 hours. Thyroid Function Tests: No results for input(s): TSH, T4TOTAL, FREET4, T3FREE, THYROIDAB in the last 72 hours. Anemia Panel: No results for input(s): VITAMINB12, FOLATE, FERRITIN, TIBC, IRON, RETICCTPCT in the last 72 hours. Urine analysis:    Component Value Date/Time   COLORURINE YELLOW 06/02/2020 1523   APPEARANCEUR HAZY (A) 06/02/2020 1523   LABSPEC 1.015 06/02/2020 1523   PHURINE 5.0 06/02/2020 1523   GLUCOSEU NEGATIVE 06/02/2020 1523   HGBUR NEGATIVE 06/02/2020 1523   BILIRUBINUR NEGATIVE 06/02/2020 1523   BILIRUBINUR neg 04/20/2013 1728   KETONESUR NEGATIVE 06/02/2020 1523   PROTEINUR NEGATIVE 06/02/2020 1523   UROBILINOGEN 0.2 05/21/2015 1920   NITRITE NEGATIVE 06/02/2020 1523   LEUKOCYTESUR TRACE (A) 06/02/2020 1523   Sepsis Labs: @LABRCNTIP (procalcitonin:4,lacticidven:4)  ) Recent Results (from the past 240 hour(s))  Respiratory Panel by RT PCR (Flu A&B, Covid) - Nasopharyngeal Swab     Status: None   Collection Time: 06/02/20  3:24 PM   Specimen: Nasopharyngeal Swab  Result Value Ref Range Status   SARS Coronavirus 2 by RT PCR NEGATIVE NEGATIVE  Final    Comment: (NOTE) SARS-CoV-2 target nucleic acids are NOT DETECTED.  The SARS-CoV-2 RNA is generally detectable in upper respiratoy specimens during the acute phase of infection. The lowest concentration of SARS-CoV-2 viral copies this assay can detect is 131 copies/mL. A negative result does not preclude SARS-Cov-2 infection and should not be used as the sole basis for treatment or other patient management decisions. A negative result may occur with  improper specimen collection/handling, submission of specimen other than nasopharyngeal swab, presence of viral mutation(s) within the areas targeted by this assay, and inadequate number of viral copies (<131 copies/mL). A negative result must be combined with clinical observations, patient history, and epidemiological information. The expected result is Negative.  Fact Sheet for Patients:  PinkCheek.be  Fact Sheet for Healthcare Providers:  GravelBags.it  This test is no t yet approved or cleared by the [place] FDA and  has been authorized for detection and/or diagnosis of SARS-CoV-2 by FDA under an Emergency Use Authorization (EUA). This  EUA will remain  in effect (meaning this test can be used) for the duration of the COVID-19 declaration under Section 564(b)(1) of the Act, 21 U.S.C. section 360bbb-3(b)(1), unless the authorization is terminated or revoked sooner.     Influenza A by PCR NEGATIVE NEGATIVE Final   Influenza B by PCR NEGATIVE NEGATIVE Final    Comment: (NOTE) The Xpert Xpress SARS-CoV-2/FLU/RSV assay is intended as an aid in  the diagnosis of influenza from Nasopharyngeal swab specimens and  should not be used as a sole basis for treatment. Nasal washings and  aspirates are unacceptable for Xpert Xpress SARS-CoV-2/FLU/RSV  testing.  Fact Sheet for Patients: PinkCheek.be  Fact Sheet for Healthcare  Providers: GravelBags.it  This test is not yet approved or cleared by the [place] FDA and  has been authorized for detection and/or diagnosis of SARS-CoV-2 by  FDA under an Emergency Use Authorization (EUA). This EUA will remain  in effect (meaning this test can be used) for the duration of the  Covid-19 declaration under Section 564(b)(1) of the Act, 21  U.S.C. section 360bbb-3(b)(1), unless the authorization is  terminated or revoked. Performed at [facility], [place] [address], [place], [place] [address]   Blood Culture (routine x 2)     Status: None (Preliminary result)   Collection Time: 06/02/20  3:28 PM   Specimen: BLOOD  Result Value Ref Range Status   Specimen Description   Final    BLOOD LEFT ANTECUBITAL Performed at [facility] [address], [place], [place] [address]    Special Requests   Final    BOTTLES DRAWN AEROBIC AND ANAEROBIC Blood Culture adequate volume Performed at [facility] [address] [place], [place] [address]    Culture   Final    NO GROWTH < 24 HOURS Performed at [facility] [address], [place], [place] [address]    Report Status PENDING  Incomplete         Radiology Studies: CT Abdomen Pelvis Wo Contrast  Result Date: 06/02/2020 CLINICAL DATA:  Abdominal pain and fever. EXAM: CT ABDOMEN AND PELVIS WITHOUT CONTRAST TECHNIQUE: Multidetector CT imaging of the abdomen and pelvis was performed following the standard protocol without IV contrast. COMPARISON:  CT dated January 03, 2020 FINDINGS: Lower chest: Multiple airspace opacities are noted at the lung bases bilaterally. There is associated bronchial wall thickening and mucous plugging. The heart size is normal. There are coronary artery calcifications.The heart size is normal. There is a trace left-sided pleural effusion. Hepatobiliary: The liver is normal. There is cholelithiasis with  questionable mild gallbladder wall thickening.There is no biliary ductal dilation. Pancreas: Normal contours without ductal dilatation. No peripancreatic fluid collection. Spleen: The spleen is borderline enlarged measuring approximately 12 cm craniocaudad. Adrenals/Urinary Tract: --Adrenal glands: The right adrenal gland is unremarkable. There is a left adrenal myelolipoma that is stable. --Right kidney/ureter: Th there is a stable small exophytic proteinaceous or hemorrhagic cyst arising from the lower pole the right kidney. There is no right-sided hydronephrosis. --Left kidney/ureter: There is no left-sided hydronephrosis. The left kidney is somewhat atrophic with areas of cortical thinning/scarring. --Urinary bladder: Unremarkable. Stomach/Bowel: --Stomach/Duodenum: No hiatal hernia or other gastric abnormality. Normal duodenal course and caliber. --Small bowel: Unremarkable. --Colon: There is scattered colonic diverticula without CT evidence for diverticulitis. There is a large amount of stool throughout the colon. --Appendix: Normal. Vascular/Lymphatic: Atherosclerotic calcification is present within the non-aneurysmal abdominal aorta, without hemodynamically significant stenosis. --No retroperitoneal  lymphadenopathy. --No mesenteric lymphadenopathy. --there is a pathologically enlarged right inguinal lymph node measuring approximately 6 by 3.7 cm (axial series 2, image 71). Reproductive: Status post hysterectomy. No adnexal mass. Other: No ascites or free air. The abdominal wall is normal. Musculoskeletal. There are innumerable lytic lesions throughout the patient's pelvis. The largest is located in the left posterior iliac crest and measures approximately 3.7 cm (axial series 2, image 59). There are small lytic lesions located in the proximal left femur that correspond well with the recent x-ray. There is a stable rounded hyperdense lesion within the marrow cavity of the proximal left femur. This is favored  to be benign given its stability. There is a probable nondisplaced zone 1 fracture through the right sacrum (axial series 2, image 54). There is a probable pathologic zone 1 fracture through the left inferior sacrum (axial series 2, image 66). There is transitional vertebral anatomy with 6 lumbar type vertebral bodies. There is an old compression fracture of the L4 vertebral body. There is a new compression fracture of the L5 vertebral body resulting in approximately 50% height loss. There are lytic lesions throughout the visualized ribs. IMPRESSION: 1. Innumerable lytic lesions throughout the pelvic bones and ribs consistent with osseous metastatic disease. 2. Probable non displaced pathologic and nonpathologic fractures of the sacrum bilaterally. 3. Pathologically enlarged 6 cm right inguinal lymph node. This is amenable to percutaneous ultrasound-guided biopsy if clinically indicated. 4. Bibasilar airspace disease concerning for multifocal pneumonia. There is a trace left-sided pleural effusion. 5. Cholelithiasis with mild gallbladder wall thickening. Correlation with ultrasound is recommended. 6. New compression fracture of the L5 vertebral body with approximately 50% height loss. There is an old L4 compression fracture. There is transitional vertebral anatomy as detailed above. 7. Diverticulosis without CT evidence for diverticulitis. 8. Borderline splenomegaly Aortic Atherosclerosis (ICD10-I70.0). Electronically Signed   By: [name] M.D.   On: 06/02/2020 20:43   DG Chest Port 1 View  Result Date: 06/02/2020 CLINICAL DATA:  Sepsis. EXAM: PORTABLE CHEST 1 VIEW COMPARISON:  09/13/2019 FINDINGS: Heart size is unremarkable. Aortic calcifications are noted. There is atelectasis at the lung bases. No definite large focal infiltrate. No pneumothorax or pleural effusion. Degenerative changes are noted of both glenohumeral joints. IMPRESSION: No active disease. Electronically Signed   By: [name] M.D.   On: 06/02/2020 16:36   DG Hip Unilat W or Wo Pelvis 2-3 Views Left  Result Date: 06/02/2020 CLINICAL DATA:  Pain EXAM: DG HIP (WITH OR WITHOUT PELVIS) 2-3V LEFT COMPARISON:  05/15/2020.  CT dated January 03, 2020 FINDINGS: There are degenerative changes of both hips, left worse than right. Evaluation for fractures is limited by patient body habitus. There are increasing lucencies in the proximal left femur when compared to prior studies. IMPRESSION: 1. Evaluation limited by patient body habitus. 2. No definite acute displaced fracture or dislocation. 3. Increasing lucencies in the proximal left femur raises concern for an underlying lesion. Follow-up with a contrast-enhanced MRI is recommended. Electronically Signed   By: [name] M.D.   On: 06/02/2020 16:35        Scheduled Meds:  apixaban  2.5 mg Oral BID   fentaNYL  1 patch Transdermal Q72H   Continuous Infusions:  azithromycin     cefTRIAXone (ROCEPHIN)  IV 2 g (06/03/20 1024)   lactated ringers 125 mL/hr at 06/02/20 2225     LOS: 0 days    Time spent: 35 minutes.    Dana Allan, MD  [facility] Pager #: [phone] 7PM-7AM contact night coverage as above

## 2020-06-03 NOTE — ED Notes (Signed)
Bil hearing aids are in case charging. The case is close enough to receptacle to stay in her bag while charging.

## 2020-06-03 NOTE — ED Notes (Signed)
VS set for one hour to allow pt to rest

## 2020-06-03 NOTE — TOC Initial Note (Signed)
Transition of Care [facility]) - Initial/Assessment Note    Patient Details  Name: Natasha Ellis MRN: [id number] Date of Birth: 01/24/1931  Transition of Care [facility]) CM/SW Contact:    Lennart Pall, LCSW Phone Number: 06/03/2020, 4:17 PM  Clinical Narrative:                 Met briefly with pt and daughter, [name], this morning to introduce role and discuss anticipated dc plans.  Pt very quiet but does offer short yes/no responses.  Daughter confirms that pt has been in [place] at [facility] for several years and moved recently into memory care.  Daughter anticipates need for SNF when she is medically ready for dc.   Spoke with Lindajo Royal, Adm. Dir at [employer], who confirms plan for pt to return to SNF bed and that she is in communication with pt's daughter as well.   TOC to continue following for dc readiness.  Expected Discharge Plan: [place] Barriers to Discharge: Continued Medical Work up   Patient Goals and CMS Choice        Expected Discharge Plan and Services Expected Discharge Plan: [place] In-house Referral: Clinical Social Work     Living arrangements for the past 2 months: [place] (has been in the memory care unit at [facility] since 04/30/20)                                      Prior Living Arrangements/Services Living arrangements for the past 2 months: [place] (has been in the memory care unit at [facility] since 04/30/20) Lives with:: Facility Resident Patient language and need for interpreter reviewed:: Yes Do you feel safe going back to the place where you live?: Yes      Need for Family Participation in Patient Care: No (Comment) Care giver support system in place?: Yes (comment)   Criminal Activity/Legal Involvement Pertinent to Current Situation/Hospitalization: No - Comment as needed  Activities of Daily Living Home Assistive Devices/Equipment: [place] Rile (specify type) (four wheel walker  ) ADL Screening (condition at time of admission) Patient's cognitive ability adequate to safely complete daily activities?: Yes Is the patient deaf or have difficulty hearing?: Yes Does the patient have difficulty seeing, even when wearing glasses/contacts?: No Does the patient have difficulty concentrating, remembering, or making decisions?: No Patient able to express need for assistance with ADLs?: Yes Does the patient have difficulty dressing or bathing?: Yes Independently performs ADLs?: No Communication: Independent Dressing (OT): Needs assistance Is this a change from baseline?: Pre-admission baseline Grooming: Needs assistance Is this a change from baseline?: Pre-admission baseline Feeding: Needs assistance Is this a change from baseline?: Pre-admission baseline Bathing: Needs assistance Is this a change from baseline?: Pre-admission baseline Toileting: Needs assistance Is this a change from baseline?: Pre-admission baseline In/Out Bed: Needs assistance Is this a change from baseline?: Pre-admission baseline Walks in Home: Needs assistance Is this a change from baseline?: Pre-admission baseline Does the patient have difficulty walking or climbing stairs?: Yes Weakness of Legs: Both Weakness of Arms/Hands: Both  Permission Sought/Granted Permission sought to share information with : Facility Sport and exercise psychologist, Family Supports Permission granted to share information with : Yes, Verbal Permission Granted  Share Information with NAME: Lindajo Royal     Permission granted to share info w Relationship: Admissions Dir     Emotional Assessment Appearance:: Appears stated age Attitude/Demeanor/Rapport: Guarded Affect (typically observed):  Quiet Orientation: : Oriented to Self, Oriented to Place, Oriented to  Time, Oriented to Situation Alcohol / Substance Use: Not Applicable Psych Involvement: No (comment)  Admission diagnosis:  Multifocal pneumonia [J18.9] Community  acquired pneumonia, unspecified laterality [J18.9] Patient Active Problem List   Diagnosis Date Noted  . 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 Rectal bleeding 01/02/2020  . PAF (paroxysmal atrial fibrillation) ([place]) 01/02/2020  . Goals of care, counseling/discussion 11/14/2019  . CKD (chronic kidney disease) stage 3, GFR 30-59 ml/min (HCC) 01/08/2019  . AKI (acute kidney injury) ([place]) 01/08/2019  . Physical deconditioning 09/15/2018  . Bronchiectasis ([place]) 09/06/2018  . Lung nodule 09/06/2018  . Ascending aortic aneurysm ([place]) 09/06/2018  . Hypoxia 08/15/2016  . Severe obesity (BMI >= 40) ([place]) 07/30/2015  . Multifocal pneumonia 06/19/2015  . Anemia 06/10/2015  . Anemia due to other cause 06/10/2015  . OSA (obstructive sleep apnea) 09/20/2014  . History of lymphoma 09/20/2014  . History of TIA (transient ischemic attack) 09/20/2014  . Diarrhea   . Pain in gums 08/15/2014  . Neutropenia ([place]) 08/15/2014  . Hypersensitivity reaction 05/31/2014  . Waldenstrom's macroglobulinemia ([place]) 04/19/2014  . HTN (hypertension) 04/29/2013  . HLD (hyperlipidemia) 04/29/2013  . Lacunar infarction ([place]) 04/29/2013  . GERD (gastroesophageal reflux disease) 04/29/2013  . TIA (transient ischemic attack) 04/29/2013  . Back pain 04/29/2013  . Asthma 04/29/2013  . Dyspnea 01/25/2013  . Chronic cough 01/01/2013   PCP:  Lavone Orn, MD Pharmacy:   [place], [place] [address] Phone: [phone] Fax: [place], [place], [address] [place], [place] [address] [place] [address] Phone: [phone] Fax: [place], [place] [address] [facility]. [address] [place] [address] Phone: [phone] Fax: [phone]  [place], [place] [address] [place] [address] [place] [address] Phone: [phone] Fax: [place], [place] [address] Phone: [phone] Fax: [phone]     Social Determinants of Health ([place]) Interventions    Readmission Risk Interventions Readmission Risk Prevention Plan 01/09/2019 01/09/2019  Transportation Screening Complete Complete  PCP or Specialist Appt within 3-5 Days Not Complete Not Complete  Not Complete comments not ready for d/c -  [place] or Home Care Consult Complete -  Social Work Consult for [place] Planning/Counseling Complete -  Palliative Care Screening Not Applicable -  Medication Review (RN Care Manager) Complete -  Some recent data might be hidden

## 2020-06-03 NOTE — Progress Notes (Signed)
At approximately 1900 patient was found to have a heart rate sustaining 150-160's. Rapid response was called and patient was transferred to ICU. Report given at bedside. Daughter, [name] called and updated of patients transfer.

## 2020-06-03 NOTE — ED Notes (Signed)
Pt is on 2 L [place] and has been since 0400 when report received.

## 2020-06-03 NOTE — Progress Notes (Signed)
   06/03/20 1905  Assess: MEWS Score  BP (!) 118/50  Pulse Rate (!) 149  Assess: MEWS Score  MEWS Temp 0  MEWS Systolic 0  MEWS Pulse 3  MEWS RR 0  MEWS LOC 0  MEWS Score 3  MEWS Score Color Yellow  Assess: if the MEWS score is Yellow or Red  Were vital signs taken at a resting state? Yes  Focused Assessment Change from prior assessment (see assessment flowsheet)  Early Detection of Sepsis Score *See Row Information* High  MEWS guidelines implemented *See Row Information* Yes  Treat  MEWS Interventions Administered prn meds/treatments;Escalated (See documentation below)  Take Vital Signs  Increase Vital Sign Frequency  Yellow: Q 2hr X 2 then Q 4hr X 2, if remains yellow, continue Q 4hrs  Escalate  MEWS: Escalate Yellow: discuss with charge nurse/RN and consider discussing with provider and RRT  Notify: Charge Nurse/RN  Name of Charge Nurse/RN Notified Kim RN   Date Charge Nurse/RN Notified 06/03/20  Time Charge Nurse/RN Notified 1905  Notify: Rapid Response  Name of Rapid Response RN Notified Mel Almond RN   Date Rapid Response Notified 06/03/20  Time Rapid Response Notified 1907  Document  Patient Outcome Transferred/level of care increased  Progress note created (see row info) Yes

## 2020-06-04 DIAGNOSIS — S329XXA Fracture of unspecified parts of lumbosacral spine and pelvis, initial encounter for closed fracture: Secondary | ICD-10-CM | POA: Diagnosis present

## 2020-06-04 DIAGNOSIS — D630 Anemia in neoplastic disease: Secondary | ICD-10-CM | POA: Diagnosis present

## 2020-06-04 DIAGNOSIS — J189 Pneumonia, unspecified organism: Secondary | ICD-10-CM | POA: Diagnosis present

## 2020-06-04 DIAGNOSIS — C8588 Other specified types of non-Hodgkin lymphoma, lymph nodes of multiple sites: Secondary | ICD-10-CM | POA: Diagnosis not present

## 2020-06-04 DIAGNOSIS — I129 Hypertensive chronic kidney disease with stage 1 through stage 4 chronic kidney disease, or unspecified chronic kidney disease: Secondary | ICD-10-CM | POA: Diagnosis present

## 2020-06-04 DIAGNOSIS — D649 Anemia, unspecified: Secondary | ICD-10-CM | POA: Diagnosis not present

## 2020-06-04 DIAGNOSIS — N1831 Chronic kidney disease, stage 3a: Secondary | ICD-10-CM | POA: Diagnosis present

## 2020-06-04 DIAGNOSIS — C8590 Non-Hodgkin lymphoma, unspecified, unspecified site: Secondary | ICD-10-CM | POA: Diagnosis present

## 2020-06-04 DIAGNOSIS — I4510 Unspecified right bundle-branch block: Secondary | ICD-10-CM | POA: Diagnosis present

## 2020-06-04 DIAGNOSIS — J440 Chronic obstructive pulmonary disease with acute lower respiratory infection: Secondary | ICD-10-CM | POA: Diagnosis present

## 2020-06-04 DIAGNOSIS — C7951 Secondary malignant neoplasm of bone: Secondary | ICD-10-CM | POA: Diagnosis present

## 2020-06-04 DIAGNOSIS — Z20822 Contact with and (suspected) exposure to covid-19: Secondary | ICD-10-CM | POA: Diagnosis present

## 2020-06-04 DIAGNOSIS — Z9289 Personal history of other medical treatment: Secondary | ICD-10-CM | POA: Diagnosis not present

## 2020-06-04 DIAGNOSIS — Z66 Do not resuscitate: Secondary | ICD-10-CM | POA: Diagnosis present

## 2020-06-04 DIAGNOSIS — I10 Essential (primary) hypertension: Secondary | ICD-10-CM | POA: Diagnosis not present

## 2020-06-04 DIAGNOSIS — Z7401 Bed confinement status: Secondary | ICD-10-CM | POA: Diagnosis not present

## 2020-06-04 DIAGNOSIS — N179 Acute kidney failure, unspecified: Secondary | ICD-10-CM | POA: Diagnosis present

## 2020-06-04 DIAGNOSIS — R52 Pain, unspecified: Secondary | ICD-10-CM | POA: Diagnosis not present

## 2020-06-04 DIAGNOSIS — J9601 Acute respiratory failure with hypoxia: Secondary | ICD-10-CM | POA: Diagnosis not present

## 2020-06-04 DIAGNOSIS — K8020 Calculus of gallbladder without cholecystitis without obstruction: Secondary | ICD-10-CM | POA: Diagnosis present

## 2020-06-04 DIAGNOSIS — F419 Anxiety disorder, unspecified: Secondary | ICD-10-CM | POA: Diagnosis present

## 2020-06-04 DIAGNOSIS — C7952 Secondary malignant neoplasm of bone marrow: Secondary | ICD-10-CM | POA: Diagnosis present

## 2020-06-04 DIAGNOSIS — C880 Waldenstrom macroglobulinemia: Secondary | ICD-10-CM | POA: Diagnosis present

## 2020-06-04 DIAGNOSIS — I517 Cardiomegaly: Secondary | ICD-10-CM | POA: Diagnosis not present

## 2020-06-04 DIAGNOSIS — M8458XA Pathological fracture in neoplastic disease, other specified site, initial encounter for fracture: Secondary | ICD-10-CM | POA: Diagnosis present

## 2020-06-04 DIAGNOSIS — J157 Pneumonia due to Mycoplasma pneumoniae: Secondary | ICD-10-CM | POA: Diagnosis not present

## 2020-06-04 DIAGNOSIS — I480 Paroxysmal atrial fibrillation: Secondary | ICD-10-CM | POA: Diagnosis present

## 2020-06-04 DIAGNOSIS — N1832 Chronic kidney disease, stage 3b: Secondary | ICD-10-CM | POA: Diagnosis not present

## 2020-06-04 DIAGNOSIS — Z6841 Body Mass Index (BMI) 40.0 and over, adult: Secondary | ICD-10-CM | POA: Diagnosis not present

## 2020-06-04 DIAGNOSIS — D701 Agranulocytosis secondary to cancer chemotherapy: Secondary | ICD-10-CM | POA: Diagnosis present

## 2020-06-04 DIAGNOSIS — G893 Neoplasm related pain (acute) (chronic): Secondary | ICD-10-CM | POA: Diagnosis present

## 2020-06-04 DIAGNOSIS — R0602 Shortness of breath: Secondary | ICD-10-CM | POA: Diagnosis not present

## 2020-06-04 DIAGNOSIS — S3210XA Unspecified fracture of sacrum, initial encounter for closed fracture: Secondary | ICD-10-CM | POA: Diagnosis present

## 2020-06-04 DIAGNOSIS — M2550 Pain in unspecified joint: Secondary | ICD-10-CM | POA: Diagnosis not present

## 2020-06-04 DIAGNOSIS — X58XXXA Exposure to other specified factors, initial encounter: Secondary | ICD-10-CM | POA: Diagnosis present

## 2020-06-04 LAB — CBC WITH DIFFERENTIAL/PLATELET
Abs Immature Granulocytes: 0.09 10*3/uL — ABNORMAL HIGH (ref 0.00–0.07)
Basophils Absolute: 0 10*3/uL (ref 0.0–0.1)
Basophils Relative: 0 %
Eosinophils Absolute: 0.1 10*3/uL (ref 0.0–0.5)
Eosinophils Relative: 1 %
HCT: 18.2 % — ABNORMAL LOW (ref 36.0–46.0)
Hemoglobin: 6.8 g/dL — CL (ref 12.0–15.0)
Immature Granulocytes: 1 %
Lymphocytes Relative: 6 %
Lymphs Abs: 0.4 10*3/uL — ABNORMAL LOW (ref 0.7–4.0)
MCH: 37.4 pg — ABNORMAL HIGH (ref 26.0–34.0)
MCHC: 37.4 g/dL — ABNORMAL HIGH (ref 30.0–36.0)
MCV: 100 fL (ref 80.0–100.0)
Monocytes Absolute: 0.5 10*3/uL (ref 0.1–1.0)
Monocytes Relative: 8 %
Neutro Abs: 5.4 10*3/uL (ref 1.7–7.7)
Neutrophils Relative %: 84 %
Platelets: 428 10*3/uL — ABNORMAL HIGH (ref 150–400)
RBC: 1.82 MIL/uL — ABNORMAL LOW (ref 3.87–5.11)
RDW: 25.4 % — ABNORMAL HIGH (ref 11.5–15.5)
WBC: 6.4 10*3/uL (ref 4.0–10.5)
nRBC: 0 % (ref 0.0–0.2)

## 2020-06-04 LAB — RENAL FUNCTION PANEL
Albumin: 2.4 g/dL — ABNORMAL LOW (ref 3.5–5.0)
Anion gap: 12 (ref 5–15)
BUN: 42 mg/dL — ABNORMAL HIGH (ref 8–23)
CO2: 20 mmol/L — ABNORMAL LOW (ref 22–32)
Calcium: 8.7 mg/dL — ABNORMAL LOW (ref 8.9–10.3)
Chloride: 105 mmol/L (ref 98–111)
Creatinine, Ser: 1.86 mg/dL — ABNORMAL HIGH (ref 0.44–1.00)
GFR, Estimated: 26 mL/min — ABNORMAL LOW (ref >60)
Glucose, Bld: 99 mg/dL (ref 70–99)
Phosphorus: 4.2 mg/dL (ref 2.5–4.6)
Potassium: 4.6 mmol/L (ref 3.5–5.1)
Sodium: 137 mmol/L (ref 135–145)

## 2020-06-04 LAB — TRANSFERRIN: Transferrin: 86 mg/dL — ABNORMAL LOW (ref 192–382)

## 2020-06-04 LAB — PREPARE RBC (CROSSMATCH)

## 2020-06-04 LAB — IRON AND TIBC
Iron: 34 ug/dL (ref 28–170)
Saturation Ratios: 28 % (ref 10.4–31.8)
TIBC: 120 ug/dL — ABNORMAL LOW (ref 250–450)
UIBC: 86 ug/dL

## 2020-06-04 LAB — FERRITIN: Ferritin: 2152 ng/mL — ABNORMAL HIGH (ref 11–307)

## 2020-06-04 LAB — MAGNESIUM: Magnesium: 2.2 mg/dL (ref 1.7–2.4)

## 2020-06-04 LAB — URIC ACID: Uric Acid, Serum: 8.1 mg/dL — ABNORMAL HIGH (ref 2.5–7.1)

## 2020-06-04 MED ORDER — ALLOPURINOL 100 MG PO TABS
100.0000 mg | ORAL_TABLET | Freq: Two times a day (BID) | ORAL | Status: DC
  Administered 2020-06-04 – 2020-06-12 (×15): 100 mg via ORAL
  Filled 2020-06-04 (×16): qty 1

## 2020-06-04 MED ORDER — GABAPENTIN 100 MG PO CAPS
100.0000 mg | ORAL_CAPSULE | Freq: Three times a day (TID) | ORAL | Status: DC
  Administered 2020-06-04 – 2020-06-06 (×6): 100 mg via ORAL
  Filled 2020-06-04 (×6): qty 1

## 2020-06-04 MED ORDER — CALCIUM CARBONATE-VITAMIN D 500-200 MG-UNIT PO TABS
1.0000 | ORAL_TABLET | Freq: Every day | ORAL | Status: DC
  Administered 2020-06-05 – 2020-06-12 (×6): 1 via ORAL
  Filled 2020-06-04 (×8): qty 1

## 2020-06-04 MED ORDER — ACETAMINOPHEN 325 MG PO TABS
650.0000 mg | ORAL_TABLET | Freq: Four times a day (QID) | ORAL | Status: DC
  Administered 2020-06-04 – 2020-06-12 (×28): 650 mg via ORAL
  Filled 2020-06-04 (×29): qty 2

## 2020-06-04 MED ORDER — ALBUTEROL SULFATE HFA 108 (90 BASE) MCG/ACT IN AERS: 1.0000 | INHALATION_SPRAY | RESPIRATORY_TRACT | Status: DC | PRN

## 2020-06-04 MED ORDER — SODIUM CHLORIDE 0.9% IV SOLUTION: Freq: Once | INTRAVENOUS | Status: DC

## 2020-06-04 MED ORDER — POLYETHYLENE GLYCOL 3350 17 G PO PACK: 17.0000 g | PACK | Freq: Every day | ORAL | Status: DC | PRN

## 2020-06-04 MED ORDER — CHOLESTYRAMINE LIGHT 4 G PO PACK
4.0000 g | PACK | Freq: Every day | ORAL | Status: DC | PRN
  Filled 2020-06-04: qty 1

## 2020-06-04 MED ORDER — FLUTICASONE FUROATE-VILANTEROL 200-25 MCG/INH IN AEPB
1.0000 | INHALATION_SPRAY | Freq: Every day | RESPIRATORY_TRACT | Status: DC
  Administered 2020-06-05 – 2020-06-12 (×8): 1 via RESPIRATORY_TRACT
  Filled 2020-06-04: qty 28

## 2020-06-04 MED ORDER — BOOST PLUS PO LIQD
237.0000 mL | Freq: Two times a day (BID) | ORAL | Status: DC
  Administered 2020-06-05: 237 mL via ORAL
  Filled 2020-06-04 (×2): qty 237

## 2020-06-04 MED ORDER — AZITHROMYCIN 250 MG PO TABS
500.0000 mg | ORAL_TABLET | Freq: Every day | ORAL | Status: AC
  Administered 2020-06-04 – 2020-06-07 (×4): 500 mg via ORAL
  Filled 2020-06-04 (×4): qty 2

## 2020-06-04 MED ORDER — FLUTICASONE PROPIONATE 50 MCG/ACT NA SUSP
1.0000 | Freq: Two times a day (BID) | NASAL | Status: DC | PRN
  Filled 2020-06-04: qty 16

## 2020-06-04 MED ORDER — HYDROCORTISONE (PERIANAL) 2.5 % EX CREA
1.0000 "application " | TOPICAL_CREAM | Freq: Every day | CUTANEOUS | Status: DC | PRN
  Filled 2020-06-04: qty 28.35

## 2020-06-04 MED ORDER — FUROSEMIDE 10 MG/ML IJ SOLN
20.0000 mg | Freq: Once | INTRAMUSCULAR | Status: AC
  Administered 2020-06-04: 20 mg via INTRAVENOUS
  Filled 2020-06-04: qty 2

## 2020-06-04 MED ORDER — ALLOPURINOL 100 MG PO TABS
100.0000 mg | ORAL_TABLET | Freq: Every day | ORAL | Status: DC
  Filled 2020-06-04: qty 1

## 2020-06-04 MED ORDER — SODIUM CHLORIDE 0.9% IV SOLUTION: Freq: Once | INTRAVENOUS | Status: AC

## 2020-06-04 MED ORDER — DILTIAZEM HCL ER COATED BEADS 180 MG PO CP24
180.0000 mg | ORAL_CAPSULE | Freq: Every day | ORAL | Status: DC
  Administered 2020-06-05 – 2020-06-12 (×8): 180 mg via ORAL
  Filled 2020-06-04 (×8): qty 1

## 2020-06-04 MED ORDER — DEXTROMETHORPHAN POLISTIREX ER 30 MG/5ML PO SUER
60.0000 mg | Freq: Two times a day (BID) | ORAL | Status: DC | PRN
  Filled 2020-06-04: qty 10

## 2020-06-04 MED ORDER — GUAIFENESIN ER 600 MG PO TB12
600.0000 mg | ORAL_TABLET | Freq: Two times a day (BID) | ORAL | Status: DC
  Administered 2020-06-04 – 2020-06-05 (×2): 600 mg via ORAL
  Filled 2020-06-04 (×2): qty 1

## 2020-06-04 MED ORDER — DILTIAZEM HCL ER COATED BEADS 180 MG PO CP24: 180.0000 mg | ORAL_CAPSULE | Freq: Every day | ORAL | Status: DC

## 2020-06-04 MED ORDER — DILTIAZEM HCL ER COATED BEADS 180 MG PO CP24
180.0000 mg | ORAL_CAPSULE | Freq: Every day | ORAL | Status: DC
  Administered 2020-06-04: 180 mg via ORAL
  Filled 2020-06-04: qty 1

## 2020-06-04 MED ORDER — PANTOPRAZOLE SODIUM 40 MG PO TBEC
40.0000 mg | DELAYED_RELEASE_TABLET | Freq: Every day | ORAL | Status: DC
  Administered 2020-06-05 – 2020-06-12 (×8): 40 mg via ORAL
  Filled 2020-06-04 (×8): qty 1

## 2020-06-04 NOTE — TOC Progression Note (Signed)
Transition of Care [facility]) - Progression Note    Patient Details  Name: Natasha Ellis MRN: [id number] Date of Birth: [birth year]  Transition of Care [facility]) CM/SW Contact  Leeroy Cha, RN Phone Number: 06/04/2020, 7:18 AM  Clinical Narrative:    86 year old Caucasian female, morbidly obese, with past medical history significant for white restaurants and a chill, with known metastatic disease and bone marrow infiltration.  Patient has chronic left hip and right arm pain.  Patient is currently on chemotherapy.  Patient was admitted with chills and cough.  CT scan of the abdomen and pelvis done revealed multifocal pneumonia.  It also revealed bony metastasis to the pelvis, and nondisplaced pathological sacral fractures.  Patient is currently on azithromycin and Rocephin.  Oncology team has been consulted to assist with patient's  [id number]/ transferred to sdu due to a.fib and started on iv cardizem drip.  Expected Discharge Plan: [place] Barriers to Discharge: Continued Medical Work up  Expected Discharge Plan and Services Expected Discharge Plan: [place] In-house Referral: Clinical Social Work     Living arrangements for the past 2 months: [place] (has been in the memory care unit at [facility] since 04/30/20)                                       Social Determinants of Health (SDOH) Interventions    Readmission Risk Interventions Readmission Risk Prevention Plan 01/09/2019 01/09/2019  Transportation Screening Complete Complete  PCP or Specialist Appt within 3-5 Days Not Complete Not Complete  Not Complete comments not ready for d/c -  [place] or Home Care Consult Complete -  Social Work Consult for [place] Planning/Counseling Complete -  Palliative Care Screening Not Applicable -  Medication Review Press photographer) Complete -  Some recent data might be hidden

## 2020-06-04 NOTE — Progress Notes (Signed)
CRITICAL VALUE ALERT  Critical Value:  hgb 6.8  Date & Time Notied:  06/04/20 0430  Provider Notified: triad  Orders Received/Actions taken: transfuse 1 unit

## 2020-06-04 NOTE — Significant Event (Signed)
Rapid Response Event Note   Reason for Call : Call regarding heart rate of 140-150, nurse reports patient with history of AFib   Initial Focused Assessment:  EKG completed that shows Afib with RVR, Patient A/O x 4      Interventions:  Transfer to SD for closer monitoring.   Plan of Care:  Digoxin per order      MD Notified: M.[facility] FNP  Call Time: [place] Time: 1905 End Time: [place] Iria Jamerson, RN

## 2020-06-04 NOTE — Progress Notes (Addendum)
HEMATOLOGY-ONCOLOGY PROGRESS NOTE  SUBJECTIVE: The patient was transferred to stepdown last night due to A. fib with RVR.  Rate is now controlled.  Reports pain is significantly better this morning.  Oncology History  Waldenstrom's macroglobulinemia ([place])  04/19/2014 Initial Diagnosis   Waldenstrom's macroglobulinemia ([place])   11/27/2019 - 12/27/2019 Chemotherapy   The patient had palonosetron (ALOXI) injection 0.25 mg, 0.25 mg, Intravenous,  Once, 2 of 4 cycles Administration: 0.25 mg (11/27/2019), 0.25 mg (12/26/2019) bendamustine (BENDEKA) 175 mg in sodium chloride 0.9 % 50 mL (3.0702 mg/mL) chemo infusion, 70 mg/m2 = 175 mg (100 % of original dose 70 mg/m2), Intravenous,  Once, 2 of 4 cycles Dose modification: 70 mg/m2 (original dose 70 mg/m2, Cycle 1, Reason: Provider Judgment) Administration: 175 mg (11/27/2019), 175 mg (11/28/2019), 175 mg (12/26/2019), 175 mg (12/27/2019) riTUXimab-pvvr (RUXIENCE) 900 mg in sodium chloride 0.9 % 250 mL (2.6471 mg/mL) infusion, 375 mg/m2 = 900 mg, Intravenous,  Once, 2 of 4 cycles Administration: 900 mg (11/27/2019), 900 mg (12/26/2019)  for chemotherapy treatment.    05/09/2020 -  Chemotherapy   The patient had palonosetron (ALOXI) injection 0.25 mg, 0.25 mg, Intravenous,  Once, 2 of 4 cycles Administration: 0.25 mg (05/09/2020), 0.25 mg (05/31/2020) pentostatin (NIPENT) 4.8 mg in sodium chloride 0.9 % 50 mL chemo infusion, 2 mg/m2 = 4.8 mg (100 % of original dose 2 mg/m2), Intravenous,  Once, 2 of 4 cycles Dose modification: 2 mg/m2 (original dose 2 mg/m2, Cycle 1, Reason: Provider Judgment) Administration: 4.8 mg (05/09/2020), 4.8 mg (05/31/2020) cyclophosphamide (CYTOXAN) 920 mg in sodium chloride 0.9 % 250 mL chemo infusion, 400 mg/m2 = 920 mg (100 % of original dose 400 mg/m2), Intravenous,  Once, 2 of 4 cycles Dose modification: 400 mg/m2 (original dose 400 mg/m2, Cycle 1, Reason: Provider Judgment) Administration: 920 mg (05/09/2020), 920 mg  (05/31/2020) riTUXimab-pvvr (RUXIENCE) 900 mg in sodium chloride 0.9 % 250 mL (2.6471 mg/mL) infusion, 375 mg/m2 = 900 mg, Intravenous,  Once, 2 of 4 cycles Administration: 900 mg (05/09/2020), 900 mg (05/31/2020)  for chemotherapy treatment.     PHYSICAL EXAMINATION:  Vitals:   06/04/20 0645 06/04/20 0700  BP: (!) 121/44 (!) 107/42  Pulse: 90 91  Resp: (!) 23 (!) 29  Temp:    SpO2: 97% 98%   Filed Weights   06/02/20 1511 06/03/20 0958 06/03/20 2000  Weight: 115.7 kg 121.5 kg 121.5 kg    Intake/Output from previous day: 11/01 0701 - 11/02 0700 In: 789.7 [I.V.:31; Blood:315; IV Piggyback:443.7] Out: 500 [Urine:500]  GENERAL: Awake and alert, no distress OROPHARYNX: No thrush or mucositis LYMPH: Palpable right inguinal lymph node measuring approximately 6 cm LUNGS: Diminished breath sounds bilaterally HEART: regular rate & rhythm  ABDOMEN:abdomen soft, non-tender and normal bowel sounds NEURO: alert & oriented x 3 with fluent speech, no focal motor/sensory deficits Musculoskeletal: Tender at the left ischium  LABORATORY DATA:  I have reviewed the data as listed CMP Latest Ref Rng & Units 06/04/2020 06/03/2020 06/02/2020  Glucose 70 - 99 mg/dL 99 96 111(H)  BUN 8 - 23 mg/dL 42(H) 43(H) 41(H)  Creatinine 0.44 - 1.00 mg/dL 1.86(H) 2.01(H) 2.01(H)  Sodium 135 - 145 mmol/L 137 136 138  Potassium 3.5 - 5.1 mmol/L 4.6 5.0 4.7  Chloride 98 - 111 mmol/L 105 103 102  CO2 22 - 32 mmol/L 20(L) 21(L) 22  Calcium 8.9 - 10.3 mg/dL 8.7(L) 8.7(L) 9.1  Total Protein 6.5 - 8.1 g/dL - 7.9 8.5(H)  Total Bilirubin 0.3 - 1.2 mg/dL -  1.3(H) 0.9  Alkaline Phos 38 - 126 U/L - 90 91  AST 15 - 41 U/L - 21 18  ALT 0 - 44 U/L - 10 7    Lab Results  Component Value Date   WBC 6.4 06/04/2020   HGB 6.8 (LL) 06/04/2020   HCT 18.2 (L) 06/04/2020   MCV 100.0 06/04/2020   PLT 428 (H) 06/04/2020   NEUTROABS 5.4 06/04/2020    CT Abdomen Pelvis Wo Contrast  Result Date: 06/02/2020 CLINICAL DATA:   Abdominal pain and fever. EXAM: CT ABDOMEN AND PELVIS WITHOUT CONTRAST TECHNIQUE: Multidetector CT imaging of the abdomen and pelvis was performed following the standard protocol without IV contrast. COMPARISON:  CT dated January 03, 2020 FINDINGS: Lower chest: Multiple airspace opacities are noted at the lung bases bilaterally. There is associated bronchial wall thickening and mucous plugging. The heart size is normal. There are coronary artery calcifications.The heart size is normal. There is a trace left-sided pleural effusion. Hepatobiliary: The liver is normal. There is cholelithiasis with questionable mild gallbladder wall thickening.There is no biliary ductal dilation. Pancreas: Normal contours without ductal dilatation. No peripancreatic fluid collection. Spleen: The spleen is borderline enlarged measuring approximately 12 cm craniocaudad. Adrenals/Urinary Tract: --Adrenal glands: The right adrenal gland is unremarkable. There is a left adrenal myelolipoma that is stable. --Right kidney/ureter: Th there is a stable small exophytic proteinaceous or hemorrhagic cyst arising from the lower pole the right kidney. There is no right-sided hydronephrosis. --Left kidney/ureter: There is no left-sided hydronephrosis. The left kidney is somewhat atrophic with areas of cortical thinning/scarring. --Urinary bladder: Unremarkable. Stomach/Bowel: --Stomach/Duodenum: No hiatal hernia or other gastric abnormality. Normal duodenal course and caliber. --Small bowel: Unremarkable. --Colon: There is scattered colonic diverticula without CT evidence for diverticulitis. There is a large amount of stool throughout the colon. --Appendix: Normal. Vascular/Lymphatic: Atherosclerotic calcification is present within the non-aneurysmal abdominal aorta, without hemodynamically significant stenosis. --No retroperitoneal lymphadenopathy. --No mesenteric lymphadenopathy. --there is a pathologically enlarged right inguinal lymph node measuring  approximately 6 by 3.7 cm (axial series 2, image 71). Reproductive: Status post hysterectomy. No adnexal mass. Other: No ascites or free air. The abdominal wall is normal. Musculoskeletal. There are innumerable lytic lesions throughout the patient's pelvis. The largest is located in the left posterior iliac crest and measures approximately 3.7 cm (axial series 2, image 59). There are small lytic lesions located in the proximal left femur that correspond well with the recent x-ray. There is a stable rounded hyperdense lesion within the marrow cavity of the proximal left femur. This is favored to be benign given its stability. There is a probable nondisplaced zone 1 fracture through the right sacrum (axial series 2, image 54). There is a probable pathologic zone 1 fracture through the left inferior sacrum (axial series 2, image 66). There is transitional vertebral anatomy with 6 lumbar type vertebral bodies. There is an old compression fracture of the L4 vertebral body. There is a new compression fracture of the L5 vertebral body resulting in approximately 50% height loss. There are lytic lesions throughout the visualized ribs. IMPRESSION: 1. Innumerable lytic lesions throughout the pelvic bones and ribs consistent with osseous metastatic disease. 2. Probable non displaced pathologic and nonpathologic fractures of the sacrum bilaterally. 3. Pathologically enlarged 6 cm right inguinal lymph node. This is amenable to percutaneous ultrasound-guided biopsy if clinically indicated. 4. Bibasilar airspace disease concerning for multifocal pneumonia. There is a trace left-sided pleural effusion. 5. Cholelithiasis with mild gallbladder wall thickening. Correlation with ultrasound is  recommended. 6. New compression fracture of the L5 vertebral body with approximately 50% height loss. There is an old L4 compression fracture. There is transitional vertebral anatomy as detailed above. 7. Diverticulosis without CT evidence for  diverticulitis. 8. Borderline splenomegaly Aortic Atherosclerosis (ICD10-I70.0). Electronically Signed   By: [name] M.D.   On: 06/02/2020 20:43   DG Chest Port 1 View  Result Date: 06/02/2020 CLINICAL DATA:  Sepsis. EXAM: PORTABLE CHEST 1 VIEW COMPARISON:  09/13/2019 FINDINGS: Heart size is unremarkable. Aortic calcifications are noted. There is atelectasis at the lung bases. No definite large focal infiltrate. No pneumothorax or pleural effusion. Degenerative changes are noted of both glenohumeral joints. IMPRESSION: No active disease. Electronically Signed   By: [name] M.D.   On: 06/02/2020 16:36   DG Hip Unilat W or W/O Pelvis 1 View Left  Result Date: 05/17/2020 CLINICAL DATA:  History of lymphoma. Hypermetabolic lesion in the left iliac crest on prior PET CT scan. EXAM: DG HIP (WITH OR WITHOUT PELVIS) 1V*L* COMPARISON:  PET CT scan 03/27/2019 and CT abdomen and pelvis 01/03/2020. FINDINGS: This examination is limited as the patient was unable to stand or lie on the table for imaging. There is subtle lucency in the anterior right ilium compatible with the lesion seen on the prior studies. No other abnormality is identified. IMPRESSION: Markedly limited study demonstrating a lucent lesion in the right ilium worrisome for a lymphomatous deposit as seen on the prior studies. Electronically Signed   By: Inge Rise M.D.   On: 05/17/2020 09:17   DG Hip Unilat W or Wo Pelvis 2-3 Views Left  Result Date: 06/02/2020 CLINICAL DATA:  Pain EXAM: DG HIP (WITH OR WITHOUT PELVIS) 2-3V LEFT COMPARISON:  05/15/2020.  CT dated January 03, 2020 FINDINGS: There are degenerative changes of both hips, left worse than right. Evaluation for fractures is limited by patient body habitus. There are increasing lucencies in the proximal left femur when compared to prior studies. IMPRESSION: 1. Evaluation limited by patient body habitus. 2. No definite acute displaced fracture or dislocation. 3.  Increasing lucencies in the proximal left femur raises concern for an underlying lesion. Follow-up with a contrast-enhanced MRI is recommended. Electronically Signed   By: [name] M.D.   On: 06/02/2020 16:35    ASSESSMENT AND PLAN: 1. 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 Treated with multiple systemic therapies including pentostatin/Cytoxan/rituximab in 2010.   Cycle 1 bendamustine/Rituxan 05/31/2014.  Cycle 2 bendamustine/Rituxan 07/02/2014  Cycle 3 bendamustine/rituximab 07/30/2014  Cycle 4 bendamustine/rituximab 09/10/2014  Cycle 5 bendamustine/rituximab 10/22/2014  Cycle 6 bendamustine/rituximab 12/10/2014  PET scan 03/27/2019-diffuse small hypermetabolic lymph nodes in the neck, chest, abdomen, and pelvis, heterogenous hypermetabolism in the bones  CT chest 10/02/2019-bronchiectasis at the lung bases, 13 mm nodular area at the medial left hemidiaphragm, mildly enlarged mediastinal nodes-decreased from June 2020, slightly enlarged left axillary node-9 mm  Rising [name] level March and April 2021  Cycle 1 bendamustine/rituximab 11/27/2019  Cycle 2 bendamustine/Rituxan 12/26/2019  01/22/2020 [name] 4225 (stable)   Cycle 1 cyclophosphamide, pentostatin, rituximab 05/09/2020  Cycle 2 cyclophosphamide, pentostatin, rituximab 05/31/2020 2. Left knee arthritis. Followed by Dr. Wynelle Link. 3. Sleep apnea. 4. Anemia. microcytic with peripheral blood smear findings consistent with iron deficiency 07/09/2015, serum iron studies consistent with "anemia of chronic disease ", stool Hemoccult cards negative 07/11/2015 5.  Question left parotitis 12/12/2013 presenting with left facial and parotid edema. 6. Admission with pneumonia February 2016 7. History of Neutropenia secondary to chemotherapy, now with  moderate neutropenia 8. Left lung pneumonia 04/02/2015, sputum culture positive for Moraxella 06/19/2015 9. Low IgG and IgA 10. CT chest 05/13/2015 with bilateral lower lung consolidation 11. CT chest, abdomen, and pelvis 01/08/2016-new peribronchial thickening and peripheral tree in bud pattern at the right lower lobe, no lymphadenopathy 12. Sinus mucosal thickening/opacification on CT 08/16/2015-status post sinus surgery 12/05/2015 13. Sputum culture positive for pseudomonas aeruginosa on 01/10/2016-treated with 10 days of ciprofloxacin 14. 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 Admission with pneumonia 08/26/2016 16. Left lung nodule increased in size on a CT 01/06/2019, not hypermetabolic on the PET scan 7/41/2878, followed by Dr. Elsworth Soho 17.Admission with rectal bleeding 01/02/2020-diverticular? 18.  Pain at the right arm and left iliac-likely related to lymphoma, plain x-ray 05/07/2020 at [facility] revealed destructive changes in the right humerus and radius, x-ray of the pelvis 05/15/2020-subtle lucent lesion in the right ilium 19.  Hospital admission 06/02/2020 -pneumonia, AKI, anemia  Ms. Boxx appears stable.  Her pain is much better controlled at this time.  Morphine was discontinued and she is now on IV Dilaudid and oral oxycodone.  We plan to continue current pain medications.  The patient is on IV ceftriaxone for possible multilobar pneumonia.  Remains afebrile and no report of shortness of breath or cough this morning.   The patient's hemoglobin dropped to 6.8 this morning and she is status post 1 unit PRBCs.  Recommendations: 1.  Continue fentanyl patch at the current dose. 2.  Continue as needed Dilaudid and oxycodone.  If pain worsens, will consider radiation oncology consult for palliative radiation to her left iliac bone/sacrum. 3.  Recommend PT evaluation to mobilize the patient. 4.  Limit blood draws.  PRBC transfusion for hemoglobin less than  7. 5.  Resume allopurinol   LOS: 0 days   Mikey Bussing, DNP, AGPCNP-BC, AOCNP 06/04/20 Ms. Delage was interviewed and examined.  She reports improvement in pain today.  The pain appears to localize to the left ischium and left iliac areas.  The acute increase in pain may have been related to pelvic fractures.  The plan is to refer her to radiation oncology if she has persistent severe pain.  We can increase the fentanyl dose if she continues to require frequent breakthrough pain medication.  The anemia is secondary to lymphoma, chemotherapy, renal failure and phlebotomy.  Agree with the red cell transfusion today.  She is now at day 5 following cycle 2 cyclophosphamide, pentostatin, and rituximab for treatment of progressive WaldenStrom's macroglobulinemia.  We will need to consider hospice care if her clinical status does not improve over the next few weeks.

## 2020-06-04 NOTE — Progress Notes (Signed)
Patient arrives to room 1523 from ICU via bed at this time

## 2020-06-04 NOTE — Progress Notes (Signed)
PT Cancellation Note  Patient Details Name: Natasha Ellis MRN: [id number] DOB: [birth year]   Cancelled Treatment:    Reason Eval/Treat Not Completed: Medical issues which prohibited therapy--hgb 6.8-pt to get blood transfusion. Will hold PT for now and check back another day/time.    [facility]  Office: [phone] Pager: [phone]

## 2020-06-04 NOTE — Progress Notes (Signed)
PROGRESS NOTE    Natasha Ellis  EUM:[id number] DOB: 05-27-1931 DOA: 06/02/2020 PCP: Lavone Orn, MD    Brief Narrative:  Mrs. Peaden was admitted to the hospital with a working diagnosis of multifocal pneumonia.  86 year old female with past medical history for Waledestrom's non-Hodgkin's lymphoma, known metastatic disease and bone marrow infiltration.  Currently on chemotherapy.  Last cycle 2 days prior to hospitalization.  At home she developed chills, generalized malaise, fatigue, and cough.  On her initial physical examination blood pressure 116/64, heart rate 97, respiratory rate 35, oxygen saturation 92%, moist mucous membranes, lungs with no wheezing or rhonchi, heart S1-S2, present and rhythmic, soft abdomen, no lower extremity edema Sodium 138, potassium 4.7, chloride 102, bicarb 22, glucose 111, BUN 41, creatinine 2.0, white count 9.1, hemoglobin 7.3, hematocrit 21.5, platelets 534.  SARS COVID-19 negative.  Urinalysis specific gravity 1.015, 6-10 white cells.  Chest radiograph with bibasilar infiltrates, more prominent on the right.  CT of the abdomen with bibasilar infiltrates, on the left retrocardiac. Innumerable lytic lesions throughout the pelvic bones and ribs consistent with osseous metastatic disease. Nondisplaced pathologic and not pathologic fractures of the sacrum bilaterally. Pathologic enlarged 6 cm right inguinal lymph node. Cholelithiasis with mild gallbladder wall thickening. New compression fracture L5 vertebral body with approximately 50% height loss.  Old L4 compression fracture.  Patient has been placed on broad-spectrum antibiotic therapy with good toleration.  She continue to have significant pain related to bony metastasis and fractures.  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 Bilateral bibasilar community acquired pneumonia/ present on admission. Dyspnea is stable, oxygenation is 97% on room air, wbc is 6,4, and blood cultures are with no growth.   Continue antibiotic therapy with ceftriaxone and azithromycin, plan to complete 5 days. Change azithromycin to po formulation.   Add incentive spirometer and flutter valve, albuterol as needed. Patient with limited mobility due to multiple pathologic bone fractures.   2. 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  Salvage systemic therapy 05/31/20 (#2 cycle).  Patient continue to have pain due to bony lesions, it has improved in intensity, but continue to be very severe and is limited her mobility.   Continue fentanyl patch 25 mcg, plus oxycodone for break through pain every 4 H, and hydromorphone for severe pain. Add scheduled acetaminophen.  Resume gabapentin.  Out of bed to chair as tolerate, PT and OT consult. Patient is nursing home resident. Her mobility is limited due to pain.   3. AKI on CKD stage 3a. Renal function with serum cr down to 1,86, K is 4,6 and bicarbonate is 20.  Patient with poor oral intake. Consult nutrition. Continue to hold on IV fluids for now, continue to hold on nephrotoxic medications and avoid hypotension.   Discontinue albumin.   4. Chronic anemia of malignancy/ iron deficiency. Hgb down to 6,8 with Hct at 18,2.  Check iron panel and will transfuse 2 units PRBC, give furosemide in between units 20 mg. Follow up on cell count in am, continue with apixaban for now.  5. Paroxysmal atrial fibrillation with RVR. EKG from 11/01 personally revoewed with rvr 175, right bundle branch block, atrial  fibrillation, with no significant st segment or t wave changes. Rate controlled with IV diltiazem, currently 5 mg/H. This am resume diltiazem 180 mg and wean off drip.   Continue anticoagulation with apixaban.   Patient continue to be at high risk for worsening atrial fibrillation.   Status is: Inpatient  Remains inpatient appropriate because:IV treatments appropriate due to intensity of illness or inability to take PO   Dispo: The patient is from: SNF              Anticipated d/c is to: SNF              Anticipated d/c date is: 3 days              Patient currently is not medically stable to d/c.   DVT prophylaxis: apixaban   Code Status:   DNR   Family Communication:  I spoke over the phone with the patient's daughter about patient's  condition, plan of care, prognosis and all questions were addressed.    Consultants:   Oncology    Antimicrobials:   Ceftriaxone   Azithromycin     Subjective: Patient continue to have generalized pain, mild improvement compared to yesterday but continue not to be at baseline, poor appetite and limited mobility, no nausea or vomiting. Dyspnea stable, no chest pain or palpitations.   Objective: Vitals:   06/04/20 0700 06/04/20 0720 06/04/20 0800 06/04/20 0810  BP: (!) 107/42  (!) 126/93 (!) 114/54  Pulse: 91  92 88  Resp: (!) 29  (!) 26 (!) 24  Temp:  100.2 F (37.9 C)  99.8 F (37.7 C)  TempSrc:  Axillary  Axillary  SpO2: 98%  97% 97%  Weight:      Height:        Intake/Output Summary (Last 24 hours) at 06/04/2020 0846 Last data filed at 06/04/2020 0810 Gross per 24 hour  Intake 1104.71 ml  Output 500 ml  Net 604.71 ml   Filed Weights   06/02/20 1511 06/03/20 0958 06/03/20 2000  Weight: 115.7 kg 121.5 kg 121.5 kg    Examination:   General: deconditioned and in pain.  Neurology: Awake and alert, non focal  E ENT: mild pallor, no icterus, oral mucosa moist Cardiovascular: No JVD. S1-S2 present, rhythmic, no gallops,  rubs, or murmurs. Non pitting edema + bilateral lower extremity edema. Pulmonary: positive breath sounds bilaterally, scattered rhonchi no rales or wheezing.  Gastrointestinal. Abdomen soft and non tender Skin. No rashes Musculoskeletal: brace at the right wrist.      Data Reviewed: I have personally reviewed following labs and imaging studies  CBC: Recent Labs  Lab 05/30/20 0851 06/02/20 1523 06/03/20 1014 06/04/20 0237  WBC 6.2 9.1 8.0 6.4  NEUTROABS 4.6 8.1*  --  5.4  HGB 8.0* 7.3* 7.7* 6.8*  HCT 21.4* 21.5* 22.4* 18.2*  MCV 92.2 99.5 100.9* 100.0  PLT 562* 534* 612* 102*   Basic Metabolic Panel: Recent Labs  Lab 05/30/20 0851 06/02/20 1523 06/03/20 1014 06/04/20 0237  NA 136 138 136 137  K 4.1 4.7 5.0 4.6  CL 101 102 103 105  CO2 25 22 21* 20*  GLUCOSE 117* 111* 96 99  BUN 29* 41* 43* 42*  CREATININE 1.68* 2.01* 2.01* 1.86*  CALCIUM 9.8 9.1 8.7* 8.7*  MG  --   --   --  2.2  PHOS  --   --   --  4.2   GFR: Estimated Creatinine Clearance: 28.3 mL/min (A) (by C-G formula based on SCr of 1.86 mg/dL (H)). Liver Function Tests: Recent Labs  Lab 05/30/20 0851 06/02/20 1523 06/03/20 1014 06/04/20 0237  AST 27 18 21   --   ALT 11 7 10   --   ALKPHOS 113 91 90  --   BILITOT 1.1 0.9 1.3*  --   PROT 9.5* 8.5* 7.9  --   ALBUMIN 2.4* 2.5* 2.3* 2.4*   No results for input(s): LIPASE, AMYLASE in the last 168 hours. No results for input(s): AMMONIA in the last 168 hours. Coagulation Profile: Recent Labs  Lab 06/02/20 1523  INR 1.4*   Cardiac Enzymes: No results for input(s): CKTOTAL, CKMB, CKMBINDEX, TROPONINI in the last 168 hours. BNP (last 3 results) Recent Labs    09/13/19 0938  PROBNP 133.0*   HbA1C: No results for input(s): HGBA1C in the last 72 hours. CBG: No results for input(s): GLUCAP in the last 168 hours. Lipid Profile: No results for input(s): CHOL, HDL, LDLCALC, TRIG, CHOLHDL, LDLDIRECT in the last 72 hours. Thyroid Function Tests: No  results for input(s): TSH, T4TOTAL, FREET4, T3FREE, THYROIDAB in the last 72 hours. Anemia Panel: No results for input(s): VITAMINB12, FOLATE, FERRITIN, TIBC, IRON, RETICCTPCT in the last 72 hours.    Radiology Studies: I have reviewed all of the imaging during this hospital visit personally     Scheduled Meds: . apixaban  2.5 mg Oral BID  . Chlorhexidine Gluconate Cloth  6 each Topical Daily  . fentaNYL  1 patch Transdermal Q72H   Continuous Infusions: . albumin human 25 g (06/04/20 0818)  . azithromycin Stopped (06/03/20 2301)  . cefTRIAXone (ROCEPHIN)  IV Stopped (06/03/20 1054)  . diltiazem (CARDIZEM) infusion 5 mg/hr (06/04/20 0400)     LOS: 0 days        Shemeika Starzyk Gerome Apley, MD

## 2020-06-05 ENCOUNTER — Ambulatory Visit
Admit: 2020-06-05 | Discharge: 2020-06-05 | Disposition: A | Discharge status: Home / Self Care | Payer: Medicare Other | Attending: Radiation Oncology | Admitting: Radiation Oncology

## 2020-06-05 DIAGNOSIS — J189 Pneumonia, unspecified organism: Secondary | ICD-10-CM | POA: Diagnosis not present

## 2020-06-05 DIAGNOSIS — D47Z9 Other specified neoplasms of uncertain behavior of lymphoid, hematopoietic and related tissue: Secondary | ICD-10-CM | POA: Insufficient documentation

## 2020-06-05 DIAGNOSIS — C7951 Secondary malignant neoplasm of bone: Secondary | ICD-10-CM | POA: Insufficient documentation

## 2020-06-05 LAB — CBC WITH DIFFERENTIAL/PLATELET
Abs Immature Granulocytes: 0.17 10*3/uL — ABNORMAL HIGH (ref 0.00–0.07)
Basophils Absolute: 0 10*3/uL (ref 0.0–0.1)
Basophils Relative: 1 %
Eosinophils Absolute: 0.1 10*3/uL (ref 0.0–0.5)
Eosinophils Relative: 4 %
HCT: 22.3 % — ABNORMAL LOW (ref 36.0–46.0)
Hemoglobin: 7.5 g/dL — ABNORMAL LOW (ref 12.0–15.0)
Immature Granulocytes: 5 %
Lymphocytes Relative: 9 %
Lymphs Abs: 0.3 10*3/uL — ABNORMAL LOW (ref 0.7–4.0)
MCH: 31.9 pg (ref 26.0–34.0)
MCHC: 33.6 g/dL (ref 30.0–36.0)
MCV: 94.9 fL (ref 80.0–100.0)
Monocytes Absolute: 0.2 10*3/uL (ref 0.1–1.0)
Monocytes Relative: 7 %
Neutro Abs: 2.3 10*3/uL (ref 1.7–7.7)
Neutrophils Relative %: 74 %
Platelets: 354 10*3/uL (ref 150–400)
RBC: 2.35 MIL/uL — ABNORMAL LOW (ref 3.87–5.11)
RDW: 21.1 % — ABNORMAL HIGH (ref 11.5–15.5)
WBC: 3.2 10*3/uL — ABNORMAL LOW (ref 4.0–10.5)
nRBC: 0 % (ref 0.0–0.2)

## 2020-06-05 LAB — BASIC METABOLIC PANEL
Anion gap: 11 (ref 5–15)
BUN: 48 mg/dL — ABNORMAL HIGH (ref 8–23)
CO2: 21 mmol/L — ABNORMAL LOW (ref 22–32)
Calcium: 8.7 mg/dL — ABNORMAL LOW (ref 8.9–10.3)
Chloride: 105 mmol/L (ref 98–111)
Creatinine, Ser: 1.99 mg/dL — ABNORMAL HIGH (ref 0.44–1.00)
GFR, Estimated: 24 mL/min — ABNORMAL LOW (ref >60)
Glucose, Bld: 105 mg/dL — ABNORMAL HIGH (ref 70–99)
Potassium: 4 mmol/L (ref 3.5–5.1)
Sodium: 137 mmol/L (ref 135–145)

## 2020-06-05 LAB — BPAM RBC
Blood Product Expiration Date: 202112052359
ISSUE DATE / TIME: 202111020607
Unit Type and Rh: 9500

## 2020-06-05 LAB — TYPE AND SCREEN
ABO/RH(D): AB POS
Antibody Screen: POSITIVE
DAT, IgG: NEGATIVE
Unit division: 0

## 2020-06-05 MED ORDER — ADULT MULTIVITAMIN W/MINERALS CH
1.0000 | ORAL_TABLET | Freq: Every day | ORAL | Status: DC
  Administered 2020-06-05 – 2020-06-12 (×6): 1 via ORAL
  Filled 2020-06-05 (×7): qty 1

## 2020-06-05 MED ORDER — ONDANSETRON HCL 4 MG/2ML IJ SOLN
4.0000 mg | Freq: Four times a day (QID) | INTRAMUSCULAR | Status: DC | PRN
  Administered 2020-06-05 – 2020-06-06 (×2): 4 mg via INTRAVENOUS
  Filled 2020-06-05 (×2): qty 2

## 2020-06-05 MED ORDER — GUAIFENESIN-DM 100-10 MG/5ML PO SYRP
5.0000 mL | ORAL_SOLUTION | ORAL | Status: DC | PRN
  Administered 2020-06-07 – 2020-06-08 (×2): 5 mL via ORAL
  Filled 2020-06-05 (×2): qty 10

## 2020-06-05 MED ORDER — FENTANYL 50 MCG/HR TD PT72
1.0000 | MEDICATED_PATCH | TRANSDERMAL | Status: DC
  Administered 2020-06-05: 1 via TRANSDERMAL
  Filled 2020-06-05: qty 1

## 2020-06-05 MED ORDER — HYDROMORPHONE HCL 1 MG/ML IJ SOLN
0.5000 mg | INTRAMUSCULAR | Status: DC | PRN
  Administered 2020-06-05 – 2020-06-06 (×4): 1 mg via INTRAVENOUS
  Filled 2020-06-05 (×6): qty 1

## 2020-06-05 MED ORDER — BOOST PLUS PO LIQD
237.0000 mL | Freq: Three times a day (TID) | ORAL | Status: DC
  Administered 2020-06-05 – 2020-06-12 (×9): 237 mL via ORAL
  Filled 2020-06-05 (×23): qty 237

## 2020-06-05 NOTE — Progress Notes (Signed)
Initial Nutrition Assessment  DOCUMENTATION CODES:   Morbid obesity  INTERVENTION:   -Boost Plus TID- Each supplement provides 360kcal and 14g protein.   -Multivitamin with minerals daily  NUTRITION DIAGNOSIS:   Increased nutrient needs related to cancer and cancer related treatments as evidenced by estimated needs.  GOAL:   Patient will meet greater than or equal to 90% of their needs  MONITOR:   PO intake, Supplement acceptance, Labs, Weight trends, I & O's  REASON FOR ASSESSMENT:   Consult Assessment of nutrition requirement/status  ASSESSMENT:   86 year old female with past medical history for Waledestrom's, non-Hodgkin's lymphoma, known metastatic disease and bone marrow infiltration.  Currently on chemotherapy.  Last cycle 2 days prior to hospitalization.  At home she developed chills, generalized malaise, fatigue, and cough. Admitted to the hospital with a working diagnosis of multifocal pneumonia, complicated with painful bony metastatic lesions and atrial fibrillation with RVR.  Patient in room with RN receiving patient care. Per chart review, pt had been reporting severe pain r/t bony mets. Pt receiving chemotherapy for Waldenstrom NHL, last treatment 10/29. Pt with poor appetite.  Currently consuming 10-25% of meals. Boost Plus supplements have been ordered, will increase to TID given increased needs.   Per weight records, pt has lost 11 lbs since 5/13 (3% wt loss x 5.5 months, insignificant for time frame).   Labs reviewed. Medications: OSCAL-D  NUTRITION - FOCUSED PHYSICAL EXAM:  Will attempt at follow-up  Diet Order:   Diet Order            Diet regular Room service appropriate? Yes; Fluid consistency: Thin  Diet effective now                 EDUCATION NEEDS:   Not appropriate for education at this time  Skin:  Skin Assessment: Skin Integrity Issues: Skin Integrity Issues:: Other (Comment) Other: MASD bilateral buttocks  Last BM:  11/2 -type  6  Height:   Ht Readings from Last 1 Encounters:  06/03/20 5\' 6"  (1.676 m)    Weight:   Wt Readings from Last 1 Encounters:  06/03/20 121.5 kg    BMI:  Body mass index is 43.23 kg/m.  Estimated Nutritional Needs:   Kcal:  2200-2400  Protein:  85-100g  Fluid:  2.2L/day   Clayton Bibles, MS, RD, LDN Inpatient Clinical Dietitian Contact information available via Amion

## 2020-06-05 NOTE — Progress Notes (Addendum)
PROGRESS NOTE    MKAYLA Ellis  VOJ:[id number] DOB: Apr 02, 1931 DOA: 06/02/2020 PCP: Lavone Orn, MD    Brief Narrative:  Natasha Ellis was admitted to the hospital with a working diagnosis of multifocal pneumonia, complicated with painful bony metastatic lesions and atrial fibrillation with RVR.   86 year old female with past medical history for Waledestrom's, non-Hodgkin's lymphoma, known metastatic disease and bone marrow infiltration.  Currently on chemotherapy.  Last cycle 2 days prior to hospitalization.  At home she developed chills, generalized malaise, fatigue, and cough.  On her initial physical examination blood pressure 116/64, heart rate 97, respiratory rate 35, oxygen saturation 92%, moist mucous membranes, lungs with no wheezing or rhonchi, heart S1-S2, present and rhythmic, soft abdomen, no lower extremity edema Sodium 138, potassium 4.7, chloride 102, bicarb 22, glucose 111, BUN 41, creatinine 2.0, white count 9.1, hemoglobin 7.3, hematocrit 21.5, platelets 534.  SARS COVID-19 negative.  Urinalysis specific gravity 1.015, 6-10 white cells.  Chest radiograph with bibasilar infiltrates, more prominent on the right.  CT of the abdomen with bibasilar infiltrates, on the left retrocardiac. Innumerable lytic lesions throughout the pelvic bones and ribs consistent with osseous metastatic disease. Nondisplaced pathologic and not pathologic fractures of the sacrum bilaterally. Pathologic enlarged 6 cm right inguinal lymph node. Cholelithiasis with mild gallbladder wall thickening. New compression fracture L5 vertebral body with approximately 50% height loss.  Old L4 compression fracture.  Patient has been placed on broad-spectrum antibiotic therapy with good toleration.  She continue to have significant pain related to bony metastasis and fractures.   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 Bilateral bibasilar community acquired pneumonia/ present on admission. Complicated with acute hypoxemic respiratory failure Stable oxymetry this am at 94% on 2 L/min per [place]. Wbc is 3,2.   Tolerating well ceftriaxone and azithromycin, plan to complete 5 days.   Continue with incentive spirometer and flutter valve, albuterol as needed.   Severe limited mobility due to multiple pathologic bone fractures and constant pain.   2. 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  Salvage systemic therapy 05/31/20 (#2 cycle).   Persistent pain due to bony lesions.   Plan to increase fentanyl patch to 50 mcg, continue with oxycodone and hydromorphone (increased dose to 1 mg) for break through pain control.   Continue with scheduled acetaminophen and gabapentin (if persistent uncontrolled pain after adjusting fentanyl, will plan to increase dose of gabapentin)   Not able to get out of bed or do physical therapy due to persistent and severe pain.  Plan for palliative radiation to metastatic lesions.   3. AKI on CKD stage 3a.  Stable renal function with serum cr at 1,99, K is 4,0 and bicarbonate is 21.  Continue to encourage po intake. Follow up on renal function in 48 H to limit blood drawns.   4. Chronic anemia of malignancy/ iron deficiency. Sp one unit PRBC, Hgb is 7,5 and Hct at 22.3. Iron 34, tibc 120, transferrin saturation is 28, ferritin 2152, transferrin 86.   Continue with apixaban for anticoagulation.  5. Paroxysmal atrial fibrillation with RVR. Patient transitioned to oral diltiazem (180 mg  daily) with good toleration, her HR this am is 78.  Continue telemetry monitoring and continue anticoagulation with apixaban,   6. COPD. No signs of acute exacerbation, will continue with bronchodilators and inhaled steroids.   Patient continue to be at high risk for worsening pain and recurrent RVR   Status is: Inpatient  Remains inpatient appropriate because:IV treatments appropriate due to intensity of illness or inability to take PO   Dispo: The patient is from: SNF              Anticipated d/c is to: SNF              Anticipated d/c date is: > 3 days              Patient currently is not medically stable to d/c.   DVT prophylaxis: apixaban   Code Status:    DNR   Family Communication:       Consultants:   Oncology   Radiation oncology      Antimicrobials:   Ceftriaxone and azithromycin     Subjective: Patient continue to have significant pain more at the left hip region, she is not able to move due to pain, no nausea or vomiting, no dyspnea.   Objective: Vitals:   06/04/20 1549 06/04/20 2014 06/05/20 0003 06/05/20 0755  BP: (!) 137/55 (!) 120/55 (!) 130/59   Pulse: 79 81 78   Resp: 20 18 (!) 21   Temp: 97.9 F (36.6 C) 97.6 F (36.4 C) 98.3 F (36.8 C)   TempSrc: Oral  Oral   SpO2: 94% 93% 93% 94%  Weight:      Height:        Intake/Output Summary (Last 24 hours) at 06/05/2020 1044 Last data filed at 06/04/2020 1830 Gross per 24 hour  Intake 735 ml  Output 300 ml  Net 435 ml   Filed Weights   06/02/20 1511 06/03/20 0958 06/03/20 2000  Weight: 115.7 kg 121.5 kg 121.5 kg    Examination:   General: in pain, deconditioned and ill looking appearing  Neurology: Awake and alert, non focal  E ENT: mild pallor, no icterus, oral mucosa moist Cardiovascular: No JVD. S1-S2 present, rhythmic, no gallops, rubs, or murmurs. Trace lower extremity edema. Pulmonary: vesicular breath sounds bilaterally, adequate air movement, no wheezing, rhonchi or  rales. Gastrointestinal. Abdomen soft and non tender, protuberant Skin. No rashes Musculoskeletal: no joint deformities     Data Reviewed: I have personally reviewed following labs and imaging studies  CBC: Recent Labs  Lab 05/30/20 0851 06/02/20 1523 06/03/20 1014 06/04/20 0237 06/05/20 0355  WBC 6.2 9.1 8.0 6.4 3.2*  NEUTROABS 4.6 8.1*  --  5.4 2.3  HGB 8.0* 7.3* 7.7* 6.8* 7.5*  HCT 21.4* 21.5* 22.4* 18.2* 22.3*  MCV 92.2 99.5 100.9* 100.0 94.9  PLT 562* 534* 612* 428* 025   Basic Metabolic Panel: Recent Labs  Lab 05/30/20 0851 06/02/20 1523 06/03/20 1014 06/04/20 0237 06/05/20 0355  NA 136 138 136 137 137  K 4.1 4.7 5.0 4.6 4.0  CL 101 102 103 105 105  CO2 25 22 21* 20* 21*  GLUCOSE 117* 111* 96 99 105*  BUN 29* 41* 43* 42* 48*  CREATININE 1.68* 2.01* 2.01* 1.86* 1.99*  CALCIUM 9.8 9.1 8.7* 8.7* 8.7*  MG  --   --   --  2.2  --   PHOS  --   --   --  4.2  --    GFR: Estimated Creatinine Clearance: 26.5 mL/min (A) (by C-G formula based on SCr of 1.99 mg/dL (H)). Liver Function Tests: Recent Labs  Lab 05/30/20 0851 06/02/20 1523 06/03/20 1014 06/04/20 0237  AST 27 18 21   --   ALT 11 7 10   --   ALKPHOS 113 91 90  --   BILITOT 1.1 0.9 1.3*  --   PROT 9.5* 8.5* 7.9  --   ALBUMIN 2.4* 2.5* 2.3* 2.4*   No results for input(s): LIPASE, AMYLASE in the last 168 hours. No results for input(s): AMMONIA in the last 168 hours. Coagulation Profile: Recent Labs  Lab 06/02/20 1523  INR 1.4*   Cardiac Enzymes: No results for input(s): CKTOTAL, CKMB, CKMBINDEX, TROPONINI in the last 168 hours. BNP (last 3 results) Recent Labs    09/13/19 0938  PROBNP 133.0*   HbA1C: No results for input(s): HGBA1C in the last 72 hours. CBG: No results for input(s): GLUCAP in the last 168 hours. Lipid Profile: No results for input(s): CHOL, HDL, LDLCALC, TRIG, CHOLHDL, LDLDIRECT in the last 72 hours. Thyroid Function Tests: No results for input(s): TSH, T4TOTAL, FREET4,  T3FREE, THYROIDAB in the last 72 hours. Anemia Panel: Recent Labs    06/04/20 1023  FERRITIN 2,152*  TIBC 120*  IRON 34      Radiology Studies: I have reviewed all of the imaging during this hospital visit personally     Scheduled Meds: . sodium chloride   Intravenous Once  . acetaminophen  650 mg Oral Q6H  . allopurinol  100 mg Oral BID  . apixaban  2.5 mg Oral BID  . azithromycin  500 mg Oral Daily  . calcium-vitamin D  1 tablet Oral Q breakfast  . Chlorhexidine Gluconate Cloth  6 each Topical Daily  . diltiazem  180 mg Oral Daily  . fentaNYL  1 patch Transdermal Q72H  . fluticasone furoate-vilanterol  1 puff Inhalation Daily  . gabapentin  100 mg Oral TID  . guaiFENesin  600 mg Oral BID  . lactose free nutrition  237 mL Oral BID BM  . pantoprazole  40 mg Oral Daily   Continuous Infusions: . cefTRIAXone (ROCEPHIN)  IV Stopped (06/04/20 1019)     LOS: 1 day        Swan [place] Gerome Apley, MD

## 2020-06-05 NOTE — Progress Notes (Signed)
The patient was seen today and complained of pain trying to get to the simulation table. We reviewed her MAR and rechecked O2 sats that were stable at 96% at 3L [place]. Another dose of .5 mg of dilaudid was going to be given. She starteatd feeling nauseated and vomited. We will try again tomorrow for simulation. And still aim to treat tomorrow as well. If this is not achievable perhaps palliative care evaluation would be recommended.    Carola Rhine, PAC

## 2020-06-05 NOTE — Progress Notes (Addendum)
HEMATOLOGY-ONCOLOGY PROGRESS NOTE  SUBJECTIVE: Continued severe pain left "hip" with movement. Dilaudid provides some relief.   Oncology History  Waldenstrom's macroglobulinemia ([place])  04/19/2014 Initial Diagnosis   Waldenstrom's macroglobulinemia ([place])   11/27/2019 - 12/27/2019 Chemotherapy   The patient had palonosetron (ALOXI) injection 0.25 mg, 0.25 mg, Intravenous,  Once, 2 of 4 cycles Administration: 0.25 mg (11/27/2019), 0.25 mg (12/26/2019) bendamustine (BENDEKA) 175 mg in sodium chloride 0.9 % 50 mL (3.0702 mg/mL) chemo infusion, 70 mg/m2 = 175 mg (100 % of original dose 70 mg/m2), Intravenous,  Once, 2 of 4 cycles Dose modification: 70 mg/m2 (original dose 70 mg/m2, Cycle 1, Reason: Provider Judgment) Administration: 175 mg (11/27/2019), 175 mg (11/28/2019), 175 mg (12/26/2019), 175 mg (12/27/2019) riTUXimab-pvvr (RUXIENCE) 900 mg in sodium chloride 0.9 % 250 mL (2.6471 mg/mL) infusion, 375 mg/m2 = 900 mg, Intravenous,  Once, 2 of 4 cycles Administration: 900 mg (11/27/2019), 900 mg (12/26/2019)  for chemotherapy treatment.    05/09/2020 -  Chemotherapy   The patient had palonosetron (ALOXI) injection 0.25 mg, 0.25 mg, Intravenous,  Once, 2 of 4 cycles Administration: 0.25 mg (05/09/2020), 0.25 mg (05/31/2020) pentostatin (NIPENT) 4.8 mg in sodium chloride 0.9 % 50 mL chemo infusion, 2 mg/m2 = 4.8 mg (100 % of original dose 2 mg/m2), Intravenous,  Once, 2 of 4 cycles Dose modification: 2 mg/m2 (original dose 2 mg/m2, Cycle 1, Reason: Provider Judgment) Administration: 4.8 mg (05/09/2020), 4.8 mg (05/31/2020) cyclophosphamide (CYTOXAN) 920 mg in sodium chloride 0.9 % 250 mL chemo infusion, 400 mg/m2 = 920 mg (100 % of original dose 400 mg/m2), Intravenous,  Once, 2 of 4 cycles Dose modification: 400 mg/m2 (original dose 400 mg/m2, Cycle 1, Reason: Provider Judgment) Administration: 920 mg (05/09/2020), 920 mg (05/31/2020) riTUXimab-pvvr (RUXIENCE) 900 mg in sodium chloride 0.9 % 250 mL (2.6471  mg/mL) infusion, 375 mg/m2 = 900 mg, Intravenous,  Once, 2 of 4 cycles Administration: 900 mg (05/09/2020), 900 mg (05/31/2020)  for chemotherapy treatment.     PHYSICAL EXAMINATION:  Vitals:   06/04/20 2014 06/05/20 0003  BP: (!) 120/55 (!) 130/59  Pulse: 81 78  Resp: 18 (!) 21  Temp: 97.6 F (36.4 C) 98.3 F (36.8 C)  SpO2: 93% 93%   Filed Weights   06/02/20 1511 06/03/20 0958 06/03/20 2000  Weight: 255 lb (115.7 kg) 267 lb 12.8 oz (121.5 kg) 267 lb 13.7 oz (121.5 kg)    Intake/Output from previous day: 11/02 0701 - 11/03 0700 In: 1438.4 [P.O.:180; I.V.:27.6; Blood:1050; IV Piggyback:180.9] Out: 300   OROPHARYNX: No thrush or mucositis. Tongue appears dry LUNGS: Lungs clear anteriorly HEART: regular rate & rhythm  ABDOMEN: abdomen soft, non-tender and normal bowel sounds Vascular: trace edema at bilateral lower leg NEURO: alert & oriented x 3 with fluent speech, no focal motor/sensory deficits Musculoskeletal: Marked tenderness at the left ischium; no pain with internal/external rotation of left hip  LABORATORY DATA:  I have reviewed the data as listed CMP Latest Ref Rng & Units 06/05/2020 06/04/2020 06/03/2020  Glucose 70 - 99 mg/dL 105(H) 99 96  BUN 8 - 23 mg/dL 48(H) 42(H) 43(H)  Creatinine 0.44 - 1.00 mg/dL 1.99(H) 1.86(H) 2.01(H)  Sodium 135 - 145 mmol/L 137 137 136  Potassium 3.5 - 5.1 mmol/L 4.0 4.6 5.0  Chloride 98 - 111 mmol/L 105 105 103  CO2 22 - 32 mmol/L 21(L) 20(L) 21(L)  Calcium 8.9 - 10.3 mg/dL 8.7(L) 8.7(L) 8.7(L)  Total Protein 6.5 - 8.1 g/dL - - 7.9  Total Bilirubin 0.3 -  1.2 mg/dL - - 1.3(H)  Alkaline Phos 38 - 126 U/L - - 90  AST 15 - 41 U/L - - 21  ALT 0 - 44 U/L - - 10    Lab Results  Component Value Date   WBC 3.2 (L) 06/05/2020   HGB 7.5 (L) 06/05/2020   HCT 22.3 (L) 06/05/2020   MCV 94.9 06/05/2020   PLT 354 06/05/2020   NEUTROABS 2.3 06/05/2020    CT Abdomen Pelvis Wo Contrast  Result Date: 06/02/2020 CLINICAL DATA:  Abdominal  pain and fever. EXAM: CT ABDOMEN AND PELVIS WITHOUT CONTRAST TECHNIQUE: Multidetector CT imaging of the abdomen and pelvis was performed following the standard protocol without IV contrast. COMPARISON:  CT dated January 03, 2020 FINDINGS: Lower chest: Multiple airspace opacities are noted at the lung bases bilaterally. There is associated bronchial wall thickening and mucous plugging. The heart size is normal. There are coronary artery calcifications.The heart size is normal. There is a trace left-sided pleural effusion. Hepatobiliary: The liver is normal. There is cholelithiasis with questionable mild gallbladder wall thickening.There is no biliary ductal dilation. Pancreas: Normal contours without ductal dilatation. No peripancreatic fluid collection. Spleen: The spleen is borderline enlarged measuring approximately 12 cm craniocaudad. Adrenals/Urinary Tract: --Adrenal glands: The right adrenal gland is unremarkable. There is a left adrenal myelolipoma that is stable. --Right kidney/ureter: Th there is a stable small exophytic proteinaceous or hemorrhagic cyst arising from the lower pole the right kidney. There is no right-sided hydronephrosis. --Left kidney/ureter: There is no left-sided hydronephrosis. The left kidney is somewhat atrophic with areas of cortical thinning/scarring. --Urinary bladder: Unremarkable. Stomach/Bowel: --Stomach/Duodenum: No hiatal hernia or other gastric abnormality. Normal duodenal course and caliber. --Small bowel: Unremarkable. --Colon: There is scattered colonic diverticula without CT evidence for diverticulitis. There is a large amount of stool throughout the colon. --Appendix: Normal. Vascular/Lymphatic: Atherosclerotic calcification is present within the non-aneurysmal abdominal aorta, without hemodynamically significant stenosis. --No retroperitoneal lymphadenopathy. --No mesenteric lymphadenopathy. --there is a pathologically enlarged right inguinal lymph node measuring  approximately 6 by 3.7 cm (axial series 2, image 71). Reproductive: Status post hysterectomy. No adnexal mass. Other: No ascites or free air. The abdominal wall is normal. Musculoskeletal. There are innumerable lytic lesions throughout the patient's pelvis. The largest is located in the left posterior iliac crest and measures approximately 3.7 cm (axial series 2, image 59). There are small lytic lesions located in the proximal left femur that correspond well with the recent x-ray. There is a stable rounded hyperdense lesion within the marrow cavity of the proximal left femur. This is favored to be benign given its stability. There is a probable nondisplaced zone 1 fracture through the right sacrum (axial series 2, image 54). There is a probable pathologic zone 1 fracture through the left inferior sacrum (axial series 2, image 66). There is transitional vertebral anatomy with 6 lumbar type vertebral bodies. There is an old compression fracture of the L4 vertebral body. There is a new compression fracture of the L5 vertebral body resulting in approximately 50% height loss. There are lytic lesions throughout the visualized ribs. IMPRESSION: 1. Innumerable lytic lesions throughout the pelvic bones and ribs consistent with osseous metastatic disease. 2. Probable non displaced pathologic and nonpathologic fractures of the sacrum bilaterally. 3. Pathologically enlarged 6 cm right inguinal lymph node. This is amenable to percutaneous ultrasound-guided biopsy if clinically indicated. 4. Bibasilar airspace disease concerning for multifocal pneumonia. There is a trace left-sided pleural effusion. 5. Cholelithiasis with mild gallbladder wall thickening. Correlation  with ultrasound is recommended. 6. New compression fracture of the L5 vertebral body with approximately 50% height loss. There is an old L4 compression fracture. There is transitional vertebral anatomy as detailed above. 7. Diverticulosis without CT evidence for  diverticulitis. 8. Borderline splenomegaly Aortic Atherosclerosis (ICD10-I70.0). Electronically Signed   By: [name] M.D.   On: 06/02/2020 20:43   DG Chest Port 1 View  Result Date: 06/02/2020 CLINICAL DATA:  Sepsis. EXAM: PORTABLE CHEST 1 VIEW COMPARISON:  09/13/2019 FINDINGS: Heart size is unremarkable. Aortic calcifications are noted. There is atelectasis at the lung bases. No definite large focal infiltrate. No pneumothorax or pleural effusion. Degenerative changes are noted of both glenohumeral joints. IMPRESSION: No active disease. Electronically Signed   By: [name] M.D.   On: 06/02/2020 16:36   DG Hip Unilat W or W/O Pelvis 1 View Left  Result Date: 05/17/2020 CLINICAL DATA:  History of lymphoma. Hypermetabolic lesion in the left iliac crest on prior PET CT scan. EXAM: DG HIP (WITH OR WITHOUT PELVIS) 1V*L* COMPARISON:  PET CT scan 03/27/2019 and CT abdomen and pelvis 01/03/2020. FINDINGS: This examination is limited as the patient was unable to stand or lie on the table for imaging. There is subtle lucency in the anterior right ilium compatible with the lesion seen on the prior studies. No other abnormality is identified. IMPRESSION: Markedly limited study demonstrating a lucent lesion in the right ilium worrisome for a lymphomatous deposit as seen on the prior studies. Electronically Signed   By: Inge Rise M.D.   On: 05/17/2020 09:17   DG Hip Unilat W or Wo Pelvis 2-3 Views Left  Result Date: 06/02/2020 CLINICAL DATA:  Pain EXAM: DG HIP (WITH OR WITHOUT PELVIS) 2-3V LEFT COMPARISON:  05/15/2020.  CT dated January 03, 2020 FINDINGS: There are degenerative changes of both hips, left worse than right. Evaluation for fractures is limited by patient body habitus. There are increasing lucencies in the proximal left femur when compared to prior studies. IMPRESSION: 1. Evaluation limited by patient body habitus. 2. No definite acute displaced fracture or dislocation. 3.  Increasing lucencies in the proximal left femur raises concern for an underlying lesion. Follow-up with a contrast-enhanced MRI is recommended. Electronically Signed   By: [name] M.D.   On: 06/02/2020 16:35    ASSESSMENT AND PLAN: 1. 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 Treated with multiple systemic therapies including pentostatin/Cytoxan/rituximab in 2010.   Cycle 1 bendamustine/Rituxan 05/31/2014.  Cycle 2 bendamustine/Rituxan 07/02/2014  Cycle 3 bendamustine/rituximab 07/30/2014  Cycle 4 bendamustine/rituximab 09/10/2014  Cycle 5 bendamustine/rituximab 10/22/2014  Cycle 6 bendamustine/rituximab 12/10/2014  PET scan 03/27/2019-diffuse small hypermetabolic lymph nodes in the neck, chest, abdomen, and pelvis, heterogenous hypermetabolism in the bones  CT chest 10/02/2019-bronchiectasis at the lung bases, 13 mm nodular area at the medial left hemidiaphragm, mildly enlarged mediastinal nodes-decreased from June 2020, slightly enlarged left axillary node-9 mm  Rising [name] level March and April 2021  Cycle 1 bendamustine/rituximab 11/27/2019  Cycle 2 bendamustine/Rituxan 12/26/2019  01/22/2020 [name] 4225 (stable)   Cycle 1 cyclophosphamide, pentostatin, rituximab 05/09/2020  Cycle 2 cyclophosphamide, pentostatin, rituximab 05/31/2020  Xray left hip 06/02/2020-no definite acute displaced fracture or disolcation; increasing lucencies in the proximal left femur  CT abdomen and pelvis 06/02/2020-innumerable lytic lesions throughout the pelvic bones and ribs; probable non displaced pathologic and  nonpathologic fractures of the sacrum bilaterally; 6 cm right inguinal lymph node; bilateral airspace disease; new compression fracture L5; borderline splenomegaly 2. Left knee arthritis. Followed by Dr.  Wynelle Link. 3. Sleep apnea. 4. Anemia. microcytic with peripheral blood smear findings consistent with iron deficiency 07/09/2015, serum iron studies consistent with "anemia of chronic disease ", stool Hemoccult cards negative 07/11/2015 5. Question left parotitis 12/12/2013 presenting with left facial and parotid edema. 6. Admission with pneumonia February 2016 7. History of Neutropenia secondary to chemotherapy, now with moderate neutropenia 8. Left lung pneumonia 04/02/2015, sputum culture positive for Moraxella 06/19/2015 9. Low IgG and IgA 10. CT chest 05/13/2015 with bilateral lower lung consolidation 11. CT chest, abdomen, and pelvis 01/08/2016-new peribronchial thickening and peripheral tree in bud pattern at the right lower lobe, no lymphadenopathy 12. Sinus mucosal thickening/opacification on CT 08/16/2015-status post sinus surgery 12/05/2015 13. Sputum culture positive for pseudomonas aeruginosa on 01/10/2016-treated with 10 days of ciprofloxacin 14. 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 Admission with pneumonia 08/26/2016 16. Left lung nodule increased in size on a CT 01/06/2019, not hypermetabolic on the PET scan 8/50/2774, followed by Dr. Elsworth Soho 17.Admission with rectal bleeding 01/02/2020-diverticular? 18.  Pain at the right arm and left iliac-likely related to lymphoma, plain x-ray 05/07/2020 at [facility] revealed destructive changes in the right humerus and radius, x-ray of the pelvis 05/15/2020-subtle lucent lesion in the right ilium 19.  Hospital admission 06/02/2020 -pneumonia, AKI, anemia  Natasha Ellis continues to have severe pain in the region of the left ischium. Plan adjust pain regimen today. We will ask radiation oncology to evaluate.   Hemoglobin up to 7.5 after 1 unit of blood 06/04/2020.  Recommendations: 1.  Increase fentanyl patch to 50 mcg. 2.  Increase dilaudid to .5 to 1 mg every 4 hours as needed, continue oxycodone as needed.   3.   Consult radiation oncology. 4.  Limit blood draws.  PRBC transfusion for hemoglobin less than 7.   LOS: 1 day   Ned Card, AGPCNP-BC 06/05/20 Natasha Ellis was interviewed and examined.  She continues to have severe pain in the left pelvic area.  She is tender at the left ischium.  We reviewed CT images again.  She has a lytic lesion at the left ischium and also at the left iliac.  We will consult radiation oncology to consider palliative radiation.  We adjusted the narcotic pain regimen.  Julieanne Manson, MD

## 2020-06-05 NOTE — Evaluation (Signed)
Physical Therapy Evaluation Patient Details Name: Natasha Ellis MRN: [id number] DOB: May 09, 1931 Today's Date: 06/05/2020   History of Present Illness  Natasha Ellis was admitted to the hospital with a diagnosis of multifocal pneumonia, complicated with painful bony metastatic lesions of sacrum, ribs, L proximal femur,pelvis, L5 compression fracture, and atrial fibrillation with RVR.  Scheduled for radiation for pain management.  Clinical Impression  The patient is not tolerating active mobility at this time. Per MD, patient to have radiation to L eft hip to help with pain. Will reassess patient's mobility as pain control  is improved to allow mobility.  Pt admitted with above diagnosis.  Pt currently with functional limitations due to the deficits listed below (see PT Problem List). Pt will benefit from skilled PT to increase their independence and safety with mobility to allow discharge to the venue listed below.       Follow Up Recommendations SNF    Equipment Recommendations  None recommended by PT    Recommendations for Other Services       Precautions / Restrictions Precautions Precautions: Fall;Other (comment) Precaution Comments: Pain Restrictions Weight Bearing Restrictions: No Other Position/Activity Restrictions: unsure of any WB restrictions with multiple bony lesions, doubt patient can tolerate any WB      Mobility  Bed Mobility               General bed mobility comments: Minimal ability to tolerate any movement due to pain. +2 physical assistance for all bed mobility for patient care. Limited ability to tolerate upright seated position in bed.    Transfers                    Ambulation/Gait                Stairs            Wheelchair Mobility    Modified Rankin (Stroke Patients Only)       Balance                                             Pertinent Vitals/Pain Pain Assessment: 0-10 Pain Score: 10-Worst  pain ever Pain Location: L hip Pain Descriptors / Indicators: Throbbing;Shooting;Stabbing Pain Intervention(s): Monitored during session;Premedicated before session    Home Living Family/patient expects to be discharged to:: Skilled nursing facility                 Additional Comments: in Skilled    Prior Function Level of Independence: Needs assistance;Independent with assistive device(s)   Gait / Transfers Assistance Needed: reports using rollator for ambulation to bathroom. Reports limited mobility secondary to pain.  ADL's / Homemaking Assistance Needed: Assistance with all ADLs.        Hand Dominance   Dominant Hand: Right    Extremity/Trunk Assessment   Upper Extremity Assessment Upper Extremity Assessment: Generalized weakness;RUE deficits/detail;LUE deficits/detail RUE Deficits / Details: grossly functional ROM unable to test strength. Per Oncology Note- plain x-ray 05/07/2020 at [facility] revealed destructive changes in the right humerus and radius. Patietn wearing wrist splint on right - imaging shows arthritic changes in joint. RUE: Unable to fully assess due to pain LUE Deficits / Details: Grossly functional ROM, unable to test strength. LUE: Unable to fully assess due to pain    Lower Extremity Assessment Lower Extremity Assessment: RLE deficits/detail;LLE deficits/detail RLE Deficits /  Details: moves ankle, attempted hip flexion in supine, patient yelled out in pain LLE Deficits / Details: did not attempt to flex the leg  due to pain       Communication   Communication: No difficulties  Cognition Arousal/Alertness: Awake/alert Behavior During Therapy: WFL for tasks assessed/performed;Anxious Overall Cognitive Status: Within Functional Limits for tasks assessed                                 General Comments: Questionable memory.      General Comments      Exercises     Assessment/Plan    PT Assessment Patient needs  continued PT services  PT Problem List Decreased strength;Decreased range of motion;Decreased knowledge of use of DME;Decreased activity tolerance;Decreased safety awareness;Pain;Decreased mobility       PT Treatment Interventions Functional mobility training;Therapeutic activities;Therapeutic exercise;Patient/family education    PT Goals (Current goals can be found in the Care Plan section)  Acute Rehab PT Goals Patient Stated Goal: improve pain PT Goal Formulation: With patient Time For Goal Achievement: 06/19/20 Potential to Achieve Goals: Fair    Frequency Min 2X/week   Barriers to discharge        Co-evaluation PT/OT/SLP Co-Evaluation/Treatment: Yes Reason for Co-Treatment: Complexity of the patient's impairments (multi-system involvement);For patient/therapist safety PT goals addressed during session: Mobility/safety with mobility OT goals addressed during session: ADL's and self-care       AM-PAC PT "6 Clicks" Mobility  Outcome Measure Help needed turning from your back to your side while in a flat bed without using bedrails?: Total Help needed moving from lying on your back to sitting on the side of a flat bed without using bedrails?: Total Help needed moving to and from a bed to a chair (including a wheelchair)?: Total Help needed standing up from a chair using your arms (e.g., wheelchair or bedside chair)?: Total Help needed to walk in hospital room?: Total Help needed climbing 3-5 steps with a railing? : Total 6 Click Score: 6    End of Session   Activity Tolerance: Patient limited by pain Patient left: in bed;with call bell/phone within reach Nurse Communication: Mobility status PT Visit Diagnosis: Pain;Muscle weakness (generalized) (M62.81);Difficulty in walking, not elsewhere classified (R26.2) Pain - Right/Left: Left Pain - part of body: Hip    Time: 1610-9604 PT Time Calculation (min) (ACUTE ONLY): 36 min   Charges:   PT Evaluation $PT Eval Low  Complexity: [place] Pager [phone] Office [phone]   [name] 06/05/2020, 1:32 PM

## 2020-06-05 NOTE — Evaluation (Signed)
Occupational Therapy Evaluation Patient Details Name: Natasha Ellis MRN: [id number] DOB: November 26, 1930 Today's Date: 06/05/2020    History of Present Illness Natasha Ellis was admitted to the hospital with a diagnosis of multifocal pneumonia, complicated with painful bony metastatic lesions and atrial fibrillation with RVR.  Scheduled for radiation for pain management.   Clinical Impression   Natasha Ellis is an 86 year old woman who presents with significant complaints of pain limiting evaluation to bed. Patient unable to tolerate rolling and movement of lower extremities. Patient exhibits gross ROM and strength of upper extremities in order to perform grooming task supine in partially reclined position. Patient is to be receiving radiation to assist with pain control. Patient will benefit from skilled OT services to improve deficits and activity tolerance to improve participation in functional tasks in order to reduce caregiver burden. Will need 24/7 assistance and return to SNF at discharge.    Follow Up Recommendations  SNF    Equipment Recommendations  None recommended by OT    Recommendations for Other Services       Precautions / Restrictions Precautions Precautions: Fall;Other (comment) Precaution Comments: Pain Restrictions Weight Bearing Restrictions: No      Mobility Bed Mobility               General bed mobility comments: Minimal ability to tolerate any movement due to pain. +2 physical assistance for all bed mobility for patient care. Limited ability to tolerate upright seated position in bed.    Transfers                      Balance                                           ADL either performed or assessed with clinical judgement   ADL Overall ADL's : Needs assistance/impaired Eating/Feeding: Set up;Bed level   Grooming: Set up;Bed level;Oral care;Wash/dry face Grooming Details (indicate cue type and reason): Able to wash  face and perform oral care with HOB at 32 degrees. Limited tolerance of upright position due to pain. Upper Body Bathing: Bed level;Maximal assistance   Lower Body Bathing: Total assistance;+2 for physical assistance;+2 for safety/equipment;Bed level   Upper Body Dressing : Maximal assistance;Bed level   Lower Body Dressing: Total assistance;+2 for physical assistance;Bed level     Toilet Transfer Details (indicate cue type and reason): unable Toileting- Clothing Manipulation and Hygiene: Bed level;Total assistance;+2 for physical assistance Toileting - Clothing Manipulation Details (indicate cue type and reason): Requires external catheter.             Vision   Vision Assessment?: No apparent visual deficits     Perception     Praxis      Pertinent Vitals/Pain Pain Assessment: 0-10 Pain Score: 10-Worst pain ever Pain Location: L hip Pain Descriptors / Indicators: Throbbing;Shooting;Stabbing Pain Intervention(s): Premedicated before session;Limited activity within patient's tolerance;Monitored during session;Repositioned     Hand Dominance Right   Extremity/Trunk Assessment Upper Extremity Assessment Upper Extremity Assessment: Generalized weakness;RUE deficits/detail;LUE deficits/detail RUE Deficits / Details: grossly functional ROM unable to test strength. Per Oncology Note- plain x-ray 05/07/2020 at [facility] revealed destructive changes in the right humerus and radius. Patietn wearing wrist splint on right - imaging shows arthritic changes in joint. RUE: Unable to fully assess due to pain LUE Deficits / Details: Grossly functional  ROM, unable to test strength. LUE: Unable to fully assess due to pain   Lower Extremity Assessment Lower Extremity Assessment: Defer to PT evaluation       Communication Communication Communication: No difficulties   Cognition Arousal/Alertness: Awake/alert Behavior During Therapy: WFL for tasks assessed/performed Overall  Cognitive Status: Within Functional Limits for tasks assessed                                 General Comments: Questionable memory.   General Comments       Exercises     Shoulder Instructions      Home Living Family/patient expects to be discharged to:: Assisted living                                        Prior Functioning/Environment Level of Independence: Needs assistance;Independent with assistive device(s)  Gait / Transfers Assistance Needed: reports using rollator for ambulation to bathroom. Reports limited mobility secondary to pain. ADL's / Homemaking Assistance Needed: Assistance with all ADLs.            OT Problem List: Decreased strength;Decreased activity tolerance;Decreased range of motion;Pain;Obesity;Impaired UE functional use      OT Treatment/Interventions: Self-care/ADL training;Therapeutic exercise;DME and/or AE instruction;Therapeutic activities;Patient/family education    OT Goals(Current goals can be found in the care plan section) Acute Rehab OT Goals Patient Stated Goal: improve pain OT Goal Formulation: With patient Time For Goal Achievement: 06/19/20 Potential to Achieve Goals: Fair  OT Frequency: Min 2X/week   Barriers to D/C:            Co-evaluation PT/OT/SLP Co-Evaluation/Treatment: Yes Reason for Co-Treatment: For patient/therapist safety;To address functional/ADL transfers PT goals addressed during session: Mobility/safety with mobility OT goals addressed during session: ADL's and self-care;Strengthening/ROM      AM-PAC OT "6 Clicks" Daily Activity     Outcome Measure Help from another person eating meals?: A Little Help from another person taking care of personal grooming?: A Little Help from another person toileting, which includes using toliet, bedpan, or urinal?: Total Help from another person bathing (including washing, rinsing, drying)?: A Lot Help from another person to put on and taking  off regular upper body clothing?: A Lot Help from another person to put on and taking off regular lower body clothing?: Total 6 Click Score: 12   End of Session Nurse Communication:  (okay to see per RN)  Activity Tolerance: Patient limited by pain Patient left: in bed;with call bell/phone within reach;with bed alarm set  OT Visit Diagnosis: Pain;Muscle weakness (generalized) (M62.81) Pain - Right/Left: Left Pain - part of body: Hip                Time: 9379-0240 OT Time Calculation (min): 27 min Charges:  OT General Charges $OT Visit: 1 Visit OT Evaluation $OT Eval Moderate Complexity: 1 Mod  Krysti Hickling, OTR/L [place]  Office [phone] Pager: [place] 06/05/2020, 12:59 PM

## 2020-06-05 NOTE — Consult Note (Signed)
Radiation Oncology         [phone] ________________________________  Name: Natasha Ellis        MRN: [id number]  Date of Service: 06/05/20 DOB: 1931-04-05  RS:WNIOEVO, Natasha Reichmann, MD    REFERRING PHYSICIAN:   DIAGNOSIS: The primary encounter diagnosis was Waldenstrom's macroglobulinemia [facility]). 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   HISTORY OF PRESENT ILLNESS: Natasha Ellis is a 86 y.o. female seen at the request of Dr. Benay Spice for consideration of palliative radiotherapy to the bones. She has a history of Low Grade B-Cell, Non Hodgkin's Lymphoma originally diagnosed in 2004. She's had multiple intervals of systemic therapy and was without progression between 2016 and the summer of 2021. Her disease has been documented in the mediastinum and axillary nodes, as well as nodes of the neck, abdomen and pelvis and bones. She has been on systemic rituimab and bendamustine beginning in April 2021 ,and her regimen was changed to cyclophosphamide, pentostatin, and rituximab since May 09, 2020, and has received 2 cycles of this with her most recent being given on 05/31/20. She has been having pain in her pelvic region. Her CT of the abdomen and pelvis on 06/02/20 in the ED when she presented showed innumerable lytic lesions in the pelvic bones and probable nondisplaced pathologic and nonpathologic fractures of the sacrum bilaterally as well as new compression fracture at L5. She did have persistent 6 cm right inguinal disease as well. She's contacted today to discuss options of treatment with radiotherapy to palliate her symptoms. She remains hospitalized and is receiving IV pain medication.   PREVIOUS RADIATION THERAPY: No   PAST MEDICAL HISTORY:  Past Medical History:  Diagnosis Date  . Arthritis   . Asthma   . Cancer ([place])    Lymphoma  . Cataract   . Chronic  cough   . Diverticulosis of intestine    H/O  . DJD (degenerative joint disease) of lumbar spine   . IBS (irritable bowel syndrome)   . Left knee DJD   . Malignant lymphoplasmacytic lymphoma ([place]) 2004   Non-Hodgkin Lymphoma  . Right knee DJD   . Sleep apnea        PAST SURGICAL HISTORY: Past Surgical History:  Procedure Laterality Date  . ABDOMINAL HYSTERECTOMY  1984   TAH  . EYE SURGERY Bilateral    with lens replacement  . EYE SURGERY Left    retnia  . KNEE ARTHROSCOPY Left   . NASAL SINUS SURGERY N/A 12/04/2015   Procedure: ENDOSCOPIC SINUS SURGERY;  Surgeon: Izora Gala, MD;  Location: [facility];  Service: ENT;  Laterality: N/A;  . TEE WITHOUT CARDIOVERSION N/A 04/14/2016   Procedure: TRANSESOPHAGEAL ECHOCARDIOGRAM (TEE);  Surgeon: Lelon Perla, MD;  Location: [facility] ENDOSCOPY;  Service: Cardiovascular;  Laterality: N/A;  . TONSILLECTOMY AND ADENOIDECTOMY  childhood     FAMILY HISTORY:  Family History  Problem Relation Age of Onset  . Stroke Father   . Heart failure Mother 100  . Breast cancer Sister      SOCIAL HISTORY:  reports that she quit smoking about 40 years ago. Her smoking use included cigarettes. She has a 25.00 pack-year smoking history. She has never used smokeless tobacco. She reports that she does not drink alcohol and does not use drugs. The patient is widowed and lives in [place].   ALLERGIES: Codeine, Amoxicillin, Cefdinir, Doxycycline, Erythromycin, Lisinopril, Oxybutynin, and Sulfa  antibiotics   MEDICATIONS:  Current Facility-Administered Medications  Medication Dose Route Frequency Provider Last Rate Last Admin  . 0.9 %  sodium chloride infusion (Manually program via Guardrails IV Fluids)   Intravenous Once Arrien, Jimmy Picket, MD      . acetaminophen (TYLENOL) tablet 650 mg  650 mg Oral Q6H Arrien, Jimmy Picket, MD   650 mg at 06/05/20 0732  . albuterol (VENTOLIN HFA) 108 (90 Base) MCG/ACT inhaler 1 puff  1 puff Inhalation Q4H PRN  Arrien, Jimmy Picket, MD      . allopurinol (ZYLOPRIM) tablet 100 mg  100 mg Oral BID Tawni Millers, MD   100 mg at 06/04/20 2136  . apixaban (ELIQUIS) tablet 2.5 mg  2.5 mg Oral BID Jennette Kettle M, DO   2.5 mg at 06/04/20 2136  . azithromycin (ZITHROMAX) tablet 500 mg  500 mg Oral Daily Arrien, Jimmy Picket, MD   500 mg at 06/04/20 1031  . calcium-vitamin D (OSCAL WITH D) 500-200 MG-UNIT per tablet 1 tablet  1 tablet Oral Q breakfast Arrien, Jimmy Picket, MD   1 tablet at 06/05/20 [id number]  . cefTRIAXone (ROCEPHIN) 2 g in sodium chloride 0.9 % 100 mL IVPB  2 g Intravenous Q24H Etta Quill, DO   Stopped at 06/04/20 1019  . Chlorhexidine Gluconate Cloth 2 % PADS 6 each  6 each Topical Daily Bonnell Public, MD   6 each at 06/04/20 0940  . cholestyramine light (PREVALITE) packet 4 g  4 g Oral Daily PRN Arrien, Jimmy Picket, MD      . dextromethorphan (DELSYM) 30 MG/5ML liquid 60 mg  60 mg Oral BID PRN Arrien, Jimmy Picket, MD      . diltiazem (CARDIZEM CD) 24 hr capsule 180 mg  180 mg Oral Daily Arrien, Jimmy Picket, MD      . fentaNYL (DURAGESIC) 50 MCG/HR 1 patch  1 patch Transdermal Q72H Owens Shark, NP      . fluticasone (FLONASE) 50 MCG/ACT nasal spray 1 spray  1 spray Each Nare BID PRN Arrien, Jimmy Picket, MD      . fluticasone furoate-vilanterol (BREO ELLIPTA) 200-25 MCG/INH 1 puff  1 puff Inhalation Daily Tawni Millers, MD   1 puff at 06/05/20 0755  . gabapentin (NEURONTIN) capsule 100 mg  100 mg Oral TID Tawni Millers, MD   100 mg at 06/04/20 2225  . guaiFENesin (MUCINEX) 12 hr tablet 600 mg  600 mg Oral BID Arrien, Jimmy Picket, MD   600 mg at 06/04/20 2136  . hydrocortisone (ANUSOL-HC) 2.5 % rectal cream 1 application  1 application Topical Daily PRN Arrien, Jimmy Picket, MD      . HYDROmorphone (DILAUDID) injection 0.5-1 mg  0.5-1 mg Intravenous Q4H PRN Owens Shark, NP      . lactose free nutrition (BOOST PLUS) liquid  237 mL  237 mL Oral BID BM Arrien, Jimmy Picket, MD      . lip balm (CARMEX) ointment   Topical PRN Dana Allan I, MD      . oxyCODONE (Oxy IR/ROXICODONE) immediate release tablet 5-10 mg  5-10 mg Oral Q4H PRN Maryanna Shape, NP   5 mg at 06/05/20 4128  . pantoprazole (PROTONIX) EC tablet 40 mg  40 mg Oral Daily Arrien, Jimmy Picket, MD      . polyethylene glycol (MIRALAX / GLYCOLAX) packet 17 g  17 g Oral Daily PRN Arrien, Jimmy Picket, MD  REVIEW OF SYSTEMS: On review of systems, the patient has complaints she has verbalized to medical oncology about significant pain in the left hip and SI joint region. Her daughter is her surrogate health care decision maker and the patient requests that I contact her daughter to review options of radiation. Her daughter states she is unwilling to get up and walk much anymore. Her pain is significant and sounds to also be chronic. No other complaints are noted.     PHYSICAL EXAM:  Wt Readings from Last 3 Encounters:  06/03/20 267 lb 13.7 oz (121.5 kg)  05/30/20 258 lb 3.2 oz (117.1 kg)  05/09/20 254 lb 4.8 oz (115.3 kg)   Temp Readings from Last 3 Encounters:  06/05/20 98.3 F (36.8 C) (Oral)  05/31/20 98.2 F (36.8 C) (Oral)  05/30/20 (!) 97.2 F (36.2 C) (Tympanic)   BP Readings from Last 3 Encounters:  06/05/20 (!) 130/59  05/31/20 (!) 114/58  05/30/20 125/65   Pulse Readings from Last 3 Encounters:  06/05/20 78  05/31/20 73  05/30/20 86   Pain Assessment Pain Score: 9 /10  Unable to assess given encounter type.   ECOG = 1  0 - Asymptomatic (Fully active, able to carry on all predisease activities without restriction)  1 - Symptomatic but completely ambulatory (Restricted in physically strenuous activity but ambulatory and able to carry out work of a light or sedentary nature. For example, light housework, office work)  2 - Symptomatic, <50% in bed during the day (Ambulatory and capable of all self care  but unable to carry out any work activities. Up and about more than 50% of waking hours)  3 - Symptomatic, >50% in bed, but not bedbound (Capable of only limited self-care, confined to bed or chair 50% or more of waking hours)  4 - Bedbound (Completely disabled. Cannot carry on any self-care. Totally confined to bed or chair)  5 - Death   Eustace Pen MM, Creech RH, Tormey DC, et al. [phone]). "Toxicity and response criteria of the [facility] Group". [place] Oncol. 5 (6): 649-55    LABORATORY DATA:  Lab Results  Component Value Date   WBC 3.2 (L) 06/05/2020   HGB 7.5 (L) 06/05/2020   HCT 22.3 (L) 06/05/2020   MCV 94.9 06/05/2020   PLT 354 06/05/2020   Lab Results  Component Value Date   NA 137 06/05/2020   K 4.0 06/05/2020   CL 105 06/05/2020   CO2 21 (L) 06/05/2020   Lab Results  Component Value Date   ALT 10 06/03/2020   AST 21 06/03/2020   ALKPHOS 90 06/03/2020   BILITOT 1.3 (H) 06/03/2020      RADIOGRAPHY: CT Abdomen Pelvis Wo Contrast  Result Date: 06/02/2020 CLINICAL DATA:  Abdominal pain and fever. EXAM: CT ABDOMEN AND PELVIS WITHOUT CONTRAST TECHNIQUE: Multidetector CT imaging of the abdomen and pelvis was performed following the standard protocol without IV contrast. COMPARISON:  CT dated January 03, 2020 FINDINGS: Lower chest: Multiple airspace opacities are noted at the lung bases bilaterally. There is associated bronchial wall thickening and mucous plugging. The heart size is normal. There are coronary artery calcifications.The heart size is normal. There is a trace left-sided pleural effusion. Hepatobiliary: The liver is normal. There is cholelithiasis with questionable mild gallbladder wall thickening.There is no biliary ductal dilation. Pancreas: Normal contours without ductal dilatation. No peripancreatic fluid collection. Spleen: The spleen is borderline enlarged measuring approximately 12 cm craniocaudad. Adrenals/Urinary Tract: --Adrenal glands: The  right adrenal gland is unremarkable. There is a left adrenal myelolipoma that is stable. --Right kidney/ureter: Th there is a stable small exophytic proteinaceous or hemorrhagic cyst arising from the lower pole the right kidney. There is no right-sided hydronephrosis. --Left kidney/ureter: There is no left-sided hydronephrosis. The left kidney is somewhat atrophic with areas of cortical thinning/scarring. --Urinary bladder: Unremarkable. Stomach/Bowel: --Stomach/Duodenum: No hiatal hernia or other gastric abnormality. Normal duodenal course and caliber. --Small bowel: Unremarkable. --Colon: There is scattered colonic diverticula without CT evidence for diverticulitis. There is a large amount of stool throughout the colon. --Appendix: Normal. Vascular/Lymphatic: Atherosclerotic calcification is present within the non-aneurysmal abdominal aorta, without hemodynamically significant stenosis. --No retroperitoneal lymphadenopathy. --No mesenteric lymphadenopathy. --there is a pathologically enlarged right inguinal lymph node measuring approximately 6 by 3.7 cm (axial series 2, image 71). Reproductive: Status post hysterectomy. No adnexal mass. Other: No ascites or free air. The abdominal wall is normal. Musculoskeletal. There are innumerable lytic lesions throughout the patient's pelvis. The largest is located in the left posterior iliac crest and measures approximately 3.7 cm (axial series 2, image 59). There are small lytic lesions located in the proximal left femur that correspond well with the recent x-ray. There is a stable rounded hyperdense lesion within the marrow cavity of the proximal left femur. This is favored to be benign given its stability. There is a probable nondisplaced zone 1 fracture through the right sacrum (axial series 2, image 54). There is a probable pathologic zone 1 fracture through the left inferior sacrum (axial series 2, image 66). There is transitional vertebral anatomy with 6 lumbar type  vertebral bodies. There is an old compression fracture of the L4 vertebral body. There is a new compression fracture of the L5 vertebral body resulting in approximately 50% height loss. There are lytic lesions throughout the visualized ribs. IMPRESSION: 1. Innumerable lytic lesions throughout the pelvic bones and ribs consistent with osseous metastatic disease. 2. Probable non displaced pathologic and nonpathologic fractures of the sacrum bilaterally. 3. Pathologically enlarged 6 cm right inguinal lymph node. This is amenable to percutaneous ultrasound-guided biopsy if clinically indicated. 4. Bibasilar airspace disease concerning for multifocal pneumonia. There is a trace left-sided pleural effusion. 5. Cholelithiasis with mild gallbladder wall thickening. Correlation with ultrasound is recommended. 6. New compression fracture of the L5 vertebral body with approximately 50% height loss. There is an old L4 compression fracture. There is transitional vertebral anatomy as detailed above. 7. Diverticulosis without CT evidence for diverticulitis. 8. Borderline splenomegaly Aortic Atherosclerosis (ICD10-I70.0). Electronically Signed   By: [name] M.D.   On: 06/02/2020 20:43   DG Chest Port 1 View  Result Date: 06/02/2020 CLINICAL DATA:  Sepsis. EXAM: PORTABLE CHEST 1 VIEW COMPARISON:  09/13/2019 FINDINGS: Heart size is unremarkable. Aortic calcifications are noted. There is atelectasis at the lung bases. No definite large focal infiltrate. No pneumothorax or pleural effusion. Degenerative changes are noted of both glenohumeral joints. IMPRESSION: No active disease. Electronically Signed   By: [name] M.D.   On: 06/02/2020 16:36   DG Hip Unilat W or W/O Pelvis 1 View Left  Result Date: 05/17/2020 CLINICAL DATA:  History of lymphoma. Hypermetabolic lesion in the left iliac crest on prior PET CT scan. EXAM: DG HIP (WITH OR WITHOUT PELVIS) 1V*L* COMPARISON:  PET CT scan 03/27/2019 and CT  abdomen and pelvis 01/03/2020. FINDINGS: This examination is limited as the patient was unable to stand or lie on the table for imaging. There is subtle lucency in the  anterior right ilium compatible with the lesion seen on the prior studies. No other abnormality is identified. IMPRESSION: Markedly limited study demonstrating a lucent lesion in the right ilium worrisome for a lymphomatous deposit as seen on the prior studies. Electronically Signed   By: Inge Rise M.D.   On: 05/17/2020 09:17   DG Hip Unilat W or Wo Pelvis 2-3 Views Left  Result Date: 06/02/2020 CLINICAL DATA:  Pain EXAM: DG HIP (WITH OR WITHOUT PELVIS) 2-3V LEFT COMPARISON:  05/15/2020.  CT dated January 03, 2020 FINDINGS: There are degenerative changes of both hips, left worse than right. Evaluation for fractures is limited by patient body habitus. There are increasing lucencies in the proximal left femur when compared to prior studies. IMPRESSION: 1. Evaluation limited by patient body habitus. 2. No definite acute displaced fracture or dislocation. 3. Increasing lucencies in the proximal left femur raises concern for an underlying lesion. Follow-up with a contrast-enhanced MRI is recommended. Electronically Signed   By: [name] M.D.   On: 06/02/2020 16:35       IMPRESSION/PLAN: 1. Low Grade B-cell Non-Hodgkin's Lymphoma involving the bones. Dr. Lisbeth Renshaw has reviewed her case and would like to offer consideration of palliative radiotherapy to painful sites. I spent time with the patient's surrogate health care decision maker, her daughter, at the patient's request. We discussed the risks, benefits, short, and long term effects of radiotherapy, and the patient's daughter is interested in proceeding and gives verbal consent to proceed. Dr. Lisbeth Renshaw recommends a course of 5 fractions of radiotherapy. We will plan to simulate later today and to begin radiotherapy tomorrow.    In a visit lasting 60 minutes, greater than 50% of the  time was spent by phone and in floor time discussing the patient's condition, in preparation for the discussion, and coordinating the patient's care.     Carola Rhine, PAC

## 2020-06-06 ENCOUNTER — Ambulatory Visit
Admit: 2020-06-06 | Discharge: 2020-06-06 | Disposition: A | Discharge status: Home / Self Care | Payer: Medicare Other | Attending: Radiation Oncology | Admitting: Radiation Oncology

## 2020-06-06 DIAGNOSIS — J189 Pneumonia, unspecified organism: Secondary | ICD-10-CM | POA: Diagnosis not present

## 2020-06-06 MED ORDER — OXYCODONE HCL 5 MG PO TABS
10.0000 mg | ORAL_TABLET | ORAL | Status: DC | PRN
  Administered 2020-06-06 – 2020-06-09 (×5): 10 mg via ORAL
  Filled 2020-06-06 (×6): qty 2

## 2020-06-06 MED ORDER — LORAZEPAM 0.5 MG PO TABS
0.5000 mg | ORAL_TABLET | Freq: Every day | ORAL | Status: DC | PRN
  Administered 2020-06-07 – 2020-06-12 (×4): 0.5 mg via ORAL
  Filled 2020-06-06 (×5): qty 1

## 2020-06-06 MED ORDER — LORAZEPAM 2 MG/ML IJ SOLN
1.0000 mg | Freq: Once | INTRAMUSCULAR | Status: AC
  Administered 2020-06-06: 1 mg via INTRAVENOUS
  Filled 2020-06-06: qty 1

## 2020-06-06 MED ORDER — GABAPENTIN 100 MG PO CAPS
200.0000 mg | ORAL_CAPSULE | Freq: Three times a day (TID) | ORAL | Status: DC
  Administered 2020-06-06 – 2020-06-10 (×12): 200 mg via ORAL
  Filled 2020-06-06 (×12): qty 2

## 2020-06-06 MED ORDER — HYDROMORPHONE HCL 1 MG/ML IJ SOLN
1.0000 mg | INTRAMUSCULAR | Status: DC | PRN
  Administered 2020-06-06 – 2020-06-07 (×4): 1 mg via INTRAVENOUS
  Filled 2020-06-06 (×4): qty 1

## 2020-06-06 NOTE — Progress Notes (Signed)
PROGRESS NOTE    Natasha Ellis  KVQ:[id number] DOB: 17-Sep-1930 DOA: 06/02/2020 PCP: Lavone Orn, MD    Brief Narrative:  Mrs. Geathers was admitted to the hospital with a working diagnosis of multifocal pneumonia, complicated with painful bony metastatic lesions and atrial fibrillation with RVR.   86 year old female with past medical history for Waledestrom's,non-Hodgkin's lymphoma, known metastatic disease and bone marrow infiltration. Currently on chemotherapy. Last cycle 2 days prior to hospitalization. At home she developed chills, generalized malaise, fatigue, and cough. On her initial physical examination blood pressure 116/64, heart rate 97, respiratory rate 35, oxygen saturation 92%, moist mucous membranes, lungs with no wheezing or rhonchi, heart S1-S2, present and rhythmic, soft abdomen, no lower extremity edema Sodium 138, potassium 4.7, chloride 102, bicarb 22, glucose 111, BUN 41, creatinine 2.0, white count 9.1, hemoglobin 7.3, hematocrit 21.5, platelets 534. SARS COVID-19 negative. Urinalysis specific gravity 1.015, 6-10 white cells.  Chest radiograph with bibasilar infiltrates, more prominent on the right. CT of the abdomen with bibasilar infiltrates, on the left retrocardiac. Innumerable lytic lesions throughout the pelvic bones and ribs consistent with osseous metastatic disease. Nondisplaced pathologic and not pathologic fractures of the sacrum bilaterally. Pathologic enlarged 6 cm right inguinal lymph node. Cholelithiasis with mild gallbladder wall thickening. New compression fracture L5 vertebral body with approximately 50% height loss. Old L4 compression fracture.  Patient has been placed on broad-spectrum antibiotic therapy with good toleration. She continue to havesignificant pain related to bony metastasis and fractures.  Plan for palliative radiation to bony mets to her hip. Pain has been very severe and has limited her ability to move.    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 Bilateral bibasilar community acquired pneumonia/ present on admission. Complicated with acute hypoxemic respiratory failure   Continue antibiotic therapy with ceftriaxone and azithromycin #4/5. Encourage incentive spirometer and flutter valve, continue with albuterol as needed.   Continue to have severe limited mobility due to multiple pathologic bone fractures and constant pain.   2. 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  Salvage systemic therapy 05/31/20 (#2 cycle).   Persistent pain due to bony lesions, likely related to lymphoma.   She is on fentanyl patch to 50 mcg, dose was increased yesterday. Will increase dose of oxycodone IR to 10 mg as needed every 4 H and hydromorphone to 1 mg as needed every 2 H.  On scheduled acetaminophen, increase gabapentin to 200 mg po tid.  Today had palliative radiation to bony lesion at the left hip.   3. AKI on CKD stage 3a.  follow up on renal function in am, continue gentle hydration with IV fluids. Avoid hypotension and nephrotoxic medications.  4. Chronic anemia of malignancy/ iron deficiency. Sp 1 unit PRBC,. Iron 34, tibc 120, transferrin saturation is 28, ferritin 2152, transferrin 86.   5. Paroxysmal atrial fibrillation with RVR. Atrial fibrillation continue rate controlled with oral diltiazem 180 mg daily, continue anticoagulation with apixaban,  Will discontinue telemetry monitoring for now.   6. COPD. No clinical signs of acute  exacerbation, on bronchodilators  and inhaled steroids  Status is: Inpatient  Remains inpatient appropriate because:IV treatments appropriate due to intensity of illness or inability to take PO   Dispo: The patient is from: SNF              Anticipated d/c is to: SNF              Anticipated d/c date is: 3 days              Patient currently is not medically stable to d/c.   DVT prophylaxis: apixaban   Code Status:   dnr   Family Communication:  I spoke over the phone with the patient's daughter about patient's  condition, plan of care, prognosis and all questions were addressed.   Nutrition Status: Nutrition Problem: Increased nutrient needs Etiology: cancer and cancer related treatments Signs/Symptoms: estimated needs Interventions: Boost Plus, MVI     Consultants:   Oncology   Radiation oncology    Antimicrobials:   Ceftriaxone and azithromycin     Subjective: Patient this am had radiation therapy, her pain has not been well controlled, no nausea or vomiting. For procedure she received lorazepam and hydromorphone.  At the time of my examination she is somnolent, most information from nursing at the bedside   Objective: Vitals:   06/05/20 0755 06/05/20 1413 06/05/20 2015 06/06/20 0330  BP:  138/61 (!) 137/57 (!) 103/44  Pulse:  85 84 65  Resp:  16 15 15   Temp:  (!) 97.5 F (36.4 C) 98.2 F (36.8 C) 97.9 F (36.6 C)  TempSrc:  Oral Oral Oral  SpO2: 94% (!) 88% (!) 88% 96%  Weight:      Height:        Intake/Output Summary (Last 24 hours) at 06/06/2020 1211 Last data filed at 06/06/2020 0730 Gross per 24 hour  Intake 240 ml  Output --  Net 240 ml   Filed Weights   06/02/20 1511 06/03/20 0958 06/03/20 2000  Weight: 115.7 kg 121.5 kg 121.5 kg    Examination:   General: deconditioned and ill looking appearing  Neurology: somnolent but easy to arouse.  E ENT: mild pallor, no icterus, oral mucosa moist Cardiovascular: No JVD. S1-S2 present, no  gallops. No lower extremity edema. Pulmonary: positive breath sounds bilaterally, with no wheezing, rhonchi or rales. Gastrointestinal. Abdomen soft and non tender Skin. No rashes Musculoskeletal: no joint deformities     Data Reviewed: I have personally reviewed following labs and imaging studies  CBC: Recent Labs  Lab 06/02/20 1523 06/03/20 1014 06/04/20 0237 06/05/20 0355  WBC 9.1 8.0 6.4 3.2*  NEUTROABS 8.1*  --  5.4 2.3  HGB 7.3* 7.7* 6.8* 7.5*  HCT 21.5* 22.4* 18.2* 22.3*  MCV 99.5 100.9* 100.0 94.9  PLT 534* 612* 428* 426   Basic Metabolic Panel: Recent Labs  Lab 06/02/20 1523 06/03/20 1014 06/04/20 0237 06/05/20 0355  NA 138 136 137 137  K 4.7 5.0 4.6 4.0  CL 102 103 105 105  CO2 22 21* 20* 21*  GLUCOSE 111* 96 99 105*  BUN 41* 43* 42* 48*  CREATININE 2.01* 2.01* 1.86* 1.99*  CALCIUM 9.1 8.7* 8.7* 8.7*  MG  --   --  2.2  --   PHOS  --   --  4.2  --    GFR: Estimated Creatinine Clearance: 26.5 mL/min (A) (by C-G formula based on SCr of 1.99 mg/dL (H)). Liver Function Tests: Recent Labs  Lab 06/02/20 1523 06/03/20 1014 06/04/20 0237  AST 18 21  --   ALT 7 10  --   ALKPHOS 91 90  --   BILITOT 0.9 1.3*  --   PROT 8.5* 7.9  --   ALBUMIN 2.5* 2.3* 2.4*   No results for input(s): LIPASE, AMYLASE in the last 168 hours. No results for input(s): AMMONIA in the last 168 hours. Coagulation Profile: Recent Labs  Lab 06/02/20 1523  INR 1.4*   Cardiac Enzymes: No results for input(s): CKTOTAL, CKMB, CKMBINDEX, TROPONINI in the last 168 hours. BNP (last 3 results) Recent Labs    09/13/19 0938  PROBNP 133.0*   HbA1C: No results for input(s): HGBA1C in the last 72 hours. CBG: No results for input(s): GLUCAP in the last 168 hours. Lipid Profile: No results for input(s): CHOL, HDL, LDLCALC, TRIG, CHOLHDL, LDLDIRECT in the last 72 hours. Thyroid Function Tests: No results for input(s): TSH, T4TOTAL, FREET4, T3FREE, THYROIDAB in the last 72  hours. Anemia Panel: Recent Labs    06/04/20 1023  FERRITIN 2,152*  TIBC 120*  IRON 34      Radiology Studies: I have reviewed all of the imaging during this hospital visit personally     Scheduled Meds: . sodium chloride   Intravenous Once  . acetaminophen  650 mg Oral Q6H  . allopurinol  100 mg Oral BID  . apixaban  2.5 mg Oral BID  . azithromycin  500 mg Oral Daily  . calcium-vitamin D  1 tablet Oral Q breakfast  . Chlorhexidine Gluconate Cloth  6 each Topical Daily  . diltiazem  180 mg Oral Daily  . fentaNYL  1 patch Transdermal Q72H  . fluticasone furoate-vilanterol  1 puff Inhalation Daily  . gabapentin  100 mg Oral TID  . lactose free nutrition  237 mL Oral TID BM  . multivitamin with minerals  1 tablet Oral Daily  . pantoprazole  40 mg Oral Daily   Continuous Infusions: . cefTRIAXone (ROCEPHIN)  IV 2 g (06/05/20 1127)     LOS: 2 days        Lydia Meng Gerome Apley, MD

## 2020-06-06 NOTE — Progress Notes (Addendum)
HEMATOLOGY-ONCOLOGY PROGRESS NOTE  SUBJECTIVE: The patient just returned from radiation.  She is sleeping after receiving pain medication and Ativan.  Nursing reports that she was yelling out in pain prior to going to radiation.  Pain was worse with movement.  Oncology History  Waldenstrom's macroglobulinemia ([facility])  04/19/2014 Initial Diagnosis   Waldenstrom's macroglobulinemia ([place])   11/27/2019 - 12/27/2019 Chemotherapy   The patient had palonosetron (ALOXI) injection 0.25 mg, 0.25 mg, Intravenous,  Once, 2 of 4 cycles Administration: 0.25 mg (11/27/2019), 0.25 mg (12/26/2019) bendamustine (BENDEKA) 175 mg in sodium chloride 0.9 % 50 mL (3.0702 mg/mL) chemo infusion, 70 mg/m2 = 175 mg (100 % of original dose 70 mg/m2), Intravenous,  Once, 2 of 4 cycles Dose modification: 70 mg/m2 (original dose 70 mg/m2, Cycle 1, Reason: Provider Judgment) Administration: 175 mg (11/27/2019), 175 mg (11/28/2019), 175 mg (12/26/2019), 175 mg (12/27/2019) riTUXimab-pvvr (RUXIENCE) 900 mg in sodium chloride 0.9 % 250 mL (2.6471 mg/mL) infusion, 375 mg/m2 = 900 mg, Intravenous,  Once, 2 of 4 cycles Administration: 900 mg (11/27/2019), 900 mg (12/26/2019)  for chemotherapy treatment.    05/09/2020 -  Chemotherapy   The patient had palonosetron (ALOXI) injection 0.25 mg, 0.25 mg, Intravenous,  Once, 2 of 4 cycles Administration: 0.25 mg (05/09/2020), 0.25 mg (05/31/2020) pentostatin (NIPENT) 4.8 mg in sodium chloride 0.9 % 50 mL chemo infusion, 2 mg/m2 = 4.8 mg (100 % of original dose 2 mg/m2), Intravenous,  Once, 2 of 4 cycles Dose modification: 2 mg/m2 (original dose 2 mg/m2, Cycle 1, Reason: Provider Judgment) Administration: 4.8 mg (05/09/2020), 4.8 mg (05/31/2020) cyclophosphamide (CYTOXAN) 920 mg in sodium chloride 0.9 % 250 mL chemo infusion, 400 mg/m2 = 920 mg (100 % of original dose 400 mg/m2), Intravenous,  Once, 2 of 4 cycles Dose modification: 400 mg/m2 (original dose 400 mg/m2, Cycle 1, Reason: Provider  Judgment) Administration: 920 mg (05/09/2020), 920 mg (05/31/2020) riTUXimab-pvvr (RUXIENCE) 900 mg in sodium chloride 0.9 % 250 mL (2.6471 mg/mL) infusion, 375 mg/m2 = 900 mg, Intravenous,  Once, 2 of 4 cycles Administration: 900 mg (05/09/2020), 900 mg (05/31/2020)  for chemotherapy treatment.     PHYSICAL EXAMINATION:  Vitals:   06/05/20 2015 06/06/20 0330  BP: (!) 137/57 (!) 103/44  Pulse: 84 65  Resp: 15 15  Temp: 98.2 F (36.8 C) 97.9 F (36.6 C)  SpO2: (!) 88% 96%   Filed Weights   06/02/20 1511 06/03/20 0958 06/03/20 2000  Weight: 115.7 kg 121.5 kg 121.5 kg    Intake/Output from previous day: 11/03 0701 - 11/04 0700 In: 240 [P.O.:240] Out: -   OROPHARYNX: No thrush or mucositis.  LUNGS: Lungs clear anteriorly HEART: regular rate & rhythm  ABDOMEN: abdomen soft, non-tender and normal bowel sounds Vascular: trace edema at bilateral lower leg NEURO: alert & oriented x 3 with fluent speech, no focal motor/sensory deficits Musculoskeletal: Marked tenderness at the left ischium; no pain with internal/external rotation of left hip  LABORATORY DATA:  I have reviewed the data as listed CMP Latest Ref Rng & Units 06/05/2020 06/04/2020 06/03/2020  Glucose 70 - 99 mg/dL 105(H) 99 96  BUN 8 - 23 mg/dL 48(H) 42(H) 43(H)  Creatinine 0.44 - 1.00 mg/dL 1.99(H) 1.86(H) 2.01(H)  Sodium 135 - 145 mmol/L 137 137 136  Potassium 3.5 - 5.1 mmol/L 4.0 4.6 5.0  Chloride 98 - 111 mmol/L 105 105 103  CO2 22 - 32 mmol/L 21(L) 20(L) 21(L)  Calcium 8.9 - 10.3 mg/dL 8.7(L) 8.7(L) 8.7(L)  Total Protein 6.5 -  8.1 g/dL - - 7.9  Total Bilirubin 0.3 - 1.2 mg/dL - - 1.3(H)  Alkaline Phos 38 - 126 U/L - - 90  AST 15 - 41 U/L - - 21  ALT 0 - 44 U/L - - 10    Lab Results  Component Value Date   WBC 3.2 (L) 06/05/2020   HGB 7.5 (L) 06/05/2020   HCT 22.3 (L) 06/05/2020   MCV 94.9 06/05/2020   PLT 354 06/05/2020   NEUTROABS 2.3 06/05/2020    CT Abdomen Pelvis Wo Contrast  Result Date:  06/02/2020 CLINICAL DATA:  Abdominal pain and fever. EXAM: CT ABDOMEN AND PELVIS WITHOUT CONTRAST TECHNIQUE: Multidetector CT imaging of the abdomen and pelvis was performed following the standard protocol without IV contrast. COMPARISON:  CT dated January 03, 2020 FINDINGS: Lower chest: Multiple airspace opacities are noted at the lung bases bilaterally. There is associated bronchial wall thickening and mucous plugging. The heart size is normal. There are coronary artery calcifications.The heart size is normal. There is a trace left-sided pleural effusion. Hepatobiliary: The liver is normal. There is cholelithiasis with questionable mild gallbladder wall thickening.There is no biliary ductal dilation. Pancreas: Normal contours without ductal dilatation. No peripancreatic fluid collection. Spleen: The spleen is borderline enlarged measuring approximately 12 cm craniocaudad. Adrenals/Urinary Tract: --Adrenal glands: The right adrenal gland is unremarkable. There is a left adrenal myelolipoma that is stable. --Right kidney/ureter: Th there is a stable small exophytic proteinaceous or hemorrhagic cyst arising from the lower pole the right kidney. There is no right-sided hydronephrosis. --Left kidney/ureter: There is no left-sided hydronephrosis. The left kidney is somewhat atrophic with areas of cortical thinning/scarring. --Urinary bladder: Unremarkable. Stomach/Bowel: --Stomach/Duodenum: No hiatal hernia or other gastric abnormality. Normal duodenal course and caliber. --Small bowel: Unremarkable. --Colon: There is scattered colonic diverticula without CT evidence for diverticulitis. There is a large amount of stool throughout the colon. --Appendix: Normal. Vascular/Lymphatic: Atherosclerotic calcification is present within the non-aneurysmal abdominal aorta, without hemodynamically significant stenosis. --No retroperitoneal lymphadenopathy. --No mesenteric lymphadenopathy. --there is a pathologically enlarged right  inguinal lymph node measuring approximately 6 by 3.7 cm (axial series 2, image 71). Reproductive: Status post hysterectomy. No adnexal mass. Other: No ascites or free air. The abdominal wall is normal. Musculoskeletal. There are innumerable lytic lesions throughout the patient's pelvis. The largest is located in the left posterior iliac crest and measures approximately 3.7 cm (axial series 2, image 59). There are small lytic lesions located in the proximal left femur that correspond well with the recent x-ray. There is a stable rounded hyperdense lesion within the marrow cavity of the proximal left femur. This is favored to be benign given its stability. There is a probable nondisplaced zone 1 fracture through the right sacrum (axial series 2, image 54). There is a probable pathologic zone 1 fracture through the left inferior sacrum (axial series 2, image 66). There is transitional vertebral anatomy with 6 lumbar type vertebral bodies. There is an old compression fracture of the L4 vertebral body. There is a new compression fracture of the L5 vertebral body resulting in approximately 50% height loss. There are lytic lesions throughout the visualized ribs. IMPRESSION: 1. Innumerable lytic lesions throughout the pelvic bones and ribs consistent with osseous metastatic disease. 2. Probable non displaced pathologic and nonpathologic fractures of the sacrum bilaterally. 3. Pathologically enlarged 6 cm right inguinal lymph node. This is amenable to percutaneous ultrasound-guided biopsy if clinically indicated. 4. Bibasilar airspace disease concerning for multifocal pneumonia. There is a trace left-sided  pleural effusion. 5. Cholelithiasis with mild gallbladder wall thickening. Correlation with ultrasound is recommended. 6. New compression fracture of the L5 vertebral body with approximately 50% height loss. There is an old L4 compression fracture. There is transitional vertebral anatomy as detailed above. 7.  Diverticulosis without CT evidence for diverticulitis. 8. Borderline splenomegaly Aortic Atherosclerosis (ICD10-I70.0). Electronically Signed   By: [name] M.D.   On: 06/02/2020 20:43   DG Chest Port 1 View  Result Date: 06/02/2020 CLINICAL DATA:  Sepsis. EXAM: PORTABLE CHEST 1 VIEW COMPARISON:  09/13/2019 FINDINGS: Heart size is unremarkable. Aortic calcifications are noted. There is atelectasis at the lung bases. No definite large focal infiltrate. No pneumothorax or pleural effusion. Degenerative changes are noted of both glenohumeral joints. IMPRESSION: No active disease. Electronically Signed   By: [name] M.D.   On: 06/02/2020 16:36   DG Hip Unilat W or W/O Pelvis 1 View Left  Result Date: 05/17/2020 CLINICAL DATA:  History of lymphoma. Hypermetabolic lesion in the left iliac crest on prior PET CT scan. EXAM: DG HIP (WITH OR WITHOUT PELVIS) 1V*L* COMPARISON:  PET CT scan 03/27/2019 and CT abdomen and pelvis 01/03/2020. FINDINGS: This examination is limited as the patient was unable to stand or lie on the table for imaging. There is subtle lucency in the anterior right ilium compatible with the lesion seen on the prior studies. No other abnormality is identified. IMPRESSION: Markedly limited study demonstrating a lucent lesion in the right ilium worrisome for a lymphomatous deposit as seen on the prior studies. Electronically Signed   By: Inge Rise M.D.   On: 05/17/2020 09:17   DG Hip Unilat W or Wo Pelvis 2-3 Views Left  Result Date: 06/02/2020 CLINICAL DATA:  Pain EXAM: DG HIP (WITH OR WITHOUT PELVIS) 2-3V LEFT COMPARISON:  05/15/2020.  CT dated January 03, 2020 FINDINGS: There are degenerative changes of both hips, left worse than right. Evaluation for fractures is limited by patient body habitus. There are increasing lucencies in the proximal left femur when compared to prior studies. IMPRESSION: 1. Evaluation limited by patient body habitus. 2. No definite acute  displaced fracture or dislocation. 3. Increasing lucencies in the proximal left femur raises concern for an underlying lesion. Follow-up with a contrast-enhanced MRI is recommended. Electronically Signed   By: [name] M.D.   On: 06/02/2020 16:35    ASSESSMENT AND PLAN: 1. 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 Treated with multiple systemic therapies including pentostatin/Cytoxan/rituximab in 2010.   Cycle 1 bendamustine/Rituxan 05/31/2014.  Cycle 2 bendamustine/Rituxan 07/02/2014  Cycle 3 bendamustine/rituximab 07/30/2014  Cycle 4 bendamustine/rituximab 09/10/2014  Cycle 5 bendamustine/rituximab 10/22/2014  Cycle 6 bendamustine/rituximab 12/10/2014  PET scan 03/27/2019-diffuse small hypermetabolic lymph nodes in the neck, chest, abdomen, and pelvis, heterogenous hypermetabolism in the bones  CT chest 10/02/2019-bronchiectasis at the lung bases, 13 mm nodular area at the medial left hemidiaphragm, mildly enlarged mediastinal nodes-decreased from June 2020, slightly enlarged left axillary node-9 mm  Rising [name] level March and April 2021  Cycle 1 bendamustine/rituximab 11/27/2019  Cycle 2 bendamustine/Rituxan 12/26/2019  01/22/2020 [name] 4225 (stable)   Cycle 1 cyclophosphamide, pentostatin, rituximab 05/09/2020  Cycle 2 cyclophosphamide, pentostatin, rituximab 05/31/2020  Xray left hip 06/02/2020-no definite acute displaced fracture or disolcation; increasing lucencies in the proximal left femur  CT abdomen and pelvis 06/02/2020-innumerable lytic lesions throughout  the pelvic bones and ribs; probable non displaced pathologic and nonpathologic fractures of the sacrum bilaterally; 6 cm right inguinal lymph node; bilateral airspace disease; new compression fracture L5; borderline  splenomegaly 2. Left knee arthritis. Followed by Dr. Wynelle Link. 3. Sleep apnea. 4. Anemia. microcytic with peripheral blood smear findings consistent with iron deficiency 07/09/2015, serum iron studies consistent with "anemia of chronic disease ", stool Hemoccult cards negative 07/11/2015 5. Question left parotitis 12/12/2013 presenting with left facial and parotid edema. 6. Admission with pneumonia February 2016 7. History of Neutropenia secondary to chemotherapy, now with moderate neutropenia 8. Left lung pneumonia 04/02/2015, sputum culture positive for Moraxella 06/19/2015 9. Low IgG and IgA 10. CT chest 05/13/2015 with bilateral lower lung consolidation 11. CT chest, abdomen, and pelvis 01/08/2016-new peribronchial thickening and peripheral tree in bud pattern at the right lower lobe, no lymphadenopathy 12. Sinus mucosal thickening/opacification on CT 08/16/2015-status post sinus surgery 12/05/2015 13. Sputum culture positive for pseudomonas aeruginosa on 01/10/2016-treated with 10 days of ciprofloxacin 14. 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 Admission with pneumonia 08/26/2016 16. Left lung nodule increased in size on a CT 01/06/2019, not hypermetabolic on the PET scan 2/50/0370, followed by Dr. Elsworth Soho 17.Admission with rectal bleeding 01/02/2020-diverticular? 18.  Pain at the right arm and left iliac-likely related to lymphoma, plain x-ray 05/07/2020 at [facility] revealed destructive changes in the right humerus and radius, x-ray of the pelvis 05/15/2020-subtle lucent lesion in the right ilium 19.  Hospital admission 06/02/2020 -pneumonia, AKI, anemia  Ms. Enge appears stable.  She continues to have pain to her left ischium.  Pain controlled with current as needed pain medications.  Started palliative radiation this morning.     Received 1 unit PRBCs on 06/04/2020 and hemoglobin was up to 7.5 yesterday morning.  Repeat labs not obtained  today.  Recommendations: 1.  Continue fentanyl patch at 50 mcg/h. 2.  Continue as needed Dilaudid and oxycodone. 3.  Continue palliative radiation under the care of Dr. Lisbeth Renshaw. 4.  Limit blood draws.  PRBC transfusion for hemoglobin less than 7.   LOS: 2 days   Mikey Bussing 06/06/20 I saw Ms. Hallums early this morning.  She complained of severe pain at the left "hip ".  She was unable to complete radiation simulation yesterday secondary to pain.  She was given Dilaudid and Ativan prior to radiation simulation today. She is now at day 7 following cycle 2 cyclophosphamide, pentostatin, and rituximab for treatment of progressive WaldenStrom's macroglobulinemia.  We will check a CBC tomorrow.

## 2020-06-07 ENCOUNTER — Ambulatory Visit
Admit: 2020-06-07 | Discharge: 2020-06-07 | Disposition: A | Discharge status: Home / Self Care | Payer: Medicare Other | Attending: Radiation Oncology | Admitting: Radiation Oncology

## 2020-06-07 DIAGNOSIS — J157 Pneumonia due to Mycoplasma pneumoniae: Secondary | ICD-10-CM

## 2020-06-07 DIAGNOSIS — J189 Pneumonia, unspecified organism: Secondary | ICD-10-CM | POA: Diagnosis not present

## 2020-06-07 LAB — CBC WITH DIFFERENTIAL/PLATELET
Abs Immature Granulocytes: 0.04 10*3/uL (ref 0.00–0.07)
Basophils Absolute: 0.1 10*3/uL (ref 0.0–0.1)
Basophils Relative: 3 %
Eosinophils Absolute: 0.2 10*3/uL (ref 0.0–0.5)
Eosinophils Relative: 8 %
HCT: 23.5 % — ABNORMAL LOW (ref 36.0–46.0)
Hemoglobin: 8.1 g/dL — ABNORMAL LOW (ref 12.0–15.0)
Immature Granulocytes: 2 %
Lymphocytes Relative: 32 %
Lymphs Abs: 0.6 10*3/uL — ABNORMAL LOW (ref 0.7–4.0)
MCH: 33.6 pg (ref 26.0–34.0)
MCHC: 34.5 g/dL (ref 30.0–36.0)
MCV: 97.5 fL (ref 80.0–100.0)
Monocytes Absolute: 0.3 10*3/uL (ref 0.1–1.0)
Monocytes Relative: 14 %
Neutro Abs: 0.8 10*3/uL — ABNORMAL LOW (ref 1.7–7.7)
Neutrophils Relative %: 41 %
Platelets: 347 10*3/uL (ref 150–400)
RBC: 2.41 MIL/uL — ABNORMAL LOW (ref 3.87–5.11)
RDW: 22.6 % — ABNORMAL HIGH (ref 11.5–15.5)
WBC: 2 10*3/uL — ABNORMAL LOW (ref 4.0–10.5)
nRBC: 0 % (ref 0.0–0.2)

## 2020-06-07 LAB — CULTURE, BLOOD (ROUTINE X 2)
Culture: NO GROWTH
Special Requests: ADEQUATE

## 2020-06-07 MED ORDER — HYDROMORPHONE HCL 1 MG/ML IJ SOLN
1.0000 mg | INTRAMUSCULAR | Status: DC | PRN
  Administered 2020-06-07 – 2020-06-12 (×13): 1 mg via INTRAVENOUS
  Filled 2020-06-07 (×13): qty 1

## 2020-06-07 MED ORDER — METOPROLOL TARTRATE 5 MG/5ML IV SOLN
5.0000 mg | Freq: Once | INTRAVENOUS | Status: AC
  Administered 2020-06-07: 5 mg via INTRAVENOUS
  Filled 2020-06-07: qty 5

## 2020-06-07 NOTE — Progress Notes (Signed)
PROGRESS NOTE    Natasha Ellis  TXM:[id number] DOB: [birth year] DOA: 06/02/2020 PCP: Lavone Orn, MD    Brief Narrative:  Natasha Ellis was admitted to the hospital with a working diagnosis of multifocal pneumonia, complicated with painful bony metastatic lesions and atrial fibrillation with RVR.  86 year old female with past medical history for Natasha Ellis,non-Hodgkin's lymphoma, known metastatic disease and bone marrow infiltration. Currently on chemotherapy. Last cycle 2 days prior to hospitalization. At home she developed chills, generalized malaise, fatigue, and cough. On her initial physical examination blood pressure 116/64, heart rate 97, respiratory rate 35, oxygen saturation 92%, moist mucous membranes, lungs with no wheezing or rhonchi, heart S1-S2, present and rhythmic, soft abdomen, no lower extremity edema Sodium 138, potassium 4.7, chloride 102, bicarb 22, glucose 111, BUN 41, creatinine 2.0, white count 9.1, hemoglobin 7.3, hematocrit 21.5, platelets 534. SARS COVID-19 negative. Urinalysis specific gravity 1.015, 6-10 white cells.  Chest radiograph with bibasilar infiltrates, more prominent on the right. CT of the abdomen with bibasilar infiltrates, on the left retrocardiac. Innumerable lytic lesions throughout the pelvic bones and ribs consistent with osseous metastatic disease. Nondisplaced pathologic and not pathologic fractures of the sacrum bilaterally. Pathologic enlarged 6 cm right inguinal lymph node. Cholelithiasis with mild gallbladder wall thickening. New compression fracture L5 vertebral body with approximately 50% height loss. Old L4 compression fracture.  Patient has been placed on broad-spectrum antibiotic therapy with good toleration. She continue to havesignificant pain related to bony metastasis and fractures.  Plan for palliative radiation to bony mets to her hip. Pain has been very severe and has limited her ability to move.       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 Bilateral bibasilar community acquired pneumonia/ present on admission.Complicated with acute hypoxemic respiratory failure  Patient has completed antibiotic therapy today with ceftriaxone and azithromycin #5/5. Continue to encourageincentive spirometer and flutter valve, continue with albuterol as needed.   She continue to have severelimited mobility due to multiple pathologic bone fracturesand constant pain.  2. 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  Salvage systemic therapy 05/31/20 (#2 cycle). Lytic bony lesions, likely related to lymphoma. Currently getting palliative radiation therapy for pain control.   Continue with fentanyl patch to28mcg, as needed oxycodone IR to 10 mg hydromorphone to 1 mg as needed every 2 H.  Continue withscheduled acetaminophen, and gabapentin to 200 mg po tid.   3. AKI on CKD stage 3a. Check renal panel and electrolytes in am.  4. Chronic anemia of malignancy/ iron deficiency.Sp 1 unit PRBC,. Iron 34, tibc 120, transferrin saturation is 28, ferritin 2152, transferrin 86.  Follow up Hgb is 8,1 and Hct at 23.5/   5. Paroxysmal atrial fibrillation with RVR. Patient had episode of atrial fibrillation with RVR, required IV metoprolol. Now converted to sinus rhythm, continue rate control with diltiazem 180 mg daily and anticoagulation with apixaban,  Continue with telemetry for now.   6. COPD. No signs of clinical acute  exacerbation, continue with  bronchodilators and inhaled steroids    Patient continue to be at high  risk for recurrent AFib   Status is: Inpatient  Remains inpatient appropriate because:IV treatments appropriate due to intensity of illness or inability to take PO   Dispo: The patient is from: SNF              Anticipated d/c is to: SNF              Anticipated d/c date is: 3 days              Patient currently is not medically stable to d/c.   DVT prophylaxis: apixaban   Code Status:   dnr   Family Communication:  Not family at the bedside      Nutrition Status: Nutrition Problem: Increased nutrient needs Etiology: cancer and cancer related treatments Signs/Symptoms: estimated needs Interventions: Boost Plus, MVI      Consultants:   Oncology   Radiation oncology      Antimicrobials:   Completed azithromycin and ceftriaxone     Subjective: Patient continue to have severe pain at the right hip worse with movement, at the time of my examination patient had recently IV hydromorphone and is comfortable in bed. Positive somnolence.   Objective: Vitals:   06/07/20 0406 06/07/20 0728 06/07/20 0950 06/07/20 1000  BP: 134/74  110/82 112/80  Pulse: 89 70 (!) 125 (!) 110  Resp: 18 18 20    Temp: 98.6 F (37 C)   98.2 F (36.8 C)  TempSrc: Oral     SpO2: 94% 94%  95%  Weight:      Height:        Intake/Output Summary (Last 24 hours) at 06/07/2020 1449 Last data filed at 06/07/2020 0957 Gross per 24 hour  Intake 557.41 ml  Output --  Net 557.41 ml   Filed Weights   06/02/20 1511 06/03/20 0958 06/03/20 2000  Weight: 115.7 kg 121.5 kg 121.5 kg    Examination:   General: Not in pain or dyspnea, deconditioned  Neurology: Awake and alert, non focal  E ENT: mild pallor, no icterus, oral mucosa dryt Cardiovascular: No JVD. S1-S2 present, rhythmic,No lower extremity edema. Pulmonary: vesicular breath sounds bilaterally, adequate air movement, no wheezing,  rhonchi or rales. Gastrointestinal. Abdomen soft and non tender Skin. No rashes Musculoskeletal: no joint deformities     Data Reviewed: I have personally reviewed following labs and imaging studies  CBC: Recent Labs  Lab 06/02/20 1523 06/03/20 1014 06/04/20 0237 06/05/20 0355 06/07/20 0347  WBC 9.1 8.0 6.4 3.2* 2.0*  NEUTROABS 8.1*  --  5.4 2.3 0.8*  HGB 7.3* 7.7* 6.8* 7.5* 8.1*  HCT 21.5* 22.4* 18.2* 22.3* 23.5*  MCV 99.5 100.9* 100.0 94.9 97.5  PLT 534* 612* 428* 354 315   Basic Metabolic Panel: Recent Labs  Lab 06/02/20 1523 06/03/20 1014 06/04/20 0237 06/05/20 0355  NA 138 136 137 137  K 4.7 5.0 4.6 4.0  CL 102 103 105 105  CO2 22 21* 20* 21*  GLUCOSE 111* 96 99 105*  BUN 41* 43* 42* 48*  CREATININE 2.01* 2.01* 1.86* 1.99*  CALCIUM 9.1 8.7* 8.7* 8.7*  MG  --   --  2.2  --   PHOS  --   --  4.2  --    GFR: Estimated Creatinine Clearance: 26.5 mL/min (A) (by C-G formula based on SCr of 1.99 mg/dL (H)). Liver Function Tests: Recent Labs  Lab 06/02/20 1523 06/03/20 1014 06/04/20 0237  AST 18 21  --   ALT 7 10  --   ALKPHOS  91 90  --   BILITOT 0.9 1.3*  --   PROT 8.5* 7.9  --   ALBUMIN 2.5* 2.3* 2.4*   No results for input(s): LIPASE, AMYLASE in the last 168 hours. No results for input(s): AMMONIA in the last 168 hours. Coagulation Profile: Recent Labs  Lab 06/02/20 1523  INR 1.4*   Cardiac Enzymes: No results for input(s): CKTOTAL, CKMB, CKMBINDEX, TROPONINI in the last 168 hours. BNP (last 3 results) Recent Labs    09/13/19 0938  PROBNP 133.0*   HbA1C: No results for input(s): HGBA1C in the last 72 hours. CBG: No results for input(s): GLUCAP in the last 168 hours. Lipid Profile: No results for input(s): CHOL, HDL, LDLCALC, TRIG, CHOLHDL, LDLDIRECT in the last 72 hours. Thyroid Function Tests: No results for input(s): TSH, T4TOTAL, FREET4, T3FREE, THYROIDAB in the last 72 hours. Anemia Panel: No results for input(s): VITAMINB12,  FOLATE, FERRITIN, TIBC, IRON, RETICCTPCT in the last 72 hours.    Radiology Studies: I have reviewed all of the imaging during this hospital visit personally     Scheduled Meds: . sodium chloride   Intravenous Once  . acetaminophen  650 mg Oral Q6H  . allopurinol  100 mg Oral BID  . apixaban  2.5 mg Oral BID  . calcium-vitamin D  1 tablet Oral Q breakfast  . Chlorhexidine Gluconate Cloth  6 each Topical Daily  . diltiazem  180 mg Oral Daily  . fentaNYL  1 patch Transdermal Q72H  . fluticasone furoate-vilanterol  1 puff Inhalation Daily  . gabapentin  200 mg Oral TID  . lactose free nutrition  237 mL Oral TID BM  . multivitamin with minerals  1 tablet Oral Daily  . pantoprazole  40 mg Oral Daily   Continuous Infusions:   LOS: 3 days        Kel Senn Gerome Apley, MD

## 2020-06-07 NOTE — Progress Notes (Signed)
PT Cancellation Note  Patient Details Name: Natasha Ellis MRN: [id number] DOB: 01/24/1931   Cancelled Treatment:    Reason Eval/Treat Not Completed: Pain limiting ability to participate, patient gfrequently heard moaning /yelling in pain. Will check back 11/8 for improved pain control.   [name] 06/07/2020, 1:59 PM [place] Pager [phone] Office [phone]

## 2020-06-07 NOTE — Progress Notes (Addendum)
HEMATOLOGY-ONCOLOGY PROGRESS NOTE  SUBJECTIVE: The patient received pain medication recently after personal care.  Nursing reports that she yells out in pain with any movement.  Heart rate elevated this morning and nursing is giving a dose of metoprolol.  Oncology History  Waldenstrom's macroglobulinemia (Natasha Ellis)  04/19/2014 Initial Diagnosis   Waldenstrom's macroglobulinemia (Natasha Ellis)   11/27/2019 - 12/27/2019 Chemotherapy   The patient had palonosetron (ALOXI) injection 0.25 mg, 0.25 mg, Intravenous,  Once, 2 of 4 cycles Administration: 0.25 mg (11/27/2019), 0.25 mg (12/26/2019) bendamustine (BENDEKA) 175 mg in sodium chloride 0.9 % 50 mL (3.0702 mg/mL) chemo infusion, 70 mg/m2 = 175 mg (100 % of original dose 70 mg/m2), Intravenous,  Once, 2 of 4 cycles Dose modification: 70 mg/m2 (original dose 70 mg/m2, Cycle 1, Reason: Provider Judgment) Administration: 175 mg (11/27/2019), 175 mg (11/28/2019), 175 mg (12/26/2019), 175 mg (12/27/2019) riTUXimab-pvvr (RUXIENCE) 900 mg in sodium chloride 0.9 % 250 mL (2.6471 mg/mL) infusion, 375 mg/m2 = 900 mg, Intravenous,  Once, 2 of 4 cycles Administration: 900 mg (11/27/2019), 900 mg (12/26/2019)  for chemotherapy treatment.    05/09/2020 -  Chemotherapy   The patient had palonosetron (ALOXI) injection 0.25 mg, 0.25 mg, Intravenous,  Once, 2 of 4 cycles Administration: 0.25 mg (05/09/2020), 0.25 mg (05/31/2020) pentostatin (NIPENT) 4.8 mg in sodium chloride 0.9 % 50 mL chemo infusion, 2 mg/m2 = 4.8 mg (100 % of original dose 2 mg/m2), Intravenous,  Once, 2 of 4 cycles Dose modification: 2 mg/m2 (original dose 2 mg/m2, Cycle 1, Reason: Provider Judgment) Administration: 4.8 mg (05/09/2020), 4.8 mg (05/31/2020) cyclophosphamide (CYTOXAN) 920 mg in sodium chloride 0.9 % 250 mL chemo infusion, 400 mg/m2 = 920 mg (100 % of original dose 400 mg/m2), Intravenous,  Once, 2 of 4 cycles Dose modification: 400 mg/m2 (original dose 400 mg/m2, Cycle 1, Reason: Provider  Judgment) Administration: 920 mg (05/09/2020), 920 mg (05/31/2020) riTUXimab-pvvr (RUXIENCE) 900 mg in sodium chloride 0.9 % 250 mL (2.6471 mg/mL) infusion, 375 mg/m2 = 900 mg, Intravenous,  Once, 2 of 4 cycles Administration: 900 mg (05/09/2020), 900 mg (05/31/2020)  for chemotherapy treatment.     PHYSICAL EXAMINATION:  Vitals:   06/07/20 0406 06/07/20 0728  BP: 134/74   Pulse: 89 70  Resp: 18 18  Temp: 98.6 F (37 C)   SpO2: 94% 94%   Filed Weights   06/02/20 1511 06/03/20 0958 06/03/20 2000  Weight: 115.7 kg 121.5 kg 121.5 kg    Intake/Output from previous day: 11/04 0701 - 11/05 0700 In: 557.4 [P.O.:360; IV Piggyback:197.4] Out: -   OROPHARYNX: No thrush or mucositis.  LUNGS: Lungs clear anteriorly HEART: regular rate & rhythm  ABDOMEN: abdomen soft, non-tender and normal bowel sounds Vascular: trace edema at bilateral lower leg NEURO: alert & oriented x 3 with fluent speech, no focal motor/sensory deficits Musculoskeletal: Marked tenderness at the left ischium; no pain with internal/external rotation of left hip  LABORATORY DATA:  I have reviewed the data as listed CMP Latest Ref Rng & Units 06/05/2020 06/04/2020 06/03/2020  Glucose 70 - 99 mg/dL 105(H) 99 96  BUN 8 - 23 mg/dL 48(H) 42(H) 43(H)  Creatinine 0.44 - 1.00 mg/dL 1.99(H) 1.86(H) 2.01(H)  Sodium 135 - 145 mmol/L 137 137 136  Potassium 3.5 - 5.1 mmol/L 4.0 4.6 5.0  Chloride 98 - 111 mmol/L 105 105 103  CO2 22 - 32 mmol/L 21(L) 20(L) 21(L)  Calcium 8.9 - 10.3 mg/dL 8.7(L) 8.7(L) 8.7(L)  Total Protein 6.5 - 8.1 g/dL - - 7.9  Total Bilirubin 0.3 - 1.2 mg/dL - - 1.3(H)  Alkaline Phos 38 - 126 U/L - - 90  AST 15 - 41 U/L - - 21  ALT 0 - 44 U/L - - 10    Lab Results  Component Value Date   WBC 2.0 (L) 06/07/2020   HGB 8.1 (L) 06/07/2020   HCT 23.5 (L) 06/07/2020   MCV 97.5 06/07/2020   PLT 347 06/07/2020   NEUTROABS 0.8 (L) 06/07/2020    CT Abdomen Pelvis Wo Contrast  Result Date:  06/02/2020 CLINICAL DATA:  Abdominal pain and fever. EXAM: CT ABDOMEN AND PELVIS WITHOUT CONTRAST TECHNIQUE: Multidetector CT imaging of the abdomen and pelvis was performed following the standard protocol without IV contrast. COMPARISON:  CT dated January 03, 2020 FINDINGS: Lower chest: Multiple airspace opacities are noted at the lung bases bilaterally. There is associated bronchial wall thickening and mucous plugging. The heart size is normal. There are coronary artery calcifications.The heart size is normal. There is a trace left-sided pleural effusion. Hepatobiliary: The liver is normal. There is cholelithiasis with questionable mild gallbladder wall thickening.There is no biliary ductal dilation. Pancreas: Normal contours without ductal dilatation. No peripancreatic fluid collection. Spleen: The spleen is borderline enlarged measuring approximately 12 cm craniocaudad. Adrenals/Urinary Tract: --Adrenal glands: The right adrenal gland is unremarkable. There is a left adrenal myelolipoma that is stable. --Right kidney/ureter: Th there is a stable small exophytic proteinaceous or hemorrhagic cyst arising from the lower pole the right kidney. There is no right-sided hydronephrosis. --Left kidney/ureter: There is no left-sided hydronephrosis. The left kidney is somewhat atrophic with areas of cortical thinning/scarring. --Urinary bladder: Unremarkable. Stomach/Bowel: --Stomach/Duodenum: No hiatal hernia or other gastric abnormality. Normal duodenal course and caliber. --Small bowel: Unremarkable. --Colon: There is scattered colonic diverticula without CT evidence for diverticulitis. There is a large amount of stool throughout the colon. --Appendix: Normal. Vascular/Lymphatic: Atherosclerotic calcification is present within the non-aneurysmal abdominal aorta, without hemodynamically significant stenosis. --No retroperitoneal lymphadenopathy. --No mesenteric lymphadenopathy. --there is a pathologically enlarged right  inguinal lymph node measuring approximately 6 by 3.7 cm (axial series 2, image 71). Reproductive: Status post hysterectomy. No adnexal mass. Other: No ascites or free air. The abdominal wall is normal. Musculoskeletal. There are innumerable lytic lesions throughout the patient's pelvis. The largest is located in the left posterior iliac crest and measures approximately 3.7 cm (axial series 2, image 59). There are small lytic lesions located in the proximal left femur that correspond well with the recent x-ray. There is a stable rounded hyperdense lesion within the marrow cavity of the proximal left femur. This is favored to be benign given its stability. There is a probable nondisplaced zone 1 fracture through the right sacrum (axial series 2, image 54). There is a probable pathologic zone 1 fracture through the left inferior sacrum (axial series 2, image 66). There is transitional vertebral anatomy with 6 lumbar type vertebral bodies. There is an old compression fracture of the L4 vertebral body. There is a new compression fracture of the L5 vertebral body resulting in approximately 50% height loss. There are lytic lesions throughout the visualized ribs. IMPRESSION: 1. Innumerable lytic lesions throughout the pelvic bones and ribs consistent with osseous metastatic disease. 2. Probable non displaced pathologic and nonpathologic fractures of the sacrum bilaterally. 3. Pathologically enlarged 6 cm right inguinal lymph node. This is amenable to percutaneous ultrasound-guided biopsy if clinically indicated. 4. Bibasilar airspace disease concerning for multifocal pneumonia. There is a trace left-sided pleural effusion. 5. Cholelithiasis with  mild gallbladder wall thickening. Correlation with ultrasound is recommended. 6. New compression fracture of the L5 vertebral body with approximately 50% height loss. There is an old L4 compression fracture. There is transitional vertebral anatomy as detailed above. 7.  Diverticulosis without CT evidence for diverticulitis. 8. Borderline splenomegaly Aortic Atherosclerosis (ICD10-I70.0). Electronically Signed   By: [name] M.D.   On: 06/02/2020 20:43   DG Chest Port 1 View  Result Date: 06/02/2020 CLINICAL DATA:  Sepsis. EXAM: PORTABLE CHEST 1 VIEW COMPARISON:  09/13/2019 FINDINGS: Heart size is unremarkable. Aortic calcifications are noted. There is atelectasis at the lung bases. No definite large focal infiltrate. No pneumothorax or pleural effusion. Degenerative changes are noted of both glenohumeral joints. IMPRESSION: No active disease. Electronically Signed   By: [name] M.D.   On: 06/02/2020 16:36   DG Hip Unilat W or W/O Pelvis 1 View Left  Result Date: 05/17/2020 CLINICAL DATA:  History of lymphoma. Hypermetabolic lesion in the left iliac crest on prior PET CT scan. EXAM: DG HIP (WITH OR WITHOUT PELVIS) 1V*L* COMPARISON:  PET CT scan 03/27/2019 and CT abdomen and pelvis 01/03/2020. FINDINGS: This examination is limited as the patient was unable to stand or lie on the table for imaging. There is subtle lucency in the anterior right ilium compatible with the lesion seen on the prior studies. No other abnormality is identified. IMPRESSION: Markedly limited study demonstrating a lucent lesion in the right ilium worrisome for a lymphomatous deposit as seen on the prior studies. Electronically Signed   By: Inge Rise M.D.   On: 05/17/2020 09:17   DG Hip Unilat W or Wo Pelvis 2-3 Views Left  Result Date: 06/02/2020 CLINICAL DATA:  Pain EXAM: DG HIP (WITH OR WITHOUT PELVIS) 2-3V LEFT COMPARISON:  05/15/2020.  CT dated January 03, 2020 FINDINGS: There are degenerative changes of both hips, left worse than right. Evaluation for fractures is limited by patient body habitus. There are increasing lucencies in the proximal left femur when compared to prior studies. IMPRESSION: 1. Evaluation limited by patient body habitus. 2. No definite acute  displaced fracture or dislocation. 3. Increasing lucencies in the proximal left femur raises concern for an underlying lesion. Follow-up with a contrast-enhanced MRI is recommended. Electronically Signed   By: [name] M.D.   On: 06/02/2020 16:35    ASSESSMENT AND PLAN: 1. 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 Treated with multiple systemic therapies including pentostatin/Cytoxan/rituximab in 2010.   Cycle 1 bendamustine/Rituxan 05/31/2014.  Cycle 2 bendamustine/Rituxan 07/02/2014  Cycle 3 bendamustine/rituximab 07/30/2014  Cycle 4 bendamustine/rituximab 09/10/2014  Cycle 5 bendamustine/rituximab 10/22/2014  Cycle 6 bendamustine/rituximab 12/10/2014  PET scan 03/27/2019-diffuse small hypermetabolic lymph nodes in the neck, chest, abdomen, and pelvis, heterogenous hypermetabolism in the bones  CT chest 10/02/2019-bronchiectasis at the lung bases, 13 mm nodular area at the medial left hemidiaphragm, mildly enlarged mediastinal nodes-decreased from June 2020, slightly enlarged left axillary node-9 mm  Rising [name] level March and April 2021  Cycle 1 bendamustine/rituximab 11/27/2019  Cycle 2 bendamustine/Rituxan 12/26/2019  01/22/2020 [name] 4225 (stable)   Cycle 1 cyclophosphamide, pentostatin, rituximab 05/09/2020  Cycle 2 cyclophosphamide, pentostatin, rituximab 05/31/2020  Xray left hip 06/02/2020-no definite acute displaced fracture or disolcation; increasing lucencies in the proximal left femur  CT abdomen and pelvis 06/02/2020-innumerable lytic lesions throughout the pelvic bones and ribs;  probable non displaced pathologic and nonpathologic fractures of the sacrum bilaterally; 6 cm right inguinal lymph node; bilateral airspace disease; new compression fracture L5; borderline  splenomegaly 2. Left knee arthritis. Followed by Dr. Wynelle Link. 3. Sleep apnea. 4. Anemia. microcytic with peripheral blood smear findings consistent with iron deficiency 07/09/2015, serum iron studies consistent with "anemia of chronic disease ", stool Hemoccult cards negative 07/11/2015 5. Question left parotitis 12/12/2013 presenting with left facial and parotid edema. 6. Admission with pneumonia February 2016 7. History of Neutropenia secondary to chemotherapy, now with moderate neutropenia 8. Left lung pneumonia 04/02/2015, sputum culture positive for Moraxella 06/19/2015 9. Low IgG and IgA 10. CT chest 05/13/2015 with bilateral lower lung consolidation 11. CT chest, abdomen, and pelvis 01/08/2016-new peribronchial thickening and peripheral tree in bud pattern at the right lower lobe, no lymphadenopathy 12. Sinus mucosal thickening/opacification on CT 08/16/2015-status post sinus surgery 12/05/2015 13. Sputum culture positive for pseudomonas aeruginosa on 01/10/2016-treated with 10 days of ciprofloxacin 14. 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 Admission with pneumonia 08/26/2016 16. Left lung nodule increased in size on a CT 01/06/2019, not hypermetabolic on the PET scan 7/51/7001, followed by Dr. Elsworth Soho 17.Admission with rectal bleeding 01/02/2020-diverticular? 18.  Pain at the right arm and left iliac-likely related to lymphoma, plain x-ray 05/07/2020 at [facility] revealed destructive changes in the right humerus and radius, x-ray of the pelvis 05/15/2020-subtle lucent lesion in the right ilium 19.  Hospital admission 06/02/2020 -pneumonia, AKI, anemia 20.  Pain secondary to lymphoma involving the pelvis and pelvic fractures-initiation of palliative radiation 06/06/2020  Ms. Allocca appears stable.  She continues to have pain to her left ischium with movement.  Pain controlled with current as needed pain medications.  Receiving palliative radiation.     CBC from  this morning has been reviewed and she is developing neutropenia secondary to recent chemotherapy.  G-CSF is not indicated at this time.  Hemoglobin stable following transfusion earlier this week.  Recommendations: 1.  Continue fentanyl patch at 50 mcg/h. 2.  Continue as needed Dilaudid and oxycodone. 3.  Continue palliative radiation under the care of Dr. Lisbeth Renshaw. 4.  Limit blood draws.  PRBC transfusion for hemoglobin less than 7. 5.  Recommend repeat CBC on Sunday.  Hold on G-CSF. 6.  Obtain blood/urine cultures and begin broad-spectrum intravenous antibiotics for a fever of greater than 100.5 degrees 7.  Call oncology as needed over the weekend, we will see her 06/10/2020  Please call medical oncology over the weekend for questions.   LOS: 3 days   Mikey Bussing 06/07/20  Ms. Latella was interviewed and examined.  Her pain appeared improved when I saw her this morning.  She is now at day 8 following cycle 2 chemotherapy.  She has developed neutropenia.  There is no indication for G-CSF at present.  I recommend obtaining cultures and starting IV antibiotics for a fever spike.  I will check on her 06/10/2020.  Please call oncology as needed over the weekend.

## 2020-06-08 LAB — BPAM RBC
Blood Product Expiration Date: 202111262359
Blood Product Expiration Date: 202112042359
ISSUE DATE / TIME: 202111021430
Unit Type and Rh: 9500
Unit Type and Rh: 9500

## 2020-06-08 LAB — TYPE AND SCREEN
ABO/RH(D): AB POS
Antibody Screen: POSITIVE
DAT, IgG: NEGATIVE
Unit division: 0
Unit division: 0

## 2020-06-08 LAB — BASIC METABOLIC PANEL
Anion gap: 14 (ref 5–15)
BUN: 56 mg/dL — ABNORMAL HIGH (ref 8–23)
CO2: 20 mmol/L — ABNORMAL LOW (ref 22–32)
Calcium: 9 mg/dL (ref 8.9–10.3)
Chloride: 103 mmol/L (ref 98–111)
Creatinine, Ser: 1.88 mg/dL — ABNORMAL HIGH (ref 0.44–1.00)
GFR, Estimated: 26 mL/min — ABNORMAL LOW (ref >60)
Glucose, Bld: 86 mg/dL (ref 70–99)
Potassium: 4.4 mmol/L (ref 3.5–5.1)
Sodium: 137 mmol/L (ref 135–145)

## 2020-06-08 LAB — CULTURE, BLOOD (ROUTINE X 2): Culture: NO GROWTH

## 2020-06-08 MED ORDER — ALPRAZOLAM 1 MG PO TABS: 1.0000 mg | ORAL_TABLET | Freq: Three times a day (TID) | ORAL | Status: DC | PRN

## 2020-06-08 MED ORDER — OXYCODONE HCL ER 10 MG PO T12A
10.0000 mg | EXTENDED_RELEASE_TABLET | Freq: Two times a day (BID) | ORAL | Status: DC
  Administered 2020-06-08 – 2020-06-09 (×4): 10 mg via ORAL
  Filled 2020-06-08 (×4): qty 1

## 2020-06-08 MED ORDER — LORAZEPAM 0.5 MG PO TABS: 0.5000 mg | ORAL_TABLET | Freq: Once | ORAL | Status: AC

## 2020-06-08 NOTE — Progress Notes (Signed)
PROGRESS NOTE    KATELYNE GALSTER  XNT:[id number] DOB: 01-06-1931 DOA: 06/02/2020 PCP: Lavone Orn, MD    Brief Narrative:  Mrs. Cabeza was admitted to the hospital with a working diagnosis of multifocal pneumonia, complicated with painful bony metastatic lesions and atrial fibrillation with RVR.  86 year old female with past medical history for Waledestrom's,non-Hodgkin's lymphoma, known metastatic disease and bone marrow infiltration. Currently on chemotherapy. Last cycle 2 days prior to hospitalization. At home she developed chills, generalized malaise, fatigue, and cough. On her initial physical examination blood pressure 116/64, heart rate 97, respiratory rate 35, oxygen saturation 92%, moist mucous membranes, lungs with no wheezing or rhonchi, heart S1-S2, present and rhythmic, soft abdomen, no lower extremity edema Sodium 138, potassium 4.7, chloride 102, bicarb 22, glucose 111, BUN 41, creatinine 2.0, white count 9.1, hemoglobin 7.3, hematocrit 21.5, platelets 534. SARS COVID-19 negative. Urinalysis specific gravity 1.015, 6-10 white cells.  Chest radiograph with bibasilar infiltrates, more prominent on the right. CT of the abdomen with bibasilar infiltrates, on the left retrocardiac. Innumerable lytic lesions throughout the pelvic bones and ribs consistent with osseous metastatic disease. Nondisplaced pathologic and not pathologic fractures of the sacrum bilaterally. Pathologic enlarged 6 cm right inguinal lymph node. Cholelithiasis with mild gallbladder wall thickening. New compression fracture L5 vertebral body with approximately 50% height loss. Old L4 compression fracture.  Patient has been placed on broad-spectrum antibiotic therapy with good toleration. She continue to havesignificant pain related to bony metastasis and fractures.  Plan for palliative radiation to bony mets to her hip. Pain has been very severe and has limited her ability to move.   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 Bilateral bibasilar community acquired pneumonia/ present on admission.Complicated with acute hypoxemic respiratory failure Sp antibiotic therapy with ceftriaxone and azithromycin.  Very poor mobility due to pain, not able to doincentive spirometer or flutter valve. Continue withalbuterol as needed.  Oxymetry monitoring, keep 02 saturation more than 92%   2. 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  Salvage systemic therapy for [facility] 05/31/20 (#2 cycle). Lytic bony lesions, likely related to lymphoma.  Currently getting palliative radiation therapy for pain control. (pre-medication with hydromorphone and lorazepam)  Her pain has been very intense, last night and this am received lorazepam to help control her symptoms.  Will discontinue fentanyl patch and will add oxycodone SR 10 mg po bid, continue with as needed oxycodone IR to 10 mg and hydromorphone to 1 mg as needed every 2 H.  For anxiety will use alprazolam for now to prevent oversedation.   On cheduled acetaminophen, and gabapentin to 200 mg po tid.   3. AKI on CKD stage 3a. Stable renal function with serum cr at 1,88 and K at 4,4 with bicarbonate at 20.  Patient is tolerating po well, with no nausea or vomiting.   4. Chronic anemia of malignancy/ iron deficiency.Sp1unit PRBC,. Iron 34, tibc 120, transferrin saturation is 28, ferritin 2152, transferrin 86.   Cell count has been stable.   5. Paroxysmal atrial fibrillation  with RVR. Continue rate  control with diltiazem and metoprolol, anticoagulation with apixaban.  She had episodic RVR, will keep on telemetry monitoring for now.   6. COPD. Noclinical signs of clinical acute exacerbation, on wbronchodilators and inhaled steroids   Status is: Inpatient  Remains inpatient appropriate because:Inpatient level of care appropriate due to severity of illness   Dispo: The patient is from: SNF              Anticipated d/c is to: SNF              Anticipated d/c date is: 3 days              Patient currently is not medically stable to d/c.  DVT prophylaxis: apixaban   Code Status:   dnr   Family Communication:  I spoke with her daughter yesterday for update and all questions were addressed.      Nutrition Status: Nutrition Problem: Increased nutrient needs Etiology: cancer and cancer related treatments Signs/Symptoms: estimated needs Interventions: Boost Plus, MVI     Consultants:   Oncology   Radiation oncology   Subjective: Patient this am was in pain, severe in intensity, she received analgesics and lorazepam, but the time of my examination she is somnolent but easy to arouse. Continue to have pain at the right hip.   Objective: Vitals:   06/07/20 1000 06/07/20 2103 06/08/20 0515 06/08/20 0821  BP: 112/80 134/61 (!) 123/57   Pulse: (!) 110 87 72   Resp:  20 12   Temp: 98.2 F (36.8 C) 97.8 F (36.6 C) 97.9 F (36.6 C)   TempSrc:  Oral Oral   SpO2: 95% 92% 99% 97%  Weight:      Height:       No intake or output data in the 24 hours ending 06/08/20 1103 Filed Weights   06/02/20 1511 06/03/20 0958 06/03/20 2000  Weight: 115.7 kg 121.5 kg 121.5 kg    Examination:   General: deconditioned and ill looking appearing  Neurology: somnolent, but easy to arouse, follows commands.  E ENT: no pallor, no icterus, oral mucosa moist Cardiovascular: No JVD. S1-S2 present, rhythmic, no gallops, rubs, or murmurs. No lower extremity  edema. Pulmonary: positive breath sounds bilaterally, no wheezing, mild scattered rhonchi and rales. Gastrointestinal. Abdomen soft and non tender Skin. No rashes Musculoskeletal: no joint deformities     Data Reviewed: I have personally reviewed following labs and imaging studies  CBC: Recent Labs  Lab 06/02/20 1523 06/03/20 1014 06/04/20 0237 06/05/20 0355 06/07/20 0347  WBC 9.1 8.0 6.4 3.2* 2.0*  NEUTROABS 8.1*  --  5.4 2.3 0.8*  HGB 7.3* 7.7* 6.8* 7.5* 8.1*  HCT 21.5* 22.4* 18.2* 22.3* 23.5*  MCV 99.5 100.9* 100.0 94.9 97.5  PLT 534* 612* 428* 354 712   Basic Metabolic Panel: Recent Labs  Lab 06/02/20 1523 06/03/20 1014 06/04/20 0237 06/05/20 0355 06/08/20 0452  NA 138 136 137 137 137  K 4.7 5.0 4.6 4.0 4.4  CL 102 103 105 105 103  CO2 22 21* 20* 21* 20*  GLUCOSE 111* 96 99 105* 86  BUN 41* 43* 42* 48* 56*  CREATININE 2.01* 2.01* 1.86* 1.99* 1.88*  CALCIUM 9.1 8.7* 8.7* 8.7* 9.0  MG  --   --  2.2  --   --   PHOS  --   --  4.2  --   --    GFR: Estimated Creatinine Clearance: 28 mL/min (A) (by C-G formula based on SCr of 1.88 mg/dL (H)).  Liver Function Tests: Recent Labs  Lab 06/02/20 1523 06/03/20 1014 06/04/20 0237  AST 18 21  --   ALT 7 10  --   ALKPHOS 91 90  --   BILITOT 0.9 1.3*  --   PROT 8.5* 7.9  --   ALBUMIN 2.5* 2.3* 2.4*   No results for input(s): LIPASE, AMYLASE in the last 168 hours. No results for input(s): AMMONIA in the last 168 hours. Coagulation Profile: Recent Labs  Lab 06/02/20 1523  INR 1.4*   Cardiac Enzymes: No results for input(s): CKTOTAL, CKMB, CKMBINDEX, TROPONINI in the last 168 hours. BNP (last 3 results) Recent Labs    09/13/19 0938  PROBNP 133.0*   HbA1C: No results for input(s): HGBA1C in the last 72 hours. CBG: No results for input(s): GLUCAP in the last 168 hours. Lipid Profile: No results for input(s): CHOL, HDL, LDLCALC, TRIG, CHOLHDL, LDLDIRECT in the last 72 hours. Thyroid Function Tests: No  results for input(s): TSH, T4TOTAL, FREET4, T3FREE, THYROIDAB in the last 72 hours. Anemia Panel: No results for input(s): VITAMINB12, FOLATE, FERRITIN, TIBC, IRON, RETICCTPCT in the last 72 hours.    Radiology Studies: I have reviewed all of the imaging during this hospital visit personally     Scheduled Meds:  sodium chloride   Intravenous Once   acetaminophen  650 mg Oral Q6H   allopurinol  100 mg Oral BID   apixaban  2.5 mg Oral BID   calcium-vitamin D  1 tablet Oral Q breakfast   Chlorhexidine Gluconate Cloth  6 each Topical Daily   diltiazem  180 mg Oral Daily   fentaNYL  1 patch Transdermal Q72H   fluticasone furoate-vilanterol  1 puff Inhalation Daily   gabapentin  200 mg Oral TID   lactose free nutrition  237 mL Oral TID BM   multivitamin with minerals  1 tablet Oral Daily   pantoprazole  40 mg Oral Daily   Continuous Infusions:   LOS: 4 days        Dima Mini Gerome Apley, MD

## 2020-06-09 LAB — CBC WITH DIFFERENTIAL/PLATELET
Abs Immature Granulocytes: 0.03 10*3/uL (ref 0.00–0.07)
Basophils Absolute: 0 10*3/uL (ref 0.0–0.1)
Basophils Relative: 1 %
Eosinophils Absolute: 0.1 10*3/uL (ref 0.0–0.5)
Eosinophils Relative: 6 %
HCT: 22.3 % — ABNORMAL LOW (ref 36.0–46.0)
Hemoglobin: 7.8 g/dL — ABNORMAL LOW (ref 12.0–15.0)
Immature Granulocytes: 1 %
Lymphocytes Relative: 37 %
Lymphs Abs: 0.8 10*3/uL (ref 0.7–4.0)
MCH: 34.8 pg — ABNORMAL HIGH (ref 26.0–34.0)
MCHC: 35 g/dL (ref 30.0–36.0)
MCV: 99.6 fL (ref 80.0–100.0)
Monocytes Absolute: 0.4 10*3/uL (ref 0.1–1.0)
Monocytes Relative: 17 %
Neutro Abs: 0.8 10*3/uL — ABNORMAL LOW (ref 1.7–7.7)
Neutrophils Relative %: 38 %
Platelets: 291 10*3/uL (ref 150–400)
RBC: 2.24 MIL/uL — ABNORMAL LOW (ref 3.87–5.11)
RDW: 24 % — ABNORMAL HIGH (ref 11.5–15.5)
WBC: 2.1 10*3/uL — ABNORMAL LOW (ref 4.0–10.5)
nRBC: 0 % (ref 0.0–0.2)

## 2020-06-09 NOTE — Progress Notes (Signed)
Have alerted MD to pt having congestion/cough she did not have yesterday. ?aspiration. Upon telling patient that a chest xray would be in order, she asked to NOT have a chest xray. The patient requested for staff "to let her go". MD aware.

## 2020-06-09 NOTE — Progress Notes (Signed)
MD aware of pt's 5 beat run of Vtach. Pt sleeping.

## 2020-06-09 NOTE — Progress Notes (Signed)
PROGRESS NOTE    Natasha Ellis DOA: 06/02/2020 PCP: Lavone Orn, MD    Brief Narrative:  Natasha Ellis was admitted to the hospital with a working diagnosis of multifocal pneumonia, complicated with painful bony metastatic lesions and atrial fibrillation with RVR.  86 year old female with past medical history for Waledestrom's,non-Hodgkin's lymphoma, known metastatic disease and bone marrow infiltration. Currently on chemotherapy. Last cycle 2 days prior to hospitalization. At home she developed chills, generalized malaise, fatigue, and cough. On her initial physical examination blood pressure 116/64, heart rate 97, respiratory rate 35, oxygen saturation 92%, moist mucous membranes, lungs with no wheezing or rhonchi, heart S1-S2, present and rhythmic, soft abdomen, no lower extremity edema Sodium 138, potassium 4.7, chloride 102, bicarb 22, glucose 111, BUN 41, creatinine 2.0, white count 9.1, hemoglobin 7.3, hematocrit 21.5, platelets 534. SARS COVID-19 negative. Urinalysis specific gravity 1.015, 6-10 white cells.  Chest radiograph with bibasilar infiltrates, more prominent on the right. CT of the abdomen with bibasilar infiltrates, on the left retrocardiac. Innumerable lytic lesions throughout the pelvic bones and ribs consistent with osseous metastatic disease. Nondisplaced pathologic and not pathologic fractures of the sacrum bilaterally. Pathologic enlarged 6 cm right inguinal lymph node. Cholelithiasis with mild gallbladder wall thickening. New compression fracture L5 vertebral body with approximately 50% height loss. Old L4 compression fracture.  Patient has been placed on broad-spectrum antibiotic therapy with good toleration. She continue to havesignificant pain related to bony metastasis and fractures.  Plan for palliative radiation to bony mets to her hip. Pain has been very severe and has limited her ability to move.  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 Bilateral bibasilar community acquired pneumonia/ present on admission.Complicated with acute hypoxemic respiratory failure Sp antibiotic therapy with ceftriaxone and azithromycin.  Continue to have a very poor mobility due to pain, she is not able to doincentive spirometer or flutter valve. Albuterol as needed and continue with oxymetry monitoring, keep 02 saturation more than 92%   2. 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  Salvage systemic therapy for [facility] 05/31/20 (#2 cycle). Lyticbony lesions, likely related to lymphoma.  Palliative radiation therapy for pain control #2/5.(pre-medication with hydromorphone and lorazepam)  Her pain has been better controlled with oxycodone SR 10 mg po bid, plus as neededoxycodone IR 10 mg (#0 doses yesterday) and hydromorphone to 1 mg as needed every 2 H (#2 doses yesterday).  Continue with alprazolam PRN for anxiety.  Continue with scheduled acetaminophen andgabapentin to 200 mg po tid.  Patient comes from SNF, need to check if she can be transported to radiation therapy while outpatient.   3. AKI on CKD stage 3a.Her renal function has been stable. Patient with poor oral intake this am. Follow up on renal panel in am. For now hold on IV fluids.   4. Chronic anemia of malignancy/ iron deficiency.Sp2unit PRBC, Iron 34, tibc 120, transferrin saturation is 28, ferritin 2152, transferrin 86.  Follow up Hgb this am is 7.8 and Hct at 22.3. Hold  on PRBC transfusion for now.  5. Paroxysmal atrial fibrillation with RVR. No further episodes of RVR, will continue rate control with diltiazem and metoprolol, continue with apixaban for anticoagulation    Continue with telemetry monitoring for now.   6. COPD. Nocurrent signs of clinical exacerbation,continue with bronchodilators and inhaled steroids  Status is: Inpatient  Remains inpatient appropriate because:Inpatient level of care appropriate due to severity of illness   Dispo: The patient is from: SNF              Anticipated d/c is to: SNF              Anticipated d/c date is: 2 days              Patient currently is not medically stable to d/c.   DVT prophylaxis: apixaban   Code Status:   DNR   Family Communication:  I spoke over the phone with the patient's daughter about patient's  condition, plan of care, prognosis and all questions were addressed.    Nutrition Status: Nutrition Problem: Increased nutrient needs Etiology: cancer and cancer related treatments Signs/Symptoms: estimated needs Interventions: Boost Plus, MVI      Consultants:   Radiation oncology   Oncology    Subjective: Patient continue to have persistent pain, she has been somnolent at the time of my examination, apparently later in the day she is more reactive. She continue to complain of pain, not able to move.   Objective: Vitals:   06/08/20 1315 06/08/20 2056 06/09/20 0429 06/09/20 0951  BP: (!) 125/58 (!) 109/52 (!) 116/54   Pulse: 73 67 76   Resp: 16 (!) 21 19   Temp: 97.8 F (36.6 C) 97.6 F (36.4 C) 97.8 F (36.6 C)   TempSrc: Oral Oral Oral   SpO2: 96% 98% 100% 97%  Weight:      Height:        Intake/Output Summary (Last 24 hours) at 06/09/2020 1056 Last data filed at 06/09/2020 0659 Gross per 24 hour  Intake 180 ml  Output 950 ml  Net -770 ml   Filed Weights   06/02/20 1511 06/03/20 0958 06/03/20 2000  Weight: 115.7 kg 121.5 kg 121.5 kg    Examination:    General: deconditioned and ill looking appearing, continue to be in pain, worse with movement.  Neurology: Awake and alert, non focal  E ENT: positive pallor, no icterus, oral mucosa dry.  Cardiovascular: No JVD. S1-S2 present, rhythmic, no gallops, rubs, or murmurs. +/++ pitting bilateral lower extremity edema. Pulmonary: positive breath sounds bilaterally, decreased inspiratory effort, scattered rhonchi Gastrointestinal. Abdomen soft and non tender Skin. No rashes Musculoskeletal: no joint deformities     Data Reviewed: I have personally reviewed following labs and imaging studies  CBC: Recent Labs  Lab 06/02/20 1523 06/02/20 1523 06/03/20 1014 06/04/20 0237 06/05/20 0355 06/07/20 0347 06/09/20 0517  WBC 9.1   < > 8.0 6.4 3.2* 2.0* 2.1*  NEUTROABS 8.1*  --   --  5.4 2.3 0.8* 0.8*  HGB 7.3*   < > 7.7* 6.8* 7.5* 8.1* 7.8*  HCT 21.5*   < > 22.4* 18.2* 22.3* 23.5* 22.3*  MCV 99.5   < > 100.9* 100.0 94.9 97.5 99.6  PLT 534*   < > 612* 428* 354 347 291   < > = values in this interval not displayed.   Basic Metabolic Panel: Recent Labs  Lab 06/02/20 1523 06/03/20 1014 06/04/20 0237 06/05/20 0355 06/08/20 0452  NA 138 136 137 137 137  K 4.7  5.0 4.6 4.0 4.4  CL 102 103 105 105 103  CO2 22 21* 20* 21* 20*  GLUCOSE 111* 96 99 105* 86  BUN 41* 43* 42* 48* 56*  CREATININE 2.01* 2.01* 1.86* 1.99* 1.88*  CALCIUM 9.1 8.7* 8.7* 8.7* 9.0  MG  --   --  2.2  --   --   PHOS  --   --  4.2  --   --    GFR: Estimated Creatinine Clearance: 28 mL/min (A) (by C-G formula based on SCr of 1.88 mg/dL (H)). Liver Function Tests: Recent Labs  Lab 06/02/20 1523 06/03/20 1014 06/04/20 0237  AST 18 21  --   ALT 7 10  --   ALKPHOS 91 90  --   BILITOT 0.9 1.3*  --   PROT 8.5* 7.9  --   ALBUMIN 2.5* 2.3* 2.4*   No results for input(s): LIPASE, AMYLASE in the last 168 hours. No results for input(s): AMMONIA in the last 168 hours. Coagulation Profile: Recent Labs  Lab  06/02/20 1523  INR 1.4*   Cardiac Enzymes: No results for input(s): CKTOTAL, CKMB, CKMBINDEX, TROPONINI in the last 168 hours. BNP (last 3 results) Recent Labs    09/13/19 0938  PROBNP 133.0*   HbA1C: No results for input(s): HGBA1C in the last 72 hours. CBG: No results for input(s): GLUCAP in the last 168 hours. Lipid Profile: No results for input(s): CHOL, HDL, LDLCALC, TRIG, CHOLHDL, LDLDIRECT in the last 72 hours. Thyroid Function Tests: No results for input(s): TSH, T4TOTAL, FREET4, T3FREE, THYROIDAB in the last 72 hours. Anemia Panel: No results for input(s): VITAMINB12, FOLATE, FERRITIN, TIBC, IRON, RETICCTPCT in the last 72 hours.    Radiology Studies: I have reviewed all of the imaging during this hospital visit personally     Scheduled Meds: . sodium chloride   Intravenous Once  . acetaminophen  650 mg Oral Q6H  . allopurinol  100 mg Oral BID  . apixaban  2.5 mg Oral BID  . calcium-vitamin D  1 tablet Oral Q breakfast  . Chlorhexidine Gluconate Cloth  6 each Topical Daily  . diltiazem  180 mg Oral Daily  . fluticasone furoate-vilanterol  1 puff Inhalation Daily  . gabapentin  200 mg Oral TID  . lactose free nutrition  237 mL Oral TID BM  . multivitamin with minerals  1 tablet Oral Daily  . oxyCODONE  10 mg Oral Q12H  . pantoprazole  40 mg Oral Daily   Continuous Infusions:   LOS: 5 days        Emira Eubanks Gerome Apley, MD

## 2020-06-10 ENCOUNTER — Ambulatory Visit
Admit: 2020-06-10 | Discharge: 2020-06-10 | Disposition: A | Discharge status: Home / Self Care | Payer: Medicare Other | Attending: Radiation Oncology | Admitting: Radiation Oncology

## 2020-06-10 DIAGNOSIS — J189 Pneumonia, unspecified organism: Secondary | ICD-10-CM | POA: Diagnosis not present

## 2020-06-10 LAB — CBC WITH DIFFERENTIAL/PLATELET
Abs Immature Granulocytes: 0.05 10*3/uL (ref 0.00–0.07)
Basophils Absolute: 0 10*3/uL (ref 0.0–0.1)
Basophils Relative: 1 %
Eosinophils Absolute: 0.1 10*3/uL (ref 0.0–0.5)
Eosinophils Relative: 5 %
HCT: 24.8 % — ABNORMAL LOW (ref 36.0–46.0)
Hemoglobin: 7.6 g/dL — ABNORMAL LOW (ref 12.0–15.0)
Immature Granulocytes: 2 %
Lymphocytes Relative: 42 %
Lymphs Abs: 1.1 10*3/uL (ref 0.7–4.0)
MCH: 28.9 pg (ref 26.0–34.0)
MCHC: 30.6 g/dL (ref 30.0–36.0)
MCV: 94.3 fL (ref 80.0–100.0)
Monocytes Absolute: 0.5 10*3/uL (ref 0.1–1.0)
Monocytes Relative: 18 %
Neutro Abs: 0.8 10*3/uL — ABNORMAL LOW (ref 1.7–7.7)
Neutrophils Relative %: 32 %
Platelets: 294 10*3/uL (ref 150–400)
RBC: 2.63 MIL/uL — ABNORMAL LOW (ref 3.87–5.11)
RDW: 19.5 % — ABNORMAL HIGH (ref 11.5–15.5)
WBC: 2.6 10*3/uL — ABNORMAL LOW (ref 4.0–10.5)
nRBC: 0 % (ref 0.0–0.2)

## 2020-06-10 LAB — BASIC METABOLIC PANEL
Anion gap: 9 (ref 5–15)
BUN: 51 mg/dL — ABNORMAL HIGH (ref 8–23)
CO2: 24 mmol/L (ref 22–32)
Calcium: 8.9 mg/dL (ref 8.9–10.3)
Chloride: 103 mmol/L (ref 98–111)
Creatinine, Ser: 1.48 mg/dL — ABNORMAL HIGH (ref 0.44–1.00)
GFR, Estimated: 34 mL/min — ABNORMAL LOW (ref >60)
Glucose, Bld: 96 mg/dL (ref 70–99)
Potassium: 4.3 mmol/L (ref 3.5–5.1)
Sodium: 136 mmol/L (ref 135–145)

## 2020-06-10 MED ORDER — OXYCODONE HCL 5 MG PO TABS
5.0000 mg | ORAL_TABLET | ORAL | Status: DC | PRN
  Administered 2020-06-10 – 2020-06-12 (×6): 5 mg via ORAL
  Filled 2020-06-10 (×6): qty 1

## 2020-06-10 NOTE — Progress Notes (Signed)
PROGRESS NOTE    Natasha Ellis  EVO:[id number] DOB: 1931/04/15 DOA: 06/02/2020 PCP: Lavone Orn, MD    Brief Narrative:  Natasha Ellis was admitted to the hospital with a working diagnosis of multifocal pneumonia, complicated with painful bony metastatic lesions and atrial fibrillation with RVR.  86 year old female with past medical history for Waledestrom's,non-Hodgkin's lymphoma, known metastatic disease and bone marrow infiltration. Currently on chemotherapy. Last cycle 2 days prior to hospitalization. At home she developed chills, generalized malaise, fatigue, and cough. On her initial physical examination blood pressure 116/64, heart rate 97, respiratory rate 35, oxygen saturation 92%, moist mucous membranes, lungs with no wheezing or rhonchi, heart S1-S2, present and rhythmic, soft abdomen, no lower extremity edema Sodium 138, potassium 4.7, chloride 102, bicarb 22, glucose 111, BUN 41, creatinine 2.0, white count 9.1, hemoglobin 7.3, hematocrit 21.5, platelets 534. SARS COVID-19 negative. Urinalysis specific gravity 1.015, 6-10 white cells.  Chest radiograph with bibasilar infiltrates, more prominent on the right. CT of the abdomen with bibasilar infiltrates, on the left retrocardiac. Innumerable lytic lesions throughout the pelvic bones and ribs consistent with osseous metastatic disease. Nondisplaced pathologic and not pathologic fractures of the sacrum bilaterally. Pathologic enlarged 6 cm right inguinal lymph node. Cholelithiasis with mild gallbladder wall thickening. New compression fracture L5 vertebral body with approximately 50% height loss. Old L4 compression fracture.  Patient has been placed on broad-spectrum antibiotic therapy with good toleration. She continue to havesignificant pain related to bony metastasis and fractures.  Plan for palliative radiation to bony mets to her hip. Pain has been very severe and has limited her ability to  move.    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 Bilateral bibasilar community acquired pneumonia/ present on admission.Complicated with acute hypoxemic respiratory failure Sp antibiotic therapy with ceftriaxone and azithromycin.  Patient continue to have a very poor mobility due to pain, not able to doincentive spirometerorflutter valve. Yesterday she declined further work up for dyspnea.   Continue with albuterol as needed and continue with oxymetry monitoring, keep 02 saturation more than 92% This am her oxymetry is 97% on 2 L/ min per .   2. 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  Salvage systemic therapyfor WM10/29/21 (#2 cycle). Lyticbony lesions, likely related to lymphoma.  Resume today palliative radiation therapy for pain control #3/5.(pre-medication with hydromorphone and lorazepam)  Due to somnolence, her long acting analgesics and gabapentin have been discontinued this am. Now only on as needed oxycodone 5 mg and scheduled acetaminophen, continue to monitor for signs of worsening pain. Continue with as needed alprazolam.  Plan to return to SNF when pain more controlled.   3. AKI on CKD stage 3a.patient with poor oral intake, her renal function this am has a serum cr of 1,48 with K of 4,3 and bicarbonate of 24. Continue close monitoring of renal function and electrolytes. Avoid hypotension and nephrotoxic medications.    4. Chronic anemia of malignancy/ iron deficiency.Sp2unit  PRBC, Iron 34, tibc 120, transferrin saturation is 28, ferritin 2152, transferrin 86.  Slowly trending down Hgb down  to 7,6 and hct at 24,8, hold on PRBC transfusion for now. Patient with very poor mobility and low functional status.   5. Paroxysmal atrial fibrillation with RVR. Continue rate control with metoprolol and diltiazem, continue anticoagulation with apixaban, Continue telemetry monitoring.  6. COPD. On bronchodilators and inhaled steroids, no clinical signs of exacerbation.    Status is: Inpatient  Remains inpatient appropriate because:Inpatient level of care appropriate due to severity of illness   Dispo: The patient is from: SNF              Anticipated d/c is to: SNF              Anticipated d/c date is: 2 days              Patient currently is not medically stable to d/c.   DVT prophylaxis: apixaban   Code Status:   dnr   Family Communication:  No family at the bedside      Nutrition Status: Nutrition Problem: Increased nutrient needs Etiology: cancer and cancer related treatments Signs/Symptoms: estimated needs Interventions: Boost Plus, MVI    Consultants:   Oncology   Radiation oncology       Subjective: Patient this am is more awake and alert, she had no long active opiate this am, no nausea or vomiting. Pain still present but stable. Worse with movement.   Objective: Vitals:   06/09/20 2045 06/10/20 0456 06/10/20 0733 06/10/20 0902  BP: (!) 115/55 (!) 117/54  (!) 115/54  Pulse: 83 78  73  Resp: (!) 23 19  18   Temp: 97.9 F (36.6 C) 98.3 F (36.8 C)  97.7 F (36.5 C)  TempSrc: Oral Oral  Oral  SpO2: 97% 94% 94% 97%  Weight:      Height:        Intake/Output Summary (Last 24 hours) at 06/10/2020 1202 Last data filed at 06/10/2020 0500 Gross per 24 hour  Intake 120 ml  Output 300 ml  Net -180 ml   Filed Weights   06/02/20 1511 06/03/20 0958 06/03/20 2000  Weight: 115.7 kg 121.5 kg 121.5 kg    Examination:   General: Not  in pain or dyspnea, deconditioned  Neurology: Awake and alert, non focal  E ENT: mild pallor, no icterus, oral mucosa moist Cardiovascular: No JVD. S1-S2 present, rhythmic, no gallops, rubs, or murmurs. No lower extremity edema. Pulmonary: positive breath sounds bilaterally, with no wheezing, rhonchi or rales.poor inspiratory effort.  Gastrointestinal. Abdomen soft and non tender Skin. No rashes Musculoskeletal: no joint deformities     Data Reviewed: I have personally reviewed following labs and imaging studies  CBC: Recent Labs  Lab 06/04/20 0237 06/05/20 0355 06/07/20 0347 06/09/20 0517 06/10/20 0336  WBC 6.4 3.2* 2.0* 2.1* 2.6*  NEUTROABS 5.4 2.3 0.8* 0.8* 0.8*  HGB 6.8* 7.5* 8.1* 7.8* 7.6*  HCT 18.2* 22.3* 23.5* 22.3* 24.8*  MCV 100.0 94.9 97.5 99.6 94.3  PLT 428* 354 347 291 740   Basic Metabolic Panel: Recent Labs  Lab 06/04/20 0237 06/05/20 0355 06/08/20 0452 06/10/20 0336  NA 137 137 137 136  K 4.6 4.0 4.4 4.3  CL 105 105 103 103  CO2 20* 21* 20* 24  GLUCOSE 99 105* 86 96  BUN 42* 48* 56* 51*  CREATININE 1.86* 1.99* 1.88* 1.48*  CALCIUM 8.7* 8.7* 9.0 8.9  MG 2.2  --   --   --   PHOS 4.2  --   --   --  GFR: Estimated Creatinine Clearance: 35.6 mL/min (A) (by C-G formula based on SCr of 1.48 mg/dL (H)). Liver Function Tests: Recent Labs  Lab 06/04/20 0237  ALBUMIN 2.4*   No results for input(s): LIPASE, AMYLASE in the last 168 hours. No results for input(s): AMMONIA in the last 168 hours. Coagulation Profile: No results for input(s): INR, PROTIME in the last 168 hours. Cardiac Enzymes: No results for input(s): CKTOTAL, CKMB, CKMBINDEX, TROPONINI in the last 168 hours. BNP (last 3 results) Recent Labs    09/13/19 0938  PROBNP 133.0*   HbA1C: No results for input(s): HGBA1C in the last 72 hours. CBG: No results for input(s): GLUCAP in the last 168 hours. Lipid Profile: No results for input(s): CHOL, HDL, LDLCALC, TRIG, CHOLHDL, LDLDIRECT  in the last 72 hours. Thyroid Function Tests: No results for input(s): TSH, T4TOTAL, FREET4, T3FREE, THYROIDAB in the last 72 hours. Anemia Panel: No results for input(s): VITAMINB12, FOLATE, FERRITIN, TIBC, IRON, RETICCTPCT in the last 72 hours.    Radiology Studies: I have reviewed all of the imaging during this hospital visit personally     Scheduled Meds: . sodium chloride   Intravenous Once  . acetaminophen  650 mg Oral Q6H  . allopurinol  100 mg Oral BID  . apixaban  2.5 mg Oral BID  . calcium-vitamin D  1 tablet Oral Q breakfast  . Chlorhexidine Gluconate Cloth  6 each Topical Daily  . diltiazem  180 mg Oral Daily  . fluticasone furoate-vilanterol  1 puff Inhalation Daily  . lactose free nutrition  237 mL Oral TID BM  . multivitamin with minerals  1 tablet Oral Daily  . pantoprazole  40 mg Oral Daily   Continuous Infusions:   LOS: 6 days        Toan Mort Gerome Apley, MD

## 2020-06-10 NOTE — Care Management Important Message (Signed)
Important Message  Patient Details IM Letter given to the Patient Name: MITSUE PEERY MRN: [id number] Date of Birth: 1930/08/08   Medicare Important Message Given:  Yes     Kerin Salen 06/10/2020, 11:19 AM

## 2020-06-10 NOTE — Progress Notes (Addendum)
HEMATOLOGY-ONCOLOGY PROGRESS NOTE  SUBJECTIVE: Natasha Ellis has completed 2 treatments with palliative radiation to the pelvis.  She is lethargic this morning.  Oncology History  Waldenstrom's macroglobulinemia ([place])  04/19/2014 Initial Diagnosis   Waldenstrom's macroglobulinemia ([place])   11/27/2019 - 12/27/2019 Chemotherapy   The patient had palonosetron (ALOXI) injection 0.25 mg, 0.25 mg, Intravenous,  Once, 2 of 4 cycles Administration: 0.25 mg (11/27/2019), 0.25 mg (12/26/2019) bendamustine (BENDEKA) 175 mg in sodium chloride 0.9 % 50 mL (3.0702 mg/mL) chemo infusion, 70 mg/m2 = 175 mg (100 % of original dose 70 mg/m2), Intravenous,  Once, 2 of 4 cycles Dose modification: 70 mg/m2 (original dose 70 mg/m2, Cycle 1, Reason: Provider Judgment) Administration: 175 mg (11/27/2019), 175 mg (11/28/2019), 175 mg (12/26/2019), 175 mg (12/27/2019) riTUXimab-pvvr (RUXIENCE) 900 mg in sodium chloride 0.9 % 250 mL (2.6471 mg/mL) infusion, 375 mg/m2 = 900 mg, Intravenous,  Once, 2 of 4 cycles Administration: 900 mg (11/27/2019), 900 mg (12/26/2019)  for chemotherapy treatment.    05/09/2020 -  Chemotherapy   The patient had palonosetron (ALOXI) injection 0.25 mg, 0.25 mg, Intravenous,  Once, 2 of 4 cycles Administration: 0.25 mg (05/09/2020), 0.25 mg (05/31/2020) pentostatin (NIPENT) 4.8 mg in sodium chloride 0.9 % 50 mL chemo infusion, 2 mg/m2 = 4.8 mg (100 % of original dose 2 mg/m2), Intravenous,  Once, 2 of 4 cycles Dose modification: 2 mg/m2 (original dose 2 mg/m2, Cycle 1, Reason: Provider Judgment) Administration: 4.8 mg (05/09/2020), 4.8 mg (05/31/2020) cyclophosphamide (CYTOXAN) 920 mg in sodium chloride 0.9 % 250 mL chemo infusion, 400 mg/m2 = 920 mg (100 % of original dose 400 mg/m2), Intravenous,  Once, 2 of 4 cycles Dose modification: 400 mg/m2 (original dose 400 mg/m2, Cycle 1, Reason: Provider Judgment) Administration: 920 mg (05/09/2020), 920 mg (05/31/2020) riTUXimab-pvvr (RUXIENCE) 900 mg in sodium  chloride 0.9 % 250 mL (2.6471 mg/mL) infusion, 375 mg/m2 = 900 mg, Intravenous,  Once, 2 of 4 cycles Administration: 900 mg (05/09/2020), 900 mg (05/31/2020)  for chemotherapy treatment.     PHYSICAL EXAMINATION:  Vitals:   06/10/20 0456 06/10/20 0733  BP: (!) 117/54   Pulse: 78   Resp: 19   Temp: 98.3 F (36.8 C)   SpO2: 94% 94%   Filed Weights   06/02/20 1511 06/03/20 0958 06/03/20 2000  Weight: 255 lb (115.7 kg) 267 lb 12.8 oz (121.5 kg) 267 lb 13.7 oz (121.5 kg)    Intake/Output from previous day: 11/07 0701 - 11/08 0700 In: 300 [P.O.:300] Out: 500 [Urine:500]  OROPHARYNX: No thrush or mucositis.   ABDOMEN: abdomen soft, non-tender and normal bowel sounds Vascular: No leg edema NEURO: Lethargic, arousable with sternal rub, opens eyes and says a few words.  The pupils are small. Musculoskeletal: No pain with motion at the left leg  LABORATORY DATA:  I have reviewed the data as listed CMP Latest Ref Rng & Units 06/10/2020 06/08/2020 06/05/2020  Glucose 70 - 99 mg/dL 96 86 105(H)  BUN 8 - 23 mg/dL 51(H) 56(H) 48(H)  Creatinine 0.44 - 1.00 mg/dL 1.48(H) 1.88(H) 1.99(H)  Sodium 135 - 145 mmol/L 136 137 137  Potassium 3.5 - 5.1 mmol/L 4.3 4.4 4.0  Chloride 98 - 111 mmol/L 103 103 105  CO2 22 - 32 mmol/L 24 20(L) 21(L)  Calcium 8.9 - 10.3 mg/dL 8.9 9.0 8.7(L)  Total Protein 6.5 - 8.1 g/dL - - -  Total Bilirubin 0.3 - 1.2 mg/dL - - -  Alkaline Phos 38 - 126 U/L - - -  AST  15 - 41 U/L - - -  ALT 0 - 44 U/L - - -    Lab Results  Component Value Date   WBC 2.6 (L) 06/10/2020   HGB 7.6 (L) 06/10/2020   HCT 24.8 (L) 06/10/2020   MCV 94.3 06/10/2020   PLT 294 06/10/2020   NEUTROABS 0.8 (L) 06/10/2020    CT Abdomen Pelvis Wo Contrast  Result Date: 06/02/2020 CLINICAL DATA:  Abdominal pain and fever. EXAM: CT ABDOMEN AND PELVIS WITHOUT CONTRAST TECHNIQUE: Multidetector CT imaging of the abdomen and pelvis was performed following the standard protocol without IV  contrast. COMPARISON:  CT dated January 03, 2020 FINDINGS: Lower chest: Multiple airspace opacities are noted at the lung bases bilaterally. There is associated bronchial wall thickening and mucous plugging. The heart size is normal. There are coronary artery calcifications.The heart size is normal. There is a trace left-sided pleural effusion. Hepatobiliary: The liver is normal. There is cholelithiasis with questionable mild gallbladder wall thickening.There is no biliary ductal dilation. Pancreas: Normal contours without ductal dilatation. No peripancreatic fluid collection. Spleen: The spleen is borderline enlarged measuring approximately 12 cm craniocaudad. Adrenals/Urinary Tract: --Adrenal glands: The right adrenal gland is unremarkable. There is a left adrenal myelolipoma that is stable. --Right kidney/ureter: Th there is a stable small exophytic proteinaceous or hemorrhagic cyst arising from the lower pole the right kidney. There is no right-sided hydronephrosis. --Left kidney/ureter: There is no left-sided hydronephrosis. The left kidney is somewhat atrophic with areas of cortical thinning/scarring. --Urinary bladder: Unremarkable. Stomach/Bowel: --Stomach/Duodenum: No hiatal hernia or other gastric abnormality. Normal duodenal course and caliber. --Small bowel: Unremarkable. --Colon: There is scattered colonic diverticula without CT evidence for diverticulitis. There is a large amount of stool throughout the colon. --Appendix: Normal. Vascular/Lymphatic: Atherosclerotic calcification is present within the non-aneurysmal abdominal aorta, without hemodynamically significant stenosis. --No retroperitoneal lymphadenopathy. --No mesenteric lymphadenopathy. --there is a pathologically enlarged right inguinal lymph node measuring approximately 6 by 3.7 cm (axial series 2, image 71). Reproductive: Status post hysterectomy. No adnexal mass. Other: No ascites or free air. The abdominal wall is normal. Musculoskeletal.  There are innumerable lytic lesions throughout the patient's pelvis. The largest is located in the left posterior iliac crest and measures approximately 3.7 cm (axial series 2, image 59). There are small lytic lesions located in the proximal left femur that correspond well with the recent x-ray. There is a stable rounded hyperdense lesion within the marrow cavity of the proximal left femur. This is favored to be benign given its stability. There is a probable nondisplaced zone 1 fracture through the right sacrum (axial series 2, image 54). There is a probable pathologic zone 1 fracture through the left inferior sacrum (axial series 2, image 66). There is transitional vertebral anatomy with 6 lumbar type vertebral bodies. There is an old compression fracture of the L4 vertebral body. There is a new compression fracture of the L5 vertebral body resulting in approximately 50% height loss. There are lytic lesions throughout the visualized ribs. IMPRESSION: 1. Innumerable lytic lesions throughout the pelvic bones and ribs consistent with osseous metastatic disease. 2. Probable non displaced pathologic and nonpathologic fractures of the sacrum bilaterally. 3. Pathologically enlarged 6 cm right inguinal lymph node. This is amenable to percutaneous ultrasound-guided biopsy if clinically indicated. 4. Bibasilar airspace disease concerning for multifocal pneumonia. There is a trace left-sided pleural effusion. 5. Cholelithiasis with mild gallbladder wall thickening. Correlation with ultrasound is recommended. 6. New compression fracture of the L5 vertebral body with approximately 50%  height loss. There is an old L4 compression fracture. There is transitional vertebral anatomy as detailed above. 7. Diverticulosis without CT evidence for diverticulitis. 8. Borderline splenomegaly Aortic Atherosclerosis (ICD10-I70.0). Electronically Signed   By: [name] M.D.   On: 06/02/2020 20:43   DG Chest Port 1 View  Result  Date: 06/02/2020 CLINICAL DATA:  Sepsis. EXAM: PORTABLE CHEST 1 VIEW COMPARISON:  09/13/2019 FINDINGS: Heart size is unremarkable. Aortic calcifications are noted. There is atelectasis at the lung bases. No definite large focal infiltrate. No pneumothorax or pleural effusion. Degenerative changes are noted of both glenohumeral joints. IMPRESSION: No active disease. Electronically Signed   By: [name] M.D.   On: 06/02/2020 16:36   DG Hip Unilat W or W/O Pelvis 1 View Left  Result Date: 05/17/2020 CLINICAL DATA:  History of lymphoma. Hypermetabolic lesion in the left iliac crest on prior PET CT scan. EXAM: DG HIP (WITH OR WITHOUT PELVIS) 1V*L* COMPARISON:  PET CT scan 03/27/2019 and CT abdomen and pelvis 01/03/2020. FINDINGS: This examination is limited as the patient was unable to stand or lie on the table for imaging. There is subtle lucency in the anterior right ilium compatible with the lesion seen on the prior studies. No other abnormality is identified. IMPRESSION: Markedly limited study demonstrating a lucent lesion in the right ilium worrisome for a lymphomatous deposit as seen on the prior studies. Electronically Signed   By: Inge Rise M.D.   On: 05/17/2020 09:17   DG Hip Unilat W or Wo Pelvis 2-3 Views Left  Result Date: 06/02/2020 CLINICAL DATA:  Pain EXAM: DG HIP (WITH OR WITHOUT PELVIS) 2-3V LEFT COMPARISON:  05/15/2020.  CT dated January 03, 2020 FINDINGS: There are degenerative changes of both hips, left worse than right. Evaluation for fractures is limited by patient body habitus. There are increasing lucencies in the proximal left femur when compared to prior studies. IMPRESSION: 1. Evaluation limited by patient body habitus. 2. No definite acute displaced fracture or dislocation. 3. Increasing lucencies in the proximal left femur raises concern for an underlying lesion. Follow-up with a contrast-enhanced MRI is recommended. Electronically Signed   By: [name] M.D.    On: 06/02/2020 16:35    ASSESSMENT AND PLAN: 1. 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 Treated with multiple systemic therapies including pentostatin/Cytoxan/rituximab in 2010.   Cycle 1 bendamustine/Rituxan 05/31/2014.  Cycle 2 bendamustine/Rituxan 07/02/2014  Cycle 3 bendamustine/rituximab 07/30/2014  Cycle 4 bendamustine/rituximab 09/10/2014  Cycle 5 bendamustine/rituximab 10/22/2014  Cycle 6 bendamustine/rituximab 12/10/2014  PET scan 03/27/2019-diffuse small hypermetabolic lymph nodes in the neck, chest, abdomen, and pelvis, heterogenous hypermetabolism in the bones  CT chest 10/02/2019-bronchiectasis at the lung bases, 13 mm nodular area at the medial left hemidiaphragm, mildly enlarged mediastinal nodes-decreased from June 2020, slightly enlarged left axillary node-9 mm  Rising [name] level March and April 2021  Cycle 1 bendamustine/rituximab 11/27/2019  Cycle 2 bendamustine/Rituxan 12/26/2019  01/22/2020 [name] 4225 (stable)   Cycle 1 cyclophosphamide, pentostatin, rituximab 05/09/2020  Cycle 2 cyclophosphamide, pentostatin, rituximab 05/31/2020  Xray left hip 06/02/2020-no definite acute displaced fracture or disolcation; increasing lucencies in the proximal left femur  CT abdomen and pelvis 06/02/2020-innumerable lytic lesions throughout the pelvic bones and ribs; probable non displaced pathologic and nonpathologic fractures of the sacrum bilaterally; 6 cm right inguinal lymph node; bilateral airspace disease; new  compression fracture L5; borderline splenomegaly 2. Left knee arthritis. Followed by Dr. Wynelle Link. 3. Sleep apnea. 4. Anemia. microcytic with peripheral blood smear findings consistent with iron deficiency 07/09/2015, serum iron studies consistent with "anemia of chronic disease ",  stool Hemoccult cards negative 07/11/2015 5. Question left parotitis 12/12/2013 presenting with left facial and parotid edema. 6. Admission with pneumonia February 2016 7. History of Neutropenia secondary to chemotherapy, now with moderate neutropenia 8. Left lung pneumonia 04/02/2015, sputum culture positive for Moraxella 06/19/2015 9. Low IgG and IgA 10. CT chest 05/13/2015 with bilateral lower lung consolidation 11. CT chest, abdomen, and pelvis 01/08/2016-new peribronchial thickening and peripheral tree in bud pattern at the right lower lobe, no lymphadenopathy 12. Sinus mucosal thickening/opacification on CT 08/16/2015-status post sinus surgery 12/05/2015 13. Sputum culture positive for pseudomonas aeruginosa on 01/10/2016-treated with 10 days of ciprofloxacin 14. 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 Admission with pneumonia 08/26/2016 16. Left lung nodule increased in size on a CT 01/06/2019, not hypermetabolic on the PET scan 10/30/1914, followed by Dr. Elsworth Soho 17.Admission with rectal bleeding 01/02/2020-diverticular? 18.  Pain at the right arm and left iliac-likely related to lymphoma, plain x-ray 05/07/2020 at [facility] revealed destructive changes in the right humerus and radius, x-ray of the pelvis 05/15/2020-subtle lucent lesion in the right ilium 19.  Hospital admission 06/02/2020 -pneumonia, AKI, anemia 20.  Pain secondary to lymphoma involving the pelvis and pelvic fractures-initiation of palliative radiation 06/06/2020  Natasha Ellis is very lethargic this morning.  The Duragesic patch was discontinued over the weekend and OxyContin was added.  It is possible this accounts for the altered mental status this morning.  I discussed the case with the floor RN.  She has not received oxycodone or anxiolytics during the night.  I will discontinue the OxyContin and decrease the oxycodone dose to 5 mg.  Gabapentin may also be contributing.  Though she was very lethargic  she did not have significant pain when I move the left leg this morning.  She has persistent mild neutropenia, stable over the past several days.  She needs to increase her activity level in order to determine the ability for her to return to the [place] home nursing unit.  Recommendations: 1.  Discontinue OxyContin and gabapentin 2.  Continue palliative radiation 3.  Increase ambulation as possible 4.  Limit blood draws.  PRBC transfusion for hemoglobin less than 7.    LOS: 6 days   [name] 06/10/20

## 2020-06-11 ENCOUNTER — Inpatient Hospital Stay (HOSPITAL_COMMUNITY): Payer: Medicare Other

## 2020-06-11 ENCOUNTER — Ambulatory Visit
Admit: 2020-06-11 | Discharge: 2020-06-11 | Disposition: A | Discharge status: Home / Self Care | Payer: Medicare Other | Attending: Radiation Oncology | Admitting: Radiation Oncology

## 2020-06-11 DIAGNOSIS — J189 Pneumonia, unspecified organism: Secondary | ICD-10-CM | POA: Diagnosis not present

## 2020-06-11 LAB — HEMOGLOBIN AND HEMATOCRIT, BLOOD
HCT: 23.2 % — ABNORMAL LOW (ref 36.0–46.0)
Hemoglobin: 7.4 g/dL — ABNORMAL LOW (ref 12.0–15.0)

## 2020-06-11 MED ORDER — OXYCODONE HCL 5 MG PO TABS: 5.0000 mg | ORAL_TABLET | ORAL | 0 refills | Status: DC | PRN

## 2020-06-11 NOTE — Progress Notes (Signed)
[facility] Therapy Treatment Record Phone [phone]   Radiation Therapy was administered to Angelena Sole on: 06/11/2020  12:14 PM and was treatment # 4 out of a planned course of 5 treatments.  Radiation Treatment  1). Beam photons with 6-10 energy and Photons 10-19 MeV  2). Brachytherapy None  3). Stereotactic Radiosurgery None  4). Other Radiation None     Shawnay Bramel, Leronda Lewers, RT (T)

## 2020-06-11 NOTE — Progress Notes (Signed)
Physical Therapy Treatment Patient Details Name: Natasha Ellis MRN: [id number] DOB: 01-Oct-1930 Today's Date: 06/11/2020    History of Present Illness Natasha Ellis was admitted to the hospital with a diagnosis of multifocal pneumonia, complicated with painful bony metastatic lesions of sacrum, ribs, L proximal femur,pelvis, L5 compression fracture, and atrial fibrillation with RVR.  Scheduled for radiation for pain management.    PT Comments    The patient  Was premedicated  With IV medication. Patient  Tolerated active exercises with both LE's and tolerated sitting  Upright using the bed feature to place in a partial chair position. Patient tolerated sitting  Upright at ~ 50* for about 7 minutes, able to pull trunk forward using the  Rails x 3 and held trunk forward for ~ 30 seconds. Continue progressive  Mobility as tolerated.  Follow Up Recommendations   SNF     Equipment Recommendations    none  Recommendations for Other Services       Precautions / Restrictions Precautions Precautions: Fall Precaution Comments: Pain Restrictions Other Position/Activity Restrictions: unsure of any WB restrictions with multiple bony lesions, doubt patient can tolerate any WB    Mobility  Bed Mobility Overal bed mobility: Needs Assistance             General bed mobility comments: bed placed in chair position very slowly, patient tolerated  position x 7 minutes/ Paient able to pull on the rails and  sit upright for brief periods without bed support on bec. Patient with gradual increase in complaints of pain as she sat up. Returned bed to more  supine with HOB at ! 40*.  Transfers                 General transfer comment: NT  Ambulation/Gait                 Stairs             Wheelchair Mobility    Modified Rankin (Stroke Patients Only)       Balance                                            Cognition Arousal/Alertness:  Lethargic;Suspect due to medications Behavior During Therapy: [facility] for tasks assessed/performed;Anxious                                          Exercises      General Comments        Pertinent Vitals/Pain Pain Assessment: Faces Faces Pain Scale: Hurts little more Pain Location: back after sitting with HOB raised to ~ 50* x 7 minutes Pain Descriptors / Indicators: Discomfort;Grimacing;Guarding Pain Intervention(s): Repositioned;Premedicated before session    Home Living                      Prior Function            PT Goals (current goals can now be found in the care plan section) Progress towards PT goals: Progressing toward goals    Frequency           PT Plan      Co-evaluation              AM-PAC PT "6 Clicks" Mobility  Outcome Measure  Help needed turning from your back to your side while in a flat bed without using bedrails?: Total Help needed moving from lying on your back to sitting on the side of a flat bed without using bedrails?: Total Help needed moving to and from a bed to a chair (including a wheelchair)?: Total Help needed standing up from a chair using your arms (e.g., wheelchair or bedside chair)?: Total Help needed to walk in hospital room?: Total Help needed climbing 3-5 steps with a railing? : Total 6 Click Score: 6    End of Session   Activity Tolerance: Patient tolerated treatment well           Time: 1436-1500 PT Time Calculation (min) (ACUTE ONLY): 24 min  Charges:  $Therapeutic Activity: 23-37 mins                     [place] Pager [phone] Office [phone]    [name] 06/11/2020, 4:51 PM

## 2020-06-11 NOTE — Discharge Summary (Addendum)
Physician Discharge Summary  Natasha Ellis CHE:[id number] DOB: 1931-02-10 DOA: 06/02/2020  PCP: Lavone Orn, MD  Admit date: 06/02/2020 Discharge date: 06/11/2020  Admitted From: SNF Disposition:  snf  Recommendations for Outpatient Follow-up and new medication changes:  1. Follow up with Dr. Laurann Montana in 7 days.  2. Follow up with Dr Benay Spice as scheduled.  3. Plan to discharge patient to SNF on 06/12/20 after radiation therapy.  4. Follow with palliative care as outpatient.   I spoke with patient's daughter at the bedside, we talked in detail about patient's condition, plan of care and prognosis and all questions were addressed.   Home Health: no   Equipment/Devices: na    Discharge Condition: stable  CODE STATUS: dnr   Diet recommendation:  Heart healthy   Brief/Interim Summary: Natasha Ellis was admitted to the hospital with a working diagnosis of multifocal pneumonia, complicated with painful bony metastatic lesions and atrial fibrillation with RVR.  86 year old female with past medical history for Waledestrom's,non-Hodgkin's lymphoma, known metastatic disease and bone marrow infiltration. Currently on chemotherapy. Last cycle 2 days prior to hospitalization. At home she developed chills, generalized malaise, fatigue, and cough. On her initial physical examination blood pressure 116/64, heart rate 97, respiratory rate 35, oxygen saturation 92%, moist mucous membranes, lungs with no wheezing or rhonchi, heart S1-S2, present and rhythmic, soft abdomen, no lower extremity edema Sodium 138, potassium 4.7, chloride 102, bicarb 22, glucose 111, BUN 41, creatinine 2.0, white count 9.1, hemoglobin 7.3, hematocrit 21.5, platelets 534. SARS COVID-19 negative. Urinalysis specific gravity 1.015, 6-10 white cells.  Chest radiograph with bibasilar infiltrates, more prominent on the right. CT of the abdomen with bibasilar infiltrates, on the left retrocardiac. Innumerable lytic lesions  throughout the pelvic bones and ribs consistent with osseous metastatic disease. Nondisplaced pathologic and not pathologic fractures of the sacrum bilaterally. Pathologic enlarged 6 cm right inguinal lymph node. Cholelithiasis with mild gallbladder wall thickening. New compression fracture L5 vertebral body with approximately 50% height loss. Old L4 compression fracture.  Patient received full course of antibiotic therapy for pneumonia with good toleration. She reported continuos significant pain related to bony metastasis and fractures.  Pain initially refractive to analgesics. Radiation oncology was consulted and palliative radiation to bony mets to her hip was started.   Pain has been very severe and has limited her ability to move.  Slowly her symptoms have been improving, but continue to be very weak and deconditioned, plan to return to SNF after completing radiation therapy  1.  Bilateral bibasilar community-acquired pneumonia, present on admission, complicated with acute hypoxemic respiratory failure.  Patient received medical therapy with ceftriaxone and azithromycin with good toleration.  She completed 5-day course. Her oxygenation has been stable, currently using 2 L per nasal cannula with oxygen saturation of 97%.  2.  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  Waldenstrm macroglobulinemia. Salvage systemic therapy for WM 05/31/2020,#2 cycle. Lytic bony lesions likely related to lymphoma.   Patient developed significant pain at the hip region on the left, her mobility was limited. Pain was persistent despite analgesic regimen, radiation oncology was consulted and she received palliative radiation to the left hip lytic region with good toleration. She completed 5 cycles of radiation therapy during her hospitalization.  She will be discharge with oral analgesics and continue physical  therapy as tolerated.  3.  Acute kidney injury on chronic kidney disease stage IIIa. Patient received supportive medical therapy with improvement  on her renal function. Po intake has been improving.  4.  Chronic anemia of malignancy, iron deficiency.  Iron 34, TIBC 120, transferrin saturation 28, ferritin 2152, transferrin 86. Patient did receive 2 units packed red blood cells during her hospitalization. Her discharge hemoglobin 7.4, hematocrit 23.2.  5.  Paroxysmal atrial fibrillation with rapid ventricular response.  She required intravenous diltiazem infusion for rate control.  Eventually she was transitioned to oral metoprolol and diltiazem with good toleration. Continue anticoagulation with apixaban.  6.  COPD.  Continue bronchodilator therapy and inhaled corticosteroids.  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   Pneumonia    Discharge Instructions   Allergies as of 06/11/2020      Reactions   Codeine Other (See Comments)   Crazy-nightmares   Amoxicillin Diarrhea      Cefdinir Cough   Doxycycline Diarrhea   Erythromycin Other (See Comments)   GI upset   Lisinopril Cough   Oxybutynin Other (See Comments)   Dry mouth   Sulfa Antibiotics Other (See Comments)   GI upset      Medication List    STOP taking these medications   fentaNYL 25 MCG/HR Commonly known as: DURAGESIC   HYDROcodone-acetaminophen 7.5-325 MG tablet Commonly known as: NORCO   ibuprofen 200 MG tablet Commonly known as: ADVIL   oxyCODONE-acetaminophen 7.5-325 MG tablet Commonly known as: PERCOCET     TAKE these medications   acetaminophen 325 MG tablet Commonly known  as: TYLENOL Take 650 mg by mouth every 6 (six) hours as needed for mild pain.   albuterol (2.5 MG/3ML) 0.083% nebulizer solution Commonly known as: PROVENTIL Take 2.5 mg by nebulization in the morning and at bedtime.   albuterol 108 (90 Base) MCG/ACT inhaler Commonly known as: VENTOLIN HFA Inhale 2 puffs into the lungs every 6 (six) hours as needed for wheezing or shortness of breath.   allopurinol 100 MG tablet Commonly known as: ZYLOPRIM Take 1 tablet (100 mg total) by mouth daily. What changed: when to take this   apixaban 2.5 MG Tabs tablet Commonly known as: Eliquis Take 1 tablet (2.5 mg total) by mouth 2 (two) times daily.   Breo Ellipta 200-25 MCG/INH Aepb Generic drug: fluticasone furoate-vilanterol Inhale 1 puff into the lungs daily.   calcium carbonate 500 MG chewable tablet Commonly known as: TUMS - dosed in mg elemental calcium Chew 2 tablets by mouth 3 (three) times daily as needed for indigestion or heartburn.   calcium-vitamin D 500-200 MG-UNIT tablet Commonly known as: OSCAL WITH D Take 1 tablet by mouth daily with breakfast.   cholestyramine 4 g packet Commonly known as: QUESTRAN Take 4 g by mouth daily as needed (constipation).   Delsym 30 MG/5ML liquid Generic drug: dextromethorphan Take 60 mg by mouth 2 (two) times daily as needed for cough.   diltiazem 180 MG 24 hr capsule Commonly known as: CARDIZEM CD Take 1 capsule (180 mg total) by mouth daily. Please make yearly appt with Dr. Radford Pax in August for future refills. 1st attempt   docusate sodium 100 MG capsule Commonly known as: COLACE Take 100 mg by mouth daily.   esomeprazole 40 MG capsule Commonly known as: NEXIUM Take 40  mg by mouth See admin instructions. Take 40mg  daily and may take 40mg  daily as needed for acid reflux.   ferrous sulfate 325 (65 FE) MG tablet Take 325 mg by mouth daily.   fluticasone 50 MCG/ACT nasal spray Commonly known as: FLONASE Place 1 spray into both nostrils  2 (two) times daily as needed for allergies or rhinitis.   furosemide 40 MG tablet Commonly known as: LASIX Take 40 mg by mouth See admin instructions. Was taking 40mg  daily. On 06/01/20 it was changed to 40mg  BID for 3 days due to ankle edema.   gabapentin 100 MG capsule Commonly known as: NEURONTIN Take 100 mg by mouth 3 (three) times daily.   lactose free nutrition Liqd Take 237 mLs by mouth 2 (two) times daily between meals.   MiraLax 17 GM/SCOOP powder Generic drug: polyethylene glycol powder Take 17 g by mouth daily as needed (constipation).   Mucinex 600 MG 12 hr tablet Generic drug: guaiFENesin Take 600 mg by mouth 2 (two) times daily.   naloxone 0.4 MG/ML injection Commonly known as: NARCAN Inject 0.4 mg into the muscle as needed (opioid overdose).   oxyCODONE 5 MG immediate release tablet Commonly known as: Oxy IR/ROXICODONE Take 1 tablet (5 mg total) by mouth every 4 (four) hours as needed for severe pain.   potassium chloride 10 MEQ tablet Commonly known as: KLOR-CON Take 10 mEq by mouth daily.   pramoxine-hydrocortisone cream Commonly known as: ANALPRAM HC Apply 1 application topically daily as needed (pain, itching).   Voltaren 1 % Gel Generic drug: diclofenac Sodium Apply 1 application topically at bedtime.       Allergies  Allergen Reactions  . Codeine Other (See Comments)    Crazy-nightmares  . Amoxicillin Diarrhea        . Cefdinir Cough  . Doxycycline Diarrhea  . Erythromycin Other (See Comments)    GI upset  . Lisinopril Cough  . Oxybutynin Other (See Comments)    Dry mouth  . Sulfa Antibiotics Other (See Comments)    GI upset    Consultations:  Oncology   Radiation oncology    Procedures/Studies: CT Abdomen Pelvis Wo Contrast  Result Date: 06/02/2020 CLINICAL DATA:  Abdominal pain and fever. EXAM: CT ABDOMEN AND PELVIS WITHOUT CONTRAST TECHNIQUE: Multidetector CT imaging of the abdomen and pelvis was performed following  the standard protocol without IV contrast. COMPARISON:  CT dated January 03, 2020 FINDINGS: Lower chest: Multiple airspace opacities are noted at the lung bases bilaterally. There is associated bronchial wall thickening and mucous plugging. The heart size is normal. There are coronary artery calcifications.The heart size is normal. There is a trace left-sided pleural effusion. Hepatobiliary: The liver is normal. There is cholelithiasis with questionable mild gallbladder wall thickening.There is no biliary ductal dilation. Pancreas: Normal contours without ductal dilatation. No peripancreatic fluid collection. Spleen: The spleen is borderline enlarged measuring approximately 12 cm craniocaudad. Adrenals/Urinary Tract: --Adrenal glands: The right adrenal gland is unremarkable. There is a left adrenal myelolipoma that is stable. --Right kidney/ureter: Th there is a stable small exophytic proteinaceous or hemorrhagic cyst arising from the lower pole the right kidney. There is no right-sided hydronephrosis. --Left kidney/ureter: There is no left-sided hydronephrosis. The left kidney is somewhat atrophic with areas of cortical thinning/scarring. --Urinary bladder: Unremarkable. Stomach/Bowel: --Stomach/Duodenum: No hiatal hernia or other gastric abnormality. Normal duodenal course and caliber. --Small bowel: Unremarkable. --Colon: There is scattered colonic diverticula without CT evidence for diverticulitis. There is a large amount of stool throughout  the colon. --Appendix: Normal. Vascular/Lymphatic: Atherosclerotic calcification is present within the non-aneurysmal abdominal aorta, without hemodynamically significant stenosis. --No retroperitoneal lymphadenopathy. --No mesenteric lymphadenopathy. --there is a pathologically enlarged right inguinal lymph node measuring approximately 6 by 3.7 cm (axial series 2, image 71). Reproductive: Status post hysterectomy. No adnexal mass. Other: No ascites or free air. The abdominal  wall is normal. Musculoskeletal. There are innumerable lytic lesions throughout the patient's pelvis. The largest is located in the left posterior iliac crest and measures approximately 3.7 cm (axial series 2, image 59). There are small lytic lesions located in the proximal left femur that correspond well with the recent x-ray. There is a stable rounded hyperdense lesion within the marrow cavity of the proximal left femur. This is favored to be benign given its stability. There is a probable nondisplaced zone 1 fracture through the right sacrum (axial series 2, image 54). There is a probable pathologic zone 1 fracture through the left inferior sacrum (axial series 2, image 66). There is transitional vertebral anatomy with 6 lumbar type vertebral bodies. There is an old compression fracture of the L4 vertebral body. There is a new compression fracture of the L5 vertebral body resulting in approximately 50% height loss. There are lytic lesions throughout the visualized ribs. IMPRESSION: 1. Innumerable lytic lesions throughout the pelvic bones and ribs consistent with osseous metastatic disease. 2. Probable non displaced pathologic and nonpathologic fractures of the sacrum bilaterally. 3. Pathologically enlarged 6 cm right inguinal lymph node. This is amenable to percutaneous ultrasound-guided biopsy if clinically indicated. 4. Bibasilar airspace disease concerning for multifocal pneumonia. There is a trace left-sided pleural effusion. 5. Cholelithiasis with mild gallbladder wall thickening. Correlation with ultrasound is recommended. 6. New compression fracture of the L5 vertebral body with approximately 50% height loss. There is an old L4 compression fracture. There is transitional vertebral anatomy as detailed above. 7. Diverticulosis without CT evidence for diverticulitis. 8. Borderline splenomegaly Aortic Atherosclerosis (ICD10-I70.0). Electronically Signed   By: [name] M.D.   On: 06/02/2020 20:43    DG Chest Port 1 View  Result Date: 06/02/2020 CLINICAL DATA:  Sepsis. EXAM: PORTABLE CHEST 1 VIEW COMPARISON:  09/13/2019 FINDINGS: Heart size is unremarkable. Aortic calcifications are noted. There is atelectasis at the lung bases. No definite large focal infiltrate. No pneumothorax or pleural effusion. Degenerative changes are noted of both glenohumeral joints. IMPRESSION: No active disease. Electronically Signed   By: [name] M.D.   On: 06/02/2020 16:36   DG Hip Unilat W or W/O Pelvis 1 View Left  Result Date: 05/17/2020 CLINICAL DATA:  History of lymphoma. Hypermetabolic lesion in the left iliac crest on prior PET CT scan. EXAM: DG HIP (WITH OR WITHOUT PELVIS) 1V*L* COMPARISON:  PET CT scan 03/27/2019 and CT abdomen and pelvis 01/03/2020. FINDINGS: This examination is limited as the patient was unable to stand or lie on the table for imaging. There is subtle lucency in the anterior right ilium compatible with the lesion seen on the prior studies. No other abnormality is identified. IMPRESSION: Markedly limited study demonstrating a lucent lesion in the right ilium worrisome for a lymphomatous deposit as seen on the prior studies. Electronically Signed   By: Inge Rise M.D.   On: 05/17/2020 09:17   DG Hip Unilat W or Wo Pelvis 2-3 Views Left  Result Date: 06/02/2020 CLINICAL DATA:  Pain EXAM: DG HIP (WITH OR WITHOUT PELVIS) 2-3V LEFT COMPARISON:  05/15/2020.  CT dated January 03, 2020 FINDINGS: There are degenerative changes  of both hips, left worse than right. Evaluation for fractures is limited by patient body habitus. There are increasing lucencies in the proximal left femur when compared to prior studies. IMPRESSION: 1. Evaluation limited by patient body habitus. 2. No definite acute displaced fracture or dislocation. 3. Increasing lucencies in the proximal left femur raises concern for an underlying lesion. Follow-up with a contrast-enhanced MRI is recommended. Electronically  Signed   By: [name] M.D.   On: 06/02/2020 16:35        Subjective: Patient is feeling better, no nausea or vomiting, her pain has improved, no dyspnea or chest pain.   Discharge Exam: Vitals:   06/11/20 1104 06/11/20 1245  BP: (!) 120/51 (!) 147/133  Pulse: 79 77  Resp:  18  Temp:  98.2 F (36.8 C)  SpO2:  97%   Vitals:   06/10/20 2022 06/11/20 0350 06/11/20 1104 06/11/20 1245  BP: (!) 103/56 (!) 115/50 (!) 120/51 (!) 147/133  Pulse: 71 79 79 77  Resp: 19 20  18   Temp: 97.8 F (36.6 C) 98.2 F (36.8 C)  98.2 F (36.8 C)  TempSrc:    Oral  SpO2: 96% 96%  97%  Weight:      Height:        General: deconditioned  Neurology: Awake and alert, non focal  E ENT: mild pallor, no icterus, oral mucosa moist Cardiovascular: No JVD. S1-S2 present, rhythmic, no gallops, rubs, or murmurs. No lower extremity edema. Pulmonary: positive breath sounds bilaterally with no wheezing, rhonchi or rales. Gastrointestinal. Abdomen soft and non tender Skin. No rashes Musculoskeletal: no joint deformities   The results of significant diagnostics from this hospitalization (including imaging, microbiology, ancillary and laboratory) are listed below for reference.     Microbiology: Recent Results (from the past 240 hour(s))  Urine culture     Status: Abnormal   Collection Time: 06/02/20  3:23 PM   Specimen: In/Out Cath Urine  Result Value Ref Range Status   Specimen Description   Final    IN/OUT CATH URINE Performed at [facility], [place] [address] [place], [place] [address]    Special Requests   Final    NONE Performed at [facility], [place] [address], [place], [place] [address]    Culture MULTIPLE SPECIES PRESENT, SUGGEST RECOLLECTION (A)  Final   Report Status 06/03/2020 FINAL  Final  Respiratory Panel by RT PCR (Flu A&B, Covid) - Nasopharyngeal Swab     Status: None   Collection Time: 06/02/20  3:24 PM   Specimen: Nasopharyngeal  Swab  Result Value Ref Range Status   SARS Coronavirus 2 by RT PCR NEGATIVE NEGATIVE Final    Comment: (NOTE) SARS-CoV-2 target nucleic acids are NOT DETECTED.  The SARS-CoV-2 RNA is generally detectable in upper respiratoy specimens during the acute phase of infection. The lowest concentration of SARS-CoV-2 viral copies this assay can detect is 131 copies/mL. A negative result does not preclude SARS-Cov-2 infection and should not be used as the sole basis for treatment or other patient management decisions. A negative result may occur with  improper specimen collection/handling, submission of specimen other than nasopharyngeal swab, presence of viral mutation(s) within the areas targeted by this assay, and inadequate number of viral copies (<131 copies/mL). A negative result must be combined with clinical observations, patient history, and epidemiological information. The expected result is Negative.  Fact Sheet for Patients:  PinkCheek.be  Fact Sheet for Healthcare Providers:  GravelBags.it  This test  is no t yet approved or cleared by the [place] and  has been authorized for detection and/or diagnosis of SARS-CoV-2 by FDA under an Emergency Use Authorization (EUA). This EUA will remain  in effect (meaning this test can be used) for the duration of the COVID-19 declaration under Section 564(b)(1) of the Act, 21 U.S.C. section 360bbb-3(b)(1), unless the authorization is terminated or revoked sooner.     Influenza A by PCR NEGATIVE NEGATIVE Final   Influenza B by PCR NEGATIVE NEGATIVE Final    Comment: (NOTE) The Xpert Xpress SARS-CoV-2/FLU/RSV assay is intended as an aid in  the diagnosis of influenza from Nasopharyngeal swab specimens and  should not be used as a sole basis for treatment. Nasal washings and  aspirates are unacceptable for Xpert Xpress SARS-CoV-2/FLU/RSV  testing.  Fact Sheet for  Patients: PinkCheek.be  Fact Sheet for Healthcare Providers: GravelBags.it  This test is not yet approved or cleared by the [place] FDA and  has been authorized for detection and/or diagnosis of SARS-CoV-2 by  FDA under an Emergency Use Authorization (EUA). This EUA will remain  in effect (meaning this test can be used) for the duration of the  Covid-19 declaration under Section 564(b)(1) of the Act, 21  U.S.C. section 360bbb-3(b)(1), unless the authorization is  terminated or revoked. Performed at [facility], [place] [address] [place], [place] [address]   Blood Culture (routine x 2)     Status: None   Collection Time: 06/02/20  3:28 PM   Specimen: BLOOD  Result Value Ref Range Status   Specimen Description   Final    BLOOD LEFT ANTECUBITAL Performed at [place] [address], [place], [place] [address]    Special Requests   Final    BOTTLES DRAWN AEROBIC AND ANAEROBIC Blood Culture adequate volume Performed at [place] [address] [place], [place] [address]    Culture   Final    NO GROWTH 5 DAYS Performed at [facility], [place] [address], [place], [place] [address]    Report Status 06/07/2020 FINAL  Final  Culture, blood (Routine X 2) w Reflex to ID Panel     Status: None   Collection Time: 06/02/20  3:52 PM   Specimen: BLOOD  Result Value Ref Range Status   Specimen Description   Final    BLOOD LEFT ANTECUBITAL Performed at [place] [address], [place], [place] [address]    Special Requests   Final    BOTTLES DRAWN AEROBIC AND ANAEROBIC Blood Culture results may not be optimal due to an inadequate volume of blood received in culture bottles Performed at [place] [address] [place], [place] [address]    Culture   Final    NO GROWTH 5 DAYS Performed at [facility],  [place] [address], [place], [place] [address]    Report Status 06/08/2020 FINAL  Final  MRSA PCR Screening     Status: None   Collection Time: 06/03/20  7:54 PM   Specimen: Nasal Mucosa; Nasopharyngeal  Result Value Ref Range Status   MRSA by PCR NEGATIVE NEGATIVE Final    Comment:        The GeneXpert MRSA Assay (FDA approved for NASAL specimens only), is one component of a comprehensive MRSA colonization surveillance program. It is not intended to diagnose MRSA infection nor to guide or monitor treatment for MRSA infections. Performed at [facility]  [place] [address] [place], [place] [address]      Labs: BNP (last 3 results) No results for input(s): BNP in the last 8760 hours. Basic Metabolic Panel: Recent Labs  Lab 06/05/20 0355 06/08/20 0452 06/10/20 0336  NA 137 137 136  K 4.0 4.4 4.3  CL 105 103 103  CO2 21* 20* 24  GLUCOSE 105* 86 96  BUN 48* 56* 51*  CREATININE 1.99* 1.88* 1.48*  CALCIUM 8.7* 9.0 8.9   Liver Function Tests: No results for input(s): AST, ALT, ALKPHOS, BILITOT, PROT, ALBUMIN in the last 168 hours. No results for input(s): LIPASE, AMYLASE in the last 168 hours. No results for input(s): AMMONIA in the last 168 hours. CBC: Recent Labs  Lab 06/05/20 0355 06/07/20 0347 06/09/20 0517 06/10/20 0336 06/11/20 0419  WBC 3.2* 2.0* 2.1* 2.6*  --   NEUTROABS 2.3 0.8* 0.8* 0.8*  --   HGB 7.5* 8.1* 7.8* 7.6* 7.4*  HCT 22.3* 23.5* 22.3* 24.8* 23.2*  MCV 94.9 97.5 99.6 94.3  --   PLT 354 347 291 294  --    Cardiac Enzymes: No results for input(s): CKTOTAL, CKMB, CKMBINDEX, TROPONINI in the last 168 hours. BNP: Invalid input(s): POCBNP CBG: No results for input(s): GLUCAP in the last 168 hours. D-Dimer No results for input(s): DDIMER in the last 72 hours. Hgb A1c No results for input(s): HGBA1C in the last 72 hours. Lipid Profile No results for input(s): CHOL, HDL, LDLCALC, TRIG, CHOLHDL, LDLDIRECT in the last 72 hours. Thyroid  function studies No results for input(s): TSH, T4TOTAL, T3FREE, THYROIDAB in the last 72 hours.  Invalid input(s): FREET3 Anemia work up No results for input(s): VITAMINB12, FOLATE, FERRITIN, TIBC, IRON, RETICCTPCT in the last 72 hours. Urinalysis    Component Value Date/Time   COLORURINE YELLOW 06/02/2020 1523   APPEARANCEUR HAZY (A) 06/02/2020 1523   LABSPEC 1.015 06/02/2020 1523   PHURINE 5.0 06/02/2020 1523   GLUCOSEU NEGATIVE 06/02/2020 1523   HGBUR NEGATIVE 06/02/2020 1523   BILIRUBINUR NEGATIVE 06/02/2020 1523   BILIRUBINUR neg 04/20/2013 1728   KETONESUR NEGATIVE 06/02/2020 1523   PROTEINUR NEGATIVE 06/02/2020 1523   UROBILINOGEN 0.2 05/21/2015 1920   NITRITE NEGATIVE 06/02/2020 1523   LEUKOCYTESUR TRACE (A) 06/02/2020 1523   Sepsis Labs Invalid input(s): PROCALCITONIN,  WBC,  LACTICIDVEN Microbiology Recent Results (from the past 240 hour(s))  Urine culture     Status: Abnormal   Collection Time: 06/02/20  3:23 PM   Specimen: In/Out Cath Urine  Result Value Ref Range Status   Specimen Description   Final    IN/OUT CATH URINE Performed at [facility], [place] [address] [address] [place], [place] [address]    Special Requests   Final    NONE Performed at [facility], [place] [address], [place], [place] [address]    Culture MULTIPLE SPECIES PRESENT, SUGGEST RECOLLECTION (A)  Final   Report Status 06/03/2020 FINAL  Final  Respiratory Panel by RT PCR (Flu A&B, Covid) - Nasopharyngeal Swab     Status: None   Collection Time: 06/02/20  3:24 PM   Specimen: Nasopharyngeal Swab  Result Value Ref Range Status   SARS Coronavirus 2 by RT PCR NEGATIVE NEGATIVE Final    Comment: (NOTE) SARS-CoV-2 target nucleic acids are NOT DETECTED.  The SARS-CoV-2 RNA is generally detectable in upper respiratoy specimens during the acute phase of infection. The lowest concentration of SARS-CoV-2 viral copies this assay can detect is 131 copies/mL. A negative  result does not preclude SARS-Cov-2 infection and should not be used as the sole basis for treatment or other patient management decisions. A negative result may occur with  improper specimen collection/handling, submission of specimen other than nasopharyngeal swab, presence of viral mutation(s) within the areas targeted by this assay, and inadequate number of viral copies (<131 copies/mL). A negative result must be combined with clinical observations, patient history, and epidemiological information. The expected result is Negative.  Fact Sheet for Patients:  PinkCheek.be  Fact Sheet for Healthcare Providers:  GravelBags.it  This test is no t yet approved or cleared by the [place] FDA and  has been authorized for detection and/or diagnosis of SARS-CoV-2 by FDA under an Emergency Use Authorization (EUA). This EUA will remain  in effect (meaning this test can be used) for the duration of the COVID-19 declaration under Section 564(b)(1) of the Act, 21 U.S.C. section 360bbb-3(b)(1), unless the authorization is terminated or revoked sooner.     Influenza A by PCR NEGATIVE NEGATIVE Final   Influenza B by PCR NEGATIVE NEGATIVE Final    Comment: (NOTE) The Xpert Xpress SARS-CoV-2/FLU/RSV assay is intended as an aid in  the diagnosis of influenza from Nasopharyngeal swab specimens and  should not be used as a sole basis for treatment. Nasal washings and  aspirates are unacceptable for Xpert Xpress SARS-CoV-2/FLU/RSV  testing.  Fact Sheet for Patients: PinkCheek.be  Fact Sheet for Healthcare Providers: GravelBags.it  This test is not yet approved or cleared by the [place] FDA and  has been authorized for detection and/or diagnosis of SARS-CoV-2 by  FDA under an Emergency Use Authorization (EUA). This EUA will remain  in effect (meaning this test can be used)  for the duration of the  Covid-19 declaration under Section 564(b)(1) of the Act, 21  U.S.C. section 360bbb-3(b)(1), unless the authorization is  terminated or revoked. Performed at [facility], [place] [address], [place], [place] [address]   Blood Culture (routine x 2)     Status: None   Collection Time: 06/02/20  3:28 PM   Specimen: BLOOD  Result Value Ref Range Status   Specimen Description   Final    BLOOD LEFT ANTECUBITAL Performed at [facility] [place] [facility] [address], [place], [place] [address]    Special Requests   Final    BOTTLES DRAWN AEROBIC AND ANAEROBIC Blood Culture adequate volume Performed at [facility] [address] [place], [place] [address]    Culture   Final    NO GROWTH 5 DAYS Performed at [facility], [place] [address], [place], [place] [address]    Report Status 06/07/2020 FINAL  Final  Culture, blood (Routine X 2) w Reflex to ID Panel     Status: None   Collection Time: 06/02/20  3:52 PM   Specimen: BLOOD  Result Value Ref Range Status   Specimen Description   Final    BLOOD LEFT ANTECUBITAL Performed at [place] [address] [place], [place] [address]    Special Requests   Final    BOTTLES DRAWN AEROBIC AND ANAEROBIC Blood Culture results may not be optimal due to an inadequate volume of blood received in culture bottles Performed at [facility] [address] [place], [place] [address]    Culture   Final    NO GROWTH 5 DAYS Performed at [facility], [place] [address], [place], [place] [address]    Report  Status 06/08/2020 FINAL  Final  MRSA PCR Screening     Status: None   Collection Time: 06/03/20  7:54 PM   Specimen: Nasal Mucosa; Nasopharyngeal  Result Value Ref Range Status   MRSA by PCR NEGATIVE NEGATIVE Final    Comment:        The GeneXpert MRSA Assay (FDA approved for NASAL specimens only), is one component of  a comprehensive MRSA colonization surveillance program. It is not intended to diagnose MRSA infection nor to guide or monitor treatment for MRSA infections. Performed at [facility], [place] [address] [place], [place] [address]      Time coordinating discharge: 45 minutes  SIGNED:   Tawni Millers, MD  [facility] 06/11/2020, 1:33 PM

## 2020-06-11 NOTE — Progress Notes (Signed)
HEMATOLOGY-ONCOLOGY PROGRESS NOTE  SUBJECTIVE: Natasha Ellis is alert this morning.  She complains of pain at the "hip ".  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[place])   11/27/2019 - 12/27/2019 Chemotherapy   The patient had palonosetron (ALOXI) injection 0.25 mg, 0.25 mg, Intravenous,  Once, 2 of 4 cycles Administration: 0.25 mg (11/27/2019), 0.25 mg (12/26/2019) bendamustine (BENDEKA) 175 mg in sodium chloride 0.9 % 50 mL (3.0702 mg/mL) chemo infusion, 70 mg/m2 = 175 mg (100 % of original dose 70 mg/m2), Intravenous,  Once, 2 of 4 cycles Dose modification: 70 mg/m2 (original dose 70 mg/m2, Cycle 1, Reason: Provider Judgment) Administration: 175 mg (11/27/2019), 175 mg (11/28/2019), 175 mg (12/26/2019), 175 mg (12/27/2019) riTUXimab-pvvr (RUXIENCE) 900 mg in sodium chloride 0.9 % 250 mL (2.6471 mg/mL) infusion, 375 mg/m2 = 900 mg, Intravenous,  Once, 2 of 4 cycles Administration: 900 mg (11/27/2019), 900 mg (12/26/2019)  for chemotherapy treatment.    05/09/2020 -  Chemotherapy   The patient had palonosetron (ALOXI) injection 0.25 mg, 0.25 mg, Intravenous,  Once, 2 of 4 cycles Administration: 0.25 mg (05/09/2020), 0.25 mg (05/31/2020) pentostatin (NIPENT) 4.8 mg in sodium chloride 0.9 % 50 mL chemo infusion, 2 mg/m2 = 4.8 mg (100 % of original dose 2 mg/m2), Intravenous,  Once, 2 of 4 cycles Dose modification: 2 mg/m2 (original dose 2 mg/m2, Cycle 1, Reason: Provider Judgment) Administration: 4.8 mg (05/09/2020), 4.8 mg (05/31/2020) cyclophosphamide (CYTOXAN) 920 mg in sodium chloride 0.9 % 250 mL chemo infusion, 400 mg/m2 = 920 mg (100 % of original dose 400 mg/m2), Intravenous,  Once, 2 of 4 cycles Dose modification: 400 mg/m2 (original dose 400 mg/m2, Cycle 1, Reason: Provider Judgment) Administration: 920 mg (05/09/2020), 920 mg (05/31/2020) riTUXimab-pvvr (RUXIENCE) 900 mg in sodium chloride 0.9 % 250 mL (2.6471 mg/mL)  infusion, 375 mg/m2 = 900 mg, Intravenous,  Once, 2 of 4 cycles Administration: 900 mg (05/09/2020), 900 mg (05/31/2020)  for chemotherapy treatment.     PHYSICAL EXAMINATION:  Vitals:   06/11/20 1104 06/11/20 1245  BP: (!) 120/51 (!) 147/133  Pulse: 79 77  Resp:  18  Temp:  98.2 F (36.8 C)  SpO2:  97%   Filed Weights   06/02/20 1511 06/03/20 0958 06/03/20 2000  Weight: 255 lb (115.7 kg) 267 lb 12.8 oz (121.5 kg) 267 lb 13.7 oz (121.5 kg)    Intake/Output from previous day: No intake/output data recorded.  OROPHARYNX: No thrush or mucositis.   ABDOMEN: abdomen soft and nontender Vascular: No leg edema NEURO: Alert, follows commands musculoskeletal: Musculoskeletal: No pain with internal/external rotation of the left hip, pain after several flexions of the left hip  LABORATORY DATA:  I have reviewed the data as listed CMP Latest Ref Rng & Units 06/10/2020 06/08/2020 06/05/2020  Glucose 70 - 99 mg/dL 96 86 105(H)  BUN 8 - 23 mg/dL 51(H) 56(H) 48(H)  Creatinine 0.44 - 1.00 mg/dL 1.48(H) 1.88(H) 1.99(H)  Sodium 135 - 145 mmol/L 136 137 137  Potassium 3.5 - 5.1 mmol/L 4.3 4.4 4.0  Chloride 98 - 111 mmol/L 103 103 105  CO2 22 - 32 mmol/L 24 20(L) 21(L)  Calcium 8.9 - 10.3 mg/dL 8.9 9.0 8.7(L)  Total Protein 6.5 - 8.1 g/dL - - -  Total Bilirubin 0.3 - 1.2 mg/dL - - -  Alkaline Phos 38 - 126 U/L - - -  AST 15 - 41 U/L - - -  ALT 0 - 44 U/L - - -  Lab Results  Component Value Date   WBC 2.6 (L) 06/10/2020   HGB 7.4 (L) 06/11/2020   HCT 23.2 (L) 06/11/2020   MCV 94.3 06/10/2020   PLT 294 06/10/2020   NEUTROABS 0.8 (L) 06/10/2020    CT Abdomen Pelvis Wo Contrast  Result Date: 06/02/2020 CLINICAL DATA:  Abdominal pain and fever. EXAM: CT ABDOMEN AND PELVIS WITHOUT CONTRAST TECHNIQUE: Multidetector CT imaging of the abdomen and pelvis was performed following the standard protocol without IV contrast. COMPARISON:  CT dated January 03, 2020 FINDINGS: Lower chest: Multiple  airspace opacities are noted at the lung bases bilaterally. There is associated bronchial wall thickening and mucous plugging. The heart size is normal. There are coronary artery calcifications.The heart size is normal. There is a trace left-sided pleural effusion. Hepatobiliary: The liver is normal. There is cholelithiasis with questionable mild gallbladder wall thickening.There is no biliary ductal dilation. Pancreas: Normal contours without ductal dilatation. No peripancreatic fluid collection. Spleen: The spleen is borderline enlarged measuring approximately 12 cm craniocaudad. Adrenals/Urinary Tract: --Adrenal glands: The right adrenal gland is unremarkable. There is a left adrenal myelolipoma that is stable. --Right kidney/ureter: Th there is a stable small exophytic proteinaceous or hemorrhagic cyst arising from the lower pole the right kidney. There is no right-sided hydronephrosis. --Left kidney/ureter: There is no left-sided hydronephrosis. The left kidney is somewhat atrophic with areas of cortical thinning/scarring. --Urinary bladder: Unremarkable. Stomach/Bowel: --Stomach/Duodenum: No hiatal hernia or other gastric abnormality. Normal duodenal course and caliber. --Small bowel: Unremarkable. --Colon: There is scattered colonic diverticula without CT evidence for diverticulitis. There is a large amount of stool throughout the colon. --Appendix: Normal. Vascular/Lymphatic: Atherosclerotic calcification is present within the non-aneurysmal abdominal aorta, without hemodynamically significant stenosis. --No retroperitoneal lymphadenopathy. --No mesenteric lymphadenopathy. --there is a pathologically enlarged right inguinal lymph node measuring approximately 6 by 3.7 cm (axial series 2, image 71). Reproductive: Status post hysterectomy. No adnexal mass. Other: No ascites or free air. The abdominal wall is normal. Musculoskeletal. There are innumerable lytic lesions throughout the patient's pelvis. The  largest is located in the left posterior iliac crest and measures approximately 3.7 cm (axial series 2, image 59). There are small lytic lesions located in the proximal left femur that correspond well with the recent x-ray. There is a stable rounded hyperdense lesion within the marrow cavity of the proximal left femur. This is favored to be benign given its stability. There is a probable nondisplaced zone 1 fracture through the right sacrum (axial series 2, image 54). There is a probable pathologic zone 1 fracture through the left inferior sacrum (axial series 2, image 66). There is transitional vertebral anatomy with 6 lumbar type vertebral bodies. There is an old compression fracture of the L4 vertebral body. There is a new compression fracture of the L5 vertebral body resulting in approximately 50% height loss. There are lytic lesions throughout the visualized ribs. IMPRESSION: 1. Innumerable lytic lesions throughout the pelvic bones and ribs consistent with osseous metastatic disease. 2. Probable non displaced pathologic and nonpathologic fractures of the sacrum bilaterally. 3. Pathologically enlarged 6 cm right inguinal lymph node. This is amenable to percutaneous ultrasound-guided biopsy if clinically indicated. 4. Bibasilar airspace disease concerning for multifocal pneumonia. There is a trace left-sided pleural effusion. 5. Cholelithiasis with mild gallbladder wall thickening. Correlation with ultrasound is recommended. 6. New compression fracture of the L5 vertebral body with approximately 50% height loss. There is an old L4 compression fracture. There is transitional vertebral anatomy as detailed above. 7. Diverticulosis  without CT evidence for diverticulitis. 8. Borderline splenomegaly Aortic Atherosclerosis (ICD10-I70.0). Electronically Signed   By: [name] M.D.   On: 06/02/2020 20:43   DG Chest Port 1 View  Result Date: 06/02/2020 CLINICAL DATA:  Sepsis. EXAM: PORTABLE CHEST 1 VIEW  COMPARISON:  09/13/2019 FINDINGS: Heart size is unremarkable. Aortic calcifications are noted. There is atelectasis at the lung bases. No definite large focal infiltrate. No pneumothorax or pleural effusion. Degenerative changes are noted of both glenohumeral joints. IMPRESSION: No active disease. Electronically Signed   By: [name] M.D.   On: 06/02/2020 16:36   DG Hip Unilat W or W/O Pelvis 1 View Left  Result Date: 05/17/2020 CLINICAL DATA:  History of lymphoma. Hypermetabolic lesion in the left iliac crest on prior PET CT scan. EXAM: DG HIP (WITH OR WITHOUT PELVIS) 1V*L* COMPARISON:  PET CT scan 03/27/2019 and CT abdomen and pelvis 01/03/2020. FINDINGS: This examination is limited as the patient was unable to stand or lie on the table for imaging. There is subtle lucency in the anterior right ilium compatible with the lesion seen on the prior studies. No other abnormality is identified. IMPRESSION: Markedly limited study demonstrating a lucent lesion in the right ilium worrisome for a lymphomatous deposit as seen on the prior studies. Electronically Signed   By: Inge Rise M.D.   On: 05/17/2020 09:17   DG Hip Unilat W or Wo Pelvis 2-3 Views Left  Result Date: 06/02/2020 CLINICAL DATA:  Pain EXAM: DG HIP (WITH OR WITHOUT PELVIS) 2-3V LEFT COMPARISON:  05/15/2020.  CT dated January 03, 2020 FINDINGS: There are degenerative changes of both hips, left worse than right. Evaluation for fractures is limited by patient body habitus. There are increasing lucencies in the proximal left femur when compared to prior studies. IMPRESSION: 1. Evaluation limited by patient body habitus. 2. No definite acute displaced fracture or dislocation. 3. Increasing lucencies in the proximal left femur raises concern for an underlying lesion. Follow-up with a contrast-enhanced MRI is recommended. Electronically Signed   By: [name] M.D.   On: 06/02/2020 16:35    ASSESSMENT AND PLAN: 1. 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 Treated with multiple systemic therapies including pentostatin/Cytoxan/rituximab in 2010.   Cycle 1 bendamustine/Rituxan 05/31/2014.  Cycle 2 bendamustine/Rituxan 07/02/2014  Cycle 3 bendamustine/rituximab 07/30/2014  Cycle 4 bendamustine/rituximab 09/10/2014  Cycle 5 bendamustine/rituximab 10/22/2014  Cycle 6 bendamustine/rituximab 12/10/2014  PET scan 03/27/2019-diffuse small hypermetabolic lymph nodes in the neck, chest, abdomen, and pelvis, heterogenous hypermetabolism in the bones  CT chest 10/02/2019-bronchiectasis at the lung bases, 13 mm nodular area at the medial left hemidiaphragm, mildly enlarged mediastinal nodes-decreased from June 2020, slightly enlarged left axillary node-9 mm  Rising [name] level March and April 2021  Cycle 1 bendamustine/rituximab 11/27/2019  Cycle 2 bendamustine/Rituxan 12/26/2019  01/22/2020 [name] 4225 (stable)   Cycle 1 cyclophosphamide, pentostatin, rituximab 05/09/2020  Cycle 2 cyclophosphamide, pentostatin, rituximab 05/31/2020  Xray left hip 06/02/2020-no definite acute displaced fracture or disolcation; increasing lucencies in the proximal left femur  CT abdomen and pelvis 06/02/2020-innumerable lytic lesions throughout the pelvic bones and ribs; probable non displaced pathologic and nonpathologic fractures of the sacrum bilaterally; 6 cm right inguinal lymph node; bilateral airspace disease; new compression fracture L5; borderline splenomegaly 2. Left knee arthritis. Followed by Dr. Wynelle Link. 3. Sleep apnea. 4. Anemia. microcytic  with peripheral blood smear findings consistent with iron deficiency 07/09/2015, serum iron studies consistent with "anemia of chronic disease ", stool Hemoccult cards negative 07/11/2015 5. Question left parotitis  12/12/2013 presenting with left facial and parotid edema. 6. Admission with pneumonia February 2016 7. History of Neutropenia secondary to chemotherapy, now with moderate neutropenia 8. Left lung pneumonia 04/02/2015, sputum culture positive for Moraxella 06/19/2015 9. Low IgG and IgA 10. CT chest 05/13/2015 with bilateral lower lung consolidation 11. CT chest, abdomen, and pelvis 01/08/2016-new peribronchial thickening and peripheral tree in bud pattern at the right lower lobe, no lymphadenopathy 12. Sinus mucosal thickening/opacification on CT 08/16/2015-status post sinus surgery 12/05/2015 13. Sputum culture positive for pseudomonas aeruginosa on 01/10/2016-treated with 10 days of ciprofloxacin 14. 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 Admission with pneumonia 08/26/2016 16. Left lung nodule increased in size on a CT 01/06/2019, not hypermetabolic on the PET scan 01/30/4764, followed by Dr. Elsworth Soho 17.Admission with rectal bleeding 01/02/2020-diverticular? 18.  Pain at the right arm and left iliac-likely related to lymphoma, plain x-ray 05/07/2020 at [facility] revealed destructive changes in the right humerus and radius, x-ray of the pelvis 05/15/2020-subtle lucent lesion in the right ilium 19.  Hospital admission 06/02/2020 -pneumonia, AKI, anemia 20.  Pain secondary to lymphoma involving the pelvis and pelvic fractures-initiation of palliative radiation 06/06/2020  Natasha Ellis is much more alert this morning.  She reports adequate pain relief when she receives pain medication.  She will complete palliative radiation tomorrow.  Hopefully she will be able to increase her activity level and participate in physical therapy.  Recommendations: 1.  Continue oxycodone as needed for pain 2.  Continue palliative radiation 3.  Increase ambulation as possible 4.  Outpatient follow-up at the Cancer center 06/19/2020 as scheduled    LOS: 7 days   [name] 06/11/20

## 2020-06-12 ENCOUNTER — Encounter: Payer: Self-pay | Admitting: Radiation Oncology

## 2020-06-12 ENCOUNTER — Ambulatory Visit
Admit: 2020-06-12 | Discharge: 2020-06-12 | Disposition: A | Discharge status: Home / Self Care | Payer: Medicare Other | Attending: Radiation Oncology | Admitting: Radiation Oncology

## 2020-06-12 LAB — RESPIRATORY PANEL BY RT PCR (FLU A&B, COVID)
Influenza A by PCR: NEGATIVE
Influenza B by PCR: NEGATIVE
SARS Coronavirus 2 by RT PCR: NEGATIVE

## 2020-06-12 NOTE — Progress Notes (Signed)
Called report to [place], Therapist, sports at [employer].

## 2020-06-12 NOTE — Clinical Social Work Note (Signed)
Respiratory Panel by RT PCR (Flu A&B, Covid) - Nasopharyngeal Swab [[id number]] Collected: 06/12/20 1059  Order Status: Completed Specimen: Nasopharyngeal Swab Updated: 06/12/20 1150   SARS Coronavirus 2 by RT PCR NEGATIVE   Comment: (NOTE)  SARS-CoV-2 target nucleic acids are NOT DETECTED.   The SARS-CoV-2 RNA is generally detectable in upper respiratoy  specimens during the acute phase of infection. The lowest  concentration of SARS-CoV-2 viral copies this assay can detect is  131 copies/mL. A negative result does not preclude SARS-Cov-2  infection and should not be used as the sole basis for treatment or  other patient management decisions. A negative result may occur with  improper specimen collection/handling, submission of specimen other  than nasopharyngeal swab, presence of viral mutation(s) within the  areas targeted by this assay, and inadequate number of viral copies  (<131 copies/mL). A negative result must be combined with clinical  observations, patient history, and epidemiological information. The  expected result is Negative.   Fact Sheet for Patients:  PinkCheek.be   Fact Sheet for Healthcare Providers:  GravelBags.it   This test is not yet approved or cleared by the [place] FDA and  has been authorized for detection and/or diagnosis of SARS-CoV-2 by  FDA under an Emergency Use Authorization (EUA). This EUA will remain  in effect (meaning this test can be used) for the duration of the  COVID-19 declaration under Section 564(b)(1) of the Act, 21 U.S.C.  section 360bbb-3(b)(1), unless the authorization is terminated or  revoked sooner.       Influenza A by PCR NEGATIVE   Influenza B by PCR NEGATIVE   Comment: (NOTE)  The Xpert Xpress SARS-CoV-2/FLU/RSV assay is intended as an aid in  the diagnosis of influenza from Nasopharyngeal swab specimens and  should not be used as a sole basis for treatment.  Nasal washings and  aspirates are unacceptable for Xpert Xpress SARS-CoV-2/FLU/RSV  testing.   Fact Sheet for Patients:  PinkCheek.be   Fact Sheet for Healthcare Providers:  GravelBags.it   This test is not yet approved or cleared by the [place] FDA and  has been authorized for detection and/or diagnosis of SARS-CoV-2 by  FDA under an Emergency Use Authorization (EUA). This EUA will remain  in effect (meaning this test can be used) for the duration of the  Covid-19 declaration under Section 564(b)(1) of the Act, 21  U.S.C. section 360bbb-3(b)(1), unless the authorization is  terminated or revoked.  Performed at [facility], [place] [address],  [place], [place] [address]

## 2020-06-12 NOTE — NC FL2 (Signed)
[place] LEVEL OF CARE SCREENING TOOL     IDENTIFICATION  Patient Name: Natasha Ellis Birthdate: August 23, 1930 Sex: female Admission Date (Current Location): 06/02/2020  [facility] and [place] Number:  Herbalist and Address:  [facility],  [place] [address] [place]      Provider Number: [id number]  Attending Physician Name and Address:  Caren Griffins, MD  Relative Name and Phone Number:  [place] (Daughter) [phone]    Current Level of Care: Hospital Recommended Level of Care: [place] Prior Approval Number:    Date Approved/Denied:   PASRR Number: [id number]  Discharge Plan: SNF [facility])    Current Diagnoses: Patient Active Problem List   Diagnosis Date Noted  . Low grade B cell lymphoproliferative disorder ([place]) 06/05/2020  . Bone metastases ([place]) 06/05/2020  . Pneumonia 06/04/2020  . 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 Rectal bleeding 01/02/2020  . PAF (paroxysmal atrial fibrillation) ([place]) 01/02/2020  . Goals of care, counseling/discussion 11/14/2019  . CKD (chronic kidney disease) stage 3, GFR 30-59 ml/min (HCC) 01/08/2019  . AKI (acute kidney injury) ([place]) 01/08/2019  . Physical deconditioning 09/15/2018  . Bronchiectasis ([place]) 09/06/2018  . Lung nodule 09/06/2018  . Ascending aortic aneurysm ([place]) 09/06/2018  . Hypoxia 08/15/2016  . Severe obesity (BMI >= 40) ([place]) 07/30/2015  . Multifocal pneumonia 06/19/2015  . Anemia 06/10/2015  . Anemia due to other cause 06/10/2015  . OSA (obstructive sleep apnea) 09/20/2014  . History of lymphoma 09/20/2014  . History of TIA (transient ischemic attack) 09/20/2014  . Diarrhea   . Pain in gums 08/15/2014  . Neutropenia ([place]) 08/15/2014  . Hypersensitivity reaction 05/31/2014  . Waldenstrom's macroglobulinemia ([place]) 04/19/2014  . HTN (hypertension) 04/29/2013  . HLD (hyperlipidemia) 04/29/2013   . Lacunar infarction ([place]) 04/29/2013  . GERD (gastroesophageal reflux disease) 04/29/2013  . TIA (transient ischemic attack) 04/29/2013  . Back pain 04/29/2013  . Asthma 04/29/2013  . Dyspnea 01/25/2013  . Chronic cough 01/01/2013    Orientation RESPIRATION BLADDER Height & Weight     Self, Time, Situation, Place  O2 (3L [place]) External catheter Weight: 121.5 kg Height:  5\' 6"  (167.6 cm)  BEHAVIORAL SYMPTOMS/MOOD NEUROLOGICAL BOWEL NUTRITION STATUS   (none)  (none) Incontinent Diet (see d/c summary)  AMBULATORY STATUS COMMUNICATION OF NEEDS Skin   Extensive Assist Verbally Other (Comment) (moisture associated skin damage to bilateral buttocks)                       Personal Care Assistance Level of Assistance  Bathing, Feeding, Dressing Bathing Assistance: Maximum assistance Feeding assistance: Independent Dressing Assistance: Maximum assistance     Functional Limitations Info  Sight, Hearing, Speech Sight Info: Adequate Hearing Info: Impaired Speech Info: Adequate    SPECIAL CARE FACTORS FREQUENCY  PT (By licensed PT), OT (By licensed OT)     PT Frequency: 5X/W OT Frequency: 5X/W            Contractures Contractures Info: Not present    Additional Factors Info  Code Status, Allergies Code Status Info: DNR Allergies Info: Codeine, Amoxicillin, Cefdinir, Doxycycline, Erythromycin, Lisinopril, Oxybutynin, Sulfa Antibiotics           Current Medications (06/12/2020):  This is the current hospital active medication list Current Facility-Administered Medications  Medication Dose Route Frequency Provider Last Rate Last Admin  . 0.9 %  sodium chloride infusion (Manually program via Guardrails IV Fluids)  Intravenous Once Arrien, Jimmy Picket, MD      . acetaminophen (TYLENOL) tablet 650 mg  650 mg Oral Q6H Arrien, Jimmy Picket, MD   650 mg at 06/12/20 0812  . albuterol (VENTOLIN HFA) 108 (90 Base) MCG/ACT inhaler 1 puff  1 puff Inhalation Q4H PRN  Arrien, Jimmy Picket, MD      . allopurinol (ZYLOPRIM) tablet 100 mg  100 mg Oral BID Tawni Millers, MD   100 mg at 06/11/20 2204  . ALPRAZolam Duanne Moron) tablet 1 mg  1 mg Oral TID PRN Arrien, Jimmy Picket, MD      . apixaban Arne Cleveland) tablet 2.5 mg  2.5 mg Oral BID Etta Quill, DO   2.5 mg at 06/11/20 2204  . calcium-vitamin D (OSCAL WITH D) 500-200 MG-UNIT per tablet 1 tablet  1 tablet Oral Q breakfast Arrien, Jimmy Picket, MD   1 tablet at 06/12/20 [phone]  . Chlorhexidine Gluconate Cloth 2 % PADS 6 each  6 each Topical Daily Bonnell Public, MD   6 each at 06/12/20 [phone]  . cholestyramine light (PREVALITE) packet 4 g  4 g Oral Daily PRN Arrien, Jimmy Picket, MD      . diltiazem (CARDIZEM CD) 24 hr capsule 180 mg  180 mg Oral Daily Arrien, Jimmy Picket, MD   180 mg at 06/11/20 1105  . fluticasone (FLONASE) 50 MCG/ACT nasal spray 1 spray  1 spray Each Nare BID PRN Arrien, Jimmy Picket, MD      . fluticasone furoate-vilanterol (BREO ELLIPTA) 200-25 MCG/INH 1 puff  1 puff Inhalation Daily Arrien, Jimmy Picket, MD   1 puff at 06/12/20 0849  . guaiFENesin-dextromethorphan (ROBITUSSIN DM) 100-10 MG/5ML syrup 5 mL  5 mL Oral Q4H PRN Arrien, Jimmy Picket, MD   5 mL at 06/08/20 0912  . hydrocortisone (ANUSOL-HC) 2.5 % rectal cream 1 application  1 application Topical Daily PRN Arrien, Jimmy Picket, MD      . HYDROmorphone (DILAUDID) injection 1 mg  1 mg Intravenous Q2H PRN Arrien, Jimmy Picket, MD   1 mg at 06/12/20 [phone]  . lactose free nutrition (BOOST PLUS) liquid 237 mL  237 mL Oral TID BM Arrien, Jimmy Picket, MD   237 mL at 06/11/20 1943  . lip balm (CARMEX) ointment   Topical PRN Dana Allan I, MD      . LORazepam (ATIVAN) tablet 0.5 mg  0.5 mg Oral Daily PRN [place] Pedro, PA-C   0.5 mg at 06/11/20 1107  . multivitamin with minerals tablet 1 tablet  1 tablet Oral Daily Arrien, Jimmy Picket, MD   1 tablet at 06/11/20 1104  . ondansetron  (ZOFRAN) injection 4 mg  4 mg Intravenous Q6H PRN Arrien, Jimmy Picket, MD   4 mg at 06/06/20 0728  . oxyCODONE (Oxy IR/ROXICODONE) immediate release tablet 5 mg  5 mg Oral Q4H PRN Ladell Pier, MD   5 mg at 06/12/20 5277  . pantoprazole (PROTONIX) EC tablet 40 mg  40 mg Oral Daily Arrien, Jimmy Picket, MD   40 mg at 06/11/20 1105  . polyethylene glycol (MIRALAX / GLYCOLAX) packet 17 g  17 g Oral Daily PRN Arrien, Jimmy Picket, MD         Discharge Medications: Please see discharge summary for a list of discharge medications.  Relevant Imaging Results:  Relevant Lab Results:   Additional Information SS# [id number]  Trish Mage, LCSW

## 2020-06-12 NOTE — TOC Transition Note (Signed)
Transition of Care [facility]) - CM/SW Discharge Note   Patient Details  Name: Natasha Ellis MRN: [id number] Date of Birth: 11/22/1930  Transition of Care [facility]) CM/SW Contact:  Trish Mage, LCSW Phone Number: 06/12/2020, 1:33 PM   Clinical Narrative:  Patient is transitioning back to [facility] SNF today.  COVID negative test confirmed, and info posted on HUB for facility.  PTAR arranged.  Nursing, please call report to [phone], room 112a. Facility is not willing to accept patient back if after 6PM.  PTAR and RN informed. TOC sign off.     Final next level of care: Skilled Nursing Facility Barriers to Discharge: Barriers Resolved   Patient Goals and CMS Choice        Discharge Placement                       Discharge Plan and Services In-house Referral: Clinical Social Work                                   Social Determinants of Health (SDOH) Interventions     Readmission Risk Interventions Readmission Risk Prevention Plan 06/12/2020 01/09/2019 01/09/2019  Transportation Screening Complete Complete Complete  PCP or Specialist Appt within 3-5 Days Complete Not Complete Not Complete  Not Complete comments - not ready for d/c -  HRI or Home Care Consult Complete Complete -  Social Work Consult for [place] Planning/Counseling Complete Complete -  Palliative Care Screening Not Applicable Not Applicable -  Medication Review Press photographer) Complete Complete -  Some recent data might be hidden

## 2020-06-12 NOTE — Progress Notes (Signed)
Patient seen and examined this morning, appears stable for discharge, please refer to discharge summary by Dr. Cathlean Sauer on 11/9.  Patient reports that oral pain medications are adequate in controlling her pain.  She will go to SNF this afternoon after her last radiation therapy.   Myriam Brandhorst M. Cruzita Lederer, MD, PhD [facility]  Between 7 am - 7 pm you can contact me via [place] or [place].  I am not available 7 pm - 7 am, please contact night coverage MD/APP via Am[place]

## 2020-06-12 NOTE — Progress Notes (Signed)
Attempted to call report no answer. PTAR here to pick patient up now. Note placed on AVS packet for facility to call me for report. Will attempt again later.

## 2020-06-14 DIAGNOSIS — R509 Fever, unspecified: Secondary | ICD-10-CM | POA: Diagnosis not present

## 2020-06-14 DIAGNOSIS — R059 Cough, unspecified: Secondary | ICD-10-CM | POA: Diagnosis not present

## 2020-06-15 DIAGNOSIS — I517 Cardiomegaly: Secondary | ICD-10-CM | POA: Diagnosis not present

## 2020-06-15 DIAGNOSIS — R059 Cough, unspecified: Secondary | ICD-10-CM | POA: Diagnosis not present

## 2020-06-16 ENCOUNTER — Other Ambulatory Visit: Payer: Self-pay | Admitting: Oncology

## 2020-06-17 ENCOUNTER — Telehealth: Payer: Self-pay | Admitting: *Deleted

## 2020-06-17 NOTE — Telephone Encounter (Signed)
Dr. Benay Spice spoke w/daugter and feels she can still be treated. High priority scheduling message sent for treatment to be in bed due to immobility and pain.

## 2020-06-17 NOTE — Telephone Encounter (Signed)
Scheduled for OV and chemo on 06/19/20. She is completely bedridden now and would require ambulance transport to get here and back. Asking if we are able to treat patients who are bedridden? Would like to talk w/MD if possible.

## 2020-06-19 ENCOUNTER — Inpatient Hospital Stay (HOSPITAL_BASED_OUTPATIENT_CLINIC_OR_DEPARTMENT_OTHER): Payer: Medicare Other | Admitting: Nurse Practitioner

## 2020-06-19 ENCOUNTER — Inpatient Hospital Stay: Payer: Medicare Other | Attending: Oncology

## 2020-06-19 ENCOUNTER — Inpatient Hospital Stay: Payer: Medicare Other

## 2020-06-19 ENCOUNTER — Telehealth: Payer: Self-pay | Admitting: *Deleted

## 2020-06-19 ENCOUNTER — Other Ambulatory Visit: Payer: Self-pay

## 2020-06-19 VITALS — BP 125/70 | HR 80 | Temp 98.2°F | Resp 16

## 2020-06-19 VITALS — BP 136/62 | HR 84 | Temp 98.1°F | Resp 18 | Ht 66.0 in

## 2020-06-19 DIAGNOSIS — Z5112 Encounter for antineoplastic immunotherapy: Secondary | ICD-10-CM | POA: Insufficient documentation

## 2020-06-19 DIAGNOSIS — T451X5A Adverse effect of antineoplastic and immunosuppressive drugs, initial encounter: Secondary | ICD-10-CM | POA: Insufficient documentation

## 2020-06-19 DIAGNOSIS — I959 Hypotension, unspecified: Secondary | ICD-10-CM | POA: Diagnosis not present

## 2020-06-19 DIAGNOSIS — C8800 Waldenstrom macroglobulinemia not having achieved remission: Secondary | ICD-10-CM

## 2020-06-19 DIAGNOSIS — G893 Neoplasm related pain (acute) (chronic): Secondary | ICD-10-CM | POA: Insufficient documentation

## 2020-06-19 DIAGNOSIS — I4891 Unspecified atrial fibrillation: Secondary | ICD-10-CM | POA: Insufficient documentation

## 2020-06-19 DIAGNOSIS — D649 Anemia, unspecified: Secondary | ICD-10-CM | POA: Diagnosis not present

## 2020-06-19 DIAGNOSIS — R059 Cough, unspecified: Secondary | ICD-10-CM | POA: Diagnosis not present

## 2020-06-19 DIAGNOSIS — Z8673 Personal history of transient ischemic attack (TIA), and cerebral infarction without residual deficits: Secondary | ICD-10-CM | POA: Diagnosis not present

## 2020-06-19 DIAGNOSIS — D701 Agranulocytosis secondary to cancer chemotherapy: Secondary | ICD-10-CM | POA: Diagnosis not present

## 2020-06-19 DIAGNOSIS — R0902 Hypoxemia: Secondary | ICD-10-CM | POA: Diagnosis not present

## 2020-06-19 DIAGNOSIS — M2550 Pain in unspecified joint: Secondary | ICD-10-CM | POA: Diagnosis not present

## 2020-06-19 DIAGNOSIS — G4730 Sleep apnea, unspecified: Secondary | ICD-10-CM | POA: Diagnosis not present

## 2020-06-19 DIAGNOSIS — R509 Fever, unspecified: Secondary | ICD-10-CM | POA: Diagnosis not present

## 2020-06-19 DIAGNOSIS — Z5111 Encounter for antineoplastic chemotherapy: Secondary | ICD-10-CM | POA: Diagnosis not present

## 2020-06-19 DIAGNOSIS — Z7401 Bed confinement status: Secondary | ICD-10-CM | POA: Diagnosis not present

## 2020-06-19 DIAGNOSIS — R5381 Other malaise: Secondary | ICD-10-CM | POA: Diagnosis not present

## 2020-06-19 LAB — CMP (CANCER CENTER ONLY)
ALT: 15 U/L (ref 0–44)
AST: 29 U/L (ref 15–41)
Albumin: 2.3 g/dL — ABNORMAL LOW (ref 3.5–5.0)
Alkaline Phosphatase: 178 U/L — ABNORMAL HIGH (ref 38–126)
Anion gap: 7 (ref 5–15)
BUN: 25 mg/dL — ABNORMAL HIGH (ref 8–23)
CO2: 27 mmol/L (ref 22–32)
Calcium: 8.8 mg/dL — ABNORMAL LOW (ref 8.9–10.3)
Chloride: 103 mmol/L (ref 98–111)
Creatinine: 1.24 mg/dL — ABNORMAL HIGH (ref 0.44–1.00)
GFR, Estimated: 42 mL/min — ABNORMAL LOW
Glucose, Bld: 102 mg/dL — ABNORMAL HIGH (ref 70–99)
Potassium: 4.7 mmol/L (ref 3.5–5.1)
Sodium: 137 mmol/L (ref 135–145)
Total Bilirubin: 1.3 mg/dL — ABNORMAL HIGH (ref 0.3–1.2)
Total Protein: 7.4 g/dL (ref 6.5–8.1)

## 2020-06-19 LAB — CBC WITH DIFFERENTIAL (CANCER CENTER ONLY)
Abs Immature Granulocytes: 0.12 10*3/uL — ABNORMAL HIGH (ref 0.00–0.07)
Basophils Absolute: 0 10*3/uL (ref 0.0–0.1)
Basophils Relative: 1 %
Eosinophils Absolute: 0.2 10*3/uL (ref 0.0–0.5)
Eosinophils Relative: 6 %
HCT: 20.4 % — ABNORMAL LOW (ref 36.0–46.0)
Hemoglobin: 7.2 g/dL — ABNORMAL LOW (ref 12.0–15.0)
Immature Granulocytes: 3 %
Lymphocytes Relative: 24 %
Lymphs Abs: 0.9 10*3/uL (ref 0.7–4.0)
MCH: 32.9 pg (ref 26.0–34.0)
MCHC: 35.3 g/dL (ref 30.0–36.0)
MCV: 93.2 fL (ref 80.0–100.0)
Monocytes Absolute: 0.4 10*3/uL (ref 0.1–1.0)
Monocytes Relative: 9 %
Neutro Abs: 2.2 10*3/uL (ref 1.7–7.7)
Neutrophils Relative %: 57 %
Platelet Count: 430 10*3/uL — ABNORMAL HIGH (ref 150–400)
RBC: 2.19 MIL/uL — ABNORMAL LOW (ref 3.87–5.11)
RDW: 23.7 % — ABNORMAL HIGH (ref 11.5–15.5)
WBC Count: 3.9 10*3/uL — ABNORMAL LOW (ref 4.0–10.5)
nRBC: 0 % (ref 0.0–0.2)

## 2020-06-19 LAB — SAMPLE TO BLOOD BANK

## 2020-06-19 LAB — URIC ACID: Uric Acid, Serum: 6.8 mg/dL (ref 2.5–7.1)

## 2020-06-19 LAB — LACTATE DEHYDROGENASE: LDH: 305 U/L — ABNORMAL HIGH (ref 98–192)

## 2020-06-19 MED ORDER — SODIUM CHLORIDE 0.9 % IV SOLN
Freq: Once | INTRAVENOUS | Status: AC
  Filled 2020-06-19: qty 250

## 2020-06-19 MED ORDER — DIPHENHYDRAMINE HCL 25 MG PO CAPS
50.0000 mg | ORAL_CAPSULE | Freq: Once | ORAL | Status: AC
  Administered 2020-06-19: 50 mg via ORAL

## 2020-06-19 MED ORDER — SODIUM CHLORIDE 0.9 % IV SOLN
2.0000 mg/m2 | Freq: Once | INTRAVENOUS | Status: AC
  Administered 2020-06-19: 4.6 mg via INTRAVENOUS
  Filled 2020-06-19: qty 2.3

## 2020-06-19 MED ORDER — PALONOSETRON HCL INJECTION 0.25 MG/5ML
INTRAVENOUS | Status: AC
  Filled 2020-06-19: qty 5

## 2020-06-19 MED ORDER — SODIUM CHLORIDE 0.9 % IV SOLN
10.0000 mg | Freq: Once | INTRAVENOUS | Status: AC
  Administered 2020-06-19: 10 mg via INTRAVENOUS
  Filled 2020-06-19: qty 10

## 2020-06-19 MED ORDER — ACETAMINOPHEN 325 MG PO TABS
650.0000 mg | ORAL_TABLET | Freq: Once | ORAL | Status: AC
  Administered 2020-06-19: 650 mg via ORAL

## 2020-06-19 MED ORDER — DIPHENHYDRAMINE HCL 25 MG PO CAPS
ORAL_CAPSULE | ORAL | Status: AC
  Filled 2020-06-19: qty 2

## 2020-06-19 MED ORDER — SODIUM CHLORIDE 0.9 % IV SOLN
400.0000 mg/m2 | Freq: Once | INTRAVENOUS | Status: AC
  Administered 2020-06-19: 920 mg via INTRAVENOUS
  Filled 2020-06-19: qty 46

## 2020-06-19 MED ORDER — SODIUM CHLORIDE 0.9 % IV SOLN
375.0000 mg/m2 | Freq: Once | INTRAVENOUS | Status: AC
  Administered 2020-06-19: 900 mg via INTRAVENOUS
  Filled 2020-06-19: qty 50

## 2020-06-19 MED ORDER — ACETAMINOPHEN 325 MG PO TABS
ORAL_TABLET | ORAL | Status: AC
  Filled 2020-06-19: qty 2

## 2020-06-19 MED ORDER — PALONOSETRON HCL INJECTION 0.25 MG/5ML
0.2500 mg | Freq: Once | INTRAVENOUS | Status: AC
  Administered 2020-06-19: 0.25 mg via INTRAVENOUS

## 2020-06-19 NOTE — Progress Notes (Signed)
[place] OFFICE PROGRESS NOTE   Diagnosis: Natasha Ellis  INTERVAL HISTORY:   Ms. Legault returns for follow-up.  She completed cycle two cyclophosphamide/pentostatin/rituximab 05/31/2020.  She was hospitalized 06/02/2020 through 06/11/2020 with multifocal pneumonia, pain related to bone lesions, atrial fibrillation with rapid ventricular response.  She was discharged to a skilled nursing facility 06/11/2020.  She is seen today prior to proceeding with the next cycle of chemotherapy.  She worked with physical therapy yesterday and reports she was able to sit on the side of the bed and stand up/sit down x3.  She then became very weak.  She denies nausea/vomiting.  Bowels are moving.  She denies shortness of breath.  Periodic cough.  No fever.  Pain is better but has not resolved.  Objective:  Vital signs in last 24 hours:  Blood pressure 136/62, pulse 84, temperature 98.1 F (36.7 C), temperature source Tympanic, resp. rate 18, height 5\' 6"  (1.676 m), SpO2 100 %.  Lymphatics: Approximate 4 cm right inguinal lymph node. Resp: Bilateral rhonchi anteriorly.  No respiratory distress. Cardio: Regular rate and rhythm. GI: No hepatosplenomegaly. Vascular: Edema at the lower legs bilaterally. Neuro: Alert and oriented.  Lab Results:  Lab Results  Component Value Date   WBC 3.9 (L) 06/19/2020   HGB 7.2 (L) 06/19/2020   HCT 20.4 (L) 06/19/2020   MCV 93.2 06/19/2020   PLT 430 (H) 06/19/2020   NEUTROABS 2.2 06/19/2020    Imaging:  No results found.  Medications: I have reviewed the patient's current medications.  Assessment/Plan: 1. 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 Treated with multiple systemic therapies including pentostatin/Cytoxan/rituximab in 2010.   Cycle 1  bendamustine/Rituxan 05/31/2014.  Cycle 2 bendamustine/Rituxan 07/02/2014  Cycle 3 bendamustine/rituximab 07/30/2014  Cycle 4 bendamustine/rituximab 09/10/2014  Cycle 5 bendamustine/rituximab 10/22/2014  Cycle 6 bendamustine/rituximab 12/10/2014  PET scan 03/27/2019-diffuse small hypermetabolic lymph nodes in the neck, chest, abdomen, and pelvis, heterogenous hypermetabolism in the bones  CT chest 10/02/2019-bronchiectasis at the lung bases, 13 mm nodular area at the medial left hemidiaphragm, mildly enlarged mediastinal nodes-decreased from June 2020, slightly enlarged left axillary node-9 mm  Rising [name] level March and April 2021  Cycle 1 bendamustine/rituximab 11/27/2019  Cycle 2 bendamustine/Rituxan 12/26/2019  01/22/2020 [name] 4225 (stable)   Cycle 1 cyclophosphamide, pentostatin, rituximab 05/09/2020  Cycle 2 cyclophosphamide, pentostatin, rituximab 05/31/2020  Xray left hip 06/02/2020-no definite acute displaced fracture or disolcation; increasing lucencies in the proximal left femur  CT abdomen and pelvis 06/02/2020-innumerable lytic lesions throughout the pelvic bones and ribs; probable non displaced pathologic and nonpathologic fractures of the sacrum bilaterally; 6 cm right inguinal lymph node; bilateral airspace disease; new compression fracture L5; borderline splenomegaly  Palliative radiation 06/06/2020-06/12/2020  Cycle 3 cyclophosphamide, pentostatin, rituximab 06/19/2020 2. Left knee arthritis. Followed by Dr. Wynelle Link. 3. Sleep apnea. 4. Anemia. microcytic with peripheral blood smear findings consistent with iron deficiency 07/09/2015, serum iron studies consistent with "anemia of chronic disease ", stool Hemoccult cards negative 07/11/2015 5. Question left parotitis 12/12/2013 presenting with left facial and parotid edema. 6. Admission with pneumonia February 2016 7. History of Neutropenia secondary to chemotherapy, now with moderate neutropenia 8. Left lung  pneumonia 04/02/2015, sputum culture positive for Moraxella 06/19/2015 9. Low IgG and IgA 10. CT chest 05/13/2015 with bilateral lower lung consolidation 11. CT chest, abdomen, and pelvis 01/08/2016-new  peribronchial thickening and peripheral tree in bud pattern at the right lower lobe, no lymphadenopathy 12. Sinus mucosal thickening/opacification on CT 08/16/2015-status post sinus surgery 12/05/2015 13. Sputum culture positive for pseudomonas aeruginosa on 01/10/2016-treated with 10 days of ciprofloxacin 14. 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 Admission with pneumonia 08/26/2016 16. Left lung nodule increased in size on a CT 01/06/2019, not hypermetabolic on the PET scan 8/67/6720, followed by Dr. Elsworth Soho 17.Admission with rectal bleeding 01/02/2020-diverticular? 18. Pain at the right arm and left iliac-likely related to lymphoma, plain x-ray 05/07/2020 at [facility] revealed destructive changes in the right humerus and radius, x-ray of the pelvis 05/15/2020-subtle lucent lesion in the right ilium 19.  Hospital admission 06/02/2020 -pneumonia, AKI, anemia 20.  Pain secondary to lymphoma involving the pelvis and pelvic fractures-initiation of palliative radiation 06/06/2020  Disposition: Ms. Ohlsen has completed 2 cycles of cyclophosphamide/pentostatin/rituximab.  Plan to proceed with cycle 3 today as scheduled.  We will follow-up on the [name] level from today.  We reviewed the CBC and chemistry panel from today.  Labs adequate to proceed with treatment.  We will request a CBC at the nursing facility 06/26/2020.  Pain has improved significantly since completing the course of radiation.  She will continue to work with physical therapy.  She will return for lab, follow-up, cycle 4 cyclophosphamide/pentostatin/rituximab in 3 weeks.  She will contact the office in the interim with any problems.   Patient seen with Dr. Benay Spice.  Ned Card ANP/GNP-BC   06/19/2020  10:56  AM This was a shared visit with Ned Card.  Ms. Naeve was interviewed and examined.  Her pain is significantly improved following the course of palliative radiation.  She has persistent anemia.  The white count has recovered.  She will complete cycle 3 of cyclophosphamide/pentostatin/rituximab today.  Julieanne Manson, MD

## 2020-06-19 NOTE — Telephone Encounter (Signed)
Called verbal orders per Dr. Benay Spice to SNF: CBC/DIFF on 06/26/20 and fax results to [phone] Prednisone 20 mg qd x 2 days--1st dose 06/20/20. Also faxed orders to facility.

## 2020-06-20 ENCOUNTER — Telehealth: Payer: Self-pay | Admitting: Nurse Practitioner

## 2020-06-20 LAB — PROTEIN ELECTROPHORESIS, SERUM
A/G Ratio: 0.5 — ABNORMAL LOW (ref 0.7–1.7)
Albumin ELP: 2.4 g/dL — ABNORMAL LOW (ref 2.9–4.4)
Alpha-1-Globulin: 0.4 g/dL (ref 0.0–0.4)
Alpha-2-Globulin: 0.8 g/dL (ref 0.4–1.0)
Beta Globulin: 0.7 g/dL (ref 0.7–1.3)
Gamma Globulin: 2.5 g/dL — ABNORMAL HIGH (ref 0.4–1.8)
Globulin, Total: 4.4 g/dL — ABNORMAL HIGH (ref 2.2–3.9)
M-Spike, %: 2.3 g/dL — ABNORMAL HIGH
Total Protein ELP: 6.8 g/dL (ref 6.0–8.5)

## 2020-06-20 LAB — IGM: IgM (Immunoglobulin M), Srm: 3783 mg/dL — ABNORMAL HIGH (ref 26–217)

## 2020-06-20 NOTE — Telephone Encounter (Signed)
Scheduled appointments per 11/17 los. Spoke to patient's daughter who is aware of appointment date and time.

## 2020-06-21 DIAGNOSIS — L89152 Pressure ulcer of sacral region, stage 2: Secondary | ICD-10-CM | POA: Diagnosis not present

## 2020-06-21 DIAGNOSIS — G893 Neoplasm related pain (acute) (chronic): Secondary | ICD-10-CM | POA: Diagnosis not present

## 2020-06-22 ENCOUNTER — Emergency Department (HOSPITAL_COMMUNITY): Payer: Medicare Other

## 2020-06-22 ENCOUNTER — Inpatient Hospital Stay (HOSPITAL_COMMUNITY)
Admission: EM | Admit: 2020-06-22 | Discharge: 2020-07-01 | DRG: 871 | Disposition: A | Discharge status: Hospice | Payer: Medicare Other | Source: Skilled Nursing Facility | Attending: Family Medicine | Admitting: Family Medicine

## 2020-06-22 ENCOUNTER — Other Ambulatory Visit: Payer: Self-pay

## 2020-06-22 DIAGNOSIS — C8590 Non-Hodgkin lymphoma, unspecified, unspecified site: Secondary | ICD-10-CM | POA: Diagnosis present

## 2020-06-22 DIAGNOSIS — J188 Other pneumonia, unspecified organism: Secondary | ICD-10-CM | POA: Diagnosis not present

## 2020-06-22 DIAGNOSIS — Z8673 Personal history of transient ischemic attack (TIA), and cerebral infarction without residual deficits: Secondary | ICD-10-CM

## 2020-06-22 DIAGNOSIS — Z20822 Contact with and (suspected) exposure to covid-19: Secondary | ICD-10-CM | POA: Diagnosis not present

## 2020-06-22 DIAGNOSIS — M170 Bilateral primary osteoarthritis of knee: Secondary | ICD-10-CM | POA: Diagnosis present

## 2020-06-22 DIAGNOSIS — I5032 Chronic diastolic (congestive) heart failure: Secondary | ICD-10-CM | POA: Diagnosis present

## 2020-06-22 DIAGNOSIS — J440 Chronic obstructive pulmonary disease with acute lower respiratory infection: Secondary | ICD-10-CM | POA: Diagnosis present

## 2020-06-22 DIAGNOSIS — R609 Edema, unspecified: Secondary | ICD-10-CM | POA: Diagnosis present

## 2020-06-22 DIAGNOSIS — J9601 Acute respiratory failure with hypoxia: Secondary | ICD-10-CM | POA: Diagnosis not present

## 2020-06-22 DIAGNOSIS — R6520 Severe sepsis without septic shock: Secondary | ICD-10-CM | POA: Diagnosis present

## 2020-06-22 DIAGNOSIS — Z885 Allergy status to narcotic agent status: Secondary | ICD-10-CM

## 2020-06-22 DIAGNOSIS — D6481 Anemia due to antineoplastic chemotherapy: Secondary | ICD-10-CM | POA: Diagnosis present

## 2020-06-22 DIAGNOSIS — J189 Pneumonia, unspecified organism: Secondary | ICD-10-CM

## 2020-06-22 DIAGNOSIS — R0902 Hypoxemia: Secondary | ICD-10-CM | POA: Diagnosis not present

## 2020-06-22 DIAGNOSIS — C7951 Secondary malignant neoplasm of bone: Secondary | ICD-10-CM | POA: Diagnosis present

## 2020-06-22 DIAGNOSIS — L89156 Pressure-induced deep tissue damage of sacral region: Secondary | ICD-10-CM | POA: Diagnosis present

## 2020-06-22 DIAGNOSIS — Z8249 Family history of ischemic heart disease and other diseases of the circulatory system: Secondary | ICD-10-CM

## 2020-06-22 DIAGNOSIS — Z66 Do not resuscitate: Secondary | ICD-10-CM | POA: Diagnosis present

## 2020-06-22 DIAGNOSIS — G9341 Metabolic encephalopathy: Secondary | ICD-10-CM | POA: Diagnosis present

## 2020-06-22 DIAGNOSIS — Z803 Family history of malignant neoplasm of breast: Secondary | ICD-10-CM

## 2020-06-22 DIAGNOSIS — A419 Sepsis, unspecified organism: Principal | ICD-10-CM

## 2020-06-22 DIAGNOSIS — G9340 Encephalopathy, unspecified: Secondary | ICD-10-CM

## 2020-06-22 DIAGNOSIS — Z823 Family history of stroke: Secondary | ICD-10-CM

## 2020-06-22 DIAGNOSIS — I499 Cardiac arrhythmia, unspecified: Secondary | ICD-10-CM | POA: Diagnosis not present

## 2020-06-22 DIAGNOSIS — Z79899 Other long term (current) drug therapy: Secondary | ICD-10-CM

## 2020-06-22 DIAGNOSIS — Z90710 Acquired absence of both cervix and uterus: Secondary | ICD-10-CM

## 2020-06-22 DIAGNOSIS — Z6841 Body Mass Index (BMI) 40.0 and over, adult: Secondary | ICD-10-CM

## 2020-06-22 DIAGNOSIS — Z515 Encounter for palliative care: Secondary | ICD-10-CM | POA: Diagnosis not present

## 2020-06-22 DIAGNOSIS — L8990 Pressure ulcer of unspecified site, unspecified stage: Secondary | ICD-10-CM | POA: Insufficient documentation

## 2020-06-22 DIAGNOSIS — H9190 Unspecified hearing loss, unspecified ear: Secondary | ICD-10-CM | POA: Diagnosis present

## 2020-06-22 DIAGNOSIS — Z7901 Long term (current) use of anticoagulants: Secondary | ICD-10-CM

## 2020-06-22 DIAGNOSIS — R0689 Other abnormalities of breathing: Secondary | ICD-10-CM | POA: Diagnosis not present

## 2020-06-22 DIAGNOSIS — I4891 Unspecified atrial fibrillation: Secondary | ICD-10-CM | POA: Diagnosis not present

## 2020-06-22 DIAGNOSIS — Z751 Person awaiting admission to adequate facility elsewhere: Secondary | ICD-10-CM

## 2020-06-22 DIAGNOSIS — Z9989 Dependence on other enabling machines and devices: Secondary | ICD-10-CM

## 2020-06-22 DIAGNOSIS — J45909 Unspecified asthma, uncomplicated: Secondary | ICD-10-CM | POA: Diagnosis not present

## 2020-06-22 DIAGNOSIS — Z881 Allergy status to other antibiotic agents status: Secondary | ICD-10-CM

## 2020-06-22 DIAGNOSIS — C880 Waldenstrom macroglobulinemia: Secondary | ICD-10-CM | POA: Diagnosis present

## 2020-06-22 DIAGNOSIS — Z87891 Personal history of nicotine dependence: Secondary | ICD-10-CM

## 2020-06-22 DIAGNOSIS — Z882 Allergy status to sulfonamides status: Secondary | ICD-10-CM

## 2020-06-22 DIAGNOSIS — I130 Hypertensive heart and chronic kidney disease with heart failure and stage 1 through stage 4 chronic kidney disease, or unspecified chronic kidney disease: Secondary | ICD-10-CM | POA: Diagnosis present

## 2020-06-22 DIAGNOSIS — D709 Neutropenia, unspecified: Secondary | ICD-10-CM | POA: Diagnosis present

## 2020-06-22 DIAGNOSIS — D47Z9 Other specified neoplasms of uncertain behavior of lymphoid, hematopoietic and related tissue: Secondary | ICD-10-CM | POA: Diagnosis present

## 2020-06-22 DIAGNOSIS — G893 Neoplasm related pain (acute) (chronic): Secondary | ICD-10-CM | POA: Diagnosis present

## 2020-06-22 DIAGNOSIS — N1832 Chronic kidney disease, stage 3b: Secondary | ICD-10-CM | POA: Diagnosis present

## 2020-06-22 DIAGNOSIS — D649 Anemia, unspecified: Secondary | ICD-10-CM | POA: Diagnosis present

## 2020-06-22 DIAGNOSIS — R52 Pain, unspecified: Secondary | ICD-10-CM

## 2020-06-22 DIAGNOSIS — T451X5A Adverse effect of antineoplastic and immunosuppressive drugs, initial encounter: Secondary | ICD-10-CM | POA: Diagnosis present

## 2020-06-22 DIAGNOSIS — X58XXXA Exposure to other specified factors, initial encounter: Secondary | ICD-10-CM | POA: Diagnosis present

## 2020-06-22 DIAGNOSIS — G4733 Obstructive sleep apnea (adult) (pediatric): Secondary | ICD-10-CM | POA: Diagnosis present

## 2020-06-22 DIAGNOSIS — Z888 Allergy status to other drugs, medicaments and biological substances status: Secondary | ICD-10-CM

## 2020-06-22 DIAGNOSIS — Z7982 Long term (current) use of aspirin: Secondary | ICD-10-CM

## 2020-06-22 DIAGNOSIS — R7989 Other specified abnormal findings of blood chemistry: Secondary | ICD-10-CM | POA: Diagnosis present

## 2020-06-22 DIAGNOSIS — R000 Tachycardia, unspecified: Secondary | ICD-10-CM | POA: Diagnosis not present

## 2020-06-22 DIAGNOSIS — R4182 Altered mental status, unspecified: Secondary | ICD-10-CM | POA: Diagnosis not present

## 2020-06-22 MED ORDER — ONDANSETRON HCL 4 MG/2ML IJ SOLN
4.0000 mg | Freq: Once | INTRAMUSCULAR | Status: AC
  Administered 2020-06-23: 4 mg via INTRAVENOUS
  Filled 2020-06-22: qty 2

## 2020-06-22 MED ORDER — ETOMIDATE 2 MG/ML IV SOLN
0.1500 mg/kg | Freq: Once | INTRAVENOUS | Status: DC
  Filled 2020-06-22: qty 10

## 2020-06-22 NOTE — ED Provider Notes (Addendum)
TIME SEEN: 11:53 PM  CHIEF COMPLAINT: A. fib with RVR, possible fever, altered mental status  HPI: Patient is an 86 year old female with history of non-Hodgkin lymphoma with bony metastasis on chemotherapy, A. fib on Eliquis who presents to the emergency department EMS from [facility] with altered mental status, fever of 102.5 with EMS in A. fib with RVR.  Patient unable to provide much history.  Unable to get in touch with [facility] facility.  It appears that patient was just discharged from the hospital 06/11/20 for multifocal pneumonia and A. fib with RVR.  Was hospitalized on 06/02/2020.  It appears she is on chemotherapy and radiation.  Last chemo (cyclophosphamide/pentostatin/rituximab) was 06/19/2020.  Patient is a DNR per her paperwork.  ROS: Level 5 caveat secondary to altered mental status  PAST MEDICAL HISTORY/PAST SURGICAL HISTORY:  Past Medical History:  Diagnosis Date  . Arthritis   . Asthma   . Cancer ([place])    Lymphoma  . Cataract   . Chronic cough   . Diverticulosis of intestine    H/O  . DJD (degenerative joint disease) of lumbar spine   . IBS (irritable bowel syndrome)   . Left knee DJD   . Malignant lymphoplasmacytic lymphoma ([place]) 2004   Non-Hodgkin Lymphoma  . Right knee DJD   . Sleep apnea     MEDICATIONS:  Prior to Admission medications   Medication Sig Start Date End Date Taking? Authorizing Provider  acetaminophen (TYLENOL) 325 MG tablet Take 650 mg by mouth every 6 (six) hours as needed for mild pain.     [provider]  albuterol (PROVENTIL) (2.5 MG/3ML) 0.083% nebulizer solution Take 2.5 mg by nebulization in the morning and at bedtime.     [provider]  albuterol (VENTOLIN HFA) 108 (90 Base) MCG/ACT inhaler Inhale 2 puffs into the lungs every 6 (six) hours as needed for wheezing or shortness of breath.    [provider]  allopurinol (ZYLOPRIM) 100 MG tablet Take 1 tablet (100 mg total) by mouth daily. Patient  taking differently: Take 100 mg by mouth 2 (two) times daily.  05/09/20   Ladell Pier, MD  apixaban (ELIQUIS) 2.5 MG TABS tablet Take 1 tablet (2.5 mg total) by mouth 2 (two) times daily. 01/18/20   Sueanne Margarita, MD  calcium carbonate (TUMS - DOSED IN MG ELEMENTAL CALCIUM) 500 MG chewable tablet Chew 2 tablets by mouth 3 (three) times daily as needed for indigestion or heartburn.     [provider]  calcium-vitamin D (OSCAL WITH D) 500-200 MG-UNIT tablet Take 1 tablet by mouth daily with breakfast.    [provider]  cholestyramine (QUESTRAN) 4 g packet Take 4 g by mouth daily as needed (constipation).     [provider]  dextromethorphan (DELSYM) 30 MG/5ML liquid Take 60 mg by mouth 2 (two) times daily as needed for cough.     [provider]  diclofenac Sodium (VOLTAREN) 1 % GEL Apply 1 application topically at bedtime.    [provider]  diltiazem (CARDIZEM CD) 180 MG 24 hr capsule Take 1 capsule (180 mg total) by mouth daily. Please make yearly appt with Dr. Radford Pax in August for future refills. 1st attempt 01/18/20   Sueanne Margarita, MD  docusate sodium (COLACE) 100 MG capsule Take 100 mg by mouth daily.    [provider]  esomeprazole (NEXIUM) 40 MG capsule Take 40 mg by mouth See admin instructions. Take 40mg  daily and may take 40mg   daily as needed for acid reflux. 05/02/20   [provider]  ferrous sulfate 325 (65 FE) MG tablet Take 325 mg by mouth daily.     [provider]  fluticasone (FLONASE) 50 MCG/ACT nasal spray Place 1 spray into both nostrils 2 (two) times daily as needed for allergies or rhinitis.     [provider]  fluticasone furoate-vilanterol (BREO ELLIPTA) 200-25 MCG/INH AEPB Inhale 1 puff into the lungs daily.    [provider]  furosemide (LASIX) 40 MG tablet Take 40 mg by mouth See admin instructions. Was taking 40mg  daily. On 06/01/20 it was changed to 40mg  BID for 3 days due  to ankle edema.    [provider]  gabapentin (NEURONTIN) 100 MG capsule Take 100 mg by mouth 3 (three) times daily. 05/07/20   [provider]  guaiFENesin (MUCINEX) 600 MG 12 hr tablet Take 600 mg by mouth 2 (two) times daily.    [provider]  lactose free nutrition (BOOST) LIQD Take 237 mLs by mouth 2 (two) times daily between meals.     [provider]  naloxone [facility]) 0.4 MG/ML injection Inject 0.4 mg into the muscle as needed (opioid overdose).  05/27/20   [provider]  oxyCODONE (OXY IR/ROXICODONE) 5 MG immediate release tablet Take 1 tablet (5 mg total) by mouth every 4 (four) hours as needed for severe pain. 06/11/20   Arrien, Jimmy Picket, MD  polyethylene glycol powder [facility]) 17 GM/SCOOP powder Take 17 g by mouth daily as needed (constipation).     [provider]  potassium chloride (K-DUR,KLOR-CON) 10 MEQ tablet Take 10 mEq by mouth daily.  10/11/17   [provider]  pramoxine-hydrocortisone (ANALPRAM HC) cream Apply 1 application topically daily as needed (pain, itching).     [provider]    ALLERGIES:  Allergies  Allergen Reactions  . Codeine Other (See Comments)    Crazy-nightmares  . Amoxicillin Diarrhea        . Cefdinir Cough  . Doxycycline Diarrhea  . Erythromycin Other (See Comments)    GI upset  . Lisinopril Cough  . Oxybutynin Other (See Comments)    Dry mouth  . Sulfa Antibiotics Other (See Comments)    GI upset    SOCIAL HISTORY:  Social History   Tobacco Use  . Smoking status: Former Smoker    Packs/day: 1.00    Years: 25.00    Pack years: 25.00    Types: Cigarettes    Quit date: 08/04/1979    Years since quitting: 40.9  . Smokeless tobacco: Never Used  Substance Use Topics  . Alcohol use: No    FAMILY HISTORY: Family History  Problem Relation Age of Onset  . Stroke Father   . Heart failure Mother 100  . Breast cancer Sister     EXAM: BP 117/66 (BP  Location: Left Arm)   Pulse (!) 152   Resp (!) 32   SpO2 94%  CONSTITUTIONAL: Alert and oriented person only, obese, elderly, appears in distress HEAD: Normocephalic, appears atraumatic EYES: Conjunctivae clear, pupils appear equal, EOM appear intact ENT: normal nose; moist mucous membranes NECK: Supple, normal ROM, does not appear to have meningismus CARD: Regularly irregular and tachycardic; S1 and S2 appreciated; no murmurs, no clicks, no rubs, no gallops RESP: Normal chest excursion without splinting or tachypnea; breath sounds clear and equal bilaterally; no wheezes, no rhonchi, no rales, no hypoxia or respiratory distress, speaking full sentences ABD/GI: Normal bowel sounds;  non-distended; soft, non-tender, no rebound, no guarding, no peritoneal signs, no hepatosplenomegaly BACK:  The back appears normal EXT: Normal ROM in all joints; no deformity noted, no edema; no cyanosis SKIN: Normal color for age and race; warm; no rash on exposed skin NEURO: Moves all extremities equally, does not answer many questions or follow commands but does open eyes and will answer her name PSYCH: The patient's mood and manner are appropriate.   MEDICAL DECISION MAKING: Patient here with altered mental status, A. fib with RVR, temperature of 102.5 with EMS.  Concern for possible sepsis.  Heart rate is going up into the 180s.  She is on Eliquis and per her MAR it appears she is compliant.  I feel she may need cardioversion but how high her heart rate is in with her altered mental status and concern that she may not be perfusing her brain.  ED PROGRESS: Vital signs improving with fluids, Tylenol and diltiazem.    Patient now having some soft pressures on 5 mg/h of diltiazem.  We will continue to watch closely and give IV fluid boluses as needed but heart rates now in the 120s.  Labs show no significant acute abnormality.  Lactate normal.  Patient has stable chronic kidney disease and stable anemia.  Chest  x-ray concerning for worsening multifocal pneumonia.  Patient has multiple lytic lesions throughout the skull on head CT but no acute intracranial abnormality.  She has known history of lymphoma with bony metastasis.  Last chemo was 06/19/2020.  She is also receiving palliative radiation.  3:02 AM Discussed patient's case with hospitalist, Dr. Myna Hidalgo.  I have recommended admission and patient (and family if present) agree with this plan. Admitting physician will place admission orders.   I reviewed all nursing notes, vitals, pertinent previous records and reviewed/interpreted all EKGs, lab and urine results, imaging (as available).  3:15 AM  Dilt gtt held as patient has had some drop in blood pressures.  Another liter of lactated Ringer's has been ordered.  Recheck blood pressure 91/69 with a heart rate in the 120s.   Date: 06/23/2020 00:43   Rate: 152  Rhythm: A. fib with RVR  QRS Axis: normal  Intervals: IVCD  ST/T Wave abnormalities: normal  Conduction Disutrbances: none  Narrative Interpretation: A. fib with RVR, artifact, rate related ST depression   5:45 AM  Pt now in sinus rhythm with a rate in the 80s.  She is still off of diltiazem at this time.  Blood pressure currently 116/60.    EKG Interpretation  Date/Time:  Sunday June 23 2020 05:43:27 EST Ventricular Rate:  84 PR Interval:    QRS Duration: 135 QT Interval:  366 QTC Calculation: 433 R Axis:   26 Text Interpretation: Sinus rhythm Right bundle branch block No longer in a fib Confirmed by Pryor Curia [phone]) on 06/23/2020 5:49:10 AM        CRITICAL CARE Performed by: Cyril Mourning Chrishelle Zito   Total critical care time: 65 minutes  Critical care time was exclusive of separately billable procedures and treating other patients.  Critical care was necessary to treat or prevent imminent or life-threatening deterioration.  Critical care was time spent personally by me on the following activities: development of treatment  plan with patient and/or surrogate as well as nursing, discussions with consultants, evaluation of patient's response to treatment, examination of patient, obtaining history from patient or surrogate, ordering and performing treatments and interventions, ordering and review of laboratory studies, ordering and review of radiographic studies,  pulse oximetry and re-evaluation of patient's condition.   KATRIA BOTTS was evaluated in Emergency Department on 06/22/2020 for the symptoms described in the history of present illness. She was evaluated in the context of the global COVID-19 pandemic, which necessitated consideration that the patient might be at risk for infection with the SARS-CoV-2 virus that causes COVID-19. Institutional protocols and algorithms that pertain to the evaluation of patients at risk for COVID-19 are in a state of rapid change based on information released by regulatory bodies including the CDC and federal and state organizations. These policies and algorithms were followed during the patient's care in the ED.      Aloni Chuang, Delice Bison, DO 06/23/20 0302    Stpehanie Montroy, Delice Bison, DO 06/23/20 0316    Hadlie Gipson, Delice Bison, DO 06/23/20 [id number]

## 2020-06-23 ENCOUNTER — Encounter (HOSPITAL_COMMUNITY): Payer: Self-pay | Admitting: Family Medicine

## 2020-06-23 ENCOUNTER — Emergency Department (HOSPITAL_COMMUNITY): Payer: Medicare Other

## 2020-06-23 DIAGNOSIS — Z9981 Dependence on supplemental oxygen: Secondary | ICD-10-CM | POA: Diagnosis not present

## 2020-06-23 DIAGNOSIS — G9340 Encephalopathy, unspecified: Secondary | ICD-10-CM | POA: Diagnosis not present

## 2020-06-23 DIAGNOSIS — I5032 Chronic diastolic (congestive) heart failure: Secondary | ICD-10-CM | POA: Diagnosis present

## 2020-06-23 DIAGNOSIS — R001 Bradycardia, unspecified: Secondary | ICD-10-CM | POA: Diagnosis not present

## 2020-06-23 DIAGNOSIS — J440 Chronic obstructive pulmonary disease with acute lower respiratory infection: Secondary | ICD-10-CM | POA: Diagnosis present

## 2020-06-23 DIAGNOSIS — D61818 Other pancytopenia: Secondary | ICD-10-CM | POA: Diagnosis not present

## 2020-06-23 DIAGNOSIS — Z66 Do not resuscitate: Secondary | ICD-10-CM | POA: Diagnosis present

## 2020-06-23 DIAGNOSIS — M109 Gout, unspecified: Secondary | ICD-10-CM | POA: Diagnosis not present

## 2020-06-23 DIAGNOSIS — T451X5A Adverse effect of antineoplastic and immunosuppressive drugs, initial encounter: Secondary | ICD-10-CM | POA: Diagnosis present

## 2020-06-23 DIAGNOSIS — M25551 Pain in right hip: Secondary | ICD-10-CM | POA: Diagnosis not present

## 2020-06-23 DIAGNOSIS — G4733 Obstructive sleep apnea (adult) (pediatric): Secondary | ICD-10-CM | POA: Diagnosis not present

## 2020-06-23 DIAGNOSIS — C880 Waldenstrom macroglobulinemia: Secondary | ICD-10-CM | POA: Diagnosis present

## 2020-06-23 DIAGNOSIS — N1832 Chronic kidney disease, stage 3b: Secondary | ICD-10-CM | POA: Diagnosis present

## 2020-06-23 DIAGNOSIS — R410 Disorientation, unspecified: Secondary | ICD-10-CM | POA: Diagnosis not present

## 2020-06-23 DIAGNOSIS — J189 Pneumonia, unspecified organism: Secondary | ICD-10-CM | POA: Diagnosis present

## 2020-06-23 DIAGNOSIS — F0390 Unspecified dementia without behavioral disturbance: Secondary | ICD-10-CM | POA: Diagnosis not present

## 2020-06-23 DIAGNOSIS — D6481 Anemia due to antineoplastic chemotherapy: Secondary | ICD-10-CM | POA: Diagnosis present

## 2020-06-23 DIAGNOSIS — X58XXXA Exposure to other specified factors, initial encounter: Secondary | ICD-10-CM | POA: Diagnosis present

## 2020-06-23 DIAGNOSIS — C8590 Non-Hodgkin lymphoma, unspecified, unspecified site: Secondary | ICD-10-CM | POA: Diagnosis present

## 2020-06-23 DIAGNOSIS — M2550 Pain in unspecified joint: Secondary | ICD-10-CM | POA: Diagnosis not present

## 2020-06-23 DIAGNOSIS — D47Z9 Other specified neoplasms of uncertain behavior of lymphoid, hematopoietic and related tissue: Secondary | ICD-10-CM | POA: Diagnosis not present

## 2020-06-23 DIAGNOSIS — D709 Neutropenia, unspecified: Secondary | ICD-10-CM | POA: Diagnosis present

## 2020-06-23 DIAGNOSIS — R6520 Severe sepsis without septic shock: Secondary | ICD-10-CM | POA: Diagnosis present

## 2020-06-23 DIAGNOSIS — C7951 Secondary malignant neoplasm of bone: Secondary | ICD-10-CM | POA: Diagnosis present

## 2020-06-23 DIAGNOSIS — Z515 Encounter for palliative care: Secondary | ICD-10-CM | POA: Diagnosis not present

## 2020-06-23 DIAGNOSIS — A419 Sepsis, unspecified organism: Secondary | ICD-10-CM | POA: Diagnosis present

## 2020-06-23 DIAGNOSIS — Z6841 Body Mass Index (BMI) 40.0 and over, adult: Secondary | ICD-10-CM | POA: Diagnosis not present

## 2020-06-23 DIAGNOSIS — Z8673 Personal history of transient ischemic attack (TIA), and cerebral infarction without residual deficits: Secondary | ICD-10-CM | POA: Diagnosis not present

## 2020-06-23 DIAGNOSIS — N183 Chronic kidney disease, stage 3 unspecified: Secondary | ICD-10-CM | POA: Diagnosis not present

## 2020-06-23 DIAGNOSIS — Z7189 Other specified counseling: Secondary | ICD-10-CM | POA: Diagnosis not present

## 2020-06-23 DIAGNOSIS — J9601 Acute respiratory failure with hypoxia: Secondary | ICD-10-CM | POA: Diagnosis not present

## 2020-06-23 DIAGNOSIS — K219 Gastro-esophageal reflux disease without esophagitis: Secondary | ICD-10-CM | POA: Diagnosis not present

## 2020-06-23 DIAGNOSIS — I129 Hypertensive chronic kidney disease with stage 1 through stage 4 chronic kidney disease, or unspecified chronic kidney disease: Secondary | ICD-10-CM | POA: Diagnosis not present

## 2020-06-23 DIAGNOSIS — R531 Weakness: Secondary | ICD-10-CM | POA: Diagnosis not present

## 2020-06-23 DIAGNOSIS — F32A Depression, unspecified: Secondary | ICD-10-CM | POA: Diagnosis not present

## 2020-06-23 DIAGNOSIS — I4891 Unspecified atrial fibrillation: Secondary | ICD-10-CM

## 2020-06-23 DIAGNOSIS — G9341 Metabolic encephalopathy: Secondary | ICD-10-CM | POA: Diagnosis present

## 2020-06-23 DIAGNOSIS — L89156 Pressure-induced deep tissue damage of sacral region: Secondary | ICD-10-CM | POA: Diagnosis present

## 2020-06-23 DIAGNOSIS — R52 Pain, unspecified: Secondary | ICD-10-CM | POA: Diagnosis not present

## 2020-06-23 DIAGNOSIS — Z881 Allergy status to other antibiotic agents status: Secondary | ICD-10-CM | POA: Diagnosis not present

## 2020-06-23 DIAGNOSIS — Z20822 Contact with and (suspected) exposure to covid-19: Secondary | ICD-10-CM | POA: Diagnosis present

## 2020-06-23 DIAGNOSIS — E785 Hyperlipidemia, unspecified: Secondary | ICD-10-CM | POA: Diagnosis not present

## 2020-06-23 DIAGNOSIS — Z7401 Bed confinement status: Secondary | ICD-10-CM | POA: Diagnosis not present

## 2020-06-23 DIAGNOSIS — I130 Hypertensive heart and chronic kidney disease with heart failure and stage 1 through stage 4 chronic kidney disease, or unspecified chronic kidney disease: Secondary | ICD-10-CM | POA: Diagnosis present

## 2020-06-23 DIAGNOSIS — G893 Neoplasm related pain (acute) (chronic): Secondary | ICD-10-CM | POA: Diagnosis present

## 2020-06-23 DIAGNOSIS — R4182 Altered mental status, unspecified: Secondary | ICD-10-CM | POA: Diagnosis not present

## 2020-06-23 DIAGNOSIS — M25552 Pain in left hip: Secondary | ICD-10-CM | POA: Diagnosis not present

## 2020-06-23 LAB — COMPREHENSIVE METABOLIC PANEL
ALT: 35 U/L (ref 0–44)
ALT: 39 U/L (ref 0–44)
AST: 52 U/L — ABNORMAL HIGH (ref 15–41)
AST: 68 U/L — ABNORMAL HIGH (ref 15–41)
Albumin: 2.2 g/dL — ABNORMAL LOW (ref 3.5–5.0)
Albumin: 2.5 g/dL — ABNORMAL LOW (ref 3.5–5.0)
Alkaline Phosphatase: 130 U/L — ABNORMAL HIGH (ref 38–126)
Alkaline Phosphatase: 152 U/L — ABNORMAL HIGH (ref 38–126)
Anion gap: 10 (ref 5–15)
Anion gap: 11 (ref 5–15)
BUN: 36 mg/dL — ABNORMAL HIGH (ref 8–23)
BUN: 37 mg/dL — ABNORMAL HIGH (ref 8–23)
CO2: 20 mmol/L — ABNORMAL LOW (ref 22–32)
CO2: 22 mmol/L (ref 22–32)
Calcium: 8 mg/dL — ABNORMAL LOW (ref 8.9–10.3)
Calcium: 8.3 mg/dL — ABNORMAL LOW (ref 8.9–10.3)
Chloride: 105 mmol/L (ref 98–111)
Chloride: 105 mmol/L (ref 98–111)
Creatinine, Ser: 1.34 mg/dL — ABNORMAL HIGH (ref 0.44–1.00)
Creatinine, Ser: 1.39 mg/dL — ABNORMAL HIGH (ref 0.44–1.00)
GFR, Estimated: 37 mL/min — ABNORMAL LOW (ref >60)
GFR, Estimated: 38 mL/min — ABNORMAL LOW (ref >60)
Glucose, Bld: 104 mg/dL — ABNORMAL HIGH (ref 70–99)
Glucose, Bld: 108 mg/dL — ABNORMAL HIGH (ref 70–99)
Potassium: 4.9 mmol/L (ref 3.5–5.1)
Potassium: 5.3 mmol/L — ABNORMAL HIGH (ref 3.5–5.1)
Sodium: 135 mmol/L (ref 135–145)
Sodium: 138 mmol/L (ref 135–145)
Total Bilirubin: 2 mg/dL — ABNORMAL HIGH (ref 0.3–1.2)
Total Bilirubin: 2.1 mg/dL — ABNORMAL HIGH (ref 0.3–1.2)
Total Protein: 6.5 g/dL (ref 6.5–8.1)
Total Protein: 7.4 g/dL (ref 6.5–8.1)

## 2020-06-23 LAB — CBC WITH DIFFERENTIAL/PLATELET
Abs Immature Granulocytes: 0.09 10*3/uL — ABNORMAL HIGH (ref 0.00–0.07)
Basophils Absolute: 0 10*3/uL (ref 0.0–0.1)
Basophils Relative: 1 %
Eosinophils Absolute: 0 10*3/uL (ref 0.0–0.5)
Eosinophils Relative: 1 %
HCT: 25.7 % — ABNORMAL LOW (ref 36.0–46.0)
Hemoglobin: 7.7 g/dL — ABNORMAL LOW (ref 12.0–15.0)
Immature Granulocytes: 2 %
Lymphocytes Relative: 8 %
Lymphs Abs: 0.4 10*3/uL — ABNORMAL LOW (ref 0.7–4.0)
MCH: 27.8 pg (ref 26.0–34.0)
MCHC: 30 g/dL (ref 30.0–36.0)
MCV: 92.8 fL (ref 80.0–100.0)
Monocytes Absolute: 0.4 10*3/uL (ref 0.1–1.0)
Monocytes Relative: 8 %
Neutro Abs: 4.6 10*3/uL (ref 1.7–7.7)
Neutrophils Relative %: 80 %
Platelets: 608 10*3/uL — ABNORMAL HIGH (ref 150–400)
RBC: 2.77 MIL/uL — ABNORMAL LOW (ref 3.87–5.11)
RDW: 21.8 % — ABNORMAL HIGH (ref 11.5–15.5)
WBC: 5.6 10*3/uL (ref 4.0–10.5)
nRBC: 0 % (ref 0.0–0.2)

## 2020-06-23 LAB — URINALYSIS, ROUTINE W REFLEX MICROSCOPIC
Bilirubin Urine: NEGATIVE
Glucose, UA: NEGATIVE mg/dL
Ketones, ur: NEGATIVE mg/dL
Leukocytes,Ua: NEGATIVE
Nitrite: NEGATIVE
Protein, ur: 30 mg/dL — AB
RBC / HPF: >50 RBC/hpf — ABNORMAL HIGH (ref 0–5)
Specific Gravity, Urine: 1.016 (ref 1.005–1.030)
pH: 5 (ref 5.0–8.0)

## 2020-06-23 LAB — RESP PANEL BY RT-PCR (FLU A&B, COVID) ARPGX2
Influenza A by PCR: NEGATIVE
Influenza B by PCR: NEGATIVE
SARS Coronavirus 2 by RT PCR: NEGATIVE

## 2020-06-23 LAB — CBC
HCT: 20 % — ABNORMAL LOW (ref 36.0–46.0)
Hemoglobin: 6.7 g/dL — CL (ref 12.0–15.0)
MCH: 31.9 pg (ref 26.0–34.0)
MCHC: 33.5 g/dL (ref 30.0–36.0)
MCV: 95.2 fL (ref 80.0–100.0)
Platelets: 386 10*3/uL (ref 150–400)
RBC: 2.1 MIL/uL — ABNORMAL LOW (ref 3.87–5.11)
RDW: 23.9 % — ABNORMAL HIGH (ref 11.5–15.5)
WBC: 4.5 10*3/uL (ref 4.0–10.5)
nRBC: 0 % (ref 0.0–0.2)

## 2020-06-23 LAB — LACTIC ACID, PLASMA: Lactic Acid, Venous: 1.5 mmol/L (ref 0.5–1.9)

## 2020-06-23 LAB — MAGNESIUM: Magnesium: 2.2 mg/dL (ref 1.7–2.4)

## 2020-06-23 LAB — PREPARE RBC (CROSSMATCH)

## 2020-06-23 LAB — BRAIN NATRIURETIC PEPTIDE: B Natriuretic Peptide: 432.6 pg/mL — ABNORMAL HIGH (ref 0.0–100.0)

## 2020-06-23 MED ORDER — SODIUM CHLORIDE 0.9% FLUSH: 10.0000 mL | INTRAVENOUS | Status: DC | PRN

## 2020-06-23 MED ORDER — ACETAMINOPHEN 650 MG RE SUPP: 650.0000 mg | Freq: Four times a day (QID) | RECTAL | Status: DC | PRN

## 2020-06-23 MED ORDER — PRAVASTATIN SODIUM 20 MG PO TABS: 40.0000 mg | ORAL_TABLET | Freq: Every day | ORAL | Status: DC

## 2020-06-23 MED ORDER — POLYETHYLENE GLYCOL 3350 17 G PO PACK: 17.0000 g | PACK | Freq: Every day | ORAL | Status: DC | PRN

## 2020-06-23 MED ORDER — ASPIRIN 81 MG PO CHEW
81.0000 mg | CHEWABLE_TABLET | Freq: Every day | ORAL | Status: DC
  Administered 2020-06-23 – 2020-06-26 (×3): 81 mg via ORAL
  Filled 2020-06-23 (×4): qty 1

## 2020-06-23 MED ORDER — MEMANTINE HCL 10 MG PO TABS
10.0000 mg | ORAL_TABLET | Freq: Two times a day (BID) | ORAL | Status: DC
  Administered 2020-06-23: 10 mg via ORAL
  Filled 2020-06-23: qty 1

## 2020-06-23 MED ORDER — MIRTAZAPINE 15 MG PO TABS: 7.5000 mg | ORAL_TABLET | Freq: Every day | ORAL | Status: DC

## 2020-06-23 MED ORDER — ACETAMINOPHEN 650 MG RE SUPP
650.0000 mg | Freq: Once | RECTAL | Status: AC
  Administered 2020-06-23: 650 mg via RECTAL
  Filled 2020-06-23: qty 1

## 2020-06-23 MED ORDER — LACTATED RINGERS IV BOLUS (SEPSIS)
1000.0000 mL | Freq: Once | INTRAVENOUS | Status: AC
  Administered 2020-06-23: 1000 mL via INTRAVENOUS

## 2020-06-23 MED ORDER — TRAZODONE HCL 50 MG PO TABS: 50.0000 mg | ORAL_TABLET | Freq: Every day | ORAL | Status: DC

## 2020-06-23 MED ORDER — METRONIDAZOLE IN NACL 5-0.79 MG/ML-% IV SOLN
500.0000 mg | Freq: Once | INTRAVENOUS | Status: AC
  Administered 2020-06-23: 500 mg via INTRAVENOUS
  Filled 2020-06-23: qty 100

## 2020-06-23 MED ORDER — LACTATED RINGERS IV SOLN: INTRAVENOUS | Status: AC

## 2020-06-23 MED ORDER — SODIUM CHLORIDE 0.9% IV SOLUTION: Freq: Once | INTRAVENOUS | Status: DC

## 2020-06-23 MED ORDER — FAMOTIDINE 20 MG PO TABS
20.0000 mg | ORAL_TABLET | Freq: Every day | ORAL | Status: DC
  Administered 2020-06-23 – 2020-06-25 (×2): 20 mg via ORAL
  Filled 2020-06-23 (×3): qty 1

## 2020-06-23 MED ORDER — DILTIAZEM HCL-DEXTROSE 125-5 MG/125ML-% IV SOLN (PREMIX)
5.0000 mg/h | INTRAVENOUS | Status: DC
  Administered 2020-06-23: 5 mg/h via INTRAVENOUS
  Filled 2020-06-23: qty 125

## 2020-06-23 MED ORDER — LACTATED RINGERS IV SOLN: INTRAVENOUS | Status: DC

## 2020-06-23 MED ORDER — VANCOMYCIN HCL 1250 MG/250ML IV SOLN: 1250.0000 mg | INTRAVENOUS | Status: DC

## 2020-06-23 MED ORDER — VANCOMYCIN HCL 2000 MG/400ML IV SOLN
2000.0000 mg | Freq: Once | INTRAVENOUS | Status: AC
  Administered 2020-06-23: 2000 mg via INTRAVENOUS
  Filled 2020-06-23: qty 400

## 2020-06-23 MED ORDER — SODIUM CHLORIDE 0.9% FLUSH
3.0000 mL | Freq: Two times a day (BID) | INTRAVENOUS | Status: DC
  Administered 2020-06-23 – 2020-06-30 (×11): 3 mL via INTRAVENOUS

## 2020-06-23 MED ORDER — QUETIAPINE FUMARATE 25 MG PO TABS: 25.0000 mg | ORAL_TABLET | Freq: Every day | ORAL | Status: DC

## 2020-06-23 MED ORDER — DILTIAZEM HCL 25 MG/5ML IV SOLN
10.0000 mg | Freq: Once | INTRAVENOUS | Status: AC
  Administered 2020-06-23: 10 mg via INTRAVENOUS
  Filled 2020-06-23: qty 5

## 2020-06-23 MED ORDER — LORAZEPAM 0.5 MG PO TABS
0.5000 mg | ORAL_TABLET | Freq: Two times a day (BID) | ORAL | Status: DC
  Administered 2020-06-23: 0.5 mg via ORAL
  Filled 2020-06-23: qty 1

## 2020-06-23 MED ORDER — OXYCODONE-ACETAMINOPHEN 5-325 MG PO TABS
1.0000 | ORAL_TABLET | ORAL | Status: DC | PRN
  Administered 2020-06-23: 2 via ORAL
  Administered 2020-06-24: 1 via ORAL
  Filled 2020-06-23: qty 2
  Filled 2020-06-23: qty 1

## 2020-06-23 MED ORDER — SODIUM CHLORIDE 0.9% FLUSH
10.0000 mL | Freq: Two times a day (BID) | INTRAVENOUS | Status: DC
  Administered 2020-06-23 – 2020-06-24 (×3): 10 mL
  Administered 2020-06-25: 13 mL
  Administered 2020-06-25 – 2020-06-27 (×2): 10 mL
  Administered 2020-06-28: 2.5 mL
  Administered 2020-06-29: 10 mL

## 2020-06-23 MED ORDER — SODIUM CHLORIDE 0.9 % IV SOLN
2.0000 g | Freq: Two times a day (BID) | INTRAVENOUS | Status: DC
  Administered 2020-06-23 – 2020-06-26 (×7): 2 g via INTRAVENOUS
  Filled 2020-06-23 (×8): qty 2

## 2020-06-23 MED ORDER — ONDANSETRON HCL 4 MG/2ML IJ SOLN: 4.0000 mg | Freq: Four times a day (QID) | INTRAMUSCULAR | Status: DC | PRN

## 2020-06-23 MED ORDER — VANCOMYCIN HCL 1250 MG/250ML IV SOLN
1250.0000 mg | INTRAVENOUS | Status: DC
  Administered 2020-06-24 – 2020-06-26 (×3): 1250 mg via INTRAVENOUS
  Filled 2020-06-23 (×3): qty 250

## 2020-06-23 MED ORDER — ACETAMINOPHEN 325 MG PO TABS
650.0000 mg | ORAL_TABLET | Freq: Four times a day (QID) | ORAL | Status: DC | PRN
  Administered 2020-06-23 – 2020-06-25 (×3): 650 mg via ORAL
  Filled 2020-06-23 (×3): qty 2

## 2020-06-23 MED ORDER — VANCOMYCIN HCL IN DEXTROSE 1-5 GM/200ML-% IV SOLN: 1000.0000 mg | Freq: Once | INTRAVENOUS | Status: DC

## 2020-06-23 MED ORDER — ALLOPURINOL 100 MG PO TABS
100.0000 mg | ORAL_TABLET | Freq: Every day | ORAL | Status: DC
  Administered 2020-06-23 – 2020-07-01 (×4): 100 mg via ORAL
  Filled 2020-06-23 (×6): qty 1

## 2020-06-23 MED ORDER — LACTATED RINGERS IV BOLUS
1000.0000 mL | Freq: Once | INTRAVENOUS | Status: AC
  Administered 2020-06-23: 1000 mL via INTRAVENOUS

## 2020-06-23 MED ORDER — HEPARIN SODIUM (PORCINE) 5000 UNIT/ML IJ SOLN
5000.0000 [IU] | Freq: Three times a day (TID) | INTRAMUSCULAR | Status: DC
  Administered 2020-06-23 – 2020-06-24 (×4): 5000 [IU] via SUBCUTANEOUS
  Filled 2020-06-23 (×4): qty 1

## 2020-06-23 MED ORDER — SODIUM CHLORIDE 0.9 % IV SOLN
2.0000 g | Freq: Once | INTRAVENOUS | Status: AC
  Administered 2020-06-23: 2 g via INTRAVENOUS
  Filled 2020-06-23: qty 2

## 2020-06-23 MED ORDER — ONDANSETRON HCL 4 MG PO TABS: 4.0000 mg | ORAL_TABLET | Freq: Four times a day (QID) | ORAL | Status: DC | PRN

## 2020-06-23 MED ORDER — LACTATED RINGERS IV BOLUS (SEPSIS)
800.0000 mL | Freq: Once | INTRAVENOUS | Status: AC
  Administered 2020-06-23: 800 mL via INTRAVENOUS

## 2020-06-23 NOTE — ED Triage Notes (Signed)
Patient BIB EMS from [facility], staff there stated patient had a fever and was altered from baseline. Patient was recently diagnosed with pneumonia and has nonproductive cough. Patient with history of AFIB, arrives with HR in 160s. Patient is oriented only to her name at this time.

## 2020-06-23 NOTE — ED Notes (Signed)
Date and time results received: 06/23/20 0502 (use smartphrase ".now" to insert current time)  Test: Hemoglobin Critical Value: 6.7  Name of Provider Notified: Opyd, MD  Orders Received? Or Actions Taken?: Orders Received - See Orders for details

## 2020-06-23 NOTE — Progress Notes (Signed)
Advised by blood bank that unit of blood for transfusion was ready. Upon arrival to blood bank, charge nurse was instructed that blood needed a warmer. Md notified and order for warmer obtained from ED. ICU charge assisted with preparing warmer. Returned to blood bank to obtain blood for transfusing and was advised by lab technician that the blood needed further processing/matching and would notify RN when ready.Eulas Post, RN

## 2020-06-23 NOTE — Progress Notes (Signed)
A consult was received from an ED physician for aztreonam and vancomycin per pharmacy dosing.  The patient's profile has been reviewed for ht/wt/allergies/indication/available labs.   A one time order has been placed for aztreonam 2gm and vancomycin 2gm    Further antibiotics/pharmacy consults should be ordered by admitting physician if indicated.                       Thank you, Dolly Rias RPh 06/23/2020, 12:52 AM

## 2020-06-23 NOTE — H&P (Addendum)
History and Physical    KAREE FORGE HMC:[id number] DOB: 11/27/1930 DOA: 06/22/2020  PCP: Lavone Orn, MD   Patient coming from: SNF   Chief Complaint: Fever, cough, confusion   HPI: Natasha Ellis is a 86 y.o. female with medical history significant for non-Hodgkin's lymphoma, Waldenstrm's macroglobulinemia, atrial fibrillation recently taken off Eliquis, chronic kidney disease 3B, and recent admission with pneumonia and AKI, now presenting to the emergency department for evaluation of fever, cough, and confusion. Patient unable to provide much history in the emergency department. She denies pain. She denies feeling short of breath though appears dyspneic. She underwent chemotherapy on 06/19/2020 and recently received palliative radiation to osseous metastatic disease involving the hip.  ED Course: Upon arrival to the ED, patient is found to be febrile to 38.8 C, saturating in the low 90s on nasal cannula oxygen, tachypneic to the low 30s, tachycardic in the 662H, and with systolic blood pressure 94. Chemistry panel is notable for potassium 5.3, alkaline phosphatase 152, AST 68, total bilirubin 2.1, and creatinine 1.39, up from 1.24 few days ago. CBC features a hemoglobin of 7.7 and platelets of 608,000. Lactic acid is reassuringly normal. Urinalysis with moderate hemoglobin and 30 protein. Chest x-ray demonstrates increased patchy opacities concerning for multifocal pneumonia versus edema, as well as lytic rib lesions. Noncontrast head CT is negative for acute intracranial abnormality but demonstrates multiple lytic lesions throughout the skull and severe pansinus disease. Blood and urine cultures were collected and the patient was given 2.8 L of lactated Ringer's, Zofran, acetaminophen, vancomycin, Flagyl, as lactam, and was started on diltiazem infusion.  Review of Systems:  ROS is limited by the patient's clinical condition.  Past Medical History:  Diagnosis Date  . Arthritis   . Asthma    . Cancer ([place])    Lymphoma  . Cataract   . Chronic cough   . Diverticulosis of intestine    H/O  . DJD (degenerative joint disease) of lumbar spine   . IBS (irritable bowel syndrome)   . Left knee DJD   . 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 Right knee DJD   . Sleep apnea     Past Surgical History:  Procedure Laterality Date  . ABDOMINAL HYSTERECTOMY  1984   TAH  . EYE SURGERY Bilateral    with lens replacement  . EYE SURGERY Left    retnia  . KNEE ARTHROSCOPY Left   . NASAL SINUS SURGERY N/A 12/04/2015   Procedure: ENDOSCOPIC SINUS SURGERY;  Surgeon: Izora Gala, MD;  Location: [place];  Service: ENT;  Laterality: N/A;  . TEE WITHOUT CARDIOVERSION N/A 04/14/2016   Procedure: TRANSESOPHAGEAL ECHOCARDIOGRAM (TEE);  Surgeon: Lelon Perla, MD;  Location: [facility] ENDOSCOPY;  Service: Cardiovascular;  Laterality: N/A;  . TONSILLECTOMY AND ADENOIDECTOMY  childhood    Social History:   reports that she quit smoking about 40 years ago. Her smoking use included cigarettes. She has a 25.00 pack-year smoking history. She has never used smokeless tobacco. She reports that she does not drink alcohol and does not use drugs.  Allergies  Allergen Reactions  . Codeine Other (See Comments)    Crazy-nightmares  . Amoxicillin Diarrhea        . Buspar [Buspirone] Other (See Comments)    Unknown -- OFF MAR   . Cefdinir Cough  . Doxycycline Diarrhea  . Erythromycin Other (See Comments)    GI upset  . Lisinopril Cough  . Morphine And Related Other (  See Comments)    Unknown -- of MAR   . Myrbetriq Andree Elk Er] Other (See Comments)    Off MAR unknown reaction  . Oxybutynin Other (See Comments)    Dry mouth  . Sulfa Antibiotics Other (See Comments)    GI upset  . Toviaz [Fesoterodine Fumarate Er] Other (See Comments)    Unknown off MAR   . Tramadol Hcl Other (See Comments)    Unknown reaction --OFF MAR   . Vesicare [Solifenacin Succinate] Other  (See Comments)    Unknown off MAR    Family History  Problem Relation Age of Onset  . Stroke Father   . Heart failure Mother 100  . Breast cancer Sister      Prior to Admission medications   Medication Sig Start Date End Date Taking? Authorizing Provider  acetaminophen (TYLENOL) 325 MG tablet Take 650 mg by mouth every 6 (six) hours as needed for mild pain.    Yes [provider]  allopurinol (ZYLOPRIM) 100 MG tablet Take 1 tablet (100 mg total) by mouth daily. 05/09/20  Yes Ladell Pier, MD  aspirin 81 MG chewable tablet Chew 81 mg by mouth daily.   Yes [provider]  docusate sodium (COLACE) 100 MG capsule Take 100 mg by mouth 2 (two) times daily.    Yes [provider]  famotidine (PEPCID) 20 MG tablet Take 20 mg by mouth daily.   Yes [provider]  hydrOXYzine (VISTARIL) 25 MG capsule Take 25 mg by mouth every 4 (four) hours as needed.   Yes [provider]  LORazepam (ATIVAN) 0.5 MG tablet Take 0.5 mg by mouth 2 (two) times daily.   Yes [provider]  memantine (NAMENDA) 10 MG tablet Take 10 mg by mouth 2 (two) times daily.   Yes [provider]  mirtazapine (REMERON) 7.5 MG tablet Take 7.5 mg by mouth at bedtime.   Yes [provider]  nitroGLYCERIN (NITROSTAT) 0.4 MG SL tablet Place 0.4 mg under the tongue every 5 (five) minutes as needed for chest pain.   Yes [provider]  Nutritional Supplements (ENSURE ORIGINAL PO) Take 1 Bottle by mouth 2 (two) times daily.   Yes [provider]  polyethylene glycol powder (MIRALAX) 17 GM/SCOOP powder Take 17 g by mouth daily as needed (constipation).    Yes [provider]  pravastatin (PRAVACHOL) 40 MG tablet Take 40 mg by mouth daily.   Yes [provider]  QUEtiapine (SEROQUEL) 25 MG tablet Take 25 mg by mouth daily. At 3pm   Yes [provider]  traZODone (DESYREL) 50 MG tablet Take 50 mg by mouth at bedtime.    Yes [provider]    Physical Exam: Vitals:   06/23/20 0345 06/23/20 0400 06/23/20 0415 06/23/20 0430  BP: 111/71 1'04/67 94/75 95/70 '  Pulse: (!) 131 68 (!) 121 (!) 151  Resp: 15 18 (!) 21 18  Temp:      TempSrc:      SpO2: 98% 98% 97% 97%  Weight:      Height:        Constitutional: Not in acute respiratory distress, calm  Eyes: PERTLA, lids and conjunctivae normal ENMT: Mucous membranes are moist. Posterior pharynx clear of any exudate or lesions.   Neck: normal, supple, no masses, no thyromegaly Respiratory: Tachypnea, coarse rhonchi on left. No cyanosis.  Cardiovascular: Rate ~120 and irregularly irregular. No extremity edema.  Abdomen: No distension, no tenderness, soft. Bowel sounds active.  Musculoskeletal: no clubbing / cyanosis. No joint deformity upper and lower extremities.   Skin: no significant rashes, lesions, ulcers. Warm, dry, well-perfused. Neurologic: No gross facial asymmetry, PERRL, gross hearing deficit. Sensation intact. Moving all extremities.  Psychiatric: Awake. Oriented to person only. Calm and cooperative.    Labs and Imaging on Admission: I have personally reviewed following labs and imaging studies  CBC: Recent Labs  Lab 06/19/20 1015 06/22/20 2333  WBC 3.9* 5.6  NEUTROABS 2.2 4.6  HGB 7.2* 7.7*  HCT 20.4* 25.7*  MCV 93.2 92.8  PLT 430* 947*   Basic Metabolic Panel: Recent Labs  Lab 06/19/20 1015 06/22/20 2333 06/22/20 2355  NA 137 135  --   K 4.7 5.3*  --   CL 103 105  --   CO2 27 20*  --   GLUCOSE 102* 108*  --   BUN 25* 36*  --   CREATININE 1.24* 1.39*  --   CALCIUM 8.8* 8.3*  --   MG  --   --  2.2   GFR: Estimated Creatinine Clearance: 37.8 mL/min (A) (by C-G formula based on SCr of 1.39 mg/dL (H)). Liver Function Tests: Recent Labs  Lab 06/19/20 1015 06/22/20 2333  AST 29 68*  ALT 15 39  ALKPHOS 178* 152*  BILITOT 1.3* 2.1*  PROT 7.4 7.4  ALBUMIN 2.3* 2.5*   No results for input(s): LIPASE, AMYLASE in  the last 168 hours. No results for input(s): AMMONIA in the last 168 hours. Coagulation Profile: No results for input(s): INR, PROTIME in the last 168 hours. Cardiac Enzymes: No results for input(s): CKTOTAL, CKMB, CKMBINDEX, TROPONINI in the last 168 hours. BNP (last 3 results) Recent Labs    09/13/19 0938  PROBNP 133.0*   HbA1C: No results for input(s): HGBA1C in the last 72 hours. CBG: No results for input(s): GLUCAP in the last 168 hours. Lipid Profile: No results for input(s): CHOL, HDL, LDLCALC, TRIG, CHOLHDL, LDLDIRECT in the last 72 hours. Thyroid Function Tests: No results for input(s): TSH, T4TOTAL, FREET4, T3FREE, THYROIDAB in the last 72 hours. Anemia Panel: No results for input(s): VITAMINB12, FOLATE, FERRITIN, TIBC, IRON, RETICCTPCT in the last 72 hours. Urine analysis:    Component Value Date/Time   COLORURINE AMBER (A) 06/22/2020 2333   APPEARANCEUR HAZY (A) 06/22/2020 2333   LABSPEC 1.016 06/22/2020 2333   PHURINE 5.0 06/22/2020 2333   GLUCOSEU NEGATIVE 06/22/2020 2333   HGBUR MODERATE (A) 06/22/2020 2333   BILIRUBINUR NEGATIVE 06/22/2020 2333   BILIRUBINUR neg 04/20/2013 1728   KETONESUR NEGATIVE 06/22/2020 2333   PROTEINUR 30 (A) 06/22/2020 2333   UROBILINOGEN 0.2 05/21/2015 1920   NITRITE NEGATIVE 06/22/2020 2333   LEUKOCYTESUR NEGATIVE 06/22/2020 2333   Sepsis Labs: '@LABRCNTIP' (procalcitonin:4,lacticidven:4) ) Recent Results (from the past 240 hour(s))  Resp Panel by RT-PCR (Flu A&B, Covid) Nasopharyngeal Swab     Status: None   Collection Time: 06/23/20 12:30 AM   Specimen: Nasopharyngeal Swab; Nasopharyngeal(NP) swabs in vial transport medium  Result Value Ref Range Status   SARS Coronavirus 2 by RT PCR NEGATIVE NEGATIVE Final    Comment: (NOTE) SARS-CoV-2 target nucleic acids are NOT DETECTED.  The SARS-CoV-2 RNA is generally detectable in upper respiratory specimens during the acute phase of infection. The lowest concentration of SARS-CoV-2  viral copies this assay can detect is 138 copies/mL. A negative result does not preclude SARS-Cov-2 infection and should not be used as the sole basis for treatment or other patient management decisions. A negative result may occur  with  improper specimen collection/handling, submission of specimen other than nasopharyngeal swab, presence of viral mutation(s) within the areas targeted by this assay, and inadequate number of viral copies(<138 copies/mL). A negative result must be combined with clinical observations, patient history, and epidemiological information. The expected result is Negative.  Fact Sheet for Patients:  EntrepreneurPulse.com.au  Fact Sheet for Healthcare Providers:  IncredibleEmployment.be  This test is no t yet approved or cleared by the [place] FDA and  has been authorized for detection and/or diagnosis of SARS-CoV-2 by FDA under an Emergency Use Authorization (EUA). This EUA will remain  in effect (meaning this test can be used) for the duration of the COVID-19 declaration under Section 564(b)(1) of the Act, 21 U.S.C.section 360bbb-3(b)(1), unless the authorization is terminated  or revoked sooner.       Influenza A by PCR NEGATIVE NEGATIVE Final   Influenza B by PCR NEGATIVE NEGATIVE Final    Comment: (NOTE) The Xpert Xpress SARS-CoV-2/FLU/RSV plus assay is intended as an aid in the diagnosis of influenza from Nasopharyngeal swab specimens and should not be used as a sole basis for treatment. Nasal washings and aspirates are unacceptable for Xpert Xpress SARS-CoV-2/FLU/RSV testing.  Fact Sheet for Patients: EntrepreneurPulse.com.au  Fact Sheet for Healthcare Providers: IncredibleEmployment.be  This test is not yet approved or cleared by the [place] FDA and has been authorized for detection and/or diagnosis of SARS-CoV-2 by FDA under an Emergency Use Authorization  (EUA). This EUA will remain in effect (meaning this test can be used) for the duration of the COVID-19 declaration under Section 564(b)(1) of the Act, 21 U.S.C. section 360bbb-3(b)(1), unless the authorization is terminated or revoked.  Performed at [facility], [place] [address], [place], [place] [address]      Radiological Exams on Admission: CT Head Wo Contrast  Result Date: 06/23/2020 CLINICAL DATA:  Altered mental status. EXAM: CT HEAD WITHOUT CONTRAST TECHNIQUE: Contiguous axial images were obtained from the base of the skull through the vertex without intravenous contrast. COMPARISON:  January 14, 2016 FINDINGS: Brain: There is moderate severity cerebral atrophy with widening of the extra-axial spaces and ventricular dilatation. There are areas of decreased attenuation within the white matter tracts of the supratentorial brain, consistent with microvascular disease changes. Vascular: No hyperdense vessel or unexpected calcification. Skull: Negative for an acute fracture. Multiple lytic lesions are seen scattered throughout the skull. The largest is seen within the right parietal region and measures approximately 2.4 cm x 0.6 cm. This represents a new finding when compared to the prior exam. Sinuses/Orbits: There is marked severity sphenoid sinus, bilateral ethmoid sinus and bilateral maxillary sinus mucosal thickening. Other: None. IMPRESSION: 1. Multiple lytic lesions scattered throughout the skull, concerning for multiple myeloma or metastatic disease. 2. Marked severity pansinus disease. 3. No acute intracranial abnormality. Electronically Signed   By: Virgina Norfolk M.D.   On: 06/23/2020 02:33   DG Chest Port 1 View  Result Date: 06/22/2020 CLINICAL DATA:  Suspected sepsis, shortness of breath, history of smoking, asthma EXAM: PORTABLE CHEST 1 VIEW COMPARISON:  Radiograph 06/11/2020, CT 03/18/2020 FINDINGS: Increasing patchy opacities most coalescent in the left lung  base/retrocardiac space and partially silhouetting the left hemidiaphragm with more diffuse hazy interstitial opacity elsewhere throughout both lungs. No pneumothorax or visible effusion. The aorta is calcified. The remaining cardiomediastinal contours are unremarkable. Redemonstrated expansile lesions in the left third and fourth ribs, similar to comparison CT. No new lytic or blastic lesions are seen. The osseous structures  appear diffusely demineralized which may limit detection of small or nondisplaced fractures. Degenerative changes are present in the imaged spine and shoulders. IMPRESSION: Increasing patchy opacities most coalescent in the left lung base/retrocardiac space and partially silhouetting the left hemidiaphragm with more diffuse hazy interstitial opacity elsewhere throughout both lungs. Findings could reflect multifocal pneumonia versus edema. Expansile lytic lesions in the left third and fourth ribs, similar to comparison CT. Electronically Signed   By: Lovena Le M.D.   On: 06/22/2020 23:52    Assessment/Plan   1. Severe sepsis suspected secondary to multifocal PNA  - Presents from SNF with fever and confusion, found to be febrile and tachycardic with normal lactate, no meningismus, no evidence for infection on UA, and CXR concerning for worsening multifocal pneumonia  - Blood and urine cultures were collected in ED, she was given 1.8 liters of LR, then another liter LR for SBP 70s, and started on broad-spectrum antibiotics  - BP responding to IVF  - Continue empiric antibiotics, follow cultures and clinical course    2. Atrial fibrillation with RVR  - Presents with fever and confusion and is found to be in atrial fibrillation with rat 150s  - CHADS-VASc at least (age x2, gender), Eliquis no longer on med list  - Continue IV diltiazem infusion with titration as BP allows    3. Acute encephalopathy  - Presents from SNF with fevers and AMS, found to have sepsis with increased  opacities on CXR concerning for multifocal pneumonia  - No acute intracranial abnormalities noted on head CT and no meningismus  - Likely secondary to sepsis, will continue to treat sepsis and hold sedating medications for now    4. Waldenstrom's macroglobulinemia; B-cell non-Hodgkin lymphoma  - She completed cycle 3 of cyclophosphamide/pentostatin/rituximab on 06/19/20, recently had palliative radiation to hip osseous metastatic disease   - Hgb is 7.7 on admission, no bleeding identified, will monitor and transfuse if needed   5. CKD IIIb  - SCr is 1.39 on admissions, close to apparent baseline  - Renally-dose medications, monitor    6. Cancer-related pain  - No pain complaints on admission, holding sedating medications for now   7. Elevated LFTs  - Alk phos 152, AST 68, and t bili 2.1 on admission  - Abdominal exam benign, will repeat CMP in am    DVT prophylaxis: sq heparin  Code Status: DNR, paperwork at bedside Family Communication: None available at time of admission  Disposition Plan:  Patient is from: SNF  Anticipated d/c is to: SNF  Anticipated d/c date is: 06/27/20 Patient currently: Septic and in atrial fib with RVR  Consults called: None  Admission status: Inpatient     Vianne Bulls, MD [facility]  06/23/2020, 4:34 AM

## 2020-06-23 NOTE — Progress Notes (Signed)
Natasha Ellis is a 86 y.o. female with medical history significant for non-Hodgkin's lymphoma, Waldenstrm's macroglobulinemia, atrial fibrillation recently taken off Eliquis, chronic kidney disease 3B, and recent admission with pneumonia and AKI, now presenting to the emergency department for evaluation of fever, cough, and confusion.  She was admitted for recurrent for multifocal pneumonia. She was also found ot have anemia hemoglobin around 6. 7. She is scheduled to receive 1 unit of prbc transfusion. She was started on broad spectrum IV antibiotics.   Pt seen and examined at bedside.  She is hard of hearing . Discussed with the patient's daughter the plan of care.   Hosie Poisson, MD

## 2020-06-23 NOTE — Progress Notes (Signed)
Oncoming nurse informed that blood bank will notify her when blood processing has been completed and ready for pick up. Eulas Post, RN

## 2020-06-23 NOTE — Progress Notes (Signed)
Patient declines the use of nocturnal CPAP.

## 2020-06-23 NOTE — Progress Notes (Signed)
Code Sepsis initiated at [place] following.

## 2020-06-23 NOTE — Progress Notes (Signed)
Pharmacy Antibiotic Note  Natasha Ellis is a 86 y.o. female admitted on 06/22/2020 with history of non-Hodgkin lymphoma with bony metastasis on chemotherapy, A. fib on Eliquis who presents to theED from [facility] with altered mental status, fever of 102.5 with EMS in A. fib with RVR.Marland Kitchen  Pharmacy has been consulted for vancomycin and cefepime for sepsis.  Plan: Vancomycin 1250mg  IV q24h (received 2gm load earlier) Cefepime 2gm IV q12h Follow renal function, cultures and clinical course  Height: 5\' 6"  (167.6 cm) Weight: 121 kg (266 lb 12.1 oz) IBW/kg (Calculated) : 59.3  Temp (24hrs), Avg:101.8 F (38.8 C), Min:101.8 F (38.8 C), Max:101.8 F (38.8 C)  Recent Labs  Lab 06/19/20 1015 06/22/20 2333 06/23/20 0045  WBC 3.9* 5.6  --   CREATININE 1.24* 1.39*  --   LATICACIDVEN  --   --  1.5    Estimated Creatinine Clearance: 37.8 mL/min (A) (by C-G formula based on SCr of 1.39 mg/dL (H)).    Allergies  Allergen Reactions  . Codeine Other (See Comments)    Crazy-nightmares  . Amoxicillin Diarrhea        . Buspar [Buspirone] Other (See Comments)    Unknown -- OFF MAR   . Cefdinir Cough  . Doxycycline Diarrhea  . Erythromycin Other (See Comments)    GI upset  . Lisinopril Cough  . Morphine And Related Other (See Comments)    Unknown -- of MAR   . Myrbetriq Andree Elk Er] Other (See Comments)    Off MAR unknown reaction  . Oxybutynin Other (See Comments)    Dry mouth  . Sulfa Antibiotics Other (See Comments)    GI upset  . Toviaz [Fesoterodine Fumarate Er] Other (See Comments)    Unknown off MAR   . Tramadol Hcl Other (See Comments)    Unknown reaction --OFF MAR   . Vesicare [Solifenacin Succinate] Other (See Comments)    Unknown off MAR    Antimicrobials this admission: 11/21 aztrenam x 1 11/21 vancomycin >> 11/21 cefepime >> Dose adjustments this admission:   Microbiology results: 11/20 BCx:  11/20 UCx:     Thank you for allowing pharmacy to be a part of  this patient's care.  Dolly Rias RPh 06/23/2020, 4:43 AM

## 2020-06-24 ENCOUNTER — Inpatient Hospital Stay (HOSPITAL_COMMUNITY): Payer: Medicare Other

## 2020-06-24 DIAGNOSIS — L8990 Pressure ulcer of unspecified site, unspecified stage: Secondary | ICD-10-CM | POA: Insufficient documentation

## 2020-06-24 DIAGNOSIS — J189 Pneumonia, unspecified organism: Secondary | ICD-10-CM | POA: Diagnosis not present

## 2020-06-24 DIAGNOSIS — N1832 Chronic kidney disease, stage 3b: Secondary | ICD-10-CM

## 2020-06-24 DIAGNOSIS — I4891 Unspecified atrial fibrillation: Secondary | ICD-10-CM | POA: Diagnosis not present

## 2020-06-24 DIAGNOSIS — C7951 Secondary malignant neoplasm of bone: Secondary | ICD-10-CM | POA: Diagnosis not present

## 2020-06-24 LAB — BLOOD CULTURE ID PANEL (REFLEXED) - BCID2

## 2020-06-24 LAB — COMPREHENSIVE METABOLIC PANEL
ALT: 32 U/L (ref 0–44)
AST: 41 U/L (ref 15–41)
Albumin: 2.2 g/dL — ABNORMAL LOW (ref 3.5–5.0)
Alkaline Phosphatase: 121 U/L (ref 38–126)
Anion gap: 11 (ref 5–15)
BUN: 40 mg/dL — ABNORMAL HIGH (ref 8–23)
CO2: 23 mmol/L (ref 22–32)
Calcium: 8.3 mg/dL — ABNORMAL LOW (ref 8.9–10.3)
Chloride: 106 mmol/L (ref 98–111)
Creatinine, Ser: 1.33 mg/dL — ABNORMAL HIGH (ref 0.44–1.00)
GFR, Estimated: 39 mL/min — ABNORMAL LOW (ref >60)
Glucose, Bld: 103 mg/dL — ABNORMAL HIGH (ref 70–99)
Potassium: 4.6 mmol/L (ref 3.5–5.1)
Sodium: 140 mmol/L (ref 135–145)
Total Bilirubin: 2.4 mg/dL — ABNORMAL HIGH (ref 0.3–1.2)
Total Protein: 6.5 g/dL (ref 6.5–8.1)

## 2020-06-24 LAB — CBC
HCT: 22.8 % — ABNORMAL LOW (ref 36.0–46.0)
Hemoglobin: 7.2 g/dL — ABNORMAL LOW (ref 12.0–15.0)
MCH: 29.4 pg (ref 26.0–34.0)
MCHC: 31.6 g/dL (ref 30.0–36.0)
MCV: 93.1 fL (ref 80.0–100.0)
Platelets: 339 10*3/uL (ref 150–400)
RBC: 2.45 MIL/uL — ABNORMAL LOW (ref 3.87–5.11)
RDW: 22.5 % — ABNORMAL HIGH (ref 11.5–15.5)
WBC: 2.3 10*3/uL — ABNORMAL LOW (ref 4.0–10.5)
nRBC: 0 % (ref 0.0–0.2)

## 2020-06-24 LAB — BPAM RBC
Blood Product Expiration Date: 202112242359
ISSUE DATE / TIME: 202111212015
Unit Type and Rh: 9500

## 2020-06-24 LAB — TYPE AND SCREEN
ABO/RH(D): AB POS
Antibody Screen: POSITIVE
DAT, IgG: NEGATIVE
Unit division: 0

## 2020-06-24 LAB — URINE CULTURE: Culture: NO GROWTH

## 2020-06-24 MED ORDER — ORAL CARE MOUTH RINSE
15.0000 mL | Freq: Two times a day (BID) | OROMUCOSAL | Status: DC
  Administered 2020-06-24 – 2020-07-01 (×12): 15 mL via OROMUCOSAL

## 2020-06-24 MED ORDER — DILTIAZEM HCL-DEXTROSE 125-5 MG/125ML-% IV SOLN (PREMIX)
5.0000 mg/h | INTRAVENOUS | Status: DC
  Administered 2020-06-24: 5 mg/h via INTRAVENOUS
  Administered 2020-06-25: 10 mg/h via INTRAVENOUS
  Filled 2020-06-24 (×2): qty 125

## 2020-06-24 MED ORDER — FENTANYL 50 MCG/HR TD PT72
1.0000 | MEDICATED_PATCH | TRANSDERMAL | Status: DC
  Administered 2020-06-24 – 2020-06-27 (×2): 1 via TRANSDERMAL
  Filled 2020-06-24 (×2): qty 1

## 2020-06-24 MED ORDER — GABAPENTIN 100 MG PO CAPS
100.0000 mg | ORAL_CAPSULE | Freq: Three times a day (TID) | ORAL | Status: DC
  Administered 2020-06-24 – 2020-06-26 (×6): 100 mg via ORAL
  Filled 2020-06-24 (×7): qty 1

## 2020-06-24 MED ORDER — FENTANYL CITRATE (PF) 100 MCG/2ML IJ SOLN
25.0000 ug | INTRAMUSCULAR | Status: DC | PRN
  Administered 2020-06-24: 25 ug via INTRAVENOUS
  Filled 2020-06-24: qty 2

## 2020-06-24 MED ORDER — FUROSEMIDE 40 MG PO TABS
40.0000 mg | ORAL_TABLET | Freq: Every day | ORAL | Status: DC
  Administered 2020-06-24 – 2020-06-26 (×3): 40 mg via ORAL
  Filled 2020-06-24 (×3): qty 1

## 2020-06-24 MED ORDER — HYDROMORPHONE HCL 1 MG/ML IJ SOLN
1.0000 mg | INTRAMUSCULAR | Status: DC | PRN
  Administered 2020-06-24: 1 mg via INTRAVENOUS
  Filled 2020-06-24: qty 1

## 2020-06-24 MED ORDER — APIXABAN 2.5 MG PO TABS
2.5000 mg | ORAL_TABLET | Freq: Two times a day (BID) | ORAL | Status: DC
  Administered 2020-06-24 – 2020-06-26 (×4): 2.5 mg via ORAL
  Filled 2020-06-24 (×5): qty 1

## 2020-06-24 MED ORDER — DILTIAZEM HCL ER COATED BEADS 180 MG PO CP24
180.0000 mg | ORAL_CAPSULE | Freq: Every day | ORAL | Status: DC
  Administered 2020-06-24: 180 mg via ORAL
  Filled 2020-06-24: qty 1

## 2020-06-24 MED ORDER — CHOLESTYRAMINE LIGHT 4 G PO PACK
4.0000 g | PACK | Freq: Once | ORAL | Status: DC | PRN
  Filled 2020-06-24: qty 1

## 2020-06-24 MED ORDER — DILTIAZEM HCL 25 MG/5ML IV SOLN
10.0000 mg | Freq: Once | INTRAVENOUS | Status: AC
  Administered 2020-06-24: 10 mg via INTRAVENOUS
  Filled 2020-06-24: qty 5

## 2020-06-24 MED ORDER — METOPROLOL TARTRATE 5 MG/5ML IV SOLN
5.0000 mg | Freq: Once | INTRAVENOUS | Status: AC
  Administered 2020-06-24: 5 mg via INTRAVENOUS
  Filled 2020-06-24: qty 5

## 2020-06-24 MED ORDER — PANTOPRAZOLE SODIUM 40 MG PO TBEC
40.0000 mg | DELAYED_RELEASE_TABLET | Freq: Every day | ORAL | Status: DC
  Administered 2020-06-24 – 2020-06-26 (×3): 40 mg via ORAL
  Filled 2020-06-24 (×3): qty 1

## 2020-06-24 NOTE — Evaluation (Signed)
Physical Therapy Evaluation Patient Details Name: Natasha Ellis MRN: [id number] DOB: Dec 23, 1930 Today's Date: 06/24/2020   History of Present Illness  Pt is an 86 year old female with recent admission with pneumonia and AKI, now presenting to the emergency department for evaluation of fever, cough, and confusion. She was admitted for recurrent for multifocal pneumonia. She was also found ot have anemia hemoglobin around 6. 7 and received 1 unit of PRBCs.  Pt also with past medical history for Waledestrom's, non-Hodgkin's lymphoma, known metastatic disease and bone marrow infiltration: painful bony metastatic lesions of sacrum, ribs, L proximal femur, pelvis, L5 compression fracture  Clinical Impression  Pt admitted with above diagnosis.  Pt currently with functional limitations due to the deficits listed below (see PT Problem List). Pt will benefit from skilled PT to increase their independence and safety with mobility to allow discharge to the venue listed below.   Pt with recent admission and unable to tolerate OOB activity due to pain. Pt premedicated with pain meds however very lethargic during session.  Pt also unable to tolerate assisted movement or bed positioning due to reported LE pain and back pain.  Pt admitted from SNF.  Uncertain if pt was participating/tolerating therapy at SNF so will defer therapy needs to SNF.     Follow Up Recommendations SNF    Equipment Recommendations  None recommended by PT    Recommendations for Other Services       Precautions / Restrictions Precautions Precautions: Fall Precaution Comments: Pain Restrictions Weight Bearing Restrictions: No Other Position/Activity Restrictions: unsure of any WB restrictions with multiple bony lesions, however from chart review, pt was performing sit to stands at SNF (see 06/19/20 oncology note)      Mobility  Bed Mobility Overal bed mobility: Needs Assistance             General bed mobility comments: pt  not tolerating PROM of LEs; attempted to place bed in chair position however pt not tolerating due to pain    Transfers                    Ambulation/Gait                Stairs            Wheelchair Mobility    Modified Rankin (Stroke Patients Only)       Balance                                             Pertinent Vitals/Pain Pain Assessment: Faces Faces Pain Scale: Hurts whole lot Pain Location: back and LEs Pain Descriptors / Indicators: Discomfort;Grimacing;Guarding;Moaning Pain Intervention(s): Monitored during session;Premedicated before session;Repositioned    Home Living Family/patient expects to be discharged to:: Skilled nursing facility                      Prior Function Level of Independence: Needs assistance;Independent with assistive device(s)   Gait / Transfers Assistance Needed: per last admission: "reports using rollator for ambulation to bathroom. Reports limited mobility secondary to pain."  ADL's / Homemaking Assistance Needed: Assistance with all ADLs.        Hand Dominance        Extremity/Trunk Assessment        Lower Extremity Assessment Lower Extremity Assessment: Generalized weakness RLE Deficits / Details: attempted to  perform gentle PROM however pt grimacing in pain with little movement and guarding LLE Deficits / Details: attempted to perform gentle PROM however pt grimacing in pain with little movement and guarding       Communication   Communication: No difficulties  Cognition Arousal/Alertness: Lethargic;Suspect due to medications                                            General Comments      Exercises     Assessment/Plan    PT Assessment Patient needs continued PT services  PT Problem List Decreased strength;Decreased range of motion;Decreased knowledge of use of DME;Decreased activity tolerance;Pain;Decreased mobility;Decreased cognition        PT Treatment Interventions Functional mobility training;Therapeutic activities;Therapeutic exercise;Patient/family education;Wheelchair mobility training    PT Goals (Current goals can be found in the Care Plan section)  Acute Rehab PT Goals PT Goal Formulation: Patient unable to participate in goal setting Time For Goal Achievement: 07/08/20 Potential to Achieve Goals: Fair    Frequency Min 2X/week   Barriers to discharge        Co-evaluation               AM-PAC PT "6 Clicks" Mobility  Outcome Measure Help needed turning from your back to your side while in a flat bed without using bedrails?: Total Help needed moving from lying on your back to sitting on the side of a flat bed without using bedrails?: Total Help needed moving to and from a bed to a chair (including a wheelchair)?: Total Help needed standing up from a chair using your arms (e.g., wheelchair or bedside chair)?: Total Help needed to walk in hospital room?: Total Help needed climbing 3-5 steps with a railing? : Total 6 Click Score: 6    End of Session   Activity Tolerance: Patient limited by pain Patient left: in bed;with call bell/phone within reach Nurse Communication: Mobility status PT Visit Diagnosis: Muscle weakness (generalized) (M62.81);Difficulty in walking, not elsewhere classified (R26.2)    Time: 5400-8676 PT Time Calculation (min) (ACUTE ONLY): 12 min   Charges:   PT Evaluation $PT Eval Low Complexity: 1 Low     Kati PT, DPT Acute Rehabilitation Services Pager: [phone] Office: [phone]  York Ram E 06/24/2020, 12:12 PM

## 2020-06-24 NOTE — Plan of Care (Signed)
  Problem: Clinical Measurements: Goal: Will remain free from infection Outcome: Progressing   Problem: Elimination: Goal: Will not experience complications related to bowel motility Outcome: Progressing Goal: Will not experience complications related to urinary retention Outcome: Progressing   Problem: Pain Managment: Goal: General experience of comfort will improve Outcome: Progressing   Problem: Safety: Goal: Ability to remain free from injury will improve Outcome: Progressing   Problem: Skin Integrity: Goal: Risk for impaired skin integrity will decrease Outcome: Progressing

## 2020-06-24 NOTE — Progress Notes (Signed)
ANTICOAGULATION CONSULT NOTE - Follow Up Consult  Pharmacy Consult for Eliquis Indication: hx atrial fibrillation  Allergies  Allergen Reactions  . Codeine Other (See Comments)    Crazy-nightmares  . Amoxicillin Diarrhea        . Buspar [Buspirone] Other (See Comments)    Unknown -- OFF MAR   . Cefdinir Cough  . Doxycycline Diarrhea  . Erythromycin Other (See Comments)    GI upset  . Lisinopril Cough  . Morphine And Related Other (See Comments)    Unknown -- of MAR   . Myrbetriq Andree Elk Er] Other (See Comments)    Off MAR unknown reaction  . Oxybutynin Other (See Comments)    Dry mouth  . Sulfa Antibiotics Other (See Comments)    GI upset  . Toviaz [Fesoterodine Fumarate Er] Other (See Comments)    Unknown off MAR   . Tramadol Hcl Other (See Comments)    Unknown reaction --OFF MAR   . Vesicare [Solifenacin Succinate] Other (See Comments)    Unknown off MAR    Patient Measurements: Height: 5\' 6"  (167.6 cm) Weight: 127 kg (279 lb 15.8 oz) IBW/kg (Calculated) : 59.3 Heparin Dosing Weight:   Vital Signs: Temp: 99.8 F (37.7 C) (11/22 1239) Temp Source: Oral (11/22 1239) BP: 154/62 (11/22 1239) Pulse Rate: 93 (11/22 1239)  Labs: Recent Labs    06/22/20 2333 06/22/20 2333 06/23/20 0442 06/24/20 0405  HGB 7.7*   < > 6.7* 7.2*  HCT 25.7*  --  20.0* 22.8*  PLT 608*  --  386 339  CREATININE 1.39*  --  1.34* 1.33*   < > = values in this interval not displayed.    Estimated Creatinine Clearance: 40.6 mL/min (A) (by C-G formula based on SCr of 1.33 mg/dL (H)).   Medications:  - on Eliquis 2.5 mg bid PTA  Assessment: Patient's an 86 y.o F with hx afib on Eliquis PTA (cards reduced dose to 2.5 mg bid in June 2021 d/t GIB), presented to the ED on 11/20 with c/o feve, cough and AMS. Pharmacy is consulted on 11/22 to resume Eliquis.    Plan:  - resume Eliquis 2.5 mg bid - pharmacy will sign off for Eliquis, but will follow pt peripherally along with  you  Jonae Renshaw P 06/24/2020,1:18 PM

## 2020-06-24 NOTE — Progress Notes (Signed)
Pt has declined use of CPAP QHS.  ?

## 2020-06-24 NOTE — Progress Notes (Signed)
PHARMACY - PHYSICIAN COMMUNICATION CRITICAL VALUE ALERT - BLOOD CULTURE IDENTIFICATION (BCID)  Natasha Ellis is an 86 y.o. female who presented to [facility] on 06/22/2020 with a chief complaint of fever cough, confusion   Name of physician (or Provider) Contacted: Jonny Ruiz  Current antibiotics: Vancomycin and Cefepime  Changes to prescribed antibiotics recommended:  none  Results for orders placed or performed during the hospital encounter of 06/22/20  Blood Culture ID Panel (Reflexed) (Collected: 06/22/2020 11:38 PM)  Result Value Ref Range   Enterococcus faecalis NOT DETECTED NOT DETECTED   Enterococcus Faecium NOT DETECTED NOT DETECTED   Listeria monocytogenes NOT DETECTED NOT DETECTED   Staphylococcus species DETECTED (A) NOT DETECTED   Staphylococcus aureus (BCID) NOT DETECTED NOT DETECTED   Staphylococcus epidermidis DETECTED (A) NOT DETECTED   Staphylococcus lugdunensis NOT DETECTED NOT DETECTED   Streptococcus species NOT DETECTED NOT DETECTED   Streptococcus agalactiae NOT DETECTED NOT DETECTED   Streptococcus pneumoniae NOT DETECTED NOT DETECTED   Streptococcus pyogenes NOT DETECTED NOT DETECTED   A.calcoaceticus-baumannii NOT DETECTED NOT DETECTED   Bacteroides fragilis NOT DETECTED NOT DETECTED   Enterobacterales NOT DETECTED NOT DETECTED   Enterobacter cloacae complex NOT DETECTED NOT DETECTED   Escherichia coli NOT DETECTED NOT DETECTED   Klebsiella aerogenes NOT DETECTED NOT DETECTED   Klebsiella oxytoca NOT DETECTED NOT DETECTED   Klebsiella pneumoniae NOT DETECTED NOT DETECTED   Proteus species NOT DETECTED NOT DETECTED   Salmonella species NOT DETECTED NOT DETECTED   Serratia marcescens NOT DETECTED NOT DETECTED   Haemophilus influenzae NOT DETECTED NOT DETECTED   Neisseria meningitidis NOT DETECTED NOT DETECTED   Pseudomonas aeruginosa NOT DETECTED NOT DETECTED   Stenotrophomonas maltophilia NOT DETECTED NOT DETECTED   Candida albicans NOT DETECTED NOT  DETECTED   Candida auris NOT DETECTED NOT DETECTED   Candida glabrata NOT DETECTED NOT DETECTED   Candida krusei NOT DETECTED NOT DETECTED   Candida parapsilosis NOT DETECTED NOT DETECTED   Candida tropicalis NOT DETECTED NOT DETECTED   Cryptococcus neoformans/gattii NOT DETECTED NOT DETECTED   Methicillin resistance mecA/C DETECTED (A) NOT DETECTED    Angela Adam 06/24/2020  5:27 AM

## 2020-06-24 NOTE — Progress Notes (Signed)
ATtempted to see pt.  Nursing stated pt is in extreme pain screaming if [facility] is moved at all and will not sit up. Hgb was 6.0 and after transfusion is now 7.2. Will hold therapy per nursing. Natasha Ellis,OTR/L [phone]

## 2020-06-24 NOTE — Progress Notes (Addendum)
PROGRESS NOTE    Natasha Ellis  YQM:[id number] DOB: 1930/10/21 DOA: 06/22/2020 PCP: Natasha Orn, MD    Chief Complaint  Patient presents with  . Blood Infection    Brief Narrative:  Natasha Ellis a 86 y.o.femalewith medical history significant fornon-Hodgkin's lymphoma, Waldenstrm's macroglobulinemia, atrial fibrillation recently taken off Eliquis, chronic kidney disease 3B, and recent admission with pneumonia and AKI, now presenting to the emergency department for evaluation of fever, cough, and confusion.  She was admitted for recurrent for multifocal pneumonia. She was also found ot have anemia hemoglobin around 6. 7. She is scheduled to receive 1 unit of prbc transfusion. She was started on broad spectrum IV antibiotics.    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 Findings could reflect multifocal pneumonia versus edema. She was started on broad-spectrum IV antibiotics and oral Lasix 40 mg daily Blood cultures showed coag negative staph probably contamination. Repeat blood cultures ordered. Nasal cannula oxygen to keep sats greater than 90%.   Elevated BNP, ? Edema on CXR On lasix at home, will repeat ECHO.  Monitor urine output.    Pressure injury:  Wound care consulted.   Pressure Injury 06/23/20 Sacrum Mid Stage 2 -  Partial thickness loss of dermis presenting as a shallow open injury with  a red, pink wound bed without slough. (Active)  06/23/20 0725  Location: Sacrum  Location Orientation: Mid  Staging: Stage 2 -  Partial thickness loss of dermis presenting as a shallow open injury with a red, pink wound bed without slough.  Wound Description (Comments):   Present on Admission: Yes     Atrial fibrillation with RVR Started the patient on IV Cardizem 10 mg push followed by oral Cardizem 180 mg daily. Continue with Eliquis for anticoagulation.   Acute encephalopathy probably secondary to multifocal pneumonia, delirious probably from severe pain due to bone mets. Start the patient on IV Dilaudid and continue with fentanyl patch, along with oral oxycodone.     Stage IIIb CKD Creatinine appears to be at baseline at this time.    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 Benay Spice for chemotherapy. She has recently completed 3 cycles of cyclophosphamide pentostatin and rituximab. S/p completion of 5 cycles of radiation therapy during her last hospitalization. X-rays of the hips show lytic lesions scattered throughout the pelvis without any new fractures.  Patient continues to have worsening pain in her pelvis and unable to move, participate.  Pain control with Dilaudid, fentanyl.   Neutropenia and anemia probably secondary to her chemo S/p 1 unit of PRBC transfusion. Repeat hemoglobin around 7.2    COPD Continue with bronchodilators as needed.   DVT prophylaxis: eliquis.  Code Status: DNR  Family Communication: discussed with daughter at bedside.  Disposition:   Status is: Inpatient  Remains inpatient appropriate because:Ongoing diagnostic testing needed not appropriate for outpatient work up, Unsafe d/c plan, IV treatments appropriate due to intensity of  illness or inability to take PO and Inpatient level of care appropriate due to severity of illness   Dispo: The patient  is from: SNF              Anticipated d/c is to: SNF              Anticipated d/c date is: > 3 days              Patient currently is not medically stable to d/c.       Consultants:   Oncology.   Procedures: Bilateral Hip with Pelvis.   Antimicrobials:  Antibiotics Given (last 72 hours)    Date/Time Action Medication Dose Rate   06/23/20 0054 New Bag/Given   aztreonam (AZACTAM) 2 g in sodium chloride 0.9 % 100 mL IVPB 2 g 200 mL/hr   06/23/20 0107 New Bag/Given   metroNIDAZOLE (FLAGYL) IVPB 500 mg 500 mg 100 mL/hr   06/23/20 0129 New Bag/Given   vancomycin (VANCOREADY) IVPB 2000 mg/400 mL 2,000 mg 200 mL/hr   06/23/20 1221 New Bag/Given   ceFEPIme (MAXIPIME) 2 g in sodium chloride 0.9 % 100 mL IVPB 2 g 200 mL/hr   06/23/20 2153 New Bag/Given   ceFEPIme (MAXIPIME) 2 g in sodium chloride 0.9 % 100 mL IVPB 2 g 200 mL/hr   06/24/20 0920 New Bag/Given   ceFEPIme (MAXIPIME) 2 g in sodium chloride 0.9 % 100 mL IVPB 2 g 200 mL/hr   06/24/20 0925 New Bag/Given   vancomycin (VANCOREADY) IVPB 1250 mg/250 mL 1,250 mg 166.7 mL/hr       Subjective:   Objective: Vitals:   06/24/20 0500 06/24/20 0538 06/24/20 1239 06/24/20 1445  BP:  (!) 110/51 (!) 154/62 (!) 166/84  Pulse:  91 93 (!) 155  Resp:  16 18   Temp:  99.4 F (37.4 C) 99.8 F (37.7 C)   TempSrc:  Oral Oral   SpO2:  93% 96%   Weight: 127 kg     Height:        Intake/Output Summary (Last 24 hours) at 06/24/2020 1450 Last data filed at 06/24/2020 1304 Gross per 24 hour  Intake 420 ml  Output 900 ml  Net -480 ml   Filed Weights   06/22/20 2350 06/23/20 0037 06/24/20 0500  Weight: 121 kg 121 kg 127 kg    Examination:  General exam: well developed elderly lady, chronically ill appearing, moderate distress from pain and confusion. Respiratory system: diminished at bases, no wheezing heard, tachypnea present.  Cardiovascular system: S1 & S2 heard, irregular, tachycardic, no JVD.  Gastrointestinal system:  Abdomen is nondistended, soft and nontender.  Normal bowel sounds heard. Central nervous system: Alert but confused, very hard of hearing , delirious.  Extremities: leg edema present.  Skin: stage 2 sacral decubitus  Psychiatry: cannot be assessed.     Data Reviewed: I have personally reviewed following labs and imaging studies  CBC: Recent Labs  Lab 06/19/20 1015 06/22/20 2333 06/23/20 0442 06/24/20 0405  WBC 3.9* 5.6 4.5 2.3*  NEUTROABS 2.2 4.6  --   --   HGB 7.2* 7.7* 6.7* 7.2*  HCT 20.4* 25.7* 20.0* 22.8*  MCV 93.2 92.8 95.2 93.1  PLT 430* 608* 386 811    Basic Metabolic Panel: Recent Labs  Lab 06/19/20 1015 06/22/20 2333 06/22/20 2355 06/23/20 0442 06/24/20 0405  NA 137 135  --  138 140  K 4.7 5.3*  --  4.9 4.6  CL 103 105  --  105  106  CO2 27 20*  --  22 23  GLUCOSE 102* 108*  --  104* 103*  BUN 25* 36*  --  37* 40*  CREATININE 1.24* 1.39*  --  1.34* 1.33*  CALCIUM 8.8* 8.3*  --  8.0* 8.3*  MG  --   --  2.2  --   --     GFR: Estimated Creatinine Clearance: 40.6 mL/min (A) (by C-G formula based on SCr of 1.33 mg/dL (H)).  Liver Function Tests: Recent Labs  Lab 06/19/20 1015 06/22/20 2333 06/23/20 0442 06/24/20 0405  AST 29 68* 52* 41  ALT 15 39 35 32  ALKPHOS 178* 152* 130* 121  BILITOT 1.3* 2.1* 2.0* 2.4*  PROT 7.4 7.4 6.5 6.5  ALBUMIN 2.3* 2.5* 2.2* 2.2*    CBG: No results for input(s): GLUCAP in the last 168 hours.   Recent Results (from the past 240 hour(s))  Culture, blood (Routine x 2)     Status: None (Preliminary result)   Collection Time: 06/22/20 11:33 PM   Specimen: BLOOD  Result Value Ref Range Status   Specimen Description   Final    BLOOD LEFT ANTECUBITAL Performed at [facility] [address], [place], [place] [address]    Special Requests   Final    BOTTLES DRAWN AEROBIC AND ANAEROBIC Blood Culture results may not be optimal due to an inadequate volume of blood received in culture bottles Performed  at [facility] [address], [place], [place] [address]    Culture  Setup Time   Final    GRAM POSITIVE COCCI IN CLUSTERS AEROBIC BOTTLE ONLY CRITICAL RESULT CALLED TO, READ BACK BY AND VERIFIED WITH: Gustavo Lah [id number] 06/24/2020 Mena Goes Performed at [facility], [place] [address], [place], [place] [address]    Culture GRAM POSITIVE COCCI  Final   Report Status PENDING  Incomplete  Culture, blood (Routine x 2)     Status: None (Preliminary result)   Collection Time: 06/22/20 11:38 PM   Specimen: BLOOD LEFT HAND  Result Value Ref Range Status   Specimen Description   Final    BLOOD LEFT HAND Performed at [facility] [address] [place], [place] [address]    Special Requests   Final    BOTTLES DRAWN AEROBIC AND ANAEROBIC Blood Culture adequate volume Performed at [facility] [address], [place], [place] [address]    Culture  Setup Time   Final    GRAM POSITIVE COCCI IN CLUSTERS IN BOTH AEROBIC AND ANAEROBIC BOTTLES CRITICAL RESULT CALLED TO, READ BACK BY AND VERIFIED WITH: Gustavo Lah [id number] 06/24/2020 Mena Goes Performed at [facility], [place] [address], [place],  [address]    Culture GRAM POSITIVE COCCI  Final   Report Status PENDING  Incomplete  Blood Culture ID Panel (Reflexed)     Status: Abnormal   Collection Time: 06/22/20 11:38 PM  Result Value Ref Range Status   Enterococcus faecalis NOT DETECTED NOT DETECTED Final   Enterococcus Faecium NOT DETECTED NOT DETECTED Final   Listeria monocytogenes NOT DETECTED NOT DETECTED Final   Staphylococcus species DETECTED (A) NOT DETECTED Final    Comment: CRITICAL RESULT CALLED TO, READ BACK BY AND VERIFIED WITH: EMariah Milling [id number] 06/24/2020 T. TYSOR    Staphylococcus aureus (BCID) NOT DETECTED NOT DETECTED Final   Staphylococcus epidermidis DETECTED (A) NOT DETECTED Final    Comment: Methicillin (oxacillin) resistant coagulase  negative staphylococcus.  Possible blood culture contaminant (unless isolated from more than one blood culture draw or clinical case suggests pathogenicity). No antibiotic treatment is indicated for blood  culture contaminants. CRITICAL RESULT CALLED TO, READ BACK BY AND VERIFIED WITH: EMariah Milling [id number] 06/24/2020 T. TYSOR    Staphylococcus lugdunensis NOT DETECTED NOT DETECTED Final   Streptococcus species NOT DETECTED NOT DETECTED Final   Streptococcus agalactiae NOT DETECTED NOT DETECTED Final   Streptococcus pneumoniae NOT DETECTED NOT DETECTED Final   Streptococcus pyogenes NOT DETECTED NOT DETECTED Final   A.calcoaceticus-baumannii NOT DETECTED NOT DETECTED Final   Bacteroides fragilis NOT DETECTED NOT DETECTED Final   Enterobacterales NOT DETECTED NOT DETECTED Final   Enterobacter cloacae complex NOT DETECTED NOT DETECTED Final   Escherichia coli NOT DETECTED NOT DETECTED Final   Klebsiella aerogenes NOT DETECTED NOT DETECTED Final   Klebsiella oxytoca NOT DETECTED NOT DETECTED Final   Klebsiella pneumoniae NOT DETECTED NOT DETECTED Final   Proteus species NOT DETECTED NOT DETECTED Final   Salmonella species NOT DETECTED NOT DETECTED Final   Serratia marcescens NOT DETECTED NOT DETECTED Final   Haemophilus influenzae NOT DETECTED NOT DETECTED Final   Neisseria meningitidis NOT DETECTED NOT DETECTED Final   Pseudomonas aeruginosa NOT DETECTED NOT DETECTED Final   Stenotrophomonas maltophilia NOT DETECTED NOT DETECTED Final   Candida albicans NOT DETECTED NOT DETECTED Final   Candida auris NOT DETECTED NOT DETECTED Final   Candida glabrata NOT DETECTED NOT DETECTED Final   Candida krusei NOT DETECTED NOT DETECTED Final   Candida parapsilosis NOT DETECTED NOT DETECTED Final   Candida tropicalis NOT DETECTED NOT DETECTED Final   Cryptococcus neoformans/gattii NOT DETECTED NOT DETECTED Final   Methicillin resistance mecA/C DETECTED (A) NOT DETECTED Final    Comment: CRITICAL  RESULT CALLED TO, READ BACK BY AND VERIFIED WITH: Gustavo Lah [id number] 06/24/2020 Mena Goes Performed at [facility], [address] [place], [place] [address]   Urine culture     Status: None   Collection Time: 06/22/20 11:43 PM   Specimen: Urine, Catheterized  Result Value Ref Range Status   Specimen Description   Final    URINE, CATHETERIZED Performed at [facility] [address], [place], [place] [address]    Special Requests   Final    NONE Performed at [facility], [place] [address], [place], [place] [address]    Culture   Final    NO GROWTH Performed at [facility], [place] [address], [place], [place] [address]    Report Status 06/24/2020 FINAL  Final  Resp Panel by RT-PCR (Flu A&B, Covid) Nasopharyngeal Swab     Status: None   Collection Time: 06/23/20 12:30 AM   Specimen: Nasopharyngeal Swab; Nasopharyngeal(NP) swabs in vial transport medium  Result Value Ref Range Status   SARS Coronavirus 2 by RT PCR NEGATIVE NEGATIVE Final    Comment: (NOTE) SARS-CoV-2 target nucleic acids are NOT DETECTED.  The SARS-CoV-2 RNA is generally detectable in upper respiratory specimens during the acute phase of infection. The lowest concentration of SARS-CoV-2 viral copies this assay can detect is 138 copies/mL. A negative result does not preclude SARS-Cov-2 infection and should not be used as the sole basis for treatment or other patient management decisions. A negative result may occur with  improper specimen collection/handling, submission of specimen other than nasopharyngeal swab, presence of viral mutation(s) within the areas targeted by this assay, and inadequate number of viral copies(<138 copies/mL). A negative result must  be combined with clinical observations, patient history, and epidemiological information. The expected result is Negative.  Fact Sheet for Patients:  EntrepreneurPulse.com.au  Fact  Sheet for Healthcare Providers:  IncredibleEmployment.be  This test is no t yet approved or cleared by the [place] FDA and  has been authorized for detection and/or diagnosis of SARS-CoV-2 by FDA under an Emergency Use Authorization (EUA). This EUA will remain  in effect (meaning this test can be used) for the duration of the COVID-19 declaration under Section 564(b)(1) of the Act, 21 U.S.C.section 360bbb-3(b)(1), unless the authorization is terminated  or revoked sooner.       Influenza A by PCR NEGATIVE NEGATIVE Final   Influenza B by PCR NEGATIVE NEGATIVE Final    Comment: (NOTE) The Xpert Xpress SARS-CoV-2/FLU/RSV plus assay is intended as an aid in the diagnosis of influenza from Nasopharyngeal swab specimens and should not be used as a sole basis for treatment. Nasal washings and aspirates are unacceptable for Xpert Xpress SARS-CoV-2/FLU/RSV testing.  Fact Sheet for Patients: EntrepreneurPulse.com.au  Fact Sheet for Healthcare Providers: IncredibleEmployment.be  This test is not yet approved or cleared by the [place] FDA and has been authorized for detection and/or diagnosis of SARS-CoV-2 by FDA under an Emergency Use Authorization (EUA). This EUA will remain in effect (meaning this test can be used) for the duration of the COVID-19 declaration under Section 564(b)(1) of the Act, 21 U.S.C. section 360bbb-3(b)(1), unless the authorization is terminated or revoked.  Performed at [facility], [place] [address] [place], [place] [address]          Radiology Studies: CT Head Wo Contrast  Result Date: 06/23/2020 CLINICAL DATA:  Altered mental status. EXAM: CT HEAD WITHOUT CONTRAST TECHNIQUE: Contiguous axial images were obtained from the base of the skull through the vertex without intravenous contrast. COMPARISON:  January 14, 2016 FINDINGS: Brain: There is moderate severity cerebral  atrophy with widening of the extra-axial spaces and ventricular dilatation. There are areas of decreased attenuation within the white matter tracts of the supratentorial brain, consistent with microvascular disease changes. Vascular: No hyperdense vessel or unexpected calcification. Skull: Negative for an acute fracture. Multiple lytic lesions are seen scattered throughout the skull. The largest is seen within the right parietal region and measures approximately 2.4 cm x 0.6 cm. This represents a new finding when compared to the prior exam. Sinuses/Orbits: There is marked severity sphenoid sinus, bilateral ethmoid sinus and bilateral maxillary sinus mucosal thickening. Other: None. IMPRESSION: 1. Multiple lytic lesions scattered throughout the skull, concerning for multiple myeloma or metastatic disease. 2. Marked severity pansinus disease. 3. No acute intracranial abnormality. Electronically Signed   By: Virgina Norfolk M.D.   On: 06/23/2020 02:33   DG Chest Port 1 View  Result Date: 06/22/2020 CLINICAL DATA:  Suspected sepsis, shortness of breath, history of smoking, asthma EXAM: PORTABLE CHEST 1 VIEW COMPARISON:  Radiograph 06/11/2020, CT 03/18/2020 FINDINGS: Increasing patchy opacities most coalescent in the left lung base/retrocardiac space and partially silhouetting the left hemidiaphragm with more diffuse hazy interstitial opacity elsewhere throughout both lungs. No pneumothorax or visible effusion. The aorta is calcified. The remaining cardiomediastinal contours are unremarkable. Redemonstrated expansile lesions in the left third and fourth ribs, similar to comparison CT. No new lytic or blastic lesions are seen. The osseous structures appear diffusely demineralized which may limit detection of small or nondisplaced fractures. Degenerative changes are present in the imaged spine and shoulders. IMPRESSION: Increasing patchy opacities most coalescent in the left lung  base/retrocardiac space and  partially silhouetting the left hemidiaphragm with more diffuse hazy interstitial opacity elsewhere throughout both lungs. Findings could reflect multifocal pneumonia versus edema. Expansile lytic lesions in the left third and fourth ribs, similar to comparison CT. Electronically Signed   By: Lovena Le M.D.   On: 06/22/2020 23:52   DG HIPS BILAT WITH PELVIS 3-4 VIEWS  Result Date: 06/24/2020 CLINICAL DATA:  Hip pain, no known injury, initial encounter EXAM: DG HIP (WITH OR WITHOUT PELVIS) 3-4V BILAT COMPARISON:  06/02/2020 FINDINGS: Pelvic ring appears intact. There are lytic lesions scattered throughout the pelvis which were better visualized on the most recent CT examination. No definitive pathologic fracture is noted at this time. No femoral fracture is seen. No soft tissue abnormality is noted. IMPRESSION: Lytic lesions within the pelvis similar to that seen on prior CT examination. No definitive fracture noted at this time Electronically Signed   By: Inez Catalina M.D.   On: 06/24/2020 14:47        Scheduled Meds: . sodium chloride   Intravenous Once  . allopurinol  100 mg Oral Daily  . apixaban  2.5 mg Oral BID  . aspirin  81 mg Oral Daily  . diltiazem  180 mg Oral Daily  . famotidine  20 mg Oral Daily  . fentaNYL  1 patch Transdermal Q72H  . furosemide  40 mg Oral Daily  . mouth rinse  15 mL Mouth Rinse BID  . sodium chloride flush  10-40 mL Intracatheter Q12H  . sodium chloride flush  3 mL Intravenous Q12H   Continuous Infusions: . ceFEPime (MAXIPIME) IV 2 g (06/24/20 0920)  . vancomycin 1,250 mg (06/24/20 0925)     LOS: 1 day        Hosie Poisson, MD [facility]   To contact the attending provider between 7A-7P or the covering provider during after hours 7P-7A, please log into the web site [email or web address] and access using universal [place] password for that web site. If you do not have the password, please call the hospital operator.  06/24/2020, 2:50 PM

## 2020-06-24 NOTE — Progress Notes (Signed)
One unit PRBC given as ordered. Tolerated well.

## 2020-06-25 DIAGNOSIS — C7951 Secondary malignant neoplasm of bone: Secondary | ICD-10-CM | POA: Diagnosis not present

## 2020-06-25 DIAGNOSIS — J189 Pneumonia, unspecified organism: Secondary | ICD-10-CM | POA: Diagnosis not present

## 2020-06-25 DIAGNOSIS — A419 Sepsis, unspecified organism: Secondary | ICD-10-CM | POA: Diagnosis not present

## 2020-06-25 DIAGNOSIS — I4891 Unspecified atrial fibrillation: Secondary | ICD-10-CM | POA: Diagnosis not present

## 2020-06-25 DIAGNOSIS — G9340 Encephalopathy, unspecified: Secondary | ICD-10-CM | POA: Diagnosis not present

## 2020-06-25 LAB — DIFFERENTIAL
Abs Immature Granulocytes: 0.07 10*3/uL (ref 0.00–0.07)
Basophils Absolute: 0 10*3/uL (ref 0.0–0.1)
Basophils Relative: 1 %
Eosinophils Absolute: 0.1 10*3/uL (ref 0.0–0.5)
Eosinophils Relative: 5 %
Immature Granulocytes: 4 %
Lymphocytes Relative: 27 %
Lymphs Abs: 0.5 10*3/uL — ABNORMAL LOW (ref 0.7–4.0)
Monocytes Absolute: 0.1 10*3/uL (ref 0.1–1.0)
Monocytes Relative: 7 %
Neutro Abs: 1 10*3/uL — ABNORMAL LOW (ref 1.7–7.7)
Neutrophils Relative %: 56 %

## 2020-06-25 LAB — CBC
HCT: 23.1 % — ABNORMAL LOW (ref 36.0–46.0)
Hemoglobin: 7.2 g/dL — ABNORMAL LOW (ref 12.0–15.0)
MCH: 29.1 pg (ref 26.0–34.0)
MCHC: 31.2 g/dL (ref 30.0–36.0)
MCV: 93.5 fL (ref 80.0–100.0)
Platelets: 339 10*3/uL (ref 150–400)
RBC: 2.47 MIL/uL — ABNORMAL LOW (ref 3.87–5.11)
RDW: 22.3 % — ABNORMAL HIGH (ref 11.5–15.5)
WBC: 2.9 10*3/uL — ABNORMAL LOW (ref 4.0–10.5)
nRBC: 0 % (ref 0.0–0.2)

## 2020-06-25 LAB — COMPREHENSIVE METABOLIC PANEL
ALT: 25 U/L (ref 0–44)
AST: 30 U/L (ref 15–41)
Albumin: 2 g/dL — ABNORMAL LOW (ref 3.5–5.0)
Alkaline Phosphatase: 117 U/L (ref 38–126)
Anion gap: 8 (ref 5–15)
BUN: 39 mg/dL — ABNORMAL HIGH (ref 8–23)
CO2: 23 mmol/L (ref 22–32)
Calcium: 8.1 mg/dL — ABNORMAL LOW (ref 8.9–10.3)
Chloride: 109 mmol/L (ref 98–111)
Creatinine, Ser: 1.36 mg/dL — ABNORMAL HIGH (ref 0.44–1.00)
GFR, Estimated: 38 mL/min — ABNORMAL LOW (ref >60)
Glucose, Bld: 96 mg/dL (ref 70–99)
Potassium: 4.4 mmol/L (ref 3.5–5.1)
Sodium: 140 mmol/L (ref 135–145)
Total Bilirubin: 1.8 mg/dL — ABNORMAL HIGH (ref 0.3–1.2)
Total Protein: 6.4 g/dL — ABNORMAL LOW (ref 6.5–8.1)

## 2020-06-25 LAB — CULTURE, BLOOD (ROUTINE X 2)

## 2020-06-25 MED ORDER — OXYCODONE-ACETAMINOPHEN 5-325 MG PO TABS
1.0000 | ORAL_TABLET | ORAL | Status: DC | PRN
  Administered 2020-06-25 – 2020-06-26 (×3): 1 via ORAL
  Filled 2020-06-25 (×3): qty 1

## 2020-06-25 MED ORDER — DILTIAZEM HCL 60 MG PO TABS
60.0000 mg | ORAL_TABLET | Freq: Four times a day (QID) | ORAL | Status: DC
  Administered 2020-06-25 – 2020-06-26 (×5): 60 mg via ORAL
  Filled 2020-06-25 (×6): qty 1

## 2020-06-25 MED ORDER — POTASSIUM CHLORIDE 10 MEQ/100ML IV SOLN
INTRAVENOUS | Status: AC
  Filled 2020-06-25: qty 200

## 2020-06-25 MED ORDER — HYDROMORPHONE HCL 1 MG/ML IJ SOLN
1.0000 mg | INTRAMUSCULAR | Status: DC | PRN
  Administered 2020-06-25 – 2020-06-26 (×2): 1 mg via INTRAVENOUS
  Filled 2020-06-25 (×2): qty 1

## 2020-06-25 NOTE — Progress Notes (Signed)
PROGRESS NOTE    Natasha Ellis  JOI:[id number] DOB: 01-16-1931 DOA: 06/22/2020 PCP: Lavone Orn, MD    Chief Complaint  Patient presents with  . Blood Infection    Brief Narrative:  Natasha Ellis a 86 y.o.femalewith medical history significant fornon-Hodgkin's lymphoma, Waldenstrm's macroglobulinemia, atrial fibrillation on Eliquis, chronic kidney disease 3B, and recent admission with pneumonia and AKI, now presenting to the emergency department for evaluation of fever, cough, and confusion.  She was admitted for recurrent for multifocal pneumonia, she was started on broad-spectrum IV antibiotics.  Blood cultures grew coag negative staph, suspicious for contamination.  She was also found to have anemia, with a  hemoglobin around 6.7, underwent 1 unit of PRBC transfusion. Patient continues to be confused, but mental status better than yesterday.   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 Findings could reflect multifocal pneumonia versus edema. She was started on broad-spectrum IV antibiotics and oral Lasix 40 mg daily Blood cultures showed coag negative staph probably contamination. Repeat blood cultures ordered. Nasal cannula oxygen to keep sats greater than 90%.  Patient currently on 2 L of nasal cannula oxygen to keep sats greater than 90%.   Elevated BNP, ? Edema on CXR On lasix at home, repeat  echocardiogram is pending at this time.  Monitor urine output.    Pressure injury:  Wound care consulted.   Pressure Injury 06/23/20 Sacrum Mid Deep Tissue Pressure Injury - Purple or maroon localized area of discolored intact skin or blood-filled blister due to damage of underlying soft tissue from pressure and/or shear. (Active)  06/23/20 0725  Location: Sacrum  Location Orientation: Mid  Staging: Deep Tissue Pressure Injury - Purple or maroon localized area of discolored intact skin or blood-filled blister due to damage of underlying soft tissue from pressure and/or shear.  Wound Description (Comments):   Present on Admission: Yes     Atrial fibrillation with RVR Rate better controlled with Cardizem gtt., transition to oral Cardizem 60 mg every 6 hours. Continue with Eliquis for anticoagulation.   Acute encephalopathy probably secondary to multifocal pneumonia, delirious probably from severe pain due to bone mets. Improving but not back to baseline yet patient is alert opening her eyes, not delirious as yesterday. Cutdown on IV Dilaudid.  Continue with fentanyl and oral oxycodone for pain from bone mets     Stage IIIb CKD Creatinine appears to be at baseline at this time.    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 Benay Spice for chemotherapy. She has recently completed 3 cycles of cyclophosphamide pentostatin and rituximab. S/p completion of 5 cycles of radiation therapy during her last hospitalization. X-rays of the hips show lytic lesions scattered throughout the pelvis without any new fractures.  Recommend PT evaluation.  Pain control with fentanyl patch and oral oxycodone.   Neutropenia and anemia probably secondary to her chemo S/p 1 unit of PRBC transfusion. Repeat hemoglobin around 7  COPD Continue with bronchodilators as needed.   DVT prophylaxis: eliquis.  Code Status:  DNR  Family Communication: None at bedside today Disposition:   Status is: Inpatient  Remains inpatient appropriate because:Ongoing diagnostic testing needed not appropriate for outpatient work up, Unsafe d/c plan, IV treatments appropriate due to intensity of illness or inability to take PO and Inpatient level of care appropriate due to severity of illness   Dispo: The patient is from: SNF              Anticipated d/c is to: SNF              Anticipated d/c date is: 2 days              Patient currently is not medically stable to d/c.       Consultants:   Oncology.   Procedures: Bilateral Hip with Pelvis.   Antimicrobials:  Antibiotics Given (last 72 hours)    Date/Time Action Medication Dose Rate   06/23/20 0054 New Bag/Given   aztreonam (AZACTAM) 2 g in sodium chloride 0.9 % 100 mL IVPB 2 g 200 mL/hr   06/23/20 0107 New Bag/Given   metroNIDAZOLE (FLAGYL) IVPB 500 mg 500 mg 100 mL/hr   06/23/20 0129 New Bag/Given   vancomycin (VANCOREADY) IVPB 2000 mg/400 mL 2,000 mg 200 mL/hr   06/23/20 1221 New Bag/Given   ceFEPIme (MAXIPIME) 2 g in sodium chloride 0.9 % 100 mL IVPB 2 g 200 mL/hr   06/23/20 2153 New Bag/Given   ceFEPIme (MAXIPIME) 2 g in sodium chloride 0.9 % 100 mL IVPB 2 g 200 mL/hr   06/24/20 0920 New Bag/Given   ceFEPIme (MAXIPIME) 2 g in sodium chloride 0.9 % 100 mL IVPB 2 g 200 mL/hr   06/24/20 0925 New Bag/Given   vancomycin (VANCOREADY) IVPB 1250 mg/250 mL 1,250 mg 166.7 mL/hr   06/24/20 2105 New Bag/Given   ceFEPIme (MAXIPIME) 2 g in sodium chloride 0.9 % 100 mL IVPB 2 g 200 mL/hr   06/25/20 0109 New Bag/Given   ceFEPIme (MAXIPIME) 2 g in sodium chloride 0.9 % 100 mL IVPB 2 g 200 mL/hr   06/25/20 0844 New Bag/Given   vancomycin (VANCOREADY) IVPB 1250 mg/250 mL 1,250 mg 166.7 mL/hr       Subjective: Patient is alert open her eyes on verbal cues does not appear to be delirious or agitated as yesterday .  Objective: Vitals:   06/25/20 0845 06/25/20  0936 06/25/20 1006 06/25/20 1106  BP: 108/71 (!) 131/52 126/61 (!) 124/59  Pulse:  74 65 75  Resp:   12   Temp:   98.1 F (36.7 C)   TempSrc:   Oral   SpO2:  97% 98%   Weight:      Height:        Intake/Output Summary (Last 24 hours) at 06/25/2020 1206 Last data filed at 06/25/2020 0600 Gross per 24 hour  Intake 654.06 ml  Output 1000 ml  Net -345.94 ml   Filed Weights   06/23/20 0037 06/24/20 0500 06/25/20 0458  Weight: 121 kg 127 kg 125.3 kg    Examination:  General exam: Elderly female not in any kind of distress Respiratory system: Diminished air entry at bases, no wheezing or rhonchi Cardiovascular system: S1-S2 heard, irregular, no JVD pedal edema present Gastrointestinal system: Abdomen is soft, nontender bowel sounds normal Central nervous system: Alert opening eyes to verbal cues, Extremities: Leg edema present Skin: Stage II deep tissue injury to the sacrum Psychiatry:  cannot be assessed.     Data Reviewed: I have personally reviewed following labs and imaging studies  CBC: Recent Labs  Lab 06/19/20 1015 06/22/20 2333 06/23/20 0442 06/24/20 0405 06/25/20 0400  WBC 3.9* 5.6 4.5 2.3* 2.9*  NEUTROABS 2.2 4.6  --   --   --   HGB 7.2* 7.7* 6.7* 7.2* 7.2*  HCT 20.4* 25.7* 20.0* 22.8* 23.1*  MCV 93.2 92.8 95.2 93.1 93.5  PLT 430* 608* 386 339 401    Basic Metabolic Panel: Recent Labs  Lab 06/19/20 1015 06/22/20 2333 06/22/20 2355 06/23/20 0442 06/24/20 0405 06/25/20 0400  NA 137 135  --  138 140 140  K 4.7 5.3*  --  4.9 4.6 4.4  CL 103 105  --  105 106 109  CO2 27 20*  --  22 23 23   GLUCOSE 102* 108*  --  104* 103* 96  BUN 25* 36*  --  37* 40* 39*  CREATININE 1.24* 1.39*  --  1.34* 1.33* 1.36*  CALCIUM 8.8* 8.3*  --  8.0* 8.3* 8.1*  MG  --   --  2.2  --   --   --     GFR: Estimated Creatinine Clearance: 39.4 mL/min (A) (by C-G formula based on SCr of 1.36 mg/dL (H)).  Liver Function Tests: Recent Labs  Lab 06/19/20 1015  06/22/20 2333 06/23/20 0442 06/24/20 0405 06/25/20 0400  AST 29 68* 52* 41 30  ALT 15 39 35 32 25  ALKPHOS 178* 152* 130* 121 117  BILITOT 1.3* 2.1* 2.0* 2.4* 1.8*  PROT 7.4 7.4 6.5 6.5 6.4*  ALBUMIN 2.3* 2.5* 2.2* 2.2* 2.0*    CBG: No results for input(s): GLUCAP in the last 168 hours.   Recent Results (from the past 240 hour(s))  Culture, blood (Routine x 2)     Status: Abnormal   Collection Time: 06/22/20 11:33 PM   Specimen: BLOOD  Result Value Ref Range Status   Specimen Description   Final    BLOOD LEFT ANTECUBITAL Performed at [facility] [address] [place], [place] [address]    Special Requests   Final    BOTTLES DRAWN AEROBIC AND ANAEROBIC Blood Culture results may not be optimal due to an inadequate volume of blood received in culture bottles Performed at [facility] [address], [place], [place] [address]    Culture  Setup Time   Final    GRAM POSITIVE COCCI IN CLUSTERS AEROBIC BOTTLE ONLY CRITICAL RESULT CALLED TO, READ BACK BY AND VERIFIED WITH: E. Mariah Milling [phone] 06/24/2020 T. TYSOR    Culture (A)  Final    STAPHYLOCOCCUS CAPITIS THE SIGNIFICANCE OF ISOLATING THIS ORGANISM FROM A SINGLE SET OF BLOOD CULTURES WHEN MULTIPLE SETS ARE DRAWN IS UNCERTAIN. PLEASE NOTIFY THE MICROBIOLOGY DEPARTMENT WITHIN ONE WEEK IF SPECIATION AND SENSITIVITIES ARE REQUIRED. Performed at [facility], [place] [address], [place], [place] [address]    Report Status 06/25/2020 FINAL  Final  Culture, blood (Routine x 2)     Status: Abnormal (Preliminary result)   Collection Time: 06/22/20 11:38 PM   Specimen: BLOOD LEFT HAND  Result Value Ref Range Status   Specimen Description   Final    BLOOD LEFT HAND Performed at [facility] [address], [place], [place] [address]    Special Requests   Final    BOTTLES DRAWN AEROBIC AND ANAEROBIC Blood Culture adequate volume Performed at [facility], [address] [place], [place] [address]    Culture  Setup Time   Final    GRAM POSITIVE COCCI IN CLUSTERS IN BOTH AEROBIC AND ANAEROBIC BOTTLES CRITICAL RESULT CALLED TO, READ BACK BY AND VERIFIED WITH: E. Mariah Milling [id number] 06/24/2020 T. TYSOR    Culture (A)  Final    STAPHYLOCOCCUS EPIDERMIDIS THE SIGNIFICANCE OF ISOLATING THIS ORGANISM FROM A SINGLE SET OF BLOOD CULTURES WHEN MULTIPLE SETS ARE DRAWN IS UNCERTAIN. PLEASE NOTIFY THE MICROBIOLOGY DEPARTMENT WITHIN ONE WEEK IF SPECIATION AND SENSITIVITIES ARE REQUIRED. Performed at [facility], [place] [address] [place], [place] [address]    Report Status PENDING  Incomplete  Blood Culture ID Panel (Reflexed)     Status: Abnormal   Collection Time: 06/22/20 11:38 PM  Result Value Ref Range Status   Enterococcus faecalis NOT DETECTED NOT DETECTED Final   Enterococcus Faecium NOT DETECTED NOT DETECTED Final   Listeria monocytogenes NOT DETECTED NOT DETECTED Final   Staphylococcus species DETECTED (A) NOT DETECTED Final    Comment: CRITICAL RESULT CALLED TO, READ BACK BY AND VERIFIED WITH: EMariah Milling 1027 06/24/2020 T. TYSOR    Staphylococcus aureus (BCID) NOT DETECTED NOT DETECTED Final   Staphylococcus epidermidis DETECTED (A) NOT DETECTED Final    Comment: Methicillin (oxacillin) resistant coagulase negative staphylococcus. Possible blood culture contaminant (unless isolated from more than one blood culture draw or clinical case suggests pathogenicity). No antibiotic treatment is indicated for blood  culture contaminants. CRITICAL RESULT CALLED TO, READ BACK BY AND VERIFIED WITH: EMariah Milling [phone] 06/24/2020 T. TYSOR    Staphylococcus lugdunensis NOT DETECTED NOT DETECTED Final   Streptococcus species NOT DETECTED NOT DETECTED Final   Streptococcus agalactiae NOT DETECTED NOT DETECTED Final   Streptococcus pneumoniae NOT DETECTED NOT DETECTED Final   Streptococcus pyogenes NOT DETECTED NOT DETECTED  Final   A.calcoaceticus-baumannii NOT DETECTED NOT DETECTED Final   Bacteroides fragilis NOT DETECTED NOT DETECTED Final   Enterobacterales NOT DETECTED NOT DETECTED Final   Enterobacter cloacae complex NOT DETECTED NOT DETECTED Final   Escherichia coli NOT DETECTED NOT DETECTED Final   Klebsiella aerogenes NOT DETECTED NOT DETECTED Final   Klebsiella oxytoca NOT DETECTED NOT DETECTED Final   Klebsiella pneumoniae NOT DETECTED NOT DETECTED Final   Proteus species NOT DETECTED NOT DETECTED Final   Salmonella species NOT DETECTED NOT DETECTED Final   Serratia marcescens NOT DETECTED NOT DETECTED Final   Haemophilus influenzae NOT DETECTED NOT DETECTED Final   Neisseria meningitidis NOT DETECTED NOT DETECTED Final   Pseudomonas aeruginosa NOT DETECTED NOT DETECTED Final   Stenotrophomonas maltophilia NOT DETECTED NOT DETECTED Final   Candida albicans NOT DETECTED NOT DETECTED Final   Candida auris NOT DETECTED NOT DETECTED Final   Candida glabrata NOT DETECTED NOT DETECTED Final   Candida krusei NOT DETECTED NOT DETECTED Final   Candida parapsilosis NOT DETECTED NOT DETECTED Final   Candida tropicalis NOT DETECTED NOT DETECTED Final   Cryptococcus neoformans/gattii NOT DETECTED NOT DETECTED Final   Methicillin resistance mecA/C DETECTED (A) NOT DETECTED Final    Comment: CRITICAL RESULT CALLED TO, READ BACK BY AND VERIFIED WITH: Gustavo Lah [id number] 06/24/2020 Mena Goes Performed at [facility], [address] [place], [place] [address]   Urine culture     Status: None   Collection Time: 06/22/20 11:43 PM   Specimen: Urine, Catheterized  Result Value Ref Range Status   Specimen Description   Final    URINE, CATHETERIZED Performed at [facility], [place] [address] [place], [place] [address]    Special Requests   Final    NONE Performed at [facility], [place] [address] [place], [place] [address]    Culture   Final    NO  GROWTH Performed at [facility], [place] [address], [place], [place] [address]    Report Status 06/24/2020 FINAL  Final  Resp Panel by RT-PCR (Flu A&B, Covid) Nasopharyngeal Swab     Status: None   Collection Time: 06/23/20 12:30 AM   Specimen: Nasopharyngeal Swab; Nasopharyngeal(NP) swabs in vial transport medium  Result Value Ref Range Status   SARS Coronavirus 2 by RT PCR NEGATIVE NEGATIVE Final    Comment: (NOTE) SARS-CoV-2 target nucleic acids are NOT DETECTED.  The SARS-CoV-2 RNA is generally detectable in upper respiratory specimens during the acute phase of infection. The lowest concentration of SARS-CoV-2 viral copies this assay can detect is 138 copies/mL. A negative result does not preclude SARS-Cov-2 infection and should not be used as the sole basis for treatment or other patient management decisions. A negative result may occur with  improper specimen collection/handling, submission of specimen other than nasopharyngeal swab, presence of viral mutation(s) within the areas targeted by this assay, and inadequate number of viral copies(<138 copies/mL). A negative result must be combined with clinical observations, patient history, and epidemiological information. The expected result is Negative.  Fact Sheet for Patients:  EntrepreneurPulse.com.au  Fact Sheet for Healthcare Providers:  IncredibleEmployment.be  This test is no t yet approved or cleared by the [place] FDA and  has been authorized for detection and/or diagnosis of SARS-CoV-2 by FDA under an Emergency Use Authorization (EUA). This EUA will remain  in effect (meaning this test can be used) for the duration of the COVID-19 declaration under Section 564(b)(1) of the Act, 21 U.S.C.section 360bbb-3(b)(1), unless the authorization is terminated  or revoked sooner.       Influenza A by PCR NEGATIVE NEGATIVE Final   Influenza B by PCR NEGATIVE NEGATIVE Final     Comment: (NOTE) The Xpert Xpress SARS-CoV-2/FLU/RSV plus assay is intended as an aid in the diagnosis of influenza from Nasopharyngeal swab specimens and should not be used as a sole basis for treatment. Nasal washings and aspirates are unacceptable for Xpert Xpress SARS-CoV-2/FLU/RSV testing.  Fact Sheet for Patients: EntrepreneurPulse.com.au  Fact Sheet for Healthcare Providers: IncredibleEmployment.be  This test is not yet approved or cleared by the [place] FDA and has been authorized for detection and/or diagnosis of SARS-CoV-2 by FDA under an Emergency Use Authorization (EUA). This EUA will remain in effect (meaning this test can be used) for the duration of the COVID-19 declaration under Section 564(b)(1) of the Act, 21 U.S.C. section 360bbb-3(b)(1), unless the authorization is terminated or revoked.  Performed at [facility], [place] [address], [place], [place] [address]          Radiology Studies: DG HIPS BILAT WITH PELVIS 3-4 VIEWS  Result Date: 06/24/2020 CLINICAL DATA:  Hip pain, no known injury, initial encounter EXAM: DG HIP (WITH OR WITHOUT PELVIS) 3-4V BILAT COMPARISON:  06/02/2020 FINDINGS: Pelvic ring appears intact. There are lytic lesions scattered throughout the pelvis which were better visualized on the most recent CT examination. No definitive pathologic fracture is noted at this time. No femoral fracture is seen. No soft tissue abnormality is noted. IMPRESSION: Lytic lesions within the pelvis similar to that seen on prior CT examination. No definitive fracture noted at this time  Electronically Signed   By: Inez Catalina M.D.   On: 06/24/2020 14:47        Scheduled Meds: . sodium chloride   Intravenous Once  . allopurinol  100 mg Oral Daily  . apixaban  2.5 mg Oral BID  . aspirin  81 mg Oral Daily  . diltiazem  60 mg Oral Q6H  . famotidine  20 mg Oral Daily  . fentaNYL  1 patch Transdermal  Q72H  . furosemide  40 mg Oral Daily  . gabapentin  100 mg Oral TID  . mouth rinse  15 mL Mouth Rinse BID  . pantoprazole  40 mg Oral Daily  . sodium chloride flush  10-40 mL Intracatheter Q12H  . sodium chloride flush  3 mL Intravenous Q12H   Continuous Infusions: . ceFEPime (MAXIPIME) IV 2 g (06/25/20 0835)  . vancomycin 1,250 mg (06/25/20 0844)     LOS: 2 days        Hosie Poisson, MD [facility]   To contact the attending provider between 7A-7P or the covering provider during after hours 7P-7A, please log into the web site [email or web address] and access using universal [facility] password for that web site. If you do not have the password, please call the hospital operator.  06/25/2020, 12:06 PM

## 2020-06-25 NOTE — Progress Notes (Signed)
Pt refuses CPAP QHS. 

## 2020-06-25 NOTE — Consult Note (Addendum)
WOC Nurse Consult Note: Reason for Consult: Consult requested for sacrum Wound type: Dark reddish purple deep tissue pressure injury; .3X.3cm at the sacrum/gluteal cleft Pressure Injury POA: Yes Dressing procedure/placement/frequency: Topical treatment orders provided for bedside nurses to perform as follows to protect from further injury: Foam dressing to sacrum, change Q 3 days or PRN soiling.  Please re-consult if further assistance is needed.  Thank-you,  Julien Girt MSN, [place], [place], [place], [place]

## 2020-06-25 NOTE — Care Management Important Message (Signed)
Important Message  Patient Details IM Letter given to the Patient. Name: Natasha Ellis MRN: [id number] Date of Birth: Jul 26, 1931   Medicare Important Message Given:  Yes     Kerin Salen 06/25/2020, 11:01 AM

## 2020-06-25 NOTE — Progress Notes (Addendum)
HEMATOLOGY-ONCOLOGY PROGRESS NOTE  SUBJECTIVE: Natasha Ellis was sent to the emergency room from a skilled nursing facility due to fever, cough, confusion.  She was febrile in the emergency room and also tachypneic and tachycardic.  Blood pressure was low.  Chest x-ray shows findings concerning for multifocal pneumonia versus edema.  She was pancultured and started on antibiotics.  The patient has received 1 unit PRBCs this admission.  The patient is resting quietly at the time my visit.  Per nursing, she yells out in pain with minimal movement.  Continues to have intermittent fevers.  Had episode of A. fib with RVR yesterday and was started on a Cardizem drip and has now been transitioned to oral Cardizem.  Remains on Eliquis for anticoagulation.  Oncology History  Waldenstrom's macroglobulinemia ([place])  04/19/2014 Initial Diagnosis   Waldenstrom's macroglobulinemia ([place])   11/27/2019 - 12/27/2019 Chemotherapy   The patient had palonosetron (ALOXI) injection 0.25 mg, 0.25 mg, Intravenous,  Once, 2 of 4 cycles Administration: 0.25 mg (11/27/2019), 0.25 mg (12/26/2019) bendamustine (BENDEKA) 175 mg in sodium chloride 0.9 % 50 mL (3.0702 mg/mL) chemo infusion, 70 mg/m2 = 175 mg (100 % of original dose 70 mg/m2), Intravenous,  Once, 2 of 4 cycles Dose modification: 70 mg/m2 (original dose 70 mg/m2, Cycle 1, Reason: Provider Judgment) Administration: 175 mg (11/27/2019), 175 mg (11/28/2019), 175 mg (12/26/2019), 175 mg (12/27/2019) riTUXimab-pvvr (RUXIENCE) 900 mg in sodium chloride 0.9 % 250 mL (2.6471 mg/mL) infusion, 375 mg/m2 = 900 mg, Intravenous,  Once, 2 of 4 cycles Administration: 900 mg (11/27/2019), 900 mg (12/26/2019)  for chemotherapy treatment.    05/09/2020 -  Chemotherapy   The patient had palonosetron (ALOXI) injection 0.25 mg, 0.25 mg, Intravenous,  Once, 3 of 4 cycles Administration: 0.25 mg (05/09/2020), 0.25 mg (05/31/2020), 0.25 mg (06/19/2020) pentostatin (NIPENT) 4.8 mg in sodium chloride  0.9 % 50 mL chemo infusion, 2 mg/m2 = 4.8 mg (100 % of original dose 2 mg/m2), Intravenous,  Once, 3 of 4 cycles Dose modification: 2 mg/m2 (original dose 2 mg/m2, Cycle 1, Reason: Provider Judgment) Administration: 4.8 mg (05/09/2020), 4.8 mg (05/31/2020), 4.6 mg (06/19/2020) cyclophosphamide (CYTOXAN) 920 mg in sodium chloride 0.9 % 250 mL chemo infusion, 400 mg/m2 = 920 mg (100 % of original dose 400 mg/m2), Intravenous,  Once, 3 of 4 cycles Dose modification: 400 mg/m2 (original dose 400 mg/m2, Cycle 1, Reason: Provider Judgment) Administration: 920 mg (05/09/2020), 920 mg (05/31/2020), 920 mg (06/19/2020) riTUXimab-pvvr (RUXIENCE) 900 mg in sodium chloride 0.9 % 250 mL (2.6471 mg/mL) infusion, 375 mg/m2 = 900 mg, Intravenous,  Once, 3 of 4 cycles Administration: 900 mg (05/09/2020), 900 mg (05/31/2020), 900 mg (06/19/2020)  for chemotherapy treatment.     PHYSICAL EXAMINATION:  Vitals:   06/25/20 0623 06/25/20 0729  BP: (!) 142/86 105/70  Pulse: 75 69  Resp: 16   Temp: 97.8 F (36.6 C)   SpO2: 96% 98%   Filed Weights   06/23/20 0037 06/24/20 0500 06/25/20 0458  Weight: 121 kg 127 kg 125.3 kg    Intake/Output from previous day: 11/22 0701 - 11/23 0700 In: 654.1 [P.O.:120; I.V.:73.1; IV Piggyback:460.9] Out: 1000 [Urine:1000]  GENERAL: Elderly female, no distress HEENT: The tongue is dry LYMPH: Palpable 4 cm right inguinal lymph node, which is overall decreasing in size LUNGS: Diminished breath sounds at the bases.   HEART: Irregular, lower extremity edema present ABDOMEN:abdomen soft, non-tender and normal bowel sounds NEURO: Alert and oriented  LABORATORY DATA:  I have reviewed the data as  listed CMP Latest Ref Rng & Units 06/25/2020 06/24/2020 06/23/2020  Glucose 70 - 99 mg/dL 96 103(H) 104(H)  BUN 8 - 23 mg/dL 39(H) 40(H) 37(H)  Creatinine 0.44 - 1.00 mg/dL 1.36(H) 1.33(H) 1.34(H)  Sodium 135 - 145 mmol/L 140 140 138  Potassium 3.5 - 5.1 mmol/L 4.4 4.6 4.9   Chloride 98 - 111 mmol/L 109 106 105  CO2 22 - 32 mmol/L '23 23 22  ' Calcium 8.9 - 10.3 mg/dL 8.1(L) 8.3(L) 8.0(L)  Total Protein 6.5 - 8.1 g/dL 6.4(L) 6.5 6.5  Total Bilirubin 0.3 - 1.2 mg/dL 1.8(H) 2.4(H) 2.0(H)  Alkaline Phos 38 - 126 U/L 117 121 130(H)  AST 15 - 41 U/L 30 41 52(H)  ALT 0 - 44 U/L 25 32 35    Lab Results  Component Value Date   WBC 2.9 (L) 06/25/2020   HGB 7.2 (L) 06/25/2020   HCT 23.1 (L) 06/25/2020   MCV 93.5 06/25/2020   PLT 339 06/25/2020   NEUTROABS 4.6 06/22/2020    CT Abdomen Pelvis Wo Contrast  Result Date: 06/02/2020 CLINICAL DATA:  Abdominal pain and fever. EXAM: CT ABDOMEN AND PELVIS WITHOUT CONTRAST TECHNIQUE: Multidetector CT imaging of the abdomen and pelvis was performed following the standard protocol without IV contrast. COMPARISON:  CT dated January 03, 2020 FINDINGS: Lower chest: Multiple airspace opacities are noted at the lung bases bilaterally. There is associated bronchial wall thickening and mucous plugging. The heart size is normal. There are coronary artery calcifications.The heart size is normal. There is a trace left-sided pleural effusion. Hepatobiliary: The liver is normal. There is cholelithiasis with questionable mild gallbladder wall thickening.There is no biliary ductal dilation. Pancreas: Normal contours without ductal dilatation. No peripancreatic fluid collection. Spleen: The spleen is borderline enlarged measuring approximately 12 cm craniocaudad. Adrenals/Urinary Tract: --Adrenal glands: The right adrenal gland is unremarkable. There is a left adrenal myelolipoma that is stable. --Right kidney/ureter: Th there is a stable small exophytic proteinaceous or hemorrhagic cyst arising from the lower pole the right kidney. There is no right-sided hydronephrosis. --Left kidney/ureter: There is no left-sided hydronephrosis. The left kidney is somewhat atrophic with areas of cortical thinning/scarring. --Urinary bladder: Unremarkable. Stomach/Bowel:  --Stomach/Duodenum: No hiatal hernia or other gastric abnormality. Normal duodenal course and caliber. --Small bowel: Unremarkable. --Colon: There is scattered colonic diverticula without CT evidence for diverticulitis. There is a large amount of stool throughout the colon. --Appendix: Normal. Vascular/Lymphatic: Atherosclerotic calcification is present within the non-aneurysmal abdominal aorta, without hemodynamically significant stenosis. --No retroperitoneal lymphadenopathy. --No mesenteric lymphadenopathy. --there is a pathologically enlarged right inguinal lymph node measuring approximately 6 by 3.7 cm (axial series 2, image 71). Reproductive: Status post hysterectomy. No adnexal mass. Other: No ascites or free air. The abdominal wall is normal. Musculoskeletal. There are innumerable lytic lesions throughout the patient's pelvis. The largest is located in the left posterior iliac crest and measures approximately 3.7 cm (axial series 2, image 59). There are small lytic lesions located in the proximal left femur that correspond well with the recent x-ray. There is a stable rounded hyperdense lesion within the marrow cavity of the proximal left femur. This is favored to be benign given its stability. There is a probable nondisplaced zone 1 fracture through the right sacrum (axial series 2, image 54). There is a probable pathologic zone 1 fracture through the left inferior sacrum (axial series 2, image 66). There is transitional vertebral anatomy with 6 lumbar type vertebral bodies. There is an old compression fracture of the L4  vertebral body. There is a new compression fracture of the L5 vertebral body resulting in approximately 50% height loss. There are lytic lesions throughout the visualized ribs. IMPRESSION: 1. Innumerable lytic lesions throughout the pelvic bones and ribs consistent with osseous metastatic disease. 2. Probable non displaced pathologic and nonpathologic fractures of the sacrum bilaterally. 3.  Pathologically enlarged 6 cm right inguinal lymph node. This is amenable to percutaneous ultrasound-guided biopsy if clinically indicated. 4. Bibasilar airspace disease concerning for multifocal pneumonia. There is a trace left-sided pleural effusion. 5. Cholelithiasis with mild gallbladder wall thickening. Correlation with ultrasound is recommended. 6. New compression fracture of the L5 vertebral body with approximately 50% height loss. There is an old L4 compression fracture. There is transitional vertebral anatomy as detailed above. 7. Diverticulosis without CT evidence for diverticulitis. 8. Borderline splenomegaly Aortic Atherosclerosis (ICD10-I70.0). Electronically Signed   By: [name] M.D.   On: 06/02/2020 20:43   DG Chest 1 View  Result Date: 06/11/2020 CLINICAL DATA:  Metastatic disease.  Shortness of breath EXAM: CHEST  1 VIEW COMPARISON:  06/02/2020 FINDINGS: Heart size is stable. There is no pneumothorax. No large pleural effusion. There is no definite focal infiltrate. Expansile left rib lesion is again noted. The pulmonary vasculature appears dilated. There is suggestion of bilateral hilar adenopathy. Aortic calcifications are noted. IMPRESSION: Cardiomegaly with vascular congestion. No definite focal infiltrate. Additional chronic findings as detailed above, likely related to the patient's reported history of metastatic disease. Electronically Signed   By: [name] M.D.   On: 06/11/2020 18:45   CT Head Wo Contrast  Result Date: 06/23/2020 CLINICAL DATA:  Altered mental status. EXAM: CT HEAD WITHOUT CONTRAST TECHNIQUE: Contiguous axial images were obtained from the base of the skull through the vertex without intravenous contrast. COMPARISON:  January 14, 2016 FINDINGS: Brain: There is moderate severity cerebral atrophy with widening of the extra-axial spaces and ventricular dilatation. There are areas of decreased attenuation within the white matter tracts of the  supratentorial brain, consistent with microvascular disease changes. Vascular: No hyperdense vessel or unexpected calcification. Skull: Negative for an acute fracture. Multiple lytic lesions are seen scattered throughout the skull. The largest is seen within the right parietal region and measures approximately 2.4 cm x 0.6 cm. This represents a new finding when compared to the prior exam. Sinuses/Orbits: There is marked severity sphenoid sinus, bilateral ethmoid sinus and bilateral maxillary sinus mucosal thickening. Other: None. IMPRESSION: 1. Multiple lytic lesions scattered throughout the skull, concerning for multiple myeloma or metastatic disease. 2. Marked severity pansinus disease. 3. No acute intracranial abnormality. Electronically Signed   By: Virgina Norfolk M.D.   On: 06/23/2020 02:33   DG Chest Port 1 View  Result Date: 06/22/2020 CLINICAL DATA:  Suspected sepsis, shortness of breath, history of smoking, asthma EXAM: PORTABLE CHEST 1 VIEW COMPARISON:  Radiograph 06/11/2020, CT 03/18/2020 FINDINGS: Increasing patchy opacities most coalescent in the left lung base/retrocardiac space and partially silhouetting the left hemidiaphragm with more diffuse hazy interstitial opacity elsewhere throughout both lungs. No pneumothorax or visible effusion. The aorta is calcified. The remaining cardiomediastinal contours are unremarkable. Redemonstrated expansile lesions in the left third and fourth ribs, similar to comparison CT. No new lytic or blastic lesions are seen. The osseous structures appear diffusely demineralized which may limit detection of small or nondisplaced fractures. Degenerative changes are present in the imaged spine and shoulders. IMPRESSION: Increasing patchy opacities most coalescent in the left lung base/retrocardiac space and partially silhouetting the left hemidiaphragm with more  diffuse hazy interstitial opacity elsewhere throughout both lungs. Findings could reflect multifocal  pneumonia versus edema. Expansile lytic lesions in the left third and fourth ribs, similar to comparison CT. Electronically Signed   By: Lovena Le M.D.   On: 06/22/2020 23:52   DG Chest Port 1 View  Result Date: 06/02/2020 CLINICAL DATA:  Sepsis. EXAM: PORTABLE CHEST 1 VIEW COMPARISON:  09/13/2019 FINDINGS: Heart size is unremarkable. Aortic calcifications are noted. There is atelectasis at the lung bases. No definite large focal infiltrate. No pneumothorax or pleural effusion. Degenerative changes are noted of both glenohumeral joints. IMPRESSION: No active disease. Electronically Signed   By: [name] M.D.   On: 06/02/2020 16:36   DG Hip Unilat W or Wo Pelvis 2-3 Views Left  Result Date: 06/02/2020 CLINICAL DATA:  Pain EXAM: DG HIP (WITH OR WITHOUT PELVIS) 2-3V LEFT COMPARISON:  05/15/2020.  CT dated January 03, 2020 FINDINGS: There are degenerative changes of both hips, left worse than right. Evaluation for fractures is limited by patient body habitus. There are increasing lucencies in the proximal left femur when compared to prior studies. IMPRESSION: 1. Evaluation limited by patient body habitus. 2. No definite acute displaced fracture or dislocation. 3. Increasing lucencies in the proximal left femur raises concern for an underlying lesion. Follow-up with a contrast-enhanced MRI is recommended. Electronically Signed   By: [name] M.D.   On: 06/02/2020 16:35   DG HIPS BILAT WITH PELVIS 3-4 VIEWS  Result Date: 06/24/2020 CLINICAL DATA:  Hip pain, no known injury, initial encounter EXAM: DG HIP (WITH OR WITHOUT PELVIS) 3-4V BILAT COMPARISON:  06/02/2020 FINDINGS: Pelvic ring appears intact. There are lytic lesions scattered throughout the pelvis which were better visualized on the most recent CT examination. No definitive pathologic fracture is noted at this time. No femoral fracture is seen. No soft tissue abnormality is noted. IMPRESSION: Lytic lesions within the pelvis  similar to that seen on prior CT examination. No definitive fracture noted at this time Electronically Signed   By: Inez Catalina M.D.   On: 06/24/2020 14:47    ASSESSMENT AND PLAN: 1. 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 Treated with multiple systemic therapies including pentostatin/Cytoxan/rituximab in 2010.   Cycle 1 bendamustine/Rituxan 05/31/2014.  Cycle 2 bendamustine/Rituxan 07/02/2014  Cycle 3 bendamustine/rituximab 07/30/2014  Cycle 4 bendamustine/rituximab 09/10/2014  Cycle 5 bendamustine/rituximab 10/22/2014  Cycle 6 bendamustine/rituximab 12/10/2014  PET scan 03/27/2019-diffuse small hypermetabolic lymph nodes in the neck, chest, abdomen, and pelvis, heterogenous hypermetabolism in the bones  CT chest 10/02/2019-bronchiectasis at the lung bases, 13 mm nodular area at the medial left hemidiaphragm, mildly enlarged mediastinal nodes-decreased from June 2020, slightly enlarged left axillary node-9 mm  Rising [name] level March and April 2021  Cycle 1 bendamustine/rituximab 11/27/2019  Cycle 2 bendamustine/Rituxan 12/26/2019  01/22/2020 [name] 4225 (stable)   Cycle 1 cyclophosphamide, pentostatin, rituximab 05/09/2020  Cycle 2 cyclophosphamide, pentostatin, rituximab 05/31/2020  Xray left hip 06/02/2020-no definite acute displaced fracture or disolcation; increasing lucencies in the proximal left femur  CT abdomen and pelvis 06/02/2020-innumerable lytic lesions throughout the pelvic bones and ribs; probable non displaced pathologic and nonpathologic fractures of the sacrum bilaterally; 6 cm right inguinal lymph node; bilateral airspace disease; new compression fracture L5; borderline splenomegaly  Palliative radiation 06/06/2020-06/12/2020  Cycle 3 cyclophosphamide, pentostatin, rituximab  06/19/2020 2. Left knee arthritis.  Followed by Dr. Wynelle Link. 3. Sleep apnea. 4. Anemia. microcytic with peripheral blood smear findings consistent with iron deficiency 07/09/2015, serum iron studies consistent with "anemia of chronic disease ", stool Hemoccult cards negative 07/11/2015 5. Question left parotitis 12/12/2013 presenting with left facial and parotid edema. 6. Admission with pneumonia February 2016 7. History of Neutropenia secondary to chemotherapy, now with moderate neutropenia 8. Left lung pneumonia 04/02/2015, sputum culture positive for Moraxella 06/19/2015 9. Low IgG and IgA 10. CT chest 05/13/2015 with bilateral lower lung consolidation 11. CT chest, abdomen, and pelvis 01/08/2016-new peribronchial thickening and peripheral tree in bud pattern at the right lower lobe, no lymphadenopathy 12. Sinus mucosal thickening/opacification on CT 08/16/2015-status post sinus surgery 12/05/2015 13. Sputum culture positive for pseudomonas aeruginosa on 01/10/2016-treated with 10 days of ciprofloxacin 14. 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 Admission with pneumonia 08/26/2016 16. Left lung nodule increased in size on a CT 01/06/2019, not hypermetabolic on the PET scan 3/90/3009, followed by Dr. Elsworth Soho 17.Admission with rectal bleeding 01/02/2020-diverticular? 18. Pain at the right arm and left iliac-likely related to lymphoma, plain x-ray 05/07/2020 at [facility] revealed destructive changes in the right humerus and radius, x-ray of the pelvis 05/15/2020-subtle lucent lesion in the right ilium 19. Hospital admission 06/02/2020 -pneumonia, AKI, anemia 20. Pain secondary to lymphoma involving the pelvis and pelvic fractures-initiation of palliative radiation 06/06/2020 21.  Hospital admission 06/23/2020-sepsis with multifocal pneumonia, acute encephalopathy, A. fib with RVR, leukopenia and anemia secondary to chemotherapy  Natasha Ellis is admitted with multifocal  pneumonia.  Continues to have altered mental status.  Had episode of A. fib with RVR yesterday.  The patient received 1 unit PRBCs this admission.  Hemoglobin this morning is 7.2 which is stable and overall consistent with her baseline.  Leukopenia also at baseline.  Blood cultures positive for coag negative staph (?  Contaminant).  Continues to have fevers and remains on IV antibiotics.  She has persistent pain due to her bone lesions.  Currently receiving chemotherapy and M spike and [name] level are slowly declining indicating a response to therapy.  Pain in her bone lesion should slowly improve with continued treatment.  Recommendations: 1.  Continue current pain medications. 2.  Continue IV antibiotics. 3.  Monitor CBC closely.  Check WBC differential.  Would consider PRBC transfusion for hemoglobin less than 7 or active bleeding. 4.  Stop Duragesic patch if confused tomorrow    LOS: 2 days   Mikey Bussing, DNP, AGPCNP-BC, AOCNP 06/25/20 Natasha Ellis was interviewed and examined.  She is admitted with a febrile illness most consistent with pneumonia.  She is now at day 7 following cycle 2 cyclophosphamide, pentostatin, and rituximab given for treatment of advanced stage Walden Strom's macroglobulinemia.  The serum [name] level has improved and the palpable right groin lymph node is smaller.  She has pain secondary to lytic lesions in the pelvis, treated with palliative radiation several weeks ago.  I recommend continue antibiotics, pain medication as needed (she had altered mental status while on a long-acting narcotic during the last hospital admission), and increasing activity level as tolerated.

## 2020-06-25 NOTE — Evaluation (Signed)
Occupational Therapy Evaluation Patient Details Name: Natasha Ellis MRN: [id number] DOB: 18-Apr-1931 Today's Date: 06/25/2020    History of Present Illness Pt is an 86 year old female with recent admission with pneumonia and AKI, now presenting to the emergency department for evaluation of fever, cough, and confusion. She was admitted for recurrent for multifocal pneumonia. She was also found ot have anemia hemoglobin around 6. 7 and received 1 unit of PRBCs.  Pt also with past medical history for Waledestrom's, non-Hodgkin's lymphoma, known metastatic disease and bone marrow infiltration: painful bony metastatic lesions of sacrum, ribs, L proximal femur, pelvis, L5 compression fracture   Clinical Impression   Pt. OT evaluation limited by pain. Pt. Refused to attempt rolling or EOB activity. Pt. HOB was elevated so pt. Could eat her lunch. Pt. Was not able to tolerate greater than28 degrees on bed. Pt. Requested bed be lowered and nursing was called for pain meds. Pt. Started to attempt to hit nurse when [facility] was raised to give meds. Pt. Was not following 1 step directions and needed total assist with ADLs. Pt. Was able to hold cup when placed in hands to consume beverage. Pt. Appears to be in severe pain and is not following directions. One time evaluation performed. If pt. Has decreased pain and her ability to follow directions increase she may be a candidate for further ot. One time evaluation performed.     Follow Up Recommendations  SNF    Equipment Recommendations  None recommended by OT    Recommendations for Other Services       Precautions / Restrictions Precautions Precautions: Fall Precaution Comments: Pain Restrictions Weight Bearing Restrictions: No Other Position/Activity Restrictions: unsure of any WB restrictions with multiple bony lesions, however from chart review, pt was performing sit to stands at SNF (see 06/19/20 oncology note)      Mobility Bed Mobility Overal  bed mobility:  (Total A of 2 to scoot her to head of bed. )                  Transfers                 General transfer comment: NT. Pt. pain is too severe    Balance                                           ADL either performed or assessed with clinical judgement   ADL Overall ADL's : Needs assistance/impaired Eating/Feeding: Maximal assistance;Bed level   Grooming: Total assistance;Bed level   Upper Body Bathing: Total assistance;Bed level   Lower Body Bathing: Total assistance;Bed level   Upper Body Dressing : Total assistance;Bed level   Lower Body Dressing: Total assistance;Bed level               Functional mobility during ADLs:  (Pt. refused any EOB orOOB activity.) General ADL Comments: Pt. was dep for      Vision   Vision Assessment?: No apparent visual deficits     Perception     Praxis      Pertinent Vitals/Pain Pain Assessment: Faces Pain Score: 8  Pain Location: back and LEs Pain Intervention(s):  (Pt. denied pain until she was sat up in bed. at more than 28)     Hand Dominance Right   Extremity/Trunk Assessment Upper Extremity Assessment Upper Extremity Assessment: Generalized weakness RUE: Unable  to fully assess due to pain LUE: Unable to fully assess due to pain           Communication Communication Communication: No difficulties   Cognition Arousal/Alertness: Lethargic;Suspect due to medications Behavior During Therapy: Anxious;Agitated Overall Cognitive Status: No family/caregiver present to determine baseline cognitive functioning                                 General Comments: Pt. very HOH and difficulty with answering orientation questions.  (pt. became agitated with nursing and attempted to hit her. )   General Comments  Pt. has bone mets and does not tolerate he bed being past 28 degrees.     Exercises     Shoulder Instructions      Home Living Family/patient  expects to be discharged to:: Skilled nursing facility                                 Additional Comments: in Skilled      Prior Functioning/Environment Level of Independence: Needs assistance;Independent with assistive device(s)  Gait / Transfers Assistance Needed: per last admission: "reports using rollator for ambulation to bathroom. Reports limited mobility secondary to pain." ADL's / Homemaking Assistance Needed: Assistance with all ADLs.            OT Problem List: Decreased activity tolerance;Pain      OT Treatment/Interventions:      OT Goals(Current goals can be found in the care plan section) Acute Rehab OT Goals Patient Stated Goal: none stated  OT Frequency:     Barriers to D/C:            Co-evaluation              AM-PAC OT "6 Clicks" Daily Activity     Outcome Measure Help from another person eating meals?: A Lot Help from another person taking care of personal grooming?: Total Help from another person toileting, which includes using toliet, bedpan, or urinal?: Total Help from another person bathing (including washing, rinsing, drying)?: Total Help from another person to put on and taking off regular upper body clothing?: Total Help from another person to put on and taking off regular lower body clothing?: Total 6 Click Score: 7   End of Session Nurse Communication: Patient requests pain meds (Pt. was attempting to hit nurse when she was giving her meds)  Activity Tolerance: Patient limited by pain Patient left: in bed;with call bell/phone within reach;with bed alarm set  Pain - part of body:  (back)                Time: 3762-8315 OT Time Calculation (min): 34 min Charges:  OT Evaluation $OT Eval Moderate Complexity: 1 Mod  Reece Packer OT/L   Jasreet Dickie 06/25/2020, 2:03 PM

## 2020-06-26 ENCOUNTER — Telehealth: Payer: Self-pay | Admitting: *Deleted

## 2020-06-26 DIAGNOSIS — A419 Sepsis, unspecified organism: Secondary | ICD-10-CM | POA: Diagnosis not present

## 2020-06-26 DIAGNOSIS — J189 Pneumonia, unspecified organism: Secondary | ICD-10-CM | POA: Diagnosis not present

## 2020-06-26 LAB — CULTURE, BLOOD (ROUTINE X 2): Special Requests: ADEQUATE

## 2020-06-26 MED ORDER — DICLOFENAC SODIUM 1 % EX GEL
2.0000 g | Freq: Four times a day (QID) | CUTANEOUS | Status: DC | PRN
  Filled 2020-06-26: qty 100

## 2020-06-26 MED ORDER — FUROSEMIDE 10 MG/ML IJ SOLN
40.0000 mg | Freq: Every day | INTRAMUSCULAR | Status: DC
  Administered 2020-06-26 – 2020-06-28 (×3): 40 mg via INTRAVENOUS
  Filled 2020-06-26 (×3): qty 4

## 2020-06-26 MED ORDER — HYDROMORPHONE BOLUS VIA INFUSION
1.0000 mg | INTRAVENOUS | Status: DC | PRN
  Administered 2020-06-29 – 2020-07-01 (×5): 1 mg via INTRAVENOUS
  Filled 2020-06-26: qty 1

## 2020-06-26 MED ORDER — HALOPERIDOL LACTATE 5 MG/ML IJ SOLN: 2.0000 mg | Freq: Four times a day (QID) | INTRAMUSCULAR | Status: DC | PRN

## 2020-06-26 MED ORDER — HYDROMORPHONE HCL 1 MG/ML IJ SOLN
1.0000 mg | INTRAMUSCULAR | Status: DC
  Administered 2020-06-26: 1 mg via INTRAVENOUS
  Filled 2020-06-26: qty 1

## 2020-06-26 MED ORDER — DILTIAZEM HCL ER COATED BEADS 120 MG PO CP24
120.0000 mg | ORAL_CAPSULE | Freq: Every day | ORAL | Status: DC
  Administered 2020-06-26 – 2020-07-01 (×2): 120 mg via ORAL
  Filled 2020-06-26 (×3): qty 1

## 2020-06-26 MED ORDER — SODIUM CHLORIDE 0.9 % IV SOLN
0.2000 mg/h | INTRAVENOUS | Status: DC
  Administered 2020-06-26 – 2020-06-27 (×2): 0.25 mg/h via INTRAVENOUS
  Administered 2020-06-30: 0.2 mg/h via INTRAVENOUS
  Filled 2020-06-26 (×2): qty 5

## 2020-06-26 NOTE — Progress Notes (Addendum)
HEMATOLOGY-ONCOLOGY PROGRESS NOTE  SUBJECTIVE: She reports breathing and pain are "ok" at present. States she was unable to sit on side of bed yesterday due to pain.   Oncology History  Waldenstrom's macroglobulinemia ([place])  04/19/2014 Initial Diagnosis   Waldenstrom's macroglobulinemia ([place])   11/27/2019 - 12/27/2019 Chemotherapy   The patient had palonosetron (ALOXI) injection 0.25 mg, 0.25 mg, Intravenous,  Once, 2 of 4 cycles Administration: 0.25 mg (11/27/2019), 0.25 mg (12/26/2019) bendamustine (BENDEKA) 175 mg in sodium chloride 0.9 % 50 mL (3.0702 mg/mL) chemo infusion, 70 mg/m2 = 175 mg (100 % of original dose 70 mg/m2), Intravenous,  Once, 2 of 4 cycles Dose modification: 70 mg/m2 (original dose 70 mg/m2, Cycle 1, Reason: Provider Judgment) Administration: 175 mg (11/27/2019), 175 mg (11/28/2019), 175 mg (12/26/2019), 175 mg (12/27/2019) riTUXimab-pvvr (RUXIENCE) 900 mg in sodium chloride 0.9 % 250 mL (2.6471 mg/mL) infusion, 375 mg/m2 = 900 mg, Intravenous,  Once, 2 of 4 cycles Administration: 900 mg (11/27/2019), 900 mg (12/26/2019)  for chemotherapy treatment.    05/09/2020 -  Chemotherapy   The patient had palonosetron (ALOXI) injection 0.25 mg, 0.25 mg, Intravenous,  Once, 3 of 4 cycles Administration: 0.25 mg (05/09/2020), 0.25 mg (05/31/2020), 0.25 mg (06/19/2020) pentostatin (NIPENT) 4.8 mg in sodium chloride 0.9 % 50 mL chemo infusion, 2 mg/m2 = 4.8 mg (100 % of original dose 2 mg/m2), Intravenous,  Once, 3 of 4 cycles Dose modification: 2 mg/m2 (original dose 2 mg/m2, Cycle 1, Reason: Provider Judgment) Administration: 4.8 mg (05/09/2020), 4.8 mg (05/31/2020), 4.6 mg (06/19/2020) cyclophosphamide (CYTOXAN) 920 mg in sodium chloride 0.9 % 250 mL chemo infusion, 400 mg/m2 = 920 mg (100 % of original dose 400 mg/m2), Intravenous,  Once, 3 of 4 cycles Dose modification: 400 mg/m2 (original dose 400 mg/m2, Cycle 1, Reason: Provider Judgment) Administration: 920 mg (05/09/2020), 920 mg  (05/31/2020), 920 mg (06/19/2020) riTUXimab-pvvr (RUXIENCE) 900 mg in sodium chloride 0.9 % 250 mL (2.6471 mg/mL) infusion, 375 mg/m2 = 900 mg, Intravenous,  Once, 3 of 4 cycles Administration: 900 mg (05/09/2020), 900 mg (05/31/2020), 900 mg (06/19/2020)  for chemotherapy treatment.     PHYSICAL EXAMINATION:  Vitals:   06/26/20 0600 06/26/20 0613  BP:  124/65  Pulse:  70  Resp: (!) 22   Temp:    SpO2:     Filed Weights   06/24/20 0500 06/25/20 0458 06/26/20 0500  Weight: 279 lb 15.8 oz (127 kg) 276 lb 3.8 oz (125.3 kg) 279 lb 12.2 oz (126.9 kg)    Intake/Output from previous day: 11/23 0701 - 11/24 0700 In: 1059.1 [P.O.:480; I.V.:126.4; IV Piggyback:452.7] Out: 700 [Urine:700]  GENERAL: Elderly female, no distress HEENT: Tongue and lips dry LYMPH: Palpable 3-4 cm right inguinal lymph node, which is overall decreasing in size   ABDOMEN: abdomen soft, non-tender and normal bowel sounds NEURO: Alert and oriented MSK: no pain with manipulation/palpation left leg/hip  LABORATORY DATA:  I have reviewed the data as listed CMP Latest Ref Rng & Units 06/25/2020 06/24/2020 06/23/2020  Glucose 70 - 99 mg/dL 96 103(H) 104(H)  BUN 8 - 23 mg/dL 39(H) 40(H) 37(H)  Creatinine 0.44 - 1.00 mg/dL 1.36(H) 1.33(H) 1.34(H)  Sodium 135 - 145 mmol/L 140 140 138  Potassium 3.5 - 5.1 mmol/L 4.4 4.6 4.9  Chloride 98 - 111 mmol/L 109 106 105  CO2 22 - 32 mmol/L _0 Calcium 8.9 - 10.3 mg/dL 8.1(L) 8.3(L) 8.0(L)  Total Protein 6.5 - 8.1 g/dL 6.4(L) 6.5 6.5  Total Bilirubin  0.3 - 1.2 mg/dL 1.8(H) 2.4(H) 2.0(H)  Alkaline Phos 38 - 126 U/L 117 121 130(H)  AST 15 - 41 U/L 30 41 52(H)  ALT 0 - 44 U/L 25 32 35    Lab Results  Component Value Date   WBC 2.9 (L) 06/25/2020   HGB 7.2 (L) 06/25/2020   HCT 23.1 (L) 06/25/2020   MCV 93.5 06/25/2020   PLT 339 06/25/2020   NEUTROABS 1.0 (L) 06/25/2020    CT Abdomen Pelvis Wo Contrast  Result Date: 06/02/2020 CLINICAL DATA:  Abdominal pain  and fever. EXAM: CT ABDOMEN AND PELVIS WITHOUT CONTRAST TECHNIQUE: Multidetector CT imaging of the abdomen and pelvis was performed following the standard protocol without IV contrast. COMPARISON:  CT dated January 03, 2020 FINDINGS: Lower chest: Multiple airspace opacities are noted at the lung bases bilaterally. There is associated bronchial wall thickening and mucous plugging. The heart size is normal. There are coronary artery calcifications.The heart size is normal. There is a trace left-sided pleural effusion. Hepatobiliary: The liver is normal. There is cholelithiasis with questionable mild gallbladder wall thickening.There is no biliary ductal dilation. Pancreas: Normal contours without ductal dilatation. No peripancreatic fluid collection. Spleen: The spleen is borderline enlarged measuring approximately 12 cm craniocaudad. Adrenals/Urinary Tract: --Adrenal glands: The right adrenal gland is unremarkable. There is a left adrenal myelolipoma that is stable. --Right kidney/ureter: Th there is a stable small exophytic proteinaceous or hemorrhagic cyst arising from the lower pole the right kidney. There is no right-sided hydronephrosis. --Left kidney/ureter: There is no left-sided hydronephrosis. The left kidney is somewhat atrophic with areas of cortical thinning/scarring. --Urinary bladder: Unremarkable. Stomach/Bowel: --Stomach/Duodenum: No hiatal hernia or other gastric abnormality. Normal duodenal course and caliber. --Small bowel: Unremarkable. --Colon: There is scattered colonic diverticula without CT evidence for diverticulitis. There is a large amount of stool throughout the colon. --Appendix: Normal. Vascular/Lymphatic: Atherosclerotic calcification is present within the non-aneurysmal abdominal aorta, without hemodynamically significant stenosis. --No retroperitoneal lymphadenopathy. --No mesenteric lymphadenopathy. --there is a pathologically enlarged right inguinal lymph node measuring approximately 6  by 3.7 cm (axial series 2, image 71). Reproductive: Status post hysterectomy. No adnexal mass. Other: No ascites or free air. The abdominal wall is normal. Musculoskeletal. There are innumerable lytic lesions throughout the patient's pelvis. The largest is located in the left posterior iliac crest and measures approximately 3.7 cm (axial series 2, image 59). There are small lytic lesions located in the proximal left femur that correspond well with the recent x-ray. There is a stable rounded hyperdense lesion within the marrow cavity of the proximal left femur. This is favored to be benign given its stability. There is a probable nondisplaced zone 1 fracture through the right sacrum (axial series 2, image 54). There is a probable pathologic zone 1 fracture through the left inferior sacrum (axial series 2, image 66). There is transitional vertebral anatomy with 6 lumbar type vertebral bodies. There is an old compression fracture of the L4 vertebral body. There is a new compression fracture of the L5 vertebral body resulting in approximately 50% height loss. There are lytic lesions throughout the visualized ribs. IMPRESSION: 1. Innumerable lytic lesions throughout the pelvic bones and ribs consistent with osseous metastatic disease. 2. Probable non displaced pathologic and nonpathologic fractures of the sacrum bilaterally. 3. Pathologically enlarged 6 cm right inguinal lymph node. This is amenable to percutaneous ultrasound-guided biopsy if clinically indicated. 4. Bibasilar airspace disease concerning for multifocal pneumonia. There is a trace left-sided pleural effusion. 5. Cholelithiasis with mild gallbladder  wall thickening. Correlation with ultrasound is recommended. 6. New compression fracture of the L5 vertebral body with approximately 50% height loss. There is an old L4 compression fracture. There is transitional vertebral anatomy as detailed above. 7. Diverticulosis without CT evidence for diverticulitis. 8.  Borderline splenomegaly Aortic Atherosclerosis (ICD10-I70.0). Electronically Signed   By: [name] M.D.   On: 06/02/2020 20:43   DG Chest 1 View  Result Date: 06/11/2020 CLINICAL DATA:  Metastatic disease.  Shortness of breath EXAM: CHEST  1 VIEW COMPARISON:  06/02/2020 FINDINGS: Heart size is stable. There is no pneumothorax. No large pleural effusion. There is no definite focal infiltrate. Expansile left rib lesion is again noted. The pulmonary vasculature appears dilated. There is suggestion of bilateral hilar adenopathy. Aortic calcifications are noted. IMPRESSION: Cardiomegaly with vascular congestion. No definite focal infiltrate. Additional chronic findings as detailed above, likely related to the patient's reported history of metastatic disease. Electronically Signed   By: [name] M.D.   On: 06/11/2020 18:45   CT Head Wo Contrast  Result Date: 06/23/2020 CLINICAL DATA:  Altered mental status. EXAM: CT HEAD WITHOUT CONTRAST TECHNIQUE: Contiguous axial images were obtained from the base of the skull through the vertex without intravenous contrast. COMPARISON:  January 14, 2016 FINDINGS: Brain: There is moderate severity cerebral atrophy with widening of the extra-axial spaces and ventricular dilatation. There are areas of decreased attenuation within the white matter tracts of the supratentorial brain, consistent with microvascular disease changes. Vascular: No hyperdense vessel or unexpected calcification. Skull: Negative for an acute fracture. Multiple lytic lesions are seen scattered throughout the skull. The largest is seen within the right parietal region and measures approximately 2.4 cm x 0.6 cm. This represents a new finding when compared to the prior exam. Sinuses/Orbits: There is marked severity sphenoid sinus, bilateral ethmoid sinus and bilateral maxillary sinus mucosal thickening. Other: None. IMPRESSION: 1. Multiple lytic lesions scattered throughout the skull,  concerning for multiple myeloma or metastatic disease. 2. Marked severity pansinus disease. 3. No acute intracranial abnormality. Electronically Signed   By: Virgina Norfolk M.D.   On: 06/23/2020 02:33   DG Chest Port 1 View  Result Date: 06/22/2020 CLINICAL DATA:  Suspected sepsis, shortness of breath, history of smoking, asthma EXAM: PORTABLE CHEST 1 VIEW COMPARISON:  Radiograph 06/11/2020, CT 03/18/2020 FINDINGS: Increasing patchy opacities most coalescent in the left lung base/retrocardiac space and partially silhouetting the left hemidiaphragm with more diffuse hazy interstitial opacity elsewhere throughout both lungs. No pneumothorax or visible effusion. The aorta is calcified. The remaining cardiomediastinal contours are unremarkable. Redemonstrated expansile lesions in the left third and fourth ribs, similar to comparison CT. No new lytic or blastic lesions are seen. The osseous structures appear diffusely demineralized which may limit detection of small or nondisplaced fractures. Degenerative changes are present in the imaged spine and shoulders. IMPRESSION: Increasing patchy opacities most coalescent in the left lung base/retrocardiac space and partially silhouetting the left hemidiaphragm with more diffuse hazy interstitial opacity elsewhere throughout both lungs. Findings could reflect multifocal pneumonia versus edema. Expansile lytic lesions in the left third and fourth ribs, similar to comparison CT. Electronically Signed   By: Lovena Le M.D.   On: 06/22/2020 23:52   DG Chest Port 1 View  Result Date: 06/02/2020 CLINICAL DATA:  Sepsis. EXAM: PORTABLE CHEST 1 VIEW COMPARISON:  09/13/2019 FINDINGS: Heart size is unremarkable. Aortic calcifications are noted. There is atelectasis at the lung bases. No definite large focal infiltrate. No pneumothorax or pleural effusion. Degenerative changes  are noted of both glenohumeral joints. IMPRESSION: No active disease. Electronically Signed   By:  [name] M.D.   On: 06/02/2020 16:36   DG Hip Unilat W or Wo Pelvis 2-3 Views Left  Result Date: 06/02/2020 CLINICAL DATA:  Pain EXAM: DG HIP (WITH OR WITHOUT PELVIS) 2-3V LEFT COMPARISON:  05/15/2020.  CT dated January 03, 2020 FINDINGS: There are degenerative changes of both hips, left worse than right. Evaluation for fractures is limited by patient body habitus. There are increasing lucencies in the proximal left femur when compared to prior studies. IMPRESSION: 1. Evaluation limited by patient body habitus. 2. No definite acute displaced fracture or dislocation. 3. Increasing lucencies in the proximal left femur raises concern for an underlying lesion. Follow-up with a contrast-enhanced MRI is recommended. Electronically Signed   By: [name] M.D.   On: 06/02/2020 16:35   DG HIPS BILAT WITH PELVIS 3-4 VIEWS  Result Date: 06/24/2020 CLINICAL DATA:  Hip pain, no known injury, initial encounter EXAM: DG HIP (WITH OR WITHOUT PELVIS) 3-4V BILAT COMPARISON:  06/02/2020 FINDINGS: Pelvic ring appears intact. There are lytic lesions scattered throughout the pelvis which were better visualized on the most recent CT examination. No definitive pathologic fracture is noted at this time. No femoral fracture is seen. No soft tissue abnormality is noted. IMPRESSION: Lytic lesions within the pelvis similar to that seen on prior CT examination. No definitive fracture noted at this time Electronically Signed   By: Inez Catalina M.D.   On: 06/24/2020 14:47    ASSESSMENT AND PLAN: 1. 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 Treated with multiple systemic therapies including pentostatin/Cytoxan/rituximab in 2010.   Cycle 1 bendamustine/Rituxan 05/31/2014.  Cycle 2 bendamustine/Rituxan 07/02/2014  Cycle 3  bendamustine/rituximab 07/30/2014  Cycle 4 bendamustine/rituximab 09/10/2014  Cycle 5 bendamustine/rituximab 10/22/2014  Cycle 6 bendamustine/rituximab 12/10/2014  PET scan 03/27/2019-diffuse small hypermetabolic lymph nodes in the neck, chest, abdomen, and pelvis, heterogenous hypermetabolism in the bones  CT chest 10/02/2019-bronchiectasis at the lung bases, 13 mm nodular area at the medial left hemidiaphragm, mildly enlarged mediastinal nodes-decreased from June 2020, slightly enlarged left axillary node-9 mm  Rising [name] level March and April 2021  Cycle 1 bendamustine/rituximab 11/27/2019  Cycle 2 bendamustine/Rituxan 12/26/2019  01/22/2020 [name] 4225 (stable)   Cycle 1 cyclophosphamide, pentostatin, rituximab 05/09/2020  Cycle 2 cyclophosphamide, pentostatin, rituximab 05/31/2020  Xray left hip 06/02/2020-no definite acute displaced fracture or disolcation; increasing lucencies in the proximal left femur  CT abdomen and pelvis 06/02/2020-innumerable lytic lesions throughout the pelvic bones and ribs; probable non displaced pathologic and nonpathologic fractures of the sacrum bilaterally; 6 cm right inguinal lymph node; bilateral airspace disease; new compression fracture L5; borderline splenomegaly  Palliative radiation 06/06/2020-06/12/2020  Cycle 3 cyclophosphamide, pentostatin, rituximab 06/19/2020  06/19/2020 [name] 3783 (improved) 2. Left knee arthritis. Followed by Dr. Wynelle Link. 3. Sleep apnea. 4. Anemia. microcytic with peripheral blood smear findings consistent with iron deficiency 07/09/2015, serum iron studies consistent with "anemia of chronic disease ", stool Hemoccult cards negative 07/11/2015 5. Question left parotitis 12/12/2013 presenting with left facial and parotid edema. 6. Admission with pneumonia February 2016 7. History of Neutropenia secondary to chemotherapy, now with moderate neutropenia 8. Left lung pneumonia 04/02/2015, sputum culture positive for Moraxella  06/19/2015 9. Low IgG and IgA 10. CT chest 05/13/2015 with bilateral lower lung consolidation  11. CT chest, abdomen, and pelvis 01/08/2016-new peribronchial thickening and peripheral tree in bud pattern at the right lower lobe, no lymphadenopathy 12. Sinus mucosal thickening/opacification on CT 08/16/2015-status post sinus surgery 12/05/2015 13. Sputum culture positive for pseudomonas aeruginosa on 01/10/2016-treated with 10 days of ciprofloxacin 14. 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 Admission with pneumonia 08/26/2016 16. Left lung nodule increased in size on a CT 01/06/2019, not hypermetabolic on the PET scan 6/44/0347, followed by Dr. Elsworth Soho 17.Admission with rectal bleeding 01/02/2020-diverticular? 18. Pain at the right arm and left iliac-likely related to lymphoma, plain x-ray 05/07/2020 at [facility] revealed destructive changes in the right humerus and radius, x-ray of the pelvis 05/15/2020-subtle lucent lesion in the right ilium 19. Hospital admission 06/02/2020 -pneumonia, AKI, anemia 20. Pain secondary to lymphoma involving the pelvis and pelvic fractures-initiation of palliative radiation 06/06/2020 21.  Hospital admission 06/23/2020-sepsis with multifocal pneumonia, acute encephalopathy, A. fib with RVR, leukopenia and anemia secondary to chemotherapy  Ms. Fuelling is not having pain this morning. We reviewed the [name] level from last week, which was improved. The right inguinal lymph node is smaller. CBC differential from yesterday shows neutropenia, stable hemoglobin.   Recommendations: 1.  Continue current pain medications. 3.  Monitor CBC closely.  Repeat CBC/differential 06/27/2020.  Consider PRBC transfusion for hemoglobin less than 7 or active bleeding. 4.  Stop Duragesic patch if confused 5.  Increase activity as tolerated.  6.  Blood cultures positive for 2 different staph species, will defer further evaluation/antibiotic coverage to medical service,  ID; currently on Vancomycin  7.  Please call oncology as needed on 06/27/2020.    LOS: 3 days   Ned Card, AGPCNP-BC 06/26/20   Ms. Goren was interviewed and examined.  She is now at day 8 following cycle 3 of systemic therapy for treatment of Walden Strom's macroglobulinemia.  She had mild neutropenia on the CBC yesterday.  She was admitted with a febrile illness with chest x-ray changes suggestive of pneumonia.  The significance of the positive blood cultures for 2 staph species is unclear.  I will defer the decision on antibiotic therapy to the medical service.  Her pain appears significantly improved today.  I was able to move the left hip and palpate the left ischium area and she did not have significant pain.  I had a discussion with her daughter by telephone regarding her current admission and prognosis.  I agree with the palliative care team that her prognosis is poor given her age, underlying lymphoma, and comorbid conditions.  I suspect she is having a clinical response to the current systemic therapy regimen based on the decreased palpable lymphadenopathy and decreased serum [name] level.  Her daughter understands that despite improvement in these measures her clinical status may not improve.  I agree with waiting to see how she does over the next week prior to making decision regarding hospice care.

## 2020-06-26 NOTE — Progress Notes (Signed)
Pt refuses CPAP QHS. 

## 2020-06-26 NOTE — Telephone Encounter (Signed)
Called to request to speak w/Dr. Benay Spice regarding her mother and prognosis. Received call from palliative team expressing a grim outlook for her and were questioning why she was getting chemotherapy with all her issues?

## 2020-06-26 NOTE — Progress Notes (Signed)
Palliative Care Progress Note  Ms. Coderre is much more mentally and cognitively clear today. She had an episode of near aspiration earlier witnessed by her RN-the RN quickly responded by raising the [facility] which elicited excruciating pain for the patient as well as her coughing episode- the patient was screaming with pain and was so upset she hit the RN trying to help her. I saw the patient following this episode. She remains in severe pain with any movement-especially movement of her trunk including bending and twisting or turning of any kind.  Ms. Labarbera tells me that she is afraid to take medications unless she knows what they are and she is worried about pain medicine. She tells me if she lies perfectly still and does not move she does not have pain but any mobilization results in unbearable pain. We discussed her pain, QOL, level of suffering and goals of care.  Ms. Bree clearly articulated her wishes directly to me today:  1. She wants comfort care only- including pain medication under palliative supervision. 2. She does not want her life prolonged in her current state. 3. She is ready for EOL and tells me how grateful she is for being able to say that and for the validation of her suffering. 4. She desires a hospice facility for EOL care.  Her daughter visited her today and they had an open and honest conversation about EOL and patient has made her wishes known to her daughter [name].   I spoke with [name] who supports her mom's decision and is agreeable to hospice care. She wants the most compassionate care possible and understands no additional treatments or interventions will be continued unless directly related to comfort and relief of suffering. She desires hospice [facility] at [facility]. I have contacted [place] liaison.  Plan:  1. Initiate hospice referral for [place] ask TOC to follow up tomorrow  2. Initiate hydromorphone infusion with hydromorphone bolus dosing for  comfort- will monitor closely and titrate the infusion for comfort. Will leave the duragesic for now. Premed bolus before moving or turning.  3. Stopped oral meds- she does want to take pills because it requires them to raise the head of her bed causing pain.  4. Diet as tolerated and for comfort-no restrictions unless signs of severe aspiration or she is unresponsive.    Time: 35 min [place]

## 2020-06-26 NOTE — Progress Notes (Signed)
PROGRESS NOTE    Natasha Ellis  VCB:[id number] DOB: [birth year] DOA: 06/22/2020 PCP: Lavone Orn, MD   Chief Complaint  Patient presents with  . Blood Infection   Brief Narrative:  ARTHA STAVROS Natasha Ellis 86 y.o.femalewith medical history significant fornon-Hodgkin's lymphoma, Waldenstrm's macroglobulinemia, atrial fibrillation on Eliquis, chronic kidney disease 3B, and recent admission with pneumonia and AKI, who presented to the emergency department for evaluation of fever, cough, and confusion.She was admitted for recurrent for multifocal pneumonia, she was started on broad-spectrum IV antibiotics.  Blood cultures grew coag negative staph, suspicious for contamination.  She was also found to have anemia, with Natasha Ellis  hemoglobin around 6.7, underwent 1 unit of PRBC transfusion.  Palliative care has been following.  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   Sepsis with multifocal pneumonia: Chest x-ray showed Increasing patchy opacities most coalescent in the left lung base/retrocardiac space and partially silhouetting the left hemidiaphragm with more diffuse hazy interstitial opacity elsewhere throughout both lungs. Findings could reflect multifocal pneumonia versus edema. She was started on broad-spectrum IV antibiotics and IV Lasix 40 mg daily Staph capitus and staph epidermidus in separate cx from 11/20 -> likely contaminates.  Repeat cx 11/24 pending.    Nasal cannula oxygen to keep sats greater than 90%.  Patient currently on 2 L of nasal cannula oxygen to keep sats greater than 90%. As above, plan for comfort measures only  Elevated BNP, ? Edema on CXR Getting diuresed.  Echo apparently pending, but as noted above, planning to transition to CMO  Pressure injury:  Wound care consulted.  Pressure Injury 06/23/20 Sacrum Mid Deep Tissue Pressure Injury - Purple or maroon localized area of discolored intact skin or blood-filled blister due to damage of underlying soft tissue from pressure and/or shear. (Active)  06/23/20 0725  Location: Sacrum  Location Orientation: Mid  Staging: Deep Tissue Pressure Injury - Purple or maroon localized area of discolored intact skin or blood-filled blister due to damage of underlying soft tissue from pressure and/or shear.  Wound Description (Comments):   Present on Admission: Yes   Atrial fibrillation with RVR Continue diltiazem 120 mg daily Continue with Eliquis for anticoagulation.  Acute encephalopathy probably secondary to multifocal pneumonia, delirious probably from severe pain due to bone mets. Continue pain management Delirium precautions  Stage IIIb CKD Creatinine appears to be at baseline at this time.  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 Benay Spice for chemotherapy. She has recently completed 3 cycles of cyclophosphamide pentostatin and rituximab. S/p completion of 5 cycles of radiation therapy during her last hospitalization. X-rays of the hips show lytic lesions scattered throughout the pelvis without any new fractures.  Recommend PT evaluation.  Pain control with fentanyl patch and oral oxycodone. Appreciate Dr. Bernette Redbird input  Neutropenia and anemia probably secondary to her chemo S/p 1 unit of PRBC transfusion. Repeat hemoglobin around 7  COPD Continue with bronchodilators as  needed.  DVT prophylaxis:eliquis Code Status: DNR Family Communication: daughter at bedside Disposition:   Status is: Inpatient  Remains inpatient appropriate because:Inpatient level of care appropriate due to severity of illness   Dispo: The patient is from: Home              Anticipated d/c is to: hospice              Anticipated d/c date is: > 3 days              Patient currently is not medically stable to d/c.  Consultants:   Oncology  Palliative care  Procedures:  none  Antimicrobials: Anti-infectives (From admission, onward)   Start     Dose/Rate Route Frequency Ordered Stop   06/24/20 1000  vancomycin (VANCOREADY) IVPB 1250 mg/250 mL  Status:  Discontinued        1,250 mg 166.7 mL/hr over 90 Minutes Intravenous Every 24 hours 06/23/20 1743 06/26/20 1000   06/24/20 0200  vancomycin (VANCOREADY) IVPB 1250 mg/250 mL  Status:  Discontinued        1,250 mg 166.7 mL/hr over 90 Minutes Intravenous Every 24 hours 06/23/20 0446 06/23/20 1743   06/23/20 1000  ceFEPIme (MAXIPIME) 2 g in sodium chloride 0.9 % 100 mL IVPB  Status:  Discontinued        2 g 200 mL/hr over 30 Minutes Intravenous Every 12 hours 06/23/20 0446 06/26/20 1811   06/23/20 0100  vancomycin (VANCOREADY) IVPB 2000 mg/400 mL        2,000 mg 200 mL/hr over 120 Minutes Intravenous  Once 06/23/20 0050 06/23/20 0329   06/23/20 0045  aztreonam (AZACTAM) 2 g in sodium chloride 0.9 % 100 mL IVPB        2 g 200 mL/hr over 30 Minutes Intravenous  Once 06/23/20 0044 06/23/20 0129   06/23/20 0045  vancomycin (VANCOCIN) IVPB 1000 mg/200 mL premix  Status:  Discontinued        1,000 mg 200 mL/hr over 60 Minutes Intravenous  Once 06/23/20 0044 06/23/20 0050   06/23/20 0045  metroNIDAZOLE (FLAGYL) IVPB 500 mg        500 mg 100 mL/hr over 60 Minutes Intravenous  Once 06/23/20 0044 06/23/20 0206     Subjective: C/o pain Notes she's uncomfortable with bed movingg Uncomfortable getting cleaned up when they changed  her depends Better after pain meds, but still there Doesn't feel well overall  Objective: Vitals:   06/26/20 0500 06/26/20 0600 06/26/20 0613 06/26/20 0827  BP:   124/65 (!) 120/56  Pulse:   70 67  Resp: 11 (!) 22  18  Temp:    97.6 F (36.4 C)  TempSrc:    Oral  SpO2:    98%  Weight: 126.9 kg     Height:        Intake/Output Summary (Last 24 hours) at 06/26/2020 1841 Last data filed at 06/26/2020 0916 Gross per 24 hour  Intake 713 ml  Output 700 ml  Net 13 ml   Filed Weights   06/24/20 0500 06/25/20 0458 06/26/20 0500  Weight: 127 kg 125.3 kg 126.9 kg    Examination:  General exam: Appears uncomfortable, anxious Respiratory system: Clear to auscultation. Respiratory effort normal. Cardiovascular system: S1 & S2 heard, RRR. Gastrointestinal system: Abdomen is nondistended, soft and nontender. Central nervous system: Alert and oriented. No focal neurological deficits. Extremities: no LEE Skin: No rashes, lesions or ulcers Psychiatry: Judgement and insight appear normal. Mood & affect appropriate.  Data Reviewed: I have personally reviewed following labs and imaging studies  CBC: Recent Labs  Lab 06/22/20 2333 06/23/20 0442 06/24/20 0405 06/25/20 0400  WBC 5.6 4.5 2.3* 2.9*  NEUTROABS 4.6  --   --  1.0*  HGB 7.7* 6.7* 7.2* 7.2*  HCT 25.7* 20.0* 22.8* 23.1*  MCV 92.8 95.2 93.1 93.5  PLT 608* 386 339 350    Basic Metabolic Panel: Recent Labs  Lab 06/22/20 2333 06/22/20 2355 06/23/20 0442 06/24/20 0405 06/25/20 0400  NA 135  --  138 140 140  K 5.3*  --  4.9 4.6 4.4  CL 105  --  105 106 109  CO2 20*  --  22 23 23   GLUCOSE 108*  --  104* 103* 96  BUN 36*  --  37* 40* 39*  CREATININE 1.39*  --  1.34* 1.33* 1.36*  CALCIUM 8.3*  --  8.0* 8.3* 8.1*  MG  --  2.2  --   --   --     GFR: Estimated Creatinine Clearance: 39.7 mL/min (Taiana Temkin) (by C-G formula based on SCr of 1.36 mg/dL (H)).  Liver Function Tests: Recent Labs  Lab 06/22/20 2333  06/23/20 0442 06/24/20 0405 06/25/20 0400  AST 68* 52* 41 30  ALT 39 35 32 25  ALKPHOS 152* 130* 121 117  BILITOT 2.1* 2.0* 2.4* 1.8*  PROT 7.4 6.5 6.5 6.4*  ALBUMIN 2.5* 2.2* 2.2* 2.0*    CBG: No results for input(s): GLUCAP in the last 168 hours.   Recent Results (from the past 240 hour(s))  Culture, blood (Routine x 2)     Status: Abnormal   Collection Time: 06/22/20 11:33 PM   Specimen: BLOOD  Result Value Ref Range Status   Specimen Description   Final    BLOOD LEFT ANTECUBITAL Performed at [facility] [address], [place], [place] [address]    Special Requests   Final    BOTTLES DRAWN AEROBIC AND ANAEROBIC Blood Culture results may not be optimal due to an inadequate volume of blood received in culture bottles Performed at [facility] [address], [place], [place] [address]    Culture  Setup Time   Final    GRAM POSITIVE COCCI IN CLUSTERS AEROBIC BOTTLE ONLY CRITICAL RESULT CALLED TO, READ BACK BY AND VERIFIED WITH: E. Mariah Milling [phone] 06/24/2020 T. TYSOR    Culture (Sharlon Pfohl)  Final    STAPHYLOCOCCUS CAPITIS THE SIGNIFICANCE OF ISOLATING THIS ORGANISM FROM Mardell Cragg SINGLE SET OF BLOOD CULTURES WHEN MULTIPLE SETS ARE DRAWN IS UNCERTAIN. PLEASE NOTIFY THE MICROBIOLOGY DEPARTMENT WITHIN ONE WEEK IF SPECIATION AND SENSITIVITIES ARE REQUIRED. Performed at [facility], [place] [address] [place], [place] [address]    Report Status 06/25/2020 FINAL  Final  Culture, blood (Routine x 2)     Status: Abnormal   Collection Time: 06/22/20 11:38 PM   Specimen: BLOOD LEFT HAND  Result Value Ref Range Status   Specimen Description   Final    BLOOD LEFT HAND Performed at [facility] [address], [place], [place] [address]    Special Requests   Final    BOTTLES DRAWN AEROBIC AND ANAEROBIC Blood Culture adequate volume Performed at [facility] [address], [place], [place] [address]     Culture  Setup Time   Final    GRAM POSITIVE COCCI IN CLUSTERS IN BOTH AEROBIC AND ANAEROBIC BOTTLES CRITICAL RESULT CALLED TO, READ BACK BY AND VERIFIED WITH:  E. JACKSON,PHARMD [id number] 06/24/2020 T. TYSOR    Culture (Bronwen Pendergraft)  Final    STAPHYLOCOCCUS EPIDERMIDIS THE SIGNIFICANCE OF ISOLATING THIS ORGANISM FROM Brynda Heick SINGLE SET OF BLOOD CULTURES WHEN MULTIPLE SETS ARE DRAWN IS UNCERTAIN. PLEASE NOTIFY THE MICROBIOLOGY DEPARTMENT WITHIN ONE WEEK IF SPECIATION AND SENSITIVITIES ARE REQUIRED. Performed at [facility], [place] [address], [place], [place] [address]    Report Status 06/26/2020 FINAL  Final  Blood Culture ID Panel (Reflexed)     Status: Abnormal   Collection Time: 06/22/20 11:38 PM  Result Value Ref Range Status   Enterococcus faecalis NOT DETECTED NOT DETECTED Final   Enterococcus Faecium NOT DETECTED NOT DETECTED Final   Listeria monocytogenes NOT DETECTED NOT DETECTED Final   Staphylococcus species DETECTED (Andrena Margerum) NOT DETECTED Final    Comment: CRITICAL RESULT CALLED TO, READ BACK BY AND VERIFIED WITH: EMariah Milling [id number] 06/24/2020 T. TYSOR    Staphylococcus aureus (BCID) NOT DETECTED NOT DETECTED Final   Staphylococcus epidermidis DETECTED (Hamlet Lasecki) NOT DETECTED Final    Comment: Methicillin (oxacillin) resistant coagulase negative staphylococcus. Possible blood culture contaminant (unless isolated from more than one blood culture draw or clinical case suggests pathogenicity). No antibiotic treatment is indicated for blood  culture contaminants. CRITICAL RESULT CALLED TO, READ BACK BY AND VERIFIED WITH: EMariah Milling [id number] 06/24/2020 T. TYSOR    Staphylococcus lugdunensis NOT DETECTED NOT DETECTED Final   Streptococcus species NOT DETECTED NOT DETECTED Final   Streptococcus agalactiae NOT DETECTED NOT DETECTED Final   Streptococcus pneumoniae NOT DETECTED NOT DETECTED Final   Streptococcus pyogenes NOT DETECTED NOT DETECTED Final   Kenslee Achorn.calcoaceticus-baumannii NOT DETECTED NOT  DETECTED Final   Bacteroides fragilis NOT DETECTED NOT DETECTED Final   Enterobacterales NOT DETECTED NOT DETECTED Final   Enterobacter cloacae complex NOT DETECTED NOT DETECTED Final   Escherichia coli NOT DETECTED NOT DETECTED Final   Klebsiella aerogenes NOT DETECTED NOT DETECTED Final   Klebsiella oxytoca NOT DETECTED NOT DETECTED Final   Klebsiella pneumoniae NOT DETECTED NOT DETECTED Final   Proteus species NOT DETECTED NOT DETECTED Final   Salmonella species NOT DETECTED NOT DETECTED Final   Serratia marcescens NOT DETECTED NOT DETECTED Final   Haemophilus influenzae NOT DETECTED NOT DETECTED Final   Neisseria meningitidis NOT DETECTED NOT DETECTED Final   Pseudomonas aeruginosa NOT DETECTED NOT DETECTED Final   Stenotrophomonas maltophilia NOT DETECTED NOT DETECTED Final   Candida albicans NOT DETECTED NOT DETECTED Final   Candida auris NOT DETECTED NOT DETECTED Final   Candida glabrata NOT DETECTED NOT DETECTED Final   Candida krusei NOT DETECTED NOT DETECTED Final   Candida parapsilosis NOT DETECTED NOT DETECTED Final   Candida tropicalis NOT DETECTED NOT DETECTED Final   Cryptococcus neoformans/gattii NOT DETECTED NOT DETECTED Final   Methicillin resistance mecA/C DETECTED (Pierre Dellarocco) NOT DETECTED Final    Comment: CRITICAL RESULT CALLED TO, READ BACK BY AND VERIFIED WITH: Gustavo Lah [id number] 06/24/2020 Mena Goes Performed at [facility], [address], [place], [place] [address]   Urine culture     Status: None   Collection Time: 06/22/20 11:43 PM   Specimen: Urine, Catheterized  Result Value Ref Range Status   Specimen Description   Final    URINE, CATHETERIZED Performed at [facility] [address] [place], [place] [address]    Special Requests   Final    NONE Performed at [facility], [place] [address], [place], [place] [address]    Culture  Final    NO GROWTH Performed at [facility], [place] [address]  [place], [place] [address]    Report Status 06/24/2020 FINAL  Final  Resp Panel by RT-PCR (Flu Ventura Leggitt&B, Covid) Nasopharyngeal Swab     Status: None   Collection Time: 06/23/20 12:30 AM   Specimen: Nasopharyngeal Swab; Nasopharyngeal(NP) swabs in vial transport medium  Result Value Ref Range Status   SARS Coronavirus 2 by RT PCR NEGATIVE NEGATIVE Final    Comment: (NOTE) SARS-CoV-2 target nucleic acids are NOT DETECTED.  The SARS-CoV-2 RNA is generally detectable in upper respiratory specimens during the acute phase of infection. The lowest concentration of SARS-CoV-2 viral copies this assay can detect is 138 copies/mL. Cecia Egge negative result does not preclude SARS-Cov-2 infection and should not be used as the sole basis for treatment or other patient management decisions. Murlene Revell negative result may occur with  improper specimen collection/handling, submission of specimen other than nasopharyngeal swab, presence of viral mutation(s) within the areas targeted by this assay, and inadequate number of viral copies(<138 copies/mL). Charde Macfarlane negative result must be combined with clinical observations, patient history, and epidemiological information. The expected result is Negative.  Fact Sheet for Patients:  EntrepreneurPulse.com.au  Fact Sheet for Healthcare Providers:  IncredibleEmployment.be  This test is no t yet approved or cleared by the [place] FDA and  has been authorized for detection and/or diagnosis of SARS-CoV-2 by FDA under an Emergency Use Authorization (EUA). This EUA will remain  in effect (meaning this test can be used) for the duration of the COVID-19 declaration under Section 564(b)(1) of the Act, 21 U.S.C.section 360bbb-3(b)(1), unless the authorization is terminated  or revoked sooner.       Influenza Larcenia Holaday by PCR NEGATIVE NEGATIVE Final   Influenza B by PCR NEGATIVE NEGATIVE Final    Comment: (NOTE) The Xpert Xpress SARS-CoV-2/FLU/RSV plus assay is  intended as an aid in the diagnosis of influenza from Nasopharyngeal swab specimens and should not be used as Zela Sobieski sole basis for treatment. Nasal washings and aspirates are unacceptable for Xpert Xpress SARS-CoV-2/FLU/RSV testing.  Fact Sheet for Patients: EntrepreneurPulse.com.au  Fact Sheet for Healthcare Providers: IncredibleEmployment.be  This test is not yet approved or cleared by the [place] FDA and has been authorized for detection and/or diagnosis of SARS-CoV-2 by FDA under an Emergency Use Authorization (EUA). This EUA will remain in effect (meaning this test can be used) for the duration of the COVID-19 declaration under Section 564(b)(1) of the Act, 21 U.S.C. section 360bbb-3(b)(1), unless the authorization is terminated or revoked.  Performed at [facility], [place] [address], [place], [place] [address]   Culture, blood (Routine X 2) w Reflex to ID Panel     Status: None (Preliminary result)   Collection Time: 06/25/20 10:22 AM   Specimen: Site Not Specified; Blood  Result Value Ref Range Status   Specimen Description   Final    SITE NOT SPECIFIED Performed at [facility] [address], [place], [place] [address]    Special Requests   Final    BOTTLES DRAWN AEROBIC ONLY Blood Culture adequate volume Performed at [facility] [address] [place], [place] [address]    Culture   Final    NO GROWTH < 24 HOURS Performed at [facility] [address] [place], [place] [address]    Report Status PENDING  Incomplete  Culture, blood (Routine X 2) w Reflex to ID Panel  Status: None (Preliminary result)   Collection Time: 06/25/20 12:06 PM   Specimen: BLOOD LEFT HAND  Result Value Ref Range Status   Specimen Description   Final    BLOOD LEFT HAND Performed at [facility] [address] [place], [place] [address]    Special Requests    Final    BOTTLES DRAWN AEROBIC ONLY Blood Culture adequate volume Performed at [facility] [address], [place], [place] [address]    Culture   Final    NO GROWTH < 24 HOURS Performed at [facility] [address] [place], [place] [address]    Report Status PENDING  Incomplete         Radiology Studies: No results found.      Scheduled Meds: . sodium chloride   Intravenous Once  . allopurinol  100 mg Oral Daily  . diltiazem  120 mg Oral Daily  . fentaNYL  1 patch Transdermal Q72H  . furosemide  40 mg Intravenous Daily  . mouth rinse  15 mL Mouth Rinse BID  . sodium chloride flush  10-40 mL Intracatheter Q12H  . sodium chloride flush  3 mL Intravenous Q12H   Continuous Infusions: . HYDROmorphone       LOS: 3 days    Time spent: over 30 min    Fayrene Helper, MD [facility]   To contact the attending provider between 7A-7P or the covering provider during after hours 7P-7A, please log into the web site [email or web address] and access using universal [place] password for that web site. If you do not have the password, please call the hospital operator.  06/26/2020, 6:41 PM

## 2020-06-26 NOTE — Consult Note (Addendum)
Palliative Care Consult Note  Natasha Ellis is an 86 yo woman with B-Cell Non Hodgkins Lymphoma that dates back to 2004 and now advanced stage Waldenstroms Macroglobulinemia with an extensive history of chemotherapy. For the past year she has had significant functional status decline and progression of painful bony lesions throughout her entire axial skeleton and bone marrow infiltration. She had a hospitalization earlier this month for PNA and is now admitted for multifocal PNA, pain crisis and altered mental status after receiving chemotherapy on 11/17. Due to functional status decline she moved [place] apartment at [facility] into skilled care following last hospital stay. On my visit she is acutely delirious, agitated and screams out in pain with any movement. Since admission she has had difficult to control pain, delirium, persistent fevers, Afib and is not eating.  I spoke with her HCPOA's her children [name] and Annie Main by phone this evening. I introduced the concept of palliative care and attempted to ellicit the patients values and goals of care. [name] has been the patients primary caregiver and is here locally.I shared with them clinical information that according to them they had not had access to and were not aware of -we discussed all of the challenges and issues. [name] and Annie Main both related their mother conditions to self neglect and bad choices in her life style etc. and [name] seemed to feel like her mother's pain and deconditioning were related to anticipatory suffering and possibly implied some exaggeration existed-she tells me her mom "wont move or participate". [name] has signs of caregiver burn out and is frustrated with her mom's care and condition. [name] thought on her mom's last admission that her mom's condition was reversible with treatment.  I shared with them my concerns about the following:  Natasha Ellis has MANY real reasons to have severe pain including wide spread bony  lesions throughout her entire skeleton as well as pathologic fractures. She has functional status decline, poor appetite and delirium with second hospital admission this month for multifocal PNA and she had recent chemotherapy on 11/17.  Recent head CT this admission showed expanding skull lesions. Recent pelvic CT showed innumerable lytic lesions in the pelvis and she had radiation done which helped with her pain until her most recent chemotherapy.  She has an acute sacral insufficiency fracture and pathologic fractures of the pelvis which are extremely painful and will limit her ability to ambulate or bear weight-likely indefinitely. She will likely not progress with PT due to severe pain and will avoid movement. These fractures often never heal and often take months for improvement.  She also has lumbar vertebral compression fractures at L4 L5 that are new appearing.  She has Chronic Lung disease with long smoking hx -multifocal PNA, CPAP and O2 dependent at home.  Elevated BNP with diastolic HF at baseline.  Afib w/rvr this admission now on cardizem drip  She has an expansive lesion involving the 3rd rib- she is splinting respirations and not taking deep breaths making PNA recurrence more likely.  AKI and Chronic Kidney disease   We discussed their mother's QOL which they both agree has been very poor for the last few months. [name] was expecting recovery and improvement but also shares she doesn't think her mom will walk again and acknowledges this last round of treatment with hospitalizations has been much more difficult than expected.  I attempted to determine values with regard to transitioning to comfort care and to better understand what their expectations were for their mom.  Annie Main believe his mother would benefit from hospice care and transition to full comfort -his visit today was very hard for him to see her suffering. [name] became very overwhelmed during the call and emotional and  seemed unprepared and resisitant to participate the conversation about  EOL.  I requested Barnabas Lister and [name] discuss their mothers goals of care and I would check in tomorrow. [name] would like to speak with Dr. Benay Spice about her mother's condition and prognosis.  Recommendations:  1. Re-evaluate prognosis, treatment options and goals with family, consider transition to comfort care given poor QOL, difficult to manage pain and suffering and functional status decline.  2. Added Hydromorphone for pain and stopped PO morphine given her renal function.  3. Noted difficulty with duragesic in past- we can consider changing this.  4. Add bone pain adjuvants- low dose decadron, calcitonin   5. If goal is comfort consider adding sedative for agitation.  Will follow closely for needs.  Lane Hacker, DO Palliative Medicine

## 2020-06-26 NOTE — Progress Notes (Signed)
[facility])  Referral received from Dr. Hilma Favors for residential hospice at [facility].  [name] does not have a bed to offer for Thursday unfortunately.  [facility] will update TOC and family once a bed is available.  Thank you, Venia Carbon RN, BSN, [facility] Liaison

## 2020-06-26 NOTE — Progress Notes (Signed)
Pharmacy Antibiotic Note  Natasha Ellis is a 86 y.o. female with hx non-Hodgkin's lymphoma and Waldenstrm's macroglobulinemia on chemotherapy PTE (last cycle on 11/17), presented to the ED on 06/22/2020 with AMS and fever. CXR showed findings with suspicion for PNA. She's currently on abx for infection.  Today, 06/26/2020: - day #4 abx - afeb, wbc low - CKD, scr on 11/23 was 1.36 (crcl~34N) - blood culture with staph epi and capitis --> suspects contamination  Plan: - continue cefepime 2gm IV q12h - monitor renal function ___________________________________________  Height: 5\' 6"  (167.6 cm) Weight: 126.9 kg (279 lb 12.2 oz) IBW/kg (Calculated) : 59.3  Temp (24hrs), Avg:97.7 F (36.5 C), Min:97.6 F (36.4 C), Max:98.1 F (36.7 C)  Recent Labs  Lab 06/19/20 1015 06/22/20 2333 06/23/20 0045 06/23/20 0442 06/24/20 0405 06/25/20 0400  WBC 3.9* 5.6  --  4.5 2.3* 2.9*  CREATININE 1.24* 1.39*  --  1.34* 1.33* 1.36*  LATICACIDVEN  --   --  1.5  --   --   --     Estimated Creatinine Clearance: 39.7 mL/min (A) (by C-G formula based on SCr of 1.36 mg/dL (H)).    Allergies  Allergen Reactions  . Codeine Other (See Comments)    Crazy-nightmares  . Amoxicillin Diarrhea        . Buspar [Buspirone] Other (See Comments)    Unknown -- OFF MAR   . Cefdinir Cough  . Doxycycline Diarrhea  . Erythromycin Other (See Comments)    GI upset  . Lisinopril Cough  . Morphine And Related Other (See Comments)    Unknown -- of MAR   . Myrbetriq Andree Elk Er] Other (See Comments)    Off MAR unknown reaction  . Oxybutynin Other (See Comments)    Dry mouth  . Sulfa Antibiotics Other (See Comments)    GI upset  . Toviaz [Fesoterodine Fumarate Er] Other (See Comments)    Unknown off MAR   . Tramadol Hcl Other (See Comments)    Unknown reaction --OFF MAR   . Vesicare [Solifenacin Succinate] Other (See Comments)    Unknown off MAR    Antimicrobials this admission: 11/21 aztrenam x  1 11/21 flagyl x1 11/21 vancomycin >>11/24 11/21 cefepime >>    Microbiology results: 11/20 BCx: 3/4  GPC in clusters (BCID= staph epi, MecA +), staph epi and staph capitis FINAL 11/20 UCx: neg FINAL 11/23 bcx x2:   Thank you for allowing pharmacy to be a part of this patient's care.  Lynelle Doctor 06/26/2020 9:29 AM

## 2020-06-27 DIAGNOSIS — J189 Pneumonia, unspecified organism: Secondary | ICD-10-CM | POA: Diagnosis not present

## 2020-06-27 DIAGNOSIS — A419 Sepsis, unspecified organism: Secondary | ICD-10-CM | POA: Diagnosis not present

## 2020-06-27 NOTE — TOC Progression Note (Signed)
Transition of Care [facility]) - Progression Note    Patient Details  Name: Natasha Ellis MRN: [id number] Date of Birth: 12-08-1930  Transition of Care [facility]) CM/SW Contact  Ross Ludwig,  Phone Number: 06/27/2020, 5:03 PM  Clinical Narrative:     CSW contacted [facility] to ask about bed availability.  They do not have a bed available yet.  CSW to follow up tomorrow.     Expected Discharge Plan and [facility] facility.                                               Social Determinants of Health (SDOH) Interventions    Readmission Risk Interventions Readmission Risk Prevention Plan 06/12/2020 01/09/2019 01/09/2019  Transportation Screening Complete Complete Complete  PCP or Specialist Appt within 3-5 Days Complete Not Complete Not Complete  Not Complete comments - not ready for d/c -  [place] or Home Care Consult Complete Complete -  Social Work Consult for [place] Planning/Counseling Complete Complete -  Palliative Care Screening Not Applicable Not Applicable -  Medication Review Press photographer) Complete Complete -  Some recent data might be hidden

## 2020-06-27 NOTE — Progress Notes (Addendum)
PROGRESS NOTE    Natasha Ellis  HUD:[id number] DOB: 1931-05-21 DOA: 06/22/2020 PCP: Lavone Orn, MD   Chief Complaint  Patient presents with  . Blood Infection   Brief Narrative:  Natasha Ellis Natasha Ellis 86 y.o.femalewith medical history significant fornon-Hodgkin's lymphoma, Waldenstrm's macroglobulinemia, atrial fibrillation on Eliquis, chronic kidney disease 3B, and recent admission with pneumonia and AKI, who presented to the emergency department for evaluation of fever, cough, and confusion.She was admitted for recurrent for multifocal pneumonia, she was started on broad-spectrum IV antibiotics.  Blood cultures grew coag negative staph, suspicious for contamination.  She was also found to have anemia, with Marquon Alcala  hemoglobin around 6.7, underwent 1 unit of PRBC transfusion.  Palliative care has been following.  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  She's lethargic today on gtt, unable to speak to me, will d/c fentanyl patch.  Dr. Hilma Favors to make adjustments to gtt.  May be difficult to balance pain with oversedation, but will continue to adjust prn.  Sepsis with multifocal pneumonia: Chest x-ray showed Increasing patchy opacities most coalescent in the left lung base/retrocardiac space and partially silhouetting the left hemidiaphragm with more diffuse hazy interstitial opacity elsewhere throughout both lungs. Findings could reflect multifocal  pneumonia versus edema. She was started on broad-spectrum IV antibiotics and IV Lasix 40 mg daily Staph capitus and staph epidermidus in separate cx from 11/20 -> likely contaminates.  Repeat cx 11/24 pending.   Nasal cannula oxygen to keep sats greater than 90%.  Patient currently on 2 L of nasal cannula oxygen to keep sats greater than 90%. As above, plan for comfort measures only  Elevated BNP, ? Edema on CXR Getting diuresed.  Echo apparently pending, but as noted above, planning to transition to CMO  Pressure injury:  Wound care consulted.  Pressure Injury 06/23/20 Sacrum Mid Deep Tissue Pressure Injury - Purple or maroon localized area of discolored intact skin or blood-filled blister due to damage of underlying soft tissue from pressure and/or shear. (Active)  06/23/20 0725  Location: Sacrum  Location Orientation: Mid  Staging: Deep Tissue Pressure Injury - Purple or maroon localized area of discolored intact skin or blood-filled blister due to damage of underlying soft tissue from pressure and/or shear.  Wound Description (Comments):   Present on Admission: Yes   Atrial fibrillation with RVR Continue diltiazem 120 mg daily Continue with Eliquis for anticoagulation.  Acute encephalopathy probably secondary to multifocal pneumonia, delirious probably from severe pain due to bone mets. Continue pain management Delirium precautions  Stage IIIb CKD Creatinine appears to be at baseline at this time.  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 Benay Spice for chemotherapy. She has recently completed 3 cycles of cyclophosphamide pentostatin and rituximab. S/p completion of 5 cycles of radiation therapy during her last hospitalization. X-rays of the hips show lytic lesions scattered throughout the pelvis without any new fractures.  Recommend PT evaluation.  Pain control with fentanyl patch and oral  oxycodone. Appreciate Dr. Bernette Redbird input  Neutropenia and anemia probably secondary to her chemo S/p 1 unit of PRBC transfusion. Repeat hemoglobin around 7  COPD Continue with bronchodilators as needed.  DVT prophylaxis:eliquis Code Status: DNR Family Communication: son over phone Disposition:   Status is: Inpatient  Remains inpatient appropriate because:Inpatient level of care appropriate due to severity of illness   Dispo: The patient is from: Home              Anticipated d/c is to: hospice              Anticipated d/c date is: > 3 days              Patient currently is not medically stable to d/c.  Consultants:   Oncology  Palliative care  Procedures:  none  Antimicrobials: Anti-infectives (From admission, onward)   Start     Dose/Rate Route Frequency Ordered Stop   06/24/20 1000  vancomycin (VANCOREADY) IVPB 1250 mg/250 mL  Status:  Discontinued        1,250 mg 166.7 mL/hr over 90 Minutes Intravenous Every 24 hours 06/23/20 1743 06/26/20 1000   06/24/20 0200  vancomycin (VANCOREADY) IVPB 1250 mg/250 mL  Status:  Discontinued        1,250 mg 166.7 mL/hr over 90 Minutes Intravenous Every 24 hours 06/23/20 0446 06/23/20 1743   06/23/20 1000  ceFEPIme (MAXIPIME) 2 g in sodium chloride 0.9 % 100 mL IVPB  Status:  Discontinued        2 g 200 mL/hr over 30 Minutes Intravenous Every 12 hours 06/23/20 0446 06/26/20 1811   06/23/20 0100  vancomycin (VANCOREADY) IVPB 2000 mg/400 mL        2,000 mg 200 mL/hr over 120 Minutes Intravenous  Once 06/23/20 0050 06/23/20 0329   06/23/20 0045  aztreonam (AZACTAM) 2 g in sodium chloride 0.9 % 100 mL IVPB        2 g 200 mL/hr over 30 Minutes Intravenous  Once 06/23/20 0044 06/23/20 0129   06/23/20 0045  vancomycin (VANCOCIN) IVPB 1000 mg/200 mL premix  Status:  Discontinued        1,000 mg 200 mL/hr over 60 Minutes Intravenous  Once 06/23/20 0044 06/23/20 0050   06/23/20 0045  metroNIDAZOLE (FLAGYL) IVPB 500 mg        500  mg 100 mL/hr over 60 Minutes Intravenous  Once 06/23/20 0044 06/23/20 0206     Subjective: Unable to speak, lethargic  Objective: Vitals:   06/26/20 0613 06/26/20 0827 06/27/20 0633 06/27/20 1257  BP: 124/65 (!) 120/56 (!) 107/52 105/74  Pulse: 70 67 (!) 56 (!) 108  Resp:  18 16 14   Temp:  97.6 F (36.4 C) 98.3 F (36.8 C) 98.3 F (36.8 C)  TempSrc:  Oral Oral Oral  SpO2:  98% 91% 96%  Weight:      Height:        Intake/Output Summary (Last 24 hours) at 06/27/2020 1333 Last data filed at 06/27/2020 0537 Gross per 24 hour  Intake 234.84 ml  Output --  Net 234.84 ml   Filed Weights   06/24/20 0500 06/25/20 0458 06/26/20 0500  Weight: 127 kg 125.3 kg 126.9 kg    Examination:  General: lethargic Cardiovascular: Heart sounds show Skai Lickteig regular rate, and rhythm.  Lungs: unlabored Abdomen: nontender Neurological: lethargic, attends to voice and opens eyes, but unable to say anything to me due to lethargy.  Moving all  extremities.  Skin: Warm and dry. No rashes or lesions. Extremities: No clubbing or cyanosis. No edema.    Data Reviewed: I have personally reviewed following labs and imaging studies  CBC: Recent Labs  Lab 06/22/20 2333 06/23/20 0442 06/24/20 0405 06/25/20 0400  WBC 5.6 4.5 2.3* 2.9*  NEUTROABS 4.6  --   --  1.0*  HGB 7.7* 6.7* 7.2* 7.2*  HCT 25.7* 20.0* 22.8* 23.1*  MCV 92.8 95.2 93.1 93.5  PLT 608* 386 339 300    Basic Metabolic Panel: Recent Labs  Lab 06/22/20 2333 06/22/20 2355 06/23/20 0442 06/24/20 0405 06/25/20 0400  NA 135  --  138 140 140  K 5.3*  --  4.9 4.6 4.4  CL 105  --  105 106 109  CO2 20*  --  22 23 23   GLUCOSE 108*  --  104* 103* 96  BUN 36*  --  37* 40* 39*  CREATININE 1.39*  --  1.34* 1.33* 1.36*  CALCIUM 8.3*  --  8.0* 8.3* 8.1*  MG  --  2.2  --   --   --     GFR: Estimated Creatinine Clearance: 39.7 mL/min (Daiden Coltrane) (by C-G formula based on SCr of 1.36 mg/dL (H)).  Liver Function Tests: Recent Labs  Lab  06/22/20 2333 06/23/20 0442 06/24/20 0405 06/25/20 0400  AST 68* 52* 41 30  ALT 39 35 32 25  ALKPHOS 152* 130* 121 117  BILITOT 2.1* 2.0* 2.4* 1.8*  PROT 7.4 6.5 6.5 6.4*  ALBUMIN 2.5* 2.2* 2.2* 2.0*    CBG: No results for input(s): GLUCAP in the last 168 hours.   Recent Results (from the past 240 hour(s))  Culture, blood (Routine x 2)     Status: Abnormal   Collection Time: 06/22/20 11:33 PM   Specimen: BLOOD  Result Value Ref Range Status   Specimen Description   Final    BLOOD LEFT ANTECUBITAL Performed at [facility] [address] [place], [place] [address]    Special Requests   Final    BOTTLES DRAWN AEROBIC AND ANAEROBIC Blood Culture results may not be optimal due to an inadequate volume of blood received in culture bottles Performed at [facility] [address] [place], [place] [address]    Culture  Setup Time   Final    GRAM POSITIVE COCCI IN CLUSTERS AEROBIC BOTTLE ONLY CRITICAL RESULT CALLED TO, READ BACK BY AND VERIFIED WITH: E. Mariah Milling [phone] 06/24/2020 T. TYSOR    Culture (Jmari Pelc)  Final    STAPHYLOCOCCUS CAPITIS THE SIGNIFICANCE OF ISOLATING THIS ORGANISM FROM Leydy Worthey SINGLE SET OF BLOOD CULTURES WHEN MULTIPLE SETS ARE DRAWN IS UNCERTAIN. PLEASE NOTIFY THE MICROBIOLOGY DEPARTMENT WITHIN ONE WEEK IF SPECIATION AND SENSITIVITIES ARE REQUIRED. Performed at [facility], [place] [address] [place], [place] [address]    Report Status 06/25/2020 FINAL  Final  Culture, blood (Routine x 2)     Status: Abnormal   Collection Time: 06/22/20 11:38 PM   Specimen: BLOOD LEFT HAND  Result Value Ref Range Status   Specimen Description   Final    BLOOD LEFT HAND Performed at [facility] [address] [place], [place] [address]    Special Requests   Final    BOTTLES DRAWN AEROBIC AND ANAEROBIC Blood Culture adequate volume Performed at [facility] [address], [place], [place]  [address]    Culture  Setup Time   Final    Lonell Grandchild  POSITIVE COCCI IN CLUSTERS IN BOTH AEROBIC AND ANAEROBIC BOTTLES CRITICAL RESULT CALLED TO, READ BACK BY AND VERIFIED WITH: E. Mariah Milling [id number] 06/24/2020 T. TYSOR    Culture (Sharlee Rufino)  Final    STAPHYLOCOCCUS EPIDERMIDIS THE SIGNIFICANCE OF ISOLATING THIS ORGANISM FROM Dameer Speiser SINGLE SET OF BLOOD CULTURES WHEN MULTIPLE SETS ARE DRAWN IS UNCERTAIN. PLEASE NOTIFY THE MICROBIOLOGY DEPARTMENT WITHIN ONE WEEK IF SPECIATION AND SENSITIVITIES ARE REQUIRED. Performed at [facility], [place] [address] [place], [place] [address]    Report Status 06/26/2020 FINAL  Final  Blood Culture ID Panel (Reflexed)     Status: Abnormal   Collection Time: 06/22/20 11:38 PM  Result Value Ref Range Status   Enterococcus faecalis NOT DETECTED NOT DETECTED Final   Enterococcus Faecium NOT DETECTED NOT DETECTED Final   Listeria monocytogenes NOT DETECTED NOT DETECTED Final   Staphylococcus species DETECTED (Viyan Rosamond) NOT DETECTED Final    Comment: CRITICAL RESULT CALLED TO, READ BACK BY AND VERIFIED WITH: EMariah Milling [id number] 06/24/2020 T. TYSOR    Staphylococcus aureus (BCID) NOT DETECTED NOT DETECTED Final   Staphylococcus epidermidis DETECTED (Takari Lundahl) NOT DETECTED Final    Comment: Methicillin (oxacillin) resistant coagulase negative staphylococcus. Possible blood culture contaminant (unless isolated from more than one blood culture draw or clinical case suggests pathogenicity). No antibiotic treatment is indicated for blood  culture contaminants. CRITICAL RESULT CALLED TO, READ BACK BY AND VERIFIED WITH: EMariah Milling [id number] 06/24/2020 T. TYSOR    Staphylococcus lugdunensis NOT DETECTED NOT DETECTED Final   Streptococcus species NOT DETECTED NOT DETECTED Final   Streptococcus agalactiae NOT DETECTED NOT DETECTED Final   Streptococcus pneumoniae NOT DETECTED NOT DETECTED Final   Streptococcus pyogenes NOT DETECTED NOT DETECTED Final   Tamakia Porto.calcoaceticus-baumannii NOT DETECTED  NOT DETECTED Final   Bacteroides fragilis NOT DETECTED NOT DETECTED Final   Enterobacterales NOT DETECTED NOT DETECTED Final   Enterobacter cloacae complex NOT DETECTED NOT DETECTED Final   Escherichia coli NOT DETECTED NOT DETECTED Final   Klebsiella aerogenes NOT DETECTED NOT DETECTED Final   Klebsiella oxytoca NOT DETECTED NOT DETECTED Final   Klebsiella pneumoniae NOT DETECTED NOT DETECTED Final   Proteus species NOT DETECTED NOT DETECTED Final   Salmonella species NOT DETECTED NOT DETECTED Final   Serratia marcescens NOT DETECTED NOT DETECTED Final   Haemophilus influenzae NOT DETECTED NOT DETECTED Final   Neisseria meningitidis NOT DETECTED NOT DETECTED Final   Pseudomonas aeruginosa NOT DETECTED NOT DETECTED Final   Stenotrophomonas maltophilia NOT DETECTED NOT DETECTED Final   Candida albicans NOT DETECTED NOT DETECTED Final   Candida auris NOT DETECTED NOT DETECTED Final   Candida glabrata NOT DETECTED NOT DETECTED Final   Candida krusei NOT DETECTED NOT DETECTED Final   Candida parapsilosis NOT DETECTED NOT DETECTED Final   Candida tropicalis NOT DETECTED NOT DETECTED Final   Cryptococcus neoformans/gattii NOT DETECTED NOT DETECTED Final   Methicillin resistance mecA/C DETECTED (Azayla Polo) NOT DETECTED Final    Comment: CRITICAL RESULT CALLED TO, READ BACK BY AND VERIFIED WITH: Gustavo Lah [phone] 06/24/2020 Mena Goes Performed at [facility], [address], [place], [place] [address]   Urine culture     Status: None   Collection Time: 06/22/20 11:43 PM   Specimen: Urine, Catheterized  Result Value Ref Range Status   Specimen Description   Final    URINE, CATHETERIZED Performed at [facility] [address] [place],  [address]    Special Requests   Final  NONE Performed at [facility], [place] [address], [place], [place] [address]    Culture   Final    NO GROWTH Performed at [facility], [place] [address]  [place], [place] [address]    Report Status 06/24/2020 FINAL  Final  Resp Panel by RT-PCR (Flu Zissy Hamlett&B, Covid) Nasopharyngeal Swab     Status: None   Collection Time: 06/23/20 12:30 AM   Specimen: Nasopharyngeal Swab; Nasopharyngeal(NP) swabs in vial transport medium  Result Value Ref Range Status   SARS Coronavirus 2 by RT PCR NEGATIVE NEGATIVE Final    Comment: (NOTE) SARS-CoV-2 target nucleic acids are NOT DETECTED.  The SARS-CoV-2 RNA is generally detectable in upper respiratory specimens during the acute phase of infection. The lowest concentration of SARS-CoV-2 viral copies this assay can detect is 138 copies/mL. Ej Pinson negative result does not preclude SARS-Cov-2 infection and should not be used as the sole basis for treatment or other patient management decisions. Oracio Galen negative result may occur with  improper specimen collection/handling, submission of specimen other than nasopharyngeal swab, presence of viral mutation(s) within the areas targeted by this assay, and inadequate number of viral copies(<138 copies/mL). Fredric Slabach negative result must be combined with clinical observations, patient history, and epidemiological information. The expected result is Negative.  Fact Sheet for Patients:  EntrepreneurPulse.com.au  Fact Sheet for Healthcare Providers:  IncredibleEmployment.be  This test is no t yet approved or cleared by the [place] FDA and  has been authorized for detection and/or diagnosis of SARS-CoV-2 by FDA under an Emergency Use Authorization (EUA). This EUA will remain  in effect (meaning this test can be used) for the duration of the COVID-19 declaration under Section 564(b)(1) of the Act, 21 U.S.C.section 360bbb-3(b)(1), unless the authorization is terminated  or revoked sooner.       Influenza Ranelle Auker by PCR NEGATIVE NEGATIVE Final   Influenza B by PCR NEGATIVE NEGATIVE Final    Comment: (NOTE) The Xpert Xpress SARS-CoV-2/FLU/RSV plus assay is  intended as an aid in the diagnosis of influenza from Nasopharyngeal swab specimens and should not be used as Valen Mascaro sole basis for treatment. Nasal washings and aspirates are unacceptable for Xpert Xpress SARS-CoV-2/FLU/RSV testing.  Fact Sheet for Patients: EntrepreneurPulse.com.au  Fact Sheet for Healthcare Providers: IncredibleEmployment.be  This test is not yet approved or cleared by the [place] FDA and has been authorized for detection and/or diagnosis of SARS-CoV-2 by FDA under an Emergency Use Authorization (EUA). This EUA will remain in effect (meaning this test can be used) for the duration of the COVID-19 declaration under Section 564(b)(1) of the Act, 21 U.S.C. section 360bbb-3(b)(1), unless the authorization is terminated or revoked.  Performed at [facility], [place] [address] [place], [place] [address]   Culture, blood (Routine X 2) w Reflex to ID Panel     Status: None (Preliminary result)   Collection Time: 06/25/20 10:22 AM   Specimen: Site Not Specified; Blood  Result Value Ref Range Status   Specimen Description   Final    SITE NOT SPECIFIED Performed at [facility] [address], [place], [place] [address]    Special Requests   Final    BOTTLES DRAWN AEROBIC ONLY Blood Culture adequate volume Performed at [facility] [address], [place], [place] [address]    Culture   Final    NO GROWTH 2 DAYS Performed at [facility] [address] [place], [place] [address]  Report Status PENDING  Incomplete  Culture, blood (Routine X 2) w Reflex to ID Panel     Status: None (Preliminary result)   Collection Time: 06/25/20 12:06 PM   Specimen: BLOOD LEFT HAND  Result Value Ref Range Status   Specimen Description   Final    BLOOD LEFT HAND Performed at [facility] [address] [place], [place] [address]    Special Requests   Final     BOTTLES DRAWN AEROBIC ONLY Blood Culture adequate volume Performed at [facility] [address], [place], [place] [address]    Culture   Final    NO GROWTH 2 DAYS Performed at [facility] [address] [place], [place] [address]    Report Status PENDING  Incomplete         Radiology Studies: No results found.      Scheduled Meds: . sodium chloride   Intravenous Once  . allopurinol  100 mg Oral Daily  . diltiazem  120 mg Oral Daily  . furosemide  40 mg Intravenous Daily  . mouth rinse  15 mL Mouth Rinse BID  . sodium chloride flush  10-40 mL Intracatheter Q12H  . sodium chloride flush  3 mL Intravenous Q12H   Continuous Infusions: . HYDROmorphone 0.25 mg/hr (06/27/20 0538)     LOS: 4 days    Time spent: over 30 min    Fayrene Helper, MD [facility]   To contact the attending provider between 7A-7P or the covering provider during after hours 7P-7A, please log into the web site [email or web address] and access using universal [place] password for that web site. If you do not have the password, please call the hospital operator.  06/27/2020, 1:33 PM

## 2020-06-27 NOTE — Plan of Care (Signed)
  Problem: Pain Managment: Goal: General experience of comfort will improve Outcome: Progressing   

## 2020-06-28 DIAGNOSIS — J189 Pneumonia, unspecified organism: Secondary | ICD-10-CM | POA: Diagnosis not present

## 2020-06-28 DIAGNOSIS — A419 Sepsis, unspecified organism: Secondary | ICD-10-CM | POA: Diagnosis not present

## 2020-06-28 NOTE — Progress Notes (Signed)
Palliative Care Progress Note  Mrs. Pae was resting very comfortably this morning. I started her on a low dose hydromorphone infusion on Wednesday evening after her goals for comfort and transition to hospice care were clarified. She slept through the night not requiring any bolus doses. Her fentanyl patch was removed on Thursday afternoon- so that we could simplify her regimen and have more control over her symptom management using the basal infusion and bolus doses for pain especially when moving or turning her. She was still very sedated this morning so I decreased her infusion to .10mg /hr from .20mg /hr  (~2.5 mg in 24 hours only) which is much less medication that she has been receiving in general-it usually takes about 24 hours minimum for the duragesic dose depot to clear her system and possibly a bit longer given her renal function. I evaluated her about 2 hours after the dose change was made. She appeared comfortable and in no distress. Her mouth was very dry so her RN and I cleaned and suctioned out her mouth. She opened her eyes and was able to cough to clear her secretions. She was able to squeeze my hand and make eye contact.   Our goal is to give her nothing more or nothing less than what she needs for her complete comfort. I suspect much of her sedation is not just a result of her opioids but likely finally getting relief after days of not sleeping well and struggling with pain and possibly the emotional and existential relief of expressing her desire for comfort care and acceptance of EOL approaching.Her respirations were normal and regular and her pupils were not pinpoint.   Her family is planning on visiting today. There are no beds available at [facility]. I discussed her pain regimen and goals with RN at bedside. We will watch her closely on the lower infusion rate and may need to make adjustments as the duragesic leaves her system. Will use bolus dosing for any movement or care that  requires turning or repositioning.  I spoke with both her daughter [name] and her son Richardson Landry to update them on her condition.   Will continue to follow closely.  Lane Hacker, DO Palliative Medicine  Time: 35 min Greater than 50%  of this time was spent counseling and coordinating care related to the above assessment and plan.

## 2020-06-28 NOTE — Progress Notes (Signed)
PROGRESS NOTE    Natasha Ellis  QMG:[id number] DOB: 05-01-1931 DOA: 06/22/2020 PCP: Lavone Orn, MD   Chief Complaint  Patient presents with  . Blood Infection   Brief Narrative:  Natasha Ellis 86 y.o.femalewith medical history significant fornon-Hodgkin's lymphoma, Waldenstrm's macroglobulinemia, atrial fibrillation on Eliquis, chronic kidney disease 3B, and recent admission with pneumonia and AKI, who presented to the emergency department for evaluation of fever, cough, and confusion.She was admitted for recurrent for multifocal pneumonia, she was started on broad-spectrum IV antibiotics.  Blood cultures grew coag negative staph, suspicious for contamination.  She was also found to have anemia, with Toshiro Hanken  hemoglobin around 6.7, underwent 1 unit of PRBC transfusion.  Palliative care has been following.  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  Fentanyl patch d/c'd yesterday.  Dr. Hilma Favors to make adjustments to dilaudid gtt, discussed this AM.  She continues to be sedated on analgesia for discomfort, will try to balance pain/comfort with sedation, appreciate palliative care assistance.    Sepsis with multifocal pneumonia: Chest x-ray showed Increasing patchy opacities most coalescent in the left lung base/retrocardiac space and partially silhouetting the left hemidiaphragm with more diffuse hazy interstitial opacity elsewhere throughout  both lungs. Findings could reflect multifocal pneumonia versus edema. She was started on broad-spectrum IV antibiotics and IV Lasix 40 mg daily  Staph capitus and staph epidermidus in separate cx from 11/20 -> likely contaminates.  Repeat cx 11/24 pending.   Nasal cannula oxygen to keep sats greater than 90%.  Patient currently on 2 L of nasal cannula oxygen to keep sats greater than 90%. As above, plan for comfort measures only  Elevated BNP, ? Edema on CXR Getting diuresed.  Echo apparently pending, but as noted above, planning to transition to CMO  Pressure injury:  Wound care consulted.  Pressure Injury 06/23/20 Sacrum Mid Deep Tissue Pressure Injury - Purple or maroon localized area of discolored intact skin or blood-filled blister due to damage of underlying soft tissue from pressure and/or shear. (Active)  06/23/20 0725  Location: Sacrum  Location Orientation: Mid  Staging: Deep Tissue Pressure Injury - Purple or maroon localized area of discolored intact skin or blood-filled blister due to damage of underlying soft tissue from pressure and/or shear.  Wound Description (Comments):   Present on Admission: Yes   Atrial fibrillation with RVR Continue diltiazem 120 mg daily Continue with Eliquis for anticoagulation. Now on CMO  Acute encephalopathy probably secondary to multifocal pneumonia, delirious probably from severe pain due to bone mets. Continue pain management Delirium precautions  Stage IIIb CKD Creatinine appears to be at baseline at this time.  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 Benay Spice for chemotherapy. She has recently completed 3 cycles of cyclophosphamide pentostatin and rituximab. S/p completion of 5 cycles of radiation therapy during her last hospitalization. X-rays of the hips show lytic lesions scattered throughout the pelvis without  any new fractures.  Recommend  PT evaluation.  Pain control with fentanyl patch and oral oxycodone. Appreciate Dr. Bernette Redbird input  Neutropenia and anemia probably secondary to her chemo S/p 1 unit of PRBC transfusion. Repeat hemoglobin around 7  COPD Continue with bronchodilators as needed.  DVT prophylaxis:eliquis Code Status: DNR Family Communication: son over phone Disposition:   Status is: Inpatient  Remains inpatient appropriate because:Inpatient level of care appropriate due to severity of illness   Dispo: The patient is from: Home              Anticipated d/c is to: hospice              Anticipated d/c date is: > 3 days              Patient currently is not medically stable to d/c.  Consultants:   Oncology  Palliative care  Procedures:  none  Antimicrobials: Anti-infectives (From admission, onward)   Start     Dose/Rate Route Frequency Ordered Stop   06/24/20 1000  vancomycin (VANCOREADY) IVPB 1250 mg/250 mL  Status:  Discontinued        1,250 mg 166.7 mL/hr over 90 Minutes Intravenous Every 24 hours 06/23/20 1743 06/26/20 1000   06/24/20 0200  vancomycin (VANCOREADY) IVPB 1250 mg/250 mL  Status:  Discontinued        1,250 mg 166.7 mL/hr over 90 Minutes Intravenous Every 24 hours 06/23/20 0446 06/23/20 1743   06/23/20 1000  ceFEPIme (MAXIPIME) 2 g in sodium chloride 0.9 % 100 mL IVPB  Status:  Discontinued        2 g 200 mL/hr over 30 Minutes Intravenous Every 12 hours 06/23/20 0446 06/26/20 1811   06/23/20 0100  vancomycin (VANCOREADY) IVPB 2000 mg/400 mL        2,000 mg 200 mL/hr over 120 Minutes Intravenous  Once 06/23/20 0050 06/23/20 0329   06/23/20 0045  aztreonam (AZACTAM) 2 g in sodium chloride 0.9 % 100 mL IVPB        2 g 200 mL/hr over 30 Minutes Intravenous  Once 06/23/20 0044 06/23/20 0129   06/23/20 0045  vancomycin (VANCOCIN) IVPB 1000 mg/200 mL premix  Status:  Discontinued        1,000 mg 200 mL/hr over 60 Minutes Intravenous  Once 06/23/20 0044 06/23/20 0050    06/23/20 0045  metroNIDAZOLE (FLAGYL) IVPB 500 mg        500 mg 100 mL/hr over 60 Minutes Intravenous  Once 06/23/20 0044 06/23/20 0206     Subjective: lethargic  Objective: Vitals:   06/26/20 0827 06/27/20 0633 06/27/20 1257 06/27/20 2122  BP: (!) 120/56 (!) 107/52 105/74 (!) 154/96  Pulse: 67 (!) 56 (!) 108 83  Resp: 18 16 14 16   Temp: 97.6 F (36.4 C) 98.3 F (36.8 C) 98.3 F (36.8 C) 99.1 F (37.3 C)  TempSrc: Oral Oral Oral Oral  SpO2: 98% 91% 96% 94%  Weight:      Height:        Intake/Output Summary (Last 24 hours) at 06/28/2020 1032 Last data filed at 06/28/2020 8527 Gross per 24 hour  Intake --  Output 250 ml  Net -250 ml   Filed Weights   06/24/20 0500 06/25/20 0458 06/26/20 0500  Weight: 127 kg 125.3 kg 126.9 kg    Examination:  Limited due to focus comfort General: lethargic Lungs: unlabored Abdomen: Soft, nontender, nondistended Neurological: lethargic  Skin: Warm and dry. No rashes or lesions. Extremities: edema  Data  Reviewed: I have personally reviewed following labs and imaging studies  CBC: Recent Labs  Lab 06/22/20 2333 06/23/20 0442 06/24/20 0405 06/25/20 0400  WBC 5.6 4.5 2.3* 2.9*  NEUTROABS 4.6  --   --  1.0*  HGB 7.7* 6.7* 7.2* 7.2*  HCT 25.7* 20.0* 22.8* 23.1*  MCV 92.8 95.2 93.1 93.5  PLT 608* 386 339 509    Basic Metabolic Panel: Recent Labs  Lab 06/22/20 2333 06/22/20 2355 06/23/20 0442 06/24/20 0405 06/25/20 0400  NA 135  --  138 140 140  K 5.3*  --  4.9 4.6 4.4  CL 105  --  105 106 109  CO2 20*  --  22 23 23   GLUCOSE 108*  --  104* 103* 96  BUN 36*  --  37* 40* 39*  CREATININE 1.39*  --  1.34* 1.33* 1.36*  CALCIUM 8.3*  --  8.0* 8.3* 8.1*  MG  --  2.2  --   --   --     GFR: Estimated Creatinine Clearance: 39.7 mL/min (Xayne Brumbaugh) (by C-G formula based on SCr of 1.36 mg/dL (H)).  Liver Function Tests: Recent Labs  Lab 06/22/20 2333 06/23/20 0442 06/24/20 0405 06/25/20 0400  AST 68* 52* 41 30  ALT 39 35  32 25  ALKPHOS 152* 130* 121 117  BILITOT 2.1* 2.0* 2.4* 1.8*  PROT 7.4 6.5 6.5 6.4*  ALBUMIN 2.5* 2.2* 2.2* 2.0*    CBG: No results for input(s): GLUCAP in the last 168 hours.   Recent Results (from the past 240 hour(s))  Culture, blood (Routine x 2)     Status: Abnormal   Collection Time: 06/22/20 11:33 PM   Specimen: BLOOD  Result Value Ref Range Status   Specimen Description   Final    BLOOD LEFT ANTECUBITAL Performed at [facility] [address], [place], [place] [address]    Special Requests   Final    BOTTLES DRAWN AEROBIC AND ANAEROBIC Blood Culture results may not be optimal due to an inadequate volume of blood received in culture bottles Performed at [facility] [address] [place], [place] [address]    Culture  Setup Time   Final    GRAM POSITIVE COCCI IN CLUSTERS AEROBIC BOTTLE ONLY CRITICAL RESULT CALLED TO, READ BACK BY AND VERIFIED WITH: E. Mariah Milling [id number] 06/24/2020 T. TYSOR    Culture (Ambrosia Wisnewski)  Final    STAPHYLOCOCCUS CAPITIS THE SIGNIFICANCE OF ISOLATING THIS ORGANISM FROM [facility] SINGLE SET OF BLOOD CULTURES WHEN MULTIPLE SETS ARE DRAWN IS UNCERTAIN. PLEASE NOTIFY THE MICROBIOLOGY DEPARTMENT WITHIN ONE WEEK IF SPECIATION AND SENSITIVITIES ARE REQUIRED. Performed at [facility], [place] [address], [place], [place] [address]    Report Status 06/25/2020 FINAL  Final  Culture, blood (Routine x 2)     Status: Abnormal   Collection Time: 06/22/20 11:38 PM   Specimen: BLOOD LEFT HAND  Result Value Ref Range Status   Specimen Description   Final    BLOOD LEFT HAND Performed at [facility] [address] [place], [place] [address]    Special Requests   Final    BOTTLES DRAWN AEROBIC AND ANAEROBIC Blood Culture adequate volume Performed at [facility] [address], [place], [place] [address]    Culture  Setup Time   Final    GRAM POSITIVE COCCI IN CLUSTERS IN BOTH AEROBIC  AND ANAEROBIC BOTTLES CRITICAL RESULT CALLED TO, READ BACK BY AND VERIFIED WITH: E.  JACKSON,PHARMD 0254 06/24/2020 T. TYSOR    Culture (Raykwon Hobbs)  Final    STAPHYLOCOCCUS EPIDERMIDIS THE SIGNIFICANCE OF ISOLATING THIS ORGANISM FROM Rosa Wyly SINGLE SET OF BLOOD CULTURES WHEN MULTIPLE SETS ARE DRAWN IS UNCERTAIN. PLEASE NOTIFY THE MICROBIOLOGY DEPARTMENT WITHIN ONE WEEK IF SPECIATION AND SENSITIVITIES ARE REQUIRED. Performed at [facility], [place] [address], [place], [place] [address]    Report Status 06/26/2020 FINAL  Final  Blood Culture ID Panel (Reflexed)     Status: Abnormal   Collection Time: 06/22/20 11:38 PM  Result Value Ref Range Status   Enterococcus faecalis NOT DETECTED NOT DETECTED Final   Enterococcus Faecium NOT DETECTED NOT DETECTED Final   Listeria monocytogenes NOT DETECTED NOT DETECTED Final   Staphylococcus species DETECTED (Amaranta Mehl) NOT DETECTED Final    Comment: CRITICAL RESULT CALLED TO, READ BACK BY AND VERIFIED WITH: EMariah Milling [id number] 06/24/2020 T. TYSOR    Staphylococcus aureus (BCID) NOT DETECTED NOT DETECTED Final   Staphylococcus epidermidis DETECTED (Abbagale Goguen) NOT DETECTED Final    Comment: Methicillin (oxacillin) resistant coagulase negative staphylococcus. Possible blood culture contaminant (unless isolated from more than one blood culture draw or clinical case suggests pathogenicity). No antibiotic treatment is indicated for blood  culture contaminants. CRITICAL RESULT CALLED TO, READ BACK BY AND VERIFIED WITH: EMariah Milling [id number] 06/24/2020 T. TYSOR    Staphylococcus lugdunensis NOT DETECTED NOT DETECTED Final   Streptococcus species NOT DETECTED NOT DETECTED Final   Streptococcus agalactiae NOT DETECTED NOT DETECTED Final   Streptococcus pneumoniae NOT DETECTED NOT DETECTED Final   Streptococcus pyogenes NOT DETECTED NOT DETECTED Final   Janei Scheff.calcoaceticus-baumannii NOT DETECTED NOT DETECTED Final   Bacteroides fragilis NOT DETECTED NOT DETECTED Final    Enterobacterales NOT DETECTED NOT DETECTED Final   Enterobacter cloacae complex NOT DETECTED NOT DETECTED Final   Escherichia coli NOT DETECTED NOT DETECTED Final   Klebsiella aerogenes NOT DETECTED NOT DETECTED Final   Klebsiella oxytoca NOT DETECTED NOT DETECTED Final   Klebsiella pneumoniae NOT DETECTED NOT DETECTED Final   Proteus species NOT DETECTED NOT DETECTED Final   Salmonella species NOT DETECTED NOT DETECTED Final   Serratia marcescens NOT DETECTED NOT DETECTED Final   Haemophilus influenzae NOT DETECTED NOT DETECTED Final   Neisseria meningitidis NOT DETECTED NOT DETECTED Final   Pseudomonas aeruginosa NOT DETECTED NOT DETECTED Final   Stenotrophomonas maltophilia NOT DETECTED NOT DETECTED Final   Candida albicans NOT DETECTED NOT DETECTED Final   Candida auris NOT DETECTED NOT DETECTED Final   Candida glabrata NOT DETECTED NOT DETECTED Final   Candida krusei NOT DETECTED NOT DETECTED Final   Candida parapsilosis NOT DETECTED NOT DETECTED Final   Candida tropicalis NOT DETECTED NOT DETECTED Final   Cryptococcus neoformans/gattii NOT DETECTED NOT DETECTED Final   Methicillin resistance mecA/C DETECTED (Salvatrice Morandi) NOT DETECTED Final    Comment: CRITICAL RESULT CALLED TO, READ BACK BY AND VERIFIED WITH: Gustavo Lah [phone] 06/24/2020 Mena Goes Performed at [facility], [address] [place], [place] [address]   Urine culture     Status: None   Collection Time: 06/22/20 11:43 PM   Specimen: Urine, Catheterized  Result Value Ref Range Status   Specimen Description   Final    URINE, CATHETERIZED Performed at [facility] [address], [place], [place] [address]    Special Requests   Final    NONE Performed at [facility], [place] [address], [place], [place] [address]    Culture   Final  NO GROWTH Performed at [facility], [place] [address] [place], [place] [address]    Report Status 06/24/2020 FINAL  Final  Resp Panel  by RT-PCR (Flu Jaleil Renwick&B, Covid) Nasopharyngeal Swab     Status: None   Collection Time: 06/23/20 12:30 AM   Specimen: Nasopharyngeal Swab; Nasopharyngeal(NP) swabs in vial transport medium  Result Value Ref Range Status   SARS Coronavirus 2 by RT PCR NEGATIVE NEGATIVE Final    Comment: (NOTE) SARS-CoV-2 target nucleic acids are NOT DETECTED.  The SARS-CoV-2 RNA is generally detectable in upper respiratory specimens during the acute phase of infection. The lowest concentration of SARS-CoV-2 viral copies this assay can detect is 138 copies/mL. Rondy Krupinski negative result does not preclude SARS-Cov-2 infection and should not be used as the sole basis for treatment or other patient management decisions. Channie Bostick negative result may occur with  improper specimen collection/handling, submission of specimen other than nasopharyngeal swab, presence of viral mutation(s) within the areas targeted by this assay, and inadequate number of viral copies(<138 copies/mL). Torrance Frech negative result must be combined with clinical observations, patient history, and epidemiological information. The expected result is Negative.  Fact Sheet for Patients:  EntrepreneurPulse.com.au  Fact Sheet for Healthcare Providers:  IncredibleEmployment.be  This test is no t yet approved or cleared by the [place] FDA and  has been authorized for detection and/or diagnosis of SARS-CoV-2 by FDA under an Emergency Use Authorization (EUA). This EUA will remain  in effect (meaning this test can be used) for the duration of the COVID-19 declaration under Section 564(b)(1) of the Act, 21 U.S.C.section 360bbb-3(b)(1), unless the authorization is terminated  or revoked sooner.       Influenza Makar Slatter by PCR NEGATIVE NEGATIVE Final   Influenza B by PCR NEGATIVE NEGATIVE Final    Comment: (NOTE) The Xpert Xpress SARS-CoV-2/FLU/RSV plus assay is intended as an aid in the diagnosis of influenza from Nasopharyngeal swab  specimens and should not be used as Jaquese Irving sole basis for treatment. Nasal washings and aspirates are unacceptable for Xpert Xpress SARS-CoV-2/FLU/RSV testing.  Fact Sheet for Patients: EntrepreneurPulse.com.au  Fact Sheet for Healthcare Providers: IncredibleEmployment.be  This test is not yet approved or cleared by the [place] FDA and has been authorized for detection and/or diagnosis of SARS-CoV-2 by FDA under an Emergency Use Authorization (EUA). This EUA will remain in effect (meaning this test can be used) for the duration of the COVID-19 declaration under Section 564(b)(1) of the Act, 21 U.S.C. section 360bbb-3(b)(1), unless the authorization is terminated or revoked.  Performed at [facility], [place] [address], [place], [place] [address]   Culture, blood (Routine X 2) w Reflex to ID Panel     Status: None (Preliminary result)   Collection Time: 06/25/20 10:22 AM   Specimen: Site Not Specified; Blood  Result Value Ref Range Status   Specimen Description   Final    SITE NOT SPECIFIED Performed at [facility] [address] [place], [place] [address]    Special Requests   Final    BOTTLES DRAWN AEROBIC ONLY Blood Culture adequate volume Performed at [facility] [address] [place], [place] [address]    Culture   Final    NO GROWTH 2 DAYS Performed at [facility] [address], [place], [place] [address]    Report Status PENDING  Incomplete  Culture, blood (Routine X 2) w Reflex to ID Panel     Status: None (Preliminary  result)   Collection Time: 06/25/20 12:06 PM   Specimen: BLOOD LEFT HAND  Result Value Ref Range Status   Specimen Description   Final    BLOOD LEFT HAND Performed at [facility] [address], [place], [place] [address]    Special Requests   Final    BOTTLES DRAWN AEROBIC ONLY Blood Culture adequate volume Performed at  [facility] [address], [place], [place] [address]    Culture   Final    NO GROWTH 2 DAYS Performed at [facility] [address] [place], [place]  [address]    Report Status PENDING  Incomplete         Radiology Studies: No results found.      Scheduled Meds: . sodium chloride   Intravenous Once  . allopurinol  100 mg Oral Daily  . diltiazem  120 mg Oral Daily  . furosemide  40 mg Intravenous Daily  . mouth rinse  15 mL Mouth Rinse BID  . sodium chloride flush  10-40 mL Intracatheter Q12H  . sodium chloride flush  3 mL Intravenous Q12H   Continuous Infusions: . HYDROmorphone 0.1 mg/hr (06/28/20 0941)     LOS: 5 days    Time spent: over 30 min    Fayrene Helper, MD [facility]   To contact the attending provider between 7A-7P or the covering provider during after hours 7P-7A, please log into the web site [email or web address] and access using universal [place] password for that web site. If you do not have the password, please call the hospital operator.  06/28/2020, 10:32 AM

## 2020-06-28 NOTE — Plan of Care (Signed)
  Problem: Clinical Measurements: Goal: Respiratory complications will improve Outcome: Progressing Goal: Cardiovascular complication will be avoided Outcome: Progressing   Problem: Activity: Goal: Risk for activity intolerance will decrease Outcome: Progressing   Problem: Coping: Goal: Level of anxiety will decrease Outcome: Progressing   Problem: Pain Managment: Goal: General experience of comfort will improve Outcome: Progressing   Problem: Safety: Goal: Ability to remain free from injury will improve Outcome: Progressing   Problem: Skin Integrity: Goal: Risk for impaired skin integrity will decrease Outcome: Progressing

## 2020-06-28 NOTE — Progress Notes (Addendum)
HEMATOLOGY-ONCOLOGY PROGRESS NOTE  SUBJECTIVE: She is lethargic this morning.  Oncology History  Waldenstrom's macroglobulinemia ([place])  04/19/2014 Initial Diagnosis   Waldenstrom's macroglobulinemia ([place])   11/27/2019 - 12/27/2019 Chemotherapy   The patient had palonosetron (ALOXI) injection 0.25 mg, 0.25 mg, Intravenous,  Once, 2 of 4 cycles Administration: 0.25 mg (11/27/2019), 0.25 mg (12/26/2019) bendamustine (BENDEKA) 175 mg in sodium chloride 0.9 % 50 mL (3.0702 mg/mL) chemo infusion, 70 mg/m2 = 175 mg (100 % of original dose 70 mg/m2), Intravenous,  Once, 2 of 4 cycles Dose modification: 70 mg/m2 (original dose 70 mg/m2, Cycle 1, Reason: Provider Judgment) Administration: 175 mg (11/27/2019), 175 mg (11/28/2019), 175 mg (12/26/2019), 175 mg (12/27/2019) riTUXimab-pvvr (RUXIENCE) 900 mg in sodium chloride 0.9 % 250 mL (2.6471 mg/mL) infusion, 375 mg/m2 = 900 mg, Intravenous,  Once, 2 of 4 cycles Administration: 900 mg (11/27/2019), 900 mg (12/26/2019)  for chemotherapy treatment.    05/09/2020 -  Chemotherapy   The patient had palonosetron (ALOXI) injection 0.25 mg, 0.25 mg, Intravenous,  Once, 3 of 4 cycles Administration: 0.25 mg (05/09/2020), 0.25 mg (05/31/2020), 0.25 mg (06/19/2020) pentostatin (NIPENT) 4.8 mg in sodium chloride 0.9 % 50 mL chemo infusion, 2 mg/m2 = 4.8 mg (100 % of original dose 2 mg/m2), Intravenous,  Once, 3 of 4 cycles Dose modification: 2 mg/m2 (original dose 2 mg/m2, Cycle 1, Reason: Provider Judgment) Administration: 4.8 mg (05/09/2020), 4.8 mg (05/31/2020), 4.6 mg (06/19/2020) cyclophosphamide (CYTOXAN) 920 mg in sodium chloride 0.9 % 250 mL chemo infusion, 400 mg/m2 = 920 mg (100 % of original dose 400 mg/m2), Intravenous,  Once, 3 of 4 cycles Dose modification: 400 mg/m2 (original dose 400 mg/m2, Cycle 1, Reason: Provider Judgment) Administration: 920 mg (05/09/2020), 920 mg (05/31/2020), 920 mg (06/19/2020) riTUXimab-pvvr (RUXIENCE) 900 mg in sodium chloride 0.9 %  250 mL (2.6471 mg/mL) infusion, 375 mg/m2 = 900 mg, Intravenous,  Once, 3 of 4 cycles Administration: 900 mg (05/09/2020), 900 mg (05/31/2020), 900 mg (06/19/2020)  for chemotherapy treatment.     PHYSICAL EXAMINATION:  Vitals:   06/27/20 1257 06/27/20 2122  BP: 105/74 (!) 154/96  Pulse: (!) 108 83  Resp: 14 16  Temp: 98.3 F (36.8 C) 99.1 F (37.3 C)  SpO2: 96% 94%   Filed Weights   06/24/20 0500 06/25/20 0458 06/26/20 0500  Weight: 279 lb 15.8 oz (127 kg) 276 lb 3.8 oz (125.3 kg) 279 lb 12.2 oz (126.9 kg)    Intake/Output from previous day: 11/25 0701 - 11/26 0700 In: -  Out: 250 [Urine:250]   HEENT: Pupils are small LYMPH: Palpable 3-4 cm right inguinal lymph node, which is overall decreasing in size   ABDOMEN: abdomen soft NEURO: Lethargic, opens eyes, otherwise not responding MSK: Grimaces with movement at the left hip  LABORATORY DATA:  I have reviewed the data as listed CMP Latest Ref Rng & Units 06/25/2020 06/24/2020 06/23/2020  Glucose 70 - 99 mg/dL 96 103(H) 104(H)  BUN 8 - 23 mg/dL 39(H) 40(H) 37(H)  Creatinine 0.44 - 1.00 mg/dL 1.36(H) 1.33(H) 1.34(H)  Sodium 135 - 145 mmol/L 140 140 138  Potassium 3.5 - 5.1 mmol/L 4.4 4.6 4.9  Chloride 98 - 111 mmol/L 109 106 105  CO2 22 - 32 mmol/L '23 23 22  ' Calcium 8.9 - 10.3 mg/dL 8.1(L) 8.3(L) 8.0(L)  Total Protein 6.5 - 8.1 g/dL 6.4(L) 6.5 6.5  Total Bilirubin 0.3 - 1.2 mg/dL 1.8(H) 2.4(H) 2.0(H)  Alkaline Phos 38 - 126 U/L 117 121 130(H)  AST 15 -  41 U/L 30 41 52(H)  ALT 0 - 44 U/L 25 32 35    Lab Results  Component Value Date   WBC 2.9 (L) 06/25/2020   HGB 7.2 (L) 06/25/2020   HCT 23.1 (L) 06/25/2020   MCV 93.5 06/25/2020   PLT 339 06/25/2020   NEUTROABS 1.0 (L) 06/25/2020    CT Abdomen Pelvis Wo Contrast  Result Date: 06/02/2020 CLINICAL DATA:  Abdominal pain and fever. EXAM: CT ABDOMEN AND PELVIS WITHOUT CONTRAST TECHNIQUE: Multidetector CT imaging of the abdomen and pelvis was performed following  the standard protocol without IV contrast. COMPARISON:  CT dated January 03, 2020 FINDINGS: Lower chest: Multiple airspace opacities are noted at the lung bases bilaterally. There is associated bronchial wall thickening and mucous plugging. The heart size is normal. There are coronary artery calcifications.The heart size is normal. There is a trace left-sided pleural effusion. Hepatobiliary: The liver is normal. There is cholelithiasis with questionable mild gallbladder wall thickening.There is no biliary ductal dilation. Pancreas: Normal contours without ductal dilatation. No peripancreatic fluid collection. Spleen: The spleen is borderline enlarged measuring approximately 12 cm craniocaudad. Adrenals/Urinary Tract: --Adrenal glands: The right adrenal gland is unremarkable. There is a left adrenal myelolipoma that is stable. --Right kidney/ureter: Th there is a stable small exophytic proteinaceous or hemorrhagic cyst arising from the lower pole the right kidney. There is no right-sided hydronephrosis. --Left kidney/ureter: There is no left-sided hydronephrosis. The left kidney is somewhat atrophic with areas of cortical thinning/scarring. --Urinary bladder: Unremarkable. Stomach/Bowel: --Stomach/Duodenum: No hiatal hernia or other gastric abnormality. Normal duodenal course and caliber. --Small bowel: Unremarkable. --Colon: There is scattered colonic diverticula without CT evidence for diverticulitis. There is a large amount of stool throughout the colon. --Appendix: Normal. Vascular/Lymphatic: Atherosclerotic calcification is present within the non-aneurysmal abdominal aorta, without hemodynamically significant stenosis. --No retroperitoneal lymphadenopathy. --No mesenteric lymphadenopathy. --there is a pathologically enlarged right inguinal lymph node measuring approximately 6 by 3.7 cm (axial series 2, image 71). Reproductive: Status post hysterectomy. No adnexal mass. Other: No ascites or free air. The abdominal  wall is normal. Musculoskeletal. There are innumerable lytic lesions throughout the patient's pelvis. The largest is located in the left posterior iliac crest and measures approximately 3.7 cm (axial series 2, image 59). There are small lytic lesions located in the proximal left femur that correspond well with the recent x-ray. There is a stable rounded hyperdense lesion within the marrow cavity of the proximal left femur. This is favored to be benign given its stability. There is a probable nondisplaced zone 1 fracture through the right sacrum (axial series 2, image 54). There is a probable pathologic zone 1 fracture through the left inferior sacrum (axial series 2, image 66). There is transitional vertebral anatomy with 6 lumbar type vertebral bodies. There is an old compression fracture of the L4 vertebral body. There is a new compression fracture of the L5 vertebral body resulting in approximately 50% height loss. There are lytic lesions throughout the visualized ribs. IMPRESSION: 1. Innumerable lytic lesions throughout the pelvic bones and ribs consistent with osseous metastatic disease. 2. Probable non displaced pathologic and nonpathologic fractures of the sacrum bilaterally. 3. Pathologically enlarged 6 cm right inguinal lymph node. This is amenable to percutaneous ultrasound-guided biopsy if clinically indicated. 4. Bibasilar airspace disease concerning for multifocal pneumonia. There is a trace left-sided pleural effusion. 5. Cholelithiasis with mild gallbladder wall thickening. Correlation with ultrasound is recommended. 6. New compression fracture of the L5 vertebral body with approximately 50% height loss.  There is an old L4 compression fracture. There is transitional vertebral anatomy as detailed above. 7. Diverticulosis without CT evidence for diverticulitis. 8. Borderline splenomegaly Aortic Atherosclerosis (ICD10-I70.0). Electronically Signed   By: [name] M.D.   On: 06/02/2020 20:43    DG Chest 1 View  Result Date: 06/11/2020 CLINICAL DATA:  Metastatic disease.  Shortness of breath EXAM: CHEST  1 VIEW COMPARISON:  06/02/2020 FINDINGS: Heart size is stable. There is no pneumothorax. No large pleural effusion. There is no definite focal infiltrate. Expansile left rib lesion is again noted. The pulmonary vasculature appears dilated. There is suggestion of bilateral hilar adenopathy. Aortic calcifications are noted. IMPRESSION: Cardiomegaly with vascular congestion. No definite focal infiltrate. Additional chronic findings as detailed above, likely related to the patient's reported history of metastatic disease. Electronically Signed   By: [name] M.D.   On: 06/11/2020 18:45   CT Head Wo Contrast  Result Date: 06/23/2020 CLINICAL DATA:  Altered mental status. EXAM: CT HEAD WITHOUT CONTRAST TECHNIQUE: Contiguous axial images were obtained from the base of the skull through the vertex without intravenous contrast. COMPARISON:  January 14, 2016 FINDINGS: Brain: There is moderate severity cerebral atrophy with widening of the extra-axial spaces and ventricular dilatation. There are areas of decreased attenuation within the white matter tracts of the supratentorial brain, consistent with microvascular disease changes. Vascular: No hyperdense vessel or unexpected calcification. Skull: Negative for an acute fracture. Multiple lytic lesions are seen scattered throughout the skull. The largest is seen within the right parietal region and measures approximately 2.4 cm x 0.6 cm. This represents a new finding when compared to the prior exam. Sinuses/Orbits: There is marked severity sphenoid sinus, bilateral ethmoid sinus and bilateral maxillary sinus mucosal thickening. Other: None. IMPRESSION: 1. Multiple lytic lesions scattered throughout the skull, concerning for multiple myeloma or metastatic disease. 2. Marked severity pansinus disease. 3. No acute intracranial abnormality. Electronically  Signed   By: Virgina Norfolk M.D.   On: 06/23/2020 02:33   DG Chest Port 1 View  Result Date: 06/22/2020 CLINICAL DATA:  Suspected sepsis, shortness of breath, history of smoking, asthma EXAM: PORTABLE CHEST 1 VIEW COMPARISON:  Radiograph 06/11/2020, CT 03/18/2020 FINDINGS: Increasing patchy opacities most coalescent in the left lung base/retrocardiac space and partially silhouetting the left hemidiaphragm with more diffuse hazy interstitial opacity elsewhere throughout both lungs. No pneumothorax or visible effusion. The aorta is calcified. The remaining cardiomediastinal contours are unremarkable. Redemonstrated expansile lesions in the left third and fourth ribs, similar to comparison CT. No new lytic or blastic lesions are seen. The osseous structures appear diffusely demineralized which may limit detection of small or nondisplaced fractures. Degenerative changes are present in the imaged spine and shoulders. IMPRESSION: Increasing patchy opacities most coalescent in the left lung base/retrocardiac space and partially silhouetting the left hemidiaphragm with more diffuse hazy interstitial opacity elsewhere throughout both lungs. Findings could reflect multifocal pneumonia versus edema. Expansile lytic lesions in the left third and fourth ribs, similar to comparison CT. Electronically Signed   By: Lovena Le M.D.   On: 06/22/2020 23:52   DG Chest Port 1 View  Result Date: 06/02/2020 CLINICAL DATA:  Sepsis. EXAM: PORTABLE CHEST 1 VIEW COMPARISON:  09/13/2019 FINDINGS: Heart size is unremarkable. Aortic calcifications are noted. There is atelectasis at the lung bases. No definite large focal infiltrate. No pneumothorax or pleural effusion. Degenerative changes are noted of both glenohumeral joints. IMPRESSION: No active disease. Electronically Signed   By: Jamie Kato.D.  On: 06/02/2020 16:36   DG Hip Unilat W or Wo Pelvis 2-3 Views Left  Result Date: 06/02/2020 CLINICAL DATA:  Pain  EXAM: DG HIP (WITH OR WITHOUT PELVIS) 2-3V LEFT COMPARISON:  05/15/2020.  CT dated January 03, 2020 FINDINGS: There are degenerative changes of both hips, left worse than right. Evaluation for fractures is limited by patient body habitus. There are increasing lucencies in the proximal left femur when compared to prior studies. IMPRESSION: 1. Evaluation limited by patient body habitus. 2. No definite acute displaced fracture or dislocation. 3. Increasing lucencies in the proximal left femur raises concern for an underlying lesion. Follow-up with a contrast-enhanced MRI is recommended. Electronically Signed   By: [name] M.D.   On: 06/02/2020 16:35   DG HIPS BILAT WITH PELVIS 3-4 VIEWS  Result Date: 06/24/2020 CLINICAL DATA:  Hip pain, no known injury, initial encounter EXAM: DG HIP (WITH OR WITHOUT PELVIS) 3-4V BILAT COMPARISON:  06/02/2020 FINDINGS: Pelvic ring appears intact. There are lytic lesions scattered throughout the pelvis which were better visualized on the most recent CT examination. No definitive pathologic fracture is noted at this time. No femoral fracture is seen. No soft tissue abnormality is noted. IMPRESSION: Lytic lesions within the pelvis similar to that seen on prior CT examination. No definitive fracture noted at this time Electronically Signed   By: Inez Catalina M.D.   On: 06/24/2020 14:47    ASSESSMENT AND PLAN: 1. 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 Treated with multiple systemic therapies including pentostatin/Cytoxan/rituximab in 2010.   Cycle 1 bendamustine/Rituxan 05/31/2014.  Cycle 2 bendamustine/Rituxan 07/02/2014  Cycle 3 bendamustine/rituximab 07/30/2014  Cycle 4 bendamustine/rituximab 09/10/2014  Cycle 5 bendamustine/rituximab 10/22/2014  Cycle 6 bendamustine/rituximab  12/10/2014  PET scan 03/27/2019-diffuse small hypermetabolic lymph nodes in the neck, chest, abdomen, and pelvis, heterogenous hypermetabolism in the bones  CT chest 10/02/2019-bronchiectasis at the lung bases, 13 mm nodular area at the medial left hemidiaphragm, mildly enlarged mediastinal nodes-decreased from June 2020, slightly enlarged left axillary node-9 mm  Rising [name] level March and April 2021  Cycle 1 bendamustine/rituximab 11/27/2019  Cycle 2 bendamustine/Rituxan 12/26/2019  01/22/2020 [name] 4225 (stable)   Cycle 1 cyclophosphamide, pentostatin, rituximab 05/09/2020  Cycle 2 cyclophosphamide, pentostatin, rituximab 05/31/2020  Xray left hip 06/02/2020-no definite acute displaced fracture or disolcation; increasing lucencies in the proximal left femur  CT abdomen and pelvis 06/02/2020-innumerable lytic lesions throughout the pelvic bones and ribs; probable non displaced pathologic and nonpathologic fractures of the sacrum bilaterally; 6 cm right inguinal lymph node; bilateral airspace disease; new compression fracture L5; borderline splenomegaly  Palliative radiation 06/06/2020-06/12/2020  Cycle 3 cyclophosphamide, pentostatin, rituximab 06/19/2020  06/19/2020 [name] 3783 (improved) 2. Left knee arthritis. Followed by Dr. Wynelle Link. 3. Sleep apnea. 4. Anemia. microcytic with peripheral blood smear findings consistent with iron deficiency 07/09/2015, serum iron studies consistent with "anemia of chronic disease ", stool Hemoccult cards negative 07/11/2015 5. Question left parotitis 12/12/2013 presenting with left facial and parotid edema. 6. Admission with pneumonia February 2016 7. History of Neutropenia secondary to chemotherapy, now with moderate neutropenia 8. Left lung pneumonia 04/02/2015, sputum culture positive for Moraxella 06/19/2015 9. Low IgG and IgA 10. CT chest 05/13/2015 with bilateral lower lung consolidation 11. CT chest, abdomen, and pelvis 01/08/2016-new  peribronchial thickening and peripheral tree in bud pattern at the right lower lobe, no  lymphadenopathy 12. Sinus mucosal thickening/opacification on CT 08/16/2015-status post sinus surgery 12/05/2015 13. Sputum culture positive for pseudomonas aeruginosa on 01/10/2016-treated with 10 days of ciprofloxacin 14. 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 Admission with pneumonia 08/26/2016 16. Left lung nodule increased in size on a CT 01/06/2019, not hypermetabolic on the PET scan 08/03/7508, followed by Dr. Elsworth Soho 17.Admission with rectal bleeding 01/02/2020-diverticular? 18. Pain at the right arm and left iliac-likely related to lymphoma, plain x-ray 05/07/2020 at [facility] revealed destructive changes in the right humerus and radius, x-ray of the pelvis 05/15/2020-subtle lucent lesion in the right ilium 19. Hospital admission 06/02/2020 -pneumonia, AKI, anemia 20. Pain secondary to lymphoma involving the pelvis and pelvic fractures-initiation of palliative radiation 06/06/2020 21.  Hospital admission 06/23/2020-sepsis with multifocal pneumonia, acute encephalopathy, A. fib with RVR, leukopenia and anemia secondary to chemotherapy  Ms. Stegall is now maintained on a Dilaudid drip.  I suspect her lethargy is related to sedation from the Dilaudid. I discussed the case with Dr. Florene Glen on 06/26/2020 and again today.  He indicates Ms. Driggs has decided to change to a comfort care approach. Recommendations: 1.  Medical therapy per Dr. Florene Glen and the palliative care service 2.  Consolidate medical regimen for comfort 3.  Please call oncology as needed over the weekend, I will check on her 07/01/2020   LOS: 5 days   [name], AGPCNP-BC 06/28/20   I talked to her daughter at approximately 11:15 AM.  She confirmed the decision of Ms. Keelin for comfort care.  She understands Ms.Villalona may not live beyond a few days.

## 2020-06-28 NOTE — Progress Notes (Signed)
[facility] ([facility]) Hospital Liaison note.    [facility] is unable to offer a room today. Hospital Liaison will follow up tomorrow or sooner if a room becomes available.    Please do not hesitate to call with questions. Thank you for the opportunity to participate in this patient's care.  Chrislyn King, BSN, RN [facility] Hospital Liaison (listed on [facility] under Hospice/[facility])    [phone]     

## 2020-06-28 NOTE — Plan of Care (Signed)
  Problem: Pain Managment: Goal: General experience of comfort will improve Outcome: Progressing   

## 2020-06-29 DIAGNOSIS — J189 Pneumonia, unspecified organism: Secondary | ICD-10-CM | POA: Diagnosis not present

## 2020-06-29 DIAGNOSIS — Z515 Encounter for palliative care: Secondary | ICD-10-CM

## 2020-06-29 DIAGNOSIS — A419 Sepsis, unspecified organism: Secondary | ICD-10-CM | POA: Diagnosis not present

## 2020-06-29 DIAGNOSIS — R52 Pain, unspecified: Secondary | ICD-10-CM

## 2020-06-29 DIAGNOSIS — Z7189 Other specified counseling: Secondary | ICD-10-CM | POA: Diagnosis not present

## 2020-06-29 NOTE — Progress Notes (Signed)
[facility] ([facility]) Hospital Liaison note.     Received request from TOC manager for family interest in [facility]. [facility] is unable to offer a room today. Hospital Liaison will follow up tomorrow or sooner if a room becomes available.     A Please do not hesitate to call with questions.     Thank you,    Mary Anne Robertson, RN, CCM       [facility] Hospital Liaison (listed on [facility] under [facility])     [phone]  

## 2020-06-29 NOTE — Plan of Care (Signed)

## 2020-06-29 NOTE — Progress Notes (Signed)
Daily Progress Note   Patient Name: Natasha Ellis       Date: 06/29/2020 DOB: May 29, 1931  Age: 86 y.o. MRN#: [id number] Attending Physician: Elodia Florence., * Primary Care Physician: Lavone Orn, MD Admit Date: 06/22/2020  Reason for Consultation/Follow-up: Terminal Care  Subjective: Resting in bed.  A close friend, whom she considers as her adopted son has drove in from [place] to visit with her.  He is holding vigil in the room.  Patient is awake alert and comfortable on current hydromorphone settings.  Chart reviewed, medication history reviewed.  Length of Stay: 6  Current Medications: Scheduled Meds:   sodium chloride   Intravenous Once   allopurinol  100 mg Oral Daily   diltiazem  120 mg Oral Daily   mouth rinse  15 mL Mouth Rinse BID   sodium chloride flush  10-40 mL Intracatheter Q12H   sodium chloride flush  3 mL Intravenous Q12H    Continuous Infusions:  HYDROmorphone 0.1 mg/hr (06/28/20 0941)    PRN Meds: acetaminophen **OR** acetaminophen, diclofenac Sodium, haloperidol lactate, HYDROmorphone, ondansetron **OR** ondansetron (ZOFRAN) IV, polyethylene glycol, sodium chloride flush  Physical Exam         Awake and comfortable Regular work of breathing Generalized edema S1-S2 Nonfocal Abdomen mildly distended  Vital Signs: BP 133/70 (BP Location: Left Arm)    Pulse (!) 145    Temp 97.9 F (36.6 C) (Oral)    Resp (!) 22    Ht 5\' 6"  (1.676 m)    Wt 126.9 kg    SpO2 98%    BMI 45.16 kg/m  SpO2: SpO2: 98 % O2 Device: O2 Device: Nasal Cannula O2 Flow Rate: O2 Flow Rate (L/min): 2 L/min  Intake/output summary:   Intake/Output Summary (Last 24 hours) at 06/29/2020 0932 Last data filed at 06/29/2020 0600 Gross per 24 hour  Intake 378.07  ml  Output 700 ml  Net -321.93 ml   LBM: Last BM Date: 06/27/20 Baseline Weight: Weight: 121 kg Most recent weight: Weight: 126.9 kg       Palliative Assessment/Data:      Patient Active Problem List   Diagnosis Date Noted   Pressure injury of skin 06/24/2020   Sepsis due to pneumonia ([place]) 06/23/2020   Acute encephalopathy 06/23/2020   Low grade B cell  lymphoproliferative disorder ([place]) 06/05/2020   Bone metastases ([place]) 06/05/2020   Pneumonia 06/04/2020   Pathological fracture of sacral vertebra due to neoplastic disease 06/02/2020   Rectal bleeding 01/02/2020   PAF (paroxysmal atrial fibrillation) ([place]) 01/02/2020   Goals of care, counseling/discussion 11/14/2019   Chronic kidney disease, stage 3b ([place]) 01/08/2019   AKI (acute kidney injury) ([place]) 01/08/2019   Atrial fibrillation with RVR ([place]) 01/07/2019   Physical deconditioning 09/15/2018   Bronchiectasis ([place]) 09/06/2018   Lung nodule 09/06/2018   Ascending aortic aneurysm ([place]) 09/06/2018   Hypoxia 08/15/2016   Severe obesity (BMI >= 40) ([place]) 07/30/2015   Multifocal pneumonia 06/19/2015   Anemia 06/10/2015   Anemia due to other cause 06/10/2015   OSA (obstructive sleep apnea) 09/20/2014   History of lymphoma 09/20/2014   History of TIA (transient ischemic attack) 09/20/2014   Diarrhea    Pain in gums 08/15/2014   Neutropenia ([facility]) 08/15/2014   Hypersensitivity reaction 05/31/2014   Waldenstrom's macroglobulinemia ([place]) 04/19/2014   HTN (hypertension) 04/29/2013   HLD (hyperlipidemia) 04/29/2013   Lacunar infarction ([place]) 04/29/2013   GERD (gastroesophageal reflux disease) 04/29/2013   TIA (transient ischemic attack) 04/29/2013   Back pain 04/29/2013   Asthma 04/29/2013   Dyspnea 01/25/2013   Chronic cough 01/01/2013    Palliative Care Assessment & Plan   Patient Profile:    Assessment: 87 year old lady, patient of Dr. Benay Spice from medical oncology with  life limiting illness of non-Hodgkin's lymphoma, Walden Strom's macroglobulinemia, also with underlying medical conditions of atrial fibrillation, chronic kidney disease admitted with sepsis due to pneumonia, and has since been seen by palliative medicine team and transition to full scope of comfort measures, currently maintained on hydromorphone low-dose infusion.  Hospice has been contacted and patient is awaiting bed availability at local hospice facility.  Recommendations/Plan:  Chart reviewed, remains at 0.1 mg of low-dose hydromorphone infusion.  Discussed with patient and family member visiting at bedside about judicious use of opioids, discussed about pain and nonpain symptom management and that primary singular focus is on full scope of comfort measures.  Goals of Care and Additional Recommendations:  Limitations on Scope of Treatment: Full Comfort Care  Code Status:    Code Status Orders  (From admission, onward)         Start     Ordered   06/23/20 0431  Do not attempt resuscitation (DNR)  Continuous       Question Answer Comment  In the event of cardiac or respiratory ARREST Do not call a code blue   In the event of cardiac or respiratory ARREST Do not perform Intubation, CPR, defibrillation or ACLS   In the event of cardiac or respiratory ARREST Use medication by any route, position, wound care, and other measures to relive pain and suffering. May use oxygen, suction and manual treatment of airway obstruction as needed for comfort.      06/23/20 0432        Code Status History    Date Active Date Inactive Code Status Order ID Comments User Context   06/02/2020 2134 06/12/2020 1936 DNR [id number]  Etta Quill, DO ED   01/02/2020 2240 01/06/2020 1822 DNR [id number]  Rise Patience, MD ED   01/07/2019 [phone] 01/12/2019 1612 DNR [id number]  Shela Leff, MD Inpatient   01/07/2019 0521 01/07/2019 0609 Full Code [id number]  Shela Leff, MD Inpatient   08/26/2016 2146  08/28/2016 1724 Full Code [id number]  Reubin Milan, MD Inpatient   08/15/2016 2023  08/18/2016 1914 Full Code [id number]  Lily Kocher, MD Inpatient   09/20/2014 0240 09/22/2014 1758 Full Code [id number]  Rise Patience, MD Inpatient   04/29/2013 0103 05/01/2013 2038 Full Code [id number]  Barton Dubois, MD ED   Advance Care Planning Activity    Advance Directive Documentation     Most Recent Value  Type of Advance Directive Living will, Out of facility DNR (pink MOST or yellow form), Healthcare Power of Attorney  Pre-existing out of facility DNR order (yellow form or pink MOST form) Yellow form placed in chart (order not valid for inpatient use)  "MOST" Form in Place? --       Prognosis:   < 2 weeks  Discharge Planning:  Hospice facility When there is a bed available.  [facility] has been consulted and they are following.  Care plan was discussed with patient, family member visiting with her and interdisciplinary team.  Thank you for allowing the Palliative Medicine Team to assist in the care of this patient.   Time In: 9 Time Out: 9.25 Total Time 25 Prolonged Time Billed  no       Greater than 50%  of this time was spent counseling and coordinating care related to the above assessment and plan.  Loistine Chance, MD  Please contact Palliative Medicine Team phone at [phone] for questions and concerns.

## 2020-06-30 DIAGNOSIS — R52 Pain, unspecified: Secondary | ICD-10-CM

## 2020-06-30 DIAGNOSIS — Z515 Encounter for palliative care: Secondary | ICD-10-CM

## 2020-06-30 DIAGNOSIS — Z7189 Other specified counseling: Secondary | ICD-10-CM | POA: Diagnosis not present

## 2020-06-30 DIAGNOSIS — J189 Pneumonia, unspecified organism: Secondary | ICD-10-CM | POA: Diagnosis not present

## 2020-06-30 DIAGNOSIS — A419 Sepsis, unspecified organism: Secondary | ICD-10-CM | POA: Diagnosis not present

## 2020-06-30 LAB — CULTURE, BLOOD (ROUTINE X 2)
Culture: NO GROWTH
Culture: NO GROWTH
Special Requests: ADEQUATE
Special Requests: ADEQUATE

## 2020-06-30 MED ORDER — SODIUM CHLORIDE 0.9 % IV SOLN: 0.2000 mg/h | INTRAVENOUS | 0 refills | Status: DC

## 2020-06-30 NOTE — Progress Notes (Signed)
New bag Dilaudid 50 mg in 0.9% 100 ml hung.  50.2 ml wasted from old bag in [employer].  Waste witnessed by Leanor Rubenstein, RN

## 2020-06-30 NOTE — Progress Notes (Addendum)
Engineer, maintenance [facility]) Hospital Liaison note.   Received request from [facility] for family interest in [facility] with request for transfer tomorrow 11/29 . Chart reviewed and eligibility confirmed. Met and spoke to daughter [name] to confirm interest and explain services.   Family agreeable to transfer tomorrow 11/29. CSW aware.  Registration paperwork will be completed in morning.  Dr. Orpah Melter to assume care per family request.    RN please call report to [phone]. Please arrange transport for patient once consents completed. [facility] hospital liaison will update you once this has been completed.   Thank you,   Clementeen Hoof, BSN, RN   [place] (listed on [facility] under Hospice and [place] [facility] [place])   [phone]

## 2020-06-30 NOTE — Discharge Summary (Addendum)
**Note De-Identified vi Obfusction** Physicin Dischrge Summry  Natasha Ellis MBW:[id number] DOB: 11-29-1930 DOA: 06/22/2020  PCP: Lvone Orn, MD  Admit dte: 06/22/2020 Dischrge dte: 06/30/2020  Time spent: 40 minutes  Recommendtions for Outptient Follow-up:  1. Comfort mesures per inptient hospice provider 2. 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[place])   Anemi   Atril fibrilltion with RVR ([place])   Chronic kidney disese, stge 3b ([place])   Low grde B cell lymphoprolifertive disorder ([place])   Bone metstses ([place])   Acute encephlopthy   Pressure injury of skin   Dischrge Condition: stble  Diet recommendtion: s tolerted  Filed Weights   06/24/20 0500 06/25/20 0458 06/26/20 0500  Weight: 127 kg 125.3 kg 126.9 kg    History of present illness:  Natasha Ellis  86 y.o.femlewith medicl history significnt fornon-Hodgkin's lymphom, Wldenstrm's mcroglobulinemi, tril fibrilltionon Eliquis, chronic kidney disese 3B, nd recent dmission with pneumoni nd AKI, who presented to the emergency deprtment for evlution of fever, cough, nd confusion.She ws dmitted for recurrent for multifocl pneumoni,she ws strted on brod-spectrum IV ntibiotics. Blood cultures grew cog negtive stph, suspicious for contmintion. She ws lso found tohve nemi, with hemoglobin round 6.7,underwent 1 unit of PRBC trnsfusion.  Pllitive cre hs been following.  At this time, pln is now for comfort mesures only bsed on converstions with pllitive cre nd ptient  See below for dditionl detils  Hospitl Course:  Gols of cre: t this point, plnning for comfort  mesures only bsed on converstions between ptient nd pllitive cre on 11/24.  Continue to djust diludid gtt prn (decresed to 0.2 mg/hr due to concern for oversedtion tody on 0.3 mg/hr).   Dischrge to hospice tody   Sepsis with multifocl pneumoni: Chest x-ry showed Incresing ptchy opcities most colescent in the left lung bse/retrocrdic spce nd prtilly silhouetting the left hemidiphrgm with more diffuse hzy interstitil opcity elsewhere throughout both lungs. Findings could reflect multifocl pneumoni versus edem. She ws strted on brod-spectrum IV ntibiotics nd IV Lsix 40 mg dily  Stph cpitus nd stph epidermidus in seprte cx from 11/20 -> likely contmintes.  Repet cx 11/24 pending.   Nsl cnnul oxygen to keep sts greter thn 90%.Ptient currently on 2 L of nsl cnnul oxygen to keep sts greter thn 90%. As bove, pln for comfort mesures only  Elevted BNP, ? Edem on CXR Getting diuresed.  Echo pprently pending, but s noted bove, plnning to trnsition to CMO  Pressure injury:  Wound cre consulted. Pressure Injury 06/23/20 Scrum Mid Deep Tissue Pressure Injury - Purple or mroon loclized re of discolored intct skin or blood-filled blister due to dmge of underlying soft tissue from pressure nd/or sher. (Active)  06/23/20 0725  Loction: Scrum  Loction Orienttion: Mid  Stging: Deep Tissue Pressure Injury - Purple or mroon loclized re of discolored intct skin or blood-filled blister due to dmge of underlying soft tissue from pressure nd/or sher.  Wound Description (Comments):   Present on Admission: Yes   Atril fibrilltion with RVR Continue diltizem 120 mg dily Continue with Eliquis for nticogultion. Now on CMO  Acute encephlopthy probbly secondry to multifocl pneumoni, delirious probbly from severe pin due to bone mets. Continue pin mngement  Delirium precautions  Stage IIIb  CKD Creatinine appears to be at baseline at this time.  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 Benay Spice for chemotherapy. She has recently completed 3 cycles of cyclophosphamide pentostatin and rituximab. S/p completion of 5 cycles of radiation therapy during her last hospitalization. X-rays of the hips show lytic lesions scattered throughout the pelvis without any new fractures. Recommend PT evaluation. Pain control with fentanyl patch and oral oxycodone. Appreciate Dr. Bernette Redbird input  Neutropenia and anemia probably secondary to her chemo S/p 1 unit of PRBC transfusion. Repeat hemoglobin around7  COPD Continue with bronchodilators as needed.  Procedures:  none  Consultations:  Oncology  Palliative care  Discharge Exam: Vitals:   06/29/20 1412 06/30/20 0210  BP: (!) 146/63 123/70  Pulse: 87 60  Resp: (!) 22 19  Temp: 97.7 F (36.5 C) 98.4 F (36.9 C)  SpO2: 95% 97%   No new complaints Zach concerned about sedation Called daughter, [name], discussed discharge plan  Limited exam with comfort measures General: No acute distress, lethargic Lungs: unlabored Neurological: lethargic, but wakes up and does comment appropriately at times based on our conversation  Discharge Instructions   Discharge Instructions    Diet - low sodium heart healthy   Complete by: As directed    Discharge instructions   Complete by: As directed    Continue comfort measures at hospice.   Discharge wound care:   Complete by: As directed    Foam dressing to sacrum, change every 3 days or prn soiling   Increase activity slowly   Complete by: As directed      Allergies as of 06/30/2020      Reactions   Codeine Other (See Comments)   Crazy-nightmares   Amoxicillin Diarrhea      Buspar [buspirone] Other (See Comments)   Unknown -- OFF MAR    Cefdinir Cough   Doxycycline Diarrhea    Erythromycin Other (See Comments)   GI upset   Lisinopril Cough   Morphine And Related Other (See Comments)   Unknown -- of MAR    Myrbetriq [mirabegron Er] Other (See Comments)   Off MAR unknown reaction   Oxybutynin Other (See Comments)   Dry mouth   Sulfa Antibiotics Other (See Comments)   GI upset   Toviaz [fesoterodine Fumarate Er] Other (See Comments)   Unknown off MAR    Tramadol Hcl Other (See Comments)   Unknown reaction --OFF MAR    Vesicare [solifenacin Succinate] Other (See Comments)   Unknown off MAR      Medication List    TAKE these medications   acetaminophen 325 MG tablet Commonly known as: TYLENOL Take 650 mg by mouth every 6 (six) hours as needed for mild pain.   albuterol (2.5 MG/3ML) 0.083% nebulizer solution Commonly known as: PROVENTIL Take 2.5 mg by nebulization in the morning and at bedtime.   albuterol 108 (90 Base) MCG/ACT inhaler Commonly known as: VENTOLIN HFA Inhale 2 puffs into the lungs every 6 (six) hours as needed for wheezing or shortness of breath.   allopurinol 100 MG tablet Commonly known as: ZYLOPRIM Take 1 tablet (100 mg total) by mouth daily.   Breo Ellipta 200-25 MCG/INH Aepb Generic drug: fluticasone furoate-vilanterol Inhale 1 puff into the lungs every evening.   calcium carbonate 500 MG chewable tablet Commonly known as: TUMS - dosed in mg elemental calcium Chew 2 tablets by mouth 3 (three) times daily as needed for indigestion.  calcium-vitamin D 500-200 MG-UNIT tablet Commonly known as: OSCAL WITH D Take 1 tablet by mouth daily with breakfast.   cholestyramine 4 g packet Commonly known as: QUESTRAN Take 4 g by mouth once as needed (for diarrhea). Mix with water or non carbonated drink once as needed for diarrhea.   Delsym 30 MG/5ML liquid Generic drug: dextromethorphan Take 60 mg by mouth every 12 (twelve) hours as needed for cough.   diltiazem 180 MG 24 hr capsule Commonly known as: CARDIZEM CD Take 180 mg by  mouth daily.   docusate sodium 100 MG capsule Commonly known as: COLACE Take 100 mg by mouth daily.   Eliquis 2.5 MG Tabs tablet Generic drug: apixaban Take 2.5 mg by mouth 2 (two) times daily.   esomeprazole 40 MG capsule Commonly known as: NEXIUM Take 40 mg by mouth every evening.   esomeprazole 40 MG capsule Commonly known as: NEXIUM Take 40 mg by mouth daily as needed (acid reflux).   fentaNYL 50 MCG/HR Commonly known as: [place] 1 patch onto the skin every 3 (three) days.   ferrous sulfate 325 (65 FE) MG tablet Take 325 mg by mouth daily.   fluticasone 50 MCG/ACT nasal spray Commonly known as: FLONASE Place 1 spray into both nostrils 2 (two) times daily as needed for rhinitis.   furosemide 40 MG tablet Commonly known as: LASIX Take 40 mg by mouth daily.   gabapentin 100 MG capsule Commonly known as: NEURONTIN Take 100 mg by mouth 3 (three) times daily.   guaiFENesin 600 MG 12 hr tablet Commonly known as: MUCINEX Take 600 mg by mouth 2 (two) times daily.   hydrocortisone-pramoxine 2.5-1 % rectal cream Commonly known as: ANALPRAM-HC Place 1 application rectally daily as needed for hemorrhoids or anal itching.   MiraLax 17 GM/SCOOP powder Generic drug: polyethylene glycol powder Take 17 g by mouth daily as needed (constipation).   naloxone 0.4 MG/ML injection Commonly known as: NARCAN Inject 0.4 mg into the vein once as needed (overdose).   ondansetron 4 MG tablet Commonly known as: ZOFRAN Take 4 mg by mouth every 6 (six) hours as needed for nausea or vomiting.   oxyCODONE 5 MG immediate release tablet Commonly known as: Oxy IR/ROXICODONE Take 10 mg by mouth in the morning and at bedtime.   oxyCODONE 5 MG immediate release tablet Commonly known as: Oxy IR/ROXICODONE Take 5 mg by mouth every 4 (four) hours as needed for severe pain.   potassium chloride 10 MEQ tablet Commonly known as: KLOR-CON Take 10 mEq by mouth daily.   predniSONE 20  MG tablet Commonly known as: DELTASONE Take 20 mg by mouth daily. For two days            Discharge Care Instructions  (From admission, onward)         Start     Ordered   06/30/20 0000  Discharge wound care:       Comments: Foam dressing to sacrum, change every 3 days or prn soiling   06/30/20 1207         Allergies  Allergen Reactions  . Codeine Other (See Comments)    Crazy-nightmares  . Amoxicillin Diarrhea        . Buspar [Buspirone] Other (See Comments)    Unknown -- OFF MAR   . Cefdinir Cough  . Doxycycline Diarrhea  . Erythromycin Other (See Comments)    GI upset  . Lisinopril Cough  . Morphine And Related Other (See Comments)  Unknown -- of MAR   . Myrbetriq Andree Elk Er] Other (See Comments)    Off MAR unknown reaction  . Oxybutynin Other (See Comments)    Dry mouth  . Sulfa Antibiotics Other (See Comments)    GI upset  . Toviaz [Fesoterodine Fumarate Er] Other (See Comments)    Unknown off MAR   . Tramadol Hcl Other (See Comments)    Unknown reaction --OFF MAR   . Vesicare [Solifenacin Succinate] Other (See Comments)    Unknown off MAR      The results of significant diagnostics from this hospitalization (including imaging, microbiology, ancillary and laboratory) are listed below for reference.    Significant Diagnostic Studies: CT Abdomen Pelvis Wo Contrast  Result Date: 06/02/2020 CLINICAL DATA:  Abdominal pain and fever. EXAM: CT ABDOMEN AND PELVIS WITHOUT CONTRAST TECHNIQUE: Multidetector CT imaging of the abdomen and pelvis was performed following the standard protocol without IV contrast. COMPARISON:  CT dated January 03, 2020 FINDINGS: Lower chest: Multiple airspace opacities are noted at the lung bases bilaterally. There is associated bronchial wall thickening and mucous plugging. The heart size is normal. There are coronary artery calcifications.The heart size is normal. There is Clela Hagadorn trace left-sided pleural effusion. Hepatobiliary: The  liver is normal. There is cholelithiasis with questionable mild gallbladder wall thickening.There is no biliary ductal dilation. Pancreas: Normal contours without ductal dilatation. No peripancreatic fluid collection. Spleen: The spleen is borderline enlarged measuring approximately 12 cm craniocaudad. Adrenals/Urinary Tract: --Adrenal glands: The right adrenal gland is unremarkable. There is Lawson Mahone left adrenal myelolipoma that is stable. --Right kidney/ureter: Th there is Marisel Tostenson stable small exophytic proteinaceous or hemorrhagic cyst arising from the lower pole the right kidney. There is no right-sided hydronephrosis. --Left kidney/ureter: There is no left-sided hydronephrosis. The left kidney is somewhat atrophic with areas of cortical thinning/scarring. --Urinary bladder: Unremarkable. Stomach/Bowel: --Stomach/Duodenum: No hiatal hernia or other gastric abnormality. Normal duodenal course and caliber. --Small bowel: Unremarkable. --Colon: There is scattered colonic diverticula without CT evidence for diverticulitis. There is Haward Pope large amount of stool throughout the colon. --Appendix: Normal. Vascular/Lymphatic: Atherosclerotic calcification is present within the non-aneurysmal abdominal aorta, without hemodynamically significant stenosis. --No retroperitoneal lymphadenopathy. --No mesenteric lymphadenopathy. --there is Anhthu Perdew pathologically enlarged right inguinal lymph node measuring approximately 6 by 3.7 cm (axial series 2, image 71). Reproductive: Status post hysterectomy. No adnexal mass. Other: No ascites or free air. The abdominal wall is normal. Musculoskeletal. There are innumerable lytic lesions throughout the patient's pelvis. The largest is located in the left posterior iliac crest and measures approximately 3.7 cm (axial series 2, image 59). There are small lytic lesions located in the proximal left femur that correspond well with the recent x-ray. There is Aristidis Talerico stable rounded hyperdense lesion within the marrow  cavity of the proximal left femur. This is favored to be benign given its stability. There is Graycie Halley probable nondisplaced zone 1 fracture through the right sacrum (axial series 2, image 54). There is Parys Elenbaas probable pathologic zone 1 fracture through the left inferior sacrum (axial series 2, image 66). There is transitional vertebral anatomy with 6 lumbar type vertebral bodies. There is an old compression fracture of the L4 vertebral body. There is Tiyona Desouza new compression fracture of the L5 vertebral body resulting in approximately 50% height loss. There are lytic lesions throughout the visualized ribs. IMPRESSION: 1. Innumerable lytic lesions throughout the pelvic bones and ribs consistent with osseous metastatic disease. 2. Probable non displaced pathologic and nonpathologic fractures of the sacrum bilaterally. 3. Pathologically **Note De-Identified vi Obfusction** enlrged 6 cm right inguinl lymph node. This is menble to percutneous ultrsound-guided biopsy if cliniclly indicted. 4. Bibsilr irspce disese concerning for multifocl pneumoni. There is  trce left-sided pleurl effusion. 5. Cholelithisis with mild gllbldder wll thickening. Correltion with ultrsound is recommended. 6. New compression frcture of the L5 vertebrl body with pproximtely 50% height loss. There is n old L4 compression frcture. There is trnsitionl vertebrl ntomy s detiled bove. 7. Diverticulosis without CT evidence for diverticulitis. 8. Borderline splenomegly Aortic Atherosclerosis (ICD10-I70.0). Electroniclly Signed   By: Constnce Holster M.D.   On: 06/02/2020 20:43   DG Chest 1 View  Result Dte: 06/11/2020 CLINICAL DATA:  Metsttic disese.  Shortness of breth EXAM: CHEST  1 VIEW COMPARISON:  06/02/2020 FINDINGS: Hert size is stble. There is no pneumothorx. No lrge pleurl effusion. There is no definite focl infiltrte. Expnsile left rib lesion is gin noted. The pulmonry vsculture ppers dilted. There is suggestion of bilterl hilr  denopthy. Aortic clcifictions re noted. IMPRESSION: Crdiomegly with vsculr congestion. No definite focl infiltrte. Additionl chronic findings s detiled bove, likely relted to the ptient's reported history of metsttic disese. Electroniclly Signed   By: Constnce Holster M.D.   On: 06/11/2020 18:45   CT Hed Wo Contrst  Result Dte: 06/23/2020 CLINICAL DATA:  Altered mentl sttus. EXAM: CT HEAD WITHOUT CONTRAST TECHNIQUE: Contiguous xil imges were obtined from the bse of the skull through the vertex without intrvenous contrst. COMPARISON:  January 14, 2016 FINDINGS: Brin: There is moderte severity cerebrl trophy with widening of the extr-xil spces nd ventriculr dilttion. There re res of decresed ttenution within the white mtter trcts of the suprtentoril brin, consistent with microvsculr disese chnges. Vsculr: No hyperdense vessel or unexpected clcifiction. Skull: Negtive for n cute frcture. Multiple lytic lesions re seen scttered throughout the skull. The lrgest is seen within the right prietl region nd mesures pproximtely 2.4 cm x 0.6 cm. This represents  new finding when compred to the prior exm. Sinuses/Orbits: There is mrked severity sphenoid sinus, bilterl ethmoid sinus nd bilterl mxillry sinus mucosl thickening. Other: None. IMPRESSION: 1. Multiple lytic lesions scttered throughout the skull, concerning for multiple myelom or metsttic disese. 2. Mrked severity pnsinus disese. 3. No cute intrcrnil bnormlity. Electroniclly Signed   By: Virgin Norfolk M.D.   On: 06/23/2020 02:33   DG Chest Port 1 View  Result Dte: 06/22/2020 CLINICAL DATA:  Suspected sepsis, shortness of breth, history of smoking, sthm EXAM: PORTABLE CHEST 1 VIEW COMPARISON:  Rdiogrph 06/11/2020, CT 03/18/2020 FINDINGS: Incresing ptchy opcities most colescent in the left lung bse/retrocrdic spce nd prtilly silhouetting  the left hemidiphrgm with more diffuse hzy interstitil opcity elsewhere throughout both lungs. No pneumothorx or visible effusion. The ort is clcified. The remining crdiomedistinl contours re unremrkble. Redemonstrted expnsile lesions in the left third nd fourth ribs, similr to comprison CT. No new lytic or blstic lesions re seen. The osseous structures pper diffusely deminerlized which my limit detection of smll or nondisplced frctures. Degenertive chnges re present in the imged spine nd shoulders. IMPRESSION: Incresing ptchy opcities most colescent in the left lung bse/retrocrdic spce nd prtilly silhouetting the left hemidiphrgm with more diffuse hzy interstitil opcity elsewhere throughout both lungs. Findings could reflect multifocl pneumoni versus edem. Expnsile lytic lesions in the left third nd fourth ribs, similr to comprison CT. Electroniclly Signed   By: Loven Le M.D.   On: 06/22/2020 23:52   DG Chest Port 1 View  Result Dte: 06/02/2020 CLINICAL DATA: **Note De-Identified vi Obfusction** Sepsis. EXM: PORTBLE CHEST 1 VIEW COMPRISON:  09/13/2019 FINDINGS: Hert size is unremrkble. ortic clcifictions re noted. There is telectsis t the lung bses. No definite lrge focl infiltrte. No pneumothorx or pleurl effusion. Degenertive chnges re noted of both glenohumerl joints. IMPRESSION: No ctive disese. Electroniclly Signed   By: Constnce Holster M.D.   On: 06/02/2020 16:36   DG Hip Unilt W or Wo Pelvis 2-3 Views Left  Result Dte: 06/02/2020 CLINICL DT:  Pin EXM: DG HIP (WITH OR WITHOUT PELVIS) 2-3V LEFT COMPRISON:  05/15/2020.  CT dted January 03, 2020 FINDINGS: There re degenertive chnges of both hips, left worse thn right. Evlution for frctures is limited by ptient body hbitus. There re incresing lucencies in the proximl left femur when compred to prior studies. IMPRESSION: 1. Evlution limited by ptient body hbitus. 2. No  definite cute displced frcture or disloction. 3. Incresing lucencies in the proximl left femur rises concern for n underlying lesion. Follow-up with  contrst-enhnced MRI is recommended. Electroniclly Signed   By: Constnce Holster M.D.   On: 06/02/2020 16:35   DG HIPS BILT WITH PELVIS 3-4 VIEWS  Result Dte: 06/24/2020 CLINICL DT:  Hip pin, no known injury, initil encounter EXM: DG HIP (WITH OR WITHOUT PELVIS) 3-4V BILT COMPRISON:  06/02/2020 FINDINGS: Pelvic ring ppers intct. There re lytic lesions scttered throughout the pelvis which were better visulized on the most recent CT exmintion. No definitive pthologic frcture is noted t this time. No femorl frcture is seen. No soft tissue bnormlity is noted. IMPRESSION: Lytic lesions within the pelvis similr to tht seen on prior CT exmintion. No definitive frcture noted t this time Electroniclly Signed   By: Inez Ctlin M.D.   On: 06/24/2020 14:47    Microbiology: Recent Results (from the pst 240 hour(s))  Culture, blood (Routine x 2)     Sttus: bnorml   Collection Time: 06/22/20 11:33 PM   Specimen: BLOOD  Result Vlue Ref Rnge Sttus   Specimen Description   Finl    BLOOD LEFT NTECUBITL Performed t [facility] [address], [place], [place] [address]    Specil Requests   Finl    BOTTLES DRWN EROBIC ND NEROBIC Blood Culture results my not be optiml due to n indequte volume of blood received in culture bottles Performed t [facility] [address], [place], [place] [address]    Culture  Setup Time   Finl    GRM POSITIVE COCCI IN CLUSTERS EROBIC BOTTLE ONLY CRITICL RESULT CLLED TO, RED BCK BY ND VERIFIED WITH: E. Mrih Milling [id number] 06/24/2020 T. TYSOR    Culture ()  Finl    STPHYLOCOCCUS CPITIS THE SIGNIFICNCE OF ISOLTING THIS ORGNISM FROM  SINGLE SET OF BLOOD CULTURES WHEN MULTIPLE SETS RE DRWN IS UNCERTIN.  PLESE NOTIFY THE MICROBIOLOGY DEPRTMENT WITHIN ONE WEEK IF SPECITION ND SENSITIVITIES RE REQUIRED. Performed t [facility], [place] [address], [place], [place] [address]    Report Sttus 06/25/2020 FINL  Finl  Culture, blood (Routine x 2)     Sttus: bnorml   Collection Time: 06/22/20 11:38 PM   Specimen: BLOOD LEFT HND  Result Vlue Ref Rnge Sttus   Specimen Description   Finl    BLOOD LEFT HND Performed t [facility] [address], [place],  [address]    Specil Requests   Finl    BOTTLES DRWN EROBIC ND NEROBIC Blood Culture dequte volume Performed t [facility], [place] [address], [place], [place] [address]    Culture  Setup Time   Final    GRAM POSITIVE COCCI IN CLUSTERS IN BOTH AEROBIC AND ANAEROBIC BOTTLES CRITICAL RESULT CALLED TO, READ BACK BY AND VERIFIED WITH: E. Mariah Milling [id number] 06/24/2020 T. TYSOR    Culture (Ninel Abdella)  Final    STAPHYLOCOCCUS EPIDERMIDIS THE SIGNIFICANCE OF ISOLATING THIS ORGANISM FROM [place] SINGLE SET OF BLOOD CULTURES WHEN MULTIPLE SETS ARE DRAWN IS UNCERTAIN. PLEASE NOTIFY THE MICROBIOLOGY DEPARTMENT WITHIN ONE WEEK IF SPECIATION AND SENSITIVITIES ARE REQUIRED. Performed at [facility], [place] [address] [place], [place] [address]    Report Status 06/26/2020 FINAL  Final  Blood Culture ID Panel (Reflexed)     Status: Abnormal   Collection Time: 06/22/20 11:38 PM  Result Value Ref Range Status   Enterococcus faecalis NOT DETECTED NOT DETECTED Final   Enterococcus Faecium NOT DETECTED NOT DETECTED Final   Listeria monocytogenes NOT DETECTED NOT DETECTED Final   Staphylococcus species DETECTED (Izamar Linden) NOT DETECTED Final    Comment: CRITICAL RESULT CALLED TO, READ BACK BY AND VERIFIED WITH: EMariah Milling [id number] 06/24/2020 T. TYSOR    Staphylococcus aureus (BCID) NOT DETECTED NOT DETECTED Final   Staphylococcus epidermidis DETECTED (Makyia Erxleben) NOT DETECTED Final    Comment: Methicillin  (oxacillin) resistant coagulase negative staphylococcus. Possible blood culture contaminant (unless isolated from more than one blood culture draw or clinical case suggests pathogenicity). No antibiotic treatment is indicated for blood  culture contaminants. CRITICAL RESULT CALLED TO, READ BACK BY AND VERIFIED WITH: EMariah Milling [id number] 06/24/2020 T. TYSOR    Staphylococcus lugdunensis NOT DETECTED NOT DETECTED Final   Streptococcus species NOT DETECTED NOT DETECTED Final   Streptococcus agalactiae NOT DETECTED NOT DETECTED Final   Streptococcus pneumoniae NOT DETECTED NOT DETECTED Final   Streptococcus pyogenes NOT DETECTED NOT DETECTED Final   Anabel Lykins.calcoaceticus-baumannii NOT DETECTED NOT DETECTED Final   Bacteroides fragilis NOT DETECTED NOT DETECTED Final   Enterobacterales NOT DETECTED NOT DETECTED Final   Enterobacter cloacae complex NOT DETECTED NOT DETECTED Final   Escherichia coli NOT DETECTED NOT DETECTED Final   Klebsiella aerogenes NOT DETECTED NOT DETECTED Final   Klebsiella oxytoca NOT DETECTED NOT DETECTED Final   Klebsiella pneumoniae NOT DETECTED NOT DETECTED Final   Proteus species NOT DETECTED NOT DETECTED Final   Salmonella species NOT DETECTED NOT DETECTED Final   Serratia marcescens NOT DETECTED NOT DETECTED Final   Haemophilus influenzae NOT DETECTED NOT DETECTED Final   Neisseria meningitidis NOT DETECTED NOT DETECTED Final   Pseudomonas aeruginosa NOT DETECTED NOT DETECTED Final   Stenotrophomonas maltophilia NOT DETECTED NOT DETECTED Final   Candida albicans NOT DETECTED NOT DETECTED Final   Candida auris NOT DETECTED NOT DETECTED Final   Candida glabrata NOT DETECTED NOT DETECTED Final   Candida krusei NOT DETECTED NOT DETECTED Final   Candida parapsilosis NOT DETECTED NOT DETECTED Final   Candida tropicalis NOT DETECTED NOT DETECTED Final   Cryptococcus neoformans/gattii NOT DETECTED NOT DETECTED Final   Methicillin resistance mecA/C DETECTED (Abundio Teuscher) NOT  DETECTED Final    Comment: CRITICAL RESULT CALLED TO, READ BACK BY AND VERIFIED WITH: Gustavo Lah [id number] 06/24/2020 Mena Goes Performed at [facility], [address], [place], [place] [address]   Urine culture     Status: None   Collection Time: 06/22/20 11:43 PM   Specimen: Urine, Catheterized  Result Value Ref Range Status   Specimen Description   Final    URINE, CATHETERIZED Performed  at [facility], [place] [address], [place], [place] [address]    Special Requests   Final    NONE Performed at [facility], [place] [address] [place], [place] [address]    Culture   Final    NO GROWTH Performed at [facility], [place] [address] [place], [place] [address]    Report Status 06/24/2020 FINAL  Final  Resp Panel by RT-PCR (Flu Kaidence Callaway&B, Covid) Nasopharyngeal Swab     Status: None   Collection Time: 06/23/20 12:30 AM   Specimen: Nasopharyngeal Swab; Nasopharyngeal(NP) swabs in vial transport medium  Result Value Ref Range Status   SARS Coronavirus 2 by RT PCR NEGATIVE NEGATIVE Final    Comment: (NOTE) SARS-CoV-2 target nucleic acids are NOT DETECTED.  The SARS-CoV-2 RNA is generally detectable in upper respiratory specimens during the acute phase of infection. The lowest concentration of SARS-CoV-2 viral copies this assay can detect is 138 copies/mL. Filemon Breton negative result does not preclude SARS-Cov-2 infection and should not be used as the sole basis for treatment or other patient management decisions. Jasmeet Manton negative result may occur with  improper specimen collection/handling, submission of specimen other than nasopharyngeal swab, presence of viral mutation(s) within the areas targeted by this assay, and inadequate number of viral copies(<138 copies/mL). Dorota Heinrichs negative result must be combined with clinical observations, patient history, and epidemiological information. The expected result is Negative.  Fact Sheet for Patients:   EntrepreneurPulse.com.au  Fact Sheet for Healthcare Providers:  IncredibleEmployment.be  This test is no t yet approved or cleared by the [place] FDA and  has been authorized for detection and/or diagnosis of SARS-CoV-2 by FDA under an Emergency Use Authorization (EUA). This EUA will remain  in effect (meaning this test can be used) for the duration of the COVID-19 declaration under Section 564(b)(1) of the Act, 21 U.S.C.section 360bbb-3(b)(1), unless the authorization is terminated  or revoked sooner.       Influenza Hanako Tipping by PCR NEGATIVE NEGATIVE Final   Influenza B by PCR NEGATIVE NEGATIVE Final    Comment: (NOTE) The Xpert Xpress SARS-CoV-2/FLU/RSV plus assay is intended as an aid in the diagnosis of influenza from Nasopharyngeal swab specimens and should not be used as Aanshi Batchelder sole basis for treatment. Nasal washings and aspirates are unacceptable for Xpert Xpress SARS-CoV-2/FLU/RSV testing.  Fact Sheet for Patients: EntrepreneurPulse.com.au  Fact Sheet for Healthcare Providers: IncredibleEmployment.be  This test is not yet approved or cleared by the [place] FDA and has been authorized for detection and/or diagnosis of SARS-CoV-2 by FDA under an Emergency Use Authorization (EUA). This EUA will remain in effect (meaning this test can be used) for the duration of the COVID-19 declaration under Section 564(b)(1) of the Act, 21 U.S.C. section 360bbb-3(b)(1), unless the authorization is terminated or revoked.  Performed at [facility], [place] [address], [place], [place] [address]   Culture, blood (Routine X 2) w Reflex to ID Panel     Status: None   Collection Time: 06/25/20 10:22 AM   Specimen: Site Not Specified; Blood  Result Value Ref Range Status   Specimen Description   Final    SITE NOT SPECIFIED Performed at [place] [address],  [place], [place] [address]    Special Requests   Final    BOTTLES DRAWN AEROBIC ONLY Blood Culture adequate volume Performed at [place] [address], [place], [place] [address]    Culture   Final  NO GROWTH 5 DAYS Performed at [facility], [place] [address] [place], [place] [address]    Report Status 06/30/2020 FINAL  Final  Culture, blood (Routine X 2) w Reflex to ID Panel     Status: None   Collection Time: 06/25/20 12:06 PM   Specimen: BLOOD LEFT HAND  Result Value Ref Range Status   Specimen Description   Final    BLOOD LEFT HAND Performed at [place] [address] [place], [place] [address]    Special Requests   Final    BOTTLES DRAWN AEROBIC ONLY Blood Culture adequate volume Performed at [place] [address], [place], [place] [address]    Culture   Final    NO GROWTH 5 DAYS Performed at [facility], [place] [address], [place], [place] [address]    Report Status 06/30/2020 FINAL  Final     Labs: Basic Metabolic Panel: Recent Labs  Lab 06/24/20 0405 06/25/20 0400  NA 140 140  K 4.6 4.4  CL 106 109  CO2 23 23  GLUCOSE 103* 96  BUN 40* 39*  CREATININE 1.33* 1.36*  CALCIUM 8.3* 8.1*   Liver Function Tests: Recent Labs  Lab 06/24/20 0405 06/25/20 0400  AST 41 30  ALT 32 25  ALKPHOS 121 117  BILITOT 2.4* 1.8*  PROT 6.5 6.4*  ALBUMIN 2.2* 2.0*   No results for input(s): LIPASE, AMYLASE in the last 168 hours. No results for input(s): AMMONIA in the last 168 hours. CBC: Recent Labs  Lab 06/24/20 0405 06/25/20 0400  WBC 2.3* 2.9*  NEUTROABS  --  1.0*  HGB 7.2* 7.2*  HCT 22.8* 23.1*  MCV 93.1 93.5  PLT 339 339   Cardiac Enzymes: No results for input(s): CKTOTAL, CKMB, CKMBINDEX, TROPONINI in the last 168 hours. BNP: BNP (last 3 results) Recent Labs    06/23/20 0448  BNP 432.6*    ProBNP (last 3 results) Recent Labs    09/13/19 0938  PROBNP 133.0*     CBG: No results for input(s): GLUCAP in the last 168 hours.     Signed:  Fayrene Helper MD.  [facility] 06/30/2020, 12:24 PM

## 2020-06-30 NOTE — TOC Progression Note (Signed)
Transition of Care [facility]) - Progression Note    Patient Details  Name: Natasha Ellis MRN: [id number] Date of Birth: [birth year]  Transition of Care [facility]) CM/SW [place], [employer] Phone Number: [phone] 06/30/2020, 2:40 PM  Clinical Narrative:     Patient will d/c to [facility] place, pending family concerns over medication. Melissa w/ [facility] will update CSW when patient is ready for transport.       Expected Discharge Plan and Services           Expected Discharge Date: 06/30/20                                     Social Determinants of Health (SDOH) Interventions    Readmission Risk Interventions Readmission Risk Prevention Plan 06/12/2020 01/09/2019 01/09/2019  Transportation Screening Complete Complete Complete  PCP or Specialist Appt within 3-5 Days Complete Not Complete Not Complete  Not Complete comments - not ready for d/c -  [place] or Home Care Consult Complete Complete -  Social Work Consult for [place] Planning/Counseling Complete Complete -  Palliative Care Screening Not Applicable Not Applicable -  Medication Review Press photographer) Complete Complete -  Some recent data might be hidden

## 2020-06-30 NOTE — Progress Notes (Signed)
PROGRESS NOTE    Natasha Ellis  LHT:[id number] DOB: 19-Jun-1931 DOA: 06/22/2020 PCP: Lavone Orn, MD   Chief Complaint  Patient presents with  . Blood Infection   Brief Narrative:  Natasha Ellis Natasha Ellis 86 y.o.femalewith medical history significant fornon-Hodgkin's lymphoma, Waldenstrm's macroglobulinemia, atrial fibrillation on Eliquis, chronic kidney disease 3B, and recent admission with pneumonia and AKI, who presented to the emergency department for evaluation of fever, cough, and confusion.She was admitted for recurrent for multifocal pneumonia, she was started on broad-spectrum IV antibiotics.  Blood cultures grew coag negative staph, suspicious for contamination.  She was also found to have anemia, with Natasha Ellis  hemoglobin around 6.7, underwent 1 unit of PRBC transfusion.  Palliative care has been following.  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  Fentanyl patch d/c'd yesterday. She asks for increase in pain meds today, will discuss with Dr. Rowe Pavy.  Continue dilaudid gtt. Awaiting inpatient hospice bed  Sepsis with multifocal pneumonia: Chest x-ray showed Increasing patchy opacities most coalescent in the left lung base/retrocardiac space and partially silhouetting the left hemidiaphragm with more diffuse hazy interstitial opacity elsewhere throughout both lungs. Findings could reflect multifocal pneumonia versus edema. She was started on  broad-spectrum IV antibiotics and IV Lasix 40 mg daily  Staph capitus and staph epidermidus in separate cx from 11/20 -> likely contaminates.  Repeat cx 11/24 pending.   Nasal cannula oxygen to keep sats greater than 90%.  Patient currently on 2 L of nasal cannula oxygen to keep sats greater than 90%. As above, plan for comfort measures only  Elevated BNP, ? Edema on CXR Getting diuresed.  Echo apparently pending, but as noted above, planning to transition to CMO  Pressure injury:  Wound care consulted.  Pressure Injury 06/23/20 Sacrum Mid Deep Tissue Pressure Injury - Purple or maroon localized area of discolored intact skin or blood-filled blister due to damage of underlying soft tissue from pressure and/or shear. (Active)  06/23/20 0725  Location: Sacrum  Location Orientation: Mid  Staging: Deep Tissue Pressure Injury - Purple or maroon localized area of discolored intact skin or blood-filled blister due to damage of underlying soft tissue from pressure and/or shear.  Wound Description (Comments):   Present on Admission: Yes   Atrial fibrillation with RVR Continue diltiazem 120 mg daily Continue with Eliquis for anticoagulation. Now on CMO  Acute encephalopathy probably secondary to multifocal pneumonia, delirious probably from severe pain due to bone mets. Continue pain management Delirium precautions  Stage IIIb CKD Creatinine appears to be at baseline at this time.  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 Benay Spice for chemotherapy. She has recently completed 3 cycles of cyclophosphamide pentostatin and rituximab. S/p completion of 5 cycles of radiation therapy during her last hospitalization. X-rays of the hips show lytic lesions scattered throughout the pelvis without any new fractures.  Recommend PT evaluation.  Pain control with fentanyl patch and  oral oxycodone. Appreciate Dr.  Bernette Redbird input  Neutropenia and anemia probably secondary to her chemo S/p 1 unit of PRBC transfusion. Repeat hemoglobin around 7  COPD Continue with bronchodilators as needed.  DVT prophylaxis:eliquis Code Status: DNR Family Communication: Zach at bedside Disposition:   Status is: Inpatient  Remains inpatient appropriate because:Inpatient level of care appropriate due to severity of illness   Dispo: The patient is from: Home              Anticipated d/c is to: hospice              Anticipated d/c date is: > 3 days              Patient currently is not medically stable to d/c.  Consultants:   Oncology  Palliative care  Procedures:  none  Antimicrobials: Anti-infectives (From admission, onward)   Start     Dose/Rate Route Frequency Ordered Stop   06/24/20 1000  vancomycin (VANCOREADY) IVPB 1250 mg/250 mL  Status:  Discontinued        1,250 mg 166.7 mL/hr over 90 Minutes Intravenous Every 24 hours 06/23/20 1743 06/26/20 1000   06/24/20 0200  vancomycin (VANCOREADY) IVPB 1250 mg/250 mL  Status:  Discontinued        1,250 mg 166.7 mL/hr over 90 Minutes Intravenous Every 24 hours 06/23/20 0446 06/23/20 1743   06/23/20 1000  ceFEPIme (MAXIPIME) 2 g in sodium chloride 0.9 % 100 mL IVPB  Status:  Discontinued        2 g 200 mL/hr over 30 Minutes Intravenous Every 12 hours 06/23/20 0446 06/26/20 1811   06/23/20 0100  vancomycin (VANCOREADY) IVPB 2000 mg/400 mL        2,000 mg 200 mL/hr over 120 Minutes Intravenous  Once 06/23/20 0050 06/23/20 0329   06/23/20 0045  aztreonam (AZACTAM) 2 g in sodium chloride 0.9 % 100 mL IVPB        2 g 200 mL/hr over 30 Minutes Intravenous  Once 06/23/20 0044 06/23/20 0129   06/23/20 0045  vancomycin (VANCOCIN) IVPB 1000 mg/200 mL premix  Status:  Discontinued        1,000 mg 200 mL/hr over 60 Minutes Intravenous  Once 06/23/20 0044 06/23/20 0050   06/23/20 0045  metroNIDAZOLE (FLAGYL) IVPB 500 mg        500 mg 100 mL/hr over 60  Minutes Intravenous  Once 06/23/20 0044 06/23/20 0206     Subjective: Asks for more pain meds  Objective: Vitals:   06/28/20 1348 06/29/20 0351 06/29/20 1412 06/30/20 0210  BP: (!) 133/58 133/70 (!) 146/63 123/70  Pulse: 87 (!) 145 87 60  Resp: 16 (!) 22 (!) 22 19  Temp: 99 F (37.2 C) 97.9 F (36.6 C) 97.7 F (36.5 C) 98.4 F (36.9 C)  TempSrc: Axillary Oral Oral Oral  SpO2: 98% 98% 95% 97%  Weight:      Height:        Intake/Output Summary (Last 24 hours) at 06/30/2020 1226 Last data filed at 06/30/2020 0900 Gross per 24 hour  Intake 133.77 ml  Output 500 ml  Net -366.23 ml   Filed Weights   06/24/20 0500 06/25/20 0458 06/26/20 0500  Weight: 127 kg 125.3 kg 126.9 kg    Examination:  Limited due to focus comfort General: nad Lungs: unlabored Abdomen: s/nt/nd Neurological: alert and conversant Skin: warm and dry  Data Reviewed: I have personally reviewed following labs and imaging studies  CBC: Recent Labs  Lab  06/24/20 0405 06/25/20 0400  WBC 2.3* 2.9*  NEUTROABS  --  1.0*  HGB 7.2* 7.2*  HCT 22.8* 23.1*  MCV 93.1 93.5  PLT 339 875    Basic Metabolic Panel: Recent Labs  Lab 06/24/20 0405 06/25/20 0400  NA 140 140  K 4.6 4.4  CL 106 109  CO2 23 23  GLUCOSE 103* 96  BUN 40* 39*  CREATININE 1.33* 1.36*  CALCIUM 8.3* 8.1*    GFR: Estimated Creatinine Clearance: 39.7 mL/min (Willeen Novak) (by C-G formula based on SCr of 1.36 mg/dL (H)).  Liver Function Tests: Recent Labs  Lab 06/24/20 0405 06/25/20 0400  AST 41 30  ALT 32 25  ALKPHOS 121 117  BILITOT 2.4* 1.8*  PROT 6.5 6.4*  ALBUMIN 2.2* 2.0*    CBG: No results for input(s): GLUCAP in the last 168 hours.   Recent Results (from the past 240 hour(s))  Culture, blood (Routine x 2)     Status: Abnormal   Collection Time: 06/22/20 11:33 PM   Specimen: BLOOD  Result Value Ref Range Status   Specimen Description   Final    BLOOD LEFT ANTECUBITAL Performed at [facility] [address], [place], [place] [address]    Special Requests   Final    BOTTLES DRAWN AEROBIC AND ANAEROBIC Blood Culture results may not be optimal due to an inadequate volume of blood received in culture bottles Performed at [facility] [address] [place], [place] [address]    Culture  Setup Time   Final    GRAM POSITIVE COCCI IN CLUSTERS AEROBIC BOTTLE ONLY CRITICAL RESULT CALLED TO, READ BACK BY AND VERIFIED WITH: Natasha Ellis [id number] 06/24/2020 T. TYSOR    Culture (Elsa Ploch)  Final    STAPHYLOCOCCUS CAPITIS THE SIGNIFICANCE OF ISOLATING THIS ORGANISM FROM Natasha Ellis SINGLE SET OF BLOOD CULTURES WHEN MULTIPLE SETS ARE DRAWN IS UNCERTAIN. PLEASE NOTIFY THE MICROBIOLOGY DEPARTMENT WITHIN ONE WEEK IF SPECIATION AND SENSITIVITIES ARE REQUIRED. Performed at [facility], [place] [address] [place], [place] [address]    Report Status 06/25/2020 FINAL  Final  Culture, blood (Routine x 2)     Status: Abnormal   Collection Time: 06/22/20 11:38 PM   Specimen: BLOOD LEFT HAND  Result Value Ref Range Status   Specimen Description   Final    BLOOD LEFT HAND Performed at [facility] [address] [place], [place] [address]    Special Requests   Final    BOTTLES DRAWN AEROBIC AND ANAEROBIC Blood Culture adequate volume Performed at [facility] [address], [place], [place] [address]    Culture  Setup Time   Final    GRAM POSITIVE COCCI IN CLUSTERS IN BOTH AEROBIC AND ANAEROBIC BOTTLES CRITICAL RESULT CALLED TO, READ BACK BY AND VERIFIED WITH: Natasha Ellis [id number] 06/24/2020 T. TYSOR    Culture (Mourad Cwikla)  Final    STAPHYLOCOCCUS EPIDERMIDIS THE SIGNIFICANCE OF ISOLATING THIS ORGANISM FROM Natasha Ellis SINGLE SET OF BLOOD CULTURES WHEN MULTIPLE SETS ARE DRAWN IS UNCERTAIN. PLEASE NOTIFY THE MICROBIOLOGY DEPARTMENT WITHIN ONE WEEK IF SPECIATION AND SENSITIVITIES ARE REQUIRED. Performed at [facility], [place] [address],  [place] [employer], [place] [address]    Report Status 06/26/2020 FINAL  Final  Blood Culture ID Panel (Reflexed)     Status: Abnormal   Collection Time: 06/22/20 11:38 PM  Result Value Ref Range Status   Enterococcus faecalis NOT DETECTED NOT DETECTED Final   Enterococcus Faecium  NOT DETECTED NOT DETECTED Final   Listeria monocytogenes NOT DETECTED NOT DETECTED Final   Staphylococcus species DETECTED (Jeri Rawlins) NOT DETECTED Final    Comment: CRITICAL RESULT CALLED TO, READ BACK BY AND VERIFIED WITH: EMariah Ellis [id number] 06/24/2020 T. TYSOR    Staphylococcus aureus (BCID) NOT DETECTED NOT DETECTED Final   Staphylococcus epidermidis DETECTED (Anterrio Mccleery) NOT DETECTED Final    Comment: Methicillin (oxacillin) resistant coagulase negative staphylococcus. Possible blood culture contaminant (unless isolated from more than one blood culture draw or clinical case suggests pathogenicity). No antibiotic treatment is indicated for blood  culture contaminants. CRITICAL RESULT CALLED TO, READ BACK BY AND VERIFIED WITH: EMariah Ellis [id number] 06/24/2020 T. TYSOR    Staphylococcus lugdunensis NOT DETECTED NOT DETECTED Final   Streptococcus species NOT DETECTED NOT DETECTED Final   Streptococcus agalactiae NOT DETECTED NOT DETECTED Final   Streptococcus pneumoniae NOT DETECTED NOT DETECTED Final   Streptococcus pyogenes NOT DETECTED NOT DETECTED Final   Katricia Prehn.calcoaceticus-baumannii NOT DETECTED NOT DETECTED Final   Bacteroides fragilis NOT DETECTED NOT DETECTED Final   Enterobacterales NOT DETECTED NOT DETECTED Final   Enterobacter cloacae complex NOT DETECTED NOT DETECTED Final   Escherichia coli NOT DETECTED NOT DETECTED Final   Klebsiella aerogenes NOT DETECTED NOT DETECTED Final   Klebsiella oxytoca NOT DETECTED NOT DETECTED Final   Klebsiella pneumoniae NOT DETECTED NOT DETECTED Final   Proteus species NOT DETECTED NOT DETECTED Final   Salmonella species NOT DETECTED NOT DETECTED Final   Serratia marcescens NOT DETECTED NOT  DETECTED Final   Haemophilus influenzae NOT DETECTED NOT DETECTED Final   Neisseria meningitidis NOT DETECTED NOT DETECTED Final   Pseudomonas aeruginosa NOT DETECTED NOT DETECTED Final   Stenotrophomonas maltophilia NOT DETECTED NOT DETECTED Final   Candida albicans NOT DETECTED NOT DETECTED Final   Candida auris NOT DETECTED NOT DETECTED Final   Candida glabrata NOT DETECTED NOT DETECTED Final   Candida krusei NOT DETECTED NOT DETECTED Final   Candida parapsilosis NOT DETECTED NOT DETECTED Final   Candida tropicalis NOT DETECTED NOT DETECTED Final   Cryptococcus neoformans/gattii NOT DETECTED NOT DETECTED Final   Methicillin resistance mecA/C DETECTED (Ellanore Vanhook) NOT DETECTED Final    Comment: CRITICAL RESULT CALLED TO, READ BACK BY AND VERIFIED WITH: Gustavo Lah [id number] 06/24/2020 Mena Goes Performed at [facility], [address], [place], [place] [address]   Urine culture     Status: None   Collection Time: 06/22/20 11:43 PM   Specimen: Urine, Catheterized  Result Value Ref Range Status   Specimen Description   Final    URINE, CATHETERIZED Performed at [facility] [address], [place], [place] [address]    Special Requests   Final    NONE Performed at [facility], [place] [address] [place], [place] [address]    Culture   Final    NO GROWTH Performed at [facility], [place] [address], [place],  [address]    Report Status 06/24/2020 FINAL  Final  Resp Panel by RT-PCR (Flu Lasalle Abee&B, Covid) Nasopharyngeal Swab     Status: None   Collection Time: 06/23/20 12:30 AM   Specimen: Nasopharyngeal Swab; Nasopharyngeal(NP) swabs in vial transport medium  Result Value Ref Range Status   SARS Coronavirus 2 by RT PCR NEGATIVE NEGATIVE Final    Comment: (NOTE) SARS-CoV-2 target nucleic acids are NOT DETECTED.  The SARS-CoV-2 RNA is generally detectable in upper respiratory specimens during the acute phase of infection. The  lowest concentration  of SARS-CoV-2 viral copies this assay can detect is 138 copies/mL. Khan Chura negative result does not preclude SARS-Cov-2 infection and should not be used as the sole basis for treatment or other patient management decisions. Shatavia Santor negative result may occur with  improper specimen collection/handling, submission of specimen other than nasopharyngeal swab, presence of viral mutation(s) within the areas targeted by this assay, and inadequate number of viral copies(<138 copies/mL). Ashleymarie Granderson negative result must be combined with clinical observations, patient history, and epidemiological information. The expected result is Negative.  Fact Sheet for Patients:  EntrepreneurPulse.com.au  Fact Sheet for Healthcare Providers:  IncredibleEmployment.be  This test is no t yet approved or cleared by the [place] FDA and  has been authorized for detection and/or diagnosis of SARS-CoV-2 by FDA under an Emergency Use Authorization (EUA). This EUA will remain  in effect (meaning this test can be used) for the duration of the COVID-19 declaration under Section 564(b)(1) of the Act, 21 U.S.C.section 360bbb-3(b)(1), unless the authorization is terminated  or revoked sooner.       Influenza Ciena Sampley by PCR NEGATIVE NEGATIVE Final   Influenza B by PCR NEGATIVE NEGATIVE Final    Comment: (NOTE) The Xpert Xpress SARS-CoV-2/FLU/RSV plus assay is intended as an aid in the diagnosis of influenza from Nasopharyngeal swab specimens and should not be used as Rieley Khalsa sole basis for treatment. Nasal washings and aspirates are unacceptable for Xpert Xpress SARS-CoV-2/FLU/RSV testing.  Fact Sheet for Patients: EntrepreneurPulse.com.au  Fact Sheet for Healthcare Providers: IncredibleEmployment.be  This test is not yet approved or cleared by the [place] FDA and has been authorized for detection and/or diagnosis of SARS-CoV-2 by FDA under  an Emergency Use Authorization (EUA). This EUA will remain in effect (meaning this test can be used) for the duration of the COVID-19 declaration under Section 564(b)(1) of the Act, 21 U.S.C. section 360bbb-3(b)(1), unless the authorization is terminated or revoked.  Performed at [facility], [place] [address] [place], [place] [address]   Culture, blood (Routine X 2) w Reflex to ID Panel     Status: None   Collection Time: 06/25/20 10:22 AM   Specimen: Site Not Specified; Blood  Result Value Ref Range Status   Specimen Description   Final    SITE NOT SPECIFIED Performed at [facility] [address] [place], [place] [address]    Special Requests   Final    BOTTLES DRAWN AEROBIC ONLY Blood Culture adequate volume Performed at [facility] [address], [place], [place] [address]    Culture   Final    NO GROWTH 5 DAYS Performed at [facility], [place] [address] [place], [place] [address]    Report Status 06/30/2020 FINAL  Final  Culture, blood (Routine X 2) w Reflex to ID Panel     Status: None   Collection Time: 06/25/20 12:06 PM   Specimen: BLOOD LEFT HAND  Result Value Ref Range Status   Specimen Description   Final    BLOOD LEFT HAND Performed at [facility] [address], [place], [place] [address]    Special Requests   Final    BOTTLES DRAWN AEROBIC ONLY Blood Culture adequate volume Performed at [facility] [address], [place], [place] [address]    Culture   Final    NO GROWTH 5 DAYS Performed at [facility], [place] [address] [place], [place] [address]    Report Status 06/30/2020  FINAL  Final         Radiology Studies: No results found.      Scheduled Meds: . sodium chloride   Intravenous Once  . allopurinol  100 mg Oral Daily  . diltiazem  120 mg Oral Daily  . mouth rinse  15 mL Mouth Rinse BID  . sodium chloride flush  10-40 mL  Intracatheter Q12H  . sodium chloride flush  3 mL Intravenous Q12H   Continuous Infusions: . HYDROmorphone 0.3 mg/hr (06/29/20 1138)     LOS: 7 days    Time spent: over 30 min    Fayrene Helper, MD [facility]   To contact the attending provider between 7A-7P or the covering provider during after hours 7P-7A, please log into the web site [email or web address] and access using universal [place] password for that web site. If you do not have the password, please call the hospital operator.  06/30/2020, 12:26 PM

## 2020-06-30 NOTE — Progress Notes (Signed)
Daily Progress Note   Patient Name: Natasha Ellis       Date: 06/30/2020 DOB: Dec 20, 1930  Age: 86 y.o. MRN#: [id number] Attending Physician: Elodia Florence., * Primary Care Physician: Lavone Orn, MD Admit Date: 06/22/2020  Reason for Consultation/Follow-up: Terminal Care  Subjective: Resting in bed.  Awake and alert, Thedore Mins who is visiting from [facility] is at bedside. Patient had a severe coughing spell when I was in the room, which exacerbated her pain. She is tearful at times, she is awake alert oriented, she is aware of transfer to hospice house, she remains focused on avoidance of pain and suffering to whatever extent possible.     Length of Stay: 7  Current Medications: Scheduled Meds:  . sodium chloride   Intravenous Once  . allopurinol  100 mg Oral Daily  . diltiazem  120 mg Oral Daily  . mouth rinse  15 mL Mouth Rinse BID  . sodium chloride flush  10-40 mL Intracatheter Q12H  . sodium chloride flush  3 mL Intravenous Q12H    Continuous Infusions: . HYDROmorphone 0.2 mg/hr (06/30/20 1237)    PRN Meds: acetaminophen **OR** acetaminophen, diclofenac Sodium, haloperidol lactate, HYDROmorphone, ondansetron **OR** ondansetron (ZOFRAN) IV, polyethylene glycol, sodium chloride flush  Physical Exam         Awake and alert Regular work of breathing, coarse breath sounds anteriorly.  Generalized edema S1-S2 Nonfocal Abdomen mildly distended  Vital Signs: BP 123/70 (BP Location: Left Arm)   Pulse 60   Temp 98.4 F (36.9 C) (Oral)   Resp 19   Ht 5\' 6"  (1.676 m)   Wt 126.9 kg   SpO2 97%   BMI 45.16 kg/m  SpO2: SpO2: 97 % O2 Device: O2 Device: Nasal Cannula O2 Flow Rate: O2 Flow Rate (L/min): 2 L/min  Intake/output summary:   Intake/Output Summary  (Last 24 hours) at 06/30/2020 1300 Last data filed at 06/30/2020 0900 Gross per 24 hour  Intake 133.77 ml  Output 500 ml  Net -366.23 ml   LBM: Last BM Date: 06/30/20 Baseline Weight: Weight: 121 kg Most recent weight: Weight: 126.9 kg       Palliative Assessment/Data:      Patient Active Problem List   Diagnosis Date Noted  . Pressure injury of skin 06/24/2020  . Sepsis due to  pneumonia ([place]) 06/23/2020  . Acute encephalopathy 06/23/2020  . Low grade B cell lymphoproliferative disorder ([place]) 06/05/2020  . Bone metastases ([place]) 06/05/2020  . Pneumonia 06/04/2020  . 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 Rectal bleeding 01/02/2020  . PAF (paroxysmal atrial fibrillation) ([place]) 01/02/2020  . Goals of care, counseling/discussion 11/14/2019  . Chronic kidney disease, stage 3b ([place]) 01/08/2019  . AKI (acute kidney injury) ([place]) 01/08/2019  . Atrial fibrillation with RVR ([place]) 01/07/2019  . Physical deconditioning 09/15/2018  . Bronchiectasis ([place]) 09/06/2018  . Lung nodule 09/06/2018  . Ascending aortic aneurysm ([place]) 09/06/2018  . Hypoxia 08/15/2016  . Severe obesity (BMI >= 40) ([place]) 07/30/2015  . Multifocal pneumonia 06/19/2015  . Anemia 06/10/2015  . Anemia due to other cause 06/10/2015  . OSA (obstructive sleep apnea) 09/20/2014  . History of lymphoma 09/20/2014  . History of TIA (transient ischemic attack) 09/20/2014  . Diarrhea   . Pain in gums 08/15/2014  . Neutropenia ([place]) 08/15/2014  . Hypersensitivity reaction 05/31/2014  . Waldenstrom's macroglobulinemia ([place]) 04/19/2014  . HTN (hypertension) 04/29/2013  . HLD (hyperlipidemia) 04/29/2013  . Lacunar infarction ([place]) 04/29/2013  . GERD (gastroesophageal reflux disease) 04/29/2013  . TIA (transient ischemic attack) 04/29/2013  . Back pain 04/29/2013  . Asthma 04/29/2013  . Dyspnea 01/25/2013  . Chronic cough 01/01/2013    Palliative Care Assessment & Plan    Patient Profile:    Assessment: 86 year old lady, patient of Dr. Benay Spice from medical oncology with life limiting illness of non-Hodgkin's lymphoma, Shea Evans macroglobulinemia, also with underlying medical conditions of atrial fibrillation, chronic kidney disease admitted with sepsis due to pneumonia, and has since been seen by palliative medicine team and transition to full scope of comfort measures, currently maintained on hydromorphone low-dose infusion.  Hospice has been contacted and patient is awaiting bed availability at local hospice facility.  Recommendations/Plan:  Continue to adjust opioids as needed for comfort measures.   Transfer to residential hospice when bed available.   Discussed with patient at bedside about pain and non pain symptom management options.    Goals of Care and Additional Recommendations:  Limitations on Scope of Treatment: Full Comfort Care  Code Status:    Code Status Orders  (From admission, onward)         Start     Ordered   06/23/20 0431  Do not attempt resuscitation (DNR)  Continuous       Question Answer Comment  In the event of cardiac or respiratory ARREST Do not call a "code blue"   In the event of cardiac or respiratory ARREST Do not perform Intubation, CPR, defibrillation or ACLS   In the event of cardiac or respiratory ARREST Use medication by any route, position, wound care, and other measures to relive pain and suffering. May use oxygen, suction and manual treatment of airway obstruction as needed for comfort.      06/23/20 0432        Code Status History    Date Active Date Inactive Code Status Order ID Comments User Context   06/02/2020 2134 06/12/2020 1936 DNR [id number]  Etta Quill, DO ED   01/02/2020 2240 01/06/2020 1822 DNR [id number]  Rise Patience, MD ED   01/07/2019 [phone] 01/12/2019 1612 DNR [id number]  Shela Leff, MD Inpatient   01/07/2019 0521 01/07/2019 0609 Full Code [id number]  Shela Leff,  MD Inpatient   08/26/2016 2146 08/28/2016 1724 Full Code [id number]  Reubin Milan, MD  Inpatient   08/15/2016 2023 08/18/2016 1914 Full Code [id number]  Lily Kocher, MD Inpatient   09/20/2014 0240 09/22/2014 1758 Full Code [id number]  Rise Patience, MD Inpatient   04/29/2013 0103 05/01/2013 2038 Full Code [id number]  Barton Dubois, MD ED   Advance Care Planning Activity    Advance Directive Documentation     Most Recent Value  Type of Advance Directive Living will, Out of facility DNR (pink MOST or yellow form), Healthcare Power of Attorney  Pre-existing out of facility DNR order (yellow form or pink MOST form) Yellow form placed in chart (order not valid for inpatient use)  "MOST" Form in Place? --       Prognosis:   < 2 weeks  Discharge Planning:  Hospice facility When there is a bed available.  [facility] and they are following.  Care plan was discussed with patient, [facility] MD,family member Thedore Mins who is visiting with her and interdisciplinary team.  Thank you for allowing the Palliative Medicine Team to assist in the care of this patient.   Time In: 12 Time Out: 12.35 Total Time 35 Prolonged Time Billed  no       Greater than 50%  of this time was spent counseling and coordinating care related to the above assessment and plan.  Loistine Chance, MD  Please contact Palliative Medicine Team phone at [phone] for questions and concerns.

## 2020-07-01 DIAGNOSIS — R52 Pain, unspecified: Secondary | ICD-10-CM | POA: Diagnosis not present

## 2020-07-01 DIAGNOSIS — J189 Pneumonia, unspecified organism: Secondary | ICD-10-CM | POA: Diagnosis not present

## 2020-07-01 DIAGNOSIS — Z7189 Other specified counseling: Secondary | ICD-10-CM | POA: Diagnosis not present

## 2020-07-01 DIAGNOSIS — Z515 Encounter for palliative care: Secondary | ICD-10-CM | POA: Diagnosis not present

## 2020-07-01 DIAGNOSIS — R531 Weakness: Secondary | ICD-10-CM

## 2020-07-01 DIAGNOSIS — A419 Sepsis, unspecified organism: Secondary | ICD-10-CM | POA: Diagnosis not present

## 2020-07-01 NOTE — Progress Notes (Signed)
Report called to [facility]. Requesting patient be sent with IVs in place, and a bolus of dilaudid given prior to transport. Awaiting EMS to transport. Will continue to monitor, patient resting comfortably at this time.

## 2020-07-01 NOTE — Care Management Important Message (Signed)
Important Message  Patient Details IM Letter given to the Patient. Name: Natasha Ellis MRN: [id number] Date of Birth: [birth year]   Medicare Important Message Given:  Yes     Kerin Salen 07/01/2020, 10:34 AM

## 2020-07-01 NOTE — Progress Notes (Signed)
Daily Progress Note   Patient Name: Natasha Ellis       Date: 07/01/2020 DOB: 1931-01-02  Age: 86 y.o. MRN#: [id number] Attending Physician: Elodia Florence., * Primary Care Physician: Lavone Orn, MD Admit Date: 06/22/2020  Reason for Consultation/Follow-up: Terminal Care  Subjective: Resting in bed.  Awakens easily, no distress, work of breathing is not laboed, she is not in pain. Asks for a sip of water, able to drink without coughing spells, asks when she would be going to hospice house.   I called and discussed briefly with daughter [name] on the phone, she is also awaiting potential transfer to Hospice.    Length of Stay: 8  Current Medications: Scheduled Meds:  . sodium chloride   Intravenous Once  . allopurinol  100 mg Oral Daily  . diltiazem  120 mg Oral Daily  . mouth rinse  15 mL Mouth Rinse BID  . sodium chloride flush  10-40 mL Intracatheter Q12H  . sodium chloride flush  3 mL Intravenous Q12H    Continuous Infusions: . HYDROmorphone 0.2 mg/hr (06/30/20 2044)    PRN Meds: acetaminophen **OR** acetaminophen, diclofenac Sodium, haloperidol lactate, HYDROmorphone, ondansetron **OR** ondansetron (ZOFRAN) IV, polyethylene glycol, sodium chloride flush  Physical Exam         Awake and alert Regular work of breathing, coarse breath sounds anteriorly.  Generalized edema S1-S2 Nonfocal Abdomen mildly distended  Vital Signs: BP 139/62 (BP Location: Left Arm)   Pulse (!) 113   Temp 98.8 F (37.1 C) (Oral)   Resp (!) 21   Ht 5\' 6"  (1.676 m)   Wt 126.9 kg   SpO2 95%   BMI 45.16 kg/m  SpO2: SpO2: 95 % O2 Device: O2 Device: Nasal Cannula O2 Flow Rate: O2 Flow Rate (L/min): 2 L/min  Intake/output summary:   Intake/Output Summary (Last 24 hours) at  07/01/2020 1024 Last data filed at 06/30/2020 2130 Gross per 24 hour  Intake 180 ml  Output 300 ml  Net -120 ml   LBM: Last BM Date: 06/30/20 Baseline Weight: Weight: 121 kg Most recent weight: Weight: 126.9 kg       Palliative Assessment/Data:      Patient Active Problem List   Diagnosis Date Noted  . Palliative care by specialist   . Generalized pain   .  Pressure injury of skin 06/24/2020  . Sepsis due to pneumonia ([place]) 06/23/2020  . Acute encephalopathy 06/23/2020  . Low grade B cell lymphoproliferative disorder ([place]) 06/05/2020  . Bone metastases ([place]) 06/05/2020  . Pneumonia 06/04/2020  . 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 Rectal bleeding 01/02/2020  . PAF (paroxysmal atrial fibrillation) ([place]) 01/02/2020  . Goals of care, counseling/discussion 11/14/2019  . Chronic kidney disease, stage 3b ([place]) 01/08/2019  . AKI (acute kidney injury) ([place]) 01/08/2019  . Atrial fibrillation with RVR ([place]) 01/07/2019  . Physical deconditioning 09/15/2018  . Bronchiectasis ([place]) 09/06/2018  . Lung nodule 09/06/2018  . Ascending aortic aneurysm ([place]) 09/06/2018  . Hypoxia 08/15/2016  . Severe obesity (BMI >= 40) ([place]) 07/30/2015  . Multifocal pneumonia 06/19/2015  . Anemia 06/10/2015  . Anemia due to other cause 06/10/2015  . OSA (obstructive sleep apnea) 09/20/2014  . History of lymphoma 09/20/2014  . History of TIA (transient ischemic attack) 09/20/2014  . Diarrhea   . Pain in gums 08/15/2014  . Neutropenia ([place]) 08/15/2014  . Hypersensitivity reaction 05/31/2014  . Waldenstrom's macroglobulinemia ([place]) 04/19/2014  . HTN (hypertension) 04/29/2013  . HLD (hyperlipidemia) 04/29/2013  . Lacunar infarction ([place]) 04/29/2013  . GERD (gastroesophageal reflux disease) 04/29/2013  . TIA (transient ischemic attack) 04/29/2013  . Back pain 04/29/2013  . Asthma 04/29/2013  . Dyspnea 01/25/2013  . Chronic cough 01/01/2013     Palliative Care Assessment & Plan   Patient Profile:    Assessment: 86 year old lady, patient of Dr. Benay Spice from medical oncology with life limiting illness of non-Hodgkin's lymphoma, Shea Evans macroglobulinemia, also with underlying medical conditions of atrial fibrillation, chronic kidney disease admitted with sepsis due to pneumonia, and has since been seen by palliative medicine team and transition to full scope of comfort measures, currently maintained on hydromorphone low-dose infusion.  Hospice has been contacted and patient is awaiting bed availability at local hospice facility.  Recommendations/Plan:  Continue to adjust opioids as needed for comfort measures. Currently on 0.2 mg IV Dilaudid infusion, she appears comfortable on current settings, discussed with daughter [name] on the phone.   Transfer to residential hospice when bed available.    Goals of Care and Additional Recommendations:  Limitations on Scope of Treatment: Full Comfort Care  Code Status:    Code Status Orders  (From admission, onward)         Start     Ordered   06/23/20 0431  Do not attempt resuscitation (DNR)  Continuous       Question Answer Comment  In the event of cardiac or respiratory ARREST Do not call a "code blue"   In the event of cardiac or respiratory ARREST Do not perform Intubation, CPR, defibrillation or ACLS   In the event of cardiac or respiratory ARREST Use medication by any route, position, wound care, and other measures to relive pain and suffering. May use oxygen, suction and manual treatment of airway obstruction as needed for comfort.      06/23/20 0432        Code Status History    Date Active Date Inactive Code Status Order ID Comments User Context   06/02/2020 2134 06/12/2020 1936 DNR [id number]  Etta Quill, DO ED   01/02/2020 2240 01/06/2020 1822 DNR [id number]  Rise Patience, MD ED   01/07/2019 [phone] 01/12/2019 1612 DNR [id number]  Shela Leff, MD  Inpatient   01/07/2019 0521 01/07/2019 0609 Full Code [id number]  Shela Leff, MD  Inpatient   08/26/2016 2146 08/28/2016 1724 Full Code [id number]  Reubin Milan, MD Inpatient   08/15/2016 2023 08/18/2016 1914 Full Code [id number]  Lily Kocher, MD Inpatient   09/20/2014 0240 09/22/2014 1758 Full Code [id number]  Rise Patience, MD Inpatient   04/29/2013 0103 05/01/2013 2038 Full Code [id number]  Barton Dubois, MD ED   Advance Care Planning Activity    Advance Directive Documentation     Most Recent Value  Type of Advance Directive Living will, Out of facility DNR (pink MOST or yellow form), Healthcare Power of Attorney  Pre-existing out of facility DNR order (yellow form or pink MOST form) Yellow form placed in chart (order not valid for inpatient use)  "MOST" Form in Place? --       Prognosis:   < 2 weeks  Discharge Planning:  Hospice facility When there is a bed available.  [facility] and they are following.  Care plan was discussed with patient, daughter [name] on the phone.   Thank you for allowing the Palliative Medicine Team to assist in the care of this patient.   Time In: 9.30 Time Out: 9.55 Total Time 25 Prolonged Time Billed  no       Greater than 50%  of this time was spent counseling and coordinating care related to the above assessment and plan.  Loistine Chance, MD  Please contact Palliative Medicine Team phone at [phone] for questions and concerns.

## 2020-07-01 NOTE — TOC Progression Note (Signed)
Transition of Care [facility]) - Progression Note    Patient Details  Name: JENISHA FAISON MRN: [id number] Date of Birth: [birth year]  [facility] CM/SW Contact  Ross Ludwig, [place] Phone Number: 07/01/2020, 9:42 AM  Clinical Narrative:     CSW was informed that [facility] has a bed available for patient to discharge today once paperwork is completed at [facility].  Nurse liasion will contact CSW once paperwork is ready in order to facilitate discharge planning.     Expected Discharge Plan and [facility] hospice facility.         Expected Discharge Date: 07/01/20                                     Social Determinants of Health (SDOH) Interventions    Readmission Risk Interventions Readmission Risk Prevention Plan 06/12/2020 01/09/2019 01/09/2019  Transportation Screening Complete Complete Complete  PCP or Specialist Appt within 3-5 Days Complete Not Complete Not Complete  Not Complete comments - not ready for d/c -  [place] or Home Care Consult Complete Complete -  Social Work Consult for [place] Planning/Counseling Complete Complete -  Palliative Care Screening Not Applicable Not Applicable -  Medication Review Press photographer) Complete Complete -  Some recent data might be hidden

## 2020-07-01 NOTE — Progress Notes (Signed)
Seen and evaluated at bedside. See discharge summary from 11/29 for additional details 86 yo with non hodgkins lymphoma, waldenstroms, [place] fib, etc who presented with multifocal pneumonia, now has been transitioned to comfort care with plan for inpatient hospice.  She'll d/c today when bed is available and they have what they need for dilaudid gtt.  She's eager to transfer to hospice, happy that this is happening today Daughter at bedside, discussed with her as well  Today's Vitals   06/30/20 0800 06/30/20 0830 06/30/20 2012 06/30/20 2015  BP:   139/62   Pulse:   (!) 113   Resp:   (!) 21   Temp:   98.8 F (37.1 C)   TempSrc:   Oral   SpO2:   95%   Weight:      Height:      PainSc: 10-Worst pain ever Asleep  10-Worst pain ever   Body mass index is 45.16 kg/m. Limited exam for comfort NAD Unlabored breathing Warm and dry  Plan for transfer to [facility] when bed available and they're ready.

## 2020-07-01 NOTE — Progress Notes (Signed)
[facility] Liaison Note:  Patient family and [place] Worker plan to meet to complete [employer] paperwork this morning at hospital. This note will be updated once consents are done and patient ready to be transferred to [facility]. TOC made aware.  RN please call report to [phone].   Thank you,    Gar Ponto, RN   [facility] Liaison  (listed on AMION under Hospice and [place] [facility] [place])   [phone]

## 2020-07-01 NOTE — TOC Transition Note (Addendum)
Transition of Care [facility]) - CM/SW Discharge Note   Patient Details  Name: Natasha Ellis MRN: [id number] Date of Birth: 27-Jan-1931  [facility] of [facility]) CM/SW Contact:  Ross Ludwig, LCSW Phone Number: 07/01/2020, 10:51 AM   Clinical Narrative:     Patient to be d/c'ed today to [facility].  Patient and family agreeable to plans will transport via ems RN to call report to [phone].  Patient's daughter aware that patient is discharging today.      Final next level of care: [place] Barriers to Discharge: Barriers Resolved   Patient Goals and CMS Choice Patient states their goals for this hospitalization and ongoing recovery are:: To go to [facility] for end of life care. CMS Medicare.gov Compare Post Acute Care list provided to:: Patient Represenative (must comment) Choice offered to / list presented to : Adult Children  Discharge Placement              Patient chooses bed at: Other - please specify in the comment section below: ([facility]) Patient to be transferred to facility by: [facility] Name of family member notified: [name] Patient's daughter Patient and family notified of of transfer: 07/01/20  Discharge Plan and Services                                     Social Determinants of Health (SDOH) Interventions     Readmission Risk Interventions Readmission Risk Prevention Plan 06/12/2020 01/09/2019 01/09/2019  Transportation Screening Complete Complete Complete  PCP or Specialist Appt within 3-5 Days Complete Not Complete Not Complete  Not Complete comments - not ready for d/c -  [place] or Home Care Consult Complete Complete -  Social Work Consult for [place] Planning/Counseling Complete Complete -  Palliative Care Screening Not Applicable Not Applicable -  Medication Review Press photographer) Complete Complete -  Some recent data might be hidden

## 2020-07-01 NOTE — Progress Notes (Signed)
Wasted 65mL of Dilaudid gtt in stericycle witnessed by [place], Therapist, sports.

## 2020-07-08 ENCOUNTER — Telehealth: Payer: Self-pay | Admitting: Radiation Oncology

## 2020-07-08 NOTE — Telephone Encounter (Signed)
  Radiation Oncology         [phone]) [phone] ________________________________  Name: Natasha Ellis MRN: [id number]  Date of Service: 07/08/2020  DOB: Mar 16, 1931  Post Treatment Telephone Note  Diagnosis:   Low Grade B-cell Non-Hodgkin's Lymphoma involving the bones  Interval Since Last Radiation: 4 weeks   06/06/20-06/12/20; The target in the pelvis was treated to 20 Gy in 5 fractions  Narrative:  The patient was contacted today for routine follow-up. During treatment she did very well with radiotherapy and did not have significant desquamation. She had terrible pain in laying on the table and had to have premedication prior to treatment to help make the treatment positioning tolerable. She was able to complete her course. She has decided to proceed with hospice care and is under their care now at this time.  Impression/Plan: 1. Low Grade B-cell Non-Hodgkin's Lymphoma involving the bones. The patient has been doing poorly overall and is not expected to live more than a few more days. I expressed my condolences in this difficult time. We will be available as needed to her daughter moving forward.    Carola Rhine, PAC

## 2020-07-11 ENCOUNTER — Ambulatory Visit: Payer: Medicare Other

## 2020-07-11 ENCOUNTER — Ambulatory Visit: Payer: Medicare Other | Admitting: Oncology

## 2020-07-11 ENCOUNTER — Other Ambulatory Visit: Payer: Medicare Other

## 2020-08-03 DEATH — deceased

## 2020-08-30 NOTE — Progress Notes (Signed)
  Radiation Oncology         [phone] ________________________________  Name: Natasha Ellis MRN: [id number]  Date: 06/12/2020  DOB: [birth year]  End of Treatment Note  Diagnosis:   Low Grade B-cell Non-Hodgkin's Lymphoma      Indication for treatment::  palliative       Radiation treatment dates:   06/06/20 - 06/12/20  Site/dose:   The left pelvis was treated to the left pelvis to a dose of 20 Gy in 5 fractions using a 4-field technique.  Narrative: The patient tolerated radiation treatment relatively well.     Plan: The patient has completed radiation treatment. The patient will return to radiation oncology clinic for routine followup in one month. I advised the patient to call or return sooner if they have any questions or concerns related to their recovery or treatment. ________________________________  Jodelle Gross, M.D., Ph.D.

## 2021-11-17 IMAGING — CR DG WRIST 2V*R*
2 series · 2 of 2 positions shown · non-contrast
Comparison: None.

CLINICAL DATA: Chronic right wrist and thumb pain. No known injury.

EXAM:
RIGHT HAND - 2 VIEW; RIGHT WRIST - 2 VIEW

[x wrist pa right]
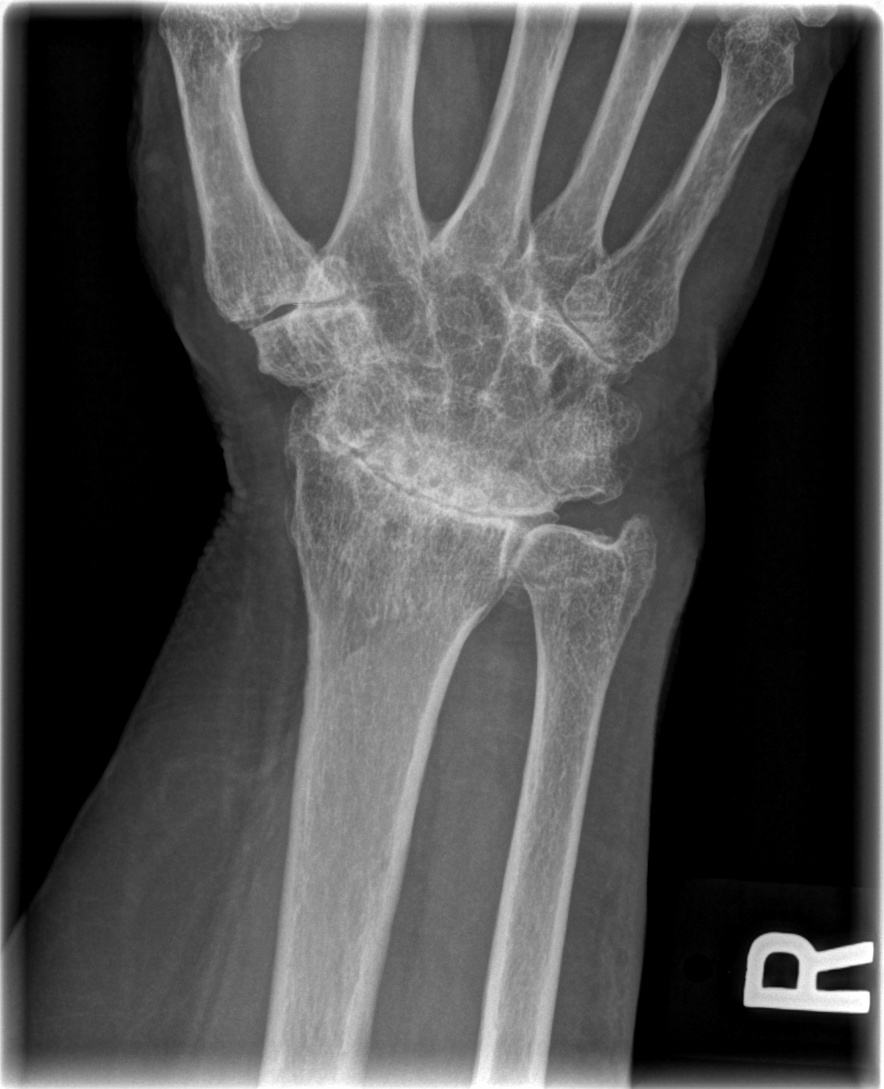

[x wrist lat right]
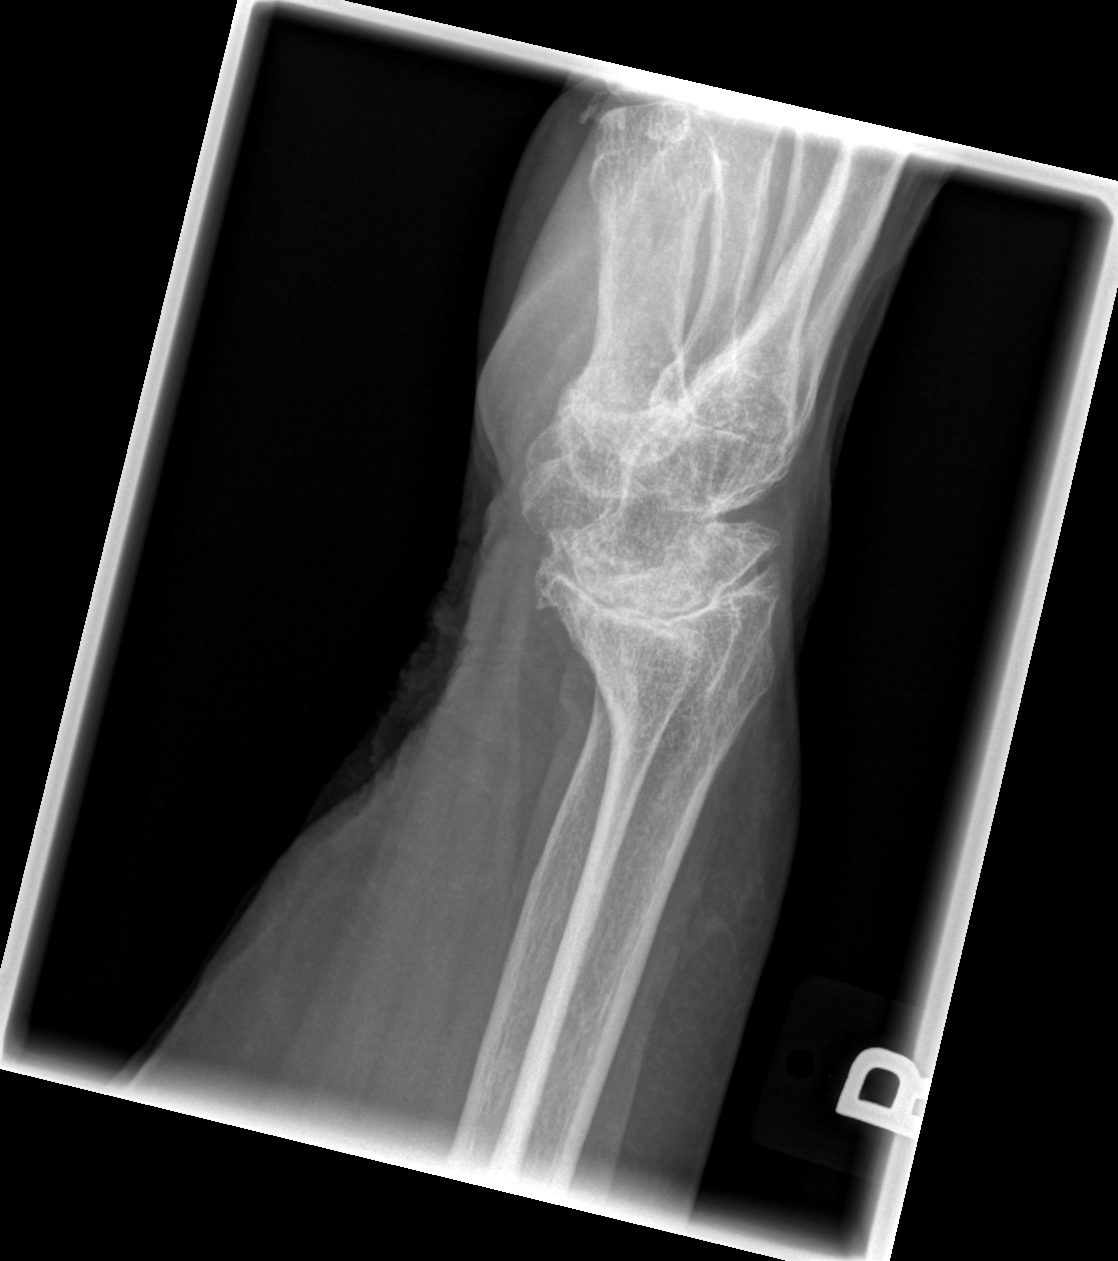

[2 of 2 positions shown; findings below may reference images not displayed]

FINDINGS: There is severe carpal crowding and all of the carpal bones are
ankylosed. Severe radiocarpal joint space narrowing with little to
no bony proliferative change is present. There is scattered
interphalangeal joint space narrowing and osteophytosis, worst at
the IP joint of the thumb and DIP joint of the index finger.
Mineralization and alignment are normal. No fracture or dislocation.
No chondrocalcinosis.
IMPRESSION: Abnormal appearance of the wrist is most consistent with chronic
inflammatory arthropathy such as rheumatoid.

Scattered interphalangeal joint osteoarthritis.

No acute finding.

## 2021-11-17 IMAGING — CR DG HAND 2V*R*
2 series · 2 of 2 positions shown · non-contrast
Comparison: None.

CLINICAL DATA: Chronic right wrist and thumb pain. No known injury.

EXAM:
RIGHT HAND - 2 VIEW; RIGHT WRIST - 2 VIEW

[x hand pa right]
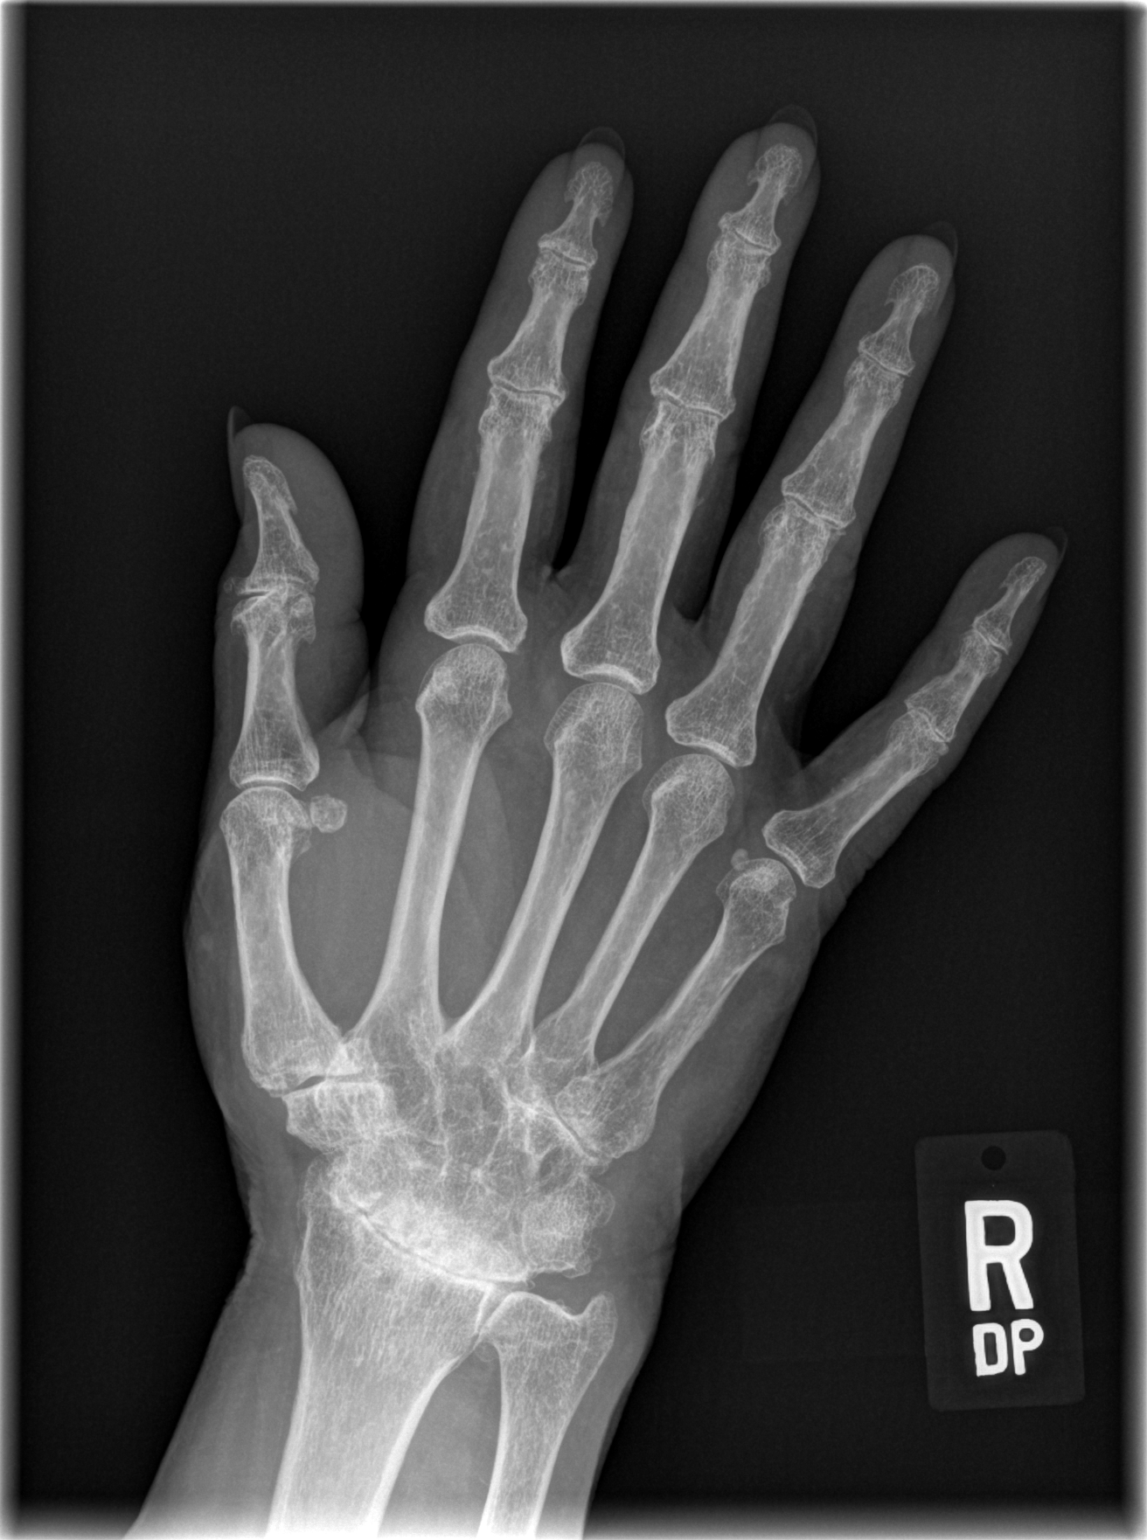

[x hand lat right]
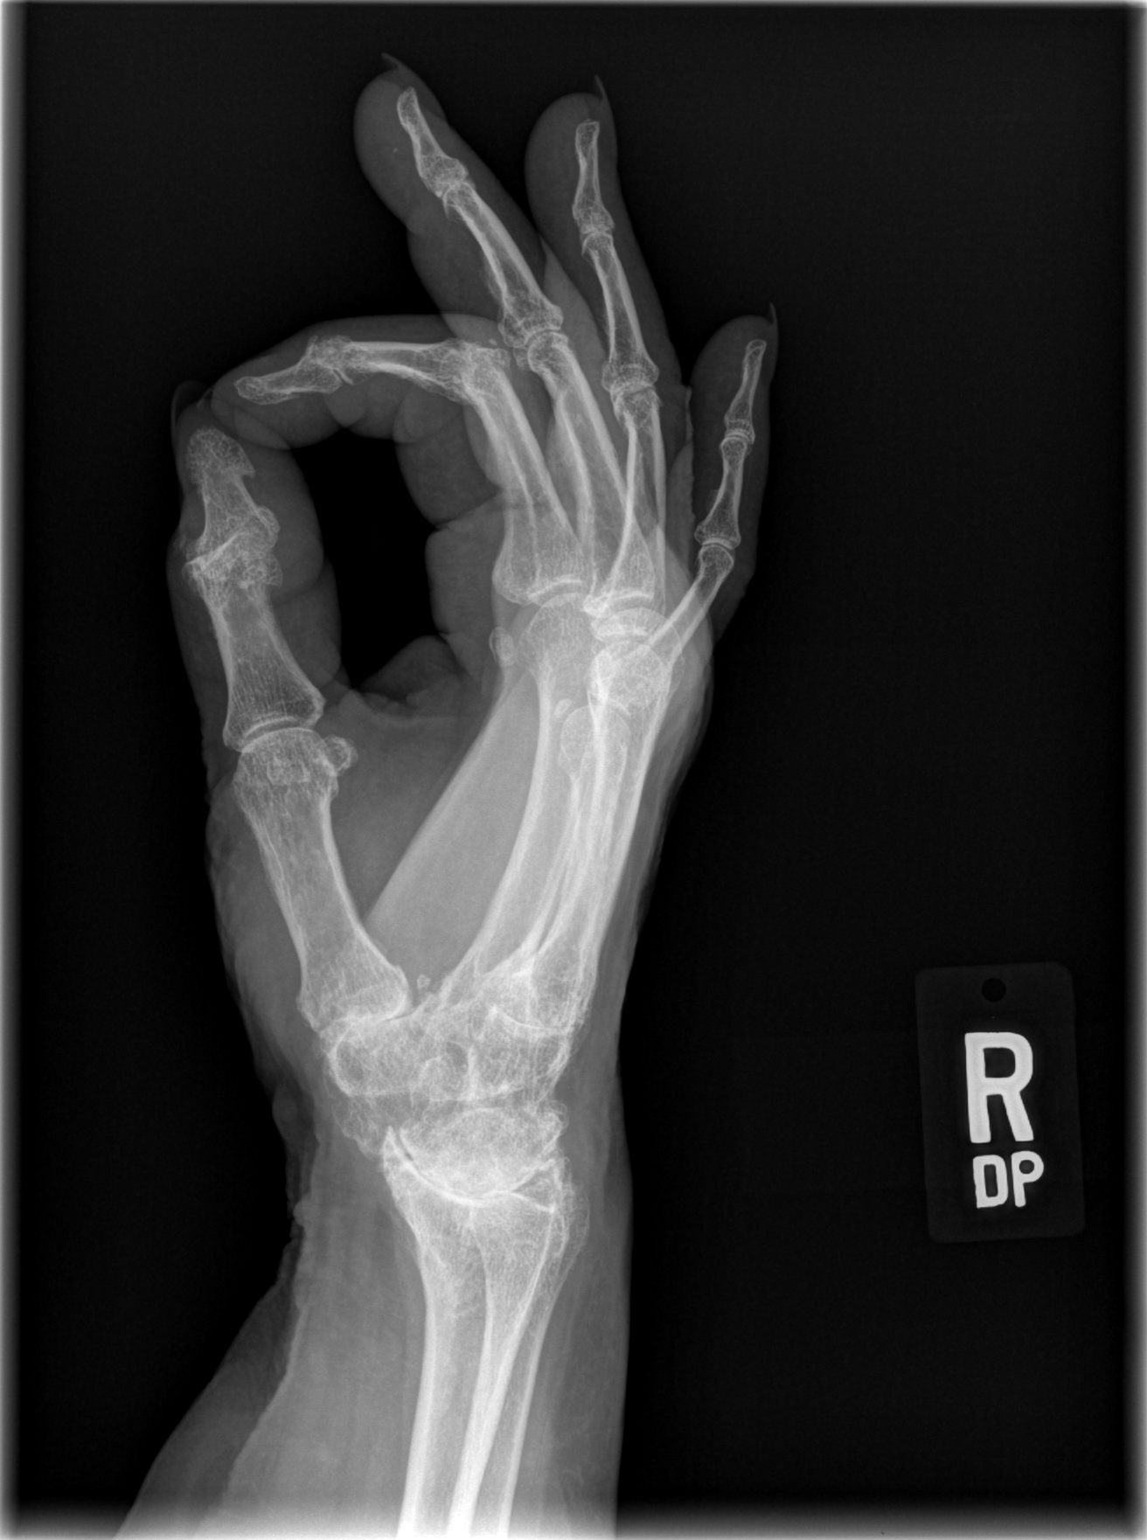

[2 of 2 positions shown; findings below may reference images not displayed]

FINDINGS: There is severe carpal crowding and all of the carpal bones are
ankylosed. Severe radiocarpal joint space narrowing with little to
no bony proliferative change is present. There is scattered
interphalangeal joint space narrowing and osteophytosis, worst at
the IP joint of the thumb and DIP joint of the index finger.
Mineralization and alignment are normal. No fracture or dislocation.
No chondrocalcinosis.
IMPRESSION: Abnormal appearance of the wrist is most consistent with chronic
inflammatory arthropathy such as rheumatoid.

Scattered interphalangeal joint osteoarthritis.

No acute finding.

## 2022-01-26 IMAGING — DX DG CHEST 1V
1 series · 1 of 1 positions shown · non-contrast
Comparison: 06/02/2020

CLINICAL DATA: Metastatic disease.  Shortness of breath

EXAM:
CHEST  1 VIEW

[chest ap]
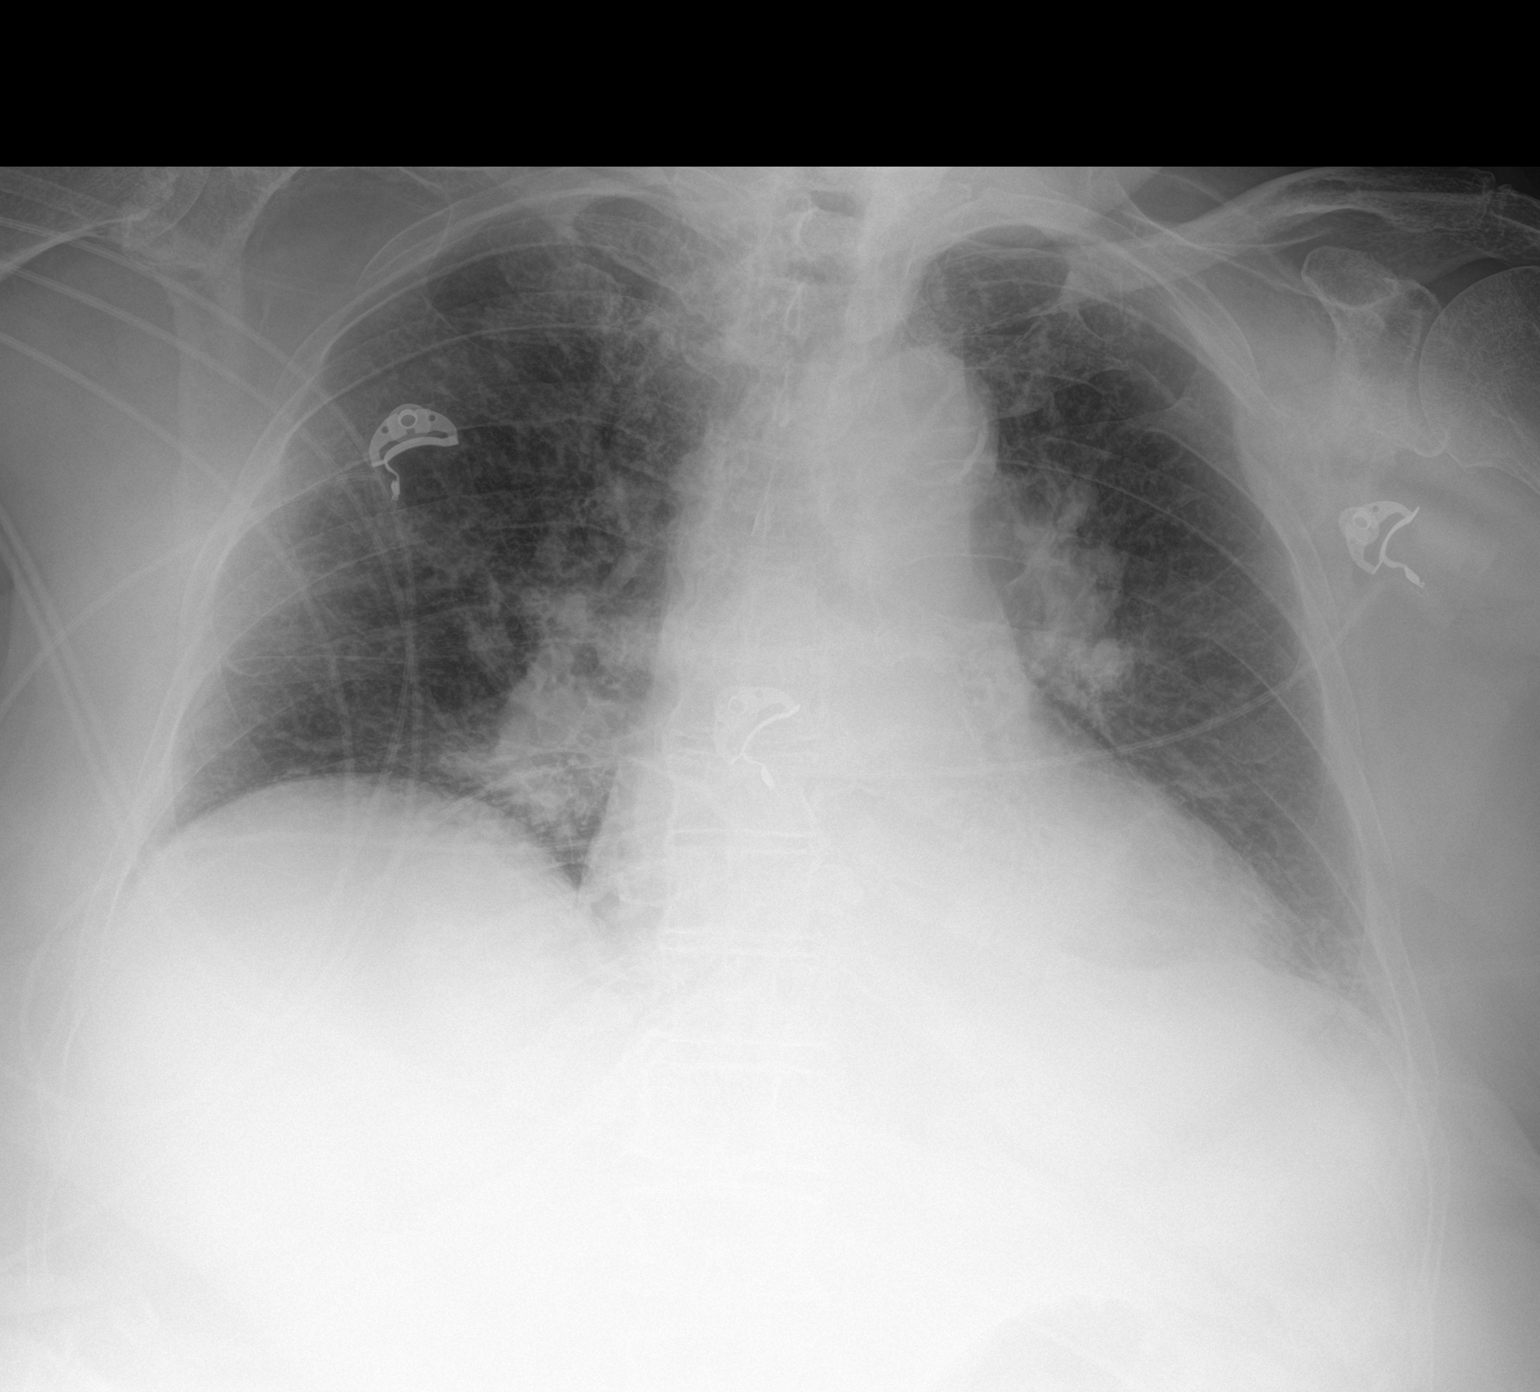

[1 of 1 positions shown; findings below may reference images not displayed]

FINDINGS: Heart size is stable. There is no pneumothorax. No large pleural
effusion. There is no definite focal infiltrate. Expansile left rib
lesion is again noted. The pulmonary vasculature appears dilated.
There is suggestion of bilateral hilar adenopathy. Aortic
calcifications are noted.
IMPRESSION: Cardiomegaly with vascular congestion. No definite focal infiltrate.
Additional chronic findings as detailed above, likely related to the
patient's reported history of metastatic disease.

## 2023-05-19 NOTE — Telephone Encounter (Signed)
TC

## 2024-02-23 NOTE — Telephone Encounter (Signed)
 error
